# Patient Record
Sex: Female | Born: 1945
Health system: Southern US, Community
[De-identification: ages and names within clinical notes are randomized; demographics above are authoritative.]

## PROBLEM LIST (undated history)

## (undated) DIAGNOSIS — I471 Supraventricular tachycardia: Secondary | ICD-10-CM

## (undated) DIAGNOSIS — Z923 Personal history of irradiation: Secondary | ICD-10-CM

## (undated) DIAGNOSIS — Z9221 Personal history of antineoplastic chemotherapy: Secondary | ICD-10-CM

## (undated) DIAGNOSIS — M1990 Unspecified osteoarthritis, unspecified site: Secondary | ICD-10-CM

## (undated) DIAGNOSIS — T7840XA Allergy, unspecified, initial encounter: Secondary | ICD-10-CM

## (undated) DIAGNOSIS — M8580 Other specified disorders of bone density and structure, unspecified site: Secondary | ICD-10-CM

## (undated) DIAGNOSIS — I82409 Acute embolism and thrombosis of unspecified deep veins of unspecified lower extremity: Secondary | ICD-10-CM

## (undated) DIAGNOSIS — D689 Coagulation defect, unspecified: Secondary | ICD-10-CM

## (undated) DIAGNOSIS — J189 Pneumonia, unspecified organism: Secondary | ICD-10-CM

## (undated) DIAGNOSIS — Z9889 Other specified postprocedural states: Secondary | ICD-10-CM

## (undated) DIAGNOSIS — R112 Nausea with vomiting, unspecified: Secondary | ICD-10-CM

## (undated) DIAGNOSIS — D649 Anemia, unspecified: Secondary | ICD-10-CM

## (undated) DIAGNOSIS — I890 Lymphedema, not elsewhere classified: Secondary | ICD-10-CM

## (undated) DIAGNOSIS — K219 Gastro-esophageal reflux disease without esophagitis: Secondary | ICD-10-CM

## (undated) DIAGNOSIS — J45909 Unspecified asthma, uncomplicated: Secondary | ICD-10-CM

## (undated) DIAGNOSIS — I4719 Other supraventricular tachycardia: Secondary | ICD-10-CM

## (undated) DIAGNOSIS — Z8719 Personal history of other diseases of the digestive system: Secondary | ICD-10-CM

## (undated) DIAGNOSIS — I499 Cardiac arrhythmia, unspecified: Secondary | ICD-10-CM

## (undated) DIAGNOSIS — M719 Bursopathy, unspecified: Secondary | ICD-10-CM

## (undated) DIAGNOSIS — C50919 Malignant neoplasm of unspecified site of unspecified female breast: Secondary | ICD-10-CM

## (undated) DIAGNOSIS — Z9289 Personal history of other medical treatment: Secondary | ICD-10-CM

## (undated) DIAGNOSIS — T781XXA Other adverse food reactions, not elsewhere classified, initial encounter: Secondary | ICD-10-CM

## (undated) HISTORY — PX: CHALAZION EXCISION: SHX213

## (undated) HISTORY — DX: Malignant neoplasm of unspecified site of unspecified female breast: C50.919

## (undated) HISTORY — DX: Coagulation defect, unspecified: D68.9

## (undated) HISTORY — PX: TONSILLECTOMY: SUR1361

## (undated) HISTORY — DX: Acute embolism and thrombosis of unspecified deep veins of unspecified lower extremity: I82.409

## (undated) HISTORY — PX: DILATION AND CURETTAGE OF UTERUS: SHX78

## (undated) HISTORY — PX: TUBAL LIGATION: SHX77

## (undated) HISTORY — PX: MASTECTOMY MODIFIED RADICAL: SUR848

## (undated) HISTORY — PX: COLONOSCOPY: SHX174

## (undated) HISTORY — DX: Personal history of antineoplastic chemotherapy: Z92.21

## (undated) HISTORY — PX: BREAST BIOPSY: SHX20

---

## 1968-12-01 HISTORY — PX: TUMOR EXCISION: SHX421

## 1979-08-02 HISTORY — PX: CHALAZION EXCISION: SHX213

## 1998-06-07 ENCOUNTER — Other Ambulatory Visit: Admission: RE | Admit: 1998-06-07 | Discharge: 1998-06-07 | Payer: Self-pay | Admitting: Obstetrics and Gynecology

## 1999-08-29 ENCOUNTER — Other Ambulatory Visit: Admission: RE | Admit: 1999-08-29 | Discharge: 1999-08-29 | Payer: Self-pay | Admitting: *Deleted

## 1999-10-07 ENCOUNTER — Other Ambulatory Visit: Admission: RE | Admit: 1999-10-07 | Discharge: 1999-10-07 | Payer: Self-pay | Admitting: *Deleted

## 2000-09-18 ENCOUNTER — Other Ambulatory Visit: Admission: RE | Admit: 2000-09-18 | Discharge: 2000-09-18 | Payer: Self-pay | Admitting: *Deleted

## 2001-10-29 ENCOUNTER — Other Ambulatory Visit: Admission: RE | Admit: 2001-10-29 | Discharge: 2001-10-29 | Payer: Self-pay | Admitting: *Deleted

## 2003-02-20 ENCOUNTER — Other Ambulatory Visit: Admission: RE | Admit: 2003-02-20 | Discharge: 2003-02-20 | Payer: Self-pay | Admitting: Obstetrics and Gynecology

## 2012-05-04 DIAGNOSIS — N61 Mastitis without abscess: Secondary | ICD-10-CM | POA: Diagnosis not present

## 2012-05-19 ENCOUNTER — Encounter (HOSPITAL_COMMUNITY): Payer: Self-pay | Admitting: *Deleted

## 2012-05-19 ENCOUNTER — Emergency Department (HOSPITAL_COMMUNITY)
Admission: EM | Admit: 2012-05-19 | Discharge: 2012-05-19 | Disposition: A | Discharge status: Home / Self Care | Payer: Medicare Other | Attending: Emergency Medicine | Admitting: Emergency Medicine

## 2012-05-19 DIAGNOSIS — E860 Dehydration: Secondary | ICD-10-CM | POA: Diagnosis not present

## 2012-05-19 DIAGNOSIS — R002 Palpitations: Secondary | ICD-10-CM | POA: Diagnosis not present

## 2012-05-19 DIAGNOSIS — T887XXA Unspecified adverse effect of drug or medicament, initial encounter: Secondary | ICD-10-CM | POA: Diagnosis not present

## 2012-05-19 DIAGNOSIS — R112 Nausea with vomiting, unspecified: Secondary | ICD-10-CM | POA: Diagnosis not present

## 2012-05-19 DIAGNOSIS — R5381 Other malaise: Secondary | ICD-10-CM | POA: Diagnosis not present

## 2012-05-19 DIAGNOSIS — R5383 Other fatigue: Secondary | ICD-10-CM | POA: Diagnosis not present

## 2012-05-19 DIAGNOSIS — Z79899 Other long term (current) drug therapy: Secondary | ICD-10-CM | POA: Insufficient documentation

## 2012-05-19 DIAGNOSIS — D649 Anemia, unspecified: Secondary | ICD-10-CM | POA: Diagnosis not present

## 2012-05-19 DIAGNOSIS — D696 Thrombocytopenia, unspecified: Secondary | ICD-10-CM | POA: Insufficient documentation

## 2012-05-19 DIAGNOSIS — R197 Diarrhea, unspecified: Secondary | ICD-10-CM | POA: Diagnosis not present

## 2012-05-19 HISTORY — DX: Unspecified asthma, uncomplicated: J45.909

## 2012-05-19 LAB — BASIC METABOLIC PANEL
BUN: 13 mg/dL (ref 6–23)
Chloride: 92 mEq/L — ABNORMAL LOW (ref 96–112)
GFR calc Af Amer: >90 mL/min (ref >90)
Potassium: 3.1 mEq/L — ABNORMAL LOW (ref 3.5–5.1)
Sodium: 129 mEq/L — ABNORMAL LOW (ref 135–145)

## 2012-05-19 LAB — CBC
HCT: 33.6 % — ABNORMAL LOW (ref 36.0–46.0)
Platelets: 59 10*3/uL — ABNORMAL LOW (ref 150–400)
RDW: 12.5 % (ref 11.5–15.5)
WBC: 6.7 10*3/uL (ref 4.0–10.5)

## 2012-05-19 MED ORDER — POTASSIUM CHLORIDE CRYS ER 20 MEQ PO TBCR
40.0000 meq | EXTENDED_RELEASE_TABLET | Freq: Once | ORAL | Status: AC
  Administered 2012-05-19: 40 meq via ORAL
  Filled 2012-05-19: qty 2

## 2012-05-19 MED ORDER — SODIUM CHLORIDE 0.9 % IV BOLUS (SEPSIS)
2000.0000 mL | Freq: Once | INTRAVENOUS | Status: AC
  Administered 2012-05-19: 2000 mL via INTRAVENOUS

## 2012-05-19 MED ORDER — SODIUM CHLORIDE 0.9 % IV BOLUS (SEPSIS): 1000.0000 mL | Freq: Once | INTRAVENOUS | Status: DC

## 2012-05-19 MED ORDER — ONDANSETRON HCL 4 MG/2ML IJ SOLN
4.0000 mg | Freq: Once | INTRAMUSCULAR | Status: AC
  Administered 2012-05-19: 4 mg via INTRAVENOUS
  Filled 2012-05-19: qty 2

## 2012-05-19 MED ORDER — ONDANSETRON 8 MG PO TBDP: 8.0000 mg | ORAL_TABLET | Freq: Three times a day (TID) | ORAL | Status: DC | PRN

## 2012-05-19 NOTE — ED Notes (Signed)
Pt ambulatory without assistance to restroom. Pt family at bedside

## 2012-05-19 NOTE — ED Notes (Signed)
Pt reports that her PCP placed her on bactrim for breast infection. Pt was to take 10 days of this medication but could only tolerate 8 days. Pt stopped taking Bactrim last Wednesday and has felt generally unwell. Pt reports feeling tired and "foggy."  Pt reports N/V/D worse this weekend. Pt states she has diarrhea about two times a day,continues to be nauseated and has had a decreased appetite. Pt denies vomiting today. Pt reports diarrhea looks black, but pt has tried [employer] recently for symptoms.  Pt saw PCP this AM and is here for further work up and treatment. Pt reports breast infection has cleared. Pt denies cough, shortness of breath or headache.

## 2012-05-19 NOTE — ED Notes (Signed)
Pt reports n/v/d that began last Wednesday 05/12/2012. Reports taking Bactrum x8 days prior to n/v/d, believes this was a contributing factor to symptoms. Denies fever. Pepto-bismal taken without relief.

## 2012-05-19 NOTE — Discharge Instructions (Signed)
Dehydration, Adult Dehydration is when you lose more fluids from the body than you take in. Vital organs like the kidneys, brain, and heart cannot function without a proper amount of fluids and salt. Any loss of fluids from the body can cause dehydration.  CAUSES   Vomiting.   Diarrhea.   Excessive sweating.   Excessive urine output.   Fever.  SYMPTOMS  Mild dehydration  Thirst.   Dry lips.   Slightly dry mouth.  Moderate dehydration  Very dry mouth.   Sunken eyes.   Skin does not bounce back quickly when lightly pinched and released.   Dark urine and decreased urine production.   Decreased tear production.   Headache.  Severe dehydration  Very dry mouth.   Extreme thirst.   Rapid, weak pulse (more than 100 beats per minute at rest).   Cold hands and feet.   Not able to sweat in spite of heat and temperature.   Rapid breathing.   Blue lips.   Confusion and lethargy.   Difficulty being awakened.   Minimal urine production.   No tears.  DIAGNOSIS  Your caregiver will diagnose dehydration based on your symptoms and your exam. Blood and urine tests will help confirm the diagnosis. The diagnostic evaluation should also identify the cause of dehydration. TREATMENT  Treatment of mild or moderate dehydration can often be done at home by increasing the amount of fluids that you drink. It is best to drink small amounts of fluid more often. Drinking too much at one time can make vomiting worse. Refer to the home care instructions below. Severe dehydration needs to be treated at the hospital where you will probably be given intravenous (IV) fluids that contain water and electrolytes. HOME CARE INSTRUCTIONS   Ask your caregiver about specific rehydration instructions.   Drink enough fluids to keep your urine clear or pale yellow.   Drink small amounts frequently if you have nausea and vomiting.   Eat as you normally do.   Avoid:   Foods or drinks high in  sugar.   Carbonated drinks.   Juice.   Extremely hot or cold fluids.   Drinks with caffeine.   Fatty, greasy foods.   Alcohol.   Tobacco.   Overeating.   Gelatin desserts.   Wash your hands well to avoid spreading bacteria and viruses.   Only take over-the-counter or prescription medicines for pain, discomfort, or fever as directed by your caregiver.   Ask your caregiver if you should continue all prescribed and over-the-counter medicines.   Keep all follow-up appointments with your caregiver.  SEEK MEDICAL CARE IF:  You have abdominal pain and it increases or stays in one area (localizes).   You have a rash, stiff neck, or severe headache.   You are irritable, sleepy, or difficult to awaken.   You are weak, dizzy, or extremely thirsty.  SEEK IMMEDIATE MEDICAL CARE IF:   You are unable to keep fluids down or you get worse despite treatment.   You have frequent episodes of vomiting or diarrhea.   You have blood or green matter (bile) in your vomit.   You have blood in your stool or your stool looks black and tarry.   You have not urinated in 6 to 8 hours, or you have only urinated a small amount of very dark urine.   You have a fever.   You faint.  MAKE SURE YOU:   Understand these instructions.   Will watch your condition.     Will get help right away if you are not doing well or get worse.  Document Released: 11/17/2005 Document Revised: 11/06/2011 Document Reviewed: 07/07/2011 ExitCare Patient Information 2012 [employer]. 

## 2012-05-19 NOTE — ED Notes (Signed)
PO trial started. Pt given something to drink and saltine crackers. Will reassess,.

## 2012-05-19 NOTE — ED Provider Notes (Signed)
History     CSN: [id number]  Arrival date & time 05/19/12  1050   First MD Initiated Contact with Patient 05/19/12 1116      Chief Complaint  Patient presents with  . Emesis  . Diarrhea    HPI  Patient presents from her PCP with 10 days of malaise and 3-5 days of nausea, vomiting, and diarrhea. She was placed on 10 days of bactrim two weeks ago for a breast infection but stopped taking it on day 8/10 due to feeling unwell. The patient describes feeling head pressure and fogginess but no headache. She denies abdominal pain. She has been unable to eat or drink very much in the last several days.  She reports having had black stools in the last several days, but heme occult stool test was negative in PCP office this morning. The patient endorses having taken Pepto Bismol this past week.   Patient denies fever and night sweats, but endorses chills this weekend. She denies shortness of breath and chest pain. No dysuria, but "warmth" when she pees. Reports breast feels much better. Resolution of erythema of right breast per pt. Has scheduled mammogram of right breast. No hematemesis   Past Medical History  Diagnosis Date  . Asthma     History reviewed. No pertinent past surgical history.  No family history on file.  History  Substance Use Topics  . Smoking status: Never Smoker   . Smokeless tobacco: Not on file  . Alcohol Use: No    OB History    Grav Para Term Preterm Abortions TAB SAB Ect Mult Living                  Review of Systems  All other systems reviewed and are negative.    Allergies  Anesthetics, amide; Codeine phosphate; and Sulfa antibiotics  Home Medications   Current Outpatient Rx  Name Route Sig Dispense Refill  . OMEPRAZOLE 20 MG PO CPDR Oral Take 20 mg by mouth daily.    . SULFAMETHOXAZOLE-TMP DS 800-160 MG PO TABS Oral Take 1 tablet by mouth 2 (two) times daily.      BP 114/63  Pulse 102  Temp 98.5 F (36.9 C) (Oral)  Resp 18  SpO2  94%  Physical Exam  Constitutional: She is oriented to person, place, and time. She appears well-developed and well-nourished.       Pale appearing  HENT:  Head: Normocephalic and atraumatic.  Eyes: EOM are normal. Pupils are equal, round, and reactive to light.  Cardiovascular: Normal rate and regular rhythm.   Pulmonary/Chest: Effort normal and breath sounds normal.       Right breast without warmth, erythema, or drainage or fluctuance. No tenderness. Chaperone present for exam  Abdominal: Soft. Bowel sounds are normal. There is no tenderness.  Musculoskeletal: Normal range of motion.  Neurological: She is alert and oriented to person, place, and time.  Skin: Skin is warm and dry. There is pallor.    ED Course  Procedures (including critical care time)  Labs Reviewed  CBC - Abnormal; Notable for the following:    Hemoglobin 11.8 (*)     HCT 33.6 (*)     Platelets 59 (*)     All other components within normal limits  BASIC METABOLIC PANEL - Abnormal; Notable for the following:    Sodium 129 (*)     Potassium 3.1 (*)     Chloride 92 (*)     Glucose, Bld 100 (*)  GFR calc non Af Amer 87 (*)     All other components within normal limits   No results found.   1. Dehydration   2. Nausea and vomiting   3. Thrombocytopenia       MDM  The patient clinically presented with dehydration.  Her abdomen is benign.  She feels much better after 2 L of IV fluids.  She has a normal neurologic exam.  She does have thrombocytopenia however I think this is likely secondary to her back and use them because she has discontinued this I suspect this will begin to rise on its own.  She'll be asked to call hematologist for followup.  She's also been instructed to followup with her primary care Dr. in 2 days for repeat blood test to check her platelets.  Her stool is Hemoccult negative at the office today.  She's tolerating oral fluids in the emergency department.  Close PCP followup.  Home with  antinausea medicine.       Lyanne Co, MD 05/19/12 [phone]

## 2012-05-21 DIAGNOSIS — D696 Thrombocytopenia, unspecified: Secondary | ICD-10-CM | POA: Diagnosis not present

## 2012-05-21 DIAGNOSIS — G479 Sleep disorder, unspecified: Secondary | ICD-10-CM | POA: Diagnosis not present

## 2012-05-24 DIAGNOSIS — M79609 Pain in unspecified limb: Secondary | ICD-10-CM | POA: Diagnosis not present

## 2012-05-24 DIAGNOSIS — Z79899 Other long term (current) drug therapy: Secondary | ICD-10-CM | POA: Diagnosis not present

## 2012-05-24 DIAGNOSIS — D696 Thrombocytopenia, unspecified: Secondary | ICD-10-CM | POA: Diagnosis not present

## 2012-05-25 DIAGNOSIS — IMO0002 Reserved for concepts with insufficient information to code with codable children: Secondary | ICD-10-CM | POA: Diagnosis not present

## 2012-05-25 DIAGNOSIS — I998 Other disorder of circulatory system: Secondary | ICD-10-CM | POA: Diagnosis not present

## 2012-05-26 DIAGNOSIS — D696 Thrombocytopenia, unspecified: Secondary | ICD-10-CM | POA: Diagnosis not present

## 2012-05-26 DIAGNOSIS — I998 Other disorder of circulatory system: Secondary | ICD-10-CM | POA: Diagnosis not present

## 2012-05-26 DIAGNOSIS — T887XXA Unspecified adverse effect of drug or medicament, initial encounter: Secondary | ICD-10-CM | POA: Diagnosis not present

## 2012-05-26 DIAGNOSIS — R7989 Other specified abnormal findings of blood chemistry: Secondary | ICD-10-CM | POA: Diagnosis not present

## 2012-05-27 ENCOUNTER — Emergency Department (HOSPITAL_COMMUNITY): Payer: Medicare Other

## 2012-05-27 ENCOUNTER — Encounter (HOSPITAL_COMMUNITY): Payer: Self-pay | Admitting: Emergency Medicine

## 2012-05-27 ENCOUNTER — Emergency Department (HOSPITAL_COMMUNITY)
Admission: EM | Admit: 2012-05-27 | Discharge: 2012-05-27 | Disposition: A | Discharge status: Home / Self Care | Payer: Medicare Other | Attending: Emergency Medicine | Admitting: Emergency Medicine

## 2012-05-27 DIAGNOSIS — M79606 Pain in leg, unspecified: Secondary | ICD-10-CM

## 2012-05-27 DIAGNOSIS — M79609 Pain in unspecified limb: Secondary | ICD-10-CM | POA: Insufficient documentation

## 2012-05-27 DIAGNOSIS — R5381 Other malaise: Secondary | ICD-10-CM | POA: Diagnosis not present

## 2012-05-27 DIAGNOSIS — R230 Cyanosis: Secondary | ICD-10-CM | POA: Insufficient documentation

## 2012-05-27 DIAGNOSIS — R5383 Other fatigue: Secondary | ICD-10-CM | POA: Diagnosis not present

## 2012-05-27 DIAGNOSIS — K449 Diaphragmatic hernia without obstruction or gangrene: Secondary | ICD-10-CM | POA: Diagnosis not present

## 2012-05-27 DIAGNOSIS — IMO0001 Reserved for inherently not codable concepts without codable children: Secondary | ICD-10-CM | POA: Insufficient documentation

## 2012-05-27 DIAGNOSIS — R0989 Other specified symptoms and signs involving the circulatory and respiratory systems: Secondary | ICD-10-CM | POA: Diagnosis not present

## 2012-05-27 LAB — CBC WITH DIFFERENTIAL/PLATELET
Basophils Relative: 2 % — ABNORMAL HIGH (ref 0–1)
Eosinophils Relative: 1 % (ref 0–5)
HCT: 35 % — ABNORMAL LOW (ref 36.0–46.0)
Hemoglobin: 11.8 g/dL — ABNORMAL LOW (ref 12.0–15.0)
Lymphocytes Relative: 64 % — ABNORMAL HIGH (ref 12–46)
Monocytes Relative: 7 % (ref 3–12)
Neutro Abs: 2.1 10*3/uL (ref 1.7–7.7)
RBC: 3.97 MIL/uL (ref 3.87–5.11)
WBC: 8 10*3/uL (ref 4.0–10.5)

## 2012-05-27 LAB — COMPREHENSIVE METABOLIC PANEL
ALT: 207 U/L — ABNORMAL HIGH (ref 0–35)
AST: 107 U/L — ABNORMAL HIGH (ref 0–37)
Alkaline Phosphatase: 79 U/L (ref 39–117)
CO2: 26 mEq/L (ref 19–32)
GFR calc Af Amer: >90 mL/min (ref >90)
GFR calc non Af Amer: >90 mL/min (ref >90)
Glucose, Bld: 97 mg/dL (ref 70–99)
Potassium: 3.8 mEq/L (ref 3.5–5.1)
Sodium: 136 mEq/L (ref 135–145)

## 2012-05-27 MED ORDER — IOHEXOL 350 MG/ML SOLN
100.0000 mL | Freq: Once | INTRAVENOUS | Status: AC | PRN
  Administered 2012-05-27: 100 mL via INTRAVENOUS

## 2012-05-27 NOTE — ED Notes (Signed)
Per pt, was placed on bactrim for breast infection-had a severe reaction-caused right foot to turn black-woke up this am with right calf pain-PCP advised to be checked out for blot clot

## 2012-05-27 NOTE — ED Provider Notes (Signed)
History     CSN: [id number]  Arrival date & time 05/27/12  1145   First MD Initiated Contact with Patient 05/27/12 1156      Chief Complaint  Patient presents with  . DVT    (Consider location/radiation/quality/duration/timing/severity/associated sxs/prior treatment) HPI Comments: Pt has had a 2 week hx of generalized illness which she and her PMD at [facility] physicians have attributed to bactrim that she was previously on for a breast infection.  It started with "intense bone pain" to lower extremities, then progressed to n/v/d with elevated liver enzymes.  2 days ago, noticed purple discoloration to little toe of right foot.  Today, started having some pain to calf, only when ambulating.  Was told to come here to r/o DVT.  Pt denies any CP or SOB.  No swelling to leg.  No hx of a-fib, does have hx of PAT.  The history is provided by the patient.    Past Medical History  Diagnosis Date  . Asthma     History reviewed. No pertinent past surgical history.  No family history on file.  History  Substance Use Topics  . Smoking status: Never Smoker   . Smokeless tobacco: Not on file  . Alcohol Use: No    OB History    Grav Para Term Preterm Abortions TAB SAB Ect Mult Living                  Review of Systems  Constitutional: Positive for fatigue. Negative for fever, chills and diaphoresis.  HENT: Negative for congestion, rhinorrhea and sneezing.   Eyes: Negative.   Respiratory: Negative for cough, chest tightness and shortness of breath.   Cardiovascular: Negative for chest pain and leg swelling.  Gastrointestinal: Negative for nausea, vomiting, abdominal pain, diarrhea and blood in stool.  Genitourinary: Negative for frequency, hematuria, flank pain and difficulty urinating.  Musculoskeletal: Positive for myalgias. Negative for back pain and arthralgias.  Skin: Negative for rash.  Neurological: Negative for dizziness, speech difficulty, weakness, numbness and headaches.     Allergies  Anesthetics, amide; Codeine phosphate; and Sulfa antibiotics  Home Medications   Current Outpatient Rx  Name Route Sig Dispense Refill  . ALBUTEROL SULFATE HFA 108 (90 BASE) MCG/ACT IN AERS Inhalation Inhale 2 puffs into the lungs every 6 (six) hours as needed.    . ASPIRIN EC 81 MG PO TBEC Oral Take 162 mg by mouth every 6 (six) hours as needed. For blood clot symptoms    . CALCIUM CARBONATE-VITAMIN D 500-200 MG-UNIT PO TABS Oral Take 1 tablet by mouth daily.    . OMEGA-3 FATTY ACIDS 1000 MG PO CAPS Oral Take 2 g by mouth daily.    Marland Kitchen FLAX SEEDS PO Oral Take 1 tablet by mouth daily.    Marland Kitchen FLUTICASONE PROPIONATE 50 MCG/ACT NA SUSP Nasal Place 1 spray into the nose daily.    Marland Kitchen GLUCOSAMINE-CHONDROITIN 500-400 MG PO TABS Oral Take 1 tablet by mouth daily.    . ARNICARE EX GEL Apply externally Apply 1 application topically as needed.    . ADULT MULTIVITAMIN W/MINERALS CH Oral Take 1 tablet by mouth daily.    Marland Kitchen OMEPRAZOLE 20 MG PO CPDR Oral Take 20 mg by mouth daily.    . SULFAMETHOXAZOLE-TMP DS 800-160 MG PO TABS Oral Take 1 tablet by mouth 2 (two) times daily.      BP 153/92  Pulse 114  Temp 98.2 F (36.8 C) (Oral)  Resp 19  Ht 5\' 1"  (1.549  m)  Wt 140 lb (63.504 kg)  BMI 26.45 kg/m2  SpO2 99%  Physical Exam  Constitutional: She is oriented to person, place, and time. She appears well-developed and well-nourished.  HENT:  Head: Normocephalic and atraumatic.  Eyes: Pupils are equal, round, and reactive to light.  Neck: Normal range of motion. Neck supple.  Cardiovascular: Normal rate, regular rhythm and normal heart sounds.   Pulmonary/Chest: Effort normal and breath sounds normal. No respiratory distress. She has no wheezes. She has no rales. She exhibits no tenderness.  Abdominal: Soft. Bowel sounds are normal. There is no tenderness. There is no rebound and no guarding.  Musculoskeletal: Normal range of motion. She exhibits no edema.       Some blue/purple  discoloration to 5th digit of right foot, also, similar discoloration to 5th digit of left foot, but not as prominent.  Good pedal pulses.  Normal sensation to foot.  Normal motor function to foot.  No edema or pain on palpation of lower legs.  Lymphadenopathy:    She has no cervical adenopathy.  Neurological: She is alert and oriented to person, place, and time.  Skin: Skin is warm and dry. No rash noted.  Psychiatric: She has a normal mood and affect.    ED Course  Procedures (including critical care time)   Date: 05/27/2012  Rate: 99  Rhythm: normal sinus rhythm  QRS Axis: normal  Intervals: normal  ST/T Wave abnormalities: nonspecific ST/T changes  Conduction Disutrbances:none  Narrative Interpretation:   Old EKG Reviewed: none available Results for orders placed during the hospital encounter of 05/27/12  CBC WITH DIFFERENTIAL      Component Value Range   WBC 8.0  4.0 - 10.5 K/uL   RBC 3.97  3.87 - 5.11 MIL/uL   Hemoglobin 11.8 (*) 12.0 - 15.0 g/dL   HCT 29.5 (*) 62.1 - 30.8 %   MCV 88.2  78.0 - 100.0 fL   MCH 29.7  26.0 - 34.0 pg   MCHC 33.7  30.0 - 36.0 g/dL   RDW 65.7  84.6 - 96.2 %   Platelets    150 - 400 K/uL   Value: PLATELET CLUMPS NOTED ON SMEAR, COUNT APPEARS ADEQUATE   Neutrophils Relative 26 (*) 43 - 77 %   Lymphocytes Relative 64 (*) 12 - 46 %   Monocytes Relative 7  3 - 12 %   Eosinophils Relative 1  0 - 5 %   Basophils Relative 2 (*) 0 - 1 %   Neutro Abs 2.1  1.7 - 7.7 K/uL   Lymphs Abs 5.0 (*) 0.7 - 4.0 K/uL   Monocytes Absolute 0.6  0.1 - 1.0 K/uL   Eosinophils Absolute 0.1  0.0 - 0.7 K/uL   Basophils Absolute 0.2 (*) 0.0 - 0.1 K/uL   WBC Morphology MILD LEFT SHIFT (1-5% METAS, OCC MYELO, OCC BANDS)     Smear Review       Value: PLATELET CLUMPS NOTED ON SMEAR, COUNT APPEARS ADEQUATE  COMPREHENSIVE METABOLIC PANEL      Component Value Range   Sodium 136  135 - 145 mEq/L   Potassium 3.8  3.5 - 5.1 mEq/L   Chloride 101  96 - 112 mEq/L   CO2 26  19  - 32 mEq/L   Glucose, Bld 97  70 - 99 mg/dL   BUN 11  6 - 23 mg/dL   Creatinine, Ser 9.52  0.50 - 1.10 mg/dL   Calcium 8.9  8.4 - 84.1 mg/dL  Total Protein 7.0  6.0 - 8.3 g/dL   Albumin 3.5  3.5 - 5.2 g/dL   AST 161 (*) 0 - 37 U/L   ALT 207 (*) 0 - 35 U/L   Alkaline Phosphatase 79  39 - 117 U/L   Total Bilirubin 0.4  0.3 - 1.2 mg/dL   GFR calc non Af Amer >90  >90 mL/min   GFR calc Af Amer >90  >90 mL/min   No results found.    No diagnosis found.    MDM  Pt with cyanosis to both 5th digits, concerning for possible arterial thrombi.  Pulses intact.  Will check ABI.  Consulted Dr. Edilia Bo who will see pt.    ABI on right is 1.03, 0.91 on left  14:46: pt has been seen by Dr. Edilia Bo who ordered CT angio of legs.  Awaiting this.  Pt turned over to DR. [facility] pending CT angio    Rolan Bucco, MD 05/27/12 [phone]

## 2012-05-27 NOTE — Progress Notes (Signed)
VASCULAR LAB PRELIMINARY  ARTERIAL  ABI completed:    RIGHT    LEFT    PRESSURE WAVEFORM  PRESSURE WAVEFORM  BRACHIAL 136 Triphasic BRACHIAL 131 Triphasic  DP 169 Triphasic DP 175 Triphasic         PT 184 Triphasic PT 173 Triphasic                  RIGHT LEFT  ABI 1.35 1.26    ABIs and Doppler waveforms are within normal limits at rest  Antionette Luster, 05/27/2012, 3:02 PM

## 2012-05-27 NOTE — ED Provider Notes (Signed)
Review of the CT scan was negative.  I discussed the case with Dr. Edilia Bo the vascular surgeon.  He recommends patient followup with her family physician for further workup and evaluation.  This may be some type of vasculitis or some other medical problem that is not obvious at the present time.  Nelia Shi, MD 05/27/12 [phone]

## 2012-05-27 NOTE — Discharge Instructions (Signed)
Your CT scan showed no evidence of blood clots.  I spoke with the vascular surgeon who recommend you followup with your family physician.Marland Kitchen  He also have mildly elevated liver enzymes which need to be followed along.  Please return to emergency department should new symptoms occur or if you're concerned for any other changes.

## 2012-05-27 NOTE — Consult Note (Signed)
[facility]  Patient name: Jessica Wells MRN: [id number] DOB: Aug 04, 1946 Sex: female  REASON FOR CONSULT: Bluish discoloration of right fifth toe. Consult is from the [facility] emergency department.  HPI: Jessica Wells is a 66 y.o. female who 2 days ago noted the sudden onset of bluish discoloration of her right fifth toe. She notes that there was no trauma to her foot and she has not recently had any new poorly fitting shoes. She denies any history of claudication, rest pain, or nonhealing ulcers. Her history is significant for recent problems related to Bactrim which she was receiving for a breast infection. She states that she has had multiple complications related to this medication which is been discontinued. Most of the symptoms have been resolving now that the medicine has been discontinued. However given her recent problems she was very concerned about the discoloration of the toe and presented to the [facility] long emergency department.  Past Medical History  Diagnosis Date  . Asthma   she denies any history of diabetes, hypertension, hypercholesterolemia, history of previous myocardial infarction, history of congestive heart failure, history of arrhythmias, history of COPD.  No family history on file. her mother had coronary artery disease in her 22s. She is unaware of any other history of premature cardiovascular disease.  SOCIAL HISTORY: History  Substance Use Topics  . Smoking status: Never Smoker   . Smokeless tobacco: Not on file  . Alcohol Use: No  she is single. She has one son.  Allergies  Allergen Reactions  . Anesthetics, Amide Nausea And Vomiting  . Codeine Phosphate Nausea And Vomiting  . Sulfa Antibiotics Nausea And Vomiting    Crazy feeling in head    No current facility-administered medications for this encounter.   Current Outpatient Prescriptions  Medication Sig Dispense Refill  . albuterol (PROVENTIL HFA;VENTOLIN HFA) 108 (90  BASE) MCG/ACT inhaler Inhale 2 puffs into the lungs every 6 (six) hours as needed.      Marland Kitchen aspirin EC 81 MG tablet Take 162 mg by mouth every 6 (six) hours as needed. For blood clot symptoms      . calcium-vitamin D (OSCAL WITH D) 500-200 MG-UNIT per tablet Take 1 tablet by mouth daily.      . fish oil-omega-3 fatty acids 1000 MG capsule Take 2 g by mouth daily.      . Flaxseed, Linseed, (FLAX SEEDS PO) Take 1 tablet by mouth daily.      . fluticasone (FLONASE) 50 MCG/ACT nasal spray Place 1 spray into the nose daily.      Marland Kitchen glucosamine-chondroitin 500-400 MG tablet Take 1 tablet by mouth daily.      . Homeopathic Products ([employer]) GEL Apply 1 application topically as needed.      . Multiple Vitamin (MULTIVITAMIN WITH MINERALS) TABS Take 1 tablet by mouth daily.      Marland Kitchen omeprazole (PRILOSEC) 20 MG capsule Take 20 mg by mouth daily.      Marland Kitchen sulfamethoxazole-trimethoprim (BACTRIM DS) 800-160 MG per tablet Take 1 tablet by mouth 2 (two) times daily.        REVIEW OF SYSTEMS: Arly.Keller ] denotes positive finding; [  ] denotes negative finding CARDIOVASCULAR:  [ ]  chest pain   [ ]  chest pressure   [ ]  palpitations   [ ]  orthopnea   [ ]  dyspnea on exertion   [ ]  claudication   [ ]  rest pain   [ ]  DVT   [ ]  phlebitis PULMONARY:   [ ]   productive cough   Arly.Keller ] asthma- as a child   [ ]  wheezing NEUROLOGIC:   [ ]  weakness  [ ]  paresthesias  [ ]  aphasia  [ ]  amaurosis  [ ]  dizziness HEMATOLOGIC:   [ ]  bleeding problems   [ ]  clotting disorders MUSCULOSKELETAL:  Arly.Keller ] joint pain   [ ]  joint swelling [ ]  leg swelling GASTROINTESTINAL: [ ]   blood in stool  [ ]   hematemesis GENITOURINARY:  [ ]   dysuria  [ ]   hematuria PSYCHIATRIC:  [ ]  history of major depression INTEGUMENTARY:  [ ]  rashes  [ ]  ulcers CONSTITUTIONAL:  [ ]  fever   [ ]  chills  PHYSICAL EXAM: Filed Vitals:   05/27/12 1152  BP: 153/92  Pulse: 114  Temp: 98.2 F (36.8 C)  TempSrc: Oral  Resp: 19  Height: 5\' 1"  (1.549 m)  Weight: 140 lb (63.504  kg)  SpO2: 99%   Body mass index is 26.45 kg/(m^2). GENERAL: The patient is a well-nourished female, in no acute distress. The vital signs are documented above. CARDIOVASCULAR: There is a regular rate and rhythm without significant murmur appreciated. I do not detect carotid bruits. She has palpable femoral, popliteal, dorsalis pedis, and posterior tibial pulses bilaterally. Popliteal pulses are slightly prominent. PULMONARY: There is good air exchange bilaterally without wheezing or rales. ABDOMEN: Soft and non-tender with normal pitched bowel sounds. I cannot appreciate an abdominal aortic aneurysm. MUSCULOSKELETAL: There are no major deformities.she has bluish discoloration of her right fifth toe. I do not see any other toes with discoloration. NEUROLOGIC: No focal weakness or paresthesias are detected. SKIN: There are no ulcers or rashes noted. PSYCHIATRIC: The patient has a normal affect.  DATA:  Lab Results  Component Value Date   WBC 8.0 05/27/2012   HGB 11.8* 05/27/2012   HCT 35.0* 05/27/2012   MCV 88.2 05/27/2012   PLT PLATELET CLUMPS NOTED ON SMEAR, COUNT APPEARS ADEQUATE 05/27/2012   Lab Results  Component Value Date   NA 136 05/27/2012   K 3.8 05/27/2012   CL 101 05/27/2012   CO2 26 05/27/2012   Lab Results  Component Value Date   CREATININE 0.57 05/27/2012   Arterial Doppler studies have been ordered and are pending.  MEDICAL ISSUES: This patient has bluish discoloration of the right fifth toe. Atheroembolic disease would be in the differential diagnosis. I cannot palpate an abdominal aortic aneurysm. She does have slightly prominent popliteal pulses. I have ordered a CT angiogram of the aorta with bilateral runoff to rule out a source of atheroembolic disease. We could obtain an ultrasound to rule out an abdominal aortic aneurysm and bilateral popliteal artery aneurysms, however this would not allow [place] to look for atherosclerotic disease or ulceration which could explain  embolic disease. Likewise, given her recent issues with thrombocytopenia and complications of Bactrim I reluctant to consider invasive procedure such as an arteriogram to rule out an atherosclerotic plaque which could explain atheroembolic disease. I think the CT angiogram would give the most information. If this test is unremarkable then I would not recommend further aggressive workup from a vascular standpoint although the medical service can consider looking into other potential causes such as vasculitis. Except for her family history of premature cardiovascular disease she has no other significant risk factors for vascular disease. I do not think a DVT would present as isolated discoloration of the right fifth toe although she has had some pain along the lateral aspect of both legs and consideration could  be given to a venous duplex if his pain persists. I would be happy to follow up with the patient as an outpatient any further vascular issues arise.  Thandiwe Siragusa S [facility]: [phone]

## 2012-06-08 DIAGNOSIS — H2510 Age-related nuclear cataract, unspecified eye: Secondary | ICD-10-CM | POA: Diagnosis not present

## 2012-06-10 DIAGNOSIS — D696 Thrombocytopenia, unspecified: Secondary | ICD-10-CM | POA: Diagnosis not present

## 2012-06-16 DIAGNOSIS — N61 Mastitis without abscess: Secondary | ICD-10-CM | POA: Diagnosis not present

## 2012-06-16 DIAGNOSIS — E878 Other disorders of electrolyte and fluid balance, not elsewhere classified: Secondary | ICD-10-CM | POA: Diagnosis not present

## 2012-06-25 DIAGNOSIS — N61 Mastitis without abscess: Secondary | ICD-10-CM | POA: Diagnosis not present

## 2012-07-01 DIAGNOSIS — N61 Mastitis without abscess: Secondary | ICD-10-CM | POA: Diagnosis not present

## 2012-07-12 ENCOUNTER — Encounter (INDEPENDENT_AMBULATORY_CARE_PROVIDER_SITE_OTHER): Payer: Self-pay | Admitting: General Surgery

## 2012-07-12 ENCOUNTER — Ambulatory Visit (INDEPENDENT_AMBULATORY_CARE_PROVIDER_SITE_OTHER): Payer: Medicare Other | Admitting: General Surgery

## 2012-07-12 VITALS — BP 158/62 | HR 72 | Temp 98.4°F | Resp 12 | Ht 61.5 in | Wt 140.6 lb

## 2012-07-12 DIAGNOSIS — N61 Mastitis without abscess: Secondary | ICD-10-CM

## 2012-07-12 DIAGNOSIS — N611 Abscess of the breast and nipple: Secondary | ICD-10-CM

## 2012-07-12 DIAGNOSIS — N6009 Solitary cyst of unspecified breast: Secondary | ICD-10-CM | POA: Diagnosis not present

## 2012-07-12 DIAGNOSIS — N63 Unspecified lump in unspecified breast: Secondary | ICD-10-CM | POA: Diagnosis not present

## 2012-07-12 DIAGNOSIS — N644 Mastodynia: Secondary | ICD-10-CM | POA: Diagnosis not present

## 2012-07-12 NOTE — Progress Notes (Signed)
Subjective:     Patient ID: Jessica Wells, female   DOB: [birth year], 66 y.o.   MRN: [id number]  HPI 49 yof who is otherwise healthy with no prior breast history who presents after in late May developing right breast tenderness and pain.  This was treated with bactrim and she had a life threatening reaction per her report requiring two hospital admissions.  The breast symptoms had resolved but then recurred in early July. She states she had a repeat u/s in June that was normal.  She was then treated for 2 weeks with doxy.  This got a little better and then was treated with clindamycin.  She then presented back to [facility] for follow up today.  She underwent u/s today with a four cm abscess or possible phlegmon under nipple. She was referred by Dr. Yolanda Bonine for evaluation  Review of Systems  Constitutional: Negative for fever, chills and unexpected weight change.  HENT: Negative for hearing loss, congestion, sore throat, trouble swallowing and voice change.   Eyes: Negative for visual disturbance.  Respiratory: Negative for cough and wheezing.   Cardiovascular: Negative for chest pain, palpitations and leg swelling.  Gastrointestinal: Negative for nausea, vomiting, abdominal pain, diarrhea, constipation, blood in stool, abdominal distention and anal bleeding.  Genitourinary: Negative for hematuria, vaginal bleeding and difficulty urinating.  Musculoskeletal: Negative for arthralgias.  Skin: Negative for rash and wound.  Neurological: Negative for seizures, syncope and headaches.  Hematological: Negative for adenopathy. Does not bruise/bleed easily.  Psychiatric/Behavioral: Negative for confusion.       Objective:   Physical Exam  Vitals reviewed. Constitutional: She appears well-developed and well-nourished.  Pulmonary/Chest: Right breast exhibits skin change (erythema in central breast surrounding the nipple areolar complex, there is firm area in central breast that is tender but no discrete mass)  and tenderness. Right breast exhibits no inverted nipple and no nipple discharge.       Assessment:     Right breast infection    Plan:     This appears to be chronic abscess.  She was given rx for levaquin and she is starting today.  I am going to have Dr Yolanda Bonine attempt to aspirate as much as she can if this is possible.  I think she will likely require operative drainage but will attempt conservative therapy and see back on Friday.

## 2012-07-13 ENCOUNTER — Telehealth (INDEPENDENT_AMBULATORY_CARE_PROVIDER_SITE_OTHER): Payer: Self-pay | Admitting: General Surgery

## 2012-07-13 NOTE — Telephone Encounter (Signed)
Spoke with pt to inform her that she has an appt with [facility] to have her [place] of breast tomorrow 8/14 and to arrive at 12:45.

## 2012-07-14 ENCOUNTER — Other Ambulatory Visit: Payer: Self-pay | Admitting: Radiology

## 2012-07-14 ENCOUNTER — Telehealth (INDEPENDENT_AMBULATORY_CARE_PROVIDER_SITE_OTHER): Payer: Self-pay

## 2012-07-14 DIAGNOSIS — N63 Unspecified lump in unspecified breast: Secondary | ICD-10-CM | POA: Diagnosis not present

## 2012-07-14 DIAGNOSIS — N644 Mastodynia: Secondary | ICD-10-CM | POA: Diagnosis not present

## 2012-07-14 DIAGNOSIS — N6009 Solitary cyst of unspecified breast: Secondary | ICD-10-CM | POA: Diagnosis not present

## 2012-07-14 DIAGNOSIS — L0100 Impetigo, unspecified: Secondary | ICD-10-CM | POA: Diagnosis not present

## 2012-07-14 DIAGNOSIS — N61 Mastitis without abscess: Secondary | ICD-10-CM | POA: Diagnosis not present

## 2012-07-14 DIAGNOSIS — C50919 Malignant neoplasm of unspecified site of unspecified female breast: Secondary | ICD-10-CM | POA: Diagnosis not present

## 2012-07-14 HISTORY — DX: Malignant neoplasm of unspecified site of unspecified female breast: C50.919

## 2012-07-14 NOTE — Telephone Encounter (Signed)
Shanda Bumps w/[facility] called to ask if we wanted cultures ran on the abscess that was going to be drained today along with the area is not better from being seen on Monday. Per Dr Dwain Sarna he does want the pt to continue with the drainage today at [facility] with them obtaining the culture and he will see the pt back on Friday

## 2012-07-15 ENCOUNTER — Other Ambulatory Visit: Payer: Self-pay | Admitting: Radiology

## 2012-07-15 ENCOUNTER — Telehealth (INDEPENDENT_AMBULATORY_CARE_PROVIDER_SITE_OTHER): Payer: Self-pay

## 2012-07-15 ENCOUNTER — Ambulatory Visit (INDEPENDENT_AMBULATORY_CARE_PROVIDER_SITE_OTHER): Payer: Medicare Other | Admitting: General Surgery

## 2012-07-15 ENCOUNTER — Encounter (INDEPENDENT_AMBULATORY_CARE_PROVIDER_SITE_OTHER): Payer: Self-pay | Admitting: General Surgery

## 2012-07-15 VITALS — BP 144/82 | HR 78 | Resp 18 | Ht 61.5 in | Wt 140.0 lb

## 2012-07-15 DIAGNOSIS — C50911 Malignant neoplasm of unspecified site of right female breast: Secondary | ICD-10-CM

## 2012-07-15 DIAGNOSIS — C50119 Malignant neoplasm of central portion of unspecified female breast: Secondary | ICD-10-CM

## 2012-07-15 DIAGNOSIS — C50919 Malignant neoplasm of unspecified site of unspecified female breast: Secondary | ICD-10-CM | POA: Diagnosis not present

## 2012-07-15 NOTE — Telephone Encounter (Signed)
Called Jessica Wells to notify her that she has an appt with Dr Welton Flakes for 07/19/12 arrive at 3:30pm for 4:00pm.

## 2012-07-15 NOTE — Progress Notes (Signed)
Subjective:     Patient ID: Jessica Wells, female   DOB: 09/16/1946, 66 y.o.   MRN: [id number]  HPI 66 yof who I saw on Monday for possible right breast abscess.  She has been treated for infection but not any better really.  She returned to see radiology this week after I saw her with showed definitive focus of mixed density centrally in the retroarealar area at 12 o clock.  Maximum dimension is 3.6 cm.  She underwent a core biopsy of this area.  This shows invasive mammary carcinoma with LVI.  This appears to be grade III.  Review of Systems     Objective:   Physical Exam unchanged    Assessment:     Right breast cancer    Plan:      We discussed the staging and pathophysiology of breast cancer. We discussed all of the different options for treatment for breast cancer including surgery, chemotherapy, radiation therapy, Herceptin, and antiestrogen therapy.  We need more information prior to determining therapy.  I am concerned about inflammatory cancer.  Plan is as follows: 1. Punch biopsy skin- I anesthetized the skin of breast where it is most red after cleansing.  I did a 5mm punch biopsy and closed this with 4-0 nylon.  Dressing placed.  Path pending. 2. MRI breast due Monday at 8 am 3. Await path markers 4. Oncology appt asap, I think she is likely candidate for primary systemic therapy 5. Genetics referral given fh 6. I will see back next week        

## 2012-07-16 ENCOUNTER — Other Ambulatory Visit: Payer: Self-pay | Admitting: Oncology

## 2012-07-16 ENCOUNTER — Encounter (INDEPENDENT_AMBULATORY_CARE_PROVIDER_SITE_OTHER): Payer: BLUE CROSS/BLUE SHIELD | Admitting: General Surgery

## 2012-07-16 DIAGNOSIS — C50119 Malignant neoplasm of central portion of unspecified female breast: Secondary | ICD-10-CM

## 2012-07-19 ENCOUNTER — Encounter: Payer: Self-pay | Admitting: Oncology

## 2012-07-19 ENCOUNTER — Ambulatory Visit: Payer: Medicare Other

## 2012-07-19 ENCOUNTER — Ambulatory Visit
Admission: RE | Admit: 2012-07-19 | Discharge: 2012-07-19 | Disposition: A | Discharge status: Home / Self Care | Payer: Medicare Other | Source: Ambulatory Visit | Attending: Radiology | Admitting: Radiology

## 2012-07-19 ENCOUNTER — Other Ambulatory Visit (HOSPITAL_BASED_OUTPATIENT_CLINIC_OR_DEPARTMENT_OTHER): Payer: Medicare Other | Admitting: Lab

## 2012-07-19 ENCOUNTER — Ambulatory Visit (HOSPITAL_BASED_OUTPATIENT_CLINIC_OR_DEPARTMENT_OTHER): Payer: Medicare Other | Admitting: Oncology

## 2012-07-19 VITALS — BP 173/84 | HR 97 | Temp 98.3°F | Resp 18 | Ht 61.5 in | Wt 142.2 lb

## 2012-07-19 DIAGNOSIS — C50919 Malignant neoplasm of unspecified site of unspecified female breast: Secondary | ICD-10-CM

## 2012-07-19 DIAGNOSIS — D493 Neoplasm of unspecified behavior of breast: Secondary | ICD-10-CM | POA: Diagnosis not present

## 2012-07-19 DIAGNOSIS — C50119 Malignant neoplasm of central portion of unspecified female breast: Secondary | ICD-10-CM

## 2012-07-19 DIAGNOSIS — R599 Enlarged lymph nodes, unspecified: Secondary | ICD-10-CM | POA: Diagnosis not present

## 2012-07-19 DIAGNOSIS — N6459 Other signs and symptoms in breast: Secondary | ICD-10-CM | POA: Diagnosis not present

## 2012-07-19 DIAGNOSIS — C50911 Malignant neoplasm of unspecified site of right female breast: Secondary | ICD-10-CM

## 2012-07-19 DIAGNOSIS — C773 Secondary and unspecified malignant neoplasm of axilla and upper limb lymph nodes: Secondary | ICD-10-CM

## 2012-07-19 LAB — COMPREHENSIVE METABOLIC PANEL
AST: 17 U/L (ref 0–37)
Albumin: 3.9 g/dL (ref 3.5–5.2)
BUN: 15 mg/dL (ref 6–23)
CO2: 27 mEq/L (ref 19–32)
Calcium: 9.3 mg/dL (ref 8.4–10.5)
Chloride: 103 mEq/L (ref 96–112)
Glucose, Bld: 121 mg/dL — ABNORMAL HIGH (ref 70–99)
Potassium: 3.7 mEq/L (ref 3.5–5.3)

## 2012-07-19 LAB — CBC WITH DIFFERENTIAL/PLATELET
BASO%: 0.7 % (ref 0.0–2.0)
Basophils Absolute: 0 10*3/uL (ref 0.0–0.1)
EOS%: 3 % (ref 0.0–7.0)
HGB: 12.9 g/dL (ref 11.6–15.9)
MCH: 29.9 pg (ref 25.1–34.0)
MONO#: 0.4 10*3/uL (ref 0.1–0.9)
RDW: 13.5 % (ref 11.2–14.5)
WBC: 6.5 10*3/uL (ref 3.9–10.3)
lymph#: 2.4 10*3/uL (ref 0.9–3.3)

## 2012-07-19 MED ORDER — GADOBENATE DIMEGLUMINE 529 MG/ML IV SOLN
14.0000 mL | Freq: Once | INTRAVENOUS | Status: AC | PRN
  Administered 2012-07-19: 14 mL via INTRAVENOUS

## 2012-07-19 NOTE — Progress Notes (Signed)
Jessica Wells [id number] 1946/02/01 66 y.o. 07/19/2012 4:09 PM  CC  [name], MD [address] [place] [address] Dr. Emelia Loron   REASON FOR CONSULTATION:  66 year old female with new diagnosis of breast cancer most likely inflammatory here for discussion of treatment options possibly neoadjuvant chemotherapy.  STAGE:  Inflammatory breast cancer  REFERRING PHYSICIAN: Dr. Emelia Loron  HISTORY OF PRESENT ILLNESS:  Jessica Wells is a 66 y.o. female.  Without significant medical problems except for asthma. Patient developed a possible right breast abscess. She was initially treated for infection but it did not improve. She subsequently had a mammogram performed that showed an abnormality. She was seen by Dr. Emelia Loron who noted that the area showed focus of mixed density centrally in the retroareolar area at the 1:00 incision maximum dimension of 3.6 cm. She had a core biopsy performed that showed a invasive mammary carcinoma with lymphovascular invasion grade 3. Also on exam patient was noted to have some skin changes. Because of this a skin biopsy  was performed. The skin biopsy results showed an invasive mammary carcinoma. The prognostic panel is not back yet. Patient is now seen in medical oncology for discussion of treatment not options specifically neoadjuvant chemotherapy. Otherwise patient is without any complaints. Her case will be discussed at the multidisciplinary breast conference in one week's time.   Past Medical History: Past Medical History  Diagnosis Date  . Asthma   Osteopenia Past Surgical History: Past Surgical History  Procedure Date  . Dilation and curettage of uterus     x3  . Tonsillectomy   right arm giant cell tumor 1970  Family History: Family History  Problem Relation Age of Onset  . Diabetes Mother   . Heart disease Mother   . Hypertension Mother   . Hyperlipidemia Mother   . Cancer Mother     breast  .  Heart disease Maternal Aunt   . Heart disease Maternal Uncle   . Heart disease Maternal Grandmother   father with lung cancer  Social History History  Substance Use Topics  . Smoking status: Former Games developer  . Smokeless tobacco: Never Used  . Alcohol Use: No  former smoker  Allergies: Allergies  Allergen Reactions  . Anesthetics, Amide Nausea And Vomiting    Projectile vomitting-Nausea- with 24hrs of dry heaves. With any anesthetics  . Sulfa Antibiotics Nausea And Vomiting and Other (See Comments)    Crazy feeling in head Life threatening reaction, decreased blood counts  . Codeine Phosphate Nausea And Vomiting    Current Medications: Current Outpatient Prescriptions  Medication Sig Dispense Refill  . albuterol (PROVENTIL HFA;VENTOLIN HFA) 108 (90 BASE) MCG/ACT inhaler Inhale 2 puffs into the lungs every 6 (six) hours as needed.      Marland Kitchen aspirin EC 81 MG tablet Take 162 mg by mouth every 6 (six) hours as needed. For blood clot symptoms      . calcium-vitamin D (OSCAL WITH D) 500-200 MG-UNIT per tablet Take 1 tablet by mouth daily.      . fish oil-omega-3 fatty acids 1000 MG capsule Take 2 g by mouth daily.      . Flaxseed, Linseed, (FLAX SEEDS PO) Take 1 tablet by mouth daily.      . fluticasone (FLONASE) 50 MCG/ACT nasal spray Place 1 spray into the nose daily.      Marland Kitchen glucosamine-chondroitin 500-400 MG tablet Take 1 tablet by mouth daily.      . Homeopathic Products (ARNICARE) GEL Apply 1  application topically as needed.      . Multiple Vitamin (MULTIVITAMIN WITH MINERALS) TABS Take 1 tablet by mouth daily.      Marland Kitchen omeprazole (PRILOSEC) 20 MG capsule Take 20 mg by mouth daily.       No current facility-administered medications for this visit.   Facility-Administered Medications Ordered in Other Visits  Medication Dose Route Frequency Provider Last Rate Last Dose  . gadobenate dimeglumine (MULTIHANCE) injection 14 mL  14 mL Intravenous Once PRN Medication Radiologist, MD   14 mL  at 07/19/12 0929    OB/GYN History: menarche at 25, meopause at 70, HRT used for 4 years, G1P1 at age 17  Fertility Discussion: N/A Prior History of Cancer: none  Health Maintenance:  Colonoscopy 2 - 3 years Bone Density due for 0ne in september Last PAP smear 2 years ago  ECOG PERFORMANCE STATUS: 1 - Symptomatic but completely ambulatory  Genetic Counseling/testing: refer to [facility] for genetic counseling  REVIEW OF SYSTEMS:  Constitutional: negative Ears, nose, mouth, throat, and face: negative Respiratory: negative Cardiovascular: negative Gastrointestinal: negative Integument/breast: positive for breast lump, breast tenderness, dryness, rash, skin color change and skin lesion(s) Musculoskeletal:negative Neurological: negative  PHYSICAL EXAMINATION: Blood pressure 173/84, pulse 97, temperature 98.3 F (36.8 C), temperature source Oral, resp. rate 18, height 5' 1.5" (1.562 m), weight 142 lb 3.2 oz (64.501 kg).  HQI:ONGEX, healthy, no distress, well nourished, well developed and anxious SKIN: skin color, texture, turgor are normal HEAD: Normocephalic EYES: normal, PERRLA, EOMI EARS: External ears normal OROPHARYNX:no exudate and no erythema  NECK: supple, no adenopathy LYMPH:  In the right axilla there is a palpable lymph node that is mobile. BREAST:abnormal mass palpable In the right breast with nipple retraction erythema and peau d'orange. LUNGS: clear to auscultation and percussion HEART: regular rate & rhythm ABDOMEN:abdomen soft, non-tender, normal bowel sounds and no masses or organomegaly BACK: Back symmetric, no curvature. EXTREMITIES:no edema, no clubbing, no cyanosis  NEURO: alert & oriented x 3 with fluent speech, no focal motor/sensory deficits, gait normal, reflexes normal and symmetric     STUDIES/RESULTS: Mr Breast Bilateral W Wo Contrast  07/19/2012  *RADIOLOGY REPORT*  Clinical Data: 66 year old female with newly diagnosed right invasive  mammary carcinoma and mammary carcinoma in situ with lymphovascular invasion.  BILATERAL BREAST MRI WITH AND WITHOUT CONTRAST  Technique: Multiplanar, multisequence MR images of both breasts were obtained prior to and following the intravenous administration of 14ml of Multihance .  Three dimensional images were evaluated at the independent DynaCad workstation.  Comparison:  06/25/2012 and prior mammograms from [facility] dating back to 08/07/2010.  05/25/2012 and 07/12/2012 ultrasounds.  Findings: Mild normal background parenchymal enhancement is identified within the left breast.  The right breast is much smaller than the left.  Abnormal irregular enhancement throughout the majority of the right breast is noted with skin thickening, compatible with biopsy-proven neoplasm and lymphovascular/dermal invasion.  Biopsy clip artifact within the upper central right breast is identified.  Enlarged level I right axillary lymph nodes are present. There are no abnormal appearing or enlarged lymph nodes within the internal mammary or left axillary regions.  No abnormal areas of enhancement are identified within the left breast.  Mild non mass-like enhancement within the pre sternal fat is present.  This is nonspecific and could represent mild inflammation but neoplasm is difficult to exclude.  The visualized upper liver and bony structures are within normal limits.   IMPRESSION: Diffuse right breast neoplasm and enlarged level I right  axillary lymph nodes compatible with lymphatic spread.  Mild non mass-like enhancement within the presternal fat - second look ultrasound / sampling would be helpful to exclude neoplasm.  No evidence of LEFT breast malignancy.  BI-RADS CATEGORY 6:  Known biopsy-proven malignancy - appropriate action should be taken.  RECOMMENDATION: Consider second look ultrasound / sampling of the mild non mass- like enhancement in the pre sternal fat, if neoplasm in this region would affect treatment.   THREE-DIMENSIONAL MR IMAGE RENDERING ON INDEPENDENT WORKSTATION:  Three-dimensional MR images were rendered by post-processing of the original MR data on an independent workstation.  The three- dimensional MR images were interpreted, and findings were reported in the accompanying complete MRI report for this study.   Original Report Authenticated By: Rosendo Gros, M.D.      LABS:    Chemistry      Component Value Date/Time   NA 136 05/27/2012 1230   K 3.8 05/27/2012 1230   CL 101 05/27/2012 1230   CO2 26 05/27/2012 1230   BUN 11 05/27/2012 1230   CREATININE 0.57 05/27/2012 1230      Component Value Date/Time   CALCIUM 8.9 05/27/2012 1230   ALKPHOS 79 05/27/2012 1230   AST 107* 05/27/2012 1230   ALT 207* 05/27/2012 1230   BILITOT 0.4 05/27/2012 1230      Lab Results  Component Value Date   WBC 6.5 07/19/2012   HGB 12.9 07/19/2012   HCT 38.4 07/19/2012   MCV 88.8 07/19/2012   PLT 205 07/19/2012       PATHOLOGY: 07/14/12 REASON FOR ADDENDUM, AMENDMENT OR CORRECTION: [id number]: E-cadherin immunostain. FINAL DIAGNOSIS Diagnosis 1. Breast, right, needle core biopsy, 12:00 - INVASIVE MAMMARY CARCINOMA, SEE COMMENT. - LYMPHOVASCULAR INVASION IDENTIFIED. 2. Breast, right, needle core biopsy, 12:00 - INVASIVE MAMMARY CARCINOMA, SEE COMMENT. - MAMMARY CARCINOMA IN SITU. - LYMPHOVASCULAR INVASION IDENTIFIED. Microscopic Comment 1. E-cadherin stain is pending and will be reported in an Addendum. Although the grade of tumor is best assessed at resection, with this biopsies, both the in situ and invasive carcinoma are Grade 3. Case was reviewed with Dr. Raynald Blend, who concurs. (CRR:caf 07/15/12) PARTS 1 AND 2, ADDENDUM: Both the invasive and in situ carcinoma demonstrate weak, discontinuous to absent E-Cadherin immunostain expression, supporting a lobular origin. (CRR:eps 07/15/12) [place] RUND DO Pathologist, Electronic Signature (Case signed 07/16/2012) Specimen Gr  FINAL DIAGNOSIS  07/15/12 Diagnosis Breast, right, needle core biopsy, punch bx - DERMAL INVOLVEMENT WITH HIGH GRADE INVASIVE MAMMARY CARCINOMA. - LYMPH/VASCULAR INVASION IS PRESENT. - SEE COMMENT. Microscopic Comment The findings are supportive of a diagnosis of inflammatory breast carcinoma given the corresponding clinical impression. A breast prognostic profile will not be performed on this case unless requested. The previous biopsy ([id number]) is reviewed in conjunction with the current case. Dr. Dierdre Searles has seen this case in consultation with essential agreement. (RAH:gt, 07/16/12) Zandra Abts MD  ASSESSMENT    67 year old female with new diagnosis of what looks like inflammatory invasive mammary carcinoma. She does have a palpable lymph node on physical examination in the right axilla. MRI results are reviewed that shows diffuse right breast neoplasm and enlarged level I right axillary lymph nodes compatible with lymphatic spread.Patient and I discussed her MRI results. I discussed her pathology. We also discussed rationale for treatment. Patient concurs with my recommendations. Her case will be discussed at the multidisciplinary breast conference this week.  PLAN:    #1 patient will need biopsy of the right axillary lymph nodes.  #2 I  will get staging scans including PET CT.  #3 she is a good candidate for neoadjuvant chemotherapy. This would be basically to reduce the size of the mass and to perform a good mastectomy and axillary lymph node dissection.  #4 since the prognostic markers are not back yet I was not able to discuss specifics of her chemotherapy however we did discuss general rationale for neoadjuvant chemotherapy risks and benefits and side effects.  #5 patient will need a Port-A-Cath placement as well as an echocardiogram and chemotherapy teaching class and all of this has been set up for her.  #6 my plan is to get the patient started on treatments by the end of this week.        Discussion: Patient is being treated per NCCN breast cancer care guidelines appropriate for stage inflammatory breast cancer   Thank you so much for allowing me to participate in the care of [facility]. I will continue to follow up the patient with you and assist in her care.  All questions were answered. The patient knows to call the clinic with any problems, questions or concerns. We can certainly see the patient much sooner if necessary.  I spent 60 minutes counseling the patient face to face. The total time spent in the appointment was 60 minutes.  Drue Second, MD Medical/Oncology [facility] [phone] (beeper) [phone] (Office)  07/19/2012, 4:10 PM

## 2012-07-19 NOTE — Patient Instructions (Addendum)
1. PET/CT for staging  2. Echocardiogram  3. Port a cath placement by Dr. Dwain Sarna  4. Chemo class  5. Possible chemo start on 8/26 or sooner

## 2012-07-20 ENCOUNTER — Telehealth: Payer: Self-pay | Admitting: Internal Medicine

## 2012-07-20 ENCOUNTER — Other Ambulatory Visit (INDEPENDENT_AMBULATORY_CARE_PROVIDER_SITE_OTHER): Payer: Self-pay | Admitting: General Surgery

## 2012-07-20 ENCOUNTER — Other Ambulatory Visit: Payer: Self-pay | Admitting: Radiology

## 2012-07-20 ENCOUNTER — Telehealth (INDEPENDENT_AMBULATORY_CARE_PROVIDER_SITE_OTHER): Payer: Self-pay

## 2012-07-20 ENCOUNTER — Encounter (HOSPITAL_BASED_OUTPATIENT_CLINIC_OR_DEPARTMENT_OTHER): Payer: Self-pay | Admitting: *Deleted

## 2012-07-20 DIAGNOSIS — C773 Secondary and unspecified malignant neoplasm of axilla and upper limb lymph nodes: Secondary | ICD-10-CM | POA: Diagnosis not present

## 2012-07-20 DIAGNOSIS — R599 Enlarged lymph nodes, unspecified: Secondary | ICD-10-CM | POA: Diagnosis not present

## 2012-07-20 DIAGNOSIS — C50919 Malignant neoplasm of unspecified site of unspecified female breast: Secondary | ICD-10-CM | POA: Diagnosis not present

## 2012-07-20 NOTE — Telephone Encounter (Signed)
Called pt to let her know that Dr Dwain Sarna did receive a message from Dr Welton Flakes that she is wanting to get the [facility] placed ASAP b/c chemo starts on Friday 8/23. Dr Dwain Sarna is going to put the orders in epic for the [facility] to be done on 8/21 b/c that is the only area to work into the scheduled this week for the chemo to get started on Friday. The pt understands and will wait from a call from our schedulers.

## 2012-07-20 NOTE — Progress Notes (Signed)
Had labs at cancer center 07/19/12-cbc,cmet No labs needed here0ekg done 6/13 Sees dr Anne Fu for hx PAT-no meds

## 2012-07-20 NOTE — Progress Notes (Signed)
Got stress test and last ov from dr skains-pt had stress test 1/12-was mildly positive-to treat medically Pt was to see him 1/13-cancelled appt due to bad weather-never r/s Has not had any PAT. Showed this to Dr Inda Merlin for [facility], but needs to follow up with dr Anne Fu before any more surgery.

## 2012-07-20 NOTE — Telephone Encounter (Signed)
S/w the pt snd she is aware of her chemo educ class this week along with the pet scan/ct scan appt. Pt is aware that we will call her back with the rest of her appts

## 2012-07-21 ENCOUNTER — Telehealth: Payer: Self-pay

## 2012-07-21 ENCOUNTER — Encounter (HOSPITAL_BASED_OUTPATIENT_CLINIC_OR_DEPARTMENT_OTHER): Payer: Self-pay | Admitting: *Deleted

## 2012-07-21 ENCOUNTER — Encounter (HOSPITAL_BASED_OUTPATIENT_CLINIC_OR_DEPARTMENT_OTHER)
Admission: RE | Disposition: A | Discharge status: Home / Self Care | Payer: Self-pay | Source: Ambulatory Visit | Attending: General Surgery

## 2012-07-21 ENCOUNTER — Encounter (HOSPITAL_BASED_OUTPATIENT_CLINIC_OR_DEPARTMENT_OTHER): Payer: Self-pay | Admitting: Anesthesiology

## 2012-07-21 ENCOUNTER — Ambulatory Visit (HOSPITAL_COMMUNITY): Payer: Medicare Other

## 2012-07-21 ENCOUNTER — Other Ambulatory Visit: Payer: Self-pay | Admitting: Oncology

## 2012-07-21 ENCOUNTER — Telehealth: Payer: Self-pay | Admitting: Oncology

## 2012-07-21 ENCOUNTER — Ambulatory Visit (HOSPITAL_BASED_OUTPATIENT_CLINIC_OR_DEPARTMENT_OTHER): Payer: Medicare Other | Admitting: Anesthesiology

## 2012-07-21 ENCOUNTER — Ambulatory Visit (HOSPITAL_BASED_OUTPATIENT_CLINIC_OR_DEPARTMENT_OTHER)
Admission: RE | Admit: 2012-07-21 | Discharge: 2012-07-21 | Disposition: A | Discharge status: Home / Self Care | Payer: Medicare Other | Source: Ambulatory Visit | Attending: General Surgery | Admitting: General Surgery

## 2012-07-21 DIAGNOSIS — C50919 Malignant neoplasm of unspecified site of unspecified female breast: Secondary | ICD-10-CM | POA: Insufficient documentation

## 2012-07-21 DIAGNOSIS — K219 Gastro-esophageal reflux disease without esophagitis: Secondary | ICD-10-CM | POA: Diagnosis not present

## 2012-07-21 DIAGNOSIS — I499 Cardiac arrhythmia, unspecified: Secondary | ICD-10-CM | POA: Insufficient documentation

## 2012-07-21 DIAGNOSIS — C50119 Malignant neoplasm of central portion of unspecified female breast: Secondary | ICD-10-CM

## 2012-07-21 DIAGNOSIS — J9819 Other pulmonary collapse: Secondary | ICD-10-CM | POA: Diagnosis not present

## 2012-07-21 DIAGNOSIS — J45909 Unspecified asthma, uncomplicated: Secondary | ICD-10-CM | POA: Insufficient documentation

## 2012-07-21 HISTORY — DX: Cardiac arrhythmia, unspecified: I49.9

## 2012-07-21 HISTORY — DX: Nausea with vomiting, unspecified: R11.2

## 2012-07-21 HISTORY — DX: Unspecified osteoarthritis, unspecified site: M19.90

## 2012-07-21 HISTORY — PX: PORTACATH PLACEMENT: SHX2246

## 2012-07-21 HISTORY — DX: Other specified postprocedural states: Z98.890

## 2012-07-21 HISTORY — DX: Gastro-esophageal reflux disease without esophagitis: K21.9

## 2012-07-21 SURGERY — INSERTION, TUNNELED CENTRAL VENOUS DEVICE, WITH PORT
Anesthesia: General | Site: Chest | Laterality: Left | Wound class: Clean

## 2012-07-21 MED ORDER — ONDANSETRON HCL 8 MG PO TABS: ORAL_TABLET | ORAL | Status: DC

## 2012-07-21 MED ORDER — HEPARIN SOD (PORK) LOCK FLUSH 100 UNIT/ML IV SOLN
INTRAVENOUS | Status: DC | PRN
  Administered 2012-07-21: 500 [IU] via INTRAVENOUS

## 2012-07-21 MED ORDER — HEPARIN (PORCINE) IN NACL 2-0.9 UNIT/ML-% IJ SOLN
INTRAMUSCULAR | Status: DC | PRN
  Administered 2012-07-21: 500 mL

## 2012-07-21 MED ORDER — LIDOCAINE HCL (CARDIAC) 20 MG/ML IV SOLN
INTRAVENOUS | Status: DC | PRN
  Administered 2012-07-21: 50 mg via INTRAVENOUS

## 2012-07-21 MED ORDER — PROPOFOL 10 MG/ML IV EMUL
INTRAVENOUS | Status: DC | PRN
  Administered 2012-07-21: 200 mg via INTRAVENOUS

## 2012-07-21 MED ORDER — PROCHLORPERAZINE MALEATE 10 MG PO TABS: 10.0000 mg | ORAL_TABLET | Freq: Four times a day (QID) | ORAL | Status: DC | PRN

## 2012-07-21 MED ORDER — MIDAZOLAM HCL 2 MG/2ML IJ SOLN: 0.5000 mg | Freq: Once | INTRAMUSCULAR | Status: DC | PRN

## 2012-07-21 MED ORDER — OXYCODONE-ACETAMINOPHEN 5-325 MG PO TABS: 15.0000 | ORAL_TABLET | ORAL | Status: AC | PRN

## 2012-07-21 MED ORDER — FENTANYL CITRATE 0.05 MG/ML IJ SOLN
INTRAMUSCULAR | Status: DC | PRN
  Administered 2012-07-21 (×2): 50 ug via INTRAVENOUS

## 2012-07-21 MED ORDER — ONDANSETRON 8 MG PO TBDP: 8.0000 mg | ORAL_TABLET | Freq: Three times a day (TID) | ORAL | Status: AC | PRN

## 2012-07-21 MED ORDER — ONDANSETRON HCL 4 MG/2ML IJ SOLN
INTRAMUSCULAR | Status: DC | PRN
  Administered 2012-07-21: 4 mg via INTRAVENOUS

## 2012-07-21 MED ORDER — LACTATED RINGERS IV SOLN
INTRAVENOUS | Status: DC
  Administered 2012-07-21 (×2): via INTRAVENOUS

## 2012-07-21 MED ORDER — DEXAMETHASONE SODIUM PHOSPHATE 4 MG/ML IJ SOLN
INTRAMUSCULAR | Status: DC | PRN
  Administered 2012-07-21: 10 mg via INTRAVENOUS

## 2012-07-21 MED ORDER — CEFAZOLIN SODIUM-DEXTROSE 2-3 GM-% IV SOLR: 2.0000 g | INTRAVENOUS | Status: DC

## 2012-07-21 MED ORDER — LORAZEPAM 0.5 MG PO TABS: 0.5000 mg | ORAL_TABLET | Freq: Four times a day (QID) | ORAL | Status: DC | PRN

## 2012-07-21 MED ORDER — DEXAMETHASONE 4 MG PO TABS: ORAL_TABLET | ORAL | Status: DC

## 2012-07-21 MED ORDER — PROMETHAZINE HCL 25 MG/ML IJ SOLN: 6.2500 mg | INTRAMUSCULAR | Status: DC | PRN

## 2012-07-21 MED ORDER — EPHEDRINE SULFATE 50 MG/ML IJ SOLN
INTRAMUSCULAR | Status: DC | PRN
  Administered 2012-07-21 (×2): 10 mg via INTRAVENOUS

## 2012-07-21 MED ORDER — PROCHLORPERAZINE 25 MG RE SUPP: 25.0000 mg | Freq: Two times a day (BID) | RECTAL | Status: DC | PRN

## 2012-07-21 MED ORDER — FENTANYL CITRATE 0.05 MG/ML IJ SOLN: 25.0000 ug | INTRAMUSCULAR | Status: DC | PRN

## 2012-07-21 MED ORDER — SCOPOLAMINE 1 MG/3DAYS TD PT72
1.0000 | MEDICATED_PATCH | TRANSDERMAL | Status: DC
  Administered 2012-07-21: 1.5 mg via TRANSDERMAL

## 2012-07-21 MED ORDER — MIDAZOLAM HCL 5 MG/5ML IJ SOLN
INTRAMUSCULAR | Status: DC | PRN
  Administered 2012-07-21: 2 mg via INTRAVENOUS

## 2012-07-21 MED ORDER — MEPERIDINE HCL 25 MG/ML IJ SOLN: 6.2500 mg | INTRAMUSCULAR | Status: DC | PRN

## 2012-07-21 SURGICAL SUPPLY — 50 items
BAG DECANTER FOR FLEXI CONT (MISCELLANEOUS) ×2 IMPLANT
BENZOIN TINCTURE PRP APPL 2/3 (GAUZE/BANDAGES/DRESSINGS) ×2 IMPLANT
BLADE SURG 11 STRL SS (BLADE) ×2 IMPLANT
BLADE SURG 15 STRL LF DISP TIS (BLADE) ×1 IMPLANT
BLADE SURG 15 STRL SS (BLADE) ×1
CANISTER SUCTION 1200CC (MISCELLANEOUS) IMPLANT
CHLORAPREP W/TINT 26ML (MISCELLANEOUS) ×2 IMPLANT
CLOTH BEACON ORANGE TIMEOUT ST (SAFETY) ×2 IMPLANT
COVER MAYO STAND STRL (DRAPES) ×2 IMPLANT
COVER TABLE BACK 60X90 (DRAPES) ×2 IMPLANT
DECANTER SPIKE VIAL GLASS SM (MISCELLANEOUS) IMPLANT
DERMABOND ADVANCED (GAUZE/BANDAGES/DRESSINGS) ×1
DERMABOND ADVANCED .7 DNX12 (GAUZE/BANDAGES/DRESSINGS) ×1 IMPLANT
DRAPE C-ARM 42X72 X-RAY (DRAPES) ×2 IMPLANT
DRAPE LAPAROSCOPIC ABDOMINAL (DRAPES) ×2 IMPLANT
DRSG TEGADERM 4X4.75 (GAUZE/BANDAGES/DRESSINGS) IMPLANT
ELECT COATED BLADE 2.86 ST (ELECTRODE) ×2 IMPLANT
ELECT REM PT RETURN 9FT ADLT (ELECTROSURGICAL) ×2
ELECTRODE REM PT RTRN 9FT ADLT (ELECTROSURGICAL) ×1 IMPLANT
GAUZE SPONGE 4X4 12PLY STRL LF (GAUZE/BANDAGES/DRESSINGS) ×2 IMPLANT
GLOVE BIO SURGEON STRL SZ7 (GLOVE) ×4 IMPLANT
GLOVE BIOGEL PI IND STRL 7.5 (GLOVE) ×1 IMPLANT
GLOVE BIOGEL PI INDICATOR 7.5 (GLOVE) ×1
GOWN PREVENTION PLUS XLARGE (GOWN DISPOSABLE) ×2 IMPLANT
GOWN PREVENTION PLUS XXLARGE (GOWN DISPOSABLE) ×2 IMPLANT
IV HEPARIN 1000UNITS/500ML (IV SOLUTION) ×2 IMPLANT
IV KIT MINILOC 20X1 SAFETY (NEEDLE) IMPLANT
KIT PORT POWER 8FR ISP CVUE (Catheter) ×2 IMPLANT
KIT POWER CATH 8FR (Catheter) IMPLANT
NDL SAFETY ECLIPSE 18X1.5 (NEEDLE) IMPLANT
NEEDLE HYPO 18GX1.5 SHARP (NEEDLE)
NEEDLE HYPO 25X1 1.5 SAFETY (NEEDLE) ×2 IMPLANT
PACK BASIN DAY SURGERY FS (CUSTOM PROCEDURE TRAY) ×2 IMPLANT
PENCIL BUTTON HOLSTER BLD 10FT (ELECTRODE) ×2 IMPLANT
SLEEVE SCD COMPRESS KNEE MED (MISCELLANEOUS) ×2 IMPLANT
STAPLER VISISTAT 35W (STAPLE) ×2 IMPLANT
STRIP CLOSURE SKIN 1/2X4 (GAUZE/BANDAGES/DRESSINGS) ×2 IMPLANT
SUT MON AB 4-0 PC3 18 (SUTURE) ×2 IMPLANT
SUT PROLENE 2 0 SH DA (SUTURE) ×2 IMPLANT
SUT SILK 2 0 TIES 17X18 (SUTURE)
SUT SILK 2-0 18XBRD TIE BLK (SUTURE) IMPLANT
SUT VIC AB 3-0 SH 27 (SUTURE) ×1
SUT VIC AB 3-0 SH 27X BRD (SUTURE) ×1 IMPLANT
SYR 5ML LUER SLIP (SYRINGE) ×2 IMPLANT
SYR CONTROL 10ML LL (SYRINGE) ×2 IMPLANT
TOWEL OR 17X24 6PK STRL BLUE (TOWEL DISPOSABLE) ×2 IMPLANT
TOWEL OR NON WOVEN STRL DISP B (DISPOSABLE) ×2 IMPLANT
TUBE CONNECTING 20X1/4 (TUBING) IMPLANT
WATER STERILE IRR 1000ML POUR (IV SOLUTION) ×2 IMPLANT
YANKAUER SUCT BULB TIP NO VENT (SUCTIONS) IMPLANT

## 2012-07-21 NOTE — Interval H&P Note (Signed)
History and Physical Interval Note:  07/21/2012 1:39 PM I discussed port placement and risks of bleeding, infection and ptx.   Jessica Wells  has presented today for surgery, with the diagnosis of breast cancer  The various methods of treatment have been discussed with the patient and family. After consideration of risks, benefits and other options for treatment, the patient has consented to  Procedure(s) (LRB): INSERTION PORT-A-CATH (N/A) as a surgical intervention .  The patient's history has been reviewed, patient examined, no change in status, stable for surgery.  I have reviewed the patient's chart and labs.  Questions were answered to the patient's satisfaction.     Katriel Cutsforth

## 2012-07-21 NOTE — Anesthesia Postprocedure Evaluation (Signed)
  Anesthesia Post-op Note  Patient: Jessica Wells  Procedure(s) Performed: Procedure(s) (LRB): INSERTION PORT-A-CATH (Left)  Patient Location: PACU  Anesthesia Type: General  Level of Consciousness: awake, alert  and oriented  Airway and Oxygen Therapy: Patient Spontanous Breathing  Post-op Pain: none  Post-op Assessment: Post-op Vital signs reviewed, Patient's Cardiovascular Status Stable, Respiratory Function Stable, Patent Airway, No signs of Nausea or vomiting and Pain level controlled  Post-op Vital Signs: Reviewed and stable  Complications: No apparent anesthesia complications

## 2012-07-21 NOTE — Op Note (Signed)
Preoperative diagnosis: Right inflammatory breast cancer Postoperative diagnosis: SAA Procedure: Left subclavian ClearVue powerport placement Surgeon: Dr. Harden Mo Anesthesia: Gen. With LMA Estimated blood loss: Minimal Complications: None Sponge and needle count correct x2 at end of operation Specimens: None Disposition to recovery in stable condition  Indications: This is a 66 year old female who we've recently diagnosed with a right breast inflammatory cancer. She is due to begin chemotherapy soon and I discussed port placement with her.  Procedure: After informed consent was obtained the patient was taken to the operating room. She was administered 2 g of intravenous cefazolin. Sequential compression devices were placed on her legs. She was then placed under general anesthesia with an LMA. Her arms were tucked and appropriately padded. An axillary roll was placed. Her chest was prepped and draped in the standard sterile surgical fashion. A surgical timeout was then performed.  I anesthetized her left chest and the clavicle with quarter percent Marcaine.I then accessed her subclavian vein on the first pass. The wire was placed and  this was confirmed by fluoroscopy. I then made a pocket below this overlying the pectoralis fascia. I then tunneled a line between the 2 sites. I then dilated the tract. I then inserted the sheath assembly. This was confirmed by fluoroscopy to be in the correct position. I then removed the wire assembly. I then placed the line into the sheath and removed it. I then pulled the line back to be in the distal cava. I then attached the line to the port and sutured this into 3 positions a 2-0 Prolene suture. This flushed easily and aspirated blood. This was packed with heparin. I did one final fluoroscopy showed the line to be in good position without any kinks and the port was also in good position. I then closes with 3-0 Vicryl, 4 Monocryl, and Dermabond. She tolerated  this well and was transferred to recovery.

## 2012-07-21 NOTE — Transfer of Care (Signed)
Immediate Anesthesia Transfer of Care Note  Patient: Jessica Wells  Procedure(s) Performed: Procedure(s) (LRB): INSERTION PORT-A-CATH (Left)  Patient Location: PACU  Anesthesia Type: General  Level of Consciousness: awake and alert   Airway & Oxygen Therapy: Patient Spontanous Breathing and Patient connected to face mask oxygen  Post-op Assessment: Report given to PACU RN and Post -op Vital signs reviewed and stable  Post vital signs: Reviewed and stable  Complications: No apparent anesthesia complications

## 2012-07-21 NOTE — Telephone Encounter (Signed)
S/w the pt and she is aware of her ct/pet scan appt and the chemo educ class. Pt is aware that we will call her with the rest of her appts

## 2012-07-21 NOTE — Anesthesia Preprocedure Evaluation (Signed)
Anesthesia Evaluation  Patient identified by MRN, date of birth, ID band Patient awake    Reviewed: Allergy & Precautions, H&P , NPO status , Patient's Chart, lab work & pertinent test results  History of Anesthesia Complications (+) PONV  Airway Mallampati: II TM Distance: >3 FB Neck ROM: Full    Dental No notable dental hx. (+) Teeth Intact and Dental Advisory Given   Pulmonary neg pulmonary ROS, asthma (last inhaler 6 weeks ago) ,  breath sounds clear to auscultation  Pulmonary exam normal       Cardiovascular negative cardio ROS  + dysrhythmias (remote history of SVT, has not recurred in years) Supra Ventricular Tachycardia Rhythm:Regular Rate:Normal     Neuro/Psych negative neurological ROS     GI/Hepatic Neg liver ROS, GERD-  Medicated and Controlled,  Endo/Other  negative endocrine ROS  Renal/GU negative Renal ROS     Musculoskeletal   Abdominal   Peds  Hematology   Anesthesia Other Findings   Reproductive/Obstetrics                           Anesthesia Physical Anesthesia Plan  ASA: II  Anesthesia Plan: General   Post-op Pain Management:    Induction: Intravenous  Airway Management Planned: LMA  Additional Equipment:   Intra-op Plan:   Post-operative Plan:   Informed Consent: I have reviewed the patients History and Physical, chart, labs and discussed the procedure including the risks, benefits and alternatives for the proposed anesthesia with the patient or authorized representative who has indicated his/her understanding and acceptance.   Dental advisory given  Plan Discussed with: CRNA and Surgeon  Anesthesia Plan Comments: (Plan routine monitors, GA- LMA OK * patient denies any allergy to local anesthetics)        Anesthesia Quick Evaluation

## 2012-07-21 NOTE — Telephone Encounter (Signed)
Per staff message and POF I have scheduled appts. TMB 

## 2012-07-21 NOTE — Telephone Encounter (Signed)
S/w the pt and she is aware of the echo/appt with dr benshimon in aug.. Pt has her echo appt on 07/22/2012@9 :00am at [facility]

## 2012-07-21 NOTE — H&P (View-Only) (Signed)
Subjective:     Patient ID: Jessica Wells, female   DOB: [birth year], 66 y.o.   MRN: [id number]  HPI 72 yof who I saw on Monday for possible right breast abscess.  She has been treated for infection but not any better really.  She returned to see radiology this week after I saw her with showed definitive focus of mixed density centrally in the retroarealar area at 12 o clock.  Maximum dimension is 3.6 cm.  She underwent a core biopsy of this area.  This shows invasive mammary carcinoma with LVI.  This appears to be grade III.  Review of Systems     Objective:   Physical Exam unchanged    Assessment:     Right breast cancer    Plan:      We discussed the staging and pathophysiology of breast cancer. We discussed all of the different options for treatment for breast cancer including surgery, chemotherapy, radiation therapy, Herceptin, and antiestrogen therapy.  We need more information prior to determining therapy.  I am concerned about inflammatory cancer.  Plan is as follows: 1. Punch biopsy skin- I anesthetized the skin of breast where it is most red after cleansing.  I did a 5mm punch biopsy and closed this with 4-0 nylon.  Dressing placed.  Path pending. 2. MRI breast due Monday at 8 am 3. Await path markers 4. Oncology appt asap, I think she is likely candidate for primary systemic therapy 5. Genetics referral given fh 6. I will see back next week

## 2012-07-22 ENCOUNTER — Other Ambulatory Visit: Payer: Self-pay | Admitting: *Deleted

## 2012-07-22 ENCOUNTER — Other Ambulatory Visit (HOSPITAL_COMMUNITY): Payer: Medicare Other

## 2012-07-22 ENCOUNTER — Encounter: Payer: Self-pay | Admitting: *Deleted

## 2012-07-22 ENCOUNTER — Other Ambulatory Visit: Payer: Medicare Other

## 2012-07-22 ENCOUNTER — Ambulatory Visit (HOSPITAL_COMMUNITY)
Admission: RE | Admit: 2012-07-22 | Discharge: 2012-07-22 | Disposition: A | Discharge status: Home / Self Care | Payer: Medicare Other | Source: Ambulatory Visit | Attending: Oncology | Admitting: Oncology

## 2012-07-22 ENCOUNTER — Telehealth: Payer: Self-pay | Admitting: *Deleted

## 2012-07-22 DIAGNOSIS — I4891 Unspecified atrial fibrillation: Secondary | ICD-10-CM | POA: Insufficient documentation

## 2012-07-22 DIAGNOSIS — C50919 Malignant neoplasm of unspecified site of unspecified female breast: Secondary | ICD-10-CM | POA: Insufficient documentation

## 2012-07-22 DIAGNOSIS — I517 Cardiomegaly: Secondary | ICD-10-CM | POA: Diagnosis not present

## 2012-07-22 DIAGNOSIS — C50119 Malignant neoplasm of central portion of unspecified female breast: Secondary | ICD-10-CM

## 2012-07-22 MED ORDER — LIDOCAINE-PRILOCAINE 2.5-2.5 % EX CREA: TOPICAL_CREAM | CUTANEOUS | Status: AC | PRN

## 2012-07-22 NOTE — Telephone Encounter (Signed)
Per staff messages I have scheduled appts.  JMW  

## 2012-07-23 ENCOUNTER — Encounter: Payer: Self-pay | Admitting: Oncology

## 2012-07-23 ENCOUNTER — Ambulatory Visit (HOSPITAL_BASED_OUTPATIENT_CLINIC_OR_DEPARTMENT_OTHER): Payer: Medicare Other | Admitting: Oncology

## 2012-07-23 ENCOUNTER — Ambulatory Visit (HOSPITAL_BASED_OUTPATIENT_CLINIC_OR_DEPARTMENT_OTHER): Payer: Medicare Other

## 2012-07-23 ENCOUNTER — Ambulatory Visit (HOSPITAL_COMMUNITY)
Admission: RE | Admit: 2012-07-23 | Discharge: 2012-07-23 | Disposition: A | Discharge status: Home / Self Care | Payer: Medicare Other | Source: Ambulatory Visit | Attending: Internal Medicine | Admitting: Internal Medicine

## 2012-07-23 ENCOUNTER — Telehealth: Payer: Self-pay | Admitting: *Deleted

## 2012-07-23 ENCOUNTER — Encounter (HOSPITAL_COMMUNITY): Payer: Self-pay

## 2012-07-23 VITALS — BP 150/86 | HR 78 | Temp 98.8°F | Resp 20 | Ht 61.0 in | Wt 139.9 lb

## 2012-07-23 VITALS — BP 160/90 | HR 84 | Ht 61.0 in | Wt 140.1 lb

## 2012-07-23 DIAGNOSIS — C50119 Malignant neoplasm of central portion of unspecified female breast: Secondary | ICD-10-CM | POA: Insufficient documentation

## 2012-07-23 DIAGNOSIS — Z5111 Encounter for antineoplastic chemotherapy: Secondary | ICD-10-CM

## 2012-07-23 DIAGNOSIS — C50019 Malignant neoplasm of nipple and areola, unspecified female breast: Secondary | ICD-10-CM | POA: Diagnosis not present

## 2012-07-23 DIAGNOSIS — Z171 Estrogen receptor negative status [ER-]: Secondary | ICD-10-CM | POA: Diagnosis not present

## 2012-07-23 DIAGNOSIS — C50919 Malignant neoplasm of unspecified site of unspecified female breast: Secondary | ICD-10-CM | POA: Diagnosis not present

## 2012-07-23 DIAGNOSIS — C773 Secondary and unspecified malignant neoplasm of axilla and upper limb lymph nodes: Secondary | ICD-10-CM

## 2012-07-23 MED ORDER — FOSAPREPITANT DIMEGLUMINE INJECTION 150 MG
150.0000 mg | Freq: Once | INTRAVENOUS | Status: AC
  Administered 2012-07-23: 150 mg via INTRAVENOUS
  Filled 2012-07-23: qty 5

## 2012-07-23 MED ORDER — SODIUM CHLORIDE 0.9 % IV SOLN
Freq: Once | INTRAVENOUS | Status: AC
  Administered 2012-07-23: 14:00:00 via INTRAVENOUS

## 2012-07-23 MED ORDER — EPIRUBICIN HCL CHEMO IV INJECTION 200 MG/100ML
100.0000 mg/m2 | Freq: Once | INTRAVENOUS | Status: AC
  Administered 2012-07-23: 166 mg via INTRAVENOUS
  Filled 2012-07-23: qty 83

## 2012-07-23 MED ORDER — DEXAMETHASONE SODIUM PHOSPHATE 4 MG/ML IJ SOLN
12.0000 mg | Freq: Once | INTRAMUSCULAR | Status: AC
  Administered 2012-07-23: 12 mg via INTRAVENOUS

## 2012-07-23 MED ORDER — FLUOROURACIL CHEMO INJECTION 2.5 GM/50ML
500.0000 mg/m2 | Freq: Once | INTRAVENOUS | Status: AC
  Administered 2012-07-23: 850 mg via INTRAVENOUS
  Filled 2012-07-23: qty 17

## 2012-07-23 MED ORDER — SODIUM CHLORIDE 0.9 % IV SOLN
500.0000 mg/m2 | Freq: Once | INTRAVENOUS | Status: AC
  Administered 2012-07-23: 820 mg via INTRAVENOUS
  Filled 2012-07-23: qty 41

## 2012-07-23 MED ORDER — PALONOSETRON HCL INJECTION 0.25 MG/5ML
0.2500 mg | Freq: Once | INTRAVENOUS | Status: AC
  Administered 2012-07-23: 0.25 mg via INTRAVENOUS

## 2012-07-23 MED ORDER — SODIUM CHLORIDE 0.9 % IJ SOLN
10.0000 mL | INTRAMUSCULAR | Status: DC | PRN
  Administered 2012-07-23: 10 mL
  Filled 2012-07-23: qty 10

## 2012-07-23 MED ORDER — HEPARIN SOD (PORK) LOCK FLUSH 100 UNIT/ML IV SOLN
500.0000 [IU] | Freq: Once | INTRAVENOUS | Status: AC | PRN
  Administered 2012-07-23: 500 [IU]
  Filled 2012-07-23: qty 5

## 2012-07-23 NOTE — Telephone Encounter (Signed)
Starting on 07-28-2012 patient will be coming to see the md firsted weekly on that day even though orders say start on 07-29-2012 gave patient appointment for 07-28-2012 at 2:15pm made patient appointment with dr.wentworth on 08-07-2012 starting at 10:30am

## 2012-07-23 NOTE — Assessment & Plan Note (Addendum)
Discussed the purpose of the HF clinic as it relates to breast cancer treatment. Informed there is about a 10% chance of cardiotoxicty associated with Herceptin treatment.  Explained the purpose of ongoing ECHOs. Reviewed ECHO form 07/22/12 with her personally.  Will plan to follow every 3 months with an ECHO while she is receiving Herceptin. Reviewed sx of heart failure and what to watch for.

## 2012-07-23 NOTE — Patient Instructions (Addendum)
Follow up in 3 months with an ECHO. (with Dr Bensimhon) 

## 2012-07-23 NOTE — Patient Instructions (Addendum)
Proceed with chemotherapy today  Take zyrtec and tylenol tomorrow prior to neulasta injection on Saturday  I will see you back on 8/28 for follow up and labs

## 2012-07-23 NOTE — Progress Notes (Signed)
OFFICE PROGRESS NOTE  CC  [name], MD [address] [place] [address]  DIAGNOSIS: 66 year old female with new diagnosis of inflammatory breast cancer  PRIOR THERAPY: #1 patient was originally seen in my clinic for new diagnosis of inflammatory breast cancer she was referred by Dr. Emelia Loron. She had a mammogram performed That showed an abnormality. That was biopsied and it showed an invasive mammary carcinoma with lymphovascular invasion grade 3 ER negative PR negative HER-2/neu positive.  #2 patient has gone on to have MRI of the breasts performed the MRI shows diffuse right breast neoplasm on a large level I right axillary lymph nodes compatible with lymphatic spread.Patient has had a biopsy of the right axillary lymph node that is compatible with invasive mammary carcinoma.  CURRENT THERAPY:Patient is being seen for consideration of neoadjuvant chemotherapy consisting of FEC dose dene with day 2 neulasta  INTERVAL HISTORY: Jessica Wells 66 y.o. female returns for Followup visit prior to her chemotherapy. Overall she's doing well. She denies any fevers chills night sweats headaches shortness of breath chest pains palpitations she has no myalgias and arthralgias. She did have an echocardiogram performed she has also had chemotherapy teaching class. Remainder of the  MEDICAL HISTORY: Past Medical History  Diagnosis Date  . Asthma   . GERD (gastroesophageal reflux disease)   . PONV (postoperative nausea and vomiting)     severe  . Arthritis   . Dysrhythmia     PAT-sees dr Laurence Compton meds  . Cancer     rt br ca    ALLERGIES:  is allergic to anesthetics, amide; bactrim; sulfa antibiotics; codeine; codeine phosphate; and eggs or egg-derived products.  MEDICATIONS:  Current Outpatient Prescriptions  Medication Sig Dispense Refill  . albuterol (PROVENTIL HFA;VENTOLIN HFA) 108 (90 BASE) MCG/ACT inhaler Inhale 2 puffs into the lungs every 6 (six) hours  as needed.      Marland Kitchen aspirin EC 81 MG tablet Take 162 mg by mouth every 6 (six) hours as needed. For blood clot symptoms      . calcium-vitamin D (OSCAL WITH D) 500-200 MG-UNIT per tablet Take 1 tablet by mouth daily.      Marland Kitchen dexamethasone (DECADRON) 4 MG tablet Take 2 tablets by mouth once a day on the day after chemotherapy and then take 2 tablets two times a day for 2 days. Take with food.  30 tablet  1  . fish oil-omega-3 fatty acids 1000 MG capsule Take 2 g by mouth daily.      . Flaxseed, Linseed, (FLAX SEEDS PO) Take 1 tablet by mouth daily.      . fluticasone (FLONASE) 50 MCG/ACT nasal spray Place 1 spray into the nose daily.      Marland Kitchen glucosamine-chondroitin 500-400 MG tablet Take 1 tablet by mouth daily.      Marland Kitchen lidocaine-prilocaine (EMLA) cream Apply topically as needed.  30 g  5  . LORazepam (ATIVAN) 0.5 MG tablet Take 1 tablet (0.5 mg total) by mouth every 6 (six) hours as needed (Nausea or vomiting).  30 tablet  0  . Multiple Vitamin (MULTIVITAMIN WITH MINERALS) TABS Take 1 tablet by mouth daily.      Marland Kitchen omeprazole (PRILOSEC) 20 MG capsule Take 20 mg by mouth daily.      . ondansetron (ZOFRAN ODT) 8 MG disintegrating tablet Take 1 tablet (8 mg total) by mouth every 8 (eight) hours as needed for nausea.  20 tablet  0  . ondansetron (ZOFRAN) 8 MG tablet Take 1  tablet two times a day as needed for nausea or vomiting starting on the third day after chemotherapy.  30 tablet  1  . oxyCODONE-acetaminophen (ROXICET) 5-325 MG per tablet Take 15 tablets by mouth every 4 (four) hours as needed for pain.  30 tablet  0  . prochlorperazine (COMPAZINE) 10 MG tablet Take 1 tablet (10 mg total) by mouth every 6 (six) hours as needed (Nausea or vomiting).  30 tablet  1  . prochlorperazine (COMPAZINE) 25 MG suppository Place 1 suppository (25 mg total) rectally every 12 (twelve) hours as needed for nausea.  12 suppository  3    SURGICAL HISTORY:  Past Surgical History  Procedure Date  . Dilation and  curettage of uterus     x3  . Tonsillectomy   . Colonoscopy     REVIEW OF SYSTEMS:  Pertinent items are noted in HPI.   PHYSICAL EXAMINATION: General appearance: alert and cooperative Lymph nodes: noted to have a palpable right axillary lymph node measuring about 2 cm. No supraclavicular adenopathy cervical lymphadenopathy inguinal or left axillary lymphadenopathy. Resp: clear to auscultation bilaterally Back: symmetric, no curvature. ROM normal. No CVA tenderness. Cardio: regular rate and rhythm, S1, S2 normal, no murmur, click, rub or gallop GI: soft, non-tender; bowel sounds normal; no masses,  no organomegaly Extremities: extremities normal, atraumatic, no cyanosis or edema Neurologic: Grossly normal Right breast reveals grossly enlarged breast with Dimpling of the skin And retraction of the nipple.Left breast no masses or nipple discharge or skin changes.  ECOG PERFORMANCE STATUS: 0 - Asymptomatic  Blood pressure 150/86, pulse 78, temperature 98.8 F (37.1 C), temperature source Oral, resp. rate 20, height 5\' 1"  (1.549 m), weight 139 lb 14.4 oz (63.458 kg).  LABORATORY DATA: Lab Results  Component Value Date   WBC 6.5 07/19/2012   HGB 12.9 07/19/2012   HCT 38.4 07/19/2012   MCV 88.8 07/19/2012   PLT 205 07/19/2012      Chemistry      Component Value Date/Time   NA 140 07/19/2012 1538   K 3.7 07/19/2012 1538   CL 103 07/19/2012 1538   CO2 27 07/19/2012 1538   BUN 15 07/19/2012 1538   CREATININE 0.77 07/19/2012 1538      Component Value Date/Time   CALCIUM 9.3 07/19/2012 1538   ALKPHOS 60 07/19/2012 1538   AST 17 07/19/2012 1538   ALT 16 07/19/2012 1538   BILITOT 0.2* 07/19/2012 1538       RADIOGRAPHIC STUDIES:  Mr Breast Bilateral W Wo Contrast  07/19/2012  *RADIOLOGY REPORT*  Clinical Data: 66 year old female with newly diagnosed right invasive mammary carcinoma and mammary carcinoma in situ with lymphovascular invasion.  BILATERAL BREAST MRI WITH AND WITHOUT CONTRAST   Technique: Multiplanar, multisequence MR images of both breasts were obtained prior to and following the intravenous administration of 14ml of Multihance .  Three dimensional images were evaluated at the independent DynaCad workstation.  Comparison:  06/25/2012 and prior mammograms from [facility] dating back to 08/07/2010.  05/25/2012 and 07/12/2012 ultrasounds.  Findings: Mild normal background parenchymal enhancement is identified within the left breast.  The right breast is much smaller than the left.  Abnormal irregular enhancement throughout the majority of the right breast is noted with skin thickening, compatible with biopsy-proven neoplasm and lymphovascular/dermal invasion.  Biopsy clip artifact within the upper central right breast is identified.  Enlarged level I right axillary lymph nodes are present. There are no abnormal appearing or enlarged lymph nodes within the internal mammary  or left axillary regions.  No abnormal areas of enhancement are identified within the left breast.  Mild non mass-like enhancement within the pre sternal fat is present.  This is nonspecific and could represent mild inflammation but neoplasm is difficult to exclude.  The visualized upper liver and bony structures are within normal limits.  IMPRESSION: Diffuse right breast neoplasm and enlarged level I right axillary lymph nodes compatible with lymphatic spread.  Mild non mass-like enhancement within the presternal fat - second look ultrasound / sampling would be helpful to exclude neoplasm.  No evidence of LEFT breast malignancy.  BI-RADS CATEGORY 6:  Known biopsy-proven malignancy - appropriate action should be taken.  RECOMMENDATION: Consider second look ultrasound / sampling of the mild non mass- like enhancement in the pre sternal fat, if neoplasm in this region would affect treatment.  THREE-DIMENSIONAL MR IMAGE RENDERING ON INDEPENDENT WORKSTATION:  Three-dimensional MR images were rendered by post-processing of the  original MR data on an independent workstation.  The three- dimensional MR images were interpreted, and findings were reported in the accompanying complete MRI report for this study.   Original Report Authenticated By: Rosendo Gros, M.D.    Dg Chest Port 1 View  07/21/2012  *RADIOLOGY REPORT*  Clinical Data: Central line placement.  Rule out pneumothorax.  PORTABLE CHEST - 1 VIEW  Comparison: 02/24/2011.  Findings: Left central line placed with the tip at the level of the proximal superior vena cava. No gross pneumothorax.  Confluence of shadows right upper lung relatively similar to the prior exam.  Attention to this on follow-up two-view chest.  Cardiomegaly.  Crowding of vessels/atelectatic changes medial aspect lung bases.  Central pulmonary vascular prominence.  IMPRESSION: Left central line placed with the tip at the level of the proximal superior vena cava. No gross pneumothorax.  Please see above discussion.  This is a call report.   Original Report Authenticated By: Fuller Canada, M.D.    Dg Fluoro Guide Cv Line-no Report  07/21/2012  CLINICAL DATA: port placement   FLOURO GUIDE CV LINE  Fluoroscopy was utilized by the requesting physician.  No radiographic  interpretation.      ASSESSMENT: 66 year old female with  #1 inflammatory right breast cancer with positive lymph nodes the tumor is ER negative PR negative HER-2/neu positive. Patient is  to receive neoadjuvant chemotherapy intially Consisting of FEC q 2 weeks x 4 cycles. Then she will receive taxol and herceptin q week x 12 weeks.Once patient completes her chemotherapy then we will get restaging MRIs performed to evaluate response to therapy. Patient does understand that she will most likely need a mastectomy because she does have inflammatory breast cancer.  #2 risks and benefits of chemotherapy were clearly explained to the patient.  #3 patient will return in one week's time for interim labs.   PLAN:   #1 proceed with patient  scheduled chemotherapy today. She will return in one week's time for followup. She knows to call me with any problems questions or concerns.   All questions were answered. The patient knows to call the clinic with any problems, questions or concerns. We can certainly see the patient much sooner if necessary.  I spent 25 minutes counseling the patient face to face. The total time spent in the appointment was 30 minutes.    Drue Second, MD Medical/Oncology [facility] [phone] (beeper) [phone] (Office)  07/23/2012, 12:50 PM

## 2012-07-23 NOTE — Patient Instructions (Signed)
[facility] Discharge Instructions for Patients Receiving Chemotherapy  Today you received the following chemotherapy agents 5 Fu/ Ellence/Cytoxan To help prevent nausea and vomiting after your treatment, we encourage you to take your nausea medication as prescribed.  If you develop nausea and vomiting that is not controlled by your nausea medication, call the clinic. If it is after clinic hours your family physician or the after hours number for the clinic or go to the Emergency Department.   BELOW ARE SYMPTOMS THAT SHOULD BE REPORTED IMMEDIATELY:  *FEVER GREATER THAN 100.5 F  *CHILLS WITH OR WITHOUT FEVER  NAUSEA AND VOMITING THAT IS NOT CONTROLLED WITH YOUR NAUSEA MEDICATION  *UNUSUAL SHORTNESS OF BREATH  *UNUSUAL BRUISING OR BLEEDING  TENDERNESS IN MOUTH AND THROAT WITH OR WITHOUT PRESENCE OF ULCERS  *URINARY PROBLEMS  *BOWEL PROBLEMS  UNUSUAL RASH Items with * indicate a potential emergency and should be followed up as soon as possible.  One of the nurses will contact you 24 hours after your treatment. Please let the nurse know about any problems that you may have experienced. Feel free to call the clinic you have any questions or concerns. The clinic phone number is [phone].   I have been informed and understand all the instructions given to me. I know to contact the clinic, my physician, or go to the Emergency Department if any problems should occur. I do not have any questions at this time, but understand that I may call the clinic during office hours   should I have any questions or need assistance in obtaining follow up care.    __________________________________________  _____________  __________ Signature of Patient or Authorized Representative            Date                   Time    __________________________________________ Nurse's Signature

## 2012-07-24 ENCOUNTER — Ambulatory Visit (HOSPITAL_BASED_OUTPATIENT_CLINIC_OR_DEPARTMENT_OTHER): Payer: Medicare Other

## 2012-07-24 VITALS — BP 150/77 | HR 92 | Temp 99.1°F | Resp 20

## 2012-07-24 DIAGNOSIS — C773 Secondary and unspecified malignant neoplasm of axilla and upper limb lymph nodes: Secondary | ICD-10-CM | POA: Diagnosis not present

## 2012-07-24 DIAGNOSIS — C50919 Malignant neoplasm of unspecified site of unspecified female breast: Secondary | ICD-10-CM | POA: Diagnosis not present

## 2012-07-24 DIAGNOSIS — C50119 Malignant neoplasm of central portion of unspecified female breast: Secondary | ICD-10-CM

## 2012-07-24 DIAGNOSIS — Z5189 Encounter for other specified aftercare: Secondary | ICD-10-CM | POA: Diagnosis not present

## 2012-07-24 MED ORDER — PEGFILGRASTIM INJECTION 6 MG/0.6ML
6.0000 mg | Freq: Once | SUBCUTANEOUS | Status: AC
  Administered 2012-07-24: 6 mg via SUBCUTANEOUS

## 2012-07-24 NOTE — Progress Notes (Signed)
Patient ID: Jessica Wells, female   DOB: 1946-02-24, 66 y.o.   MRN: [id number] Referring Physician: Dr Welton Flakes Oncologist: Dr Welton Flakes PCP: Dr Zachery Dauer General Surgeon: Dr Dwain Sarna Vascular Surgeon: Dr Edilia Bo Cardiologist: Dr Anne Fu.  HPI:  66 year old female recently diagnosed with breast cancer 07/15/12. She has a history of asthma, PAT but no mediatations required.   She will start chemotherapy today. She will receive neoadjuvant therapy every other week for a total of 8 weeks. Final plans to be determined.   ECHO EF 60-65%  She is referred to HF clinic by Dr Welton Flakes due to upcoming breast cancer treatment. Denies SOB/PND/Orthopnea/CP. BP at home SBP 120s. She walks daily for 1 hour. 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   Review of Systems:     Cardiac Review of Systems: {Y] = yes [ ]  = no  Chest Pain [    ]  Resting SOB [   ] Exertional SOB  [  ]  Orthopnea [  ]   Pedal Edema [   ]    Palpitations [ Y ] Syncope  [  ]   Presyncope [   ]  General Review of Systems: [Y] = yes [  ]=no Constitional: recent weight change [  ]; anorexia [  ]; fatigue [  ]; nausea [  ]; night sweats [  ]; fever [  ]; or chills [  ];                                                                                                                                          Dental: poor dentition[  ]; Last Dentist visit:   Eye : blurred vision [  ]; diplopia [   ]; vision changes [  ];  Amaurosis fugax[  ]; Resp: cough [  ];  wheezing[  ];  hemoptysis[  ]; shortness of breath[  ]; paroxysmal nocturnal dyspnea[  ]; dyspnea on exertion[  ]; or orthopnea[  ];  GI:  gallstones[  ], vomiting[  ];  dysphagia[  ]; melena[  ];  hematochezia [  ]; heartburn[  ];   Hx of  Colonoscopy[  ]; GU: kidney stones [  ]; hematuria[  ];   dysuria [  ];  nocturia[  ];  history of     obstruction [  ];                 Skin: rash, swelling[  ];, hair loss[  ];  peripheral edema[  ];  or itching[  ]; Musculosketetal: myalgias[  ];  joint  swelling[  ];  joint erythema[  ];  joint pain[  ];  back pain[  ];  Heme/Lymph: bruising[  ];  bleeding[  ];  anemia[  ];  Neuro: TIA[  ];  headaches[  ];  stroke[  ];  vertigo[  ];  seizures[  ];  paresthesias[  ];  difficulty walking[  ];  Psych:depression[  ]; anxiety[  ];  Endocrine: diabetes[  ];  thyroid dysfunction[  ];  Immunizations: Flu [  ]; Pneumococcal[  ];  Other:    Past Medical History  Diagnosis Date  . Asthma   . GERD (gastroesophageal reflux disease)   . PONV (postoperative nausea and vomiting)     severe  . Arthritis   . Dysrhythmia     PAT-sees dr Laurence Compton meds  . Cancer     rt br ca    Current Outpatient Prescriptions  Medication Sig Dispense Refill  . albuterol (PROVENTIL HFA;VENTOLIN HFA) 108 (90 BASE) MCG/ACT inhaler Inhale 2 puffs into the lungs every 6 (six) hours as needed.      Marland Kitchen aspirin EC 81 MG tablet Take 162 mg by mouth every 6 (six) hours as needed. For blood clot symptoms      . calcium-vitamin D (OSCAL WITH D) 500-200 MG-UNIT per tablet Take 1 tablet by mouth daily.      . fish oil-omega-3 fatty acids 1000 MG capsule Take 2 g by mouth daily.      . Flaxseed, Linseed, (FLAX SEEDS PO) Take 1 tablet by mouth daily.      . fluticasone (FLONASE) 50 MCG/ACT nasal spray Place 1 spray into the nose daily.      Marland Kitchen glucosamine-chondroitin 500-400 MG tablet Take 1 tablet by mouth daily.      Marland Kitchen lidocaine-prilocaine (EMLA) cream Apply topically as needed.  30 g  5  . LORazepam (ATIVAN) 0.5 MG tablet Take 1 tablet (0.5 mg total) by mouth every 6 (six) hours as needed (Nausea or vomiting).  30 tablet  0  . Multiple Vitamin (MULTIVITAMIN WITH MINERALS) TABS Take 1 tablet by mouth daily.      Marland Kitchen omeprazole (PRILOSEC) 20 MG capsule Take 20 mg by mouth daily.      . ondansetron (ZOFRAN ODT) 8 MG disintegrating tablet Take 1 tablet (8 mg total) by mouth every 8 (eight) hours as needed for nausea.  20 tablet  0  . ondansetron (ZOFRAN) 8 MG tablet Take 1 tablet two  times a day as needed for nausea or vomiting starting on the third day after chemotherapy.  30 tablet  1  . oxyCODONE-acetaminophen (ROXICET) 5-325 MG per tablet Take 15 tablets by mouth every 4 (four) hours as needed for pain.  30 tablet  0  . prochlorperazine (COMPAZINE) 10 MG tablet Take 1 tablet (10 mg total) by mouth every 6 (six) hours as needed (Nausea or vomiting).  30 tablet  1  . prochlorperazine (COMPAZINE) 25 MG suppository Place 1 suppository (25 mg total) rectally every 12 (twelve) hours as needed for nausea.  12 suppository  3  . dexamethasone (DECADRON) 4 MG tablet Take 2 tablets by mouth once a day on the day after chemotherapy and then take 2 tablets two times a day for 2 days. Take with food.  30 tablet  1   No current facility-administered medications for this encounter.   Facility-Administered Medications Ordered in Other Encounters  Medication Dose Route Frequency Provider Last Rate Last Dose  . pegfilgrastim (NEULASTA) injection 6 mg  6 mg Subcutaneous Once Victorino December, MD   6 mg at 07/24/12 1108  . DISCONTD: sodium chloride 0.9 % injection 10 mL  10 mL Intracatheter PRN Victorino December, MD   10 mL at 07/23/12 1650     Allergies  Allergen Reactions  . Anesthetics, Amide  Nausea And Vomiting    Projectile vomitting-Nausea- with 24hrs of dry heaves. With any anesthetics  . Bactrim (Sulfamethoxazole W-Trimethoprim) Other (See Comments)    Massive diarrhea, nausea vomiting, platelets dropped, hemoglobin dropped, admitted to hospital  . Sulfa Antibiotics Nausea And Vomiting and Other (See Comments)    Crazy feeling in head Life threatening reaction, decreased blood counts  . Codeine Nausea And Vomiting  . Codeine Phosphate Nausea And Vomiting  . Eggs Or Egg-Derived Products Hives    History   Social History  . Marital Status: Single    Spouse Name: N/A    Number of Children: N/A  . Years of Education: N/A   Occupational History  . Not on file.   Social  History Main Topics  . Smoking status: Former Smoker    Quit date: 07/21/1979  . Smokeless tobacco: Never Used  . Alcohol Use: No  . Drug Use: No  . Sexually Active: Not Currently   Other Topics Concern  . Not on file   Social History Narrative  . No narrative on file    Family History  Problem Relation Age of Onset  . Diabetes Mother   . Heart disease Mother   . Hypertension Mother   . Hyperlipidemia Mother   . Cancer Mother     breast  . Heart disease Maternal Aunt   . Heart disease Maternal Uncle   . Heart disease Maternal Grandmother     PHYSICAL EXAM: Filed Vitals:   07/23/12 1043  BP: 160/90  Pulse: 84   General:  Well appearing. No respiratory difficulty HEENT: normal Neck: supple. no JVD. Carotids 2+ bilat; no bruits. No lymphadenopathy or thryomegaly appreciated. Cor: PMI nondisplaced. Regular rate & rhythm. No rubs, gallops . Systolic murmur 2/6  Lungs: clear Abdomen: soft, nontender, nondistended. No hepatosplenomegaly. No bruits or masses. Good bowel sounds. Extremities: no cyanosis, clubbing, rash, edema Neuro: alert & oriented x 3, cranial nerves grossly intact. moves all 4 extremities w/o difficulty. Affect pleasant.     ASSESSMENT & PLAN:

## 2012-07-26 ENCOUNTER — Telehealth: Payer: Self-pay | Admitting: *Deleted

## 2012-07-26 ENCOUNTER — Ambulatory Visit (HOSPITAL_COMMUNITY)
Admission: RE | Admit: 2012-07-26 | Discharge: 2012-07-26 | Disposition: A | Discharge status: Home / Self Care | Payer: Medicare Other | Source: Ambulatory Visit | Attending: Oncology | Admitting: Oncology

## 2012-07-26 ENCOUNTER — Encounter (HOSPITAL_COMMUNITY)
Admission: RE | Admit: 2012-07-26 | Discharge: 2012-07-26 | Disposition: A | Discharge status: Home / Self Care | Payer: Medicare Other | Source: Ambulatory Visit | Attending: Oncology | Admitting: Oncology

## 2012-07-26 ENCOUNTER — Encounter (INDEPENDENT_AMBULATORY_CARE_PROVIDER_SITE_OTHER): Payer: Medicare Other | Admitting: General Surgery

## 2012-07-26 DIAGNOSIS — R599 Enlarged lymph nodes, unspecified: Secondary | ICD-10-CM | POA: Insufficient documentation

## 2012-07-26 DIAGNOSIS — C773 Secondary and unspecified malignant neoplasm of axilla and upper limb lymph nodes: Secondary | ICD-10-CM | POA: Insufficient documentation

## 2012-07-26 DIAGNOSIS — K449 Diaphragmatic hernia without obstruction or gangrene: Secondary | ICD-10-CM | POA: Insufficient documentation

## 2012-07-26 DIAGNOSIS — C50119 Malignant neoplasm of central portion of unspecified female breast: Secondary | ICD-10-CM

## 2012-07-26 DIAGNOSIS — C50919 Malignant neoplasm of unspecified site of unspecified female breast: Secondary | ICD-10-CM | POA: Insufficient documentation

## 2012-07-26 DIAGNOSIS — R911 Solitary pulmonary nodule: Secondary | ICD-10-CM | POA: Insufficient documentation

## 2012-07-26 DIAGNOSIS — K225 Diverticulum of esophagus, acquired: Secondary | ICD-10-CM | POA: Insufficient documentation

## 2012-07-26 MED ORDER — FLUDEOXYGLUCOSE F - 18 (FDG) INJECTION
18.8000 | Freq: Once | INTRAVENOUS | Status: AC | PRN
  Administered 2012-07-26: 18.8 via INTRAVENOUS

## 2012-07-26 MED ORDER — IOHEXOL 300 MG/ML  SOLN
100.0000 mL | Freq: Once | INTRAMUSCULAR | Status: AC | PRN
  Administered 2012-07-26: 100 mL via INTRAVENOUS

## 2012-07-26 NOTE — Telephone Encounter (Signed)
Message copied by Wandalee Ferdinand on Mon Jul 26, 2012 10:33 AM ------      Message from: Corky Sing      Created: Fri Jul 23, 2012  4:06 PM      Regarding: Chemo Follow up call      Contact: [phone]       Patient's first chemo: Ellence/ 5 Fu/ Cytoxan. Please call. Dr. Welton Flakes patient.            Thanks,      Santina Evans

## 2012-07-26 NOTE — Telephone Encounter (Signed)
No adverse effect from tx--see doc flowsheet

## 2012-07-27 ENCOUNTER — Encounter (HOSPITAL_BASED_OUTPATIENT_CLINIC_OR_DEPARTMENT_OTHER): Payer: Self-pay | Admitting: General Surgery

## 2012-07-27 ENCOUNTER — Telehealth: Payer: Self-pay | Admitting: *Deleted

## 2012-07-27 ENCOUNTER — Ambulatory Visit: Payer: Medicare Other

## 2012-07-27 NOTE — Telephone Encounter (Signed)
Confirmed 09/13/12 genetic appt w/ pt.  Emailed Alisha at [facility] to make her aware.

## 2012-07-28 ENCOUNTER — Other Ambulatory Visit (HOSPITAL_BASED_OUTPATIENT_CLINIC_OR_DEPARTMENT_OTHER): Payer: Medicare Other | Admitting: Lab

## 2012-07-28 ENCOUNTER — Ambulatory Visit (HOSPITAL_BASED_OUTPATIENT_CLINIC_OR_DEPARTMENT_OTHER): Payer: Medicare Other | Admitting: Oncology

## 2012-07-28 ENCOUNTER — Encounter: Payer: Self-pay | Admitting: Oncology

## 2012-07-28 VITALS — BP 119/73 | HR 99 | Temp 99.5°F | Resp 20 | Ht 61.0 in | Wt 142.2 lb

## 2012-07-28 DIAGNOSIS — C773 Secondary and unspecified malignant neoplasm of axilla and upper limb lymph nodes: Secondary | ICD-10-CM | POA: Diagnosis not present

## 2012-07-28 DIAGNOSIS — C50119 Malignant neoplasm of central portion of unspecified female breast: Secondary | ICD-10-CM

## 2012-07-28 DIAGNOSIS — C50919 Malignant neoplasm of unspecified site of unspecified female breast: Secondary | ICD-10-CM | POA: Diagnosis not present

## 2012-07-28 DIAGNOSIS — Z171 Estrogen receptor negative status [ER-]: Secondary | ICD-10-CM

## 2012-07-28 LAB — CBC WITH DIFFERENTIAL/PLATELET
BASO%: 0.7 % (ref 0.0–2.0)
EOS%: 0.5 % (ref 0.0–7.0)
LYMPH%: 16 % (ref 14.0–49.7)
MCH: 29.8 pg (ref 25.1–34.0)
MCHC: 34.8 g/dL (ref 31.5–36.0)
MCV: 85.5 fL (ref 79.5–101.0)
MONO%: 0.5 % (ref 0.0–14.0)
Platelets: 105 10*3/uL — ABNORMAL LOW (ref 145–400)
RBC: 4.2 10*6/uL (ref 3.70–5.45)
WBC: 7.4 10*3/uL (ref 3.9–10.3)
nRBC: 0 % (ref 0–0)

## 2012-07-28 LAB — BASIC METABOLIC PANEL (CC13)
BUN: 19 mg/dL (ref 7.0–26.0)
Calcium: 8.4 mg/dL (ref 8.4–10.4)
Creatinine: 0.8 mg/dL (ref 0.6–1.1)

## 2012-07-28 NOTE — Patient Instructions (Addendum)
You are doing great!!  Your labs from today:  Lab Results  Component Value Date   WBC 7.4 07/28/2012   HGB 12.5 07/28/2012   HCT 35.9 07/28/2012   MCV 85.5 07/28/2012   PLT 105* 07/28/2012    I will see you back in 1 week 08/06/12 with chemotherapy and injection on 08/07/12

## 2012-07-29 ENCOUNTER — Encounter: Payer: Self-pay | Admitting: *Deleted

## 2012-07-29 NOTE — Progress Notes (Signed)
Mailed after appt letter to pt. 

## 2012-08-02 NOTE — Progress Notes (Signed)
OFFICE PROGRESS NOTE  CC  Jessica Alken, MD [address] [place] [address]  DIAGNOSIS: 66 year old female with new diagnosis of inflammatory breast cancer  PRIOR THERAPY: #1 patient was originally seen in my clinic for new diagnosis of inflammatory breast cancer she was referred by Dr. Emelia Loron. She had a mammogram performed That showed an abnormality. That was biopsied and it showed an invasive mammary carcinoma with lymphovascular invasion grade 3 ER negative PR negative HER-2/neu positive.  #2 patient has gone on to have MRI of the breasts performed the MRI shows diffuse right breast neoplasm on a large level I right axillary lymph nodes compatible with lymphatic spread.Patient has had a biopsy of the right axillary lymph node that is compatible with invasive mammary carcinoma.  #3 today patient and will begin FEC 100 with day 2 Neulasta on 07/23/2012  CURRENT THERAPY: Status post cycle 1 day 1 of FEC on 07/23/2012 INTERVAL HISTORY: Jessica Wells 66 y.o. female returns for Followup visit . Post cycle 1 patient looks terrific. She denies any fevers chills night sweats headaches she had no nausea or vomiting and fascia tells me she did not take any of her antiemetics which is quite surprising. She has no peripheral paresthesias no myalgias or arthralgias. Remainder of the 10 point review of systems is negative.  MEDICAL HISTORY: Past Medical History  Diagnosis Date  . Asthma   . GERD (gastroesophageal reflux disease)   . PONV (postoperative nausea and vomiting)     severe  . Arthritis   . Dysrhythmia     PAT-sees dr Laurence Compton meds  . Cancer     rt br ca    ALLERGIES:  is allergic to anesthetics, amide; bactrim; sulfa antibiotics; codeine; codeine phosphate; and eggs or egg-derived products.  MEDICATIONS:  Current Outpatient Prescriptions  Medication Sig Dispense Refill  . albuterol (PROVENTIL HFA;VENTOLIN HFA) 108 (90 BASE) MCG/ACT inhaler Inhale  2 puffs into the lungs every 6 (six) hours as needed.      Marland Kitchen aspirin EC 81 MG tablet Take 162 mg by mouth every 6 (six) hours as needed. For blood clot symptoms      . calcium-vitamin D (OSCAL WITH D) 500-200 MG-UNIT per tablet Take 1 tablet by mouth daily.      Marland Kitchen dexamethasone (DECADRON) 4 MG tablet Take 2 tablets by mouth once a day on the day after chemotherapy and then take 2 tablets two times a day for 2 days. Take with food.  30 tablet  1  . fish oil-omega-3 fatty acids 1000 MG capsule Take 2 g by mouth daily.      . Flaxseed, Linseed, (FLAX SEEDS PO) Take 1 tablet by mouth daily.      . fluticasone (FLONASE) 50 MCG/ACT nasal spray Place 1 spray into the nose daily.      Marland Kitchen glucosamine-chondroitin 500-400 MG tablet Take 1 tablet by mouth daily.      Marland Kitchen lidocaine-prilocaine (EMLA) cream Apply topically as needed.  30 g  5  . LORazepam (ATIVAN) 0.5 MG tablet Take 1 tablet (0.5 mg total) by mouth every 6 (six) hours as needed (Nausea or vomiting).  30 tablet  0  . Multiple Vitamin (MULTIVITAMIN WITH MINERALS) TABS Take 1 tablet by mouth daily.      Marland Kitchen omeprazole (PRILOSEC) 20 MG capsule Take 20 mg by mouth daily.      . ondansetron (ZOFRAN) 8 MG tablet Take 1 tablet two times a day as needed for nausea or vomiting  starting on the third day after chemotherapy.  30 tablet  1  . prochlorperazine (COMPAZINE) 10 MG tablet Take 1 tablet (10 mg total) by mouth every 6 (six) hours as needed (Nausea or vomiting).  30 tablet  1  . prochlorperazine (COMPAZINE) 25 MG suppository Place 1 suppository (25 mg total) rectally every 12 (twelve) hours as needed for nausea.  12 suppository  3    SURGICAL HISTORY:  Past Surgical History  Procedure Date  . Dilation and curettage of uterus     x3  . Tonsillectomy   . Colonoscopy   . Portacath placement 07/21/2012    Procedure: INSERTION PORT-A-CATH;  Surgeon: Emelia Loron, MD;  Location: [facility];  Service: General;  Laterality: Left;     REVIEW OF SYSTEMS:  Pertinent items are noted in HPI.   PHYSICAL EXAMINATION: General appearance: alert and cooperative Lymph nodes: noted to have a palpable right axillary lymph node measuring about 2 cm. No supraclavicular adenopathy cervical lymphadenopathy inguinal or left axillary lymphadenopathy. Resp: clear to auscultation bilaterally Back: symmetric, no curvature. ROM normal. No CVA tenderness. Cardio: regular rate and rhythm, S1, S2 normal, no murmur, click, rub or gallop GI: soft, non-tender; bowel sounds normal; no masses,  no organomegaly Extremities: extremities normal, atraumatic, no cyanosis or edema Neurologic: Grossly normal Right breast reveals grossly enlarged breast with Dimpling of the skin And retraction of the nipple.Left breast no masses or nipple discharge or skin changes.  ECOG PERFORMANCE STATUS: 0 - Asymptomatic  Blood pressure 119/73, pulse 99, temperature 99.5 F (37.5 C), temperature source Oral, resp. rate 20, height 5\' 1"  (1.549 m), weight 142 lb 3.2 oz (64.501 kg).  LABORATORY DATA: Lab Results  Component Value Date   WBC 7.4 07/28/2012   HGB 12.5 07/28/2012   HCT 35.9 07/28/2012   MCV 85.5 07/28/2012   PLT 105* 07/28/2012      Chemistry      Component Value Date/Time   NA 138 07/28/2012 1334   NA 140 07/19/2012 1538   K 3.7 07/28/2012 1334   K 3.7 07/19/2012 1538   CL 105 07/28/2012 1334   CL 103 07/19/2012 1538   CO2 27 07/28/2012 1334   CO2 27 07/19/2012 1538   BUN 19.0 07/28/2012 1334   BUN 15 07/19/2012 1538   CREATININE 0.8 07/28/2012 1334   CREATININE 0.77 07/19/2012 1538      Component Value Date/Time   CALCIUM 8.4 07/28/2012 1334   CALCIUM 9.3 07/19/2012 1538   ALKPHOS 60 07/19/2012 1538   AST 17 07/19/2012 1538   ALT 16 07/19/2012 1538   BILITOT 0.2* 07/19/2012 1538       RADIOGRAPHIC STUDIES:  Mr Breast Bilateral W Wo Contrast  07/19/2012  *RADIOLOGY REPORT*  Clinical Data: 66 year old female with newly diagnosed right invasive  mammary carcinoma and mammary carcinoma in situ with lymphovascular invasion.  BILATERAL BREAST MRI WITH AND WITHOUT CONTRAST  Technique: Multiplanar, multisequence MR images of both breasts were obtained prior to and following the intravenous administration of 14ml of Multihance .  Three dimensional images were evaluated at the independent DynaCad workstation.  Comparison:  06/25/2012 and prior mammograms from [facility] dating back to 08/07/2010.  05/25/2012 and 07/12/2012 ultrasounds.  Findings: Mild normal background parenchymal enhancement is identified within the left breast.  The right breast is much smaller than the left.  Abnormal irregular enhancement throughout the majority of the right breast is noted with skin thickening, compatible with biopsy-proven neoplasm and lymphovascular/dermal invasion.  Biopsy  clip artifact within the upper central right breast is identified.  Enlarged level I right axillary lymph nodes are present. There are no abnormal appearing or enlarged lymph nodes within the internal mammary or left axillary regions.  No abnormal areas of enhancement are identified within the left breast.  Mild non mass-like enhancement within the pre sternal fat is present.  This is nonspecific and could represent mild inflammation but neoplasm is difficult to exclude.  The visualized upper liver and bony structures are within normal limits.  IMPRESSION: Diffuse right breast neoplasm and enlarged level I right axillary lymph nodes compatible with lymphatic spread.  Mild non mass-like enhancement within the presternal fat - second look ultrasound / sampling would be helpful to exclude neoplasm.  No evidence of LEFT breast malignancy.  BI-RADS CATEGORY 6:  Known biopsy-proven malignancy - appropriate action should be taken.  RECOMMENDATION: Consider second look ultrasound / sampling of the mild non mass- like enhancement in the pre sternal fat, if neoplasm in this region would affect treatment.   THREE-DIMENSIONAL MR IMAGE RENDERING ON INDEPENDENT WORKSTATION:  Three-dimensional MR images were rendered by post-processing of the original MR data on an independent workstation.  The three- dimensional MR images were interpreted, and findings were reported in the accompanying complete MRI report for this study.   Original Report Authenticated By: Rosendo Gros, M.D.    Dg Chest Port 1 View  07/21/2012  *RADIOLOGY REPORT*  Clinical Data: Central line placement.  Rule out pneumothorax.  PORTABLE CHEST - 1 VIEW  Comparison: 02/24/2011.  Findings: Left central line placed with the tip at the level of the proximal superior vena cava. No gross pneumothorax.  Confluence of shadows right upper lung relatively similar to the prior exam.  Attention to this on follow-up two-view chest.  Cardiomegaly.  Crowding of vessels/atelectatic changes medial aspect lung bases.  Central pulmonary vascular prominence.  IMPRESSION: Left central line placed with the tip at the level of the proximal superior vena cava. No gross pneumothorax.  Please see above discussion.  This is a call report.   Original Report Authenticated By: Fuller Canada, M.D.    Dg Fluoro Guide Cv Line-no Report  07/21/2012  CLINICAL DATA: port placement   FLOURO GUIDE CV LINE  Fluoroscopy was utilized by the requesting physician.  No radiographic  interpretation.      ASSESSMENT: 66 year old female with  #1 inflammatory right breast cancer with positive lymph nodes the tumor is ER negative PR negative HER-2/neu positive. Patient is  to receive neoadjuvant chemotherapy intially Consisting of FEC q 2 weeks x 4 cycles. Then she will receive taxol and herceptin q week x 12 weeks.Once patient completes her chemotherapy then we will get restaging MRIs performed to evaluate response to therapy. Patient does understand that she will most likely need a mastectomy because she does have inflammatory breast cancer.  #2 risks and benefits of chemotherapy were  clearly explained to the patient.  #3 labs looked terrific and overall she tolerated the chemotherapy very well.   PLAN:  #1 patient will return in one week's time for cycle #2 of FEC with day 2 Neulasta.  #2 in the interim patient knows to call me with any problems questions or concerns.  All questions were answered. The patient knows to call the clinic with any problems, questions or concerns. We can certainly see the patient much sooner if necessary.  I spent 25 minutes counseling the patient face to face. The total time spent in the  appointment was 30 minutes.    Drue Second, MD Medical/Oncology [facility] [phone] (beeper) [phone] (Office)

## 2012-08-03 ENCOUNTER — Encounter: Payer: Self-pay | Admitting: Radiation Oncology

## 2012-08-03 NOTE — Progress Notes (Signed)
66 year old female.   Inflammatory right breast cancer with positive lymph nodes ER/PR negative and HER 2 positive. Receiving neoadjuvant chemotherapy intially consisting of FEC q 2 weeks x 4 cycles. Taxol and Herceptin every week x 12. Per Dr. Milta Deiters note patient understands she will likely need a mastectomy.  AX: anesthetics, amide, bactrim, sulfa, codeine, eggs No hx of radiation therapy No indication of a pacemaker

## 2012-08-04 ENCOUNTER — Encounter: Payer: Self-pay | Admitting: Radiation Oncology

## 2012-08-04 ENCOUNTER — Ambulatory Visit
Admission: RE | Admit: 2012-08-04 | Discharge: 2012-08-04 | Disposition: A | Discharge status: Home / Self Care | Payer: Medicare Other | Source: Ambulatory Visit | Attending: Radiation Oncology | Admitting: Radiation Oncology

## 2012-08-04 VITALS — BP 129/83 | HR 101 | Temp 98.1°F | Resp 20 | Wt 137.7 lb

## 2012-08-04 DIAGNOSIS — Z79899 Other long term (current) drug therapy: Secondary | ICD-10-CM | POA: Diagnosis not present

## 2012-08-04 DIAGNOSIS — C50119 Malignant neoplasm of central portion of unspecified female breast: Secondary | ICD-10-CM

## 2012-08-04 DIAGNOSIS — Z803 Family history of malignant neoplasm of breast: Secondary | ICD-10-CM | POA: Insufficient documentation

## 2012-08-04 DIAGNOSIS — J45909 Unspecified asthma, uncomplicated: Secondary | ICD-10-CM | POA: Diagnosis not present

## 2012-08-04 DIAGNOSIS — K219 Gastro-esophageal reflux disease without esophagitis: Secondary | ICD-10-CM | POA: Diagnosis not present

## 2012-08-04 DIAGNOSIS — I471 Supraventricular tachycardia: Secondary | ICD-10-CM | POA: Insufficient documentation

## 2012-08-04 DIAGNOSIS — C50919 Malignant neoplasm of unspecified site of unspecified female breast: Secondary | ICD-10-CM | POA: Diagnosis not present

## 2012-08-04 HISTORY — DX: Other supraventricular tachycardia: I47.19

## 2012-08-04 HISTORY — DX: Supraventricular tachycardia: I47.1

## 2012-08-04 HISTORY — DX: Allergy, unspecified, initial encounter: T78.40XA

## 2012-08-04 NOTE — Progress Notes (Signed)
Please see the Nurse Progress Note in the MD Initial Consult Encounter for this patient. 

## 2012-08-04 NOTE — Progress Notes (Signed)
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

## 2012-08-04 NOTE — Progress Notes (Signed)
Radiation Oncology         [id number]) [phone] ________________________________  Initial outpatient Consultation  Name: Jessica Wells MRN: [id number]  Date: 08/04/2012  DOB: 11/15/1946  REFERRING PHYSICIAN: Victorino December, MD  DIAGNOSIS: The encounter diagnosis was Cancer of central portion of female breast.  HISTORY OF PRESENT ILLNESS::Jessica Wells is a 66 y.o. female  who presented with a red right breast. She was treated for several weeks with antibiotics. She apparently had a life-threatening reaction to sulfa and required 2 hospital admissions. Her redness actually resolved but then came back right before July 4. She was then placed on 2 weeks of doxycycline. She presented back to [facility] for followup imaging and underwent a ultrasound which showed a 4 cm area under the nipple. She was seen by Dr. Dwain Sarna and placed on Levaquin. Aspiration was ordered. This turned out to be a core biopsy which showed invasive mammary carcinoma with lymphovascular invasion. A punch biopsy of the skin was performed in the office that day which showed invasive mammary carcinoma which was grade 3 involving the dermis. Lymphovascular invasion was noted. An MRI of the bilateral breasts was performed on 07/19/2012 which showed abnormal irregular enhancement through the right breast with skin thickening. Enlarged level on right axillary lymph nodes were noted. No abnormal internal mammary or left axillary lymph nodes were noted. No abnormal areas of enhancement were noted within the left breast. A biopsy of a right axillary lymph node was again invasive mammary carcinoma which was ER and PR negative but HER-2 positive. She was referred to medical oncology and was seen by Dr. Welton Flakes. She recommended neoadjuvant chemotherapy and Mr. Karie Schwalbe. is had her first cycle. I should mention that a CT of the chest on 07/26/2012 showed no evidence of metastatic disease. A CT of abdomen and pelvis was also negative. A PET scan on 07/26/2012 just showed  hypermetabolic right breast tissue, right axillary lymph nodes and was negative for the rest of the body. Jessica Wells. She is feeling well. She is a bit tearful regarding her diagnosis. She did her mother in being treated for breast cancer in her 9s. She thinks her breast may have gotten a little bit less red after her first round of chemotherapy.  PREVIOUS RADIATION THERAPY: No  PAST MEDICAL HISTORY:  has a past medical history of Asthma; GERD (gastroesophageal reflux disease); PONV (postoperative nausea and vomiting); Dysrhythmia; Breast cancer; Allergy; PAT (paroxysmal atrial tachycardia); and Arthritis.    PAST SURGICAL HISTORY: Past Surgical History  Procedure Date  . Dilation and curettage of uterus     x3  . Tonsillectomy   . Colonoscopy   . Portacath placement 07/21/2012    Procedure: INSERTION PORT-A-CATH;  Surgeon: Emelia Loron, MD;  Location: [facility];  Service: General;  Laterality: Left;    FAMILY HISTORY: family history includes Cancer in her father; Cancer (age of onset:83) in her mother; Diabetes in her mother; Heart disease in her maternal aunt, maternal grandmother, maternal uncle, and mother; Hyperlipidemia in her mother; and Hypertension in her mother.  SOCIAL HISTORY:  reports that she quit smoking about 33 years ago. She has never used smokeless tobacco. She reports that she does not drink alcohol or use illicit drugs.  ALLERGIES: Anesthetics, amide; Bactrim; Sulfa antibiotics; Codeine; Codeine phosphate; and Eggs or egg-derived products  MEDICATIONS:  Current Outpatient Prescriptions  Medication Sig Dispense Refill  . albuterol (PROVENTIL HFA;VENTOLIN HFA) 108 (90 BASE) MCG/ACT inhaler Inhale 2 puffs  into the lungs every 6 (six) hours as needed.      Marland Kitchen aspirin EC 81 MG tablet Take 162 mg by mouth every 6 (six) hours as needed. For blood clot symptoms      . calcium-vitamin D (OSCAL WITH D) 500-200 MG-UNIT per tablet  Take 1 tablet by mouth daily.      Marland Kitchen dexamethasone (DECADRON) 4 MG tablet Take 2 tablets by mouth once a day on the day after chemotherapy and then take 2 tablets two times a day for 2 days. Take with food.  30 tablet  1  . fish oil-omega-3 fatty acids 1000 MG capsule Take 2 g by mouth daily.      . Flaxseed, Linseed, (FLAX SEEDS PO) Take 1 tablet by mouth daily.      . fluticasone (FLONASE) 50 MCG/ACT nasal spray Place 1 spray into the nose daily.      Marland Kitchen glucosamine-chondroitin 500-400 MG tablet Take 1 tablet by mouth daily.      Marland Kitchen HYDROcodone-acetaminophen (NORCO/VICODIN) 5-325 MG per tablet       . lidocaine-prilocaine (EMLA) cream Apply topically as needed.  30 g  5  . LORazepam (ATIVAN) 0.5 MG tablet Take 1 tablet (0.5 mg total) by mouth every 6 (six) hours as needed (Nausea or vomiting).  30 tablet  0  . Multiple Vitamin (MULTIVITAMIN WITH MINERALS) TABS Take 1 tablet by mouth daily.      Marland Kitchen omeprazole (PRILOSEC) 20 MG capsule Take 20 mg by mouth daily.      . ondansetron (ZOFRAN) 8 MG tablet Take 1 tablet two times a day as needed for nausea or vomiting starting on the third day after chemotherapy.  30 tablet  1  . ondansetron (ZOFRAN-ODT) 8 MG disintegrating tablet       . prochlorperazine (COMPAZINE) 10 MG tablet Take 1 tablet (10 mg total) by mouth every 6 (six) hours as needed (Nausea or vomiting).  30 tablet  1  . prochlorperazine (COMPAZINE) 25 MG suppository Place 1 suppository (25 mg total) rectally every 12 (twelve) hours as needed for nausea.  12 suppository  3  . oxyCODONE-acetaminophen (PERCOCET/ROXICET) 5-325 MG per tablet         REVIEW OF SYSTEMS:  A 15 point review of systems is documented in the electronic medical record. This was obtained by the nursing staff. However, I reviewed this with the patient to discuss relevant findings and make appropriate changes.  Pertinent items are noted in HPI.   PHYSICAL EXAM:  weight is 137 lb 11.2 oz (62.46 kg). Her oral temperature is  98.1 F (36.7 C). Her blood pressure is 129/83 and her pulse is 101. Her respiration is 20.   Marland Kitchen She is a pleasant female who appears younger than her stated age. She has no palpable supraclavicular cervical adenopathy. She does have palpable axillary lymph nodes on the right. Nothing palpable on the left. Her left breast is negative for any palpable abnormalities. Her right breast is thickened and heart around the nipple area were complex. There is a faint pink tone to the skin which extends in about a 5 cm radius from the nipple. Her nipple is inverted. She is alert and oriented x3. She is in no distress.  LABORATORY DATA:  Lab Results  Component Value Date   WBC 7.4 07/28/2012   HGB 12.5 07/28/2012   HCT 35.9 07/28/2012   MCV 85.5 07/28/2012   PLT 105* 07/28/2012   Lab Results  Component Value Date  NA 138 07/28/2012   K 3.7 07/28/2012   CL 105 07/28/2012   CO2 27 07/28/2012   Lab Results  Component Value Date   ALT 16 07/19/2012   AST 17 07/19/2012   ALKPHOS 60 07/19/2012   BILITOT 0.2* 07/19/2012     RADIOGRAPHY: Ct Chest W Contrast  07/26/2012  *RADIOLOGY REPORT*  Clinical Data:  New diagnosis breast cancer. Chemotherapy ongoing. Evaluate for metastasis.  CT CHEST, ABDOMEN AND PELVIS WITH CONTRAST  Technique:  Multidetector CT imaging of the chest, abdomen and pelvis was performed following the standard protocol during bolus administration of intravenous contrast.  Contrast: OMNIPAQUE IOHEXOL 300 MG/ML  SOLN  Comparison:  PET CT scan 08/26 1013  CT CHEST  Findings:  Irregular mass-like densities in the right breast tissue.  There is skin thickening of the right breast to 7 mm (image 28).  There are several mildly enlarged irregular right axillary lymph nodes.  The largest measures 14 mm short axis (image 14).  No evidence of supraclavicular or  infraclavicular enlarged lymph nodes.  No enlarged internal mammary lymph nodes.  No mediastinal or hilar lymphadenopathy.  No pericardial fluid.  Review lung parenchyma demonstrates no suspicious pulmonary nodules.  Ill-defined the 3 mm nodule is noted in the right upper lobe (image 24).  IMPRESSION:  1.  Mass-like thickening within the right breast with associated skin thickening consistent with primary breast carcinoma. 2.  Evidence for local right axillary nodal metastasis. 3.  No evidence of central nodal metastasis or mediastinal nodal metastasis. 4.  Small left pulmonary nodule is likely benign.  CT ABDOMEN AND PELVIS  Findings:  No focal hepatic lesion.  The gallbladder, pancreas, spleen, adrenal glands, and kidneys are normal.  The stomach, small bowel, and colon are normal. Hiatal hernia is noted.  Distal esophageal diverticulum (image 44) measuring  23 mm.  Abdominal aorta normal caliber.  No retroperitoneal periportal lymph fat and myopathy.  No free fluid the pelvis.  No pelvic lymphadenopathy. Review of bone windows demonstrates no aggressive osseous lesions.  IMPRESSION:  1.  No evidence of metastasis within the abdomen or pelvis.  2.  Hiatal hernia and distal esophageal diverticulum.   Original Report Authenticated By: Genevive Bi, M.D.    Ct Abdomen Pelvis W Contrast  07/26/2012  *RADIOLOGY REPORT*  Clinical Data:  New diagnosis breast cancer. Chemotherapy ongoing. Evaluate for metastasis.  CT CHEST, ABDOMEN AND PELVIS WITH CONTRAST  Technique:  Multidetector CT imaging of the chest, abdomen and pelvis was performed following the standard protocol during bolus administration of intravenous contrast.  Contrast: OMNIPAQUE IOHEXOL 300 MG/ML  SOLN  Comparison:  PET CT scan 08/26 1013  CT CHEST  Findings:  Irregular mass-like densities in the right breast tissue.  There is skin thickening of the right breast to 7 mm (image 28).  There are several mildly enlarged irregular right axillary lymph nodes.  The largest measures 14 mm short axis (image 14).  No evidence of supraclavicular or  infraclavicular enlarged lymph nodes.  No enlarged  internal mammary lymph nodes.  No mediastinal or hilar lymphadenopathy.  No pericardial fluid. Review lung parenchyma demonstrates no suspicious pulmonary nodules.  Ill-defined the 3 mm nodule is noted in the right upper lobe (image 24).  IMPRESSION:  1.  Mass-like thickening within the right breast with associated skin thickening consistent with primary breast carcinoma. 2.  Evidence for local right axillary nodal metastasis. 3.  No evidence of central nodal metastasis or mediastinal nodal metastasis.  4.  Small left pulmonary nodule is likely benign.  CT ABDOMEN AND PELVIS  Findings:  No focal hepatic lesion.  The gallbladder, pancreas, spleen, adrenal glands, and kidneys are normal.  The stomach, small bowel, and colon are normal. Hiatal hernia is noted.  Distal esophageal diverticulum (image 44) measuring  23 mm.  Abdominal aorta normal caliber.  No retroperitoneal periportal lymph fat and myopathy.  No free fluid the pelvis.  No pelvic lymphadenopathy. Review of bone windows demonstrates no aggressive osseous lesions.  IMPRESSION:  1.  No evidence of metastasis within the abdomen or pelvis.  2.  Hiatal hernia and distal esophageal diverticulum.   Original Report Authenticated By: Genevive Bi, M.D.    Mr Breast Bilateral W Wo Contrast  07/19/2012  *RADIOLOGY REPORT*  Clinical Data: 66 year old female with newly diagnosed right invasive mammary carcinoma and mammary carcinoma in situ with lymphovascular invasion.  BILATERAL BREAST MRI WITH AND WITHOUT CONTRAST  Technique: Multiplanar, multisequence MR images of both breasts were obtained prior to and following the intravenous administration of 14ml of Multihance .  Three dimensional images were evaluated at the independent DynaCad workstation.  Comparison:  06/25/2012 and prior mammograms from [facility] dating back to 08/07/2010.  05/25/2012 and 07/12/2012 ultrasounds.  Findings: Mild normal background parenchymal enhancement is identified within the left  breast.  The right breast is much smaller than the left.  Abnormal irregular enhancement throughout the majority of the right breast is noted with skin thickening, compatible with biopsy-proven neoplasm and lymphovascular/dermal invasion.  Biopsy clip artifact within the upper central right breast is identified.  Enlarged level I right axillary lymph nodes are present. There are no abnormal appearing or enlarged lymph nodes within the internal mammary or left axillary regions.  No abnormal areas of enhancement are identified within the left breast.  Mild non mass-like enhancement within the pre sternal fat is present.  This is nonspecific and could represent mild inflammation but neoplasm is difficult to exclude.  The visualized upper liver and bony structures are within normal limits.  IMPRESSION: Diffuse right breast neoplasm and enlarged level I right axillary lymph nodes compatible with lymphatic spread.  Mild non mass-like enhancement within the presternal fat - second look ultrasound / sampling would be helpful to exclude neoplasm.  No evidence of LEFT breast malignancy.  BI-RADS CATEGORY 6:  Known biopsy-proven malignancy - appropriate action should be taken.  RECOMMENDATION: Consider second look ultrasound / sampling of the mild non mass- like enhancement in the pre sternal fat, if neoplasm in this region would affect treatment.  THREE-DIMENSIONAL MR IMAGE RENDERING ON INDEPENDENT WORKSTATION:  Three-dimensional MR images were rendered by post-processing of the original MR data on an independent workstation.  The three- dimensional MR images were interpreted, and findings were reported in the accompanying complete MRI report for this study.   Original Report Authenticated By: Rosendo Gros, M.D.    Nm Pet Image Initial (pi) Skull Base To Thigh  07/26/2012  *RADIOLOGY REPORT*  Clinical Data: Initial treatment strategy for breast cancer .  NUCLEAR MEDICINE PET SKULL BASE TO THIGH  Fasting Blood Glucose:   156  Technique:  18.8 mCi F-18 FDG was injected intravenously. CT data was obtained and used for attenuation correction and anatomic localization only.  (This was not acquired as a diagnostic CT examination.) Additional exam technical data entered on technologist worksheet.  Comparison:  CT scan 08/26 1013  Findings:  Neck: No hypermetabolic lymph nodes in the neck.  Chest:  There is  a moderately hypermetabolic irregular right breast tissue with SUV max = 3.6.  There is hypermetabolic skin thickening on the right additionally.  There are mildly hypermetabolic and mildly enlarged right axillary lymph nodes with this SUV max 2.0 (image 56 and 64). Nodes measure up to 14 mm short axis.  Abdomen/Pelvis:  No abnormal hypermetabolic activity within the liver, pancreas, adrenal glands, or spleen.  No hypermetabolic lymph nodes in the abdomen or pelvis.  [place]:  No focal hypermetabolic activity to suggest skeletal metastasis.  IMPRESSION:  1.  Hypermetabolic right breast tissue and skin thickening consistent with primary breast carcinoma. 2.  Mildly  hypermetabolic and mildly enlarged right axillary lymph nodes consistent local nodal metastasis. 3.  No evidence of or distant nodal metastasis or systemic disease.   Original Report Authenticated By: Genevive Bi, M.D.    Dg Chest Port 1 View  07/21/2012  *RADIOLOGY REPORT*  Clinical Data: Central line placement.  Rule out pneumothorax.  PORTABLE CHEST - 1 VIEW  Comparison: 02/24/2011.  Findings: Left central line placed with the tip at the level of the proximal superior vena cava. No gross pneumothorax.  Confluence of shadows right upper lung relatively similar to the prior exam.  Attention to this on follow-up two-view chest.  Cardiomegaly.  Crowding of vessels/atelectatic changes medial aspect lung bases.  Central pulmonary vascular prominence.  IMPRESSION: Left central line placed with the tip at the level of the proximal superior vena cava. No gross pneumothorax.   Please see above discussion.  This is a call report.   Original Report Authenticated By: Fuller Canada, M.D.    Dg Fluoro Guide Cv Line-no Report  07/21/2012  CLINICAL DATA: port placement   FLOURO GUIDE CV LINE  Fluoroscopy was utilized by the requesting physician.  No radiographic  interpretation.        IMPRESSION: Inflammatory breast cancer  PLAN: I spoke with Jessica Wells regarding her diagnosis and options for treatment. We discussed the role of radiation and decreasing local failures in patients who have inflammatory breast cancer. We discussed that local treatment is standardly a mastectomy. We discussed treatment of her come nodes. We discussed the process of simulation the placement tattoos. We discussed skin redness and fatigue as possible side effects. She does have an appointment to see genetic counselor which is scheduled for October. I discussed with her that the greatest risk for her was failures outside the breast and therefore a left-sided mastectomy with the information recurrently had would not be helpful in terms of survival. Obviously if she is found have an inherited genetic mutation, we will deal this accordingly and may recommend a left-sided mastectomy. At this point however there is plenty of time to choose as she is still undergoing chemotherapy. I told her we would start radiation about a month after surgery. We discussed 33 treatments as an outpatient.  I spent 60 minutes  face to face with the patient and more than 50% of that time was spent in counseling and/or coordination of care.   ------------------------------------------------  Lurline Hare, MD

## 2012-08-06 ENCOUNTER — Encounter: Payer: Self-pay | Admitting: *Deleted

## 2012-08-06 ENCOUNTER — Telehealth: Payer: Self-pay | Admitting: *Deleted

## 2012-08-06 ENCOUNTER — Ambulatory Visit (HOSPITAL_BASED_OUTPATIENT_CLINIC_OR_DEPARTMENT_OTHER): Payer: Medicare Other | Admitting: Oncology

## 2012-08-06 ENCOUNTER — Other Ambulatory Visit (HOSPITAL_BASED_OUTPATIENT_CLINIC_OR_DEPARTMENT_OTHER): Payer: Medicare Other | Admitting: Lab

## 2012-08-06 ENCOUNTER — Ambulatory Visit (HOSPITAL_BASED_OUTPATIENT_CLINIC_OR_DEPARTMENT_OTHER): Payer: Medicare Other

## 2012-08-06 ENCOUNTER — Encounter: Payer: Self-pay | Admitting: Oncology

## 2012-08-06 VITALS — BP 147/82 | HR 91 | Temp 98.3°F | Resp 18

## 2012-08-06 DIAGNOSIS — C50119 Malignant neoplasm of central portion of unspecified female breast: Secondary | ICD-10-CM

## 2012-08-06 DIAGNOSIS — Z5111 Encounter for antineoplastic chemotherapy: Secondary | ICD-10-CM | POA: Diagnosis not present

## 2012-08-06 DIAGNOSIS — C50919 Malignant neoplasm of unspecified site of unspecified female breast: Secondary | ICD-10-CM

## 2012-08-06 DIAGNOSIS — Z171 Estrogen receptor negative status [ER-]: Secondary | ICD-10-CM

## 2012-08-06 DIAGNOSIS — C773 Secondary and unspecified malignant neoplasm of axilla and upper limb lymph nodes: Secondary | ICD-10-CM

## 2012-08-06 LAB — COMPREHENSIVE METABOLIC PANEL (CC13)
ALT: 22 U/L (ref 0–55)
BUN: 12 mg/dL (ref 7.0–26.0)
CO2: 26 mEq/L (ref 22–29)
Calcium: 9.4 mg/dL (ref 8.4–10.4)
Chloride: 104 mEq/L (ref 98–107)
Creatinine: 0.7 mg/dL (ref 0.6–1.1)
Glucose: 113 mg/dl — ABNORMAL HIGH (ref 70–99)
Total Bilirubin: 0.2 mg/dL (ref 0.20–1.20)

## 2012-08-06 LAB — CBC WITH DIFFERENTIAL/PLATELET
Basophils Absolute: 0 10*3/uL (ref 0.0–0.1)
EOS%: 0.2 % (ref 0.0–7.0)
Eosinophils Absolute: 0 10*3/uL (ref 0.0–0.5)
HGB: 11.8 g/dL (ref 11.6–15.9)
NEUT#: 6.7 10*3/uL — ABNORMAL HIGH (ref 1.5–6.5)
RBC: 4.03 10*6/uL (ref 3.70–5.45)
RDW: 12.9 % (ref 11.2–14.5)
lymph#: 2 10*3/uL (ref 0.9–3.3)
nRBC: 1 % — ABNORMAL HIGH (ref 0–0)

## 2012-08-06 MED ORDER — DEXAMETHASONE SODIUM PHOSPHATE 4 MG/ML IJ SOLN
12.0000 mg | Freq: Once | INTRAMUSCULAR | Status: AC
  Administered 2012-08-06: 12 mg via INTRAVENOUS

## 2012-08-06 MED ORDER — SODIUM CHLORIDE 0.9 % IJ SOLN
10.0000 mL | INTRAMUSCULAR | Status: DC | PRN
  Filled 2012-08-06: qty 10

## 2012-08-06 MED ORDER — SODIUM CHLORIDE 0.9 % IV SOLN
500.0000 mg/m2 | Freq: Once | INTRAVENOUS | Status: AC
  Administered 2012-08-06: 820 mg via INTRAVENOUS
  Filled 2012-08-06: qty 41

## 2012-08-06 MED ORDER — FLUOROURACIL CHEMO INJECTION 2.5 GM/50ML
500.0000 mg/m2 | Freq: Once | INTRAVENOUS | Status: AC
  Administered 2012-08-06: 850 mg via INTRAVENOUS
  Filled 2012-08-06: qty 17

## 2012-08-06 MED ORDER — PALONOSETRON HCL INJECTION 0.25 MG/5ML
0.2500 mg | Freq: Once | INTRAVENOUS | Status: AC
  Administered 2012-08-06: 0.25 mg via INTRAVENOUS

## 2012-08-06 MED ORDER — SODIUM CHLORIDE 0.9 % IV SOLN
150.0000 mg | Freq: Once | INTRAVENOUS | Status: AC
  Administered 2012-08-06: 150 mg via INTRAVENOUS
  Filled 2012-08-06: qty 5

## 2012-08-06 MED ORDER — HEPARIN SOD (PORK) LOCK FLUSH 100 UNIT/ML IV SOLN
500.0000 [IU] | Freq: Once | INTRAVENOUS | Status: DC | PRN
  Filled 2012-08-06: qty 5

## 2012-08-06 MED ORDER — EPIRUBICIN HCL CHEMO IV INJECTION 200 MG/100ML
100.0000 mg/m2 | Freq: Once | INTRAVENOUS | Status: AC
  Administered 2012-08-06: 166 mg via INTRAVENOUS
  Filled 2012-08-06: qty 83

## 2012-08-06 MED ORDER — SODIUM CHLORIDE 0.9 % IV SOLN
Freq: Once | INTRAVENOUS | Status: AC
  Administered 2012-08-06: 10:00:00 via INTRAVENOUS

## 2012-08-06 NOTE — Patient Instructions (Addendum)
Proceed with chemotherapy today  I will see you in 1 week

## 2012-08-06 NOTE — Patient Instructions (Addendum)
[facility] Discharge Instructions for Patients Receiving Chemotherapy  Today you received the following chemotherapy agents ; Flouracil, Ellence, Cytoxan.  To help prevent nausea and vomiting after your treatment, we encourage you to take your nausea medication;  Zofran (ondansetron),  Compazine (prochloraperazine), and Ativan (lorazepam) as directed.    If you develop nausea and vomiting that is not controlled by your nausea medication, call the clinic. If it is after clinic hours your family physician or the after hours number for the clinic or go to the Emergency Department.   BELOW ARE SYMPTOMS THAT SHOULD BE REPORTED IMMEDIATELY:  *FEVER GREATER THAN 100.5 F  *CHILLS WITH OR WITHOUT FEVER  NAUSEA AND VOMITING THAT IS NOT CONTROLLED WITH YOUR NAUSEA MEDICATION  *UNUSUAL SHORTNESS OF BREATH  *UNUSUAL BRUISING OR BLEEDING  TENDERNESS IN MOUTH AND THROAT WITH OR WITHOUT PRESENCE OF ULCERS  *URINARY PROBLEMS  *BOWEL PROBLEMS  UNUSUAL RASH Items with * indicate a potential emergency and should be followed up as soon as possible.   Feel free to call the clinic you have any questions or concerns. The clinic phone number is [phone].   I have been informed and understand all the instructions given to me. I know to contact the clinic, my physician, or go to the Emergency Department if any problems should occur. I do not have any questions at this time, but understand that I may call the clinic during office hours   should I have any questions or need assistance in obtaining follow up care.    __________________________________________  _____________  __________ Signature of Patient or Authorized Representative            Date                   Time    __________________________________________ Nurse's Signature

## 2012-08-06 NOTE — Telephone Encounter (Signed)
md is on 09-10-2012 pal that day

## 2012-08-06 NOTE — Telephone Encounter (Signed)
Add on md appointments on 08-06-2012 emailed michelle to adjust the patient treatment add on md appointments

## 2012-08-06 NOTE — Progress Notes (Signed)
OFFICE PROGRESS NOTE  CC  Jessica Alken, MD [address] [place] [address]  DIAGNOSIS: 66 year old female with new diagnosis of inflammatory breast cancer  PRIOR THERAPY: #1 patient was originally seen in my clinic for new diagnosis of inflammatory breast cancer she was referred by Dr. Emelia Loron. She had a mammogram performed That showed an abnormality. That was biopsied and it showed an invasive mammary carcinoma with lymphovascular invasion grade 3 ER negative PR negative HER-2/neu positive.  #2 patient has gone on to have MRI of the breasts performed the MRI shows diffuse right breast neoplasm on a large level I right axillary lymph nodes compatible with lymphatic spread.Patient has had a biopsy of the right axillary lymph node that is compatible with invasive mammary carcinoma.  #3 patient  began FEC 100 with day 2 Neulasta on 07/23/2012  CURRENT THERAPY:  cycle 2 day 1 of FEC on 07/23/2012  INTERVAL HISTORY: Jessica Wells 66 y.o. female returns for Followup visit . She did very well with cycle 1. Today she is here for cycle 2 of FEC. She overall feels well she is beginning to lose her hair. She otherwise denies any nausea vomiting fevers chills night sweats headaches no shortness of breath no chest pains or palpitations no myalgias or arthralgias no bleeding problems. Her breast does look better. Remainder of the 10 Point review of systems is negative. MEDICAL HISTORY: Past Medical History  Diagnosis Date  . Asthma   . GERD (gastroesophageal reflux disease)   . PONV (postoperative nausea and vomiting)     severe  . Dysrhythmia     PAT-sees dr Laurence Compton meds  . Breast cancer     inflammatory right breast ca  . Allergy   . PAT (paroxysmal atrial tachycardia)     hx  . Arthritis     osteopenia,knees    ALLERGIES:  is allergic to anesthetics, amide; bactrim; sulfa antibiotics; codeine; codeine phosphate; and eggs or egg-derived  products.  MEDICATIONS:  Current Outpatient Prescriptions  Medication Sig Dispense Refill  . albuterol (PROVENTIL HFA;VENTOLIN HFA) 108 (90 BASE) MCG/ACT inhaler Inhale 2 puffs into the lungs every 6 (six) hours as needed.      Marland Kitchen aspirin EC 81 MG tablet Take 162 mg by mouth every 6 (six) hours as needed. For blood clot symptoms      . calcium-vitamin D (OSCAL WITH D) 500-200 MG-UNIT per tablet Take 1 tablet by mouth daily.      Marland Kitchen dexamethasone (DECADRON) 4 MG tablet Take 2 tablets by mouth once a day on the day after chemotherapy and then take 2 tablets two times a day for 2 days. Take with food.  30 tablet  1  . fish oil-omega-3 fatty acids 1000 MG capsule Take 2 g by mouth daily.      . Flaxseed, Linseed, (FLAX SEEDS PO) Take 1 tablet by mouth daily.      . fluticasone (FLONASE) 50 MCG/ACT nasal spray Place 1 spray into the nose daily.      Marland Kitchen glucosamine-chondroitin 500-400 MG tablet Take 1 tablet by mouth daily.      Marland Kitchen HYDROcodone-acetaminophen (NORCO/VICODIN) 5-325 MG per tablet       . lidocaine-prilocaine (EMLA) cream Apply topically as needed.  30 g  5  . LORazepam (ATIVAN) 0.5 MG tablet Take 1 tablet (0.5 mg total) by mouth every 6 (six) hours as needed (Nausea or vomiting).  30 tablet  0  . Multiple Vitamin (MULTIVITAMIN WITH MINERALS) TABS Take 1  tablet by mouth daily.      Marland Kitchen omeprazole (PRILOSEC) 20 MG capsule Take 20 mg by mouth daily.      . ondansetron (ZOFRAN) 8 MG tablet Take 1 tablet two times a day as needed for nausea or vomiting starting on the third day after chemotherapy.  30 tablet  1  . ondansetron (ZOFRAN-ODT) 8 MG disintegrating tablet       . oxyCODONE-acetaminophen (PERCOCET/ROXICET) 5-325 MG per tablet       . prochlorperazine (COMPAZINE) 10 MG tablet Take 1 tablet (10 mg total) by mouth every 6 (six) hours as needed (Nausea or vomiting).  30 tablet  1  . prochlorperazine (COMPAZINE) 25 MG suppository Place 1 suppository (25 mg total) rectally every 12 (twelve)  hours as needed for nausea.  12 suppository  3   No current facility-administered medications for this visit.   Facility-Administered Medications Ordered in Other Visits  Medication Dose Route Frequency Provider Last Rate Last Dose  . 0.9 %  sodium chloride infusion   Intravenous Once Victorino December, MD      . heparin lock flush 100 unit/mL  500 Units Intracatheter Once PRN Victorino December, MD      . sodium chloride 0.9 % injection 10 mL  10 mL Intracatheter PRN Victorino December, MD        SURGICAL HISTORY:  Past Surgical History  Procedure Date  . Dilation and curettage of uterus     x3  . Tonsillectomy   . Colonoscopy   . Portacath placement 07/21/2012    Procedure: INSERTION PORT-A-CATH;  Surgeon: Emelia Loron, MD;  Location: [facility];  Service: General;  Laterality: Left;    REVIEW OF SYSTEMS:  Pertinent items are noted in HPI.   PHYSICAL EXAMINATION: General appearance: alert and cooperative Lymph nodes: noted to have a palpable right axillary lymph node measuring about 2 cm. No supraclavicular adenopathy cervical lymphadenopathy inguinal or left axillary lymphadenopathy. Resp: clear to auscultation bilaterally Back: symmetric, no curvature. ROM normal. No CVA tenderness. Cardio: regular rate and rhythm, S1, S2 normal, no murmur, click, rub or gallop GI: soft, non-tender; bowel sounds normal; no masses,  no organomegaly Extremities: extremities normal, atraumatic, no cyanosis or edema Neurologic: Grossly normal Right breast reveals grossly enlarged breast with Dimpling of the skin And retraction of the nipple.Left breast no masses or nipple discharge or skin changes.  ECOG PERFORMANCE STATUS: 0 - Asymptomatic  There were no vitals taken for this visit.  LABORATORY DATA: Lab Results  Component Value Date   WBC 9.5 08/06/2012   HGB 11.8 08/06/2012   HCT 34.2* 08/06/2012   MCV 84.9 08/06/2012   PLT 170 08/06/2012      Chemistry      Component Value  Date/Time   NA 138 07/28/2012 1334   NA 140 07/19/2012 1538   K 3.7 07/28/2012 1334   K 3.7 07/19/2012 1538   CL 105 07/28/2012 1334   CL 103 07/19/2012 1538   CO2 27 07/28/2012 1334   CO2 27 07/19/2012 1538   BUN 19.0 07/28/2012 1334   BUN 15 07/19/2012 1538   CREATININE 0.8 07/28/2012 1334   CREATININE 0.77 07/19/2012 1538      Component Value Date/Time   CALCIUM 8.4 07/28/2012 1334   CALCIUM 9.3 07/19/2012 1538   ALKPHOS 60 07/19/2012 1538   AST 17 07/19/2012 1538   ALT 16 07/19/2012 1538   BILITOT 0.2* 07/19/2012 1538       RADIOGRAPHIC  STUDIES:  Mr Breast Bilateral W Wo Contrast  07/19/2012  *RADIOLOGY REPORT*  Clinical Data: 66 year old female with newly diagnosed right invasive mammary carcinoma and mammary carcinoma in situ with lymphovascular invasion.  BILATERAL BREAST MRI WITH AND WITHOUT CONTRAST  Technique: Multiplanar, multisequence MR images of both breasts were obtained prior to and following the intravenous administration of 14ml of Multihance .  Three dimensional images were evaluated at the independent DynaCad workstation.  Comparison:  06/25/2012 and prior mammograms from [place] dating back to 08/07/2010.  05/25/2012 and 07/12/2012 ultrasounds.  Findings: Mild normal background parenchymal enhancement is identified within the left breast.  The right breast is much smaller than the left.  Abnormal irregular enhancement throughout the majority of the right breast is noted with skin thickening, compatible with biopsy-proven neoplasm and lymphovascular/dermal invasion.  Biopsy clip artifact within the upper central right breast is identified.  Enlarged level I right axillary lymph nodes are present. There are no abnormal appearing or enlarged lymph nodes within the internal mammary or left axillary regions.  No abnormal areas of enhancement are identified within the left breast.  Mild non mass-like enhancement within the pre sternal fat is present.  This is nonspecific and could represent  mild inflammation but neoplasm is difficult to exclude.  The visualized upper liver and bony structures are within normal limits.  IMPRESSION: Diffuse right breast neoplasm and enlarged level I right axillary lymph nodes compatible with lymphatic spread.  Mild non mass-like enhancement within the presternal fat - second look ultrasound / sampling would be helpful to exclude neoplasm.  No evidence of LEFT breast malignancy.  BI-RADS CATEGORY 6:  Known biopsy-proven malignancy - appropriate action should be taken.  RECOMMENDATION: Consider second look ultrasound / sampling of the mild non mass- like enhancement in the pre sternal fat, if neoplasm in this region would affect treatment.  THREE-DIMENSIONAL MR IMAGE RENDERING ON INDEPENDENT WORKSTATION:  Three-dimensional MR images were rendered by post-processing of the original MR data on an independent workstation.  The three- dimensional MR images were interpreted, and findings were reported in the accompanying complete MRI report for this study.   Original Report Authenticated By: Rosendo Gros, M.D.    Dg Chest Port 1 View  07/21/2012  *RADIOLOGY REPORT*  Clinical Data: Central line placement.  Rule out pneumothorax.  PORTABLE CHEST - 1 VIEW  Comparison: 02/24/2011.  Findings: Left central line placed with the tip at the level of the proximal superior vena cava. No gross pneumothorax.  Confluence of shadows right upper lung relatively similar to the prior exam.  Attention to this on follow-up two-view chest.  Cardiomegaly.  Crowding of vessels/atelectatic changes medial aspect lung bases.  Central pulmonary vascular prominence.  IMPRESSION: Left central line placed with the tip at the level of the proximal superior vena cava. No gross pneumothorax.  Please see above discussion.  This is a call report.   Original Report Authenticated By: Fuller Canada, M.D.    Dg Fluoro Guide Cv Line-no Report  07/21/2012  CLINICAL DATA: port placement   FLOURO GUIDE CV LINE   Fluoroscopy was utilized by the requesting physician.  No radiographic  interpretation.      ASSESSMENT: 66 year old female with  #1 inflammatory right breast cancer with positive lymph nodes the tumor is ER negative PR negative HER-2/neu positive. Patient is  to receive neoadjuvant chemotherapy intially Consisting of FEC q 2 weeks x 4 cycles. Then she will receive taxol and herceptin q week x 12 weeks.Once patient  completes her chemotherapy then we will get restaging MRIs performed to evaluate response to therapy. Patient does understand that she will most likely need a mastectomy because she does have inflammatory breast cancer.  #2 risks and benefits of chemotherapy were clearly explained to the patient.     PLAN:  #1 Cycle 2 of chemotherapy.Turn tomorrow for Neulasta injection  2 she will return in one week's time for interim labs. She knows to call with any problems.   All questions were answered. The patient knows to call the clinic with any problems, questions or concerns. We can certainly see the patient much sooner if necessary.  I spent 25 minutes counseling the patient face to face. The total time spent in the appointment was 30 minutes.    Drue Second, MD Medical/Oncology [facility] [phone] (beeper) [phone] (Office)

## 2012-08-07 ENCOUNTER — Ambulatory Visit (HOSPITAL_BASED_OUTPATIENT_CLINIC_OR_DEPARTMENT_OTHER): Payer: Medicare Other

## 2012-08-07 DIAGNOSIS — C773 Secondary and unspecified malignant neoplasm of axilla and upper limb lymph nodes: Secondary | ICD-10-CM

## 2012-08-07 DIAGNOSIS — C50919 Malignant neoplasm of unspecified site of unspecified female breast: Secondary | ICD-10-CM

## 2012-08-07 DIAGNOSIS — C50119 Malignant neoplasm of central portion of unspecified female breast: Secondary | ICD-10-CM

## 2012-08-07 MED ORDER — PEGFILGRASTIM INJECTION 6 MG/0.6ML
6.0000 mg | Freq: Once | SUBCUTANEOUS | Status: AC
  Administered 2012-08-07: 6 mg via SUBCUTANEOUS

## 2012-08-09 ENCOUNTER — Other Ambulatory Visit: Payer: Self-pay | Admitting: Certified Registered Nurse Anesthetist

## 2012-08-13 ENCOUNTER — Encounter: Payer: Self-pay | Admitting: Oncology

## 2012-08-13 ENCOUNTER — Telehealth: Payer: Self-pay | Admitting: *Deleted

## 2012-08-13 ENCOUNTER — Ambulatory Visit (HOSPITAL_BASED_OUTPATIENT_CLINIC_OR_DEPARTMENT_OTHER): Payer: Medicare Other | Admitting: Oncology

## 2012-08-13 ENCOUNTER — Other Ambulatory Visit (HOSPITAL_BASED_OUTPATIENT_CLINIC_OR_DEPARTMENT_OTHER): Payer: Medicare Other | Admitting: Lab

## 2012-08-13 ENCOUNTER — Other Ambulatory Visit: Payer: Medicare Other

## 2012-08-13 VITALS — BP 127/82 | HR 105 | Temp 99.1°F | Resp 20 | Ht 61.0 in | Wt 137.1 lb

## 2012-08-13 DIAGNOSIS — B009 Herpesviral infection, unspecified: Secondary | ICD-10-CM

## 2012-08-13 DIAGNOSIS — C50919 Malignant neoplasm of unspecified site of unspecified female breast: Secondary | ICD-10-CM

## 2012-08-13 DIAGNOSIS — C50119 Malignant neoplasm of central portion of unspecified female breast: Secondary | ICD-10-CM

## 2012-08-13 DIAGNOSIS — Z171 Estrogen receptor negative status [ER-]: Secondary | ICD-10-CM

## 2012-08-13 DIAGNOSIS — D709 Neutropenia, unspecified: Secondary | ICD-10-CM

## 2012-08-13 LAB — CBC WITH DIFFERENTIAL/PLATELET
Eosinophils Absolute: 0 10*3/uL (ref 0.0–0.5)
MCV: 86.8 fL (ref 79.5–101.0)
MONO%: 5.6 % (ref 0.0–14.0)
NEUT#: 0.2 10*3/uL — CL (ref 1.5–6.5)
RBC: 3.8 10*6/uL (ref 3.70–5.45)
RDW: 13.4 % (ref 11.2–14.5)
WBC: 0.7 10*3/uL — CL (ref 3.9–10.3)

## 2012-08-13 LAB — COMPREHENSIVE METABOLIC PANEL (CC13)
AST: 9 U/L (ref 5–34)
Albumin: 3.4 g/dL — ABNORMAL LOW (ref 3.5–5.0)
Alkaline Phosphatase: 94 U/L (ref 40–150)
Glucose: 127 mg/dl — ABNORMAL HIGH (ref 70–99)
Potassium: 3.8 mEq/L (ref 3.5–5.1)
Sodium: 138 mEq/L (ref 136–145)
Total Protein: 5.8 g/dL — ABNORMAL LOW (ref 6.4–8.3)

## 2012-08-13 MED ORDER — CIPROFLOXACIN HCL 500 MG PO TABS: 500.0000 mg | ORAL_TABLET | Freq: Two times a day (BID) | ORAL | Status: AC

## 2012-08-13 MED ORDER — VALACYCLOVIR HCL 500 MG PO TABS: 500.0000 mg | ORAL_TABLET | Freq: Three times a day (TID) | ORAL | Status: DC

## 2012-08-13 NOTE — Telephone Encounter (Signed)
Prior authorization received for valtrex 500 mg. Request to Managed Care.

## 2012-08-13 NOTE — Telephone Encounter (Signed)
As of 08-13-2012 nothing need to be scheduled

## 2012-08-13 NOTE — Progress Notes (Signed)
OFFICE PROGRESS NOTE  CC  Jessica Alken, MD [address] [place] [address]  DIAGNOSIS: 66 year old female with new diagnosis of inflammatory breast cancer  PRIOR THERAPY: #1 patient was originally seen in my clinic for new diagnosis of inflammatory breast cancer she was referred by Dr. Emelia Loron. She had a mammogram performed That showed an abnormality. That was biopsied and it showed an invasive mammary carcinoma with lymphovascular invasion grade 3 ER negative PR negative HER-2/neu positive.  #2 patient has gone on to have MRI of the breasts performed the MRI shows diffuse right breast neoplasm on a large level I right axillary lymph nodes compatible with lymphatic spread.Patient has had a biopsy of the right axillary lymph node that is compatible with invasive mammary carcinoma.  #3 patient  began FEC 100 with day 2 Neulasta on 07/23/2012  CURRENT THERAPY: s/p cycle 2 day 1 of FEC on 07/23/2012  INTERVAL HISTORY: Jessica Wells 67 y.o. female returns for Followup visit .overall she is doing well. She completed cycle 2 last week. She is having weakness and fatigue. She is denying any nausea. She also tells me that she's had a herpes outbreak as well and it is significantly worse than it had been previously. Therefore I have recommended Valtrex. Patient is also neutropenic but she has not had any fevers chills or night sweats. She has no nausea or vomiting no chills. No bleeding problems. Remainder of the 10 point review of systems is negative.  MEDICAL HISTORY: Past Medical History  Diagnosis Date  . Asthma   . GERD (gastroesophageal reflux disease)   . PONV (postoperative nausea and vomiting)     severe  . Dysrhythmia     PAT-sees dr Laurence Compton meds  . Breast cancer     inflammatory right breast ca  . Allergy   . PAT (paroxysmal atrial tachycardia)     hx  . Arthritis     osteopenia,knees    ALLERGIES:  is allergic to anesthetics, amide; bactrim;  sulfa antibiotics; codeine; codeine phosphate; and eggs or egg-derived products.  MEDICATIONS:  Current Outpatient Prescriptions  Medication Sig Dispense Refill  . albuterol (PROVENTIL HFA;VENTOLIN HFA) 108 (90 BASE) MCG/ACT inhaler Inhale 2 puffs into the lungs every 6 (six) hours as needed.      Marland Kitchen aspirin EC 81 MG tablet Take 162 mg by mouth every 6 (six) hours as needed. For blood clot symptoms      . dexamethasone (DECADRON) 4 MG tablet Take 2 tablets by mouth once a day on the day after chemotherapy and then take 2 tablets two times a day for 2 days. Take with food.  30 tablet  1  . fish oil-omega-3 fatty acids 1000 MG capsule Take 2 g by mouth daily.      . Flaxseed, Linseed, (FLAX SEEDS PO) Take 1 tablet by mouth daily.      . fluticasone (FLONASE) 50 MCG/ACT nasal spray Place 1 spray into the nose daily.      Marland Kitchen glucosamine-chondroitin 500-400 MG tablet Take 1 tablet by mouth daily.      Marland Kitchen HYDROcodone-acetaminophen (NORCO/VICODIN) 5-325 MG per tablet       . lidocaine-prilocaine (EMLA) cream Apply topically as needed.  30 g  5  . LORazepam (ATIVAN) 0.5 MG tablet Take 1 tablet (0.5 mg total) by mouth every 6 (six) hours as needed (Nausea or vomiting).  30 tablet  0  . Multiple Vitamin (MULTIVITAMIN WITH MINERALS) TABS Take 1 tablet by mouth daily.      Marland Kitchen  omeprazole (PRILOSEC) 20 MG capsule Take 20 mg by mouth daily.      . ondansetron (ZOFRAN) 8 MG tablet Take 1 tablet two times a day as needed for nausea or vomiting starting on the third day after chemotherapy.  30 tablet  1  . ondansetron (ZOFRAN-ODT) 8 MG disintegrating tablet       . oxyCODONE-acetaminophen (PERCOCET/ROXICET) 5-325 MG per tablet       . prochlorperazine (COMPAZINE) 10 MG tablet Take 1 tablet (10 mg total) by mouth every 6 (six) hours as needed (Nausea or vomiting).  30 tablet  1  . prochlorperazine (COMPAZINE) 25 MG suppository Place 1 suppository (25 mg total) rectally every 12 (twelve) hours as needed for nausea.  12  suppository  3  . calcium-vitamin D (OSCAL WITH D) 500-200 MG-UNIT per tablet Take 1 tablet by mouth daily.      . ciprofloxacin (CIPRO) 500 MG tablet Take 1 tablet (500 mg total) by mouth 2 (two) times daily.  14 tablet  5  . valACYclovir (VALTREX) 500 MG tablet Take 1 tablet (500 mg total) by mouth 3 (three) times daily.  90 tablet  6    SURGICAL HISTORY:  Past Surgical History  Procedure Date  . Dilation and curettage of uterus     x3  . Tonsillectomy   . Colonoscopy   . Portacath placement 07/21/2012    Procedure: INSERTION PORT-A-CATH;  Surgeon: Emelia Loron, MD;  Location: [facility];  Service: General;  Laterality: Left;    REVIEW OF SYSTEMS:  Pertinent items are noted in HPI.   PHYSICAL EXAMINATION: General appearance: alert and cooperative Lymph nodes: noted to have a palpable right axillary lymph node measuring about 2 cm. No supraclavicular adenopathy cervical lymphadenopathy inguinal or left axillary lymphadenopathy. Resp: clear to auscultation bilaterally Back: symmetric, no curvature. ROM normal. No CVA tenderness. Cardio: regular rate and rhythm, S1, S2 normal, no murmur, click, rub or gallop GI: soft, non-tender; bowel sounds normal; no masses,  no organomegaly Extremities: extremities normal, atraumatic, no cyanosis or edema Neurologic: Grossly normal Right breast reveals grossly enlarged breast with Dimpling of the skin And retraction of the nipple.Left breast no masses or nipple discharge or skin changes.  ECOG PERFORMANCE STATUS: 0 - Asymptomatic  Blood pressure 127/82, pulse 105, temperature 99.1 F (37.3 C), temperature source Oral, resp. rate 20, height 5\' 1"  (1.549 m), weight 137 lb 1.6 oz (62.188 kg).  LABORATORY DATA: Lab Results  Component Value Date   WBC 0.7* 08/13/2012   HGB 11.6 08/13/2012   HCT 33.0* 08/13/2012   MCV 86.8 08/13/2012   PLT 72* 08/13/2012      Chemistry      Component Value Date/Time   NA 138 08/13/2012 0924    NA 140 07/19/2012 1538   K 3.8 08/13/2012 0924   K 3.7 07/19/2012 1538   CL 102 08/13/2012 0924   CL 103 07/19/2012 1538   CO2 27 08/13/2012 0924   CO2 27 07/19/2012 1538   BUN 15.0 08/13/2012 0924   BUN 15 07/19/2012 1538   CREATININE 0.7 08/13/2012 0924   CREATININE 0.77 07/19/2012 1538      Component Value Date/Time   CALCIUM 9.1 08/13/2012 0924   CALCIUM 9.3 07/19/2012 1538   ALKPHOS 94 08/13/2012 0924   ALKPHOS 60 07/19/2012 1538   AST 9 08/13/2012 0924   AST 17 07/19/2012 1538   ALT 14 08/13/2012 0924   ALT 16 07/19/2012 1538   BILITOT 0.70 08/13/2012 8119  BILITOT 0.2* 07/19/2012 1538       RADIOGRAPHIC STUDIES:  ASSESSMENT: 66 year old female with  #1 inflammatory right breast cancer with positive lymph nodes the tumor is ER negative PR negative HER-2/neu positive. Patient is  to receive neoadjuvant chemotherapy intially Consisting of FEC q 2 weeks x 4 cycles. Then she will receive taxol and herceptin q week x 12 weeks.Once patient completes her chemotherapy then we will get restaging MRIs performed to evaluate response to therapy. Patient does understand that she will most likely need a mastectomy because she does have inflammatory breast cancer.       PLAN:  #1 patient is neutropenic neutropenic precautions were discussed with her. I will go ahead and get her started on Cipro 500 mg twice a day. Patient also has had a herpetic outbreak and I will start her on Valtrex.  #2 patient will return in one week's time for followup   All questions were answered. The patient knows to call the clinic with any problems, questions or concerns. We can certainly see the patient much sooner if necessary.  I spent 25 minutes counseling the patient face to face. The total time spent in the appointment was 30 minutes.    Drue Second, MD Medical/Oncology [facility] [phone] (beeper) [phone] (Office)

## 2012-08-13 NOTE — Progress Notes (Signed)
[facility] [phone] about valtrex 500mg  90 tabs prior auth, should receive response within 24-72 hours.

## 2012-08-13 NOTE — Patient Instructions (Addendum)
Doing well   You are neutropenic so see the instructions below  Begin cipro 500 mg tice a day  Valtrex as directed.   Patient Neutropenia Instruction Sheet  Diagnosis: Breast Cancer      Treating Physician: Drue Second, MD  Treatment: 1. Type of chemotherapy:  FEC every 14 days 2. Date of last treatment: 08/06/12  Last Blood Counts: Lab Results  Component Value Date   WBC 0.7* 08/13/2012   HGB 11.6 08/13/2012   HCT 33.0* 08/13/2012   MCV 86.8 08/13/2012   PLT 72* 08/13/2012        Prophylactic Antibiotics: Cipro 500 mg by mouth twice a day Instructions: 1. Monitor temperature and call if fever  greater than 100.5, chills, shaking chills (rigors) 2. Call Physician on-call at [phone] 3. Give him/her symptoms and list of medications that you are taking and your last blood count.

## 2012-08-16 ENCOUNTER — Encounter (INDEPENDENT_AMBULATORY_CARE_PROVIDER_SITE_OTHER): Payer: Self-pay

## 2012-08-17 ENCOUNTER — Encounter: Payer: Self-pay | Admitting: Oncology

## 2012-08-17 ENCOUNTER — Telehealth: Payer: Self-pay | Admitting: *Deleted

## 2012-08-17 NOTE — Progress Notes (Signed)
Per [place], [id number] opt 3,1, they denied voltrex 500mg  90 tabs tid; she can get 60 tabs bid.

## 2012-08-17 NOTE — Telephone Encounter (Signed)
Returned pt's call regarding Valtrex authorization. Note from [place], managed care ; note dated on 9/13, awaiting prior authorization. LMOVM for pt with this update

## 2012-08-18 ENCOUNTER — Encounter (INDEPENDENT_AMBULATORY_CARE_PROVIDER_SITE_OTHER): Payer: Self-pay

## 2012-08-18 ENCOUNTER — Telehealth: Payer: Self-pay | Admitting: *Deleted

## 2012-08-18 NOTE — Telephone Encounter (Signed)
Pt called advised she has rx problem resolved.

## 2012-08-18 NOTE — Telephone Encounter (Signed)
Call from [place] in Managed care, insurance has approved Rx valtrex Q# 60 BID. Atempted to reach pt. LMOVM to call back regarding medication changes.

## 2012-08-20 ENCOUNTER — Telehealth: Payer: Self-pay | Admitting: *Deleted

## 2012-08-20 ENCOUNTER — Other Ambulatory Visit (HOSPITAL_BASED_OUTPATIENT_CLINIC_OR_DEPARTMENT_OTHER): Payer: Medicare Other | Admitting: Lab

## 2012-08-20 ENCOUNTER — Ambulatory Visit (HOSPITAL_BASED_OUTPATIENT_CLINIC_OR_DEPARTMENT_OTHER): Payer: Medicare Other | Admitting: Oncology

## 2012-08-20 ENCOUNTER — Ambulatory Visit (HOSPITAL_BASED_OUTPATIENT_CLINIC_OR_DEPARTMENT_OTHER): Payer: Medicare Other

## 2012-08-20 ENCOUNTER — Other Ambulatory Visit: Payer: Medicare Other | Admitting: Lab

## 2012-08-20 ENCOUNTER — Encounter: Payer: Self-pay | Admitting: Oncology

## 2012-08-20 VITALS — BP 149/82 | HR 114 | Temp 98.7°F | Resp 20 | Ht 61.0 in | Wt 135.6 lb

## 2012-08-20 DIAGNOSIS — C50919 Malignant neoplasm of unspecified site of unspecified female breast: Secondary | ICD-10-CM

## 2012-08-20 DIAGNOSIS — R5381 Other malaise: Secondary | ICD-10-CM | POA: Diagnosis not present

## 2012-08-20 DIAGNOSIS — C50119 Malignant neoplasm of central portion of unspecified female breast: Secondary | ICD-10-CM

## 2012-08-20 DIAGNOSIS — R5383 Other fatigue: Secondary | ICD-10-CM | POA: Diagnosis not present

## 2012-08-20 DIAGNOSIS — Z170 Estrogen receptor positive status [ER+]: Secondary | ICD-10-CM | POA: Diagnosis not present

## 2012-08-20 DIAGNOSIS — C773 Secondary and unspecified malignant neoplasm of axilla and upper limb lymph nodes: Secondary | ICD-10-CM

## 2012-08-20 DIAGNOSIS — Z452 Encounter for adjustment and management of vascular access device: Secondary | ICD-10-CM | POA: Diagnosis not present

## 2012-08-20 DIAGNOSIS — Z5111 Encounter for antineoplastic chemotherapy: Secondary | ICD-10-CM | POA: Diagnosis not present

## 2012-08-20 LAB — COMPREHENSIVE METABOLIC PANEL (CC13)
ALT: 24 U/L (ref 0–55)
BUN: 13 mg/dL (ref 7.0–26.0)
CO2: 25 mEq/L (ref 22–29)
Calcium: 9.7 mg/dL (ref 8.4–10.4)
Chloride: 103 mEq/L (ref 98–107)
Creatinine: 0.9 mg/dL (ref 0.6–1.1)
Glucose: 155 mg/dl — ABNORMAL HIGH (ref 70–99)
Total Bilirubin: 0.3 mg/dL (ref 0.20–1.20)

## 2012-08-20 LAB — CBC WITH DIFFERENTIAL/PLATELET
BASO%: 0.5 % (ref 0.0–2.0)
Basophils Absolute: 0 10*3/uL (ref 0.0–0.1)
HCT: 33.7 % — ABNORMAL LOW (ref 34.8–46.6)
HGB: 11.7 g/dL (ref 11.6–15.9)
LYMPH%: 17.1 % (ref 14.0–49.7)
MCHC: 34.6 g/dL (ref 31.5–36.0)
MONO#: 0.7 10*3/uL (ref 0.1–0.9)
NEUT%: 74.4 % (ref 38.4–76.8)
Platelets: 219 10*3/uL (ref 145–400)
WBC: 8.3 10*3/uL (ref 3.9–10.3)
lymph#: 1.4 10*3/uL (ref 0.9–3.3)

## 2012-08-20 MED ORDER — HEPARIN SOD (PORK) LOCK FLUSH 100 UNIT/ML IV SOLN
500.0000 [IU] | Freq: Once | INTRAVENOUS | Status: AC | PRN
  Administered 2012-08-20: 500 [IU]
  Filled 2012-08-20: qty 5

## 2012-08-20 MED ORDER — PALONOSETRON HCL INJECTION 0.25 MG/5ML
0.2500 mg | Freq: Once | INTRAVENOUS | Status: AC
  Administered 2012-08-20: 0.25 mg via INTRAVENOUS

## 2012-08-20 MED ORDER — EPIRUBICIN HCL CHEMO IV INJECTION 200 MG/100ML
100.0000 mg/m2 | Freq: Once | INTRAVENOUS | Status: AC
  Administered 2012-08-20: 166 mg via INTRAVENOUS
  Filled 2012-08-20: qty 83

## 2012-08-20 MED ORDER — DEXAMETHASONE SODIUM PHOSPHATE 4 MG/ML IJ SOLN
12.0000 mg | Freq: Once | INTRAMUSCULAR | Status: AC
  Administered 2012-08-20: 12 mg via INTRAVENOUS

## 2012-08-20 MED ORDER — SODIUM CHLORIDE 0.9 % IV SOLN
Freq: Once | INTRAVENOUS | Status: AC
  Administered 2012-08-20: 14:00:00 via INTRAVENOUS

## 2012-08-20 MED ORDER — SODIUM CHLORIDE 0.9 % IV SOLN
150.0000 mg | Freq: Once | INTRAVENOUS | Status: AC
  Administered 2012-08-20: 150 mg via INTRAVENOUS
  Filled 2012-08-20: qty 5

## 2012-08-20 MED ORDER — ALTEPLASE 2 MG IJ SOLR
2.0000 mg | Freq: Once | INTRAMUSCULAR | Status: AC | PRN
  Administered 2012-08-20: 2 mg
  Filled 2012-08-20: qty 2

## 2012-08-20 MED ORDER — SODIUM CHLORIDE 0.9 % IV SOLN
500.0000 mg/m2 | Freq: Once | INTRAVENOUS | Status: AC
  Administered 2012-08-20: 820 mg via INTRAVENOUS
  Filled 2012-08-20: qty 41

## 2012-08-20 MED ORDER — FLUOROURACIL CHEMO INJECTION 2.5 GM/50ML
500.0000 mg/m2 | Freq: Once | INTRAVENOUS | Status: AC
  Administered 2012-08-20: 850 mg via INTRAVENOUS
  Filled 2012-08-20: qty 17

## 2012-08-20 MED ORDER — SODIUM CHLORIDE 0.9 % IJ SOLN
10.0000 mL | INTRAMUSCULAR | Status: DC | PRN
  Administered 2012-08-20: 10 mL
  Filled 2012-08-20: qty 10

## 2012-08-20 NOTE — Patient Instructions (Addendum)
Proceed with chemotherapy today  I will see you back in 1 week for  Interim labs and office visit

## 2012-08-20 NOTE — Progress Notes (Signed)
PAC flushes easily but no blood return after mutiple efforts.  Cath flo instilled at 1120.  Checked every 30 minutes.  Excellent blood return at 1330.  4cc serum  Withdrawn.

## 2012-08-20 NOTE — Progress Notes (Signed)
OFFICE PROGRESS NOTE  CC  [name], MD [address] [place] [address]  DIAGNOSIS: 66 year old female with new diagnosis of inflammatory breast cancer  PRIOR THERAPY: #1 patient was originally seen in my clinic for new diagnosis of inflammatory breast cancer she was referred by Dr. Emelia Loron. She had a mammogram performed That showed an abnormality. That was biopsied and it showed an invasive mammary carcinoma with lymphovascular invasion grade 3 ER negative PR negative HER-2/neu positive.  #2 patient has gone on to have MRI of the breasts performed the MRI shows diffuse right breast neoplasm on a large level I right axillary lymph nodes compatible with lymphatic spread.Patient has had a biopsy of the right axillary lymph node that is compatible with invasive mammary carcinoma.  #3 patient  began Neoadjuvant FEC 100 with day 2 Neulasta on 07/23/2012  CURRENT THERAPY:Patient is here for cycle #3 of FEC 100 with day 2 Neulasta.  INTERVAL HISTORY: Jessica Wells 66 y.o. female returns for Followup visit .Overall patient is doing well. She tolerated cycle 2 very well without any significant problems. She did become neutropenic did not develop any fevers. Today she denies any fevers chills night sweats headaches shortness of breath chest pains palpitations she has no nausea or vomiting she is a little bit fatigued she has no myalgias or arthralgias. Remainder of the 10 point review of systems is negative. MEDICAL HISTORY: Past Medical History  Diagnosis Date  . Asthma   . GERD (gastroesophageal reflux disease)   . PONV (postoperative nausea and vomiting)     severe  . Dysrhythmia     PAT-sees dr Laurence Compton meds  . Breast cancer     inflammatory right breast ca  . Allergy   . PAT (paroxysmal atrial tachycardia)     hx  . Arthritis     osteopenia,knees    ALLERGIES:  is allergic to anesthetics, amide; bactrim; sulfa antibiotics; codeine; codeine  phosphate; and eggs or egg-derived products.  MEDICATIONS:  Current Outpatient Prescriptions  Medication Sig Dispense Refill  . albuterol (PROVENTIL HFA;VENTOLIN HFA) 108 (90 BASE) MCG/ACT inhaler Inhale 2 puffs into the lungs every 6 (six) hours as needed.      Marland Kitchen aspirin EC 81 MG tablet Take 162 mg by mouth every 6 (six) hours as needed. For blood clot symptoms      . calcium-vitamin D (OSCAL WITH D) 500-200 MG-UNIT per tablet Take 1 tablet by mouth daily.      . ciprofloxacin (CIPRO) 500 MG tablet Take 1 tablet (500 mg total) by mouth 2 (two) times daily.  14 tablet  5  . dexamethasone (DECADRON) 4 MG tablet Take 2 tablets by mouth once a day on the day after chemotherapy and then take 2 tablets two times a day for 2 days. Take with food.  30 tablet  1  . fish oil-omega-3 fatty acids 1000 MG capsule Take 2 g by mouth daily.      . Flaxseed, Linseed, (FLAX SEEDS PO) Take 1 tablet by mouth daily.      . fluticasone (FLONASE) 50 MCG/ACT nasal spray Place 1 spray into the nose daily.      Marland Kitchen glucosamine-chondroitin 500-400 MG tablet Take 1 tablet by mouth daily.      Marland Kitchen HYDROcodone-acetaminophen (NORCO/VICODIN) 5-325 MG per tablet       . lidocaine-prilocaine (EMLA) cream Apply topically as needed.  30 g  5  . LORazepam (ATIVAN) 0.5 MG tablet Take 1 tablet (0.5 mg total) by  mouth every 6 (six) hours as needed (Nausea or vomiting).  30 tablet  0  . Multiple Vitamin (MULTIVITAMIN WITH MINERALS) TABS Take 1 tablet by mouth daily.      Marland Kitchen omeprazole (PRILOSEC) 20 MG capsule Take 20 mg by mouth daily.      . ondansetron (ZOFRAN) 8 MG tablet Take 1 tablet two times a day as needed for nausea or vomiting starting on the third day after chemotherapy.  30 tablet  1  . ondansetron (ZOFRAN-ODT) 8 MG disintegrating tablet       . oxyCODONE-acetaminophen (PERCOCET/ROXICET) 5-325 MG per tablet       . prochlorperazine (COMPAZINE) 10 MG tablet Take 1 tablet (10 mg total) by mouth every 6 (six) hours as needed  (Nausea or vomiting).  30 tablet  1  . prochlorperazine (COMPAZINE) 25 MG suppository Place 1 suppository (25 mg total) rectally every 12 (twelve) hours as needed for nausea.  12 suppository  3  . valACYclovir (VALTREX) 500 MG tablet Take 1 tablet (500 mg total) by mouth 3 (three) times daily.  90 tablet  6    SURGICAL HISTORY:  Past Surgical History  Procedure Date  . Dilation and curettage of uterus     x3  . Tonsillectomy   . Colonoscopy   . Portacath placement 07/21/2012    Procedure: INSERTION PORT-A-CATH;  Surgeon: Emelia Loron, MD;  Location:  SUR[facility]ERY [facility];  Service: General;  Laterality: Left;    REVIEW OF SYSTEMS:  Pertinent items are noted in HPI.   PHYSICAL EXAMINATION: General appearance: alert and cooperative Lymph nodes: noted to have a palpable right axillary lymph node measuring about 2 cm. No supraclavicular adenopathy cervical lymphadenopathy inguinal or left axillary lymphadenopathy. Resp: clear to auscultation bilaterally Back: symmetric, no curvature. ROM normal. No CVA tenderness. Cardio: regular rate and rhythm, S1, S2 normal, no murmur, click, rub or gallop GI: soft, non-tender; bowel sounds normal; no masses,  no organomegaly Extremities: extremities normal, atraumatic, no cyanosis or edema Neurologic: Grossly normal Right breast reveals grossly enlarged breast with Dimpling of the skin And retraction of the nipple.Left breast no masses or nipple discharge or skin changes.  ECOG PERFORMANCE STATUS: 0 - Asymptomatic  Blood pressure 149/82, pulse 114, temperature 98.7 F (37.1 C), temperature source Oral, resp. rate 20, height 5\' 1"  (1.549 m), weight 135 lb 9.6 oz (61.508 kg).  LABORATORY DATA: Lab Results  Component Value Date   WBC 8.3 08/20/2012   HGB 11.7 08/20/2012   HCT 33.7* 08/20/2012   MCV 87.4 08/20/2012   PLT 219 08/20/2012      Chemistry      Component Value Date/Time   NA 140 08/20/2012 0908   NA 140 07/19/2012 1538   K 4.2  08/20/2012 0908   K 3.7 07/19/2012 1538   CL 103 08/20/2012 0908   CL 103 07/19/2012 1538   CO2 25 08/20/2012 0908   CO2 27 07/19/2012 1538   BUN 13.0 08/20/2012 0908   BUN 15 07/19/2012 1538   CREATININE 0.9 08/20/2012 0908   CREATININE 0.77 07/19/2012 1538      Component Value Date/Time   CALCIUM 9.7 08/20/2012 0908   CALCIUM 9.3 07/19/2012 1538   ALKPHOS 102 08/20/2012 0908   ALKPHOS 60 07/19/2012 1538   AST 18 08/20/2012 0908   AST 17 07/19/2012 1538   ALT 24 08/20/2012 0908   ALT 16 07/19/2012 1538   BILITOT 0.30 08/20/2012 0908   BILITOT 0.2* 07/19/2012 1538  RADIOGRAPHIC STUDIES:  ASSESSMENT: 66 year old female with  #1 inflammatory right breast cancer with positive lymph nodes the tumor is ER negative PR negative HER-2/neu positive. Patient is  to receive neoadjuvant chemotherapy intially Consisting of FEC q 2 weeks x 4 cycles. Then she will receive taxol and herceptin q week x 12 weeks.Once patient completes her chemotherapy then we will get restaging MRIs performed to evaluate response to therapy. Patient does understand that she will most likely need a mastectomy because she does have inflammatory breast cancer.  PLAN:  #1 patient will proceed with cycle #3 of neoadjuvant FEC with day 2 Neulasta tomorrow.  #2 she will return in one week's time for in terms labs  All questions were answered. The patient knows to call the clinic with any problems, questions or concerns. We can certainly see the patient much sooner if necessary.  I spent 25 minutes counseling the patient face to face. The total time spent in the appointment was 30 minutes.    Drue Second, MD Medical/Oncology [facility] [phone] (beeper) [phone] (Office)

## 2012-08-20 NOTE — Patient Instructions (Addendum)
[facility] Discharge Instructions for Patients Receiving Chemotherapy  Today you received the following chemotherapy agents cytoxan, 22fu, ellence. To help prevent nausea and vomiting after your treatment, we encourage you to take your nausea medication  Begin taking it at tonight  and take it as often as prescribed for the next  72 hours.   If you develop nausea and vomiting that is not controlled by your nausea medication, call the clinic. If it is after clinic hours your family physician or the after hours number for the clinic or go to the Emergency Department.   BELOW ARE SYMPTOMS THAT SHOULD BE REPORTED IMMEDIATELY:  *FEVER GREATER THAN 100.5 F  *CHILLS WITH OR WITHOUT FEVER  NAUSEA AND VOMITING THAT IS NOT CONTROLLED WITH YOUR NAUSEA MEDICATION  *UNUSUAL SHORTNESS OF BREATH  *UNUSUAL BRUISING OR BLEEDING  TENDERNESS IN MOUTH AND THROAT WITH OR WITHOUT PRESENCE OF ULCERS  *URINARY PROBLEMS  *BOWEL PROBLEMS  UNUSUAL RASH Items with * indicate a potential emergency and should be followed up as soon as possible.  One of the nurses will contact you 24 hours after your treatment. Please let the nurse know about any problems that you may have experienced. Feel free to call the clinic you have any questions or concerns. The clinic phone number is [phone].   I have been informed and understand all the instructions given to me. I know to contact the clinic, my physician, or go to the Emergency Department if any problems should occur. I do not have any questions at this time, but understand that I may call the clinic during office hours   should I have any questions or need assistance in obtaining follow up care.    __________________________________________  _____________  __________ Signature of Patient or Authorized Representative            Date                   Time    __________________________________________ Nurse's Signature

## 2012-08-20 NOTE — Telephone Encounter (Signed)
As of 08-20-2012 nothing needs to be scheduled

## 2012-08-21 ENCOUNTER — Ambulatory Visit (HOSPITAL_BASED_OUTPATIENT_CLINIC_OR_DEPARTMENT_OTHER): Payer: Medicare Other

## 2012-08-21 VITALS — BP 154/78 | HR 98 | Temp 98.9°F | Resp 20

## 2012-08-21 DIAGNOSIS — C50119 Malignant neoplasm of central portion of unspecified female breast: Secondary | ICD-10-CM

## 2012-08-21 MED ORDER — PEGFILGRASTIM INJECTION 6 MG/0.6ML
6.0000 mg | Freq: Once | SUBCUTANEOUS | Status: AC
  Administered 2012-08-21: 6 mg via SUBCUTANEOUS

## 2012-08-23 ENCOUNTER — Other Ambulatory Visit: Payer: Self-pay | Admitting: Certified Registered Nurse Anesthetist

## 2012-08-23 ENCOUNTER — Telehealth: Payer: Self-pay | Admitting: Oncology

## 2012-08-23 NOTE — Telephone Encounter (Signed)
lmonvm of dr Enzo Bi regarding this pt needing to see her as soon as she is available.

## 2012-08-27 ENCOUNTER — Ambulatory Visit (HOSPITAL_BASED_OUTPATIENT_CLINIC_OR_DEPARTMENT_OTHER): Payer: Medicare Other | Admitting: Oncology

## 2012-08-27 ENCOUNTER — Other Ambulatory Visit (HOSPITAL_BASED_OUTPATIENT_CLINIC_OR_DEPARTMENT_OTHER): Payer: Medicare Other | Admitting: Lab

## 2012-08-27 ENCOUNTER — Encounter: Payer: Self-pay | Admitting: Oncology

## 2012-08-27 ENCOUNTER — Telehealth: Payer: Self-pay | Admitting: *Deleted

## 2012-08-27 ENCOUNTER — Other Ambulatory Visit: Payer: Medicare Other | Admitting: Lab

## 2012-08-27 VITALS — BP 107/70 | HR 114 | Temp 98.4°F | Resp 18 | Ht 61.0 in | Wt 137.4 lb

## 2012-08-27 DIAGNOSIS — C773 Secondary and unspecified malignant neoplasm of axilla and upper limb lymph nodes: Secondary | ICD-10-CM

## 2012-08-27 DIAGNOSIS — B009 Herpesviral infection, unspecified: Secondary | ICD-10-CM | POA: Diagnosis not present

## 2012-08-27 DIAGNOSIS — D709 Neutropenia, unspecified: Secondary | ICD-10-CM | POA: Diagnosis not present

## 2012-08-27 DIAGNOSIS — C50119 Malignant neoplasm of central portion of unspecified female breast: Secondary | ICD-10-CM

## 2012-08-27 LAB — CBC WITH DIFFERENTIAL/PLATELET
Basophils Absolute: 0 10*3/uL (ref 0.0–0.1)
Eosinophils Absolute: 0 10*3/uL (ref 0.0–0.5)
HCT: 30.4 % — ABNORMAL LOW (ref 34.8–46.6)
HGB: 10.6 g/dL — ABNORMAL LOW (ref 11.6–15.9)
MONO#: 0 10*3/uL — ABNORMAL LOW (ref 0.1–0.9)
NEUT%: 44.7 % (ref 38.4–76.8)
WBC: 0.9 10*3/uL — CL (ref 3.9–10.3)
lymph#: 0.4 10*3/uL — ABNORMAL LOW (ref 0.9–3.3)

## 2012-08-27 LAB — COMPREHENSIVE METABOLIC PANEL (CC13)
ALT: 18 U/L (ref 0–55)
Albumin: 3.5 g/dL (ref 3.5–5.0)
CO2: 25 mEq/L (ref 22–29)
Calcium: 9.2 mg/dL (ref 8.4–10.4)
Chloride: 99 mEq/L (ref 98–107)
Glucose: 184 mg/dl — ABNORMAL HIGH (ref 70–99)
Potassium: 3.6 mEq/L (ref 3.5–5.1)
Sodium: 134 mEq/L — ABNORMAL LOW (ref 136–145)
Total Bilirubin: 0.8 mg/dL (ref 0.20–1.20)
Total Protein: 5.8 g/dL — ABNORMAL LOW (ref 6.4–8.3)

## 2012-08-27 NOTE — Telephone Encounter (Signed)
AS OF 08-27-2012 NOTHING TO SCHEDULE FOR THE PATIENT

## 2012-08-27 NOTE — Progress Notes (Signed)
OFFICE PROGRESS NOTE  CC  BARNES,ELIZABETH STEWART, MD [address] [place] [address]  DIAGNOSIS: 66-year-old female with new diagnosis of inflammatory breast cancer  PRIOR THERAPY: #1 patient was originally seen in my clinic for new diagnosis of inflammatory breast cancer she was referred by Dr. Matthew Wakefield. She had a mammogram performed That showed an abnormality. That was biopsied and it showed an invasive mammary carcinoma with lymphovascular invasion grade 3 ER negative PR negative HER-2/neu positive.  #2 patient has gone on to have MRI of the breasts performed the MRI shows diffuse right breast neoplasm on a large level I right axillary lymph nodes compatible with lymphatic spread.Patient has had a biopsy of the right axillary lymph node that is compatible with invasive mammary carcinoma.  #3 patient  began FEC 100 with day 2 Neulasta on 07/23/2012  CURRENT THERAPY: s/p cycle 3 day 1 of FEC on 08/20/12  INTERVAL HISTORY: Jessica Wells 66 y.o. female returns for Followup visit . Overall patient is doing well. She is fatigued  more than she had been previously. She however has not had any nausea or vomiting no fevers chills night sweats headaches she has no shortness of breath no chest pains or palpitations. No bleeding problems. She remains on Valtrex tolerating it well. Remainder of the 10 point review of systems is negative.  MEDICAL HISTORY: Past Medical History  Diagnosis Date  . Asthma   . GERD (gastroesophageal reflux disease)   . PONV (postoperative nausea and vomiting)     severe  . Dysrhythmia     PAT-sees dr scains-no meds  . Breast cancer     inflammatory right breast ca  . Allergy   . PAT (paroxysmal atrial tachycardia)     hx  . Arthritis     osteopenia,knees    ALLERGIES:  is allergic to anesthetics, amide; bactrim; sulfa antibiotics; codeine; codeine phosphate; and eggs or egg-derived products.  MEDICATIONS:  Current Outpatient  Prescriptions  Medication Sig Dispense Refill  . albuterol (PROVENTIL HFA;VENTOLIN HFA) 108 (90 BASE) MCG/ACT inhaler Inhale 2 puffs into the lungs every 6 (six) hours as needed.      . aspirin EC 81 MG tablet Take 162 mg by mouth every 6 (six) hours as needed. For blood clot symptoms      . calcium-vitamin D (OSCAL WITH D) 500-200 MG-UNIT per tablet Take 1 tablet by mouth daily.      . dexamethasone (DECADRON) 4 MG tablet Take 2 tablets by mouth once a day on the day after chemotherapy and then take 2 tablets two times a day for 2 days. Take with food.  30 tablet  1  . fish oil-omega-3 fatty acids 1000 MG capsule Take 2 g by mouth daily.      . Flaxseed, Linseed, (FLAX SEEDS PO) Take 1 tablet by mouth daily.      . fluticasone (FLONASE) 50 MCG/ACT nasal spray Place 1 spray into the nose daily.      . glucosamine-chondroitin 500-400 MG tablet Take 1 tablet by mouth daily.      . HYDROcodone-acetaminophen (NORCO/VICODIN) 5-325 MG per tablet       . lidocaine-prilocaine (EMLA) cream Apply topically as needed.  30 g  5  . LORazepam (ATIVAN) 0.5 MG tablet Take 1 tablet (0.5 mg total) by mouth every 6 (six) hours as needed (Nausea or vomiting).  30 tablet  0  . Multiple Vitamin (MULTIVITAMIN WITH MINERALS) TABS Take 1 tablet by mouth daily.      .   omeprazole (PRILOSEC) 20 MG capsule Take 20 mg by mouth daily.      . ondansetron (ZOFRAN) 8 MG tablet Take 1 tablet two times a day as needed for nausea or vomiting starting on the third day after chemotherapy.  30 tablet  1  . ondansetron (ZOFRAN-ODT) 8 MG disintegrating tablet       . oxyCODONE-acetaminophen (PERCOCET/ROXICET) 5-325 MG per tablet       . prochlorperazine (COMPAZINE) 10 MG tablet Take 1 tablet (10 mg total) by mouth every 6 (six) hours as needed (Nausea or vomiting).  30 tablet  1  . prochlorperazine (COMPAZINE) 25 MG suppository Place 1 suppository (25 mg total) rectally every 12 (twelve) hours as needed for nausea.  12 suppository  3  .  valACYclovir (VALTREX) 500 MG tablet Take 1 tablet (500 mg total) by mouth 3 (three) times daily.  90 tablet  6    SURGICAL HISTORY:  Past Surgical History  Procedure Date  . Dilation and curettage of uterus     x3  . Tonsillectomy   . Colonoscopy   . Portacath placement 07/21/2012    Procedure: INSERTION PORT-A-CATH;  Surgeon: Matthew Wakefield, MD;  Location: [facility];  Service: General;  Laterality: Left;    REVIEW OF SYSTEMS:  Pertinent items are noted in HPI.   PHYSICAL EXAMINATION: General appearance: alert and cooperative Lymph nodes: noted to have a palpable right axillary lymph node measuring about 2 cm. No supraclavicular adenopathy cervical lymphadenopathy inguinal or left axillary lymphadenopathy. Resp: clear to auscultation bilaterally Back: symmetric, no curvature. ROM normal. No CVA tenderness. Cardio: regular rate and rhythm, S1, S2 normal, no murmur, click, rub or gallop GI: soft, non-tender; bowel sounds normal; no masses,  no organomegaly Extremities: extremities normal, atraumatic, no cyanosis or edema Neurologic: Grossly normal Right breast reveals grossly enlarged breast with Dimpling of the skin And retraction of the nipple.Left breast no masses or nipple discharge or skin changes.  ECOG PERFORMANCE STATUS: 0 - Asymptomatic  Blood pressure 107/70, pulse 114, temperature 98.4 F (36.9 C), temperature source Oral, resp. rate 18, height 5' 1" (1.549 m), weight 137 lb 6.4 oz (62.324 kg).  LABORATORY DATA: Lab Results  Component Value Date   WBC 0.9* 08/27/2012   HGB 10.6* 08/27/2012   HCT 30.4* 08/27/2012   MCV 84.7 08/27/2012   PLT 67* 08/27/2012      Chemistry      Component Value Date/Time   NA 134* 08/27/2012 1143   NA 140 07/19/2012 1538   K 3.6 08/27/2012 1143   K 3.7 07/19/2012 1538   CL 99 08/27/2012 1143   CL 103 07/19/2012 1538   CO2 25 08/27/2012 1143   CO2 27 07/19/2012 1538   BUN 15.0 08/27/2012 1143   BUN 15 07/19/2012 1538    CREATININE 0.8 08/27/2012 1143   CREATININE 0.77 07/19/2012 1538      Component Value Date/Time   CALCIUM 9.2 08/27/2012 1143   CALCIUM 9.3 07/19/2012 1538   ALKPHOS 104 08/27/2012 1143   ALKPHOS 60 07/19/2012 1538   AST 11 08/27/2012 1143   AST 17 07/19/2012 1538   ALT 18 08/27/2012 1143   ALT 16 07/19/2012 1538   BILITOT 0.80 08/27/2012 1143   BILITOT 0.2* 07/19/2012 1538       RADIOGRAPHIC STUDIES:  ASSESSMENT: 66-year-old female with  #1 inflammatory right breast cancer with positive lymph nodes the tumor is ER negative PR negative HER-2/neu positive. Patient is  to receive neoadjuvant chemotherapy   intially Consisting of FEC q 2 weeks x 4 cycles. Then she will receive taxol and herceptin q week x 12 weeks.Once patient completes her chemotherapy then we will get restaging MRIs performed to evaluate response to therapy. Patient does understand that she will most likely need a mastectomy because she does have inflammatory breast cancer.   PLAN:  #1 patient is neutropenic neutropenic precautions were discussed with her. I will go ahead and get her started on Cipro 500 mg twice a day. Patient also has had a herpetic outbreak and will remain on Valtrex.  #2 patient will return in one week's time for followup for cycle 4 of her chemotherapy.  All questions were answered. The patient knows to call the clinic with any problems, questions or concerns. We can certainly see the patient much sooner if necessary.  I spent 25 minutes counseling the patient face to face. The total time spent in the appointment was 30 minutes.    Oather Muilenburg, MD Medical/Oncology [facility] [phone] (beeper) [phone] (Office)              

## 2012-08-27 NOTE — Patient Instructions (Addendum)
You are neutropenic so please practice neutropenic precautions  Start cipro 500 mg twice a day  I will see you back in 1 week   Patient Neutropenia Instruction Sheet  Diagnosis: Breast Cancer      Treating Physician: Drue Second, MD  Treatment: 1. Type of chemotherapy: FEC dose dense  2. Date of last treatment: 08/20/12  Last Blood Counts: Lab Results  Component Value Date   WBC 0.9* 08/27/2012   HGB 10.6* 08/27/2012   HCT 30.4* 08/27/2012   MCV 84.7 08/27/2012   PLT 67* 08/27/2012        Prophylactic Antibiotics: Cipro 500 mg by mouth twice a day Instructions: 1. Monitor temperature and call if fever  greater than 100.5, chills, shaking chills (rigors) 2. Call Physician on-call at [phone] 3. Give him/her symptoms and list of medications that you are taking and your last blood count.

## 2012-08-30 NOTE — Addendum Note (Signed)
Encounter addended by: Delynn Flavin, RN on: 08/30/2012  8:20 PM<BR>     Documentation filed: Charges VN

## 2012-09-03 ENCOUNTER — Other Ambulatory Visit (HOSPITAL_BASED_OUTPATIENT_CLINIC_OR_DEPARTMENT_OTHER): Payer: Medicare Other | Admitting: Lab

## 2012-09-03 ENCOUNTER — Encounter: Payer: Self-pay | Admitting: Oncology

## 2012-09-03 ENCOUNTER — Telehealth: Payer: Self-pay | Admitting: *Deleted

## 2012-09-03 ENCOUNTER — Ambulatory Visit (HOSPITAL_BASED_OUTPATIENT_CLINIC_OR_DEPARTMENT_OTHER): Payer: Medicare Other | Admitting: Oncology

## 2012-09-03 ENCOUNTER — Other Ambulatory Visit: Payer: Medicare Other | Admitting: Lab

## 2012-09-03 ENCOUNTER — Ambulatory Visit (HOSPITAL_BASED_OUTPATIENT_CLINIC_OR_DEPARTMENT_OTHER): Payer: Medicare Other

## 2012-09-03 VITALS — BP 152/75 | HR 90 | Temp 98.8°F | Resp 20 | Ht 61.0 in | Wt 138.6 lb

## 2012-09-03 DIAGNOSIS — Z452 Encounter for adjustment and management of vascular access device: Secondary | ICD-10-CM

## 2012-09-03 DIAGNOSIS — Z171 Estrogen receptor negative status [ER-]: Secondary | ICD-10-CM

## 2012-09-03 DIAGNOSIS — Z5111 Encounter for antineoplastic chemotherapy: Secondary | ICD-10-CM | POA: Diagnosis not present

## 2012-09-03 DIAGNOSIS — C773 Secondary and unspecified malignant neoplasm of axilla and upper limb lymph nodes: Secondary | ICD-10-CM

## 2012-09-03 DIAGNOSIS — C50119 Malignant neoplasm of central portion of unspecified female breast: Secondary | ICD-10-CM | POA: Diagnosis not present

## 2012-09-03 LAB — CBC WITH DIFFERENTIAL/PLATELET
BASO%: 2 % (ref 0.0–2.0)
LYMPH%: 14.5 % (ref 14.0–49.7)
MCHC: 33.4 g/dL (ref 31.5–36.0)
MONO#: 1 10*3/uL — ABNORMAL HIGH (ref 0.1–0.9)
MONO%: 9.9 % (ref 0.0–14.0)
Platelets: 115 10*3/uL — ABNORMAL LOW (ref 145–400)
RBC: 3.42 10*6/uL — ABNORMAL LOW (ref 3.70–5.45)
RDW: 16.3 % — ABNORMAL HIGH (ref 11.2–14.5)
WBC: 10.2 10*3/uL (ref 3.9–10.3)
nRBC: 2 % — ABNORMAL HIGH (ref 0–0)

## 2012-09-03 LAB — COMPREHENSIVE METABOLIC PANEL (CC13)
ALT: 24 U/L (ref 0–55)
AST: 20 U/L (ref 5–34)
Chloride: 105 mEq/L (ref 98–107)
Creatinine: 0.8 mg/dL (ref 0.6–1.1)
Sodium: 140 mEq/L (ref 136–145)
Total Bilirubin: 0.4 mg/dL (ref 0.20–1.20)

## 2012-09-03 MED ORDER — SODIUM CHLORIDE 0.9 % IV SOLN
500.0000 mg/m2 | Freq: Once | INTRAVENOUS | Status: AC
  Administered 2012-09-03: 820 mg via INTRAVENOUS
  Filled 2012-09-03: qty 41

## 2012-09-03 MED ORDER — ALTEPLASE 2 MG IJ SOLR
2.0000 mg | Freq: Once | INTRAMUSCULAR | Status: AC | PRN
  Administered 2012-09-03: 2 mg
  Filled 2012-09-03: qty 2

## 2012-09-03 MED ORDER — PALONOSETRON HCL INJECTION 0.25 MG/5ML
0.2500 mg | Freq: Once | INTRAVENOUS | Status: AC
  Administered 2012-09-03: 0.25 mg via INTRAVENOUS

## 2012-09-03 MED ORDER — EPIRUBICIN HCL CHEMO IV INJECTION 200 MG/100ML
100.0000 mg/m2 | Freq: Once | INTRAVENOUS | Status: AC
  Administered 2012-09-03: 166 mg via INTRAVENOUS
  Filled 2012-09-03: qty 83

## 2012-09-03 MED ORDER — FLUOROURACIL CHEMO INJECTION 2.5 GM/50ML
500.0000 mg/m2 | Freq: Once | INTRAVENOUS | Status: AC
  Administered 2012-09-03: 850 mg via INTRAVENOUS
  Filled 2012-09-03: qty 17

## 2012-09-03 MED ORDER — SODIUM CHLORIDE 0.9 % IV SOLN
150.0000 mg | Freq: Once | INTRAVENOUS | Status: AC
  Administered 2012-09-03: 150 mg via INTRAVENOUS
  Filled 2012-09-03: qty 5

## 2012-09-03 MED ORDER — DEXAMETHASONE SODIUM PHOSPHATE 4 MG/ML IJ SOLN
12.0000 mg | Freq: Once | INTRAMUSCULAR | Status: AC
  Administered 2012-09-03: 12 mg via INTRAVENOUS

## 2012-09-03 NOTE — Progress Notes (Signed)
OFFICE PROGRESS NOTE  CC  Jessica Alken, MD [address] [place] [address]  DIAGNOSIS: 66 year old female with new diagnosis of inflammatory breast cancer  PRIOR THERAPY: #1 patient was originally seen in my clinic for new diagnosis of inflammatory breast cancer she was referred by Dr. Emelia Loron. She had a mammogram performed That showed an abnormality. That was biopsied and it showed an invasive mammary carcinoma with lymphovascular invasion grade 3 ER negative PR negative HER-2/neu positive.  #2 patient has gone on to have MRI of the breasts performed the MRI shows diffuse right breast neoplasm on a large level I right axillary lymph nodes compatible with lymphatic spread.Patient has had a biopsy of the right axillary lymph node that is compatible with invasive mammary carcinoma.  #3 patient  began FEC 100 with day 2 Neulasta on 07/23/2012  CURRENT THERAPY: Cycle 4 day 1 FEC  INTERVAL HISTORY: Jessica Wells 66 y.o. female returns for Followup visit . She received her FEC two weeks ago, and has been feeling increasingly energized since her visit last week.  She has noticed a decrease in the erythema and size of the breast.  Otherwise she is w/o questions or concerns.    MEDICAL HISTORY: Past Medical History  Diagnosis Date  . Asthma   . GERD (gastroesophageal reflux disease)   . PONV (postoperative nausea and vomiting)     severe  . Dysrhythmia     PAT-sees dr Laurence Compton meds  . Breast cancer     inflammatory right breast ca  . Allergy   . PAT (paroxysmal atrial tachycardia)     hx  . Arthritis     osteopenia,knees    ALLERGIES:  is allergic to anesthetics, amide; bactrim; sulfa antibiotics; codeine; codeine phosphate; and eggs or egg-derived products.  MEDICATIONS:  Current Outpatient Prescriptions  Medication Sig Dispense Refill  . aspirin EC 81 MG tablet Take 162 mg by mouth every 6 (six) hours as needed. For blood clot symptoms      .  calcium-vitamin D (OSCAL WITH D) 500-200 MG-UNIT per tablet Take 1 tablet by mouth daily.      Marland Kitchen dexamethasone (DECADRON) 4 MG tablet Take 2 tablets by mouth once a day on the day after chemotherapy and then take 2 tablets two times a day for 2 days. Take with food.  30 tablet  1  . fish oil-omega-3 fatty acids 1000 MG capsule Take 2 g by mouth daily.      . Flaxseed, Linseed, (FLAX SEEDS PO) Take 1 tablet by mouth daily.      Marland Kitchen glucosamine-chondroitin 500-400 MG tablet Take 1 tablet by mouth daily.      Marland Kitchen lidocaine-prilocaine (EMLA) cream Apply topically as needed.  30 g  5  . Multiple Vitamin (MULTIVITAMIN WITH MINERALS) TABS Take 1 tablet by mouth daily.      Marland Kitchen omeprazole (PRILOSEC) 20 MG capsule Take 20 mg by mouth daily.      . valACYclovir (VALTREX) 500 MG tablet Take 1 tablet (500 mg total) by mouth 3 (three) times daily.  90 tablet  6  . albuterol (PROVENTIL HFA;VENTOLIN HFA) 108 (90 BASE) MCG/ACT inhaler Inhale 2 puffs into the lungs every 6 (six) hours as needed.      . fluticasone (FLONASE) 50 MCG/ACT nasal spray Place 1 spray into the nose daily.      Marland Kitchen HYDROcodone-acetaminophen (NORCO/VICODIN) 5-325 MG per tablet       . LORazepam (ATIVAN) 0.5 MG tablet Take 1 tablet (  0.5 mg total) by mouth every 6 (six) hours as needed (Nausea or vomiting).  30 tablet  0  . ondansetron (ZOFRAN) 8 MG tablet Take 1 tablet two times a day as needed for nausea or vomiting starting on the third day after chemotherapy.  30 tablet  1  . ondansetron (ZOFRAN-ODT) 8 MG disintegrating tablet       . oxyCODONE-acetaminophen (PERCOCET/ROXICET) 5-325 MG per tablet       . prochlorperazine (COMPAZINE) 10 MG tablet Take 1 tablet (10 mg total) by mouth every 6 (six) hours as needed (Nausea or vomiting).  30 tablet  1  . prochlorperazine (COMPAZINE) 25 MG suppository Place 1 suppository (25 mg total) rectally every 12 (twelve) hours as needed for nausea.  12 suppository  3    SURGICAL HISTORY:  Past Surgical History   Procedure Date  . Dilation and curettage of uterus     x3  . Tonsillectomy   . Colonoscopy   . Portacath placement 07/21/2012    Procedure: INSERTION PORT-A-CATH;  Surgeon: Emelia Loron, MD;  Location: [facility];  Service: General;  Laterality: Left;    REVIEW OF SYSTEMS:   General: fatigue (+), night sweats (-), fever (-), pain (-) Lymph: palpable nodes (-) HEENT: vision changes (-), mucositis (-), gum bleeding (-), epistaxis (-) Cardiovascular: chest pain (-), palpitations (-) Pulmonary: shortness of breath (-), dyspnea (-), cough (-), hemoptysis (-) GI:  Early satiety (-), melena (-), dysphagia (-), nausea/vomiting (-), diarrhea (-) GU: dysuria (-), hematuria (-), incontinence (-) Musculoskeletal: joint swelling (-), joint pain (-), back pain (-) Neuro: weakness (-), numbness (-), headache (-), confusion (-) Skin: Rash (-), lesions (-), dryness (-) Psych: depression (-), suicidal/homicidal ideation (-), feeling of hopelessness (-)   PHYSICAL EXAMINATION:  BP 152/75  Pulse 90  Temp 98.8 F (37.1 C)  Resp 20  Ht 5\' 1"  (1.549 m)  Wt 138 lb 9.6 oz (62.869 kg)  BMI 26.19 kg/m2 General: Patient is a well appearing female in no acute distress HEENT: PERRLA, sclerae anicteric no conjunctival pallor, MMM Neck: supple, no palpable adenopathy Lungs: clear to auscultation bilaterally, no wheezes, rhonchi, or rales Cardiovascular: regular rate rhythm, S1, S2, no murmurs, rubs or gallops Abdomen: Soft, non-tender, non-distended, normoactive bowel sounds, no HSM Extremities: warm and well perfused, no clubbing, cyanosis, or edema Skin: No rashes or lesions Neuro: Non-focal Right breast reveals grossly enlarged breast with Dimpling of the skin And retraction of the nipple ECOG PERFORMANCE STATUS: 0 - Asymptomatic   LABORATORY DATA: Lab Results  Component Value Date   WBC 10.2 09/03/2012   HGB 10.1* 09/03/2012   HCT 30.2* 09/03/2012   MCV 88.3 09/03/2012   PLT  115* 09/03/2012      Chemistry      Component Value Date/Time   NA 134* 08/27/2012 1143   NA 140 07/19/2012 1538   K 3.6 08/27/2012 1143   K 3.7 07/19/2012 1538   CL 99 08/27/2012 1143   CL 103 07/19/2012 1538   CO2 25 08/27/2012 1143   CO2 27 07/19/2012 1538   BUN 15.0 08/27/2012 1143   BUN 15 07/19/2012 1538   CREATININE 0.8 08/27/2012 1143   CREATININE 0.77 07/19/2012 1538      Component Value Date/Time   CALCIUM 9.2 08/27/2012 1143   CALCIUM 9.3 07/19/2012 1538   ALKPHOS 104 08/27/2012 1143   ALKPHOS 60 07/19/2012 1538   AST 11 08/27/2012 1143   AST 17 07/19/2012 1538   ALT 18  08/27/2012 1143   ALT 16 07/19/2012 1538   BILITOT 0.80 08/27/2012 1143   BILITOT 0.2* 07/19/2012 1538       RADIOGRAPHIC STUDIES:  ASSESSMENT: 66 year old female with  #1 inflammatory right breast cancer with positive lymph nodes the tumor is ER negative PR negative HER-2/neu positive. Patient is  to receive neoadjuvant chemotherapy intially Consisting of FEC q 2 weeks x 4 cycles. Then she will receive taxol and herceptin q week x 12 weeks.Once patient completes her chemotherapy then we will get restaging MRIs performed to evaluate response to therapy. Patient does understand that she will most likely need a mastectomy because she does have inflammatory breast cancer.   PLAN:  #1 Jessica Wells is feeling well today.  She will receive her last cycle of FEC today.  We discussed her scheduling and her next treatment.  She received education regarding Taxol and Herceptin on her AVS.    #2 We will see her next week.  On 10/18 she will begin weekly Taxol/Herceptin for 12 weeks.    All questions were answered. The patient knows to call the clinic with any problems, questions or concerns. We can certainly see the patient much sooner if necessary.  I spent 25 minutes counseling the patient face to face. The total time spent in the appointment was 30 minutes.   Cherie Ouch Lyn Hollingshead, NP Medical Oncology [facility] Phone: [phone]  ATTENDING'S ATTESTATION:  I personally reviewed patient's chart, examined patient myself, formulated the treatment plan as above  Drue Second, MD Medical/Oncology [facility] [phone] (beeper) [phone] (Office)  09/03/2012, 12:58 PM    ATTENDING'S ATTESTATION:  I personally reviewed patient's chart, examined patient myself, formulated the treatment plan as above.  Drue Second, MD Medical/Oncology [facility] [phone] (beeper) [phone] (Office)

## 2012-09-03 NOTE — Telephone Encounter (Signed)
Per staff message and POF I have scheduled appts.  JMW  

## 2012-09-03 NOTE — Patient Instructions (Addendum)
You are doing well.  Proceed with chemotherapy today.  We will see you back on 10/11.  Please call us if you have any questions or concerns.    Paclitaxel injection What is this medicine? PACLITAXEL (PAK li TAX el) is a chemotherapy drug. It targets fast dividing cells, like cancer cells, and causes these cells to die. This medicine is used to treat ovarian cancer, breast cancer, and other cancers. This medicine may be used for other purposes; ask your health care provider or pharmacist if you have questions. What should I tell my health care provider before I take this medicine? They need to know if you have any of these conditions: -blood disorders -irregular heartbeat -infection (especially a virus infection such as chickenpox, cold sores, or herpes) -liver disease -previous or ongoing radiation therapy -an unusual or allergic reaction to paclitaxel, alcohol, polyoxyethylated castor oil, other chemotherapy agents, other medicines, foods, dyes, or preservatives -pregnant or trying to get pregnant -breast-feeding How should I use this medicine? This drug is given as an infusion into a vein. It is administered in a hospital or clinic by a specially trained health care professional. Talk to your pediatrician regarding the use of this medicine in children. Special care may be needed. Overdosage: If you think you have taken too much of this medicine contact a poison control center or emergency room at once. NOTE: This medicine is only for you. Do not share this medicine with others. What if I miss a dose? It is important not to miss your dose. Call your doctor or health care professional if you are unable to keep an appointment. What may interact with this medicine? Do not take this medicine with any of the following medications: -disulfiram -metronidazole This medicine may also interact with the following medications: -cyclosporine -dexamethasone -diazepam -ketoconazole -medicines to  increase blood counts like filgrastim, pegfilgrastim, sargramostim -other chemotherapy drugs like cisplatin, doxorubicin, epirubicin, etoposide, teniposide, vincristine -quinidine -testosterone -vaccines -verapamil Talk to your doctor or health care professional before taking any of these medicines: -acetaminophen -aspirin -ibuprofen -ketoprofen -naproxen This list may not describe all possible interactions. Give your health care provider a list of all the medicines, herbs, non-prescription drugs, or dietary supplements you use. Also tell them if you smoke, drink alcohol, or use illegal drugs. Some items may interact with your medicine. What should I watch for while using this medicine? Your condition will be monitored carefully while you are receiving this medicine. You will need important blood work done while you are taking this medicine. This drug may make you feel generally unwell. This is not uncommon, as chemotherapy can affect healthy cells as well as cancer cells. Report any side effects. Continue your course of treatment even though you feel ill unless your doctor tells you to stop. In some cases, you may be given additional medicines to help with side effects. Follow all directions for their use. Call your doctor or health care professional for advice if you get a fever, chills or sore throat, or other symptoms of a cold or flu. Do not treat yourself. This drug decreases your body's ability to fight infections. Try to avoid being around people who are sick. This medicine may increase your risk to bruise or bleed. Call your doctor or health care professional if you notice any unusual bleeding. Be careful brushing and flossing your teeth or using a toothpick because you may get an infection or bleed more easily. If you have any dental work done, tell your dentist  you are receiving this medicine. Avoid taking products that contain aspirin, acetaminophen, ibuprofen, naproxen, or ketoprofen  unless instructed by your doctor. These medicines may hide a fever. Do not become pregnant while taking this medicine. Women should inform their doctor if they wish to become pregnant or think they might be pregnant. There is a potential for serious side effects to an unborn child. Talk to your health care professional or pharmacist for more information. Do not breast-feed an infant while taking this medicine. Men are advised not to father a child while receiving this medicine. What side effects may I notice from receiving this medicine? Side effects that you should report to your doctor or health care professional as soon as possible: -allergic reactions like skin rash, itching or hives, swelling of the face, lips, or tongue -low blood counts - This drug may decrease the number of white blood cells, red blood cells and platelets. You may be at increased risk for infections and bleeding. -signs of infection - fever or chills, cough, sore throat, pain or difficulty passing urine -signs of decreased platelets or bleeding - bruising, pinpoint red spots on the skin, black, tarry stools, nosebleeds -signs of decreased red blood cells - unusually weak or tired, fainting spells, lightheadedness -breathing problems -chest pain -high or low blood pressure -mouth sores -nausea and vomiting -pain, swelling, redness or irritation at the injection site -pain, tingling, numbness in the hands or feet -slow or irregular heartbeat -swelling of the ankle, feet, hands Side effects that usually do not require medical attention (report to your doctor or health care professional if they continue or are bothersome): -bone pain -complete hair loss including hair on your head, underarms, pubic hair, eyebrows, and eyelashes -changes in the color of fingernails -diarrhea -loosening of the fingernails -loss of appetite -muscle or joint pain -red flush to skin -sweating This list may not describe all possible side  effects. Call your doctor for medical advice about side effects. You may report side effects to FDA at 1-800-FDA-1088. Where should I keep my medicine? This drug is given in a hospital or clinic and will not be stored at home. NOTE: This sheet is a summary. It may not cover all possible information. If you have questions about this medicine, talk to your doctor, pharmacist, or health care provider.  2012, Elsevier/Gold Standard. (10/30/2008 11:54:26 AM)  Trastuzumab injection for infusion What is this medicine? TRASTUZUMAB (tras TOO zoo mab) is a monoclonal antibody. It targets a protein called HER2. This protein is found in some stomach and breast cancers. This medicine can stop cancer cell growth. This medicine may be used with other cancer treatments. This medicine may be used for other purposes; ask your health care provider or pharmacist if you have questions. What should I tell my health care provider before I take this medicine? They need to know if you have any of these conditions: -heart disease -heart failure -infection (especially a virus infection such as chickenpox, cold sores, or herpes) -lung or breathing disease, like asthma -recent or ongoing radiation therapy -an unusual or allergic reaction to trastuzumab, benzyl alcohol, or other medications, foods, dyes, or preservatives -pregnant or trying to get pregnant -breast-feeding How should I use this medicine? This drug is given as an infusion into a vein. It is administered in a hospital or clinic by a specially trained health care professional. Talk to your pediatrician regarding the use of this medicine in children. This medicine is not approved for use in children. Overdosage:  If you think you have taken too much of this medicine contact a poison control center or emergency room at once. NOTE: This medicine is only for you. Do not share this medicine with others. What if I miss a dose? It is important not to miss a dose.  Call your doctor or health care professional if you are unable to keep an appointment. What may interact with this medicine? -cyclophosphamide -doxorubicin -warfarin This list may not describe all possible interactions. Give your health care provider a list of all the medicines, herbs, non-prescription drugs, or dietary supplements you use. Also tell them if you smoke, drink alcohol, or use illegal drugs. Some items may interact with your medicine. What should I watch for while using this medicine? Visit your doctor for checks on your progress. Report any side effects. Continue your course of treatment even though you feel ill unless your doctor tells you to stop. Call your doctor or health care professional for advice if you get a fever, chills or sore throat, or other symptoms of a cold or flu. Do not treat yourself. Try to avoid being around people who are sick. You may experience fever, chills and shaking during your first infusion. These effects are usually mild and can be treated with other medicines. Report any side effects during the infusion to your health care professional. Fever and chills usually do not happen with later infusions. What side effects may I notice from receiving this medicine? Side effects that you should report to your doctor or other health care professional as soon as possible: -breathing difficulties -chest pain or palpitations -cough -dizziness or fainting -fever or chills, sore throat -skin rash, itching or hives -swelling of the legs or ankles -unusually weak or tired Side effects that usually do not require medical attention (report to your doctor or other health care professional if they continue or are bothersome): -loss of appetite -headache -muscle aches -nausea This list may not describe all possible side effects. Call your doctor for medical advice about side effects. You may report side effects to FDA at 1-800-FDA-1088. Where should I keep my  medicine? This drug is given in a hospital or clinic and will not be stored at home. NOTE: This sheet is a summary. It may not cover all possible information. If you have questions about this medicine, talk to your doctor, pharmacist, or health care provider.  2012, Elsevier/Gold Standard. (09/21/2009 1:43:15 PM)

## 2012-09-03 NOTE — Progress Notes (Signed)
Ellence pushed through peripheral IV.  Good blood return noted before, during (every 3-5cc) and after IV push.  Pt tolerated well.

## 2012-09-03 NOTE — Telephone Encounter (Signed)
Per orders made patient appointments for 10/11, 10/18, 10/25, 11/1, 11/8, 11/15, 11/22, 11/29, 12/6, 12/13, 12/20, 12/27, 1/3  Sent michelle email to set up patient's treatments cooresponding with the above dates

## 2012-09-03 NOTE — Patient Instructions (Signed)
[facility] Discharge Instructions for Patients Receiving Chemotherapy  Today you received the following chemotherapy agents 5 FU/ Ellence/Cytoxan To help prevent nausea and vomiting after your treatment, we encourage you to take your nausea medication as prescribed.  If you develop nausea and vomiting that is not controlled by your nausea medication, call the clinic. If it is after clinic hours your family physician or the after hours number for the clinic or go to the Emergency Department.   BELOW ARE SYMPTOMS THAT SHOULD BE REPORTED IMMEDIATELY:  *FEVER GREATER THAN 100.5 F  *CHILLS WITH OR WITHOUT FEVER  NAUSEA AND VOMITING THAT IS NOT CONTROLLED WITH YOUR NAUSEA MEDICATION  *UNUSUAL SHORTNESS OF BREATH  *UNUSUAL BRUISING OR BLEEDING  TENDERNESS IN MOUTH AND THROAT WITH OR WITHOUT PRESENCE OF ULCERS  *URINARY PROBLEMS  *BOWEL PROBLEMS  UNUSUAL RASH Items with * indicate a potential emergency and should be followed up as soon as possible.  One of the nurses will contact you 24 hours after your treatment. Please let the nurse know about any problems that you may have experienced. Feel free to call the clinic you have any questions or concerns. The clinic phone number is [phone].   I have been informed and understand all the instructions given to me. I know to contact the clinic, my physician, or go to the Emergency Department if any problems should occur. I do not have any questions at this time, but understand that I may call the clinic during office hours   should I have any questions or need assistance in obtaining follow up care.    __________________________________________  _____________  __________ Signature of Patient or Authorized Representative            Date                   Time    __________________________________________ Nurse's Signature    

## 2012-09-03 NOTE — Progress Notes (Signed)
1155  Port accessed, flushed easily, no blood return noted  1200  Administered cath flo.  1230  No blood return  1300  No blood return  1330  No blood return  1400  No blood return. Dr. Welton Flakes notified.  1445  Port de accessed.  Proceed with peripheral FEC and get Dye study on Monday per Dr. Welton Flakes.

## 2012-09-04 ENCOUNTER — Ambulatory Visit (HOSPITAL_BASED_OUTPATIENT_CLINIC_OR_DEPARTMENT_OTHER): Payer: Medicare Other

## 2012-09-04 VITALS — BP 136/76 | HR 96 | Temp 98.1°F | Resp 18

## 2012-09-04 DIAGNOSIS — C50119 Malignant neoplasm of central portion of unspecified female breast: Secondary | ICD-10-CM

## 2012-09-04 MED ORDER — PEGFILGRASTIM INJECTION 6 MG/0.6ML
6.0000 mg | Freq: Once | SUBCUTANEOUS | Status: AC
  Administered 2012-09-04: 6 mg via SUBCUTANEOUS

## 2012-09-04 NOTE — Patient Instructions (Addendum)
[facility] Discharge Instructions for Patients Receiving Chemotherapy  Today you received the following chemotherapy agents Neulasta.  To help prevent nausea and vomiting after your treatment, we encourage you to take your nausea medication as prescribed.   If you develop nausea and vomiting that is not controlled by your nausea medication, call the clinic. If it is after clinic hours your family physician or the after hours number for the clinic or go to the Emergency Department.   BELOW ARE SYMPTOMS THAT SHOULD BE REPORTED IMMEDIATELY:  *FEVER GREATER THAN 100.5 F  *CHILLS WITH OR WITHOUT FEVER  NAUSEA AND VOMITING THAT IS NOT CONTROLLED WITH YOUR NAUSEA MEDICATION  *UNUSUAL SHORTNESS OF BREATH  *UNUSUAL BRUISING OR BLEEDING  TENDERNESS IN MOUTH AND THROAT WITH OR WITHOUT PRESENCE OF ULCERS  *URINARY PROBLEMS  *BOWEL PROBLEMS  UNUSUAL RASH Items with * indicate a potential emergency and should be followed up as soon as possible.  One of the nurses will contact you 24 hours after your treatment. Please let the nurse know about any problems that you may have experienced. Feel free to call the clinic you have any questions or concerns. The clinic phone number is [phone].   I have been informed and understand all the instructions given to me. I know to contact the clinic, my physician, or go to the Emergency Department if any problems should occur. I do not have any questions at this time, but understand that I may call the clinic during office hours   should I have any questions or need assistance in obtaining follow up care.    __________________________________________  _____________  __________ Signature of Patient or Authorized Representative            Date                   Time    __________________________________________ Nurse's Signature    

## 2012-09-06 ENCOUNTER — Ambulatory Visit (HOSPITAL_COMMUNITY)
Admission: RE | Admit: 2012-09-06 | Discharge: 2012-09-06 | Disposition: A | Discharge status: Home / Self Care | Payer: Medicare Other | Source: Ambulatory Visit | Attending: Oncology | Admitting: Oncology

## 2012-09-06 ENCOUNTER — Other Ambulatory Visit: Payer: Self-pay | Admitting: Oncology

## 2012-09-06 DIAGNOSIS — C50119 Malignant neoplasm of central portion of unspecified female breast: Secondary | ICD-10-CM

## 2012-09-06 DIAGNOSIS — C50919 Malignant neoplasm of unspecified site of unspecified female breast: Secondary | ICD-10-CM | POA: Diagnosis not present

## 2012-09-06 DIAGNOSIS — T82598A Other mechanical complication of other cardiac and vascular devices and implants, initial encounter: Secondary | ICD-10-CM | POA: Diagnosis not present

## 2012-09-06 DIAGNOSIS — T82898A Other specified complication of vascular prosthetic devices, implants and grafts, initial encounter: Secondary | ICD-10-CM | POA: Diagnosis not present

## 2012-09-06 DIAGNOSIS — Y831 Surgical operation with implant of artificial internal device as the cause of abnormal reaction of the patient, or of later complication, without mention of misadventure at the time of the procedure: Secondary | ICD-10-CM | POA: Insufficient documentation

## 2012-09-06 MED ORDER — ALTEPLASE 100 MG IV SOLR
5.0000 mg | Freq: Once | INTRAVENOUS | Status: AC
  Administered 2012-09-06: 5 mg via INTRAVENOUS
  Filled 2012-09-06: qty 5

## 2012-09-06 MED ORDER — HEPARIN SOD (PORK) LOCK FLUSH 100 UNIT/ML IV SOLN
500.0000 [IU] | Freq: Once | INTRAVENOUS | Status: AC
  Administered 2012-09-06: 500 [IU]

## 2012-09-10 ENCOUNTER — Other Ambulatory Visit (HOSPITAL_BASED_OUTPATIENT_CLINIC_OR_DEPARTMENT_OTHER): Payer: Medicare Other | Admitting: Lab

## 2012-09-10 ENCOUNTER — Encounter: Payer: Self-pay | Admitting: Adult Health

## 2012-09-10 ENCOUNTER — Telehealth: Payer: Self-pay | Admitting: *Deleted

## 2012-09-10 ENCOUNTER — Ambulatory Visit (HOSPITAL_BASED_OUTPATIENT_CLINIC_OR_DEPARTMENT_OTHER): Payer: Medicare Other | Admitting: Adult Health

## 2012-09-10 VITALS — BP 118/75 | HR 105 | Temp 99.1°F | Resp 20 | Ht 61.0 in | Wt 136.1 lb

## 2012-09-10 DIAGNOSIS — C773 Secondary and unspecified malignant neoplasm of axilla and upper limb lymph nodes: Secondary | ICD-10-CM | POA: Diagnosis not present

## 2012-09-10 DIAGNOSIS — D709 Neutropenia, unspecified: Secondary | ICD-10-CM

## 2012-09-10 DIAGNOSIS — Z171 Estrogen receptor negative status [ER-]: Secondary | ICD-10-CM | POA: Diagnosis not present

## 2012-09-10 DIAGNOSIS — C50119 Malignant neoplasm of central portion of unspecified female breast: Secondary | ICD-10-CM

## 2012-09-10 LAB — CBC WITH DIFFERENTIAL/PLATELET
Eosinophils Absolute: 0 10*3/uL (ref 0.0–0.5)
HGB: 10.5 g/dL — ABNORMAL LOW (ref 11.6–15.9)
MONO#: 0 10*3/uL — ABNORMAL LOW (ref 0.1–0.9)
MONO%: 2.4 % (ref 0.0–14.0)
NEUT#: 0.4 10*3/uL — CL (ref 1.5–6.5)
RBC: 3.5 10*6/uL — ABNORMAL LOW (ref 3.70–5.45)
RDW: 16.5 % — ABNORMAL HIGH (ref 11.2–14.5)
WBC: 0.8 10*3/uL — CL (ref 3.9–10.3)
lymph#: 0.4 10*3/uL — ABNORMAL LOW (ref 0.9–3.3)
nRBC: 0 % (ref 0–0)

## 2012-09-10 LAB — COMPREHENSIVE METABOLIC PANEL
ALT: 16 U/L (ref 0–35)
Albumin: 4 g/dL (ref 3.5–5.2)
CO2: 27 mEq/L (ref 19–32)
Chloride: 99 mEq/L (ref 96–112)
Potassium: 4 mEq/L (ref 3.5–5.3)
Sodium: 135 mEq/L (ref 135–145)
Total Bilirubin: 0.9 mg/dL (ref 0.3–1.2)
Total Protein: 6 g/dL (ref 6.0–8.3)

## 2012-09-10 NOTE — Progress Notes (Signed)
OFFICE PROGRESS NOTE  CC  [name], MD [address] [place] [address]  DIAGNOSIS: 66 year old female with new diagnosis of inflammatory breast cancer  PRIOR THERAPY: #1 patient was originally seen in my clinic for new diagnosis of inflammatory breast cancer she was referred by Dr. Emelia Loron. She had a mammogram performed That showed an abnormality. That was biopsied and it showed an invasive mammary carcinoma with lymphovascular invasion grade 3 ER negative PR negative HER-2/neu positive.  #2 patient has gone on to have MRI of the breasts performed the MRI shows diffuse right breast neoplasm on a large level I right axillary lymph nodes compatible with lymphatic spread.Patient has had a biopsy of the right axillary lymph node that is compatible with invasive mammary carcinoma.  #3 patient  began FEC 100 with day 2 Neulasta on 07/23/2012  CURRENT THERAPY: Cycle 4 day 8 FEC  INTERVAL HISTORY: Jessica Wells 66 y.o. female returns for Followup visit . She received her fourth and final cycle of FEC last week and is feeling the effects.  She is fatigued, and is having mouth pain with cracking of the lips in the corners of her mouth.  She hasn't had a fever, chills or any other sign of infection.  She had a dye study of her port, and would like her port to be revised soon since it has been so difficult to access recently.  Otherwise she is feeling well and w/o complaints.    MEDICAL HISTORY: Past Medical History  Diagnosis Date  . Asthma   . GERD (gastroesophageal reflux disease)   . PONV (postoperative nausea and vomiting)     severe  . Dysrhythmia     PAT-sees dr Laurence Compton meds  . Breast cancer     inflammatory right breast ca  . Allergy   . PAT (paroxysmal atrial tachycardia)     hx  . Arthritis     osteopenia,knees    ALLERGIES:  is allergic to anesthetics, amide; bactrim; sulfa antibiotics; codeine; codeine phosphate; and eggs or egg-derived  products.  MEDICATIONS:  Current Outpatient Prescriptions  Medication Sig Dispense Refill  . albuterol (PROVENTIL HFA;VENTOLIN HFA) 108 (90 BASE) MCG/ACT inhaler Inhale 2 puffs into the lungs every 6 (six) hours as needed.      Marland Kitchen aspirin EC 81 MG tablet Take 162 mg by mouth every 6 (six) hours as needed. For blood clot symptoms      . calcium-vitamin D (OSCAL WITH D) 500-200 MG-UNIT per tablet Take 1 tablet by mouth daily.      Marland Kitchen dexamethasone (DECADRON) 4 MG tablet Take 2 tablets by mouth once a day on the day after chemotherapy and then take 2 tablets two times a day for 2 days. Take with food.  30 tablet  1  . fish oil-omega-3 fatty acids 1000 MG capsule Take 2 g by mouth daily.      . Flaxseed, Linseed, (FLAX SEEDS PO) Take 1 tablet by mouth daily.      . fluticasone (FLONASE) 50 MCG/ACT nasal spray Place 1 spray into the nose daily.      Marland Kitchen glucosamine-chondroitin 500-400 MG tablet Take 1 tablet by mouth daily.      Marland Kitchen HYDROcodone-acetaminophen (NORCO/VICODIN) 5-325 MG per tablet       . lidocaine-prilocaine (EMLA) cream Apply topically as needed.  30 g  5  . LORazepam (ATIVAN) 0.5 MG tablet Take 1 tablet (0.5 mg total) by mouth every 6 (six) hours as needed (Nausea or vomiting).  30 tablet  0  . Multiple Vitamin (MULTIVITAMIN WITH MINERALS) TABS Take 1 tablet by mouth daily.      Marland Kitchen omeprazole (PRILOSEC) 20 MG capsule Take 20 mg by mouth daily.      . ondansetron (ZOFRAN) 8 MG tablet Take 1 tablet two times a day as needed for nausea or vomiting starting on the third day after chemotherapy.  30 tablet  1  . ondansetron (ZOFRAN-ODT) 8 MG disintegrating tablet       . oxyCODONE-acetaminophen (PERCOCET/ROXICET) 5-325 MG per tablet       . prochlorperazine (COMPAZINE) 10 MG tablet Take 1 tablet (10 mg total) by mouth every 6 (six) hours as needed (Nausea or vomiting).  30 tablet  1  . prochlorperazine (COMPAZINE) 25 MG suppository Place 1 suppository (25 mg total) rectally every 12 (twelve)  hours as needed for nausea.  12 suppository  3  . valACYclovir (VALTREX) 500 MG tablet Take 1 tablet (500 mg total) by mouth 3 (three) times daily.  90 tablet  6    SURGICAL HISTORY:  Past Surgical History  Procedure Date  . Dilation and curettage of uterus     x3  . Tonsillectomy   . Colonoscopy   . Portacath placement 07/21/2012    Procedure: INSERTION PORT-A-CATH;  Surgeon: Emelia Loron, MD;  Location: [facility];  Service: General;  Laterality: Left;    REVIEW OF SYSTEMS:   General: fatigue (+), night sweats (-), fever (-), pain (-) Lymph: palpable nodes (-) HEENT: vision changes (-), mucositis (-), gum bleeding (-), epistaxis (-) Cardiovascular: chest pain (-), palpitations (-) Pulmonary: shortness of breath (-), dyspnea (-), cough (-), hemoptysis (-) GI:  Early satiety (-), melena (-), dysphagia (-), nausea/vomiting (-), diarrhea (-) GU: dysuria (-), hematuria (-), incontinence (-) Musculoskeletal: joint swelling (-), joint pain (-), back pain (-) Neuro: weakness (-), numbness (-), headache (-), confusion (-) Skin: Rash (-), lesions (-), dryness (-) Psych: depression (-), suicidal/homicidal ideation (-), feeling of hopelessness (-)   PHYSICAL EXAMINATION:  BP 118/75  Pulse 105  Temp 99.1 F (37.3 C) (Oral)  Resp 20  Ht 5\' 1"  (1.549 m)  Wt 136 lb 1.6 oz (61.735 kg)  BMI 25.72 kg/m2 General: Patient is a well appearing female in no acute distress HEENT: PERRLA, sclerae anicteric no conjunctival pallor, MMM Neck: supple, no palpable adenopathy Lungs: clear to auscultation bilaterally, no wheezes, rhonchi, or rales Cardiovascular: regular rate rhythm, S1, S2, no murmurs, rubs or gallops Abdomen: Soft, non-tender, non-distended, normoactive bowel sounds, no HSM Extremities: warm and well perfused, no clubbing, cyanosis, or edema Skin: No rashes or lesions Neuro: Non-focal Right breast reveals grossly enlarged breast with Dimpling of the skin And  retraction of the nipple ECOG PERFORMANCE STATUS: 0 - Asymptomatic   LABORATORY DATA: Lab Results  Component Value Date   WBC 0.8* 09/10/2012   HGB 10.5* 09/10/2012   HCT 30.5* 09/10/2012   MCV 87.1 09/10/2012   PLT 49* 09/10/2012  ANC 400    Chemistry      Component Value Date/Time   NA 140 09/03/2012 1003   NA 140 07/19/2012 1538   K 3.6 09/03/2012 1003   K 3.7 07/19/2012 1538   CL 105 09/03/2012 1003   CL 103 07/19/2012 1538   CO2 24 09/03/2012 1003   CO2 27 07/19/2012 1538   BUN 11.0 09/03/2012 1003   BUN 15 07/19/2012 1538   CREATININE 0.8 09/03/2012 1003   CREATININE 0.77 07/19/2012 1538  Component Value Date/Time   CALCIUM 8.9 09/03/2012 1003   CALCIUM 9.3 07/19/2012 1538   ALKPHOS 102 09/03/2012 1003   ALKPHOS 60 07/19/2012 1538   AST 20 09/03/2012 1003   AST 17 07/19/2012 1538   ALT 24 09/03/2012 1003   ALT 16 07/19/2012 1538   BILITOT 0.40 09/03/2012 1003   BILITOT 0.2* 07/19/2012 1538       RADIOGRAPHIC STUDIES:  ASSESSMENT: 66 year old female with  #1 inflammatory right breast cancer with positive lymph nodes the tumor is ER negative PR negative HER-2/neu positive. Patient is  to receive neoadjuvant chemotherapy intially Consisting of FEC q 2 weeks x 4 cycles. Then she will receive taxol and herceptin q week x 12 weeks.Once patient completes her chemotherapy then we will get restaging MRIs performed to evaluate response to therapy. Patient does understand that she will most likely need a mastectomy because she does have inflammatory breast cancer.  #2 Neutropenia   PLAN:  #1 Jessica Wells is did well with her chemotherapy.  She is subsequently Neutropenic.  I reviewed neutropenic precautions with her, and she will pick up her Cipro on the way home and start taking it tonight.    #2 We will see her next week and begin weekly Taxol/Herceptin for 12 weeks.    All questions were answered. The patient knows to call the clinic with any problems, questions or concerns. We  can certainly see the patient much sooner if necessary.  I spent 25 minutes counseling the patient face to face. The total time spent in the appointment was 30 minutes.   Jessica Ouch Lyn Hollingshead, NP Medical On[facility] Phone: [phone]

## 2012-09-10 NOTE — Telephone Encounter (Signed)
Add on 10-15-2012 lab and np

## 2012-09-10 NOTE — Patient Instructions (Addendum)
  Patient Neutropenia Instruction Sheet  Diagnosis: Breast Cancer      Treating Physician: Drue Second, MD  Treatment: 1. Type of chemotherapy: FEC 2. Date of last treatment: 09/03/12  Last Blood Counts: Lab Results  Component Value Date   WBC 0.8* 09/10/2012   HGB 10.5* 09/10/2012   HCT 30.5* 09/10/2012   MCV 87.1 09/10/2012   PLT 49* 09/10/2012   ANC 400      Prophylactic Antibiotics: Cipro 500 mg by mouth twice a day Instructions: 1. Monitor temperature and call if fever  greater than 100.5, chills, shaking chills (rigors) 2. Call Physician on-call at [phone] 3. Give him/her symptoms and list of medications that you are taking and your last blood count.   Doing well.  Will call you once we speak with Dr. Dwain Sarna about the port.  Please call us if you have any questions or concerns.

## 2012-09-10 NOTE — Telephone Encounter (Signed)
Per staff message and POF I have schedueld apapts. JMw

## 2012-09-13 ENCOUNTER — Telehealth (INDEPENDENT_AMBULATORY_CARE_PROVIDER_SITE_OTHER): Payer: Self-pay | Admitting: General Surgery

## 2012-09-13 ENCOUNTER — Other Ambulatory Visit (INDEPENDENT_AMBULATORY_CARE_PROVIDER_SITE_OTHER): Payer: Self-pay | Admitting: General Surgery

## 2012-09-13 ENCOUNTER — Other Ambulatory Visit: Payer: Medicare Other

## 2012-09-13 ENCOUNTER — Encounter: Payer: Self-pay | Admitting: Genetic Counselor

## 2012-09-13 ENCOUNTER — Ambulatory Visit (HOSPITAL_BASED_OUTPATIENT_CLINIC_OR_DEPARTMENT_OTHER): Payer: Medicare Other | Admitting: Genetic Counselor

## 2012-09-13 ENCOUNTER — Encounter (HOSPITAL_COMMUNITY): Payer: Self-pay | Admitting: *Deleted

## 2012-09-13 DIAGNOSIS — C773 Secondary and unspecified malignant neoplasm of axilla and upper limb lymph nodes: Secondary | ICD-10-CM

## 2012-09-13 DIAGNOSIS — IMO0002 Reserved for concepts with insufficient information to code with codable children: Secondary | ICD-10-CM | POA: Diagnosis not present

## 2012-09-13 DIAGNOSIS — R112 Nausea with vomiting, unspecified: Secondary | ICD-10-CM | POA: Insufficient documentation

## 2012-09-13 DIAGNOSIS — Z9889 Other specified postprocedural states: Secondary | ICD-10-CM

## 2012-09-13 DIAGNOSIS — C50119 Malignant neoplasm of central portion of unspecified female breast: Secondary | ICD-10-CM | POA: Diagnosis not present

## 2012-09-13 HISTORY — DX: Other specified postprocedural states: Z98.890

## 2012-09-13 HISTORY — DX: Nausea with vomiting, unspecified: R11.2

## 2012-09-13 NOTE — Telephone Encounter (Signed)
Message copied by Littie Deeds on Mon Sep 13, 2012 11:26 AM ------      Message from: [place], [place]      Created: Mon Sep 13, 2012 11:12 AM       Penni Bombard      Dr Welton Flakes sent me a message and said it wasn't working.  I am pretty sure patient knows and I will call her tomorrow.  Would you please call her and give her a heads up that we will revise this week.  I will put orders and posting sheet in and route to you.      MW

## 2012-09-13 NOTE — Telephone Encounter (Signed)
Spoke with patient and informed her that we are going to have to work on her PAC due to it not working.  Informed her that our surgery schedulers will be calling her shortly to discuss this with her.  She said she was fine with this.

## 2012-09-13 NOTE — Progress Notes (Signed)
09-13-12 1530 Instructed as SDS labs. Instructed on Hibiclens soap, will have responsible driver and person X 24 hrs once discharged home. W. Kennon Portela

## 2012-09-13 NOTE — Progress Notes (Signed)
Dr. Drue Second requested a consultation for genetic counseling and risk assessment for Jessica Wells, a 66 y.o. female, for discussion of her personal history of inflammatory breast cancer and family history of lobular breast cancer. She presents to clinic today to discuss the possibility of a genetic predisposition to cancer, and to further clarify her risks, as well as her family members' risks for cancer.   HISTORY OF PRESENT ILLNESS: In 2013, at the age of 84, Jessica Wells was diagnosed with inflammatory breast cancer. This will be treated with surgery and radiation.    Past Medical History  Diagnosis Date  . Asthma   . GERD (gastroesophageal reflux disease)   . Dysrhythmia     PAT-sees dr Laurence Compton meds  . Breast cancer     inflammatory right breast ca  . Allergy   . PAT (paroxysmal atrial tachycardia)     hx  . Arthritis     osteopenia,knees  . PONV (postoperative nausea and vomiting) 09-13-12    severe, with Port-a-cath, was managed without PONV    Past Surgical History  Procedure Date  . Dilation and curettage of uterus     x3  . Tonsillectomy   . Colonoscopy   . Portacath placement 07/21/2012    Procedure: INSERTION PORT-A-CATH;  Surgeon: Emelia Loron, MD;  Location:  [facility];  Service: General;  Laterality: Left;    History  Substance Use Topics  . Smoking status: Former Smoker -- 35 years    Quit date: 07/21/1979  . Smokeless tobacco: Never Used  . Alcohol Use: No    REPRODUCTIVE HISTORY AND PERSONAL RISK ASSESSMENT FACTORS: Menarche was at age 39-13.   Menopause around age 68 Uterus Intact: Yes Ovaries Intact: Yes G1P1A0 , first live birth at age 32  She has not previously undergone treatment for infertility.   OCP use for 2-3 years   She has used HRT in the past.    FAMILY HISTORY:  We obtained a detailed, 4-generation family history.  Significant diagnoses are listed below: Family History  Problem Relation Age of Onset  .  Diabetes Mother   . Heart disease Mother   . Hypertension Mother   . Hyperlipidemia Mother   . Breast cancer Mother 71    lobular breast cancer  . Heart disease Maternal Aunt   . Heart disease Maternal Uncle   . Heart disease Maternal Grandmother     died in her 70s from heart disease  . Lung cancer Father 53    adenocarcinoma  . Cancer Paternal Uncle     dx in mid 45s with a cancer in the leg, died late 44s; grandpaternal half uncle  The patient was diagnosed with inflammatory breast cancer at age 89.  Her mother was diagnosed with lobular breast cancer at age 56 and her father was diagnosed with an adenocarcinoma of the lung at age 67.  Her father's paternal half brother was diagnosed with a cancer in his leg in his mid 1s and died in his late 43s.  There is no other reported cancer history on either side of the family.  Patient's maternal ancestors are of [place] and [place] descent, and paternal ancestors are of [place] descent. There is no reported Ashkenazi Jewish ancestry. There is no  known consanguinity.  GENETIC COUNSELING RISK ASSESSMENT, DISCUSSION, AND SUGGESTED FOLLOW UP: We reviewed the natural history and genetic etiology of sporadic, familial and hereditary cancer syndromes.   About 5-10% of breast cancer is hereditary.  Of  this, about 85% is the result of a BRCA1 or BRCA2 mutation.  We reviewed the red flags of hereditary cancer syndromes and the dominant inheritance patterns.  Typically, inflammatory breast cancer, as well as lobular breast cancer is not associated with BRCA mutations.  Based on the patient's family history, she does not meet Medicare criteria for genetic testing.  We discussed that she could pay out of pocket for the test, if she is worried about her risk specifically for BRCA.  At this point, based on the family history she has give, there is not a particular genetic condition that is obvious.  The patient's personal history of inflammatory breast cancer is  suggestive of the following possible diagnosis: sporadic cancer  In order to estimate her chance of having a BRCA mutation, we used statistical models (Penn II) and laboratory data that take into account her personal medical history, family history and ancestry.  Because each model is different, there can be a lot of variability in the risks they give.  Therefore, these numbers must be considered a rough range and not a precise risk of having a BRCA mutation.  These models estimate that she has approximately a 4% chance of having a mutation. Based on this assessment of her family and personal history, genetic testing is no recommended.  After considering the risks, benefits, and limitations, the patient decided to consider her options and discuss her treatment options with her doctors before considering pursuing genetic testing out of pocket.   The patient was seen for a total of 60 minutes, greater than 50% of which was spent face-to-face counseling.  This plan is being carried out per Dr. Drue Second recommendations.  This note will also be sent to the referring provider via the electronic medical record. The patient will be supplied with a summary of this genetic counseling discussion as well as educational information on the discussed hereditary cancer syndromes following the conclusion of their visit.   Patient was discussed with Dr. Drue Second.  _______________________________________________________________________ For Office Staff:  Number of people involved in session: 3 Was an Intern/ student involved with case: yes

## 2012-09-14 ENCOUNTER — Encounter (HOSPITAL_COMMUNITY): Payer: Self-pay | Admitting: Pharmacy Technician

## 2012-09-15 ENCOUNTER — Ambulatory Visit (HOSPITAL_COMMUNITY): Payer: Medicare Other

## 2012-09-15 ENCOUNTER — Other Ambulatory Visit: Payer: Self-pay | Admitting: Adult Health

## 2012-09-15 ENCOUNTER — Encounter (HOSPITAL_COMMUNITY): Payer: Self-pay | Admitting: *Deleted

## 2012-09-15 ENCOUNTER — Encounter (HOSPITAL_COMMUNITY): Payer: Self-pay | Admitting: Certified Registered Nurse Anesthetist

## 2012-09-15 ENCOUNTER — Ambulatory Visit (HOSPITAL_COMMUNITY): Payer: Medicare Other | Admitting: Certified Registered Nurse Anesthetist

## 2012-09-15 ENCOUNTER — Encounter (HOSPITAL_COMMUNITY)
Admission: RE | Disposition: A | Discharge status: Home / Self Care | Payer: Self-pay | Source: Ambulatory Visit | Attending: General Surgery

## 2012-09-15 ENCOUNTER — Ambulatory Visit (HOSPITAL_COMMUNITY)
Admission: RE | Admit: 2012-09-15 | Discharge: 2012-09-15 | Disposition: A | Discharge status: Home / Self Care | Payer: Medicare Other | Source: Ambulatory Visit | Attending: General Surgery | Admitting: General Surgery

## 2012-09-15 DIAGNOSIS — M899 Disorder of bone, unspecified: Secondary | ICD-10-CM | POA: Insufficient documentation

## 2012-09-15 DIAGNOSIS — R599 Enlarged lymph nodes, unspecified: Secondary | ICD-10-CM | POA: Insufficient documentation

## 2012-09-15 DIAGNOSIS — T85698A Other mechanical complication of other specified internal prosthetic devices, implants and grafts, initial encounter: Secondary | ICD-10-CM | POA: Diagnosis not present

## 2012-09-15 DIAGNOSIS — J45909 Unspecified asthma, uncomplicated: Secondary | ICD-10-CM | POA: Diagnosis not present

## 2012-09-15 DIAGNOSIS — Z452 Encounter for adjustment and management of vascular access device: Secondary | ICD-10-CM | POA: Diagnosis not present

## 2012-09-15 DIAGNOSIS — K219 Gastro-esophageal reflux disease without esophagitis: Secondary | ICD-10-CM | POA: Insufficient documentation

## 2012-09-15 DIAGNOSIS — D709 Neutropenia, unspecified: Secondary | ICD-10-CM | POA: Insufficient documentation

## 2012-09-15 DIAGNOSIS — C50119 Malignant neoplasm of central portion of unspecified female breast: Secondary | ICD-10-CM

## 2012-09-15 DIAGNOSIS — T82598A Other mechanical complication of other cardiac and vascular devices and implants, initial encounter: Secondary | ICD-10-CM | POA: Diagnosis not present

## 2012-09-15 DIAGNOSIS — Y838 Other surgical procedures as the cause of abnormal reaction of the patient, or of later complication, without mention of misadventure at the time of the procedure: Secondary | ICD-10-CM | POA: Insufficient documentation

## 2012-09-15 DIAGNOSIS — Z79899 Other long term (current) drug therapy: Secondary | ICD-10-CM | POA: Diagnosis not present

## 2012-09-15 DIAGNOSIS — C50919 Malignant neoplasm of unspecified site of unspecified female breast: Secondary | ICD-10-CM | POA: Insufficient documentation

## 2012-09-15 LAB — SURGICAL PCR SCREEN: MRSA, PCR: NEGATIVE

## 2012-09-15 LAB — CBC WITH DIFFERENTIAL/PLATELET
Basophils Absolute: 0.1 10*3/uL (ref 0.0–0.1)
Basophils Relative: 2 % — ABNORMAL HIGH (ref 0–1)
Eosinophils Relative: 0 % (ref 0–5)
HCT: 27.3 % — ABNORMAL LOW (ref 36.0–46.0)
MCHC: 35.9 g/dL (ref 30.0–36.0)
MCV: 86.1 fL (ref 78.0–100.0)
Monocytes Absolute: 0.6 10*3/uL (ref 0.1–1.0)
Neutro Abs: 3.6 10*3/uL (ref 1.7–7.7)
RDW: 17.2 % — ABNORMAL HIGH (ref 11.5–15.5)

## 2012-09-15 LAB — BASIC METABOLIC PANEL
BUN: 9 mg/dL (ref 6–23)
Chloride: 100 mEq/L (ref 96–112)
Creatinine, Ser: 0.66 mg/dL (ref 0.50–1.10)
GFR calc Af Amer: >90 mL/min (ref >90)

## 2012-09-15 SURGERY — REVISION, PORT-A-CATH INSERTION
Anesthesia: General | Site: Chest | Laterality: Left | Wound class: Clean

## 2012-09-15 MED ORDER — HEPARIN SOD (PORK) LOCK FLUSH 100 UNIT/ML IV SOLN
INTRAVENOUS | Status: AC
  Filled 2012-09-15: qty 10

## 2012-09-15 MED ORDER — FENTANYL CITRATE 0.05 MG/ML IJ SOLN: 25.0000 ug | INTRAMUSCULAR | Status: DC | PRN

## 2012-09-15 MED ORDER — MUPIROCIN 2 % EX OINT: TOPICAL_OINTMENT | Freq: Two times a day (BID) | CUTANEOUS | Status: DC

## 2012-09-15 MED ORDER — LACTATED RINGERS IV SOLN
INTRAVENOUS | Status: DC | PRN
  Administered 2012-09-15 (×2): via INTRAVENOUS

## 2012-09-15 MED ORDER — BUPIVACAINE HCL (PF) 0.5 % IJ SOLN
INTRAMUSCULAR | Status: AC
  Filled 2012-09-15: qty 30

## 2012-09-15 MED ORDER — ACETAMINOPHEN 10 MG/ML IV SOLN
INTRAVENOUS | Status: DC | PRN
  Administered 2012-09-15: 1000 mg via INTRAVENOUS

## 2012-09-15 MED ORDER — LACTATED RINGERS IV SOLN: INTRAVENOUS | Status: DC

## 2012-09-15 MED ORDER — MUPIROCIN 2 % EX OINT
TOPICAL_OINTMENT | CUTANEOUS | Status: AC
  Administered 2012-09-15: 1 via NASAL
  Filled 2012-09-15: qty 22

## 2012-09-15 MED ORDER — BUPIVACAINE-EPINEPHRINE PF 0.25-1:200000 % IJ SOLN
INTRAMUSCULAR | Status: AC
  Filled 2012-09-15: qty 30

## 2012-09-15 MED ORDER — SODIUM CHLORIDE 0.9 % IR SOLN
Status: DC | PRN
  Administered 2012-09-15: 17:00:00

## 2012-09-15 MED ORDER — OXYCODONE-ACETAMINOPHEN 5-325 MG PO TABS: 1.0000 | ORAL_TABLET | ORAL | Status: DC | PRN

## 2012-09-15 MED ORDER — PROPOFOL 10 MG/ML IV EMUL
INTRAVENOUS | Status: DC | PRN
  Administered 2012-09-15: 140 mg via INTRAVENOUS
  Administered 2012-09-15: 200 mg via INTRAVENOUS

## 2012-09-15 MED ORDER — SODIUM BICARBONATE 4 % IV SOLN
INTRAVENOUS | Status: AC
  Filled 2012-09-15: qty 5

## 2012-09-15 MED ORDER — PROPOFOL 10 MG/ML IV EMUL
INTRAVENOUS | Status: DC | PRN
  Administered 2012-09-15: 180 ug/kg/min via INTRAVENOUS

## 2012-09-15 MED ORDER — LACTATED RINGERS IV SOLN
INTRAVENOUS | Status: DC
  Administered 2012-09-15: 1000 mL via INTRAVENOUS

## 2012-09-15 MED ORDER — MIDAZOLAM HCL 5 MG/5ML IJ SOLN
INTRAMUSCULAR | Status: DC | PRN
  Administered 2012-09-15 (×2): 1 mg via INTRAVENOUS

## 2012-09-15 MED ORDER — CEFAZOLIN SODIUM-DEXTROSE 2-3 GM-% IV SOLR
2.0000 g | INTRAVENOUS | Status: AC
  Administered 2012-09-15: 2 g via INTRAVENOUS

## 2012-09-15 MED ORDER — FENTANYL CITRATE 0.05 MG/ML IJ SOLN
INTRAMUSCULAR | Status: DC | PRN
  Administered 2012-09-15 (×4): 50 ug via INTRAVENOUS

## 2012-09-15 MED ORDER — ONDANSETRON HCL 4 MG/2ML IJ SOLN
INTRAMUSCULAR | Status: DC | PRN
  Administered 2012-09-15 (×2): 2 mg via INTRAVENOUS

## 2012-09-15 MED ORDER — HEPARIN SOD (PORK) LOCK FLUSH 100 UNIT/ML IV SOLN
INTRAVENOUS | Status: DC | PRN
  Administered 2012-09-15: 100 [IU] via INTRAVENOUS

## 2012-09-15 MED ORDER — DEXAMETHASONE SODIUM PHOSPHATE 10 MG/ML IJ SOLN
INTRAMUSCULAR | Status: DC | PRN
  Administered 2012-09-15: 10 mg via INTRAVENOUS

## 2012-09-15 MED ORDER — BUPIVACAINE HCL (PF) 0.5 % IJ SOLN
INTRAMUSCULAR | Status: DC | PRN
  Administered 2012-09-15: 10 mL

## 2012-09-15 MED ORDER — EPHEDRINE SULFATE 50 MG/ML IJ SOLN
INTRAMUSCULAR | Status: DC | PRN
  Administered 2012-09-15 (×4): 5 mg via INTRAVENOUS

## 2012-09-15 MED ORDER — SODIUM CHLORIDE 0.9 % IR SOLN
Freq: Once | Status: DC
  Filled 2012-09-15: qty 1.2

## 2012-09-15 SURGICAL SUPPLY — 48 items
BAG DECANTER FOR FLEXI CONT (MISCELLANEOUS) ×2 IMPLANT
BENZOIN TINCTURE PRP APPL 2/3 (GAUZE/BANDAGES/DRESSINGS) ×2 IMPLANT
BLADE HEX COATED 2.75 (ELECTRODE) ×2 IMPLANT
BLADE SURG 15 STRL LF DISP TIS (BLADE) ×1 IMPLANT
BLADE SURG 15 STRL SS (BLADE) ×1
CLOTH BEACON ORANGE TIMEOUT ST (SAFETY) ×2 IMPLANT
DECANTER SPIKE VIAL GLASS SM (MISCELLANEOUS) ×2 IMPLANT
DERMABOND ADVANCED (GAUZE/BANDAGES/DRESSINGS) ×1
DERMABOND ADVANCED .7 DNX12 (GAUZE/BANDAGES/DRESSINGS) ×1 IMPLANT
DRAPE C-ARM 42X72 X-RAY (DRAPES) ×2 IMPLANT
DRAPE LAPAROTOMY TRNSV 102X78 (DRAPE) ×2 IMPLANT
DRSG TEGADERM 4X4.75 (GAUZE/BANDAGES/DRESSINGS) IMPLANT
ELECT REM PT RETURN 9FT ADLT (ELECTROSURGICAL) ×2
ELECTRODE REM PT RTRN 9FT ADLT (ELECTROSURGICAL) ×1 IMPLANT
GAUZE SPONGE 4X4 16PLY XRAY LF (GAUZE/BANDAGES/DRESSINGS) ×2 IMPLANT
GLOVE BIOGEL PI IND STRL 7.0 (GLOVE) ×1 IMPLANT
GLOVE BIOGEL PI IND STRL 7.5 (GLOVE) ×3 IMPLANT
GLOVE BIOGEL PI INDICATOR 7.0 (GLOVE) ×1
GLOVE BIOGEL PI INDICATOR 7.5 (GLOVE) ×3
GLOVE SURG SS PI 7.0 STRL IVOR (GLOVE) ×2 IMPLANT
GOWN STRL NON-REIN LRG LVL3 (GOWN DISPOSABLE) ×2 IMPLANT
GOWN STRL REIN XL XLG (GOWN DISPOSABLE) ×4 IMPLANT
IV SET HUBERPLUS 22X1 SAFETY (NEEDLE) ×2 IMPLANT
KIT BASIN OR (CUSTOM PROCEDURE TRAY) ×2 IMPLANT
KIT PORT POWER ISP 8FR (Catheter) IMPLANT
KIT POWER CATH 8FR (Catheter) ×2 IMPLANT
KIT POWER PORT SLIM 6FR (PORTABLE EQUIPMENT SUPPLIES) IMPLANT
NDL SAFETY ECLIPSE 18X1.5 (NEEDLE) IMPLANT
NEEDLE HYPO 18GX1.5 SHARP (NEEDLE)
NEEDLE HYPO 22GX1.5 SAFETY (NEEDLE) ×2 IMPLANT
NEEDLE HYPO 25X1 1.5 SAFETY (NEEDLE) ×2 IMPLANT
NS IRRIG 1000ML POUR BTL (IV SOLUTION) ×2 IMPLANT
PACK BASIC VI WITH GOWN DISP (CUSTOM PROCEDURE TRAY) ×2 IMPLANT
PEN SKIN MARKING BROAD (MISCELLANEOUS) ×2 IMPLANT
PENCIL BUTTON HOLSTER BLD 10FT (ELECTRODE) ×2 IMPLANT
SPONGE GAUZE 4X4 12PLY (GAUZE/BANDAGES/DRESSINGS) ×2 IMPLANT
STRIP CLOSURE SKIN 1/2X4 (GAUZE/BANDAGES/DRESSINGS) ×2 IMPLANT
SUT MNCRL AB 4-0 PS2 18 (SUTURE) ×2 IMPLANT
SUT PROLENE 2 0 CT2 30 (SUTURE) ×4 IMPLANT
SUT SILK 3 0 (SUTURE) ×1
SUT SILK 3-0 18XBRD TIE 12 (SUTURE) ×1 IMPLANT
SUT VIC AB 3-0 SH 27 (SUTURE) ×1
SUT VIC AB 3-0 SH 27X BRD (SUTURE) ×1 IMPLANT
SYR BULB IRRIGATION 50ML (SYRINGE) ×2 IMPLANT
SYR CONTROL 10ML LL (SYRINGE) ×2 IMPLANT
SYRINGE 10CC LL (SYRINGE) ×4 IMPLANT
TOWEL OR 17X26 10 PK STRL BLUE (TOWEL DISPOSABLE) ×2 IMPLANT
WIRE J 3MM .035X145CM (WIRE) ×2 IMPLANT

## 2012-09-15 NOTE — Progress Notes (Signed)
Portable Chest-X-RAY  Done.

## 2012-09-15 NOTE — Anesthesia Postprocedure Evaluation (Signed)
  Anesthesia Post-op Note  Patient: Jessica Wells  Procedure(s) Performed: Procedure(s) (LRB): PORT A CATH REVISION (Left)  Patient Location: PACU  Anesthesia Type: General  Level of Consciousness: awake and alert   Airway and Oxygen Therapy: Patient Spontanous Breathing  Post-op Pain: mild  Post-op Assessment: Post-op Vital signs reviewed, Patient's Cardiovascular Status Stable, Respiratory Function Stable, Patent Airway and No signs of Nausea or vomiting  Post-op Vital Signs: stable  Complications: No apparent anesthesia complications

## 2012-09-15 NOTE — Interval H&P Note (Signed)
History and Physical Interval Note:  09/15/2012 3:40 PM  Jessica Wells  has presented today for surgery, with the diagnosis of difficulty using port  The various methods of treatment have been discussed with the patient and family. After consideration of risks, benefits and other options for treatment, the patient has consented to  Procedure(s) (LRB) with comments: PORT A CATH REVISION (N/A) as a surgical intervention .  The patient's history has been reviewed, patient examined, no change in status, stable for surgery.  I have reviewed the patient's chart and labs.  Questions were answered to the patient's satisfaction.     Sruthi Maurer

## 2012-09-15 NOTE — Progress Notes (Signed)
Patient's HR increased. She says she has palpitations when it goes up, no other symptoms. "It will be gone in a minute"

## 2012-09-15 NOTE — Anesthesia Preprocedure Evaluation (Addendum)
Anesthesia Evaluation  Patient identified by MRN, date of birth, ID band Patient awake    Reviewed: Allergy & Precautions, H&P , NPO status , Patient's Chart, lab work & pertinent test results  History of Anesthesia Complications (+) PONV  Airway Mallampati: II TM Distance: >3 FB Neck ROM: full    Dental No notable dental hx. (+) Teeth Intact and Dental Advisory Given   Pulmonary neg pulmonary ROS, asthma ,  breath sounds clear to auscultation  Pulmonary exam normal       Cardiovascular Exercise Tolerance: Good negative cardio ROS  + dysrhythmias Supra Ventricular Tachycardia Rhythm:regular Rate:Normal     Neuro/Psych negative neurological ROS  negative psych ROS   GI/Hepatic negative GI ROS, Neg liver ROS, GERD-  Medicated and Controlled,  Endo/Other  negative endocrine ROS  Renal/GU negative Renal ROS  negative genitourinary   Musculoskeletal   Abdominal   Peds  Hematology negative hematology ROS (+) Blood dyscrasia, anemia , Hgb. 10.5   Anesthesia Other Findings   Reproductive/Obstetrics negative OB ROS                           Anesthesia Physical Anesthesia Plan  ASA: II  Anesthesia Plan: General   Post-op Pain Management:    Induction: Intravenous  Airway Management Planned: LMA  Additional Equipment:   Intra-op Plan:   Post-operative Plan:   Informed Consent: I have reviewed the patients History and Physical, chart, labs and discussed the procedure including the risks, benefits and alternatives for the proposed anesthesia with the patient or authorized representative who has indicated his/her understanding and acceptance.   Dental Advisory Given  Plan Discussed with: CRNA and Surgeon  Anesthesia Plan Comments: (TIVA)       Anesthesia Quick Evaluation

## 2012-09-15 NOTE — H&P (View-Only) (Signed)
OFFICE PROGRESS NOTE  CC  Jessica Alken, MD [address] [place] [address]  DIAGNOSIS: 66 year old female with new diagnosis of inflammatory breast cancer  PRIOR THERAPY: #1 patient was originally seen in my clinic for new diagnosis of inflammatory breast cancer she was referred by Dr. Emelia Loron. She had a mammogram performed That showed an abnormality. That was biopsied and it showed an invasive mammary carcinoma with lymphovascular invasion grade 3 ER negative PR negative HER-2/neu positive.  #2 patient has gone on to have MRI of the breasts performed the MRI shows diffuse right breast neoplasm on a large level I right axillary lymph nodes compatible with lymphatic spread.Patient has had a biopsy of the right axillary lymph node that is compatible with invasive mammary carcinoma.  #3 patient  began FEC 100 with day 2 Neulasta on 07/23/2012  CURRENT THERAPY: s/p cycle 3 day 1 of FEC on 08/20/12  INTERVAL HISTORY: Jessica Wells 66 y.o. female returns for Followup visit . Overall patient is doing well. She is fatigued  more than she had been previously. She however has not had any nausea or vomiting no fevers chills night sweats headaches she has no shortness of breath no chest pains or palpitations. No bleeding problems. She remains on Valtrex tolerating it well. Remainder of the 10 point review of systems is negative.  MEDICAL HISTORY: Past Medical History  Diagnosis Date  . Asthma   . GERD (gastroesophageal reflux disease)   . PONV (postoperative nausea and vomiting)     severe  . Dysrhythmia     PAT-sees dr Laurence Compton meds  . Breast cancer     inflammatory right breast ca  . Allergy   . PAT (paroxysmal atrial tachycardia)     hx  . Arthritis     osteopenia,knees    ALLERGIES:  is allergic to anesthetics, amide; bactrim; sulfa antibiotics; codeine; codeine phosphate; and eggs or egg-derived products.  MEDICATIONS:  Current Outpatient  Prescriptions  Medication Sig Dispense Refill  . albuterol (PROVENTIL HFA;VENTOLIN HFA) 108 (90 BASE) MCG/ACT inhaler Inhale 2 puffs into the lungs every 6 (six) hours as needed.      Marland Kitchen aspirin EC 81 MG tablet Take 162 mg by mouth every 6 (six) hours as needed. For blood clot symptoms      . calcium-vitamin D (OSCAL WITH D) 500-200 MG-UNIT per tablet Take 1 tablet by mouth daily.      Marland Kitchen dexamethasone (DECADRON) 4 MG tablet Take 2 tablets by mouth once a day on the day after chemotherapy and then take 2 tablets two times a day for 2 days. Take with food.  30 tablet  1  . fish oil-omega-3 fatty acids 1000 MG capsule Take 2 g by mouth daily.      . Flaxseed, Linseed, (FLAX SEEDS PO) Take 1 tablet by mouth daily.      . fluticasone (FLONASE) 50 MCG/ACT nasal spray Place 1 spray into the nose daily.      Marland Kitchen glucosamine-chondroitin 500-400 MG tablet Take 1 tablet by mouth daily.      Marland Kitchen HYDROcodone-acetaminophen (NORCO/VICODIN) 5-325 MG per tablet       . lidocaine-prilocaine (EMLA) cream Apply topically as needed.  30 g  5  . LORazepam (ATIVAN) 0.5 MG tablet Take 1 tablet (0.5 mg total) by mouth every 6 (six) hours as needed (Nausea or vomiting).  30 tablet  0  . Multiple Vitamin (MULTIVITAMIN WITH MINERALS) TABS Take 1 tablet by mouth daily.      Marland Kitchen  omeprazole (PRILOSEC) 20 MG capsule Take 20 mg by mouth daily.      . ondansetron (ZOFRAN) 8 MG tablet Take 1 tablet two times a day as needed for nausea or vomiting starting on the third day after chemotherapy.  30 tablet  1  . ondansetron (ZOFRAN-ODT) 8 MG disintegrating tablet       . oxyCODONE-acetaminophen (PERCOCET/ROXICET) 5-325 MG per tablet       . prochlorperazine (COMPAZINE) 10 MG tablet Take 1 tablet (10 mg total) by mouth every 6 (six) hours as needed (Nausea or vomiting).  30 tablet  1  . prochlorperazine (COMPAZINE) 25 MG suppository Place 1 suppository (25 mg total) rectally every 12 (twelve) hours as needed for nausea.  12 suppository  3  .  valACYclovir (VALTREX) 500 MG tablet Take 1 tablet (500 mg total) by mouth 3 (three) times daily.  90 tablet  6    SURGICAL HISTORY:  Past Surgical History  Procedure Date  . Dilation and curettage of uterus     x3  . Tonsillectomy   . Colonoscopy   . Portacath placement 07/21/2012    Procedure: INSERTION PORT-A-CATH;  Surgeon: Emelia Loron, MD;  Location: [facility];  Service: General;  Laterality: Left;    REVIEW OF SYSTEMS:  Pertinent items are noted in HPI.   PHYSICAL EXAMINATION: General appearance: alert and cooperative Lymph nodes: noted to have a palpable right axillary lymph node measuring about 2 cm. No supraclavicular adenopathy cervical lymphadenopathy inguinal or left axillary lymphadenopathy. Resp: clear to auscultation bilaterally Back: symmetric, no curvature. ROM normal. No CVA tenderness. Cardio: regular rate and rhythm, S1, S2 normal, no murmur, click, rub or gallop GI: soft, non-tender; bowel sounds normal; no masses,  no organomegaly Extremities: extremities normal, atraumatic, no cyanosis or edema Neurologic: Grossly normal Right breast reveals grossly enlarged breast with Dimpling of the skin And retraction of the nipple.Left breast no masses or nipple discharge or skin changes.  ECOG PERFORMANCE STATUS: 0 - Asymptomatic  Blood pressure 107/70, pulse 114, temperature 98.4 F (36.9 C), temperature source Oral, resp. rate 18, height 5\' 1"  (1.549 m), weight 137 lb 6.4 oz (62.324 kg).  LABORATORY DATA: Lab Results  Component Value Date   WBC 0.9* 08/27/2012   HGB 10.6* 08/27/2012   HCT 30.4* 08/27/2012   MCV 84.7 08/27/2012   PLT 67* 08/27/2012      Chemistry      Component Value Date/Time   NA 134* 08/27/2012 1143   NA 140 07/19/2012 1538   K 3.6 08/27/2012 1143   K 3.7 07/19/2012 1538   CL 99 08/27/2012 1143   CL 103 07/19/2012 1538   CO2 25 08/27/2012 1143   CO2 27 07/19/2012 1538   BUN 15.0 08/27/2012 1143   BUN 15 07/19/2012 1538    CREATININE 0.8 08/27/2012 1143   CREATININE 0.77 07/19/2012 1538      Component Value Date/Time   CALCIUM 9.2 08/27/2012 1143   CALCIUM 9.3 07/19/2012 1538   ALKPHOS 104 08/27/2012 1143   ALKPHOS 60 07/19/2012 1538   AST 11 08/27/2012 1143   AST 17 07/19/2012 1538   ALT 18 08/27/2012 1143   ALT 16 07/19/2012 1538   BILITOT 0.80 08/27/2012 1143   BILITOT 0.2* 07/19/2012 1538       RADIOGRAPHIC STUDIES:  ASSESSMENT: 66 year old female with  #1 inflammatory right breast cancer with positive lymph nodes the tumor is ER negative PR negative HER-2/neu positive. Patient is  to receive neoadjuvant chemotherapy  intially Consisting of FEC q 2 weeks x 4 cycles. Then she will receive taxol and herceptin q week x 12 weeks.Once patient completes her chemotherapy then we will get restaging MRIs performed to evaluate response to therapy. Patient does understand that she will most likely need a mastectomy because she does have inflammatory breast cancer.   PLAN:  #1 patient is neutropenic neutropenic precautions were discussed with her. I will go ahead and get her started on Cipro 500 mg twice a day. Patient also has had a herpetic outbreak and will remain on Valtrex.  #2 patient will return in one week's time for followup for cycle 4 of her chemotherapy.  All questions were answered. The patient knows to call the clinic with any problems, questions or concerns. We can certainly see the patient much sooner if necessary.  I spent 25 minutes counseling the patient face to face. The total time spent in the appointment was 30 minutes.    Drue Second, MD Medical/Oncology [facility] [phone] (beeper) [phone] (Office)

## 2012-09-15 NOTE — Progress Notes (Signed)
Portable Chest X-RAY results noted 

## 2012-09-15 NOTE — Transfer of Care (Signed)
Immediate Anesthesia Transfer of Care Note  Patient: Jessica Wells  Procedure(s) Performed: Procedure(s) (LRB) with comments: PORT A CATH REVISION (Left)  Patient Location: PACU  Anesthesia Type: General  Level of Consciousness: awake, alert  and oriented  Airway & Oxygen Therapy: Patient Spontanous Breathing  Post-op Assessment: Report given to PACU RN and Post -op Vital signs reviewed and stable  Post vital signs: Reviewed and stable  Complications: No apparent anesthesia complications

## 2012-09-16 ENCOUNTER — Other Ambulatory Visit: Payer: Self-pay | Admitting: Oncology

## 2012-09-16 DIAGNOSIS — C50119 Malignant neoplasm of central portion of unspecified female breast: Secondary | ICD-10-CM

## 2012-09-16 MED ORDER — PROCHLORPERAZINE 25 MG RE SUPP: 25.0000 mg | Freq: Two times a day (BID) | RECTAL | Status: DC | PRN

## 2012-09-16 MED ORDER — DEXAMETHASONE 4 MG PO TABS: 8.0000 mg | ORAL_TABLET | Freq: Two times a day (BID) | ORAL | Status: DC

## 2012-09-16 MED ORDER — PROCHLORPERAZINE MALEATE 10 MG PO TABS: 10.0000 mg | ORAL_TABLET | Freq: Four times a day (QID) | ORAL | Status: DC | PRN

## 2012-09-16 MED ORDER — LORAZEPAM 0.5 MG PO TABS: 0.5000 mg | ORAL_TABLET | Freq: Four times a day (QID) | ORAL | Status: DC | PRN

## 2012-09-16 MED ORDER — ONDANSETRON HCL 8 MG PO TABS: 8.0000 mg | ORAL_TABLET | Freq: Two times a day (BID) | ORAL | Status: DC

## 2012-09-16 NOTE — Op Note (Signed)
Preoperative diagnosis: Malfunctioning port Postoperative diagnosis: SAA Procedure: Revision of left subclavian port Surgeon: Dr. Harden Mo Anesthesia: Gen. With LMA Estimated blood loss: Minimal Complications: None Specimens: None Drains: None Sponge and needle count was correct x2 at operation Disposition to recovery stable  Indications: This is a 66 year old female with inflammatory breast cancer who is undergoing chemotherapy after her place of left subclavian port. Recently it looks like the line is flipped and it has become nonfunctional. I discussed with her a poor revision.  Procedure: After informed consent was obtained the patient was taken to the operating room. She was administered 2 g of intravenous cefazolin. Sequential compression devices were placed on her legs. She was then placed under general anesthesia with an LMA. Her left chest was then prepped and draped in standard sterile surgical fashion after arms were tucked and appropriately padded. Surgical timeout was performed.  I infiltrated local anesthetic into the area of her old incision. Then I reentered into this and identified the port. Fluoroscopy confirmed that the line had flipped up. I then attempted to aspirate and it would not aspirate or really flush that well. I think it is flipped up and was against the wall of the vessel. I then disconnected the line from the port. I placed a wire through the existing line. After about 20 minutes I was unable to get this wire to pass into the vena cava. This continually either went to the jugular or into the opposite subclavian. Due to that I then removed the line. I then re-accessed her subclavian vein. This took 2 passes. I then placed the stiff guidewire that was present in the kit. This went easily into the vena cava. I then placed via peel away sheath under direct vision with fluoroscopy. I then removed the wire assembly in place the line. The peel-away sheath was removed.  The line was in the distal cava. I secured this to my port that was oriented placed. I re\re sutured my port into place. This aspirated blood easily and flushed easily. I packed this with heparin. Final fluoroscopic images show by line was not kinked, my port was in good position, and my line was in the distal superior vena cava. I then closed with 3-0 Vicryl, 4 Monocryl, and Dermabond. She tolerated this well was transferred to recovery stable.

## 2012-09-17 ENCOUNTER — Other Ambulatory Visit (HOSPITAL_BASED_OUTPATIENT_CLINIC_OR_DEPARTMENT_OTHER): Payer: Medicare Other | Admitting: Lab

## 2012-09-17 ENCOUNTER — Ambulatory Visit (HOSPITAL_BASED_OUTPATIENT_CLINIC_OR_DEPARTMENT_OTHER): Payer: Medicare Other

## 2012-09-17 ENCOUNTER — Ambulatory Visit (HOSPITAL_BASED_OUTPATIENT_CLINIC_OR_DEPARTMENT_OTHER): Payer: Medicare Other | Admitting: Adult Health

## 2012-09-17 ENCOUNTER — Encounter: Payer: Self-pay | Admitting: Adult Health

## 2012-09-17 VITALS — BP 129/70 | HR 83 | Temp 98.6°F | Resp 20 | Ht 61.0 in | Wt 139.6 lb

## 2012-09-17 VITALS — BP 138/70 | HR 61 | Temp 98.0°F | Resp 18

## 2012-09-17 DIAGNOSIS — R000 Tachycardia, unspecified: Secondary | ICD-10-CM | POA: Diagnosis not present

## 2012-09-17 DIAGNOSIS — Z171 Estrogen receptor negative status [ER-]: Secondary | ICD-10-CM

## 2012-09-17 DIAGNOSIS — C50919 Malignant neoplasm of unspecified site of unspecified female breast: Secondary | ICD-10-CM

## 2012-09-17 DIAGNOSIS — C50119 Malignant neoplasm of central portion of unspecified female breast: Secondary | ICD-10-CM

## 2012-09-17 DIAGNOSIS — Z5112 Encounter for antineoplastic immunotherapy: Secondary | ICD-10-CM

## 2012-09-17 DIAGNOSIS — C773 Secondary and unspecified malignant neoplasm of axilla and upper limb lymph nodes: Secondary | ICD-10-CM

## 2012-09-17 LAB — CBC WITH DIFFERENTIAL/PLATELET
BASO%: 0.6 % (ref 0.0–2.0)
EOS%: 0 % (ref 0.0–7.0)
Eosinophils Absolute: 0 10*3/uL (ref 0.0–0.5)
LYMPH%: 6.6 % — ABNORMAL LOW (ref 14.0–49.7)
MCHC: 34.2 g/dL (ref 31.5–36.0)
MCV: 89.5 fL (ref 79.5–101.0)
MONO%: 8.9 % (ref 0.0–14.0)
NEUT#: 10.3 10*3/uL — ABNORMAL HIGH (ref 1.5–6.5)
RBC: 3.14 10*6/uL — ABNORMAL LOW (ref 3.70–5.45)
RDW: 19.3 % — ABNORMAL HIGH (ref 11.2–14.5)
nRBC: 2 % — ABNORMAL HIGH (ref 0–0)

## 2012-09-17 LAB — COMPREHENSIVE METABOLIC PANEL (CC13)
ALT: 26 U/L (ref 0–55)
AST: 19 U/L (ref 5–34)
Albumin: 3.8 g/dL (ref 3.5–5.0)
Alkaline Phosphatase: 98 U/L (ref 40–150)
BUN: 16 mg/dL (ref 7.0–26.0)
CO2: 23 meq/L (ref 22–29)
Calcium: 9.3 mg/dL (ref 8.4–10.4)
Chloride: 104 meq/L (ref 98–107)
Creatinine: 0.7 mg/dL (ref 0.6–1.1)
Glucose: 131 mg/dL — ABNORMAL HIGH (ref 70–99)
Potassium: 4 meq/L (ref 3.5–5.1)
Sodium: 136 meq/L (ref 136–145)
Total Bilirubin: 0.5 mg/dL (ref 0.20–1.20)
Total Protein: 6 g/dL — ABNORMAL LOW (ref 6.4–8.3)

## 2012-09-17 MED ORDER — ACETAMINOPHEN 325 MG PO TABS
650.0000 mg | ORAL_TABLET | Freq: Once | ORAL | Status: AC
  Administered 2012-09-17: 650 mg via ORAL

## 2012-09-17 MED ORDER — DEXAMETHASONE SODIUM PHOSPHATE 4 MG/ML IJ SOLN
20.0000 mg | Freq: Once | INTRAMUSCULAR | Status: AC
  Administered 2012-09-17: 20 mg via INTRAVENOUS

## 2012-09-17 MED ORDER — FAMOTIDINE IN NACL 20-0.9 MG/50ML-% IV SOLN
20.0000 mg | Freq: Once | INTRAVENOUS | Status: AC
  Administered 2012-09-17: 20 mg via INTRAVENOUS

## 2012-09-17 MED ORDER — SODIUM CHLORIDE 0.9 % IJ SOLN
10.0000 mL | INTRAMUSCULAR | Status: DC | PRN
  Administered 2012-09-17: 10 mL
  Filled 2012-09-17: qty 10

## 2012-09-17 MED ORDER — HEPARIN SOD (PORK) LOCK FLUSH 100 UNIT/ML IV SOLN
500.0000 [IU] | Freq: Once | INTRAVENOUS | Status: AC | PRN
  Administered 2012-09-17: 500 [IU]
  Filled 2012-09-17: qty 5

## 2012-09-17 MED ORDER — DIPHENHYDRAMINE HCL 50 MG/ML IJ SOLN
50.0000 mg | Freq: Once | INTRAMUSCULAR | Status: AC
  Administered 2012-09-17: 50 mg via INTRAVENOUS

## 2012-09-17 MED ORDER — PACLITAXEL CHEMO INJECTION 300 MG/50ML
80.0000 mg/m2 | Freq: Once | INTRAVENOUS | Status: AC
  Administered 2012-09-17: 132 mg via INTRAVENOUS
  Filled 2012-09-17: qty 22

## 2012-09-17 MED ORDER — TRASTUZUMAB CHEMO INJECTION 440 MG
4.0000 mg/kg | Freq: Once | INTRAVENOUS | Status: AC
  Administered 2012-09-17: 252 mg via INTRAVENOUS
  Filled 2012-09-17: qty 12

## 2012-09-17 MED ORDER — SODIUM CHLORIDE 0.9 % IV SOLN
Freq: Once | INTRAVENOUS | Status: AC
  Administered 2012-09-17: 12:00:00 via INTRAVENOUS

## 2012-09-17 MED ORDER — ONDANSETRON 8 MG/50ML IVPB (CHCC)
8.0000 mg | Freq: Once | INTRAVENOUS | Status: AC
  Administered 2012-09-17: 8 mg via INTRAVENOUS

## 2012-09-17 NOTE — Progress Notes (Signed)
VSS. Pt has no s/s of reaction. 

## 2012-09-17 NOTE — Progress Notes (Signed)
OFFICE PROGRESS NOTE  CC  Jessica Alken, MD [address] [place] [address]  DIAGNOSIS: 66 year old female with new diagnosis of inflammatory breast cancer  PRIOR THERAPY: #1 patient was originally seen in my clinic for new diagnosis of inflammatory breast cancer she was referred by Dr. Emelia Loron. She had a mammogram performed That showed an abnormality. That was biopsied and it showed an invasive mammary carcinoma with lymphovascular invasion grade 3 ER negative PR negative HER-2/neu positive.  #2 patient has gone on to have MRI of the breasts performed the MRI shows diffuse right breast neoplasm on a large level I right axillary lymph nodes compatible with lymphatic spread.Patient has had a biopsy of the right axillary lymph node that is compatible with invasive mammary carcinoma.  #3 patient  began FEC 100 with day 2 Neulasta on 07/23/2012  #4 Weekly Taxol and Herceptin starting 09/17/12  CURRENT THERAPY: Cycle 1 Taxol and Herceptin  INTERVAL HISTORY: Jessica Wells 66 y.o. female returns for chemotherapy today.  She will receive her first cycle of Taxol and Herceptin today.  She is feeling well since recovering from her FEC.  She had her port revised this past Wednesday, 10/16 by Dr. Dwain Sarna.  She did have some tachycardia associated with this, but is otherwise feeling well.  Her tachycardia since did resolve.  She is otherwise doing well today.    MEDICAL HISTORY: Past Medical History  Diagnosis Date  . Asthma   . GERD (gastroesophageal reflux disease)   . Dysrhythmia     PAT-sees dr Laurence Compton meds  . Breast cancer     inflammatory right breast ca  . Allergy   . PAT (paroxysmal atrial tachycardia)     hx  . Arthritis     osteopenia,knees  . PONV (postoperative nausea and vomiting) 09-13-12    severe, with Port-a-cath, was managed without PONV    ALLERGIES:  is allergic to anesthetics, amide; bactrim; sulfa antibiotics; codeine; codeine  phosphate; and eggs or egg-derived products.  MEDICATIONS:  Current Outpatient Prescriptions  Medication Sig Dispense Refill  . albuterol (PROVENTIL HFA;VENTOLIN HFA) 108 (90 BASE) MCG/ACT inhaler Inhale 2 puffs into the lungs every 6 (six) hours as needed.      Marland Kitchen aspirin EC 81 MG tablet Take 162 mg by mouth every 6 (six) hours as needed. For blood clot symptoms      . calcium-vitamin D (OSCAL WITH D) 500-200 MG-UNIT per tablet Take 1 tablet by mouth daily.      . ciprofloxacin (CIPRO) 500 MG tablet Take 500 mg by mouth 2 (two) times daily.      Marland Kitchen dexamethasone (DECADRON) 4 MG tablet Take 8 mg by mouth 2 (two) times daily with a meal. Take two tablets twice a day the day before chemo, then two tablets twice a day for three days after chemo.      Marland Kitchen dexamethasone (DECADRON) 4 MG tablet Take 2 tablets (8 mg total) by mouth 2 (two) times daily with a meal. Take daily starting the day after chemotherapy for 2 days. Take with food.  30 tablet  1  . fish oil-omega-3 fatty acids 1000 MG capsule Take 2 g by mouth daily.      . Flaxseed, Linseed, (FLAX SEEDS PO) Take 1 tablet by mouth daily.      . fluticasone (FLONASE) 50 MCG/ACT nasal spray Place 1 spray into the nose daily as needed. For allergies      . glucosamine-chondroitin 500-400 MG tablet Take 1 tablet  by mouth daily.      Marland Kitchen HYDROcodone-acetaminophen (NORCO/VICODIN) 5-325 MG per tablet Take 1 tablet by mouth every 6 (six) hours as needed. Pain      . lidocaine-prilocaine (EMLA) cream Apply topically as needed.  30 g  5  . LORazepam (ATIVAN) 0.5 MG tablet Take 1 tablet (0.5 mg total) by mouth every 6 (six) hours as needed (Nausea or vomiting).  30 tablet  0  . Multiple Vitamin (MULTIVITAMIN WITH MINERALS) TABS Take 1 tablet by mouth daily.      Marland Kitchen omeprazole (PRILOSEC) 20 MG capsule Take 20 mg by mouth daily as needed. For heartburn      . ondansetron (ZOFRAN) 8 MG tablet Take 1 tablet (8 mg total) by mouth 2 (two) times daily. Take two times a day  starting the day after chemo for 2 days. Then take two times a day as needed for nausea or vomiting.  30 tablet  1  . oxyCODONE-acetaminophen (PERCOCET/ROXICET) 5-325 MG per tablet       . oxyCODONE-acetaminophen (ROXICET) 5-325 MG per tablet Take 1 tablet by mouth every 4 (four) hours as needed for pain.  15 tablet  0  . prochlorperazine (COMPAZINE) 10 MG tablet Take 1 tablet (10 mg total) by mouth every 6 (six) hours as needed (Nausea or vomiting).  30 tablet  1  . prochlorperazine (COMPAZINE) 25 MG suppository Place 1 suppository (25 mg total) rectally every 12 (twelve) hours as needed for nausea.  12 suppository  3  . valACYclovir (VALTREX) 500 MG tablet Take 1 tablet (500 mg total) by mouth 3 (three) times daily.  90 tablet  6    SURGICAL HISTORY:  Past Surgical History  Procedure Date  . Dilation and curettage of uterus     x3  . Tonsillectomy   . Colonoscopy   . Portacath placement 07/21/2012    Procedure: INSERTION PORT-A-CATH;  Surgeon: Emelia Loron, MD;  Location: [facility];  Service: General;  Laterality: Left;    REVIEW OF SYSTEMS:   General: fatigue (+), night sweats (-), fever (-), pain (-) Lymph: palpable nodes (-) HEENT: vision changes (-), mucositis (-), gum bleeding (-), epistaxis (-) Cardiovascular: chest pain (-), palpitations (-) Pulmonary: shortness of breath (-), dyspnea on exertion (-), cough (-), hemoptysis (-) GI:  Early satiety (-), melena (-), dysphagia (-), nausea/vomiting (-), diarrhea (-) GU: dysuria (-), hematuria (-), incontinence (-) Musculoskeletal: joint swelling (-), joint pain (-), back pain (-) Neuro: weakness (-), numbness (-), headache (-), confusion (-) Skin: Rash (-), lesions (-), dryness (-) Psych: depression (-), suicidal/homicidal ideation (-), feeling of hopelessness (-)   PHYSICAL EXAMINATION:  BP 129/70  Pulse 83  Temp 98.6 F (37 C) (Oral)  Resp 20  Ht 5\' 1"  (1.549 m)  Wt 139 lb 9.6 oz (63.322 kg)  BMI  26.38 kg/m2 General: Patient is a well appearing female in no acute distress HEENT: PERRLA, sclerae anicteric no conjunctival pallor, MMM Neck: supple, no palpable adenopathy Lungs: clear to auscultation bilaterally, no wheezes, rhonchi, or rales Cardiovascular: regular rate rhythm, S1, S2, no murmurs, rubs or gallops Abdomen: Soft, non-tender, non-distended, normoactive bowel sounds, no HSM Extremities: warm and well perfused, no clubbing, cyanosis, or edema Skin: No rashes or lesions Neuro: Non-focal Right breast reveals grossly enlarged breast with Dimpling of the skin And retraction of the nipple, improving ECOG PERFORMANCE STATUS: 0 - Asymptomatic   LABORATORY DATA: Lab Results  Component Value Date   WBC 12.3* 09/17/2012  HGB 9.6* 09/17/2012   HCT 28.1* 09/17/2012   MCV 89.5 09/17/2012   PLT 130* 09/17/2012  ANC 400    Chemistry      Component Value Date/Time   NA 137 09/15/2012 1330   NA 140 09/03/2012 1003   K 3.4* 09/15/2012 1330   K 3.6 09/03/2012 1003   CL 100 09/15/2012 1330   CL 105 09/03/2012 1003   CO2 27 09/15/2012 1330   CO2 24 09/03/2012 1003   BUN 9 09/15/2012 1330   BUN 11.0 09/03/2012 1003   CREATININE 0.66 09/15/2012 1330   CREATININE 0.8 09/03/2012 1003      Component Value Date/Time   CALCIUM 9.3 09/15/2012 1330   CALCIUM 8.9 09/03/2012 1003   ALKPHOS 96 09/10/2012 1401   ALKPHOS 102 09/03/2012 1003   AST 12 09/10/2012 1401   AST 20 09/03/2012 1003   ALT 16 09/10/2012 1401   ALT 24 09/03/2012 1003   BILITOT 0.9 09/10/2012 1401   BILITOT 0.40 09/03/2012 1003       RADIOGRAPHIC STUDIES:  ASSESSMENT: 66 year old female with  #1 inflammatory right breast cancer with positive lymph nodes the tumor is ER negative PR negative HER-2/neu positive. Patient is  to receive neoadjuvant chemotherapy intially Consisting of FEC q 2 weeks x 4 cycles. Then she will receive taxol and herceptin q week x 12 weeks.Once patient completes her chemotherapy then we will  get restaging MRIs performed to evaluate response to therapy. Patient does understand that she will most likely need a mastectomy because she does have inflammatory breast cancer.  #2 Tachycardia  PLAN:   #1 Ms. Kavanagh is doing well today.  Her counts have recovered from her neutropenia associated with her previous FEC chemotherapy regimen.  Her port has been revised, and she is ready to begin her weekly Taxol and Herceptin.  She will receive this today.    #2 Ms. Domagalski does occasionally have tachycardia that she associates with being dehydrated.  Her HR is 83 today.  She will keep an eye on this during the coming week and will call us should it re-emerge.    All questions were answered. The patient knows to call the clinic with any problems, questions or concerns. We can certainly see the patient much sooner if necessary.  I spent 25 minutes counseling the patient face to face. The total time spent in the appointment was 30 minutes.   Cherie Ouch Lyn Hollingshead, NP Medical Oncology [facility] Phone: [phone]

## 2012-09-17 NOTE — Patient Instructions (Signed)
Doing well.  Proceed with chemotherapy.  We will see you back next week.  Should your heart rate increase or you begin to feel dehydrated please give [place] a call.

## 2012-09-17 NOTE — Patient Instructions (Addendum)
[facility] Discharge Instructions for Patients Receiving Chemotherapy  Today you received the following chemotherapy agents : Taxol, Herceptin  To help prevent nausea and vomiting after your treatment, we encourage you to take your nausea medication as directed by your MD. If you develop nausea and vomiting that is not controlled by your nausea medication, call the clinic. If it is after clinic hours your family physician or the after hours number for the clinic or go to the Emergency Department.   BELOW ARE SYMPTOMS THAT SHOULD BE REPORTED IMMEDIATELY:  *FEVER GREATER THAN 100.5 F  *CHILLS WITH OR WITHOUT FEVER  NAUSEA AND VOMITING THAT IS NOT CONTROLLED WITH YOUR NAUSEA MEDICATION  *UNUSUAL SHORTNESS OF BREATH  *UNUSUAL BRUISING OR BLEEDING  TENDERNESS IN MOUTH AND THROAT WITH OR WITHOUT PRESENCE OF ULCERS  *URINARY PROBLEMS  *BOWEL PROBLEMS  UNUSUAL RASH Items with * indicate a potential emergency and should be followed up as soon as possible.  One of the nurses will contact you Monday.  Please let the nurse know about any problems that you may have experienced. Feel free to call the clinic you have any questions or concerns. The clinic phone number is [phone].

## 2012-09-20 ENCOUNTER — Telehealth: Payer: Self-pay | Admitting: *Deleted

## 2012-09-20 ENCOUNTER — Other Ambulatory Visit: Payer: Self-pay | Admitting: Certified Registered Nurse Anesthetist

## 2012-09-20 NOTE — Telephone Encounter (Signed)
Jessica Wells says she is doing well. Denies n/v.  Is taking decadron as ordered post treatment.  Reports having bowel movements daily due to taking stool softeners.  Denies questions at this time.

## 2012-09-20 NOTE — Telephone Encounter (Signed)
Message copied by Augusto Garbe on Mon Sep 20, 2012  1:26 PM ------      Message from: Faith Rogue F      Created: Fri Sep 17, 2012 12:04 PM      Regarding: chemo f/u call       New drugs- Taxol, Herceptin      Dr. Welton Flakes

## 2012-09-24 ENCOUNTER — Other Ambulatory Visit (HOSPITAL_BASED_OUTPATIENT_CLINIC_OR_DEPARTMENT_OTHER): Payer: Medicare Other | Admitting: Lab

## 2012-09-24 ENCOUNTER — Ambulatory Visit (HOSPITAL_BASED_OUTPATIENT_CLINIC_OR_DEPARTMENT_OTHER): Payer: Medicare Other

## 2012-09-24 ENCOUNTER — Encounter: Payer: Self-pay | Admitting: Adult Health

## 2012-09-24 ENCOUNTER — Ambulatory Visit (HOSPITAL_BASED_OUTPATIENT_CLINIC_OR_DEPARTMENT_OTHER): Payer: Medicare Other | Admitting: Adult Health

## 2012-09-24 VITALS — BP 127/73 | HR 86 | Temp 98.4°F | Resp 20 | Ht 61.0 in | Wt 139.9 lb

## 2012-09-24 DIAGNOSIS — C773 Secondary and unspecified malignant neoplasm of axilla and upper limb lymph nodes: Secondary | ICD-10-CM

## 2012-09-24 DIAGNOSIS — Z5111 Encounter for antineoplastic chemotherapy: Secondary | ICD-10-CM | POA: Diagnosis not present

## 2012-09-24 DIAGNOSIS — C50919 Malignant neoplasm of unspecified site of unspecified female breast: Secondary | ICD-10-CM

## 2012-09-24 DIAGNOSIS — C50119 Malignant neoplasm of central portion of unspecified female breast: Secondary | ICD-10-CM

## 2012-09-24 DIAGNOSIS — R000 Tachycardia, unspecified: Secondary | ICD-10-CM | POA: Diagnosis not present

## 2012-09-24 DIAGNOSIS — Z5112 Encounter for antineoplastic immunotherapy: Secondary | ICD-10-CM | POA: Diagnosis not present

## 2012-09-24 DIAGNOSIS — Z171 Estrogen receptor negative status [ER-]: Secondary | ICD-10-CM

## 2012-09-24 LAB — CBC WITH DIFFERENTIAL/PLATELET
Basophils Absolute: 0 10*3/uL (ref 0.0–0.1)
Eosinophils Absolute: 0 10*3/uL (ref 0.0–0.5)
HGB: 9.5 g/dL — ABNORMAL LOW (ref 11.6–15.9)
MONO#: 1.2 10*3/uL — ABNORMAL HIGH (ref 0.1–0.9)
NEUT#: 12.7 10*3/uL — ABNORMAL HIGH (ref 1.5–6.5)
RBC: 3.11 10*6/uL — ABNORMAL LOW (ref 3.70–5.45)
RDW: 19.5 % — ABNORMAL HIGH (ref 11.2–14.5)
WBC: 14.9 10*3/uL — ABNORMAL HIGH (ref 3.9–10.3)
lymph#: 1 10*3/uL (ref 0.9–3.3)
nRBC: 1 % — ABNORMAL HIGH (ref 0–0)

## 2012-09-24 LAB — COMPREHENSIVE METABOLIC PANEL (CC13)
ALT: 30 U/L (ref 0–55)
Albumin: 3.8 g/dL (ref 3.5–5.0)
CO2: 23 mEq/L (ref 22–29)
Calcium: 9.5 mg/dL (ref 8.4–10.4)
Chloride: 103 mEq/L (ref 98–107)
Glucose: 114 mg/dl — ABNORMAL HIGH (ref 70–99)
Potassium: 3.7 mEq/L (ref 3.5–5.1)
Sodium: 136 mEq/L (ref 136–145)
Total Bilirubin: 0.6 mg/dL (ref 0.20–1.20)
Total Protein: 6 g/dL — ABNORMAL LOW (ref 6.4–8.3)

## 2012-09-24 MED ORDER — SODIUM CHLORIDE 0.9 % IV SOLN: Freq: Once | INTRAVENOUS | Status: AC

## 2012-09-24 MED ORDER — PACLITAXEL CHEMO INJECTION 300 MG/50ML
80.0000 mg/m2 | Freq: Once | INTRAVENOUS | Status: AC
  Administered 2012-09-24: 132 mg via INTRAVENOUS
  Filled 2012-09-24: qty 22

## 2012-09-24 MED ORDER — FAMOTIDINE IN NACL 20-0.9 MG/50ML-% IV SOLN
20.0000 mg | Freq: Once | INTRAVENOUS | Status: AC
  Administered 2012-09-24: 20 mg via INTRAVENOUS

## 2012-09-24 MED ORDER — ONDANSETRON 8 MG/50ML IVPB (CHCC)
8.0000 mg | Freq: Once | INTRAVENOUS | Status: AC
  Administered 2012-09-24: 8 mg via INTRAVENOUS

## 2012-09-24 MED ORDER — DIPHENHYDRAMINE HCL 50 MG/ML IJ SOLN
50.0000 mg | Freq: Once | INTRAMUSCULAR | Status: AC
  Administered 2012-09-24: 50 mg via INTRAVENOUS

## 2012-09-24 MED ORDER — HEPARIN SOD (PORK) LOCK FLUSH 100 UNIT/ML IV SOLN
500.0000 [IU] | Freq: Once | INTRAVENOUS | Status: AC | PRN
  Administered 2012-09-24: 500 [IU]
  Filled 2012-09-24: qty 5

## 2012-09-24 MED ORDER — ACETAMINOPHEN 325 MG PO TABS
650.0000 mg | ORAL_TABLET | Freq: Once | ORAL | Status: AC
  Administered 2012-09-24: 650 mg via ORAL

## 2012-09-24 MED ORDER — DIPHENHYDRAMINE HCL 25 MG PO CAPS: 50.0000 mg | ORAL_CAPSULE | Freq: Once | ORAL | Status: AC

## 2012-09-24 MED ORDER — TRASTUZUMAB CHEMO INJECTION 440 MG
2.0000 mg/kg | Freq: Once | INTRAVENOUS | Status: AC
  Administered 2012-09-24: 126 mg via INTRAVENOUS
  Filled 2012-09-24: qty 6

## 2012-09-24 MED ORDER — SODIUM CHLORIDE 0.9 % IV SOLN
Freq: Once | INTRAVENOUS | Status: AC
  Administered 2012-09-24: 13:00:00 via INTRAVENOUS

## 2012-09-24 MED ORDER — SODIUM CHLORIDE 0.9 % IJ SOLN
10.0000 mL | INTRAMUSCULAR | Status: DC | PRN
  Administered 2012-09-24: 10 mL
  Filled 2012-09-24: qty 10

## 2012-09-24 MED ORDER — DEXAMETHASONE SODIUM PHOSPHATE 4 MG/ML IJ SOLN
20.0000 mg | Freq: Once | INTRAMUSCULAR | Status: AC
  Administered 2012-09-24: 20 mg via INTRAVENOUS

## 2012-09-24 NOTE — Patient Instructions (Addendum)
[facility] Discharge Instructions for Patients Receiving Chemotherapy  Today you received the following chemotherapy agents Taxol/Herceptin To help prevent nausea and vomiting after your treatment, we encourage you to take your nausea medication as prescribed.  If you develop nausea and vomiting that is not controlled by your nausea medication, call the clinic. If it is after clinic hours your family physician or the after hours number for the clinic or go to the Emergency Department.   BELOW ARE SYMPTOMS THAT SHOULD BE REPORTED IMMEDIATELY:  *FEVER GREATER THAN 100.5 F  *CHILLS WITH OR WITHOUT FEVER  NAUSEA AND VOMITING THAT IS NOT CONTROLLED WITH YOUR NAUSEA MEDICATION  *UNUSUAL SHORTNESS OF BREATH  *UNUSUAL BRUISING OR BLEEDING  TENDERNESS IN MOUTH AND THROAT WITH OR WITHOUT PRESENCE OF ULCERS  *URINARY PROBLEMS  *BOWEL PROBLEMS  UNUSUAL RASH Items with * indicate a potential emergency and should be followed up as soon as possible.  One of the nurses will contact you 24 hours after your treatment. Please let the nurse know about any problems that you may have experienced. Feel free to call the clinic you have any questions or concerns. The clinic phone number is [phone].   I have been informed and understand all the instructions given to me. I know to contact the clinic, my physician, or go to the Emergency Department if any problems should occur. I do not have any questions at this time, but understand that I may call the clinic during office hours   should I have any questions or need assistance in obtaining follow up care.    __________________________________________  _____________  __________ Signature of Patient or Authorized Representative            Date                   Time    __________________________________________ Nurse's Signature

## 2012-09-24 NOTE — Patient Instructions (Signed)
Doing well.  Proceed with chemotherapy.  We will see you back next week.   

## 2012-09-24 NOTE — Progress Notes (Signed)
OFFICE PROGRESS NOTE  CC  [name], MD [address] [place] [address]  DIAGNOSIS: 66 year old female with new diagnosis of inflammatory breast cancer  PRIOR THERAPY: #1 patient was originally seen in my clinic for new diagnosis of inflammatory breast cancer she was referred by Dr. Emelia Loron. She had a mammogram performed That showed an abnormality. That was biopsied and it showed an invasive mammary carcinoma with lymphovascular invasion grade 3 ER negative PR negative HER-2/neu positive.  #2 patient has gone on to have MRI of the breasts performed the MRI shows diffuse right breast neoplasm on a large level I right axillary lymph nodes compatible with lymphatic spread.Patient has had a biopsy of the right axillary lymph node that is compatible with invasive mammary carcinoma.  #3 patient  began FEC 100 with day 2 Neulasta on 07/23/2012  #4 Weekly Taxol and Herceptin starting 09/17/12  CURRENT THERAPY: Cycle 1 Taxol and Herceptin  INTERVAL HISTORY: Jessica Wells 66 y.o. female returns for chemotherapy today. She started her first cycle of Taxol and Herceptin last Friday.  She did well with this.  She did not have any ill effects other than fatigue on Tues/Wed.  Otherwise she is doing good.      MEDICAL HISTORY: Past Medical History  Diagnosis Date  . Asthma   . GERD (gastroesophageal reflux disease)   . Dysrhythmia     PAT-sees dr Laurence Compton meds  . Breast cancer     inflammatory right breast ca  . Allergy   . PAT (paroxysmal atrial tachycardia)     hx  . Arthritis     osteopenia,knees  . PONV (postoperative nausea and vomiting) 09-13-12    severe, with Port-a-cath, was managed without PONV    ALLERGIES:  is allergic to anesthetics, amide; bactrim; sulfa antibiotics; codeine; codeine phosphate; and eggs or egg-derived products.  MEDICATIONS:  Current Outpatient Prescriptions  Medication Sig Dispense Refill  . albuterol (PROVENTIL  HFA;VENTOLIN HFA) 108 (90 BASE) MCG/ACT inhaler Inhale 2 puffs into the lungs every 6 (six) hours as needed.      Marland Kitchen aspirin EC 81 MG tablet Take 162 mg by mouth every 6 (six) hours as needed. For blood clot symptoms      . calcium-vitamin D (OSCAL WITH D) 500-200 MG-UNIT per tablet Take 1 tablet by mouth daily.      . ciprofloxacin (CIPRO) 500 MG tablet Take 500 mg by mouth 2 (two) times daily.      Marland Kitchen dexamethasone (DECADRON) 4 MG tablet Take 8 mg by mouth 2 (two) times daily with a meal. Take two tablets twice a day the day before chemo, then two tablets twice a day for three days after chemo.      Marland Kitchen dexamethasone (DECADRON) 4 MG tablet Take 2 tablets (8 mg total) by mouth 2 (two) times daily with a meal. Take daily starting the day after chemotherapy for 2 days. Take with food.  30 tablet  1  . fish oil-omega-3 fatty acids 1000 MG capsule Take 2 g by mouth daily.      . Flaxseed, Linseed, (FLAX SEEDS PO) Take 1 tablet by mouth daily.      . fluticasone (FLONASE) 50 MCG/ACT nasal spray Place 1 spray into the nose daily as needed. For allergies      . glucosamine-chondroitin 500-400 MG tablet Take 1 tablet by mouth daily.      Marland Kitchen HYDROcodone-acetaminophen (NORCO/VICODIN) 5-325 MG per tablet Take 1 tablet by mouth every 6 (six) hours  as needed. Pain      . lidocaine-prilocaine (EMLA) cream Apply topically as needed.  30 g  5  . LORazepam (ATIVAN) 0.5 MG tablet Take 1 tablet (0.5 mg total) by mouth every 6 (six) hours as needed (Nausea or vomiting).  30 tablet  0  . Multiple Vitamin (MULTIVITAMIN WITH MINERALS) TABS Take 1 tablet by mouth daily.      Marland Kitchen omeprazole (PRILOSEC) 20 MG capsule Take 20 mg by mouth daily as needed. For heartburn      . ondansetron (ZOFRAN) 8 MG tablet Take 1 tablet (8 mg total) by mouth 2 (two) times daily. Take two times a day starting the day after chemo for 2 days. Then take two times a day as needed for nausea or vomiting.  30 tablet  1  . oxyCODONE-acetaminophen  (PERCOCET/ROXICET) 5-325 MG per tablet       . oxyCODONE-acetaminophen (ROXICET) 5-325 MG per tablet Take 1 tablet by mouth every 4 (four) hours as needed for pain.  15 tablet  0  . prochlorperazine (COMPAZINE) 10 MG tablet Take 1 tablet (10 mg total) by mouth every 6 (six) hours as needed (Nausea or vomiting).  30 tablet  1  . prochlorperazine (COMPAZINE) 25 MG suppository Place 1 suppository (25 mg total) rectally every 12 (twelve) hours as needed for nausea.  12 suppository  3  . valACYclovir (VALTREX) 500 MG tablet Take 1 tablet (500 mg total) by mouth 3 (three) times daily.  90 tablet  6    SURGICAL HISTORY:  Past Surgical History  Procedure Date  . Dilation and curettage of uterus     x3  . Tonsillectomy   . Colonoscopy   . Portacath placement 07/21/2012    Procedure: INSERTION PORT-A-CATH;  Surgeon: Emelia Loron, MD;  Location: [facility];  Service: General;  Laterality: Left;    REVIEW OF SYSTEMS:   General: fatigue (+), night sweats (-), fever (-), pain (-) Lymph: palpable nodes (-) HEENT: vision changes (-), mucositis (-), gum bleeding (-), epistaxis (-) Cardiovascular: chest pain (-), palpitations (-) Pulmonary: shortness of breath (-), dyspnea on exertion (-), cough (-), hemoptysis (-) GI:  Early satiety (-), melena (-), dysphagia (-), nausea/vomiting (-), diarrhea (-) GU: dysuria (-), hematuria (-), incontinence (-) Musculoskeletal: joint swelling (-), joint pain (-), back pain (-) Neuro: weakness (-), numbness (-), headache (-), confusion (-) Skin: Rash (-), lesions (-), dryness (-) Psych: depression (-), suicidal/homicidal ideation (-), feeling of hopelessness (-)   PHYSICAL EXAMINATION:  BP 127/73  Pulse 86  Temp 98.4 F (36.9 C) (Oral)  Resp 20  Ht 5\' 1"  (1.549 m)  Wt 139 lb 14.4 oz (63.458 kg)  BMI 26.43 kg/m2 General: Patient is a well appearing female in no acute distress HEENT: PERRLA, sclerae anicteric no conjunctival pallor,  MMM Neck: supple, no palpable adenopathy Lungs: clear to auscultation bilaterally, no wheezes, rhonchi, or rales Cardiovascular: regular rate rhythm, S1, S2, no murmurs, rubs or gallops Abdomen: Soft, non-tender, non-distended, normoactive bowel sounds, no HSM Extremities: warm and well perfused, no clubbing, cyanosis, or edema Skin: No rashes or lesions Neuro: Non-focal Right breast reveals grossly enlarged breast with Dimpling of the skin And retraction of the nipple, improving ECOG PERFORMANCE STATUS: 0 - Asymptomatic   LABORATORY DATA: Lab Results  Component Value Date   WBC 14.9* 09/24/2012   HGB 9.5* 09/24/2012   HCT 27.7* 09/24/2012   MCV 89.1 09/24/2012   PLT 191 09/24/2012      Chemistry  Component Value Date/Time   NA 136 09/17/2012 1008   NA 137 09/15/2012 1330   K 4.0 09/17/2012 1008   K 3.4* 09/15/2012 1330   CL 104 09/17/2012 1008   CL 100 09/15/2012 1330   CO2 23 09/17/2012 1008   CO2 27 09/15/2012 1330   BUN 16.0 09/17/2012 1008   BUN 9 09/15/2012 1330   CREATININE 0.7 09/17/2012 1008   CREATININE 0.66 09/15/2012 1330      Component Value Date/Time   CALCIUM 9.3 09/17/2012 1008   CALCIUM 9.3 09/15/2012 1330   ALKPHOS 98 09/17/2012 1008   ALKPHOS 96 09/10/2012 1401   AST 19 09/17/2012 1008   AST 12 09/10/2012 1401   ALT 26 09/17/2012 1008   ALT 16 09/10/2012 1401   BILITOT 0.50 09/17/2012 1008   BILITOT 0.9 09/10/2012 1401       RADIOGRAPHIC STUDIES:  ASSESSMENT: 66 year old female with  #1 inflammatory right breast cancer with positive lymph nodes the tumor is ER negative PR negative HER-2/neu positive. Patient is  to receive neoadjuvant chemotherapy intially Consisting of FEC q 2 weeks x 4 cycles. Then she will receive taxol and herceptin q week x 12 weeks.Once patient completes her chemotherapy then we will get restaging MRIs performed to evaluate response to therapy. Patient does understand that she will most likely need a mastectomy  because she does have inflammatory breast cancer.  #2 Tachycardia  PLAN:   #1 Ms. Kester is doing well today. She is feeling well with her Taxol and Herceptin.  She is ready for her next cycle today.  Her labs look good.    #2 Ms. Brickell does occasionally have tachycardia that she associates with being dehydrated.  Her HR is 83 today.  We will give her IVF today with her chemo.  It helps her with her dehydration and tachycardia.    All questions were answered. The patient knows to call the clinic with any problems, questions or concerns. We can certainly see the patient much sooner if necessary.  I spent 15 minutes counseling the patient face to face. The total time spent in the appointment was 30 minutes.  This case was reviewed with Dr. Welton Flakes.  Cherie Ouch Lyn Hollingshead, NP Medical Oncology [facility] Phone: [phone]

## 2012-10-01 ENCOUNTER — Encounter: Payer: Self-pay | Admitting: Oncology

## 2012-10-01 ENCOUNTER — Telehealth: Payer: Self-pay | Admitting: *Deleted

## 2012-10-01 ENCOUNTER — Ambulatory Visit (HOSPITAL_BASED_OUTPATIENT_CLINIC_OR_DEPARTMENT_OTHER): Payer: Medicare Other | Admitting: Oncology

## 2012-10-01 ENCOUNTER — Ambulatory Visit (HOSPITAL_BASED_OUTPATIENT_CLINIC_OR_DEPARTMENT_OTHER): Payer: Medicare Other

## 2012-10-01 ENCOUNTER — Other Ambulatory Visit (HOSPITAL_BASED_OUTPATIENT_CLINIC_OR_DEPARTMENT_OTHER): Payer: Medicare Other | Admitting: Lab

## 2012-10-01 VITALS — BP 132/73 | HR 98 | Temp 98.4°F | Resp 20 | Ht 61.0 in | Wt 139.0 lb

## 2012-10-01 DIAGNOSIS — C773 Secondary and unspecified malignant neoplasm of axilla and upper limb lymph nodes: Secondary | ICD-10-CM

## 2012-10-01 DIAGNOSIS — C50919 Malignant neoplasm of unspecified site of unspecified female breast: Secondary | ICD-10-CM

## 2012-10-01 DIAGNOSIS — Z5112 Encounter for antineoplastic immunotherapy: Secondary | ICD-10-CM

## 2012-10-01 DIAGNOSIS — C50119 Malignant neoplasm of central portion of unspecified female breast: Secondary | ICD-10-CM

## 2012-10-01 DIAGNOSIS — Z5111 Encounter for antineoplastic chemotherapy: Secondary | ICD-10-CM

## 2012-10-01 DIAGNOSIS — D649 Anemia, unspecified: Secondary | ICD-10-CM

## 2012-10-01 DIAGNOSIS — R000 Tachycardia, unspecified: Secondary | ICD-10-CM | POA: Diagnosis not present

## 2012-10-01 LAB — CBC WITH DIFFERENTIAL/PLATELET
Basophils Absolute: 0 10*3/uL (ref 0.0–0.1)
Eosinophils Absolute: 0 10*3/uL (ref 0.0–0.5)
LYMPH%: 6 % — ABNORMAL LOW (ref 14.0–49.7)
MCV: 92.2 fL (ref 79.5–101.0)
MONO%: 5.6 % (ref 0.0–14.0)
NEUT#: 10.9 10*3/uL — ABNORMAL HIGH (ref 1.5–6.5)
Platelets: 148 10*3/uL (ref 145–400)
RBC: 3.19 10*6/uL — ABNORMAL LOW (ref 3.70–5.45)

## 2012-10-01 LAB — COMPREHENSIVE METABOLIC PANEL (CC13)
Alkaline Phosphatase: 77 U/L (ref 40–150)
BUN: 20 mg/dL (ref 7.0–26.0)
Glucose: 164 mg/dl — ABNORMAL HIGH (ref 70–99)
Sodium: 136 mEq/L (ref 136–145)
Total Bilirubin: 0.51 mg/dL (ref 0.20–1.20)

## 2012-10-01 MED ORDER — TRASTUZUMAB CHEMO INJECTION 440 MG
2.0000 mg/kg | Freq: Once | INTRAVENOUS | Status: AC
  Administered 2012-10-01: 126 mg via INTRAVENOUS
  Filled 2012-10-01: qty 6

## 2012-10-01 MED ORDER — ONDANSETRON 8 MG/50ML IVPB (CHCC)
8.0000 mg | Freq: Once | INTRAVENOUS | Status: AC
  Administered 2012-10-01: 8 mg via INTRAVENOUS

## 2012-10-01 MED ORDER — SODIUM CHLORIDE 0.9 % IV SOLN: Freq: Once | INTRAVENOUS | Status: DC

## 2012-10-01 MED ORDER — DIPHENHYDRAMINE HCL 50 MG/ML IJ SOLN
50.0000 mg | Freq: Once | INTRAMUSCULAR | Status: AC
  Administered 2012-10-01: 50 mg via INTRAVENOUS

## 2012-10-01 MED ORDER — FAMOTIDINE IN NACL 20-0.9 MG/50ML-% IV SOLN
20.0000 mg | Freq: Once | INTRAVENOUS | Status: AC
  Administered 2012-10-01: 20 mg via INTRAVENOUS

## 2012-10-01 MED ORDER — HEPARIN SOD (PORK) LOCK FLUSH 100 UNIT/ML IV SOLN
500.0000 [IU] | Freq: Once | INTRAVENOUS | Status: AC | PRN
  Administered 2012-10-01: 500 [IU]
  Filled 2012-10-01: qty 5

## 2012-10-01 MED ORDER — PACLITAXEL CHEMO INJECTION 300 MG/50ML
80.0000 mg/m2 | Freq: Once | INTRAVENOUS | Status: AC
  Administered 2012-10-01: 132 mg via INTRAVENOUS
  Filled 2012-10-01: qty 22

## 2012-10-01 MED ORDER — SODIUM CHLORIDE 0.9 % IJ SOLN
10.0000 mL | INTRAMUSCULAR | Status: DC | PRN
  Administered 2012-10-01: 10 mL
  Filled 2012-10-01: qty 10

## 2012-10-01 MED ORDER — ACETAMINOPHEN 325 MG PO TABS
650.0000 mg | ORAL_TABLET | Freq: Once | ORAL | Status: AC
  Administered 2012-10-01: 650 mg via ORAL

## 2012-10-01 MED ORDER — DEXAMETHASONE SODIUM PHOSPHATE 4 MG/ML IJ SOLN
20.0000 mg | Freq: Once | INTRAMUSCULAR | Status: AC
  Administered 2012-10-01: 20 mg via INTRAVENOUS

## 2012-10-01 NOTE — Patient Instructions (Addendum)
[facility] Discharge Instructions for Patients Receiving Chemotherapy  Today you received the following chemotherapy agents taxol and herceptin  To help prevent nausea and vomiting after your treatment, we encourage you to take your nausea medication zofran and compazine Begin taking it at 8:00pm and take it as often as prescribed for the next 48-72 hours.   If you develop nausea and vomiting that is not controlled by your nausea medication, call the clinic. If it is after clinic hours your family physician or the after hours number for the clinic or go to the Emergency Department.   BELOW ARE SYMPTOMS THAT SHOULD BE REPORTED IMMEDIATELY:  *FEVER GREATER THAN 100.5 F  *CHILLS WITH OR WITHOUT FEVER  NAUSEA AND VOMITING THAT IS NOT CONTROLLED WITH YOUR NAUSEA MEDICATION  *UNUSUAL SHORTNESS OF BREATH  *UNUSUAL BRUISING OR BLEEDING  TENDERNESS IN MOUTH AND THROAT WITH OR WITHOUT PRESENCE OF ULCERS  *URINARY PROBLEMS  *BOWEL PROBLEMS  UNUSUAL RASH Items with * indicate a potential emergency and should be followed up as soon as possible.   Please let the nurse know about any problems that you may have experienced. Feel free to call the clinic you have any questions or concerns. The clinic phone number is [phone].   I have been informed and understand all the instructions given to me. I know to contact the clinic, my physician, or go to the Emergency Department if any problems should occur. I do not have any questions at this time, but understand that I may call the clinic during office hours   should I have any questions or need assistance in obtaining follow up care.    __________________________________________  _____________  __________ Signature of Patient or Authorized Representative            Date                   Time    __________________________________________ Nurse's Signature

## 2012-10-01 NOTE — Telephone Encounter (Signed)
AS OF 10-01-2012 NOTHING NEEDS TO BE SCHEDULED

## 2012-10-01 NOTE — Patient Instructions (Addendum)
Proceed with chemotherapy.

## 2012-10-06 ENCOUNTER — Encounter: Payer: Self-pay | Admitting: *Deleted

## 2012-10-08 ENCOUNTER — Encounter: Payer: Self-pay | Admitting: Adult Health

## 2012-10-08 ENCOUNTER — Other Ambulatory Visit (HOSPITAL_BASED_OUTPATIENT_CLINIC_OR_DEPARTMENT_OTHER): Payer: Medicare Other | Admitting: Lab

## 2012-10-08 ENCOUNTER — Ambulatory Visit (HOSPITAL_BASED_OUTPATIENT_CLINIC_OR_DEPARTMENT_OTHER): Payer: Medicare Other | Admitting: Adult Health

## 2012-10-08 ENCOUNTER — Ambulatory Visit (HOSPITAL_BASED_OUTPATIENT_CLINIC_OR_DEPARTMENT_OTHER): Payer: Medicare Other

## 2012-10-08 ENCOUNTER — Telehealth: Payer: Self-pay | Admitting: Oncology

## 2012-10-08 VITALS — BP 143/74 | HR 89 | Temp 98.2°F | Resp 20 | Ht 61.0 in | Wt 143.4 lb

## 2012-10-08 DIAGNOSIS — K1230 Oral mucositis (ulcerative), unspecified: Secondary | ICD-10-CM

## 2012-10-08 DIAGNOSIS — K1370 Unspecified lesions of oral mucosa: Secondary | ICD-10-CM

## 2012-10-08 DIAGNOSIS — C50919 Malignant neoplasm of unspecified site of unspecified female breast: Secondary | ICD-10-CM

## 2012-10-08 DIAGNOSIS — C50119 Malignant neoplasm of central portion of unspecified female breast: Secondary | ICD-10-CM

## 2012-10-08 DIAGNOSIS — C773 Secondary and unspecified malignant neoplasm of axilla and upper limb lymph nodes: Secondary | ICD-10-CM

## 2012-10-08 DIAGNOSIS — Z5112 Encounter for antineoplastic immunotherapy: Secondary | ICD-10-CM | POA: Diagnosis not present

## 2012-10-08 DIAGNOSIS — Z5111 Encounter for antineoplastic chemotherapy: Secondary | ICD-10-CM | POA: Diagnosis not present

## 2012-10-08 DIAGNOSIS — R000 Tachycardia, unspecified: Secondary | ICD-10-CM | POA: Diagnosis not present

## 2012-10-08 DIAGNOSIS — I499 Cardiac arrhythmia, unspecified: Secondary | ICD-10-CM

## 2012-10-08 LAB — COMPREHENSIVE METABOLIC PANEL (CC13)
AST: 21 U/L (ref 5–34)
Alkaline Phosphatase: 72 U/L (ref 40–150)
BUN: 18 mg/dL (ref 7.0–26.0)
Creatinine: 0.8 mg/dL (ref 0.6–1.1)
Potassium: 3.9 mEq/L (ref 3.5–5.1)
Total Bilirubin: 0.49 mg/dL (ref 0.20–1.20)

## 2012-10-08 LAB — CBC WITH DIFFERENTIAL/PLATELET
BASO%: 0.4 % (ref 0.0–2.0)
EOS%: 1.9 % (ref 0.0–7.0)
HCT: 29.3 % — ABNORMAL LOW (ref 34.8–46.6)
LYMPH%: 11.1 % — ABNORMAL LOW (ref 14.0–49.7)
MCH: 32.5 pg (ref 25.1–34.0)
MCHC: 33.8 g/dL (ref 31.5–36.0)
NEUT%: 78.6 % — ABNORMAL HIGH (ref 38.4–76.8)
lymph#: 0.8 10*3/uL — ABNORMAL LOW (ref 0.9–3.3)

## 2012-10-08 MED ORDER — ONDANSETRON 8 MG/50ML IVPB (CHCC)
8.0000 mg | Freq: Once | INTRAVENOUS | Status: AC
  Administered 2012-10-08: 8 mg via INTRAVENOUS

## 2012-10-08 MED ORDER — DIPHENHYDRAMINE HCL 50 MG/ML IJ SOLN
50.0000 mg | Freq: Once | INTRAMUSCULAR | Status: AC
  Administered 2012-10-08: 50 mg via INTRAVENOUS

## 2012-10-08 MED ORDER — PACLITAXEL CHEMO INJECTION 300 MG/50ML
80.0000 mg/m2 | Freq: Once | INTRAVENOUS | Status: AC
  Administered 2012-10-08: 132 mg via INTRAVENOUS
  Filled 2012-10-08: qty 22

## 2012-10-08 MED ORDER — ACETAMINOPHEN 325 MG PO TABS
650.0000 mg | ORAL_TABLET | Freq: Once | ORAL | Status: AC
  Administered 2012-10-08: 650 mg via ORAL

## 2012-10-08 MED ORDER — DEXAMETHASONE SODIUM PHOSPHATE 4 MG/ML IJ SOLN
20.0000 mg | Freq: Once | INTRAMUSCULAR | Status: AC
  Administered 2012-10-08: 20 mg via INTRAVENOUS

## 2012-10-08 MED ORDER — SODIUM CHLORIDE 0.9 % IJ SOLN
10.0000 mL | INTRAMUSCULAR | Status: DC | PRN
  Administered 2012-10-08: 10 mL
  Filled 2012-10-08: qty 10

## 2012-10-08 MED ORDER — SODIUM CHLORIDE 0.9 % IV SOLN
Freq: Once | INTRAVENOUS | Status: AC
  Administered 2012-10-08: 12:00:00 via INTRAVENOUS

## 2012-10-08 MED ORDER — HEPARIN SOD (PORK) LOCK FLUSH 100 UNIT/ML IV SOLN
500.0000 [IU] | Freq: Once | INTRAVENOUS | Status: AC | PRN
  Administered 2012-10-08: 500 [IU]
  Filled 2012-10-08: qty 5

## 2012-10-08 MED ORDER — DIPHENHYDRAMINE HCL 25 MG PO CAPS: 50.0000 mg | ORAL_CAPSULE | Freq: Once | ORAL | Status: DC

## 2012-10-08 MED ORDER — TRASTUZUMAB CHEMO INJECTION 440 MG
2.0000 mg/kg | Freq: Once | INTRAVENOUS | Status: AC
  Administered 2012-10-08: 126 mg via INTRAVENOUS
  Filled 2012-10-08: qty 6

## 2012-10-08 MED ORDER — FAMOTIDINE IN NACL 20-0.9 MG/50ML-% IV SOLN
20.0000 mg | Freq: Once | INTRAVENOUS | Status: AC
  Administered 2012-10-08: 20 mg via INTRAVENOUS

## 2012-10-08 MED ORDER — MAGIC MOUTHWASH W/LIDOCAINE: 5.0000 mL | Freq: Four times a day (QID) | ORAL | Status: DC | PRN

## 2012-10-08 NOTE — Patient Instructions (Addendum)
[facility] Discharge Instructions for Patients Receiving Chemotherapy  Today you received the following chemotherapy agents Taxol and Herceptin.  To help prevent nausea and vomiting after your treatment, we encourage you to take your nausea medication. Begin taking your nausea medication as often as prescribed for by Dr. Khan.    If you develop nausea and vomiting that is not controlled by your nausea medication, call the clinic. If it is after clinic hours your family physician or the after hours number for the clinic or go to the Emergency Department.   BELOW ARE SYMPTOMS THAT SHOULD BE REPORTED IMMEDIATELY:  *FEVER GREATER THAN 100.5 F  *CHILLS WITH OR WITHOUT FEVER  NAUSEA AND VOMITING THAT IS NOT CONTROLLED WITH YOUR NAUSEA MEDICATION  *UNUSUAL SHORTNESS OF BREATH  *UNUSUAL BRUISING OR BLEEDING  TENDERNESS IN MOUTH AND THROAT WITH OR WITHOUT PRESENCE OF ULCERS  *URINARY PROBLEMS  *BOWEL PROBLEMS  UNUSUAL RASH Items with * indicate a potential emergency and should be followed up as soon as possible.  One of the nurses will contact you 24 hours after your treatment. Please let the nurse know about any problems that you may have experienced. Feel free to call the clinic you have any questions or concerns. The clinic phone number is [phone].   I have been informed and understand all the instructions given to me. I know to contact the clinic, my physician, or go to the Emergency Department if any problems should occur. I do not have any questions at this time, but understand that I may call the clinic during office hours   should I have any questions or need assistance in obtaining follow up care.    __________________________________________  _____________  __________ Signature of Patient or Authorized Representative            Date                   Time    __________________________________________ Nurse's Signature    

## 2012-10-08 NOTE — Progress Notes (Signed)
OFFICE PROGRESS NOTE  CC  Jessica Alken, MD [address] [place] [address]  DIAGNOSIS: 66 year old female with new diagnosis of inflammatory breast cancer  PRIOR THERAPY: #1 patient was originally seen in my clinic for new diagnosis of inflammatory breast cancer she was referred by Dr. Emelia Loron. She had a mammogram performed That showed an abnormality. That was biopsied and it showed an invasive mammary carcinoma with lymphovascular invasion grade 3 ER negative PR negative HER-2/neu positive.  #2 patient has gone on to have MRI of the breasts performed the MRI shows diffuse right breast neoplasm on a large level I right axillary lymph nodes compatible with lymphatic spread.Patient has had a biopsy of the right axillary lymph node that is compatible with invasive mammary carcinoma.  #3 patient  began FEC 100 with day 2 Neulasta on 07/23/2012  #4 Weekly Taxol and Herceptin starting 09/17/12  CURRENT THERAPY: Cycle 4 Taxol and Herceptin  INTERVAL HISTORY: Jessica Wells 66 y.o. female returns for chemotherapy today. She has tolerated her previous cycles well.  She has been having some tachycardia and per her discussion with Dr. Welton Flakes last week, she has stopped her decadron.  She has increased mouth pain since this, and also has not had cardiology call her to set up an appointment yet.  Since stopping the Decadron she feels better from a tachycardia standpoint.  Otherwise, she is w/o questions/concerns.     MEDICAL HISTORY: Past Medical History  Diagnosis Date  . Asthma   . GERD (gastroesophageal reflux disease)   . Dysrhythmia     PAT-sees dr Laurence Compton meds  . Breast cancer     inflammatory right breast ca  . Allergy   . PAT (paroxysmal atrial tachycardia)     hx  . Arthritis     osteopenia,knees  . PONV (postoperative nausea and vomiting) 09-13-12    severe, with Port-a-cath, was managed without PONV    ALLERGIES:  is allergic to anesthetics, amide;  bactrim; sulfa antibiotics; codeine; codeine phosphate; and eggs or egg-derived products.  MEDICATIONS:  Current Outpatient Prescriptions  Medication Sig Dispense Refill  . albuterol (PROVENTIL HFA;VENTOLIN HFA) 108 (90 BASE) MCG/ACT inhaler Inhale 2 puffs into the lungs every 6 (six) hours as needed.      Marland Kitchen aspirin EC 81 MG tablet Take 162 mg by mouth every 6 (six) hours as needed. For blood clot symptoms      . calcium-vitamin D (OSCAL WITH D) 500-200 MG-UNIT per tablet Take 1 tablet by mouth daily.      . ciprofloxacin (CIPRO) 500 MG tablet Take 500 mg by mouth 2 (two) times daily.      Marland Kitchen dexamethasone (DECADRON) 4 MG tablet Take 8 mg by mouth 2 (two) times daily with a meal. Take two tablets twice a day the day before chemo, then two tablets twice a day for three days after chemo.      Marland Kitchen dexamethasone (DECADRON) 4 MG tablet Take 2 tablets (8 mg total) by mouth 2 (two) times daily with a meal. Take daily starting the day after chemotherapy for 2 days. Take with food.  30 tablet  1  . fish oil-omega-3 fatty acids 1000 MG capsule Take 2 g by mouth daily.      . Flaxseed, Linseed, (FLAX SEEDS PO) Take 1 tablet by mouth daily.      . fluticasone (FLONASE) 50 MCG/ACT nasal spray Place 1 spray into the nose daily as needed. For allergies      .  glucosamine-chondroitin 500-400 MG tablet Take 1 tablet by mouth daily.      Marland Kitchen HYDROcodone-acetaminophen (NORCO/VICODIN) 5-325 MG per tablet Take 1 tablet by mouth every 6 (six) hours as needed. Pain      . lidocaine-prilocaine (EMLA) cream Apply topically as needed.  30 g  5  . LORazepam (ATIVAN) 0.5 MG tablet Take 1 tablet (0.5 mg total) by mouth every 6 (six) hours as needed (Nausea or vomiting).  30 tablet  0  . Multiple Vitamin (MULTIVITAMIN WITH MINERALS) TABS Take 1 tablet by mouth daily.      Marland Kitchen omeprazole (PRILOSEC) 20 MG capsule Take 20 mg by mouth daily as needed. For heartburn      . ondansetron (ZOFRAN) 8 MG tablet Take 1 tablet (8 mg total) by  mouth 2 (two) times daily. Take two times a day starting the day after chemo for 2 days. Then take two times a day as needed for nausea or vomiting.  30 tablet  1  . oxyCODONE-acetaminophen (PERCOCET/ROXICET) 5-325 MG per tablet       . oxyCODONE-acetaminophen (ROXICET) 5-325 MG per tablet Take 1 tablet by mouth every 4 (four) hours as needed for pain.  15 tablet  0  . prochlorperazine (COMPAZINE) 10 MG tablet Take 1 tablet (10 mg total) by mouth every 6 (six) hours as needed (Nausea or vomiting).  30 tablet  1  . prochlorperazine (COMPAZINE) 25 MG suppository Place 1 suppository (25 mg total) rectally every 12 (twelve) hours as needed for nausea.  12 suppository  3  . valACYclovir (VALTREX) 500 MG tablet Take 1 tablet (500 mg total) by mouth 3 (three) times daily.  90 tablet  6    SURGICAL HISTORY:  Past Surgical History  Procedure Date  . Dilation and curettage of uterus     x3  . Tonsillectomy   . Colonoscopy   . Portacath placement 07/21/2012    Procedure: INSERTION PORT-A-CATH;  Surgeon: Emelia Loron, MD;  Location: [facility];  Service: General;  Laterality: Left;    REVIEW OF SYSTEMS:   General: fatigue (+), night sweats (-), fever (-), pain (+) Lymph: palpable nodes (-) HEENT: vision changes (-), mucositis (-), gum bleeding (-), epistaxis (-) Cardiovascular: chest pain (-), palpitations (-) Pulmonary: shortness of breath (-), dyspnea on exertion (-), cough (-), hemoptysis (-) GI:  Early satiety (-), melena (-), dysphagia (-), nausea/vomiting (-), diarrhea (-) GU: dysuria (-), hematuria (-), incontinence (-) Musculoskeletal: joint swelling (-), joint pain (-), back pain (-) Neuro: weakness (-), numbness (-), headache (-), confusion (-) Skin: Rash (-), lesions (-), dryness (-) Psych: depression (-), suicidal/homicidal ideation (-), feeling of hopelessness (-)   PHYSICAL EXAMINATION:  BP 143/74  Pulse 89  Temp 98.2 F (36.8 C)  Resp 20  Ht 5\' 1"  (1.549  m)  Wt 143 lb 6.4 oz (65.046 kg)  BMI 27.10 kg/m2 General: Patient is a well appearing female in no acute distress HEENT: PERRLA, sclerae anicteric no conjunctival pallor, MMM Neck: supple, no palpable adenopathy Lungs: clear to auscultation bilaterally, no wheezes, rhonchi, or rales Cardiovascular: regular rate rhythm, S1, S2, no murmurs, rubs or gallops Abdomen: Soft, non-tender, non-distended, normoactive bowel sounds, no HSM Extremities: warm and well perfused, no clubbing, cyanosis, or edema Skin: No rashes or lesions Neuro: Non-focal Right breast reveals grossly enlarged breast with Dimpling of the skin And retraction of the nipple, improving ECOG PERFORMANCE STATUS: 0 - Asymptomatic   LABORATORY DATA: Lab Results  Component Value Date  WBC 7.4 10/08/2012   HGB 9.9* 10/08/2012   HCT 29.3* 10/08/2012   MCV 96.1 10/08/2012   PLT 144* 10/08/2012      Chemistry      Component Value Date/Time   NA 136 10/01/2012 1015   NA 137 09/15/2012 1330   K 3.7 10/01/2012 1015   K 3.4* 09/15/2012 1330   CL 104 10/01/2012 1015   CL 100 09/15/2012 1330   CO2 27 10/01/2012 1015   CO2 27 09/15/2012 1330   BUN 20.0 10/01/2012 1015   BUN 9 09/15/2012 1330   CREATININE 0.8 10/01/2012 1015   CREATININE 0.66 09/15/2012 1330      Component Value Date/Time   CALCIUM 9.4 10/01/2012 1015   CALCIUM 9.3 09/15/2012 1330   ALKPHOS 77 10/01/2012 1015   ALKPHOS 96 09/10/2012 1401   AST 16 10/01/2012 1015   AST 12 09/10/2012 1401   ALT 39 10/01/2012 1015   ALT 16 09/10/2012 1401   BILITOT 0.51 10/01/2012 1015   BILITOT 0.9 09/10/2012 1401       RADIOGRAPHIC STUDIES:  ASSESSMENT: 66 year old female with  #1 inflammatory right breast cancer with positive lymph nodes the tumor is ER negative PR negative HER-2/neu positive. Patient is  to receive neoadjuvant chemotherapy intially Consisting of FEC q 2 weeks x 4 cycles. Then she will receive taxol and herceptin q week x 12 weeks.Once patient completes her  chemotherapy then we will get restaging MRIs performed to evaluate response to therapy. Patient does understand that she will most likely need a mastectomy because she does have inflammatory breast cancer.  #2 Tachycardia  PLAN:   #1 Ms. Featherston continues to do well.  She will receive her chemotherapy today.  I recommended her chew ice, or a popsicle while receiving chemotherapy.  I have also ordered magic mouthwash for her mouth pain.    #2 Ms. Botting does continues to have tachycardia.  This is improved since stopping her Decadron.  I will place an order for her to be evaluated by Dr. Gala Romney.    All questions were answered. The patient knows to call the clinic with any problems, questions or concerns. We can certainly see the patient much sooner if necessary.  I spent 25 minutes counseling the patient face to face. The total time spent in the appointment was 30 minutes.  This case was reviewed with Dr. Welton Flakes.  Cherie Ouch Lyn Hollingshead, NP Medical Oncology [facility] Phone: [phone]

## 2012-10-08 NOTE — Telephone Encounter (Signed)
gve the pt an appt with dr benshimon for next week 10/14/2013@[facility].

## 2012-10-08 NOTE — Patient Instructions (Addendum)
Doing well.  Continue not taking the Decadron.  We will schedule you with Dr. Gala Romney.  Please call us if you have any questions or concerns.   We will see you back next week.

## 2012-10-11 ENCOUNTER — Other Ambulatory Visit: Payer: Self-pay | Admitting: Certified Registered Nurse Anesthetist

## 2012-10-11 ENCOUNTER — Encounter: Payer: Self-pay | Admitting: Oncology

## 2012-10-11 ENCOUNTER — Encounter: Payer: Self-pay | Admitting: *Deleted

## 2012-10-11 NOTE — Progress Notes (Signed)
Put compound drug pa form on nurse's desk; I do not know what a compound drug is.

## 2012-10-11 NOTE — Progress Notes (Signed)
RECEIVED A FAX FROM [facility] CONCERNING A PRIOR AUTHORIZATION FOR A COMPOUND DRUG. THIS REQUEST WAS PLACED IN THE MANAGED CARE WORK BIN.

## 2012-10-13 NOTE — Progress Notes (Signed)
OFFICE PROGRESS NOTE  CC  [name], MD [address] [place] [address]  DIAGNOSIS: 66 year old female with new diagnosis of inflammatory breast cancer  PRIOR THERAPY: #1 patient was originally seen in my clinic for new diagnosis of inflammatory breast cancer she was referred by Dr. Emelia Loron. She had a mammogram performed That showed an abnormality. That was biopsied and it showed an invasive mammary carcinoma with lymphovascular invasion grade 3 ER negative PR negative HER-2/neu positive.  #2 patient has gone on to have MRI of the breasts performed the MRI shows diffuse right breast neoplasm on a large level I right axillary lymph nodes compatible with lymphatic spread.Patient has had a biopsy of the right axillary lymph node that is compatible with invasive mammary carcinoma.  #3 patient  began FEC 100 with day 2 Neulasta on 07/23/2012  #4 Weekly Taxol and Herceptin starting 09/17/12  CURRENT THERAPY: Cycle 2 Taxol and Herceptin  INTERVAL HISTORY: Remedios Mckone 66 y.o. female returns for chemotherapy today. She started her first cycle of Taxol and Herceptin last Friday.  She did well with this.  She did not have any ill effects other than fatigue on Tues/Wed.  Otherwise she is doing good.  Remainder of the 10 point review of systems is unremarkable    MEDICAL HISTORY: Past Medical History  Diagnosis Date  . Asthma   . GERD (gastroesophageal reflux disease)   . Dysrhythmia     PAT-sees dr Laurence Compton meds  . Breast cancer     inflammatory right breast ca  . Allergy   . PAT (paroxysmal atrial tachycardia)     hx  . Arthritis     osteopenia,knees  . PONV (postoperative nausea and vomiting) 09-13-12    severe, with Port-a-cath, was managed without PONV    ALLERGIES:  is allergic to anesthetics, amide; bactrim; sulfa antibiotics; codeine; codeine phosphate; and eggs or egg-derived products.  MEDICATIONS:  Current Outpatient Prescriptions    Medication Sig Dispense Refill  . albuterol (PROVENTIL HFA;VENTOLIN HFA) 108 (90 BASE) MCG/ACT inhaler Inhale 2 puffs into the lungs every 6 (six) hours as needed.      Marland Kitchen dexamethasone (DECADRON) 4 MG tablet Take 8 mg by mouth 2 (two) times daily with a meal. Take two tablets twice a day the day before chemo, then two tablets twice a day for three days after chemo.      Marland Kitchen dexamethasone (DECADRON) 4 MG tablet Take 2 tablets (8 mg total) by mouth 2 (two) times daily with a meal. Take daily starting the day after chemotherapy for 2 days. Take with food.  30 tablet  1  . fish oil-omega-3 fatty acids 1000 MG capsule Take 2 g by mouth daily.      Marland Kitchen glucosamine-chondroitin 500-400 MG tablet Take 1 tablet by mouth daily.      Marland Kitchen HYDROcodone-acetaminophen (NORCO/VICODIN) 5-325 MG per tablet Take 1 tablet by mouth every 6 (six) hours as needed. Pain      . lidocaine-prilocaine (EMLA) cream Apply topically as needed.  30 g  5  . LORazepam (ATIVAN) 0.5 MG tablet Take 1 tablet (0.5 mg total) by mouth every 6 (six) hours as needed (Nausea or vomiting).  30 tablet  0  . Multiple Vitamin (MULTIVITAMIN WITH MINERALS) TABS Take 1 tablet by mouth daily.      Marland Kitchen omeprazole (PRILOSEC) 20 MG capsule Take 20 mg by mouth daily as needed. For heartburn      . ondansetron (ZOFRAN) 8 MG tablet Take  1 tablet (8 mg total) by mouth 2 (two) times daily. Take two times a day starting the day after chemo for 2 days. Then take two times a day as needed for nausea or vomiting.  30 tablet  1  . oxyCODONE-acetaminophen (PERCOCET/ROXICET) 5-325 MG per tablet       . oxyCODONE-acetaminophen (ROXICET) 5-325 MG per tablet Take 1 tablet by mouth every 4 (four) hours as needed for pain.  15 tablet  0  . prochlorperazine (COMPAZINE) 10 MG tablet Take 1 tablet (10 mg total) by mouth every 6 (six) hours as needed (Nausea or vomiting).  30 tablet  1  . prochlorperazine (COMPAZINE) 25 MG suppository Place 1 suppository (25 mg total) rectally every  12 (twelve) hours as needed for nausea.  12 suppository  3  . valACYclovir (VALTREX) 500 MG tablet Take 1 tablet (500 mg total) by mouth 3 (three) times daily.  90 tablet  6  . Alum & Mag Hydroxide-Simeth (MAGIC MOUTHWASH W/LIDOCAINE) SOLN Take 5 mLs by mouth 4 (four) times daily as needed.  60 mL  0  . aspirin EC 81 MG tablet Take 162 mg by mouth every 6 (six) hours as needed. For blood clot symptoms      . calcium-vitamin D (OSCAL WITH D) 500-200 MG-UNIT per tablet Take 1 tablet by mouth daily.      . ciprofloxacin (CIPRO) 500 MG tablet Take 500 mg by mouth 2 (two) times daily.      . Flaxseed, Linseed, (FLAX SEEDS PO) Take 1 tablet by mouth daily.      . fluticasone (FLONASE) 50 MCG/ACT nasal spray Place 1 spray into the nose daily as needed. For allergies        SURGICAL HISTORY:  Past Surgical History  Procedure Date  . Dilation and curettage of uterus     x3  . Tonsillectomy   . Colonoscopy   . Portacath placement 07/21/2012    Procedure: INSERTION PORT-A-CATH;  Surgeon: Emelia Loron, MD;  Location: [facility];  Service: General;  Laterality: Left;    REVIEW OF SYSTEMS:   General: fatigue (+), night sweats (-), fever (-), pain (-) Lymph: palpable nodes (-) HEENT: vision changes (-), mucositis (-), gum bleeding (-), epistaxis (-) Cardiovascular: chest pain (-), palpitations (-) Pulmonary: shortness of breath (-), dyspnea on exertion (-), cough (-), hemoptysis (-) GI:  Early satiety (-), melena (-), dysphagia (-), nausea/vomiting (-), diarrhea (-) GU: dysuria (-), hematuria (-), incontinence (-) Musculoskeletal: joint swelling (-), joint pain (-), back pain (-) Neuro: weakness (-), numbness (-), headache (-), confusion (-) Skin: Rash (-), lesions (-), dryness (-) Psych: depression (-), suicidal/homicidal ideation (-), feeling of hopelessness (-)   PHYSICAL EXAMINATION:  BP 132/73  Pulse 98  Temp 98.4 F (36.9 C) (Oral)  Resp 20  Ht 5\' 1"  (1.549 m)  Wt  139 lb (63.05 kg)  BMI 26.26 kg/m2 General: Patient is a well appearing female in no acute distress HEENT: PERRLA, sclerae anicteric no conjunctival pallor, MMM Neck: supple, no palpable adenopathy Lungs: clear to auscultation bilaterally, no wheezes, rhonchi, or rales Cardiovascular: regular rate rhythm, S1, S2, no murmurs, rubs or gallops Abdomen: Soft, non-tender, non-distended, normoactive bowel sounds, no HSM Extremities: warm and well perfused, no clubbing, cyanosis, or edema Skin: No rashes or lesions Neuro: Non-focal Right breast reveals grossly enlarged breast with Dimpling of the skin And retraction of the nipple, improving ECOG PERFORMANCE STATUS: 0 - Asymptomatic   LABORATORY DATA: Lab Results  Component Value Date   WBC 7.4 10/08/2012   HGB 9.9* 10/08/2012   HCT 29.3* 10/08/2012   MCV 96.1 10/08/2012   PLT 144* 10/08/2012      Chemistry      Component Value Date/Time   NA 137 10/08/2012 1042   NA 137 09/15/2012 1330   K 3.9 10/08/2012 1042   K 3.4* 09/15/2012 1330   CL 105 10/08/2012 1042   CL 100 09/15/2012 1330   CO2 26 10/08/2012 1042   CO2 27 09/15/2012 1330   BUN 18.0 10/08/2012 1042   BUN 9 09/15/2012 1330   CREATININE 0.8 10/08/2012 1042   CREATININE 0.66 09/15/2012 1330      Component Value Date/Time   CALCIUM 9.0 10/08/2012 1042   CALCIUM 9.3 09/15/2012 1330   ALKPHOS 72 10/08/2012 1042   ALKPHOS 96 09/10/2012 1401   AST 21 10/08/2012 1042   AST 12 09/10/2012 1401   ALT 39 10/08/2012 1042   ALT 16 09/10/2012 1401   BILITOT 0.49 10/08/2012 1042   BILITOT 0.9 09/10/2012 1401       RADIOGRAPHIC STUDIES:  ASSESSMENT: 66 year old female with  #1 inflammatory right breast cancer with positive lymph nodes the tumor is ER negative PR negative HER-2/neu positive. Patient is  to receive neoadjuvant chemotherapy intially Consisting of FEC q 2 weeks x 4 cycles. Then she will receive taxol and herceptin q week x 12 weeks.Once patient completes her chemotherapy  then we will get restaging MRIs performed to evaluate response to therapy. Patient does understand that she will most likely need a mastectomy because she does have inflammatory breast cancer.  #2 Tachycardia  #3 anemia due to chemotherapy  PLAN:   #1 Ms. Rufener is doing well today. She is feeling well with her Taxol and Herceptin.  She is ready for her next cycle today.  Her labs look good.    #2 Ms. Danielsen does occasionally have tachycardia that she associates with being dehydrated.  Her HR is 83 today.  We will give her IVF today with her chemo.  It helps her with her dehydration and tachycardia.    All questions were answered. The patient knows to call the clinic with any problems, questions or concerns. We can certainly see the patient much sooner if necessary.  I spent 25 minutes counseling the patient face to face. The total time spent in the appointment was 30 minutes.   Drue Second, MD Medical/Oncology [facility] [phone] (beeper) [phone] (Office)

## 2012-10-14 ENCOUNTER — Ambulatory Visit (HOSPITAL_COMMUNITY)
Admission: RE | Admit: 2012-10-14 | Discharge: 2012-10-14 | Disposition: A | Discharge status: Home / Self Care | Payer: Medicare Other | Source: Ambulatory Visit | Attending: Internal Medicine | Admitting: Internal Medicine

## 2012-10-14 VITALS — BP 148/56 | HR 83 | Wt 144.8 lb

## 2012-10-14 DIAGNOSIS — I471 Supraventricular tachycardia, unspecified: Secondary | ICD-10-CM | POA: Insufficient documentation

## 2012-10-14 DIAGNOSIS — C50119 Malignant neoplasm of central portion of unspecified female breast: Secondary | ICD-10-CM | POA: Diagnosis not present

## 2012-10-14 MED ORDER — METOPROLOL SUCCINATE ER 50 MG PO TB24: 50.0000 mg | ORAL_TABLET | Freq: Every day | ORAL | Status: DC

## 2012-10-14 NOTE — Progress Notes (Signed)
Patient ID: Jessica Wells, female   DOB: [birth year], 66 y.o.   MRN: [id number] Oncologist: Dr Welton Flakes General Surgeon: Dr Dwain Sarna HPI: 66 year old female with a history of tachycardia, inflammatory right breast cancer with positive lymph nodes the tumor is ER negative PR negative HER-2/neu positive. Plans to receive neoadjuvant chemotherapy intially consisting of FEC q 2 weeks x 4 cycles. She is now completing taxol and herceptin q week x 12 weeks.  07/22/12 ECHO EF 60-65%  She returns for follow up per Dr Milta Deiters request due to tachycardia. She has a h/o PSVT in remote past. She developed frequent tachypalpitations after 1st round of Taxol with heart rates near 200 bpm.. She also noticed tachycardia with exertion. Last week Dr Welton Flakes stopped decadron. Palpitations essentially resolved.  Compliant with medications. Continues to walk 20 minutes per day and work [place] without symptoms.    Past Medical History  Diagnosis Date  . Asthma   . GERD (gastroesophageal reflux disease)   . Dysrhythmia     PAT-sees dr Laurence Compton meds  . Breast cancer     inflammatory right breast ca  . Allergy   . PAT (paroxysmal atrial tachycardia)     hx  . Arthritis     osteopenia,knees  . PONV (postoperative nausea and vomiting) 09-13-12    severe, with Port-a-cath, was managed without PONV    Current Outpatient Prescriptions  Medication Sig Dispense Refill  . albuterol (PROVENTIL HFA;VENTOLIN HFA) 108 (90 BASE) MCG/ACT inhaler Inhale 2 puffs into the lungs every 6 (six) hours as needed.      . Alum & Mag Hydroxide-Simeth (MAGIC MOUTHWASH W/LIDOCAINE) SOLN Take 5 mLs by mouth 4 (four) times daily as needed.  60 mL  0  . aspirin EC 81 MG tablet Take 162 mg by mouth every 6 (six) hours as needed. For blood clot symptoms      . calcium-vitamin D (OSCAL WITH D) 500-200 MG-UNIT per tablet Take 1 tablet by mouth daily.      . ciprofloxacin (CIPRO) 500 MG tablet Take 500 mg by mouth 2 (two) times daily.  As needed      . fish oil-omega-3 fatty acids 1000 MG capsule Take 2 g by mouth daily.      . Flaxseed, Linseed, (FLAX SEEDS PO) Take 1 tablet by mouth daily.      . fluticasone (FLONASE) 50 MCG/ACT nasal spray Place 1 spray into the nose daily as needed. For allergies      . glucosamine-chondroitin 500-400 MG tablet Take 1 tablet by mouth daily.      Marland Kitchen HYDROcodone-acetaminophen (NORCO/VICODIN) 5-325 MG per tablet Take 1 tablet by mouth every 6 (six) hours as needed. Pain      . lidocaine-prilocaine (EMLA) cream Apply topically as needed.  30 g  5  . LORazepam (ATIVAN) 0.5 MG tablet Take 1 tablet (0.5 mg total) by mouth every 6 (six) hours as needed (Nausea or vomiting).  30 tablet  0  . Multiple Vitamin (MULTIVITAMIN WITH MINERALS) TABS Take 1 tablet by mouth daily.      Marland Kitchen omeprazole (PRILOSEC) 20 MG capsule Take 20 mg by mouth daily as needed. For heartburn      . ondansetron (ZOFRAN) 8 MG tablet Take 1 tablet (8 mg total) by mouth 2 (two) times daily. Take two times a day starting the day after chemo for 2 days. Then take two times a day as needed for nausea or vomiting.  30 tablet  1  .  oxyCODONE-acetaminophen (ROXICET) 5-325 MG per tablet Take 1 tablet by mouth every 4 (four) hours as needed for pain.  15 tablet  0  . prochlorperazine (COMPAZINE) 10 MG tablet Take 1 tablet (10 mg total) by mouth every 6 (six) hours as needed (Nausea or vomiting).  30 tablet  1  . prochlorperazine (COMPAZINE) 25 MG suppository Place 1 suppository (25 mg total) rectally every 12 (twelve) hours as needed for nausea.  12 suppository  3  . valACYclovir (VALTREX) 500 MG tablet Take 1 tablet (500 mg total) by mouth 3 (three) times daily.  90 tablet  6  . dexamethasone (DECADRON) 4 MG tablet Take 8 mg by mouth 2 (two) times daily with a meal. Take two tablets twice a day the day before chemo, then two tablets twice a day for three days after chemo.      Marland Kitchen dexamethasone (DECADRON) 4 MG tablet Take 2 tablets (8 mg total)  by mouth 2 (two) times daily with a meal. Take daily starting the day after chemotherapy for 2 days. Take with food.  30 tablet  1  . metoprolol succinate (TOPROL XL) 50 MG 24 hr tablet Take 1 tablet (50 mg total) by mouth daily. Take with or immediately following a meal.  30 tablet  3     Allergies  Allergen Reactions  . Anesthetics, Amide Nausea And Vomiting    Projectile vomitting-Nausea- with 24hrs of dry heaves. With any anesthetics  . Bactrim (Sulfamethoxazole W-Trimethoprim) Other (See Comments)    Massive diarrhea, nausea vomiting, platelets dropped, hemoglobin dropped, admitted to hospital  . Sulfa Antibiotics Nausea And Vomiting and Other (See Comments)    Crazy feeling in head Life threatening reaction, decreased blood counts  . Codeine Nausea And Vomiting  . Codeine Phosphate Nausea And Vomiting  . Eggs Or Egg-Derived Products Hives    History   Social History  . Marital Status: Single    Spouse Name: N/A    Number of Children: 1  . Years of Education: N/A   Occupational History  .      retired Hydrographic surveyor   Social History Main Topics  . Smoking status: Former Smoker -- 35 years    Quit date: 07/21/1979  . Smokeless tobacco: Never Used  . Alcohol Use: No  . Drug Use: No  . Sexually Active: Not Currently   Other Topics Concern  . Not on file   Social History Narrative  . No narrative on file    Family History  Problem Relation Age of Onset  . Diabetes Mother   . Heart disease Mother   . Hypertension Mother   . Hyperlipidemia Mother   . Breast cancer Mother 32    lobular breast cancer  . Heart disease Maternal Aunt   . Heart disease Maternal Uncle   . Heart disease Maternal Grandmother     died in her 66s from heart disease  . Lung cancer Father 40    adenocarcinoma  . Cancer Paternal Uncle     dx in mid 47s with a cancer in the leg, died late 81s; grandpaternal half uncle    PHYSICAL EXAM: Filed Vitals:   10/14/12 1059  BP: 148/56  Pulse:  83  rmal General:  Well appearing. No respiratory difficulty HEENT: no Neck: supple. no JVD. Carotids 2+ bilat; no bruits. No lymphadenopathy or thryomegaly appreciated. Cor: PMI nondisplaced. Regular rate & rhythm. No rubs, gallops or murmurs. Lungs: clear Abdomen: soft, nontender, nondistended. No hepatosplenomegaly. No bruits or  masses. Good bowel sounds. Extremities: no cyanosis, clubbing, rash, edema Neuro: alert & oriented x 3, cranial nerves grossly intact. moves all 4 extremities w/o difficulty. Affect pleasant.  ECG: NSR 87 bpm. No pre-excitation    ASSESSMENT & PLAN:

## 2012-10-14 NOTE — Assessment & Plan Note (Signed)
I suspect she is having PSVT related to her chemo and steroids. We discussed the treatment option of watchful waiting, anti-arrhythmic medication or ablation. I told her that we should not let her PSVT interfere with aggressive therapy for her breast CA. We will start Toprol 50mg  daily and place event monitor. If she has significant amount of SVT I would favor ablation. Check echo.

## 2012-10-14 NOTE — Patient Instructions (Addendum)
Take Toprol XL  50 mg daily  If you develop tachycardia again. Please call HF clinic [phone].   We will order an event monitor for you.   Follow up in 1 month with and ECHO and  Dr Gala Romney in 2 weeks.

## 2012-10-15 ENCOUNTER — Other Ambulatory Visit (HOSPITAL_BASED_OUTPATIENT_CLINIC_OR_DEPARTMENT_OTHER): Payer: Medicare Other | Admitting: Lab

## 2012-10-15 ENCOUNTER — Telehealth: Payer: Self-pay | Admitting: Oncology

## 2012-10-15 ENCOUNTER — Encounter: Payer: Self-pay | Admitting: Adult Health

## 2012-10-15 ENCOUNTER — Ambulatory Visit (HOSPITAL_BASED_OUTPATIENT_CLINIC_OR_DEPARTMENT_OTHER): Payer: Medicare Other

## 2012-10-15 ENCOUNTER — Ambulatory Visit (HOSPITAL_BASED_OUTPATIENT_CLINIC_OR_DEPARTMENT_OTHER): Payer: Medicare Other | Admitting: Adult Health

## 2012-10-15 VITALS — BP 117/71 | HR 111 | Temp 98.6°F | Resp 20 | Ht 61.0 in | Wt 143.9 lb

## 2012-10-15 DIAGNOSIS — C50119 Malignant neoplasm of central portion of unspecified female breast: Secondary | ICD-10-CM | POA: Diagnosis not present

## 2012-10-15 DIAGNOSIS — Z171 Estrogen receptor negative status [ER-]: Secondary | ICD-10-CM | POA: Diagnosis not present

## 2012-10-15 DIAGNOSIS — Z5112 Encounter for antineoplastic immunotherapy: Secondary | ICD-10-CM | POA: Diagnosis not present

## 2012-10-15 DIAGNOSIS — C50919 Malignant neoplasm of unspecified site of unspecified female breast: Secondary | ICD-10-CM

## 2012-10-15 DIAGNOSIS — C773 Secondary and unspecified malignant neoplasm of axilla and upper limb lymph nodes: Secondary | ICD-10-CM

## 2012-10-15 DIAGNOSIS — R000 Tachycardia, unspecified: Secondary | ICD-10-CM | POA: Diagnosis not present

## 2012-10-15 DIAGNOSIS — Z5111 Encounter for antineoplastic chemotherapy: Secondary | ICD-10-CM

## 2012-10-15 LAB — CBC WITH DIFFERENTIAL/PLATELET
BASO%: 0.6 % (ref 0.0–2.0)
Basophils Absolute: 0 10*3/uL (ref 0.0–0.1)
EOS%: 1.8 % (ref 0.0–7.0)
HGB: 10.4 g/dL — ABNORMAL LOW (ref 11.6–15.9)
MCH: 32.9 pg (ref 25.1–34.0)
RDW: 17.6 % — ABNORMAL HIGH (ref 11.2–14.5)
lymph#: 0.8 10*3/uL — ABNORMAL LOW (ref 0.9–3.3)
nRBC: 0 % (ref 0–0)

## 2012-10-15 LAB — COMPREHENSIVE METABOLIC PANEL (CC13)
Alkaline Phosphatase: 68 U/L (ref 40–150)
BUN: 17 mg/dL (ref 7.0–26.0)
Glucose: 162 mg/dl — ABNORMAL HIGH (ref 70–99)
Sodium: 138 mEq/L (ref 136–145)
Total Bilirubin: 0.47 mg/dL (ref 0.20–1.20)
Total Protein: 5.8 g/dL — ABNORMAL LOW (ref 6.4–8.3)

## 2012-10-15 MED ORDER — FAMOTIDINE IN NACL 20-0.9 MG/50ML-% IV SOLN
20.0000 mg | Freq: Once | INTRAVENOUS | Status: AC
  Administered 2012-10-15: 20 mg via INTRAVENOUS

## 2012-10-15 MED ORDER — SODIUM CHLORIDE 0.9 % IV SOLN
Freq: Once | INTRAVENOUS | Status: AC
  Administered 2012-10-15: 13:00:00 via INTRAVENOUS

## 2012-10-15 MED ORDER — SODIUM CHLORIDE 0.9 % IJ SOLN
10.0000 mL | INTRAMUSCULAR | Status: DC | PRN
  Administered 2012-10-15: 10 mL
  Filled 2012-10-15: qty 10

## 2012-10-15 MED ORDER — ONDANSETRON 8 MG/50ML IVPB (CHCC)
8.0000 mg | Freq: Once | INTRAVENOUS | Status: AC
  Administered 2012-10-15: 8 mg via INTRAVENOUS

## 2012-10-15 MED ORDER — DEXAMETHASONE SODIUM PHOSPHATE 4 MG/ML IJ SOLN
20.0000 mg | Freq: Once | INTRAMUSCULAR | Status: AC
  Administered 2012-10-15: 20 mg via INTRAVENOUS

## 2012-10-15 MED ORDER — SODIUM CHLORIDE 0.9 % IV SOLN: Freq: Once | INTRAVENOUS | Status: DC

## 2012-10-15 MED ORDER — HEPARIN SOD (PORK) LOCK FLUSH 100 UNIT/ML IV SOLN
500.0000 [IU] | Freq: Once | INTRAVENOUS | Status: AC | PRN
  Administered 2012-10-15: 500 [IU]
  Filled 2012-10-15: qty 5

## 2012-10-15 MED ORDER — PACLITAXEL CHEMO INJECTION 300 MG/50ML
80.0000 mg/m2 | Freq: Once | INTRAVENOUS | Status: AC
  Administered 2012-10-15: 132 mg via INTRAVENOUS
  Filled 2012-10-15: qty 22

## 2012-10-15 MED ORDER — TRASTUZUMAB CHEMO INJECTION 440 MG
2.0000 mg/kg | Freq: Once | INTRAVENOUS | Status: AC
  Administered 2012-10-15: 126 mg via INTRAVENOUS
  Filled 2012-10-15: qty 6

## 2012-10-15 MED ORDER — ACETAMINOPHEN 325 MG PO TABS
650.0000 mg | ORAL_TABLET | Freq: Once | ORAL | Status: AC
  Administered 2012-10-15: 650 mg via ORAL

## 2012-10-15 MED ORDER — DIPHENHYDRAMINE HCL 50 MG/ML IJ SOLN
50.0000 mg | Freq: Once | INTRAMUSCULAR | Status: AC
  Administered 2012-10-15: 50 mg via INTRAVENOUS

## 2012-10-15 NOTE — Progress Notes (Signed)
OFFICE PROGRESS NOTE  CC  [name], MD [address] [place] [address]  DIAGNOSIS: 66 year old female with new diagnosis of inflammatory breast cancer  PRIOR THERAPY: #1 patient was originally seen in my clinic for new diagnosis of inflammatory breast cancer she was referred by Dr. Emelia Loron. She had a mammogram performed That showed an abnormality. That was biopsied and it showed an invasive mammary carcinoma with lymphovascular invasion grade 3 ER negative PR negative HER-2/neu positive.  #2 patient has gone on to have MRI of the breasts performed the MRI shows diffuse right breast neoplasm on a large level I right axillary lymph nodes compatible with lymphatic spread.Patient has had a biopsy of the right axillary lymph node that is compatible with invasive mammary carcinoma.  #3 patient  began FEC 100 with day 2 Neulasta on 07/23/2012  #4 Weekly Taxol and Herceptin starting 09/17/12  CURRENT THERAPY: Cycle 5 Taxol and Herceptin  INTERVAL HISTORY: Carl Habte 66 y.o. female returns for chemotherapy today. She is doing well.  She saw Dr. Gala Romney and he started her on Toprol XL for her tachycardia.  He will also put a holter monitor on her for 2 weeks.  She has been doing well, and would like to slowly restart her decadron.  Otherwise, she is well and w/o complaints.    MEDICAL HISTORY: Past Medical History  Diagnosis Date  . Asthma   . GERD (gastroesophageal reflux disease)   . Dysrhythmia     PAT-sees dr Laurence Compton meds  . Breast cancer     inflammatory right breast ca  . Allergy   . PAT (paroxysmal atrial tachycardia)     hx  . Arthritis     osteopenia,knees  . PONV (postoperative nausea and vomiting) 09-13-12    severe, with Port-a-cath, was managed without PONV    ALLERGIES:  is allergic to anesthetics, amide; bactrim; sulfa antibiotics; codeine; codeine phosphate; and eggs or egg-derived products.  MEDICATIONS:  Current Outpatient  Prescriptions  Medication Sig Dispense Refill  . albuterol (PROVENTIL HFA;VENTOLIN HFA) 108 (90 BASE) MCG/ACT inhaler Inhale 2 puffs into the lungs every 6 (six) hours as needed.      . Alum & Mag Hydroxide-Simeth (MAGIC MOUTHWASH W/LIDOCAINE) SOLN Take 5 mLs by mouth 4 (four) times daily as needed.  60 mL  0  . aspirin EC 81 MG tablet Take 162 mg by mouth every 6 (six) hours as needed. For blood clot symptoms      . calcium-vitamin D (OSCAL WITH D) 500-200 MG-UNIT per tablet Take 1 tablet by mouth daily.      . ciprofloxacin (CIPRO) 500 MG tablet Take 500 mg by mouth 2 (two) times daily. As needed      . dexamethasone (DECADRON) 4 MG tablet Take 8 mg by mouth 2 (two) times daily with a meal. Take two tablets twice a day the day before chemo, then two tablets twice a day for three days after chemo.      Marland Kitchen dexamethasone (DECADRON) 4 MG tablet Take 2 tablets (8 mg total) by mouth 2 (two) times daily with a meal. Take daily starting the day after chemotherapy for 2 days. Take with food.  30 tablet  1  . fish oil-omega-3 fatty acids 1000 MG capsule Take 2 g by mouth daily.      . Flaxseed, Linseed, (FLAX SEEDS PO) Take 1 tablet by mouth daily.      . fluticasone (FLONASE) 50 MCG/ACT nasal spray Place 1 spray into  the nose daily as needed. For allergies      . glucosamine-chondroitin 500-400 MG tablet Take 1 tablet by mouth daily.      Marland Kitchen HYDROcodone-acetaminophen (NORCO/VICODIN) 5-325 MG per tablet Take 1 tablet by mouth every 6 (six) hours as needed. Pain      . lidocaine-prilocaine (EMLA) cream Apply topically as needed.  30 g  5  . LORazepam (ATIVAN) 0.5 MG tablet Take 1 tablet (0.5 mg total) by mouth every 6 (six) hours as needed (Nausea or vomiting).  30 tablet  0  . metoprolol succinate (TOPROL XL) 50 MG 24 hr tablet Take 1 tablet (50 mg total) by mouth daily. Take with or immediately following a meal.  30 tablet  3  . Multiple Vitamin (MULTIVITAMIN WITH MINERALS) TABS Take 1 tablet by mouth daily.       Marland Kitchen omeprazole (PRILOSEC) 20 MG capsule Take 20 mg by mouth daily as needed. For heartburn      . ondansetron (ZOFRAN) 8 MG tablet Take 1 tablet (8 mg total) by mouth 2 (two) times daily. Take two times a day starting the day after chemo for 2 days. Then take two times a day as needed for nausea or vomiting.  30 tablet  1  . oxyCODONE-acetaminophen (ROXICET) 5-325 MG per tablet Take 1 tablet by mouth every 4 (four) hours as needed for pain.  15 tablet  0  . prochlorperazine (COMPAZINE) 10 MG tablet Take 1 tablet (10 mg total) by mouth every 6 (six) hours as needed (Nausea or vomiting).  30 tablet  1  . prochlorperazine (COMPAZINE) 25 MG suppository Place 1 suppository (25 mg total) rectally every 12 (twelve) hours as needed for nausea.  12 suppository  3  . valACYclovir (VALTREX) 500 MG tablet Take 1 tablet (500 mg total) by mouth 3 (three) times daily.  90 tablet  6    SURGICAL HISTORY:  Past Surgical History  Procedure Date  . Dilation and curettage of uterus     x3  . Tonsillectomy   . Colonoscopy   . Portacath placement 07/21/2012    Procedure: INSERTION PORT-A-CATH;  Surgeon: Emelia Loron, MD;  Location: [facility];  Service: General;  Laterality: Left;    REVIEW OF SYSTEMS:   General: fatigue (+), night sweats (-), fever (-), pain (+) Lymph: palpable nodes (-) HEENT: vision changes (-), mucositis (-), gum bleeding (-), epistaxis (-) Cardiovascular: chest pain (-), palpitations (-) Pulmonary: shortness of breath (-), dyspnea on exertion (-), cough (-), hemoptysis (-) GI:  Early satiety (-), melena (-), dysphagia (-), nausea/vomiting (-), diarrhea (-) GU: dysuria (-), hematuria (-), incontinence (-) Musculoskeletal: joint swelling (-), joint pain (-), back pain (-) Neuro: weakness (-), numbness (-), headache (-), confusion (-) Skin: Rash (-), lesions (-), dryness (-) Psych: depression (-), suicidal/homicidal ideation (-), feeling of hopelessness  (-)   PHYSICAL EXAMINATION:  BP 117/71  Pulse 111  Temp 98.6 F (37 C) (Oral)  Resp 20  Ht 5\' 1"  (1.549 m)  Wt 143 lb 14.4 oz (65.273 kg)  BMI 27.19 kg/m2 General: Patient is a well appearing female in no acute distress HEENT: PERRLA, sclerae anicteric no conjunctival pallor, MMM, HR 99 on recheck Neck: supple, no palpable adenopathy Lungs: clear to auscultation bilaterally, no wheezes, rhonchi, or rales Cardiovascular: regular rate rhythm, S1, S2, no murmurs, rubs or gallops Abdomen: Soft, non-tender, non-distended, normoactive bowel sounds, no HSM Extremities: warm and well perfused, no clubbing, cyanosis, or edema Skin: No rashes or  lesions Neuro: Non-focal Right breast reveals grossly enlarged breast with Dimpling of the skin And retraction of the nipple, improving ECOG PERFORMANCE STATUS: 0 - Asymptomatic   LABORATORY DATA: Lab Results  Component Value Date   WBC 6.5 10/15/2012   HGB 10.4* 10/15/2012   HCT 30.7* 10/15/2012   MCV 97.2 10/15/2012   PLT 189 10/15/2012      Chemistry      Component Value Date/Time   NA 137 10/08/2012 1042   NA 137 09/15/2012 1330   K 3.9 10/08/2012 1042   K 3.4* 09/15/2012 1330   CL 105 10/08/2012 1042   CL 100 09/15/2012 1330   CO2 26 10/08/2012 1042   CO2 27 09/15/2012 1330   BUN 18.0 10/08/2012 1042   BUN 9 09/15/2012 1330   CREATININE 0.8 10/08/2012 1042   CREATININE 0.66 09/15/2012 1330      Component Value Date/Time   CALCIUM 9.0 10/08/2012 1042   CALCIUM 9.3 09/15/2012 1330   ALKPHOS 72 10/08/2012 1042   ALKPHOS 96 09/10/2012 1401   AST 21 10/08/2012 1042   AST 12 09/10/2012 1401   ALT 39 10/08/2012 1042   ALT 16 09/10/2012 1401   BILITOT 0.49 10/08/2012 1042   BILITOT 0.9 09/10/2012 1401       RADIOGRAPHIC STUDIES:  ASSESSMENT: 66 year old female with  #1 inflammatory right breast cancer with positive lymph nodes the tumor is ER negative PR negative HER-2/neu positive. Patient is  to receive neoadjuvant chemotherapy  intially Consisting of FEC q 2 weeks x 4 cycles. Then she will receive taxol and herceptin q week x 12 weeks.Once patient completes her chemotherapy then we will get restaging MRIs performed to evaluate response to therapy. Patient does understand that she will most likely need a mastectomy because she does have inflammatory breast cancer.  #2 Tachycardia  PLAN:   #1 Ms. Lagrange continues to do well.  She will receive her chemotherapy today. She will take 4mg  Decadron BID x 3days after her chemo instead of 8mg .      #2 Ms. Birenbaum does continues to have tachycardia.  She was started on Toprol XL and is tolerating it well.  Her HR is 99 today.    #3 I will see her back next week for cycle 6.    All questions were answered. The patient knows to call the clinic with any problems, questions or concerns. We can certainly see the patient much sooner if necessary.  I spent 25 minutes counseling the patient face to face. The total time spent in the appointment was 30 minutes.  This case was reviewed with Dr. Welton Flakes.  Cherie Ouch Lyn Hollingshead, NP Medical Oncology [facility] Phone: [phone]

## 2012-10-15 NOTE — Telephone Encounter (Signed)
gve the pt her nov 2013 appt calendar °

## 2012-10-15 NOTE — Patient Instructions (Addendum)
Doing well.  Proceed with chemo.  Take one tablet of Decadron twice a day for the next three days with the taper like we discussed.  We will see you back next week.

## 2012-10-15 NOTE — Patient Instructions (Signed)
[facility] Discharge Instructions for Patients Receiving Chemotherapy  Today you received the following chemotherapy agents Taxol and Herceptin.  To help prevent nausea and vomiting after your treatment, we encourage you to take your nausea medication. Begin taking your nausea medication as often as prescribed for by Dr. Welton Flakes.    If you develop nausea and vomiting that is not controlled by your nausea medication, call the clinic. If it is after clinic hours your family physician or the after hours number for the clinic or go to the Emergency Department.   BELOW ARE SYMPTOMS THAT SHOULD BE REPORTED IMMEDIATELY:  *FEVER GREATER THAN 100.5 F  *CHILLS WITH OR WITHOUT FEVER  NAUSEA AND VOMITING THAT IS NOT CONTROLLED WITH YOUR NAUSEA MEDICATION  *UNUSUAL SHORTNESS OF BREATH  *UNUSUAL BRUISING OR BLEEDING  TENDERNESS IN MOUTH AND THROAT WITH OR WITHOUT PRESENCE OF ULCERS  *URINARY PROBLEMS  *BOWEL PROBLEMS  UNUSUAL RASH Items with * indicate a potential emergency and should be followed up as soon as possible.  One of the nurses will contact you 24 hours after your treatment. Please let the nurse know about any problems that you may have experienced. Feel free to call the clinic you have any questions or concerns. The clinic phone number is [phone].   I have been informed and understand all the instructions given to me. I know to contact the clinic, my physician, or go to the Emergency Department if any problems should occur. I do not have any questions at this time, but understand that I may call the clinic during office hours   should I have any questions or need assistance in obtaining follow up care.    __________________________________________  _____________  __________ Signature of Patient or Authorized Representative            Date                   Time    __________________________________________ Nurse's Signature

## 2012-10-22 ENCOUNTER — Ambulatory Visit (HOSPITAL_BASED_OUTPATIENT_CLINIC_OR_DEPARTMENT_OTHER): Payer: Medicare Other

## 2012-10-22 ENCOUNTER — Other Ambulatory Visit (HOSPITAL_BASED_OUTPATIENT_CLINIC_OR_DEPARTMENT_OTHER): Payer: Medicare Other | Admitting: Lab

## 2012-10-22 ENCOUNTER — Ambulatory Visit (HOSPITAL_BASED_OUTPATIENT_CLINIC_OR_DEPARTMENT_OTHER): Payer: Medicare Other | Admitting: Adult Health

## 2012-10-22 ENCOUNTER — Encounter: Payer: Self-pay | Admitting: Adult Health

## 2012-10-22 VITALS — BP 122/80 | HR 98 | Temp 98.7°F | Resp 20 | Ht 61.0 in | Wt 144.7 lb

## 2012-10-22 DIAGNOSIS — Z171 Estrogen receptor negative status [ER-]: Secondary | ICD-10-CM

## 2012-10-22 DIAGNOSIS — R000 Tachycardia, unspecified: Secondary | ICD-10-CM

## 2012-10-22 DIAGNOSIS — Z5111 Encounter for antineoplastic chemotherapy: Secondary | ICD-10-CM | POA: Diagnosis not present

## 2012-10-22 DIAGNOSIS — C50119 Malignant neoplasm of central portion of unspecified female breast: Secondary | ICD-10-CM

## 2012-10-22 DIAGNOSIS — C50919 Malignant neoplasm of unspecified site of unspecified female breast: Secondary | ICD-10-CM | POA: Diagnosis not present

## 2012-10-22 DIAGNOSIS — Z5112 Encounter for antineoplastic immunotherapy: Secondary | ICD-10-CM

## 2012-10-22 DIAGNOSIS — C773 Secondary and unspecified malignant neoplasm of axilla and upper limb lymph nodes: Secondary | ICD-10-CM

## 2012-10-22 LAB — CBC WITH DIFFERENTIAL/PLATELET
BASO%: 0.1 % (ref 0.0–2.0)
Eosinophils Absolute: 0.1 10*3/uL (ref 0.0–0.5)
HCT: 30.6 % — ABNORMAL LOW (ref 34.8–46.6)
MCHC: 33.3 g/dL (ref 31.5–36.0)
MONO#: 0.4 10*3/uL (ref 0.1–0.9)
NEUT#: 5.2 10*3/uL (ref 1.5–6.5)
RBC: 3.15 10*6/uL — ABNORMAL LOW (ref 3.70–5.45)
WBC: 6.7 10*3/uL (ref 3.9–10.3)
lymph#: 1 10*3/uL (ref 0.9–3.3)
nRBC: 0 % (ref 0–0)

## 2012-10-22 LAB — COMPREHENSIVE METABOLIC PANEL (CC13)
ALT: 35 U/L (ref 0–55)
AST: 18 U/L (ref 5–34)
Albumin: 3.5 g/dL (ref 3.5–5.0)
Calcium: 9 mg/dL (ref 8.4–10.4)
Chloride: 103 mEq/L (ref 98–107)
Potassium: 3.8 mEq/L (ref 3.5–5.1)
Total Protein: 5.9 g/dL — ABNORMAL LOW (ref 6.4–8.3)

## 2012-10-22 MED ORDER — ACETAMINOPHEN 325 MG PO TABS
650.0000 mg | ORAL_TABLET | Freq: Once | ORAL | Status: AC
  Administered 2012-10-22: 650 mg via ORAL

## 2012-10-22 MED ORDER — HEPARIN SOD (PORK) LOCK FLUSH 100 UNIT/ML IV SOLN
500.0000 [IU] | Freq: Once | INTRAVENOUS | Status: AC | PRN
  Administered 2012-10-22: 500 [IU]
  Filled 2012-10-22: qty 5

## 2012-10-22 MED ORDER — DEXAMETHASONE SODIUM PHOSPHATE 4 MG/ML IJ SOLN
20.0000 mg | Freq: Once | INTRAMUSCULAR | Status: AC
  Administered 2012-10-22: 20 mg via INTRAVENOUS

## 2012-10-22 MED ORDER — SODIUM CHLORIDE 0.9 % IV SOLN: Freq: Once | INTRAVENOUS | Status: DC

## 2012-10-22 MED ORDER — DIPHENHYDRAMINE HCL 50 MG/ML IJ SOLN
50.0000 mg | Freq: Once | INTRAMUSCULAR | Status: AC
  Administered 2012-10-22: 50 mg via INTRAVENOUS

## 2012-10-22 MED ORDER — ONDANSETRON 8 MG/50ML IVPB (CHCC)
8.0000 mg | Freq: Once | INTRAVENOUS | Status: AC
  Administered 2012-10-22: 8 mg via INTRAVENOUS

## 2012-10-22 MED ORDER — TRASTUZUMAB CHEMO INJECTION 440 MG
2.0000 mg/kg | Freq: Once | INTRAVENOUS | Status: AC
  Administered 2012-10-22: 126 mg via INTRAVENOUS
  Filled 2012-10-22: qty 6

## 2012-10-22 MED ORDER — PACLITAXEL CHEMO INJECTION 300 MG/50ML
80.0000 mg/m2 | Freq: Once | INTRAVENOUS | Status: AC
  Administered 2012-10-22: 132 mg via INTRAVENOUS
  Filled 2012-10-22: qty 22

## 2012-10-22 MED ORDER — FAMOTIDINE IN NACL 20-0.9 MG/50ML-% IV SOLN
20.0000 mg | Freq: Once | INTRAVENOUS | Status: AC
  Administered 2012-10-22: 20 mg via INTRAVENOUS

## 2012-10-22 MED ORDER — SODIUM CHLORIDE 0.9 % IV SOLN
Freq: Once | INTRAVENOUS | Status: AC
  Administered 2012-10-22: 12:00:00 via INTRAVENOUS

## 2012-10-22 MED ORDER — SODIUM CHLORIDE 0.9 % IJ SOLN
10.0000 mL | INTRAMUSCULAR | Status: DC | PRN
  Administered 2012-10-22: 10 mL
  Filled 2012-10-22: qty 10

## 2012-10-22 NOTE — Patient Instructions (Addendum)
[facility] Discharge Instructions for Patients Receiving Chemotherapy  Today you received the following chemotherapy agents: taxol, herceptin  To help prevent nausea and vomiting after your treatment, we encourage you to take your nausea medication.  Take it as often as prescribed.     If you develop nausea and vomiting that is not controlled by your nausea medication, call the clinic. If it is after clinic hours your family physician or the after hours number for the clinic or go to the Emergency Department.   BELOW ARE SYMPTOMS THAT SHOULD BE REPORTED IMMEDIATELY:  *FEVER GREATER THAN 100.5 F  *CHILLS WITH OR WITHOUT FEVER  NAUSEA AND VOMITING THAT IS NOT CONTROLLED WITH YOUR NAUSEA MEDICATION  *UNUSUAL SHORTNESS OF BREATH  *UNUSUAL BRUISING OR BLEEDING  TENDERNESS IN MOUTH AND THROAT WITH OR WITHOUT PRESENCE OF ULCERS  *URINARY PROBLEMS  *BOWEL PROBLEMS  UNUSUAL RASH Items with * indicate a potential emergency and should be followed up as soon as possible.  Feel free to call the clinic you have any questions or concerns. The clinic phone number is [phone].   I have been informed and understand all the instructions given to me. I know to contact the clinic, my physician, or go to the Emergency Department if any problems should occur. I do not have any questions at this time, but understand that I may call the clinic during office hours   should I have any questions or need assistance in obtaining follow up care.    __________________________________________  _____________  __________ Signature of Patient or Authorized Representative            Date                   Time    __________________________________________ Nurse's Signature    

## 2012-10-22 NOTE — Patient Instructions (Addendum)
Doing well.  Proceed with chemo.  Please call us if you have any questions or concerns.    

## 2012-10-22 NOTE — Progress Notes (Signed)
OFFICE PROGRESS NOTE  CC  Jessica Alken, MD [address] [place] [address]  DIAGNOSIS: 66 year old female with new diagnosis of inflammatory breast cancer  PRIOR THERAPY: #1 patient was originally seen in my clinic for new diagnosis of inflammatory breast cancer she was referred by Dr. Emelia Loron. She had a mammogram performed That showed an abnormality. That was biopsied and it showed an invasive mammary carcinoma with lymphovascular invasion grade 3 ER negative PR negative HER-2/neu positive.  #2 patient has gone on to have MRI of the breasts performed the MRI shows diffuse right breast neoplasm on a large level I right axillary lymph nodes compatible with lymphatic spread.Patient has had a biopsy of the right axillary lymph node that is compatible with invasive mammary carcinoma.  #3 patient  began FEC 100 with day 2 Neulasta on 07/23/2012  #4 Weekly Taxol and Herceptin starting 09/17/12  CURRENT THERAPY: Cycle 6 Weekly Taxol and Herceptin  INTERVAL HISTORY: Jessica Wells 66 y.o. female returns for chemotherapy today. She is doing well.  Her tachycardia has remained well controlled with the Toprol.  She's going to put her Holter monitor on this afternoon.  She's experiencing some numbness in her second toe on both feet, but otherwise is feeling well and w/o complaints.  She denies any fevers, chills, difficulty walking, or any other concerns.      MEDICAL HISTORY: Past Medical History  Diagnosis Date  . Asthma   . GERD (gastroesophageal reflux disease)   . Dysrhythmia     PAT-sees dr Laurence Compton meds  . Breast cancer     inflammatory right breast ca  . Allergy   . PAT (paroxysmal atrial tachycardia)     hx  . Arthritis     osteopenia,knees  . PONV (postoperative nausea and vomiting) 09-13-12    severe, with Port-a-cath, was managed without PONV    ALLERGIES:  is allergic to anesthetics, amide; bactrim; sulfa antibiotics; codeine; codeine  phosphate; and eggs or egg-derived products.  MEDICATIONS:  Current Outpatient Prescriptions  Medication Sig Dispense Refill  . albuterol (PROVENTIL HFA;VENTOLIN HFA) 108 (90 BASE) MCG/ACT inhaler Inhale 2 puffs into the lungs every 6 (six) hours as needed.      . Alum & Mag Hydroxide-Simeth (MAGIC MOUTHWASH W/LIDOCAINE) SOLN Take 5 mLs by mouth 4 (four) times daily as needed.  60 mL  0  . aspirin EC 81 MG tablet Take 162 mg by mouth every 6 (six) hours as needed. For blood clot symptoms      . calcium-vitamin D (OSCAL WITH D) 500-200 MG-UNIT per tablet Take 1 tablet by mouth daily.      . ciprofloxacin (CIPRO) 500 MG tablet Take 500 mg by mouth 2 (two) times daily. As needed      . dexamethasone (DECADRON) 4 MG tablet Take 8 mg by mouth 2 (two) times daily with a meal. Take two tablets twice a day the day before chemo, then two tablets twice a day for three days after chemo.      Marland Kitchen dexamethasone (DECADRON) 4 MG tablet Take 2 tablets (8 mg total) by mouth 2 (two) times daily with a meal. Take daily starting the day after chemotherapy for 2 days. Take with food.  30 tablet  1  . fish oil-omega-3 fatty acids 1000 MG capsule Take 2 g by mouth daily.      . Flaxseed, Linseed, (FLAX SEEDS PO) Take 1 tablet by mouth daily.      . fluticasone (FLONASE) 50 MCG/ACT  nasal spray Place 1 spray into the nose daily as needed. For allergies      . glucosamine-chondroitin 500-400 MG tablet Take 1 tablet by mouth daily.      Marland Kitchen HYDROcodone-acetaminophen (NORCO/VICODIN) 5-325 MG per tablet Take 1 tablet by mouth every 6 (six) hours as needed. Pain      . lidocaine-prilocaine (EMLA) cream Apply topically as needed.  30 g  5  . LORazepam (ATIVAN) 0.5 MG tablet Take 1 tablet (0.5 mg total) by mouth every 6 (six) hours as needed (Nausea or vomiting).  30 tablet  0  . metoprolol succinate (TOPROL XL) 50 MG 24 hr tablet Take 1 tablet (50 mg total) by mouth daily. Take with or immediately following a meal.  30 tablet  3    . Multiple Vitamin (MULTIVITAMIN WITH MINERALS) TABS Take 1 tablet by mouth daily.      Marland Kitchen omeprazole (PRILOSEC) 20 MG capsule Take 20 mg by mouth daily as needed. For heartburn      . ondansetron (ZOFRAN) 8 MG tablet Take 1 tablet (8 mg total) by mouth 2 (two) times daily. Take two times a day starting the day after chemo for 2 days. Then take two times a day as needed for nausea or vomiting.  30 tablet  1  . oxyCODONE-acetaminophen (ROXICET) 5-325 MG per tablet Take 1 tablet by mouth every 4 (four) hours as needed for pain.  15 tablet  0  . prochlorperazine (COMPAZINE) 10 MG tablet Take 1 tablet (10 mg total) by mouth every 6 (six) hours as needed (Nausea or vomiting).  30 tablet  1  . prochlorperazine (COMPAZINE) 25 MG suppository Place 1 suppository (25 mg total) rectally every 12 (twelve) hours as needed for nausea.  12 suppository  3  . valACYclovir (VALTREX) 500 MG tablet Take 1 tablet (500 mg total) by mouth 3 (three) times daily.  90 tablet  6    SURGICAL HISTORY:  Past Surgical History  Procedure Date  . Dilation and curettage of uterus     x3  . Tonsillectomy   . Colonoscopy   . Portacath placement 07/21/2012    Procedure: INSERTION PORT-A-CATH;  Surgeon: Emelia Loron, MD;  Location: [facility];  Service: General;  Laterality: Left;    REVIEW OF SYSTEMS:   General: fatigue (+), night sweats (-), fever (-), pain (+) Lymph: palpable nodes (-) HEENT: vision changes (-), mucositis (-), gum bleeding (-), epistaxis (-) Cardiovascular: chest pain (-), palpitations (-) Pulmonary: shortness of breath (-), dyspnea on exertion (-), cough (-), hemoptysis (-) GI:  Early satiety (-), melena (-), dysphagia (-), nausea/vomiting (-), diarrhea (-) GU: dysuria (-), hematuria (-), incontinence (-) Musculoskeletal: joint swelling (-), joint pain (-), back pain (-) Neuro: weakness (-), numbness (+), headache (-), confusion (-) Skin: Rash (-), lesions (-), dryness (-) Psych:  depression (-), suicidal/homicidal ideation (-), feeling of hopelessness (-)   PHYSICAL EXAMINATION:  BP 122/80  Pulse 98  Temp 98.7 F (37.1 C) (Oral)  Resp 20  Ht 5\' 1"  (1.549 m)  Wt 144 lb 11.2 oz (65.635 kg)  BMI 27.34 kg/m2 General: Patient is a well appearing female in no acute distress HEENT: PERRLA, sclerae anicteric no conjunctival pallor, MMM, HR 99 on recheck Neck: supple, no palpable adenopathy Lungs: clear to auscultation bilaterally, no wheezes, rhonchi, or rales Cardiovascular: regular rate rhythm, S1, S2, no murmurs, rubs or gallops Abdomen: Soft, non-tender, non-distended, normoactive bowel sounds, no HSM Extremities: warm and well perfused, no clubbing,  cyanosis, or edema Skin: No rashes or lesions Neuro: Non-focal, gait, tandem gait intact Right breast reveals grossly enlarged breast with Dimpling of the skin And retraction of the nipple, improving ECOG PERFORMANCE STATUS: 0 - Asymptomatic   LABORATORY DATA: Lab Results  Component Value Date   WBC 6.7 10/22/2012   HGB 10.2* 10/22/2012   HCT 30.6* 10/22/2012   MCV 97.1 10/22/2012   PLT 181 10/22/2012      Chemistry      Component Value Date/Time   NA 138 10/15/2012 1125   NA 137 09/15/2012 1330   K 3.8 10/15/2012 1125   K 3.4* 09/15/2012 1330   CL 105 10/15/2012 1125   CL 100 09/15/2012 1330   CO2 26 10/15/2012 1125   CO2 27 09/15/2012 1330   BUN 17.0 10/15/2012 1125   BUN 9 09/15/2012 1330   CREATININE 0.9 10/15/2012 1125   CREATININE 0.66 09/15/2012 1330      Component Value Date/Time   CALCIUM 9.3 10/15/2012 1125   CALCIUM 9.3 09/15/2012 1330   ALKPHOS 68 10/15/2012 1125   ALKPHOS 96 09/10/2012 1401   AST 19 10/15/2012 1125   AST 12 09/10/2012 1401   ALT 40 10/15/2012 1125   ALT 16 09/10/2012 1401   BILITOT 0.47 10/15/2012 1125   BILITOT 0.9 09/10/2012 1401       RADIOGRAPHIC STUDIES:  ASSESSMENT: 66 year old female with  #1 inflammatory right breast cancer with positive lymph  nodes the tumor is ER negative PR negative HER-2/neu positive. Patient is  to receive neoadjuvant chemotherapy intially Consisting of FEC q 2 weeks x 4 cycles. Then she will receive taxol and herceptin q week x 12 weeks.Once patient completes her chemotherapy then we will get restaging MRIs performed to evaluate response to therapy. Patient does understand that she will most likely need a mastectomy because she does have inflammatory breast cancer.  #2 Tachycardia  PLAN:   #1 Ms. Brunker continues to do well.  She will receive her chemotherapy today. We will monitor her numbness closely, and add Neurontin next week if need be.   #2 Ms. Battiste does continues to have tachycardia.  She was started on Toprol XL and is tolerating it well.    #3 I will see her back next week for cycle 7.    All questions were answered. The patient knows to call the clinic with any problems, questions or concerns. We can certainly see the patient much sooner if necessary.  I spent 25 minutes counseling the patient face to face. The total time spent in the appointment was 30 minutes.  This case was reviewed with Dr. Welton Flakes.  Cherie Ouch Lyn Hollingshead, NP Medical Oncology [facility] Phone: [phone]

## 2012-10-24 DIAGNOSIS — R002 Palpitations: Secondary | ICD-10-CM | POA: Diagnosis not present

## 2012-10-27 ENCOUNTER — Ambulatory Visit (HOSPITAL_BASED_OUTPATIENT_CLINIC_OR_DEPARTMENT_OTHER): Payer: Medicare Other | Admitting: Oncology

## 2012-10-27 ENCOUNTER — Other Ambulatory Visit (HOSPITAL_BASED_OUTPATIENT_CLINIC_OR_DEPARTMENT_OTHER): Payer: Medicare Other | Admitting: Lab

## 2012-10-27 ENCOUNTER — Encounter: Payer: Self-pay | Admitting: Oncology

## 2012-10-27 ENCOUNTER — Other Ambulatory Visit (HOSPITAL_COMMUNITY): Payer: Self-pay

## 2012-10-27 VITALS — BP 117/70 | HR 108 | Temp 98.4°F | Resp 20 | Ht 61.0 in | Wt 144.7 lb

## 2012-10-27 DIAGNOSIS — C773 Secondary and unspecified malignant neoplasm of axilla and upper limb lymph nodes: Secondary | ICD-10-CM | POA: Diagnosis not present

## 2012-10-27 DIAGNOSIS — I471 Supraventricular tachycardia: Secondary | ICD-10-CM

## 2012-10-27 DIAGNOSIS — C50119 Malignant neoplasm of central portion of unspecified female breast: Secondary | ICD-10-CM

## 2012-10-27 DIAGNOSIS — C50919 Malignant neoplasm of unspecified site of unspecified female breast: Secondary | ICD-10-CM

## 2012-10-27 DIAGNOSIS — R209 Unspecified disturbances of skin sensation: Secondary | ICD-10-CM | POA: Diagnosis not present

## 2012-10-27 DIAGNOSIS — R000 Tachycardia, unspecified: Secondary | ICD-10-CM | POA: Diagnosis not present

## 2012-10-27 LAB — COMPREHENSIVE METABOLIC PANEL (CC13)
ALT: 33 U/L (ref 0–55)
CO2: 27 mEq/L (ref 22–29)
Calcium: 9.3 mg/dL (ref 8.4–10.4)
Chloride: 102 mEq/L (ref 98–107)
Creatinine: 0.8 mg/dL (ref 0.6–1.1)
Glucose: 149 mg/dl — ABNORMAL HIGH (ref 70–99)
Total Bilirubin: 0.67 mg/dL (ref 0.20–1.20)

## 2012-10-27 LAB — CBC WITH DIFFERENTIAL/PLATELET
BASO%: 0.4 % (ref 0.0–2.0)
Basophils Absolute: 0 10*3/uL (ref 0.0–0.1)
Eosinophils Absolute: 0 10*3/uL (ref 0.0–0.5)
HCT: 30.8 % — ABNORMAL LOW (ref 34.8–46.6)
HGB: 10.9 g/dL — ABNORMAL LOW (ref 11.6–15.9)
LYMPH%: 8.1 % — ABNORMAL LOW (ref 14.0–49.7)
MCHC: 35.3 g/dL (ref 31.5–36.0)
MONO#: 0.4 10*3/uL (ref 0.1–0.9)
NEUT#: 6.1 10*3/uL (ref 1.5–6.5)
NEUT%: 85 % — ABNORMAL HIGH (ref 38.4–76.8)
Platelets: 180 10*3/uL (ref 145–400)
WBC: 7.2 10*3/uL (ref 3.9–10.3)
lymph#: 0.6 10*3/uL — ABNORMAL LOW (ref 0.9–3.3)

## 2012-10-27 NOTE — Progress Notes (Signed)
OFFICE PROGRESS NOTE  CC  Jessica Alken, MD [address] [place] [address]  DIAGNOSIS: 66 year old female with new diagnosis of inflammatory breast cancer  PRIOR THERAPY: #1 patient was originally seen in my clinic for new diagnosis of inflammatory breast cancer she was referred by Dr. Emelia Loron. She had a mammogram performed That showed an abnormality. That was biopsied and it showed an invasive mammary carcinoma with lymphovascular invasion grade 3 ER negative PR negative HER-2/neu positive.  #2 patient has gone on to have MRI of the breasts performed the MRI shows diffuse right breast neoplasm on a large level I right axillary lymph nodes compatible with lymphatic spread.Patient has had a biopsy of the right axillary lymph node that is compatible with invasive mammary carcinoma.  #3 patient  began neoadjuvant FEC 100 with day 2 Neulasta on 07/23/2012  #4 Weekly neoadjuvant Taxol and Herceptin starting 09/17/12  CURRENT THERAPY: Cycle 7 Weekly Taxol and Herceptin  as neoadjuvant  INTERVAL HISTORY: Ted Leonhart 66 y.o. female returns for chemotherapy today. She is doing well.  Her tachycardia has remained well controlled with the Toprol.  He is currently being monitored by a Holter monitor..  She's experiencing some numbness in her second toe on both feet, numbness has not worsened but not improved. But otherwise is feeling well and w/o complaints.  She denies any fevers, chills, difficulty walking, or any other concerns.      MEDICAL HISTORY: Past Medical History  Diagnosis Date  . Asthma   . GERD (gastroesophageal reflux disease)   . Dysrhythmia     PAT-sees dr Laurence Compton meds  . Breast cancer     inflammatory right breast ca  . Allergy   . PAT (paroxysmal atrial tachycardia)     hx  . Arthritis     osteopenia,knees  . PONV (postoperative nausea and vomiting) 09-13-12    severe, with Port-a-cath, was managed without PONV    ALLERGIES:  is  allergic to anesthetics, amide; bactrim; sulfa antibiotics; codeine; codeine phosphate; and eggs or egg-derived products.  MEDICATIONS:  Current Outpatient Prescriptions  Medication Sig Dispense Refill  . albuterol (PROVENTIL HFA;VENTOLIN HFA) 108 (90 BASE) MCG/ACT inhaler Inhale 2 puffs into the lungs every 6 (six) hours as needed.      . Alum & Mag Hydroxide-Simeth (MAGIC MOUTHWASH W/LIDOCAINE) SOLN Take 5 mLs by mouth 4 (four) times daily as needed.  60 mL  0  . calcium-vitamin D (OSCAL WITH D) 500-200 MG-UNIT per tablet Take 1 tablet by mouth daily.      . ciprofloxacin (CIPRO) 500 MG tablet Take 500 mg by mouth 2 (two) times daily. As needed      . dexamethasone (DECADRON) 4 MG tablet Take 8 mg by mouth 2 (two) times daily with a meal. Take two tablets twice a day the day before chemo, then two tablets twice a day for three days after chemo.      Marland Kitchen dexamethasone (DECADRON) 4 MG tablet Take 2 tablets (8 mg total) by mouth 2 (two) times daily with a meal. Take daily starting the day after chemotherapy for 2 days. Take with food.  30 tablet  1  . fish oil-omega-3 fatty acids 1000 MG capsule Take 2 g by mouth daily.      . Flaxseed, Linseed, (FLAX SEEDS PO) Take 1 tablet by mouth daily.      . fluticasone (FLONASE) 50 MCG/ACT nasal spray Place 1 spray into the nose daily as needed. For allergies      .  glucosamine-chondroitin 500-400 MG tablet Take 1 tablet by mouth daily.      Marland Kitchen HYDROcodone-acetaminophen (NORCO/VICODIN) 5-325 MG per tablet Take 1 tablet by mouth every 6 (six) hours as needed. Pain      . lidocaine-prilocaine (EMLA) cream Apply topically as needed.  30 g  5  . LORazepam (ATIVAN) 0.5 MG tablet Take 1 tablet (0.5 mg total) by mouth every 6 (six) hours as needed (Nausea or vomiting).  30 tablet  0  . metoprolol succinate (TOPROL XL) 50 MG 24 hr tablet Take 1 tablet (50 mg total) by mouth daily. Take with or immediately following a meal.  30 tablet  3  . Multiple Vitamin  (MULTIVITAMIN WITH MINERALS) TABS Take 1 tablet by mouth daily.      Marland Kitchen omeprazole (PRILOSEC) 20 MG capsule Take 20 mg by mouth daily as needed. For heartburn      . ondansetron (ZOFRAN) 8 MG tablet Take 1 tablet (8 mg total) by mouth 2 (two) times daily. Take two times a day starting the day after chemo for 2 days. Then take two times a day as needed for nausea or vomiting.  30 tablet  1  . oxyCODONE-acetaminophen (ROXICET) 5-325 MG per tablet Take 1 tablet by mouth every 4 (four) hours as needed for pain.  15 tablet  0  . prochlorperazine (COMPAZINE) 10 MG tablet Take 1 tablet (10 mg total) by mouth every 6 (six) hours as needed (Nausea or vomiting).  30 tablet  1  . prochlorperazine (COMPAZINE) 25 MG suppository Place 1 suppository (25 mg total) rectally every 12 (twelve) hours as needed for nausea.  12 suppository  3  . valACYclovir (VALTREX) 500 MG tablet Take 1 tablet (500 mg total) by mouth 3 (three) times daily.  90 tablet  6  . aspirin EC 81 MG tablet Take 162 mg by mouth every 6 (six) hours as needed. For blood clot symptoms        SURGICAL HISTORY:  Past Surgical History  Procedure Date  . Dilation and curettage of uterus     x3  . Tonsillectomy   . Colonoscopy   . Portacath placement 07/21/2012    Procedure: INSERTION PORT-A-CATH;  Surgeon: Emelia Loron, MD;  Location: [facility];  Service: General;  Laterality: Left;    REVIEW OF SYSTEMS:   General: fatigue (+), night sweats (-), fever (-), pain (+) Lymph: palpable nodes (-) HEENT: vision changes (-), mucositis (-), gum bleeding (-), epistaxis (-) Cardiovascular: chest pain (-), palpitations (-) Pulmonary: shortness of breath (-), dyspnea on exertion (-), cough (-), hemoptysis (-) GI:  Early satiety (-), melena (-), dysphagia (-), nausea/vomiting (-), diarrhea (-) GU: dysuria (-), hematuria (-), incontinence (-) Musculoskeletal: joint swelling (-), joint pain (-), back pain (-) Neuro: weakness (-),  numbness (+), headache (-), confusion (-) Skin: Rash (-), lesions (-), dryness (-) Psych: depression (-), suicidal/homicidal ideation (-), feeling of hopelessness (-)   PHYSICAL EXAMINATION:  BP 117/70  Pulse 108  Temp 98.4 F (36.9 C) (Oral)  Resp 20  Ht 5\' 1"  (1.549 m)  Wt 144 lb 11.2 oz (65.635 kg)  BMI 27.34 kg/m2 General: Patient is a well appearing female in no acute distress HEENT: PERRLA, sclerae anicteric no conjunctival pallor, MMM, HR 99 on recheck Neck: supple, no palpable adenopathy Lungs: clear to auscultation bilaterally, no wheezes, rhonchi, or rales Cardiovascular: regular rate rhythm, S1, S2, no murmurs, rubs or gallops Abdomen: Soft, non-tender, non-distended, normoactive bowel sounds, no HSM Extremities:  warm and well perfused, no clubbing, cyanosis, or edema Skin: No rashes or lesions Neuro: Non-focal, gait, tandem gait intact Right breast reveals improvement in  Dimpling of the skin nipple is now more prominent and I am not able to feel a mass  ECOG PERFORMANCE STATUS: 0 - Asymptomatic   LABORATORY DATA: Lab Results  Component Value Date   WBC 7.2 10/27/2012   HGB 10.9* 10/27/2012   HCT 30.8* 10/27/2012   MCV 99.8 10/27/2012   PLT 180 10/27/2012      Chemistry      Component Value Date/Time   NA 137 10/22/2012 1042   NA 137 09/15/2012 1330   K 3.8 10/22/2012 1042   K 3.4* 09/15/2012 1330   CL 103 10/22/2012 1042   CL 100 09/15/2012 1330   CO2 27 10/22/2012 1042   CO2 27 09/15/2012 1330   BUN 17.0 10/22/2012 1042   BUN 9 09/15/2012 1330   CREATININE 0.7 10/22/2012 1042   CREATININE 0.66 09/15/2012 1330      Component Value Date/Time   CALCIUM 9.0 10/22/2012 1042   CALCIUM 9.3 09/15/2012 1330   ALKPHOS 69 10/22/2012 1042   ALKPHOS 96 09/10/2012 1401   AST 18 10/22/2012 1042   AST 12 09/10/2012 1401   ALT 35 10/22/2012 1042   ALT 16 09/10/2012 1401   BILITOT 0.48 10/22/2012 1042   BILITOT 0.9 09/10/2012 1401       RADIOGRAPHIC  STUDIES:  ASSESSMENT: 66 year old female with  #1 inflammatory right breast cancer with positive lymph nodes the tumor is ER negative PR negative HER-2/neu positive. Patient is  to receive neoadjuvant chemotherapy intially Consisting of FEC q 2 weeks x 4 cycles. Then she will receive taxol and herceptin q week x 12 weeks.Once patient completes her chemotherapy then we will get restaging MRIs performed to evaluate response to therapy. Patient does understand that she will most likely need a mastectomy because she does have inflammatory breast cancer.  #2 Tachycardia  PLAN:   #1 Ms. Langer continues to do well.  She will receive her chemotherapy on  10/29/2012.   #2 We will monitor her numbness closely, however I did ask her to start taking a super B complex on a daily basis. If this does not help her then we certainly can add  Neurontin next week if need be. Start her on a low dose of 100 mg twice a day.  #2 Ms. Sindoni does continues to have tachycardia.  She was started on Toprol XL and is tolerating it well.    #3 I will see her back next week for cycle 8.    All questions were answered. The patient knows to call the clinic with any problems, questions or concerns. We can certainly see the patient much sooner if necessary.  I spent 25 minutes counseling the patient face to face. The total time spent in the appointment was 30 minutes.  Drue Second, MD [facility] [phone] (beeper) [phone] (Office)  10/27/2012, 10:27 AM

## 2012-10-29 ENCOUNTER — Ambulatory Visit (HOSPITAL_BASED_OUTPATIENT_CLINIC_OR_DEPARTMENT_OTHER): Payer: Medicare Other

## 2012-10-29 ENCOUNTER — Ambulatory Visit: Payer: Medicare Other | Admitting: Oncology

## 2012-10-29 ENCOUNTER — Ambulatory Visit: Payer: Medicare Other | Admitting: Adult Health

## 2012-10-29 ENCOUNTER — Other Ambulatory Visit: Payer: Medicare Other | Admitting: Lab

## 2012-10-29 VITALS — BP 118/63 | HR 77 | Temp 98.0°F | Resp 18

## 2012-10-29 DIAGNOSIS — C50919 Malignant neoplasm of unspecified site of unspecified female breast: Secondary | ICD-10-CM

## 2012-10-29 DIAGNOSIS — Z5112 Encounter for antineoplastic immunotherapy: Secondary | ICD-10-CM | POA: Diagnosis not present

## 2012-10-29 DIAGNOSIS — C773 Secondary and unspecified malignant neoplasm of axilla and upper limb lymph nodes: Secondary | ICD-10-CM | POA: Diagnosis not present

## 2012-10-29 DIAGNOSIS — Z5111 Encounter for antineoplastic chemotherapy: Secondary | ICD-10-CM | POA: Diagnosis not present

## 2012-10-29 DIAGNOSIS — C50119 Malignant neoplasm of central portion of unspecified female breast: Secondary | ICD-10-CM

## 2012-10-29 MED ORDER — PACLITAXEL CHEMO INJECTION 300 MG/50ML
80.0000 mg/m2 | Freq: Once | INTRAVENOUS | Status: AC
  Administered 2012-10-29: 132 mg via INTRAVENOUS
  Filled 2012-10-29: qty 22

## 2012-10-29 MED ORDER — DEXAMETHASONE SODIUM PHOSPHATE 4 MG/ML IJ SOLN
20.0000 mg | Freq: Once | INTRAMUSCULAR | Status: AC
  Administered 2012-10-29: 20 mg via INTRAVENOUS

## 2012-10-29 MED ORDER — TRASTUZUMAB CHEMO INJECTION 440 MG
2.0000 mg/kg | Freq: Once | INTRAVENOUS | Status: AC
  Administered 2012-10-29: 126 mg via INTRAVENOUS
  Filled 2012-10-29: qty 6

## 2012-10-29 MED ORDER — HEPARIN SOD (PORK) LOCK FLUSH 100 UNIT/ML IV SOLN
500.0000 [IU] | Freq: Once | INTRAVENOUS | Status: AC | PRN
  Administered 2012-10-29: 500 [IU]
  Filled 2012-10-29: qty 5

## 2012-10-29 MED ORDER — ACETAMINOPHEN 325 MG PO TABS
650.0000 mg | ORAL_TABLET | Freq: Once | ORAL | Status: AC
  Administered 2012-10-29: 650 mg via ORAL

## 2012-10-29 MED ORDER — FAMOTIDINE IN NACL 20-0.9 MG/50ML-% IV SOLN
20.0000 mg | Freq: Once | INTRAVENOUS | Status: AC
  Administered 2012-10-29: 20 mg via INTRAVENOUS

## 2012-10-29 MED ORDER — DIPHENHYDRAMINE HCL 50 MG/ML IJ SOLN
50.0000 mg | Freq: Once | INTRAMUSCULAR | Status: AC
  Administered 2012-10-29: 50 mg via INTRAVENOUS

## 2012-10-29 MED ORDER — SODIUM CHLORIDE 0.9 % IJ SOLN
10.0000 mL | INTRAMUSCULAR | Status: DC | PRN
  Administered 2012-10-29: 10 mL
  Filled 2012-10-29: qty 10

## 2012-10-29 MED ORDER — ONDANSETRON 8 MG/50ML IVPB (CHCC)
8.0000 mg | Freq: Once | INTRAVENOUS | Status: AC
  Administered 2012-10-29: 8 mg via INTRAVENOUS

## 2012-10-29 NOTE — Patient Instructions (Addendum)
[facility] Discharge Instructions for Patients Receiving Chemotherapy  Today you received the following chemotherapy agents: Taxol and Herceptin. To help prevent nausea and vomiting after your treatment, we encourage you to take your nausea medication as needed.  If you develop nausea and vomiting that is not controlled by your nausea medication, call the clinic. If it is after clinic hours your family physician or the after hours number for the clinic or go to the Emergency Department.   BELOW ARE SYMPTOMS THAT SHOULD BE REPORTED IMMEDIATELY:  *FEVER GREATER THAN 100.5 F  *CHILLS WITH OR WITHOUT FEVER  NAUSEA AND VOMITING THAT IS NOT CONTROLLED WITH YOUR NAUSEA MEDICATION  *UNUSUAL SHORTNESS OF BREATH  *UNUSUAL BRUISING OR BLEEDING  TENDERNESS IN MOUTH AND THROAT WITH OR WITHOUT PRESENCE OF ULCERS  *URINARY PROBLEMS  *BOWEL PROBLEMS  UNUSUAL RASH Items with * indicate a potential emergency and should be followed up as soon as possible.  Feel free to call the clinic you have any questions or concerns. The clinic phone number is [phone].   I have been informed and understand all the instructions given to me. I know to contact the clinic, my physician, or go to the Emergency Department if any problems should occur. I do not have any questions at this time, but understand that I may call the clinic during office hours   should I have any questions or need assistance in obtaining follow up care.

## 2012-11-01 ENCOUNTER — Ambulatory Visit: Payer: Medicare Other | Admitting: Adult Health

## 2012-11-04 ENCOUNTER — Telehealth: Payer: Self-pay | Admitting: *Deleted

## 2012-11-04 NOTE — Telephone Encounter (Signed)
Message copied by Leotis Pain on Thu Nov 04, 2012  3:35 PM ------      Message from: Mariane Masters D      Created: Thu Oct 21, 2012 10:06 AM      Regarding: MONITOR       10/21/12 Forward information to [place].

## 2012-11-04 NOTE — Telephone Encounter (Signed)
Enrolled 10/19/12 for event monitor to be mailed

## 2012-11-05 ENCOUNTER — Encounter: Payer: Self-pay | Admitting: Adult Health

## 2012-11-05 ENCOUNTER — Ambulatory Visit (HOSPITAL_BASED_OUTPATIENT_CLINIC_OR_DEPARTMENT_OTHER): Payer: Medicare Other

## 2012-11-05 ENCOUNTER — Other Ambulatory Visit (HOSPITAL_BASED_OUTPATIENT_CLINIC_OR_DEPARTMENT_OTHER): Payer: Medicare Other | Admitting: Lab

## 2012-11-05 ENCOUNTER — Ambulatory Visit (HOSPITAL_BASED_OUTPATIENT_CLINIC_OR_DEPARTMENT_OTHER): Payer: Medicare Other | Admitting: Adult Health

## 2012-11-05 VITALS — BP 138/75 | HR 92 | Temp 98.4°F | Resp 20 | Ht 61.0 in | Wt 143.2 lb

## 2012-11-05 DIAGNOSIS — C50919 Malignant neoplasm of unspecified site of unspecified female breast: Secondary | ICD-10-CM

## 2012-11-05 DIAGNOSIS — G589 Mononeuropathy, unspecified: Secondary | ICD-10-CM | POA: Diagnosis not present

## 2012-11-05 DIAGNOSIS — Z5112 Encounter for antineoplastic immunotherapy: Secondary | ICD-10-CM

## 2012-11-05 DIAGNOSIS — C50119 Malignant neoplasm of central portion of unspecified female breast: Secondary | ICD-10-CM

## 2012-11-05 DIAGNOSIS — C773 Secondary and unspecified malignant neoplasm of axilla and upper limb lymph nodes: Secondary | ICD-10-CM

## 2012-11-05 DIAGNOSIS — G629 Polyneuropathy, unspecified: Secondary | ICD-10-CM

## 2012-11-05 DIAGNOSIS — R000 Tachycardia, unspecified: Secondary | ICD-10-CM

## 2012-11-05 LAB — CBC WITH DIFFERENTIAL/PLATELET
Basophils Absolute: 0 10*3/uL (ref 0.0–0.1)
EOS%: 0.1 % (ref 0.0–7.0)
MCH: 32.9 pg (ref 25.1–34.0)
MCV: 97.5 fL (ref 79.5–101.0)
MONO%: 5.4 % (ref 0.0–14.0)
RBC: 3.25 10*6/uL — ABNORMAL LOW (ref 3.70–5.45)
RDW: 14.7 % — ABNORMAL HIGH (ref 11.2–14.5)
nRBC: 0 % (ref 0–0)

## 2012-11-05 LAB — COMPREHENSIVE METABOLIC PANEL (CC13)
AST: 20 U/L (ref 5–34)
Albumin: 3.1 g/dL — ABNORMAL LOW (ref 3.5–5.0)
Alkaline Phosphatase: 54 U/L (ref 40–150)
Potassium: 4.2 mEq/L (ref 3.5–5.1)
Sodium: 136 mEq/L (ref 136–145)
Total Protein: 5.6 g/dL — ABNORMAL LOW (ref 6.4–8.3)

## 2012-11-05 MED ORDER — GABAPENTIN 100 MG PO CAPS: 100.0000 mg | ORAL_CAPSULE | Freq: Two times a day (BID) | ORAL | Status: DC

## 2012-11-05 MED ORDER — SODIUM CHLORIDE 0.9 % IJ SOLN
10.0000 mL | INTRAMUSCULAR | Status: DC | PRN
  Administered 2012-11-05: 10 mL
  Filled 2012-11-05: qty 10

## 2012-11-05 MED ORDER — SODIUM CHLORIDE 0.9 % IV SOLN
Freq: Once | INTRAVENOUS | Status: AC
  Administered 2012-11-05: 12:00:00 via INTRAVENOUS

## 2012-11-05 MED ORDER — TRASTUZUMAB CHEMO INJECTION 440 MG
2.0000 mg/kg | Freq: Once | INTRAVENOUS | Status: AC
  Administered 2012-11-05: 126 mg via INTRAVENOUS
  Filled 2012-11-05: qty 6

## 2012-11-05 MED ORDER — HEPARIN SOD (PORK) LOCK FLUSH 100 UNIT/ML IV SOLN
500.0000 [IU] | Freq: Once | INTRAVENOUS | Status: AC | PRN
  Administered 2012-11-05: 500 [IU]
  Filled 2012-11-05: qty 5

## 2012-11-05 MED ORDER — DIPHENHYDRAMINE HCL 25 MG PO CAPS
50.0000 mg | ORAL_CAPSULE | Freq: Once | ORAL | Status: AC
  Administered 2012-11-05: 50 mg via ORAL

## 2012-11-05 MED ORDER — DEXAMETHASONE 4 MG PO TABS: 8.0000 mg | ORAL_TABLET | Freq: Two times a day (BID) | ORAL | Status: DC

## 2012-11-05 MED ORDER — ACETAMINOPHEN 325 MG PO TABS
650.0000 mg | ORAL_TABLET | Freq: Once | ORAL | Status: AC
  Administered 2012-11-05: 650 mg via ORAL

## 2012-11-05 NOTE — Patient Instructions (Signed)
[facility] Discharge Instructions for Patients Receiving Chemotherapy  Today you received the following chemotherapy agents Herceptin.      BELOW ARE SYMPTOMS THAT SHOULD BE REPORTED IMMEDIATELY:  *FEVER GREATER THAN 100.5 F  *CHILLS WITH OR WITHOUT FEVER  NAUSEA AND VOMITING THAT IS NOT CONTROLLED WITH YOUR NAUSEA MEDICATION  *UNUSUAL SHORTNESS OF BREATH  *UNUSUAL BRUISING OR BLEEDING  TENDERNESS IN MOUTH AND THROAT WITH OR WITHOUT PRESENCE OF ULCERS  *URINARY PROBLEMS  *BOWEL PROBLEMS  UNUSUAL RASH Items with * indicate a potential emergency and should be followed up as soon as possible.  Feel free to call the clinic you have any questions or concerns. The clinic phone number is [phone].    

## 2012-11-05 NOTE — Progress Notes (Signed)
OFFICE PROGRESS NOTE  CC  [name], MD [address] [place] [address]  DIAGNOSIS: 66 year old female with new diagnosis of inflammatory breast cancer  PRIOR THERAPY: #1 patient was originally seen in my clinic for new diagnosis of inflammatory breast cancer she was referred by Dr. Emelia Loron. She had a mammogram performed That showed an abnormality. That was biopsied and it showed an invasive mammary carcinoma with lymphovascular invasion grade 3 ER negative PR negative HER-2/neu positive.  #2 patient has gone on to have MRI of the breasts performed the MRI shows diffuse right breast neoplasm on a large level I right axillary lymph nodes compatible with lymphatic spread.Patient has had a biopsy of the right axillary lymph node that is compatible with invasive mammary carcinoma.  #3 patient  began neoadjuvant FEC 100 with day 2 Neulasta on 07/23/2012  #4 Weekly neoadjuvant Taxol and Herceptin starting 09/17/12  CURRENT THERAPY: Cycle 8 Weekly Taxol and Herceptin  as neoadjuvant  INTERVAL HISTORY: Jessica Wells 66 y.o. female returns for chemotherapy today. She is doing well.  She's becoming more fatigued.  Her numbness is much worse. It is now extending from her toes to her calves, more so on the right leg than the left.  She has began to feel slightly unbalanced.  She is concerned.  Otherwise she is feeling well and w/o complaints.   MEDICAL HISTORY: Past Medical History  Diagnosis Date  . Asthma   . GERD (gastroesophageal reflux disease)   . Dysrhythmia     PAT-sees dr Laurence Compton meds  . Breast cancer     inflammatory right breast ca  . Allergy   . PAT (paroxysmal atrial tachycardia)     hx  . Arthritis     osteopenia,knees  . PONV (postoperative nausea and vomiting) 09-13-12    severe, with Port-a-cath, was managed without PONV    ALLERGIES:  is allergic to anesthetics, amide; bactrim; sulfa antibiotics; codeine; codeine phosphate; and eggs  or egg-derived products.  MEDICATIONS:  Current Outpatient Prescriptions  Medication Sig Dispense Refill  . albuterol (PROVENTIL HFA;VENTOLIN HFA) 108 (90 BASE) MCG/ACT inhaler Inhale 2 puffs into the lungs every 6 (six) hours as needed.      . Alum & Mag Hydroxide-Simeth (MAGIC MOUTHWASH W/LIDOCAINE) SOLN Take 5 mLs by mouth 4 (four) times daily as needed.  60 mL  0  . aspirin EC 81 MG tablet Take 162 mg by mouth every 6 (six) hours as needed. For blood clot symptoms      . b complex vitamins tablet Take 1 tablet by mouth daily.      . calcium-vitamin D (OSCAL WITH D) 500-200 MG-UNIT per tablet Take 1 tablet by mouth daily.      . ciprofloxacin (CIPRO) 500 MG tablet Take 500 mg by mouth 2 (two) times daily. As needed      . dexamethasone (DECADRON) 4 MG tablet Take 2 tablets (8 mg total) by mouth 2 (two) times daily with a meal. Take daily starting the day after chemotherapy for 2 days. Take with food.  30 tablet  1  . dexamethasone (DECADRON) 4 MG tablet Take 2 tablets (8 mg total) by mouth 2 (two) times daily with a meal. Take two tablets twice a day the day before chemo, then two tablets twice a day for three days after chemo.  90 tablet  2  . fish oil-omega-3 fatty acids 1000 MG capsule Take 2 g by mouth daily.      . Flaxseed,  Linseed, (FLAX SEEDS PO) Take 1 tablet by mouth daily.      . fluticasone (FLONASE) 50 MCG/ACT nasal spray Place 1 spray into the nose daily as needed. For allergies      . glucosamine-chondroitin 500-400 MG tablet Take 1 tablet by mouth daily.      Marland Kitchen HYDROcodone-acetaminophen (NORCO/VICODIN) 5-325 MG per tablet Take 1 tablet by mouth every 6 (six) hours as needed. Pain      . lidocaine-prilocaine (EMLA) cream Apply topically as needed.  30 g  5  . LORazepam (ATIVAN) 0.5 MG tablet Take 1 tablet (0.5 mg total) by mouth every 6 (six) hours as needed (Nausea or vomiting).  30 tablet  0  . metoprolol succinate (TOPROL XL) 50 MG 24 hr tablet Take 1 tablet (50 mg total) by  mouth daily. Take with or immediately following a meal.  30 tablet  3  . Multiple Vitamin (MULTIVITAMIN WITH MINERALS) TABS Take 1 tablet by mouth daily.      Marland Kitchen omeprazole (PRILOSEC) 20 MG capsule Take 20 mg by mouth daily as needed. For heartburn      . ondansetron (ZOFRAN) 8 MG tablet Take 1 tablet (8 mg total) by mouth 2 (two) times daily. Take two times a day starting the day after chemo for 2 days. Then take two times a day as needed for nausea or vomiting.  30 tablet  1  . oxyCODONE-acetaminophen (ROXICET) 5-325 MG per tablet Take 1 tablet by mouth every 4 (four) hours as needed for pain.  15 tablet  0  . prochlorperazine (COMPAZINE) 10 MG tablet Take 1 tablet (10 mg total) by mouth every 6 (six) hours as needed (Nausea or vomiting).  30 tablet  1  . prochlorperazine (COMPAZINE) 25 MG suppository Place 1 suppository (25 mg total) rectally every 12 (twelve) hours as needed for nausea.  12 suppository  3  . valACYclovir (VALTREX) 500 MG tablet Take 1 tablet (500 mg total) by mouth 3 (three) times daily.  90 tablet  6  . gabapentin (NEURONTIN) 100 MG capsule Take 1 capsule (100 mg total) by mouth 2 (two) times daily.  180 capsule  6    SURGICAL HISTORY:  Past Surgical History  Procedure Date  . Dilation and curettage of uterus     x3  . Tonsillectomy   . Colonoscopy   . Portacath placement 07/21/2012    Procedure: INSERTION PORT-A-CATH;  Surgeon: Emelia Loron, MD;  Location: [facility];  Service: General;  Laterality: Left;    REVIEW OF SYSTEMS:   General: fatigue (+), night sweats (-), fever (-), pain (+) Lymph: palpable nodes (-) HEENT: vision changes (-), mucositis (-), gum bleeding (-), epistaxis (-) Cardiovascular: chest pain (-), palpitations (-) Pulmonary: shortness of breath (-), dyspnea on exertion (-), cough (-), hemoptysis (-) GI:  Early satiety (-), melena (-), dysphagia (-), nausea/vomiting (-), diarrhea (-) GU: dysuria (-), hematuria (-), incontinence  (-) Musculoskeletal: joint swelling (-), joint pain (-), back pain (-) Neuro: weakness (-), numbness (+), headache (-), confusion (-) Skin: Rash (-), lesions (-), dryness (-) Psych: depression (-), suicidal/homicidal ideation (-), feeling of hopelessness (-)   PHYSICAL EXAMINATION:  BP 138/75  Pulse 92  Temp 98.4 F (36.9 C) (Oral)  Resp 20  Ht 5\' 1"  (1.549 m)  Wt 143 lb 3.2 oz (64.955 kg)  BMI 27.06 kg/m2 General: Patient is a well appearing female in no acute distress HEENT: PERRLA, sclerae anicteric no conjunctival pallor, MMM Neck: supple, no  palpable adenopathy Lungs: clear to auscultation bilaterally, no wheezes, rhonchi, or rales Cardiovascular: regular rate rhythm, S1, S2, no murmurs, rubs or gallops Abdomen: Soft, non-tender, non-distended, normoactive bowel sounds, no HSM Extremities: warm and well perfused, no clubbing, cyanosis, or edema Skin: No rashes or lesions Neuro: Non-focal, gait, tandem gait intact Right breast reveals improvement in  Dimpling of the skin nipple is now more prominent and I am not able to feel a mass ECOG PERFORMANCE STATUS: 0 - Asymptomatic   LABORATORY DATA: Lab Results  Component Value Date   WBC 13.4* 11/05/2012   HGB 10.7* 11/05/2012   HCT 31.7* 11/05/2012   MCV 97.5 11/05/2012   PLT 213 11/05/2012      Chemistry      Component Value Date/Time   NA 138 10/27/2012 0941   NA 137 09/15/2012 1330   K 4.0 10/27/2012 0941   K 3.4* 09/15/2012 1330   CL 102 10/27/2012 0941   CL 100 09/15/2012 1330   CO2 27 10/27/2012 0941   CO2 27 09/15/2012 1330   BUN 19.0 10/27/2012 0941   BUN 9 09/15/2012 1330   CREATININE 0.8 10/27/2012 0941   CREATININE 0.66 09/15/2012 1330      Component Value Date/Time   CALCIUM 9.3 10/27/2012 0941   CALCIUM 9.3 09/15/2012 1330   ALKPHOS 68 10/27/2012 0941   ALKPHOS 96 09/10/2012 1401   AST 15 10/27/2012 0941   AST 12 09/10/2012 1401   ALT 33 10/27/2012 0941   ALT 16 09/10/2012 1401   BILITOT 0.67  10/27/2012 0941   BILITOT 0.9 09/10/2012 1401       RADIOGRAPHIC STUDIES:  ASSESSMENT: 66 year old female with  #1 inflammatory right breast cancer with positive lymph nodes the tumor is ER negative PR negative HER-2/neu positive. Patient is  to receive neoadjuvant chemotherapy intially Consisting of FEC q 2 weeks x 4 cycles. Then she will receive taxol and herceptin q week x 12 weeks.Once patient completes her chemotherapy then we will get restaging MRIs performed to evaluate response to therapy. Patient does understand that she will most likely need a mastectomy because she does have inflammatory breast cancer.  #2 Tachycardia  #3 Peripheral neuropathy  PLAN:   #1 Ms. Bagnell continues to do well.  Due to her numbness, we will hold her Taxol today.  I have started her on Neurontin twice a day, and she will call us if she has any problems with this.  Proceed with Herceptin only.   #2 Ms. Panetta does continues to have tachycardia.  She was started on Toprol XL and is tolerating it well.    #3 I will see her back next week for cycle 9.    All questions were answered. The patient knows to call the clinic with any problems, questions or concerns. We can certainly see the patient much sooner if necessary.  I spent 25 minutes counseling the patient face to face. The total time spent in the appointment was 30 minutes.  This case was reviewed with Dr. Welton Flakes.  Cherie Ouch Lyn Hollingshead, NP Medical Oncology [facility] Phone: [phone] 11/05/2012, 11:45 AM

## 2012-11-05 NOTE — Patient Instructions (Addendum)
Doing well.  I have prescribed Neurontin (Gabapentin) for you to take for numbness.  Take one tablet twice daily.  We will hold the Taxol today.    Gabapentin capsules or tablets What is this medicine? GABAPENTIN (GA ba pen tin) is used to control partial seizures in adults with epilepsy. It is also used to treat certain types of nerve pain. This medicine may be used for other purposes; ask your health care provider or pharmacist if you have questions. What should I tell my health care provider before I take this medicine? They need to know if you have any of these conditions: -kidney disease -suicidal thoughts, plans, or attempt; a previous suicide attempt by you or a family member -an unusual or allergic reaction to gabapentin, other medicines, foods, dyes, or preservatives -pregnant or trying to get pregnant -breast-feeding How should I use this medicine? Take this medicine by mouth. Swallow it with a drink of water. Follow the directions on the prescription label. If this medicine upsets your stomach, take it with food or milk. Take your medicine at regular intervals. Do not take it more often than directed. If you are directed to break the 600 or 800 mg tablets in half as part of your dose, the extra half tablet should be used for the next dose. If you have not used the extra half tablet within 3 days, it should be thrown away. A special MedGuide will be given to you by the pharmacist with each prescription and refill. Be sure to read this information carefully each time. Talk to your pediatrician regarding the use of this medicine in children. Special care may be needed. Overdosage: If you think you have taken too much of this medicine contact a poison control center or emergency room at once. NOTE: This medicine is only for you. Do not share this medicine with others. What if I miss a dose? If you miss a dose, take it as soon as you can. If it is almost time for your next dose, take only  that dose. Do not take double or extra doses. What may interact with this medicine? -antacids -hydrocodone -morphine -naproxen -sevelamer This list may not describe all possible interactions. Give your health care provider a list of all the medicines, herbs, non-prescription drugs, or dietary supplements you use. Also tell them if you smoke, drink alcohol, or use illegal drugs. Some items may interact with your medicine. What should I watch for while using this medicine? Visit your doctor or health care professional for regular checks on your progress. You may want to keep a record at home of how you feel your condition is responding to treatment. You may want to share this information with your doctor or health care professional at each visit. You should contact your doctor or health care professional if your seizures get worse or if you have any new types of seizures. Do not stop taking this medicine or any of your seizure medicines unless instructed by your doctor or health care professional. Stopping your medicine suddenly can increase your seizures or their severity. Wear a medical identification bracelet or chain if you are taking this medicine for seizures, and carry a card that lists all your medications. You may get drowsy, dizzy, or have blurred vision. Do not drive, use machinery, or do anything that needs mental alertness until you know how this medicine affects you. To reduce dizzy or fainting spells, do not sit or stand up quickly, especially if you are an  older patient. Alcohol can increase drowsiness and dizziness. Avoid alcoholic drinks. Your mouth may get dry. Chewing sugarless gum or sucking hard candy, and drinking plenty of water will help. The use of this medicine may increase the chance of suicidal thoughts or actions. Pay special attention to how you are responding while on this medicine. Any worsening of mood, or thoughts of suicide or dying should be reported to your health  care professional right away. Women who become pregnant while using this medicine may enroll in the [place] American Antiepileptic Drug Pregnancy Registry by calling [phone]. This registry collects information about the safety of antiepileptic drug use during pregnancy. What side effects may I notice from receiving this medicine? Side effects that you should report to your doctor or health care professional as soon as possible: -allergic reactions like skin rash, itching or hives, swelling of the face, lips, or tongue -worsening of mood, thoughts or actions of suicide or dying Side effects that usually do not require medical attention (report to your doctor or health care professional if they continue or are bothersome): -constipation -difficulty walking or controlling muscle movements -nausea -slurred speech -tremors -weight gain This list may not describe all possible side effects. Call your doctor for medical advice about side effects. You may report side effects to FDA at 1-800-FDA-1088. Where should I keep my medicine? Keep out of reach of children. Store at room temperature between 15 and 30 degrees C (59 and 86 degrees F). Throw away any unused medicine after the expiration date. NOTE: This sheet is a summary. It may not cover all possible information. If you have questions about this medicine, talk to your doctor, pharmacist, or health care provider.  2012, Elsevier/Gold Standard. (07/16/2010 6:06:26 PM)

## 2012-11-10 ENCOUNTER — Ambulatory Visit: Payer: Medicare Other | Admitting: Nurse Practitioner

## 2012-11-12 ENCOUNTER — Telehealth: Payer: Self-pay | Admitting: *Deleted

## 2012-11-12 ENCOUNTER — Encounter: Payer: Self-pay | Admitting: Adult Health

## 2012-11-12 ENCOUNTER — Ambulatory Visit (HOSPITAL_BASED_OUTPATIENT_CLINIC_OR_DEPARTMENT_OTHER): Payer: Medicare Other | Admitting: Adult Health

## 2012-11-12 ENCOUNTER — Ambulatory Visit (HOSPITAL_BASED_OUTPATIENT_CLINIC_OR_DEPARTMENT_OTHER): Payer: Medicare Other

## 2012-11-12 ENCOUNTER — Other Ambulatory Visit (HOSPITAL_BASED_OUTPATIENT_CLINIC_OR_DEPARTMENT_OTHER): Payer: Medicare Other | Admitting: Lab

## 2012-11-12 VITALS — BP 151/73 | HR 87 | Temp 97.7°F | Resp 20 | Ht 61.0 in | Wt 144.5 lb

## 2012-11-12 DIAGNOSIS — C50919 Malignant neoplasm of unspecified site of unspecified female breast: Secondary | ICD-10-CM

## 2012-11-12 DIAGNOSIS — G609 Hereditary and idiopathic neuropathy, unspecified: Secondary | ICD-10-CM

## 2012-11-12 DIAGNOSIS — C50119 Malignant neoplasm of central portion of unspecified female breast: Secondary | ICD-10-CM

## 2012-11-12 DIAGNOSIS — Z5112 Encounter for antineoplastic immunotherapy: Secondary | ICD-10-CM

## 2012-11-12 DIAGNOSIS — C773 Secondary and unspecified malignant neoplasm of axilla and upper limb lymph nodes: Secondary | ICD-10-CM

## 2012-11-12 LAB — CBC WITH DIFFERENTIAL/PLATELET
Basophils Absolute: 0 10*3/uL (ref 0.0–0.1)
EOS%: 0 % (ref 0.0–7.0)
Eosinophils Absolute: 0 10*3/uL (ref 0.0–0.5)
HGB: 11.1 g/dL — ABNORMAL LOW (ref 11.6–15.9)
MCV: 97.3 fL (ref 79.5–101.0)
MONO%: 4.5 % (ref 0.0–14.0)
NEUT#: 11.6 10*3/uL — ABNORMAL HIGH (ref 1.5–6.5)
RBC: 3.35 10*6/uL — ABNORMAL LOW (ref 3.70–5.45)
RDW: 14.3 % (ref 11.2–14.5)
lymph#: 0.5 10*3/uL — ABNORMAL LOW (ref 0.9–3.3)
nRBC: 0 % (ref 0–0)

## 2012-11-12 LAB — COMPREHENSIVE METABOLIC PANEL (CC13)
ALT: 38 U/L (ref 0–55)
Albumin: 3.6 g/dL (ref 3.5–5.0)
Alkaline Phosphatase: 60 U/L (ref 40–150)
CO2: 26 mEq/L (ref 22–29)
Potassium: 4.3 mEq/L (ref 3.5–5.1)
Sodium: 137 mEq/L (ref 136–145)
Total Bilirubin: 0.4 mg/dL (ref 0.20–1.20)
Total Protein: 6.1 g/dL — ABNORMAL LOW (ref 6.4–8.3)

## 2012-11-12 MED ORDER — SODIUM CHLORIDE 0.9 % IJ SOLN
10.0000 mL | INTRAMUSCULAR | Status: DC | PRN
  Administered 2012-11-12: 10 mL
  Filled 2012-11-12: qty 10

## 2012-11-12 MED ORDER — ACETAMINOPHEN 325 MG PO TABS
650.0000 mg | ORAL_TABLET | Freq: Once | ORAL | Status: AC
  Administered 2012-11-12: 650 mg via ORAL

## 2012-11-12 MED ORDER — HEPARIN SOD (PORK) LOCK FLUSH 100 UNIT/ML IV SOLN
500.0000 [IU] | Freq: Once | INTRAVENOUS | Status: AC | PRN
  Administered 2012-11-12: 500 [IU]
  Filled 2012-11-12: qty 5

## 2012-11-12 MED ORDER — SODIUM CHLORIDE 0.9 % IV SOLN
Freq: Once | INTRAVENOUS | Status: AC
  Administered 2012-11-12: 12:00:00 via INTRAVENOUS

## 2012-11-12 MED ORDER — DIPHENHYDRAMINE HCL 25 MG PO CAPS
50.0000 mg | ORAL_CAPSULE | Freq: Once | ORAL | Status: AC
  Administered 2012-11-12: 50 mg via ORAL

## 2012-11-12 MED ORDER — TRASTUZUMAB CHEMO INJECTION 440 MG
2.0000 mg/kg | Freq: Once | INTRAVENOUS | Status: AC
  Administered 2012-11-12: 126 mg via INTRAVENOUS
  Filled 2012-11-12: qty 6

## 2012-11-12 NOTE — Patient Instructions (Addendum)
Doing well.  We will stop your Taxol.  Proceed with Herceptin only today.  You will receive 5 more weeks of a different chemotherapy starting next week.  Take 3 tablets of Neurontin nightly.    Gemcitabine injection What is this medicine? GEMCITABINE (jem SIT a been) is a chemotherapy drug. This medicine is used to treat many types of cancer like breast cancer, lung cancer, pancreatic cancer, and ovarian cancer. This medicine may be used for other purposes; ask your health care provider or pharmacist if you have questions. What should I tell my health care provider before I take this medicine? They need to know if you have any of these conditions: -blood disorders -infection -kidney disease -liver disease -recent or ongoing radiation therapy -an unusual or allergic reaction to gemcitabine, other chemotherapy, other medicines, foods, dyes, or preservatives -pregnant or trying to get pregnant -breast-feeding How should I use this medicine? This drug is given as an infusion into a vein. It is administered in a hospital or clinic by a specially trained health care professional. Talk to your pediatrician regarding the use of this medicine in children. Special care may be needed. Overdosage: If you think you have taken too much of this medicine contact a poison control center or emergency room at once. NOTE: This medicine is only for you. Do not share this medicine with others. What if I miss a dose? It is important not to miss your dose. Call your doctor or health care professional if you are unable to keep an appointment. What may interact with this medicine? -medicines to increase blood counts like filgrastim, pegfilgrastim, sargramostim -some other chemotherapy drugs like cisplatin -vaccines Talk to your doctor or health care professional before taking any of these medicines: -acetaminophen -aspirin -ibuprofen -ketoprofen -naproxen This list may not describe all possible interactions.  Give your health care provider a list of all the medicines, herbs, non-prescription drugs, or dietary supplements you use. Also tell them if you smoke, drink alcohol, or use illegal drugs. Some items may interact with your medicine. What should I watch for while using this medicine? Visit your doctor for checks on your progress. This drug may make you feel generally unwell. This is not uncommon, as chemotherapy can affect healthy cells as well as cancer cells. Report any side effects. Continue your course of treatment even though you feel ill unless your doctor tells you to stop. In some cases, you may be given additional medicines to help with side effects. Follow all directions for their use. Call your doctor or health care professional for advice if you get a fever, chills or sore throat, or other symptoms of a cold or flu. Do not treat yourself. This drug decreases your body's ability to fight infections. Try to avoid being around people who are sick. This medicine may increase your risk to bruise or bleed. Call your doctor or health care professional if you notice any unusual bleeding. Be careful brushing and flossing your teeth or using a toothpick because you may get an infection or bleed more easily. If you have any dental work done, tell your dentist you are receiving this medicine. Avoid taking products that contain aspirin, acetaminophen, ibuprofen, naproxen, or ketoprofen unless instructed by your doctor. These medicines may hide a fever. Women should inform their doctor if they wish to become pregnant or think they might be pregnant. There is a potential for serious side effects to an unborn child. Talk to your health care professional or pharmacist for more  information. Do not breast-feed an infant while taking this medicine. What side effects may I notice from receiving this medicine? Side effects that you should report to your doctor or health care professional as soon as  possible: -allergic reactions like skin rash, itching or hives, swelling of the face, lips, or tongue -low blood counts - this medicine may decrease the number of white blood cells, red blood cells and platelets. You may be at increased risk for infections and bleeding. -signs of infection - fever or chills, cough, sore throat, pain or difficulty passing urine -signs of decreased platelets or bleeding - bruising, pinpoint red spots on the skin, black, tarry stools, blood in the urine -signs of decreased red blood cells - unusually weak or tired, fainting spells, lightheadedness -breathing problems -chest pain -mouth sores -nausea and vomiting -pain, swelling, redness at site where injected -pain, tingling, numbness in the hands or feet -stomach pain -swelling of ankles, feet, hands -unusual bleeding Side effects that usually do not require medical attention (report to your doctor or health care professional if they continue or are bothersome): -constipation -diarrhea -hair loss -loss of appetite -stomach upset This list may not describe all possible side effects. Call your doctor for medical advice about side effects. You may report side effects to FDA at 1-800-FDA-1088. Where should I keep my medicine? This drug is given in a hospital or clinic and will not be stored at home. NOTE: This sheet is a summary. It may not cover all possible information. If you have questions about this medicine, talk to your doctor, pharmacist, or health care provider.  2012, Elsevier/Gold Standard. (03/28/2008 6:45:54 PM)

## 2012-11-12 NOTE — Progress Notes (Signed)
Per Lindsay-NP, no taxol today.  Herceptin only.

## 2012-11-12 NOTE — Telephone Encounter (Signed)
Made patient appointment for 12-10-2012 and 12-17-2012  [facility] email to set up patient's treatment

## 2012-11-12 NOTE — Patient Instructions (Addendum)
[facility] Discharge Instructions for Patients Receiving Chemotherapy  Today you received the following chemotherapy agents Herceptin.  To help prevent nausea and vomiting after your treatment, we encourage you to take your nausea medication as prescribed.   If you develop nausea and vomiting that is not controlled by your nausea medication, call the clinic. If it is after clinic hours your family physician or the after hours number for the clinic or go to the Emergency Department.   BELOW ARE SYMPTOMS THAT SHOULD BE REPORTED IMMEDIATELY:  *FEVER GREATER THAN 100.5 F  *CHILLS WITH OR WITHOUT FEVER  NAUSEA AND VOMITING THAT IS NOT CONTROLLED WITH YOUR NAUSEA MEDICATION  *UNUSUAL SHORTNESS OF BREATH  *UNUSUAL BRUISING OR BLEEDING  TENDERNESS IN MOUTH AND THROAT WITH OR WITHOUT PRESENCE OF ULCERS  *URINARY PROBLEMS  *BOWEL PROBLEMS  UNUSUAL RASH Items with * indicate a potential emergency and should be followed up as soon as possible.  Feel free to call the clinic you have any questions or concerns. The clinic phone number is [phone].   I have been informed and understand all the instructions given to me. I know to contact the clinic, my physician, or go to the Emergency Department if any problems should occur. I do not have any questions at this time, but understand that I may call the clinic during office hours   should I have any questions or need assistance in obtaining follow up care.    __________________________________________  _____________  __________ Signature of Patient or Authorized Representative            Date                   Time    __________________________________________ Nurse's Signature    

## 2012-11-12 NOTE — Progress Notes (Signed)
OFFICE PROGRESS NOTE  CC  [name], MD [address] [place] [address]  DIAGNOSIS: 66 year old female with new diagnosis of inflammatory breast cancer  PRIOR THERAPY: #1 patient was originally seen in my clinic for new diagnosis of inflammatory breast cancer she was referred by Dr. Emelia Loron. She had a mammogram performed That showed an abnormality. That was biopsied and it showed an invasive mammary carcinoma with lymphovascular invasion grade 3 ER negative PR negative HER-2/neu positive.  #2 patient has gone on to have MRI of the breasts performed the MRI shows diffuse right breast neoplasm on a large level I right axillary lymph nodes compatible with lymphatic spread.Patient has had a biopsy of the right axillary lymph node that is compatible with invasive mammary carcinoma.  #3 patient  began neoadjuvant FEC 100 with day 2 Neulasta on 07/23/2012  #4 Weekly neoadjuvant Taxol and Herceptin starting 09/17/12, patient developed neuropathies, and taxol was discontinued early and patient received 2 weeks of herceptin only.    #5 Weekly Gemcitabine and Herceptin starting on 11/19/12  CURRENT THERAPY: Herceptin  INTERVAL HISTORY: Jessica Wells 66 y.o. female returns for chemotherapy today. Her numbness is no better or worse despite the Nuerontin change last week.  She feels like she has developed permanent numbness in her feet.  Her fingertips however are feeling improved.   She hasn't lost motor function.  She's experienced intermittent swelling in her right lower extremity.  It improves with elevation, theres no erythema or leg pain.  Otherwise she's doing well and w/o questions, concerns.    MEDICAL HISTORY: Past Medical History  Diagnosis Date  . Asthma   . GERD (gastroesophageal reflux disease)   . Dysrhythmia     PAT-sees dr Laurence Compton meds  . Breast cancer     inflammatory right breast ca  . Allergy   . PAT (paroxysmal atrial tachycardia)     hx   . Arthritis     osteopenia,knees  . PONV (postoperative nausea and vomiting) 09-13-12    severe, with Port-a-cath, was managed without PONV    ALLERGIES:  is allergic to anesthetics, amide; bactrim; sulfa antibiotics; codeine; codeine phosphate; and eggs or egg-derived products.  MEDICATIONS:  Current Outpatient Prescriptions  Medication Sig Dispense Refill  . albuterol (PROVENTIL HFA;VENTOLIN HFA) 108 (90 BASE) MCG/ACT inhaler Inhale 2 puffs into the lungs every 6 (six) hours as needed.      . Alum & Mag Hydroxide-Simeth (MAGIC MOUTHWASH W/LIDOCAINE) SOLN Take 5 mLs by mouth 4 (four) times daily as needed.  60 mL  0  . aspirin EC 81 MG tablet Take 162 mg by mouth every 6 (six) hours as needed. For blood clot symptoms      . b complex vitamins tablet Take 1 tablet by mouth daily.      . calcium-vitamin D (OSCAL WITH D) 500-200 MG-UNIT per tablet Take 1 tablet by mouth daily.      . ciprofloxacin (CIPRO) 500 MG tablet Take 500 mg by mouth 2 (two) times daily. As needed      . dexamethasone (DECADRON) 4 MG tablet Take 2 tablets (8 mg total) by mouth 2 (two) times daily with a meal. Take two tablets twice a day the day before chemo, then two tablets twice a day for three days after chemo.  90 tablet  2  . fish oil-omega-3 fatty acids 1000 MG capsule Take 2 g by mouth daily.      . Flaxseed, Linseed, (FLAX SEEDS PO) Take 1  tablet by mouth daily.      . fluticasone (FLONASE) 50 MCG/ACT nasal spray Place 1 spray into the nose daily as needed. For allergies      . gabapentin (NEURONTIN) 100 MG capsule Take 1 capsule (100 mg total) by mouth 2 (two) times daily.  180 capsule  6  . glucosamine-chondroitin 500-400 MG tablet Take 1 tablet by mouth daily.      Marland Kitchen HYDROcodone-acetaminophen (NORCO/VICODIN) 5-325 MG per tablet Take 1 tablet by mouth every 6 (six) hours as needed. Pain      . lidocaine-prilocaine (EMLA) cream Apply topically as needed.  30 g  5  . metoprolol succinate (TOPROL XL) 50 MG 24  hr tablet Take 1 tablet (50 mg total) by mouth daily. Take with or immediately following a meal.  30 tablet  3  . Multiple Vitamin (MULTIVITAMIN WITH MINERALS) TABS Take 1 tablet by mouth daily.      Marland Kitchen omeprazole (PRILOSEC) 20 MG capsule Take 20 mg by mouth daily as needed. For heartburn      . oxyCODONE-acetaminophen (ROXICET) 5-325 MG per tablet Take 1 tablet by mouth every 4 (four) hours as needed for pain.  15 tablet  0  . valACYclovir (VALTREX) 500 MG tablet Take 1 tablet (500 mg total) by mouth 3 (three) times daily.  90 tablet  6   No current facility-administered medications for this visit.   Facility-Administered Medications Ordered in Other Visits  Medication Dose Route Frequency Provider Last Rate Last Dose  . sodium chloride 0.9 % injection 10 mL  10 mL Intracatheter PRN Victorino December, MD   10 mL at 11/12/12 1247    SURGICAL HISTORY:  Past Surgical History  Procedure Date  . Dilation and curettage of uterus     x3  . Tonsillectomy   . Colonoscopy   . Portacath placement 07/21/2012    Procedure: INSERTION PORT-A-CATH;  Surgeon: Emelia Loron, MD;  Location: [facility];  Service: General;  Laterality: Left;    REVIEW OF SYSTEMS:   General: fatigue (+), night sweats (-), fever (-), pain (+) Lymph: palpable nodes (-) HEENT: vision changes (-), mucositis (-), gum bleeding (-), epistaxis (-) Cardiovascular: chest pain (-), palpitations (-) Pulmonary: shortness of breath (-), dyspnea on exertion (-), cough (-), hemoptysis (-) GI:  Early satiety (-), melena (-), dysphagia (-), nausea/vomiting (-), diarrhea (-) GU: dysuria (-), hematuria (-), incontinence (-) Musculoskeletal: joint swelling (-), joint pain (-), back pain (-) Neuro: weakness (-), numbness (+), headache (-), confusion (-) Skin: Rash (-), lesions (-), dryness (-) Psych: depression (-), suicidal/homicidal ideation (-), feeling of hopelessness (-)   PHYSICAL EXAMINATION:  BP 151/73  Pulse 87   Temp 97.7 F (36.5 C) (Oral)  Resp 20  Ht 5\' 1"  (1.549 m)  Wt 144 lb 8 oz (65.545 kg)  BMI 27.30 kg/m2 General: Patient is a well appearing female in no acute distress HEENT: PERRLA, sclerae anicteric no conjunctival pallor, MMM Neck: supple, no palpable adenopathy Lungs: clear to auscultation bilaterally, no wheezes, rhonchi, or rales Cardiovascular: regular rate rhythm, S1, S2, no murmurs, rubs or gallops Abdomen: Soft, non-tender, non-distended, normoactive bowel sounds, no HSM Extremities: warm and well perfused, no clubbing, cyanosis, or edema Skin: No rashes or lesions Neuro: Non-focal, gait, tandem gait intact Right breast reveals improvement in  Dimpling of the skin nipple is now more prominent and I am not able to feel a mass ECOG PERFORMANCE STATUS: 0 - Asymptomatic   LABORATORY DATA: Lab  Results  Component Value Date   WBC 12.6* 11/12/2012   HGB 11.1* 11/12/2012   HCT 32.6* 11/12/2012   MCV 97.3 11/12/2012   PLT 191 11/12/2012      Chemistry      Component Value Date/Time   NA 137 11/12/2012 1037   NA 137 09/15/2012 1330   K 4.3 11/12/2012 1037   K 3.4* 09/15/2012 1330   CL 102 11/12/2012 1037   CL 100 09/15/2012 1330   CO2 26 11/12/2012 1037   CO2 27 09/15/2012 1330   BUN 25.0 11/12/2012 1037   BUN 9 09/15/2012 1330   CREATININE 0.8 11/12/2012 1037   CREATININE 0.66 09/15/2012 1330      Component Value Date/Time   CALCIUM 9.0 11/12/2012 1037   CALCIUM 9.3 09/15/2012 1330   ALKPHOS 60 11/12/2012 1037   ALKPHOS 96 09/10/2012 1401   AST 15 11/12/2012 1037   AST 12 09/10/2012 1401   ALT 38 11/12/2012 1037   ALT 16 09/10/2012 1401   BILITOT 0.40 11/12/2012 1037   BILITOT 0.9 09/10/2012 1401       RADIOGRAPHIC STUDIES:  ASSESSMENT: 66 year old female with  #1 inflammatory right breast cancer with positive lymph nodes the tumor is ER negative PR negative HER-2/neu positive. Patient is  to receive neoadjuvant chemotherapy intially Consisting of FEC  q 2 weeks x 4 cycles. Then she will receive taxol and herceptin q week x 12 weeks.Once patient completes her chemotherapy then we will get restaging MRIs performed to evaluate response to therapy. Patient does understand that she will most likely need a mastectomy because she does have inflammatory breast cancer.  #2 Tachycardia  #3 Peripheral neuropathy  PLAN:   #1 Jessica Wells continues to do well. Due to her numbness we will stop giving her the Taxol.  This was discussed with Dr. Welton Flakes, and we will give her 5 cycles of weekly Gemzar and Herceptin starting next week.  I have increased her Neurontin to 300  Mg qhs.   #2 Jessica Wells tachycardia is improved.  She was started on Toprol XL and is tolerating it well.    #3 I will see her back next week to begin Gemzar Herceptin.    All questions were answered. The patient knows to call the clinic with any problems, questions or concerns. We can certainly see the patient much sooner if necessary.  I spent 25 minutes counseling the patient face to face. The total time spent in the appointment was 30 minutes.  This case was reviewed with Dr. Welton Flakes.  Cherie Ouch Lyn Hollingshead, NP Medical Oncology [facility] Phone: [phone] 11/12/2012, 2:57 PM

## 2012-11-18 ENCOUNTER — Ambulatory Visit (HOSPITAL_BASED_OUTPATIENT_CLINIC_OR_DEPARTMENT_OTHER)
Admission: RE | Admit: 2012-11-18 | Discharge: 2012-11-18 | Disposition: A | Discharge status: Home / Self Care | Payer: Medicare Other | Source: Ambulatory Visit | Attending: Internal Medicine | Admitting: Internal Medicine

## 2012-11-18 ENCOUNTER — Other Ambulatory Visit: Payer: Self-pay | Admitting: *Deleted

## 2012-11-18 ENCOUNTER — Ambulatory Visit (HOSPITAL_COMMUNITY)
Admission: RE | Admit: 2012-11-18 | Discharge: 2012-11-18 | Disposition: A | Discharge status: Home / Self Care | Payer: Medicare Other | Source: Ambulatory Visit | Attending: Internal Medicine | Admitting: Internal Medicine

## 2012-11-18 VITALS — BP 142/78 | HR 70 | Wt 147.2 lb

## 2012-11-18 DIAGNOSIS — M1710 Unilateral primary osteoarthritis, unspecified knee: Secondary | ICD-10-CM | POA: Insufficient documentation

## 2012-11-18 DIAGNOSIS — C50119 Malignant neoplasm of central portion of unspecified female breast: Secondary | ICD-10-CM

## 2012-11-18 DIAGNOSIS — I059 Rheumatic mitral valve disease, unspecified: Secondary | ICD-10-CM | POA: Insufficient documentation

## 2012-11-18 DIAGNOSIS — C50919 Malignant neoplasm of unspecified site of unspecified female breast: Secondary | ICD-10-CM | POA: Diagnosis not present

## 2012-11-18 DIAGNOSIS — K219 Gastro-esophageal reflux disease without esophagitis: Secondary | ICD-10-CM | POA: Diagnosis not present

## 2012-11-18 DIAGNOSIS — J45909 Unspecified asthma, uncomplicated: Secondary | ICD-10-CM | POA: Diagnosis not present

## 2012-11-18 DIAGNOSIS — I471 Supraventricular tachycardia: Secondary | ICD-10-CM | POA: Diagnosis not present

## 2012-11-18 NOTE — Assessment & Plan Note (Addendum)
I reviewed echos personally. EF and Doppler parameters stable. No HF on exam. Continue Herceptin. Given that she will receive Herceptin weekly for 5 weeks will do echo in 2 months (instead of 3). Watch edema closely - suspect it is from steroids.

## 2012-11-18 NOTE — Assessment & Plan Note (Signed)
Improved with Toprol. Given rate of 150 bpm, I wonder if this is AFL. Will refer to EP for further evaluation. Would not anticoagulate at this time.

## 2012-11-18 NOTE — Addendum Note (Signed)
Encounter addended by: Noralee Space, RN on: 11/18/2012  9:46 AM<BR>     Documentation filed: Patient Instructions Section, Orders

## 2012-11-18 NOTE — Progress Notes (Signed)
Oncologist: Dr Welton Flakes General Surgeon: Dr Dwain Sarna  HPI: 66 year old female with a history of tachycardia, inflammatory right breast cancer with positive lymph nodes the tumor is ER negative PR negative HER-2/neu positive. Plans to receive neoadjuvant chemotherapy intially consisting of FEC q 2 weeks x 4 cycles. She is now completing taxol and herceptin q week x 12 weeks.  Echos: 07/22/12 EF 60-65% lat s' 9.7 11/18/12 EF 55-60% lat s' 9.9 (reviewed personally)  She returns for follow up today.  Event monitor placed for tachycardia. (she has long h/o palpitations). Multiple episodes of PSVT noted at 150 bpm (? AFL). We started Toprol 50. Still gets "fullness" from this but doesn't feel like it racing. Says these episodes occur several times per week. Last seconds and goes away.        Taxol caused neuropathy therefore this is stopped.  Will start gemzar and Herceptin weekly for 5 weeks.  Will start tomorrow.  Rt ankle edema.  Walks everyday.  Planting trees right now.     Past Medical History  Diagnosis Date  . Asthma   . GERD (gastroesophageal reflux disease)   . Dysrhythmia     PAT-sees dr Laurence Compton meds  . Breast cancer     inflammatory right breast ca  . Allergy   . PAT (paroxysmal atrial tachycardia)     hx  . Arthritis     osteopenia,knees  . PONV (postoperative nausea and vomiting) 09-13-12    severe, with Port-a-cath, was managed without PONV    Current Outpatient Prescriptions  Medication Sig Dispense Refill  . albuterol (PROVENTIL HFA;VENTOLIN HFA) 108 (90 BASE) MCG/ACT inhaler Inhale 2 puffs into the lungs every 6 (six) hours as needed.      . Alum & Mag Hydroxide-Simeth (MAGIC MOUTHWASH W/LIDOCAINE) SOLN Take 5 mLs by mouth 4 (four) times daily as needed.  60 mL  0  . aspirin EC 81 MG tablet Take 162 mg by mouth every 6 (six) hours as needed. For blood clot symptoms      . b complex vitamins tablet Take 1 tablet by mouth daily.      . calcium-vitamin D (OSCAL WITH D)  500-200 MG-UNIT per tablet Take 1 tablet by mouth daily.      . ciprofloxacin (CIPRO) 500 MG tablet Take 500 mg by mouth 2 (two) times daily. As needed      . dexamethasone (DECADRON) 4 MG tablet Take 2 tablets (8 mg total) by mouth 2 (two) times daily with a meal. Take two tablets twice a day the day before chemo, then two tablets twice a day for three days after chemo.  90 tablet  2  . fish oil-omega-3 fatty acids 1000 MG capsule Take 2 g by mouth daily.      . Flaxseed, Linseed, (FLAX SEEDS PO) Take 1 tablet by mouth daily.      . fluticasone (FLONASE) 50 MCG/ACT nasal spray Place 1 spray into the nose daily as needed. For allergies      . gabapentin (NEURONTIN) 100 MG capsule Take 300 mg by mouth at bedtime.      Marland Kitchen glucosamine-chondroitin 500-400 MG tablet Take 1 tablet by mouth daily.      Marland Kitchen HYDROcodone-acetaminophen (NORCO/VICODIN) 5-325 MG per tablet Take 1 tablet by mouth every 6 (six) hours as needed. Pain      . lidocaine-prilocaine (EMLA) cream Apply topically as needed.  30 g  5  . metoprolol succinate (TOPROL XL) 50 MG 24 hr tablet Take 1 tablet (  50 mg total) by mouth daily. Take with or immediately following a meal.  30 tablet  3  . Multiple Vitamin (MULTIVITAMIN WITH MINERALS) TABS Take 1 tablet by mouth daily.      Marland Kitchen omeprazole (PRILOSEC) 20 MG capsule Take 20 mg by mouth daily as needed. For heartburn      . oxyCODONE-acetaminophen (ROXICET) 5-325 MG per tablet Take 1 tablet by mouth every 4 (four) hours as needed for pain.  15 tablet  0  . valACYclovir (VALTREX) 500 MG tablet Take 1 tablet (500 mg total) by mouth 3 (three) times daily.  90 tablet  6     Allergies  Allergen Reactions  . Anesthetics, Amide Nausea And Vomiting    Projectile vomitting-Nausea- with 24hrs of dry heaves. With any anesthetics  . Bactrim (Sulfamethoxazole W-Trimethoprim) Other (See Comments)    Massive diarrhea, nausea vomiting, platelets dropped, hemoglobin dropped, admitted to hospital  . Sulfa  Antibiotics Nausea And Vomiting and Other (See Comments)    Crazy feeling in head Life threatening reaction, decreased blood counts  . Codeine Nausea And Vomiting  . Codeine Phosphate Nausea And Vomiting  . Eggs Or Egg-Derived Products Hives    History   Social History  . Marital Status: Single    Spouse Name: N/A    Number of Children: 1  . Years of Education: N/A   Occupational History  .      retired Hydrographic surveyor   Social History Main Topics  . Smoking status: Former Smoker -- 35 years    Quit date: 07/21/1979  . Smokeless tobacco: Never Used  . Alcohol Use: No  . Drug Use: No  . Sexually Active: Not Currently   Other Topics Concern  . Not on file   Social History Narrative  . No narrative on file    Family History  Problem Relation Age of Onset  . Diabetes Mother   . Heart disease Mother   . Hypertension Mother   . Hyperlipidemia Mother   . Breast cancer Mother 51    lobular breast cancer  . Heart disease Maternal Aunt   . Heart disease Maternal Uncle   . Heart disease Maternal Grandmother     died in her 52s from heart disease  . Lung cancer Father 73    adenocarcinoma  . Cancer Paternal Uncle     dx in mid 31s with a cancer in the leg, died late 49s; grandpaternal half uncle    PHYSICAL EXAM: Filed Vitals:   11/18/12 0852  BP: 142/78  Pulse: 70   General:  Well appearing. No respiratory difficulty HEENT: no Neck: supple. no JVD. Carotids 2+ bilat; no bruits. No lymphadenopathy or thryomegaly appreciated. Cor: PMI nondisplaced. Regular rate & rhythm. No rubs, gallops or murmurs. Lungs: clear Abdomen: soft, nontender, nondistended. No hepatosplenomegaly. No bruits or masses. Good bowel sounds. Extremities: no cyanosis, clubbing, rash, edema Neuro: alert & oriented x 3, cranial nerves grossly intact. moves all 4 extremities w/o difficulty. Affect pleasant.  ECG: NSR 87 bpm. No pre-excitation    ASSESSMENT & PLAN:

## 2012-11-18 NOTE — Progress Notes (Signed)
  Echocardiogram 2D Echocardiogram has been performed.  Jessica Wells 11/18/2012, 8:56 AM

## 2012-11-18 NOTE — Patient Instructions (Addendum)
You have been referred to   Your physician has requested that you have an echocardiogram. Echocardiography is a painless test that uses sound waves to create images of your heart. It provides your doctor with information about the size and shape of your heart and how well your heart's chambers and valves are working. This procedure takes approximately one hour. There are no restrictions for this procedure.  IN 2 MONTHS  Your physician recommends that you schedule a follow-up appointment in: 2 months.

## 2012-11-19 ENCOUNTER — Ambulatory Visit (HOSPITAL_BASED_OUTPATIENT_CLINIC_OR_DEPARTMENT_OTHER): Payer: Medicare Other | Admitting: Oncology

## 2012-11-19 ENCOUNTER — Telehealth: Payer: Self-pay | Admitting: *Deleted

## 2012-11-19 ENCOUNTER — Other Ambulatory Visit (HOSPITAL_BASED_OUTPATIENT_CLINIC_OR_DEPARTMENT_OTHER): Payer: Medicare Other | Admitting: Lab

## 2012-11-19 ENCOUNTER — Ambulatory Visit (HOSPITAL_BASED_OUTPATIENT_CLINIC_OR_DEPARTMENT_OTHER): Payer: Medicare Other

## 2012-11-19 VITALS — BP 134/73 | HR 80 | Temp 98.2°F | Resp 20 | Ht 61.0 in | Wt 145.7 lb

## 2012-11-19 DIAGNOSIS — Z5111 Encounter for antineoplastic chemotherapy: Secondary | ICD-10-CM | POA: Diagnosis not present

## 2012-11-19 DIAGNOSIS — C773 Secondary and unspecified malignant neoplasm of axilla and upper limb lymph nodes: Secondary | ICD-10-CM | POA: Diagnosis not present

## 2012-11-19 DIAGNOSIS — G609 Hereditary and idiopathic neuropathy, unspecified: Secondary | ICD-10-CM | POA: Diagnosis not present

## 2012-11-19 DIAGNOSIS — C50919 Malignant neoplasm of unspecified site of unspecified female breast: Secondary | ICD-10-CM

## 2012-11-19 DIAGNOSIS — C50119 Malignant neoplasm of central portion of unspecified female breast: Secondary | ICD-10-CM

## 2012-11-19 DIAGNOSIS — R000 Tachycardia, unspecified: Secondary | ICD-10-CM

## 2012-11-19 LAB — CBC WITH DIFFERENTIAL/PLATELET
BASO%: 0.1 % (ref 0.0–2.0)
EOS%: 0.1 % (ref 0.0–7.0)
HCT: 33.3 % — ABNORMAL LOW (ref 34.8–46.6)
LYMPH%: 7.2 % — ABNORMAL LOW (ref 14.0–49.7)
MCH: 32.8 pg (ref 25.1–34.0)
MCHC: 33.9 g/dL (ref 31.5–36.0)
MONO#: 0.7 10*3/uL (ref 0.1–0.9)
NEUT%: 87.4 % — ABNORMAL HIGH (ref 38.4–76.8)
RBC: 3.44 10*6/uL — ABNORMAL LOW (ref 3.70–5.45)
WBC: 13.4 10*3/uL — ABNORMAL HIGH (ref 3.9–10.3)
lymph#: 1 10*3/uL (ref 0.9–3.3)
nRBC: 0 % (ref 0–0)

## 2012-11-19 LAB — COMPREHENSIVE METABOLIC PANEL (CC13)
ALT: 39 U/L (ref 0–55)
AST: 15 U/L (ref 5–34)
Alkaline Phosphatase: 53 U/L (ref 40–150)
BUN: 25 mg/dL (ref 7.0–26.0)
Chloride: 99 mEq/L (ref 98–107)
Creatinine: 0.8 mg/dL (ref 0.6–1.1)
Potassium: 4 mEq/L (ref 3.5–5.1)

## 2012-11-19 MED ORDER — ACETAMINOPHEN 325 MG PO TABS
650.0000 mg | ORAL_TABLET | Freq: Once | ORAL | Status: AC
  Administered 2012-11-19: 650 mg via ORAL

## 2012-11-19 MED ORDER — SODIUM CHLORIDE 0.9 % IV SOLN
800.0000 mg/m2 | Freq: Once | INTRAVENOUS | Status: AC
  Administered 2012-11-19: 1368 mg via INTRAVENOUS
  Filled 2012-11-19: qty 36

## 2012-11-19 MED ORDER — HEPARIN SOD (PORK) LOCK FLUSH 100 UNIT/ML IV SOLN
500.0000 [IU] | Freq: Once | INTRAVENOUS | Status: AC | PRN
Start: 2012-11-19 — End: 2012-11-19
  Administered 2012-11-19: 500 [IU]
  Filled 2012-11-19: qty 5

## 2012-11-19 MED ORDER — PROCHLORPERAZINE MALEATE 10 MG PO TABS
10.0000 mg | ORAL_TABLET | Freq: Once | ORAL | Status: AC
  Administered 2012-11-19: 10 mg via ORAL

## 2012-11-19 MED ORDER — TRASTUZUMAB CHEMO INJECTION 440 MG
2.0000 mg/kg | Freq: Once | INTRAVENOUS | Status: AC
  Administered 2012-11-19: 126 mg via INTRAVENOUS
  Filled 2012-11-19: qty 6

## 2012-11-19 MED ORDER — DIPHENHYDRAMINE HCL 25 MG PO CAPS
50.0000 mg | ORAL_CAPSULE | Freq: Once | ORAL | Status: AC
  Administered 2012-11-19: 50 mg via ORAL

## 2012-11-19 MED ORDER — SODIUM CHLORIDE 0.9 % IJ SOLN
10.0000 mL | INTRAMUSCULAR | Status: DC | PRN
  Administered 2012-11-19: 10 mL
  Filled 2012-11-19: qty 10

## 2012-11-19 MED ORDER — SODIUM CHLORIDE 0.9 % IV SOLN
Freq: Once | INTRAVENOUS | Status: AC
  Administered 2012-11-19: 12:00:00 via INTRAVENOUS

## 2012-11-19 NOTE — Patient Instructions (Addendum)
MRI of breasts in 1- 2 weeks  We will see you back in 1 week

## 2012-11-19 NOTE — Patient Instructions (Addendum)
[facility] Discharge Instructions for Patients Receiving Chemotherapy  Today you received the following chemotherapy agents Gemzar and Herceptin.  To help prevent nausea and vomiting after your treatment, we encourage you to take your nausea medication.   If you develop nausea and vomiting that is not controlled by your nausea medication, call the clinic. If it is after clinic hours your family physician or the after hours number for the clinic or go to the Emergency Department.   BELOW ARE SYMPTOMS THAT SHOULD BE REPORTED IMMEDIATELY:  *FEVER GREATER THAN 100.5 F  *CHILLS WITH OR WITHOUT FEVER  NAUSEA AND VOMITING THAT IS NOT CONTROLLED WITH YOUR NAUSEA MEDICATION  *UNUSUAL SHORTNESS OF BREATH  *UNUSUAL BRUISING OR BLEEDING  TENDERNESS IN MOUTH AND THROAT WITH OR WITHOUT PRESENCE OF ULCERS  *URINARY PROBLEMS  *BOWEL PROBLEMS  UNUSUAL RASH Items with * indicate a potential emergency and should be followed up as soon as possible.  One of the nurses will contact you 24 hours after your treatment. Please let the nurse know about any problems that you may have experienced. Feel free to call the clinic you have any questions or concerns. The clinic phone number is [phone].   I have been informed and understand all the instructions given to me. I know to contact the clinic, my physician, or go to the Emergency Department if any problems should occur. I do not have any questions at this time, but understand that I may call the clinic during office hours   should I have any questions or need assistance in obtaining follow up care.    __________________________________________  _____________  __________ Signature of Patient or Authorized Representative            Date                   Time    __________________________________________ Nurse's Signature

## 2012-11-19 NOTE — Telephone Encounter (Signed)
GAVE PATIENT INSTRUCTIONS ON RECEIVING HER APPOINTMENT FOR MRI OF THE BREAST

## 2012-11-22 ENCOUNTER — Telehealth: Payer: Self-pay | Admitting: *Deleted

## 2012-11-22 NOTE — Telephone Encounter (Signed)
LEFT MESSAGE ON HOME VOICE MAIL TO RETURN A CALL IF PT. HAD ANY PROBLEMS OR QUESTIONS.

## 2012-11-26 ENCOUNTER — Telehealth: Payer: Self-pay | Admitting: *Deleted

## 2012-11-26 ENCOUNTER — Ambulatory Visit (HOSPITAL_BASED_OUTPATIENT_CLINIC_OR_DEPARTMENT_OTHER): Payer: Medicare Other

## 2012-11-26 ENCOUNTER — Encounter: Payer: Self-pay | Admitting: Adult Health

## 2012-11-26 ENCOUNTER — Other Ambulatory Visit (HOSPITAL_BASED_OUTPATIENT_CLINIC_OR_DEPARTMENT_OTHER): Payer: Medicare Other | Admitting: Lab

## 2012-11-26 ENCOUNTER — Ambulatory Visit (HOSPITAL_BASED_OUTPATIENT_CLINIC_OR_DEPARTMENT_OTHER): Payer: Medicare Other | Admitting: Adult Health

## 2012-11-26 VITALS — BP 148/74 | HR 76 | Temp 98.1°F | Resp 20 | Ht 61.0 in | Wt 145.4 lb

## 2012-11-26 DIAGNOSIS — C50119 Malignant neoplasm of central portion of unspecified female breast: Secondary | ICD-10-CM

## 2012-11-26 DIAGNOSIS — C50919 Malignant neoplasm of unspecified site of unspecified female breast: Secondary | ICD-10-CM

## 2012-11-26 DIAGNOSIS — G609 Hereditary and idiopathic neuropathy, unspecified: Secondary | ICD-10-CM | POA: Diagnosis not present

## 2012-11-26 DIAGNOSIS — Z5111 Encounter for antineoplastic chemotherapy: Secondary | ICD-10-CM

## 2012-11-26 DIAGNOSIS — B370 Candidal stomatitis: Secondary | ICD-10-CM

## 2012-11-26 DIAGNOSIS — C773 Secondary and unspecified malignant neoplasm of axilla and upper limb lymph nodes: Secondary | ICD-10-CM

## 2012-11-26 DIAGNOSIS — R000 Tachycardia, unspecified: Secondary | ICD-10-CM | POA: Diagnosis not present

## 2012-11-26 DIAGNOSIS — Z5112 Encounter for antineoplastic immunotherapy: Secondary | ICD-10-CM | POA: Diagnosis not present

## 2012-11-26 LAB — CBC WITH DIFFERENTIAL/PLATELET
BASO%: 0.1 % (ref 0.0–2.0)
Basophils Absolute: 0 10*3/uL (ref 0.0–0.1)
EOS%: 0.3 % (ref 0.0–7.0)
HCT: 33.3 % — ABNORMAL LOW (ref 34.8–46.6)
HGB: 11.4 g/dL — ABNORMAL LOW (ref 11.6–15.9)
LYMPH%: 5.8 % — ABNORMAL LOW (ref 14.0–49.7)
MCH: 32.8 pg (ref 25.1–34.0)
MCHC: 34.2 g/dL (ref 31.5–36.0)
MCV: 95.7 fL (ref 79.5–101.0)
MONO%: 5.8 % (ref 0.0–14.0)
NEUT%: 88 % — ABNORMAL HIGH (ref 38.4–76.8)

## 2012-11-26 LAB — COMPREHENSIVE METABOLIC PANEL (CC13)
Albumin: 3.4 g/dL — ABNORMAL LOW (ref 3.5–5.0)
Alkaline Phosphatase: 57 U/L (ref 40–150)
BUN: 23 mg/dL (ref 7.0–26.0)
Creatinine: 0.8 mg/dL (ref 0.6–1.1)
Glucose: 329 mg/dl — ABNORMAL HIGH (ref 70–99)
Total Bilirubin: 0.36 mg/dL (ref 0.20–1.20)

## 2012-11-26 MED ORDER — FLUCONAZOLE 200 MG PO TABS: 200.0000 mg | ORAL_TABLET | Freq: Every day | ORAL | Status: DC

## 2012-11-26 MED ORDER — ACETAMINOPHEN 325 MG PO TABS
650.0000 mg | ORAL_TABLET | Freq: Once | ORAL | Status: AC
  Administered 2012-11-26: 650 mg via ORAL

## 2012-11-26 MED ORDER — SODIUM CHLORIDE 0.9 % IV SOLN
800.0000 mg/m2 | Freq: Once | INTRAVENOUS | Status: AC
  Administered 2012-11-26: 1368 mg via INTRAVENOUS
  Filled 2012-11-26: qty 36

## 2012-11-26 MED ORDER — SODIUM CHLORIDE 0.9 % IV SOLN
2.0000 mg/kg | Freq: Once | INTRAVENOUS | Status: AC
  Administered 2012-11-26: 126 mg via INTRAVENOUS
  Filled 2012-11-26: qty 6

## 2012-11-26 MED ORDER — SODIUM CHLORIDE 0.9 % IV SOLN
Freq: Once | INTRAVENOUS | Status: AC
  Administered 2012-11-26: 12:00:00 via INTRAVENOUS

## 2012-11-26 MED ORDER — DIPHENHYDRAMINE HCL 25 MG PO CAPS
50.0000 mg | ORAL_CAPSULE | Freq: Once | ORAL | Status: AC
  Administered 2012-11-26: 50 mg via ORAL

## 2012-11-26 MED ORDER — HEPARIN SOD (PORK) LOCK FLUSH 100 UNIT/ML IV SOLN
500.0000 [IU] | Freq: Once | INTRAVENOUS | Status: AC | PRN
  Administered 2012-11-26: 500 [IU]
  Filled 2012-11-26: qty 5

## 2012-11-26 MED ORDER — SODIUM CHLORIDE 0.9 % IJ SOLN
10.0000 mL | INTRAMUSCULAR | Status: DC | PRN
  Administered 2012-11-26: 10 mL
  Filled 2012-11-26: qty 10

## 2012-11-26 MED ORDER — PROCHLORPERAZINE MALEATE 10 MG PO TABS
10.0000 mg | ORAL_TABLET | Freq: Once | ORAL | Status: AC
  Administered 2012-11-26: 10 mg via ORAL

## 2012-11-26 NOTE — Progress Notes (Signed)
OFFICE PROGRESS NOTE  CC  [name], MD [address] [place] [address]  DIAGNOSIS: 66 year old female with new diagnosis of inflammatory breast cancer  PRIOR THERAPY: #1 patient was originally seen in my clinic for new diagnosis of inflammatory breast cancer she was referred by Dr. Emelia Loron. She had a mammogram performed That showed an abnormality. That was biopsied and it showed an invasive mammary carcinoma with lymphovascular invasion grade 3 ER negative PR negative HER-2/neu positive.  #2 patient has gone on to have MRI of the breasts performed the MRI shows diffuse right breast neoplasm on a large level I right axillary lymph nodes compatible with lymphatic spread.Patient has had a biopsy of the right axillary lymph node that is compatible with invasive mammary carcinoma.  #3 patient  began neoadjuvant FEC 100 with day 2 Neulasta on 07/23/2012  #4 Weekly neoadjuvant Taxol and Herceptin starting 09/17/12, patient developed neuropathies, and taxol was discontinued early and patient received 2 weeks of herceptin only.    #5 Weekly Gemcitabine and Herceptin starting on 11/19/12  CURRENT THERAPY: Herceptin/Gemcitabine cycle 2  INTERVAL HISTORY: Jessica Wells 66 y.o. female returns for chemotherapy today. Her numbness remains about the same this week.  She's slightly fatigued, and experiencing occasional diarrhea and gas.  Otherwise she is doing well and w/o questions/concerns.  She is tolerating her chemotherapy change without difficulty.    MEDICAL HISTORY: Past Medical History  Diagnosis Date  . Asthma   . GERD (gastroesophageal reflux disease)   . Dysrhythmia     PAT-sees dr Laurence Compton meds  . Breast cancer     inflammatory right breast ca  . Allergy   . PAT (paroxysmal atrial tachycardia)     hx  . Arthritis     osteopenia,knees  . PONV (postoperative nausea and vomiting) 09-13-12    severe, with Port-a-cath, was managed without PONV     ALLERGIES:  is allergic to anesthetics, amide; bactrim; sulfa antibiotics; codeine; codeine phosphate; and eggs or egg-derived products.  MEDICATIONS:  Current Outpatient Prescriptions  Medication Sig Dispense Refill  . albuterol (PROVENTIL HFA;VENTOLIN HFA) 108 (90 BASE) MCG/ACT inhaler Inhale 2 puffs into the lungs every 6 (six) hours as needed.      . Alum & Mag Hydroxide-Simeth (MAGIC MOUTHWASH W/LIDOCAINE) SOLN Take 5 mLs by mouth 4 (four) times daily as needed.  60 mL  0  . b complex vitamins tablet Take 1 tablet by mouth daily.      . calcium-vitamin D (OSCAL WITH D) 500-200 MG-UNIT per tablet Take 1 tablet by mouth daily.      . fish oil-omega-3 fatty acids 1000 MG capsule Take 2 g by mouth daily.      . Flaxseed, Linseed, (FLAX SEEDS PO) Take 1 tablet by mouth daily.      Marland Kitchen gabapentin (NEURONTIN) 100 MG capsule Take 300 mg by mouth at bedtime.      Marland Kitchen glucosamine-chondroitin 500-400 MG tablet Take 1 tablet by mouth daily.      Marland Kitchen lidocaine-prilocaine (EMLA) cream Apply topically as needed.  30 g  5  . metoprolol succinate (TOPROL XL) 50 MG 24 hr tablet Take 1 tablet (50 mg total) by mouth daily. Take with or immediately following a meal.  30 tablet  3  . Multiple Vitamin (MULTIVITAMIN WITH MINERALS) TABS Take 1 tablet by mouth daily.      Marland Kitchen omeprazole (PRILOSEC) 20 MG capsule Take 20 mg by mouth daily as needed. For heartburn      .  ondansetron (ZOFRAN) 8 MG tablet       . valACYclovir (VALTREX) 500 MG tablet Take 1 tablet (500 mg total) by mouth 3 (three) times daily.  90 tablet  6  . aspirin EC 81 MG tablet Take 162 mg by mouth every 6 (six) hours as needed. For blood clot symptoms      . ciprofloxacin (CIPRO) 500 MG tablet Take 500 mg by mouth 2 (two) times daily. As needed      . dexamethasone (DECADRON) 4 MG tablet Take 2 tablets (8 mg total) by mouth 2 (two) times daily with a meal. Take two tablets twice a day the day before chemo, then two tablets twice a day for three days  after chemo.  90 tablet  2  . fluconazole (DIFLUCAN) 200 MG tablet Take 1 tablet (200 mg total) by mouth daily.  8 tablet  0  . fluticasone (FLONASE) 50 MCG/ACT nasal spray Place 1 spray into the nose daily as needed. For allergies      . oxyCODONE-acetaminophen (ROXICET) 5-325 MG per tablet Take 1 tablet by mouth every 4 (four) hours as needed for pain.  15 tablet  0   No current facility-administered medications for this visit.   Facility-Administered Medications Ordered in Other Visits  Medication Dose Route Frequency Provider Last Rate Last Dose  . sodium chloride 0.9 % injection 10 mL  10 mL Intracatheter PRN Victorino December, MD   10 mL at 11/26/12 1346    SURGICAL HISTORY:  Past Surgical History  Procedure Date  . Dilation and curettage of uterus     x3  . Tonsillectomy   . Colonoscopy   . Portacath placement 07/21/2012    Procedure: INSERTION PORT-A-CATH;  Surgeon: Emelia Loron, MD;  Location:  SURGERY CENTER;  Service: General;  Laterality: Left;    REVIEW OF SYSTEMS:   General: fatigue (+), night sweats (-), fever (-), pain (-) Lymph: palpable nodes (-) HEENT: vision changes (-), mucositis (-), gum bleeding (-), epistaxis (-) Cardiovascular: chest pain (-), palpitations (-) Pulmonary: shortness of breath (-), dyspnea on exertion (-), cough (-), hemoptysis (-) GI:  Early satiety (-), melena (-), dysphagia (-), nausea/vomiting (-), diarrhea (+) GU: dysuria (-), hematuria (-), incontinence (-) Musculoskeletal: joint swelling (-), joint pain (-), back pain (-) Neuro: weakness (-), numbness (+), headache (-), confusion (-) Skin: Rash (-), lesions (-), dryness (-) Psych: depression (-), suicidal/homicidal ideation (-), feeling of hopelessness (-)   PHYSICAL EXAMINATION:  BP 148/74  Pulse 76  Temp 98.1 F (36.7 C) (Oral)  Resp 20  Ht 5\' 1"  (1.549 m)  Wt 145 lb 6.4 oz (65.953 kg)  BMI 27.47 kg/m2 General: Patient is a well appearing female in no acute  distress HEENT: PERRLA, sclerae anicteric no conjunctival pallor, MMM Neck: supple, no palpable adenopathy Lungs: clear to auscultation bilaterally, no wheezes, rhonchi, or rales Cardiovascular: regular rate rhythm, S1, S2, no murmurs, rubs or gallops Abdomen: Soft, non-tender, non-distended, normoactive bowel sounds, no HSM Extremities: warm and well perfused, no clubbing, cyanosis, or edema Skin: No rashes or lesions Neuro: Non-focal, gait, tandem gait intact Right breast reveals improvement in  Dimpling of the skin nipple is now more prominent and I am not able to feel a mass ECOG PERFORMANCE STATUS: 0 - Asymptomatic   LABORATORY DATA: Lab Results  Component Value Date   WBC 8.0 11/26/2012   HGB 11.4* 11/26/2012   HCT 33.3* 11/26/2012   MCV 95.7 11/26/2012   PLT 91 Large  platelets present* 11/26/2012      Chemistry      Component Value Date/Time   NA 134* 11/26/2012 1032   NA 137 09/15/2012 1330   K 4.1 11/26/2012 1032   K 3.4* 09/15/2012 1330   CL 98 11/26/2012 1032   CL 100 09/15/2012 1330   CO2 25 11/26/2012 1032   CO2 27 09/15/2012 1330   BUN 23.0 11/26/2012 1032   BUN 9 09/15/2012 1330   CREATININE 0.8 11/26/2012 1032   CREATININE 0.66 09/15/2012 1330      Component Value Date/Time   CALCIUM 8.9 11/26/2012 1032   CALCIUM 9.3 09/15/2012 1330   ALKPHOS 57 11/26/2012 1032   ALKPHOS 96 09/10/2012 1401   AST 14 11/26/2012 1032   AST 12 09/10/2012 1401   ALT 39 11/26/2012 1032   ALT 16 09/10/2012 1401   BILITOT 0.36 11/26/2012 1032   BILITOT 0.9 09/10/2012 1401       RADIOGRAPHIC STUDIES:  ASSESSMENT: 66 year old female with  #1 inflammatory right breast cancer with positive lymph nodes the tumor is ER negative PR negative HER-2/neu positive. Patient is  to receive neoadjuvant chemotherapy intially Consisting of FEC q 2 weeks x 4 cycles. Then she will receive taxol and herceptin q week x 12 weeks.Once patient completes her chemotherapy then we will get  restaging MRIs performed to evaluate response to therapy. Patient does understand that she will most likely need a mastectomy because she does have inflammatory breast cancer.  #2 Tachycardia  #3 Peripheral neuropathy  PLAN:   #1 Ms. Mazor continues to do well. Due to her numbness we will stop giving her the Taxol.  She has started Gemzar/Herceptin and is doing well with this.  She will increase her Neurontin to 200mg  in the morning and 300 mg in the evening.    #2 Ms. Poteats tachycardia is improved.  She was started on Toprol XL and is tolerating it well.    #3 I will see her back next week for her next cycle of Gemzar Herceptin.    All questions were answered. The patient knows to call the clinic with any problems, questions or concerns. We can certainly see the patient much sooner if necessary.  I spent 25 minutes counseling the patient face to face. The total time spent in the appointment was 30 minutes.  This case was reviewed with Dr. Welton Flakes.  Cherie Ouch Lyn Hollingshead, NP Medical Oncology [facility] Phone: [phone] 11/26/2012, 2:47 PM

## 2012-11-26 NOTE — Telephone Encounter (Signed)
Patient request that she does need and open machine

## 2012-11-26 NOTE — Patient Instructions (Addendum)
Doing well. Proceed with chemo.  Take Fluconazole daily x 2 days.  Take two tablets of the neurontin in the morning, and three in the evening.   Please call us if you have any questions or concerns.

## 2012-11-26 NOTE — Patient Instructions (Addendum)
[facility] Discharge Instructions for Patients Receiving Chemotherapy  Today you received the following chemotherapy agents Gemzar and Herceptin.  To help prevent nausea and vomiting after your treatment, we encourage you to take your nausea medication.   If you develop nausea and vomiting that is not controlled by your nausea medication, call the clinic. If it is after clinic hours your family physician or the after hours number for the clinic or go to the Emergency Department.   BELOW ARE SYMPTOMS THAT SHOULD BE REPORTED IMMEDIATELY:  *FEVER GREATER THAN 100.5 F  *CHILLS WITH OR WITHOUT FEVER  NAUSEA AND VOMITING THAT IS NOT CONTROLLED WITH YOUR NAUSEA MEDICATION  *UNUSUAL SHORTNESS OF BREATH  *UNUSUAL BRUISING OR BLEEDING  TENDERNESS IN MOUTH AND THROAT WITH OR WITHOUT PRESENCE OF ULCERS  *URINARY PROBLEMS  *BOWEL PROBLEMS  UNUSUAL RASH Items with * indicate a potential emergency and should be followed up as soon as possible.  One of the nurses will contact you 24 hours after your treatment. Please let the nurse know about any problems that you may have experienced. Feel free to call the clinic you have any questions or concerns. The clinic phone number is [phone].   I have been informed and understand all the instructions given to me. I know to contact the clinic, my physician, or go to the Emergency Department if any problems should occur. I do not have any questions at this time, but understand that I may call the clinic during office hours   should I have any questions or need assistance in obtaining follow up care.    __________________________________________  _____________  __________ Signature of Patient or Authorized Representative            Date                   Time    __________________________________________ Nurse's Signature    

## 2012-12-01 DIAGNOSIS — I82409 Acute embolism and thrombosis of unspecified deep veins of unspecified lower extremity: Secondary | ICD-10-CM

## 2012-12-01 HISTORY — DX: Acute embolism and thrombosis of unspecified deep veins of unspecified lower extremity: I82.409

## 2012-12-01 HISTORY — PX: INSERTION OF VENA CAVA FILTER: SHX5871

## 2012-12-03 ENCOUNTER — Encounter: Payer: Self-pay | Admitting: Adult Health

## 2012-12-03 ENCOUNTER — Other Ambulatory Visit: Payer: Self-pay | Admitting: Medical Oncology

## 2012-12-03 ENCOUNTER — Ambulatory Visit (HOSPITAL_COMMUNITY)
Admission: RE | Admit: 2012-12-03 | Discharge: 2012-12-03 | Disposition: A | Discharge status: Home / Self Care | Payer: Medicare Other | Source: Ambulatory Visit | Attending: Adult Health | Admitting: Adult Health

## 2012-12-03 ENCOUNTER — Other Ambulatory Visit (HOSPITAL_BASED_OUTPATIENT_CLINIC_OR_DEPARTMENT_OTHER): Payer: Medicare Other | Admitting: Lab

## 2012-12-03 ENCOUNTER — Ambulatory Visit (HOSPITAL_BASED_OUTPATIENT_CLINIC_OR_DEPARTMENT_OTHER): Payer: Medicare Other | Admitting: Adult Health

## 2012-12-03 ENCOUNTER — Ambulatory Visit (HOSPITAL_BASED_OUTPATIENT_CLINIC_OR_DEPARTMENT_OTHER): Payer: Medicare Other

## 2012-12-03 VITALS — BP 127/72 | HR 83 | Temp 98.1°F | Resp 20 | Ht 61.0 in | Wt 145.3 lb

## 2012-12-03 DIAGNOSIS — C773 Secondary and unspecified malignant neoplasm of axilla and upper limb lymph nodes: Secondary | ICD-10-CM | POA: Diagnosis not present

## 2012-12-03 DIAGNOSIS — Z5111 Encounter for antineoplastic chemotherapy: Secondary | ICD-10-CM

## 2012-12-03 DIAGNOSIS — C50919 Malignant neoplasm of unspecified site of unspecified female breast: Secondary | ICD-10-CM

## 2012-12-03 DIAGNOSIS — Z452 Encounter for adjustment and management of vascular access device: Secondary | ICD-10-CM | POA: Diagnosis not present

## 2012-12-03 DIAGNOSIS — Z5112 Encounter for antineoplastic immunotherapy: Secondary | ICD-10-CM

## 2012-12-03 DIAGNOSIS — G609 Hereditary and idiopathic neuropathy, unspecified: Secondary | ICD-10-CM | POA: Diagnosis not present

## 2012-12-03 DIAGNOSIS — Z171 Estrogen receptor negative status [ER-]: Secondary | ICD-10-CM | POA: Diagnosis not present

## 2012-12-03 DIAGNOSIS — C50119 Malignant neoplasm of central portion of unspecified female breast: Secondary | ICD-10-CM

## 2012-12-03 LAB — CBC WITH DIFFERENTIAL/PLATELET
Basophils Absolute: 0 10*3/uL (ref 0.0–0.1)
Eosinophils Absolute: 0 10*3/uL (ref 0.0–0.5)
HCT: 32.7 % — ABNORMAL LOW (ref 34.8–46.6)
HGB: 11.2 g/dL — ABNORMAL LOW (ref 11.6–15.9)
MCV: 95.3 fL (ref 79.5–101.0)
MONO%: 6.8 % (ref 0.0–14.0)
NEUT#: 2.9 10*3/uL (ref 1.5–6.5)
NEUT%: 68.9 % (ref 38.4–76.8)
RDW: 14.2 % (ref 11.2–14.5)

## 2012-12-03 LAB — COMPREHENSIVE METABOLIC PANEL (CC13)
Albumin: 3.2 g/dL — ABNORMAL LOW (ref 3.5–5.0)
Alkaline Phosphatase: 52 U/L (ref 40–150)
BUN: 28 mg/dL — ABNORMAL HIGH (ref 7.0–26.0)
Calcium: 9 mg/dL (ref 8.4–10.4)
Chloride: 103 mEq/L (ref 98–107)
Creatinine: 0.8 mg/dL (ref 0.6–1.1)
Glucose: 258 mg/dl — ABNORMAL HIGH (ref 70–99)
Potassium: 3.7 mEq/L (ref 3.5–5.1)

## 2012-12-03 MED ORDER — PROCHLORPERAZINE MALEATE 10 MG PO TABS
10.0000 mg | ORAL_TABLET | Freq: Once | ORAL | Status: AC
  Administered 2012-12-03: 10 mg via ORAL

## 2012-12-03 MED ORDER — IOHEXOL 300 MG/ML  SOLN
12.0000 mL | Freq: Once | INTRAMUSCULAR | Status: AC | PRN
  Administered 2012-12-03: 12 mL via INTRAVENOUS

## 2012-12-03 MED ORDER — SODIUM CHLORIDE 0.9 % IJ SOLN
10.0000 mL | INTRAMUSCULAR | Status: DC | PRN
  Filled 2012-12-03: qty 10

## 2012-12-03 MED ORDER — SODIUM CHLORIDE 0.9 % IV SOLN
2.0000 mg/kg | Freq: Once | INTRAVENOUS | Status: AC
  Administered 2012-12-03: 126 mg via INTRAVENOUS
  Filled 2012-12-03: qty 6

## 2012-12-03 MED ORDER — SODIUM CHLORIDE 0.9 % IV SOLN
800.0000 mg/m2 | Freq: Once | INTRAVENOUS | Status: AC
  Administered 2012-12-03: 1368 mg via INTRAVENOUS
  Filled 2012-12-03: qty 36

## 2012-12-03 MED ORDER — ALTEPLASE 2 MG IJ SOLR
2.0000 mg | Freq: Once | INTRAMUSCULAR | Status: AC | PRN
  Administered 2012-12-03: 2 mg
  Filled 2012-12-03: qty 2

## 2012-12-03 MED ORDER — HEPARIN SOD (PORK) LOCK FLUSH 100 UNIT/ML IV SOLN
500.0000 [IU] | Freq: Once | INTRAVENOUS | Status: AC | PRN
  Filled 2012-12-03: qty 5

## 2012-12-03 MED ORDER — SODIUM CHLORIDE 0.9 % IV SOLN: Freq: Once | INTRAVENOUS | Status: DC

## 2012-12-03 MED ORDER — DIPHENHYDRAMINE HCL 25 MG PO CAPS
50.0000 mg | ORAL_CAPSULE | Freq: Once | ORAL | Status: AC
  Administered 2012-12-03: 50 mg via ORAL

## 2012-12-03 MED ORDER — ACETAMINOPHEN 325 MG PO TABS
650.0000 mg | ORAL_TABLET | Freq: Once | ORAL | Status: AC
  Administered 2012-12-03: 650 mg via ORAL

## 2012-12-03 NOTE — Patient Instructions (Addendum)
Doing well.  Proceed with chemotherapy.  We will see you back next week.

## 2012-12-03 NOTE — Progress Notes (Unsigned)
1215- Port accessed without diffuclty per [employer].  PAC flushed without difficulty. Scant amount of blood return noted. Pt repositioned to attempt lab draw via PAC and pt complained of a sharp pain at [facility] site.  Pt sent to IR for assessment of PAC.  Per Olegario Messier in IR, Dr. Fredia Sorrow reports that port has fibrin sheath and is OK for cath-flo if approval received from MD.  Pt requests that peripheral IV be started for Gemzar and Herceptin while cath-flo is in place.  OK for peripheral line and cath-flo per L. Alexander NP. 1415-Platelet count-61 today.  Dr. Welton Flakes and L. Alexander NP aware and order received to give Gemzar and Herceptin as ordered.

## 2012-12-03 NOTE — Patient Instructions (Signed)
[facility] Discharge Instructions for Patients Receiving Chemotherapy  Today you received the following chemotherapy agents gemcitabine and herceptin  To help prevent nausea and vomiting after your treatment, we encourage you to take your nausea medication. Begin taking it tonight and take it as often as prescribed.   If you develop nausea and vomiting that is not controlled by your nausea medication, call the clinic. If it is after clinic hours your family physician or the after hours number for the clinic or go to the Emergency Department.   BELOW ARE SYMPTOMS THAT SHOULD BE REPORTED IMMEDIATELY:  *FEVER GREATER THAN 100.5 F  *CHILLS WITH OR WITHOUT FEVER  NAUSEA AND VOMITING THAT IS NOT CONTROLLED WITH YOUR NAUSEA MEDICATION  *UNUSUAL SHORTNESS OF BREATH  *UNUSUAL BRUISING OR BLEEDING  TENDERNESS IN MOUTH AND THROAT WITH OR WITHOUT PRESENCE OF ULCERS  *URINARY PROBLEMS  *BOWEL PROBLEMS  UNUSUAL RASH Items with * indicate a potential emergency and should be followed up as soon as possible.  One of the nurses will contact you 24 hours after your treatment. Please let the nurse know about any problems that you may have experienced. Feel free to call the clinic you have any questions or concerns. The clinic phone number is [phone].   I have been informed and understand all the instructions given to me. I know to contact the clinic, my physician, or go to the Emergency Department if any problems should occur. I do not have any questions at this time, but understand that I may call the clinic during office hours   should I have any questions or need assistance in obtaining follow up care.    __________________________________________  _____________  __________ Signature of Patient or Authorized Representative            Date                   Time    __________________________________________ Nurse's Signature

## 2012-12-03 NOTE — Progress Notes (Signed)
OFFICE PROGRESS NOTE  CC  Jessica Alken, MD [address] [place] [address]  DIAGNOSIS: 67 year old female with new diagnosis of inflammatory breast cancer  PRIOR THERAPY: #1 patient was originally seen in my clinic for new diagnosis of inflammatory breast cancer she was referred by Dr. Emelia Loron. She had a mammogram performed That showed an abnormality. That was biopsied and it showed an invasive mammary carcinoma with lymphovascular invasion grade 3 ER negative PR negative HER-2/neu positive.  #2 patient has gone on to have MRI of the breasts performed the MRI shows diffuse right breast neoplasm on a large level I right axillary lymph nodes compatible with lymphatic spread.Patient has had a biopsy of the right axillary lymph node that is compatible with invasive mammary carcinoma.  #3 patient  began neoadjuvant FEC 100 with day 2 Neulasta on 07/23/2012  #4 Weekly neoadjuvant Taxol and Herceptin starting 09/17/12, patient developed neuropathies, and taxol was discontinued early and patient received 2 weeks of herceptin only.    #5 Weekly Gemcitabine and Herceptin starting on 11/19/12  CURRENT THERAPY: Herceptin/Gemcitabine cycle 3  INTERVAL HISTORY: Jessica Wells 67 y.o. female returns for chemotherapy today. She continues to experience fatigue, and has daily loose bowel movements accompanied by gas.  She isn't having any abd pain or cramping.  She is continuing to take her Neurontin and her neuropathies are unchanged.  She denies fevers, chills, nausea, vomiting or any other concerns.    MEDICAL HISTORY: Past Medical History  Diagnosis Date  . Asthma   . GERD (gastroesophageal reflux disease)   . Dysrhythmia     PAT-sees dr Laurence Compton meds  . Breast cancer     inflammatory right breast ca  . Allergy   . PAT (paroxysmal atrial tachycardia)     hx  . Arthritis     osteopenia,knees  . PONV (postoperative nausea and vomiting) 09-13-12    severe, with  Port-a-cath, was managed without PONV    ALLERGIES:  is allergic to anesthetics, amide; bactrim; sulfa antibiotics; codeine; codeine phosphate; and eggs or egg-derived products.  MEDICATIONS:  Current Outpatient Prescriptions  Medication Sig Dispense Refill  . albuterol (PROVENTIL HFA;VENTOLIN HFA) 108 (90 BASE) MCG/ACT inhaler Inhale 2 puffs into the lungs every 6 (six) hours as needed.      . Alum & Mag Hydroxide-Simeth (MAGIC MOUTHWASH W/LIDOCAINE) SOLN Take 5 mLs by mouth 4 (four) times daily as needed.  60 mL  0  . aspirin EC 81 MG tablet Take 162 mg by mouth every 6 (six) hours as needed. For blood clot symptoms      . b complex vitamins tablet Take 1 tablet by mouth daily.      . calcium-vitamin D (OSCAL WITH D) 500-200 MG-UNIT per tablet Take 1 tablet by mouth daily.      . ciprofloxacin (CIPRO) 500 MG tablet Take 500 mg by mouth 2 (two) times daily. As needed      . dexamethasone (DECADRON) 4 MG tablet Take 2 tablets (8 mg total) by mouth 2 (two) times daily with a meal. Take two tablets twice a day the day before chemo, then two tablets twice a day for three days after chemo.  90 tablet  2  . fish oil-omega-3 fatty acids 1000 MG capsule Take 2 g by mouth daily.      . Flaxseed, Linseed, (FLAX SEEDS PO) Take 1 tablet by mouth daily.      . fluconazole (DIFLUCAN) 200 MG tablet Take 1 tablet (200 mg  total) by mouth daily.  8 tablet  0  . fluticasone (FLONASE) 50 MCG/ACT nasal spray Place 1 spray into the nose daily as needed. For allergies      . gabapentin (NEURONTIN) 100 MG capsule Take 300 mg by mouth at bedtime.      Marland Kitchen glucosamine-chondroitin 500-400 MG tablet Take 1 tablet by mouth daily.      Marland Kitchen lidocaine-prilocaine (EMLA) cream Apply topically as needed.  30 g  5  . metoprolol succinate (TOPROL XL) 50 MG 24 hr tablet Take 1 tablet (50 mg total) by mouth daily. Take with or immediately following a meal.  30 tablet  3  . Multiple Vitamin (MULTIVITAMIN WITH MINERALS) TABS Take 1  tablet by mouth daily.      Marland Kitchen omeprazole (PRILOSEC) 20 MG capsule Take 20 mg by mouth daily as needed. For heartburn      . ondansetron (ZOFRAN) 8 MG tablet       . oxyCODONE-acetaminophen (ROXICET) 5-325 MG per tablet Take 1 tablet by mouth every 4 (four) hours as needed for pain.  15 tablet  0  . valACYclovir (VALTREX) 500 MG tablet Take 1 tablet (500 mg total) by mouth 3 (three) times daily.  90 tablet  6   No current facility-administered medications for this visit.   Facility-Administered Medications Ordered in Other Visits  Medication Dose Route Frequency Provider Last Rate Last Dose  . 0.9 %  sodium chloride infusion   Intravenous Once Victorino December, MD      . Gemcitabine HCl (GEMZAR) 1,368 mg in sodium chloride 0.9 % 100 mL chemo infusion  800 mg/m2 (Treatment Plan Actual) Intravenous Once Victorino December, MD      . heparin lock flush 100 unit/mL  500 Units Intracatheter Once PRN Victorino December, MD      . prochlorperazine (COMPAZINE) tablet 10 mg  10 mg Oral Once Victorino December, MD      . sodium chloride 0.9 % injection 10 mL  10 mL Intracatheter PRN Victorino December, MD        SURGICAL HISTORY:  Past Surgical History  Procedure Date  . Dilation and curettage of uterus     x3  . Tonsillectomy   . Colonoscopy   . Portacath placement 07/21/2012    Procedure: INSERTION PORT-A-CATH;  Surgeon: Emelia Loron, MD;  Location: [facility];  Service: General;  Laterality: Left;    REVIEW OF SYSTEMS:   General: fatigue (+), night sweats (-), fever (-), pain (-) Lymph: palpable nodes (-) HEENT: vision changes (-), mucositis (-), gum bleeding (-), epistaxis (-) Cardiovascular: chest pain (-), palpitations (-) Pulmonary: shortness of breath (-), dyspnea on exertion (-), cough (-), hemoptysis (-) GI:  Early satiety (-), melena (-), dysphagia (-), nausea/vomiting (-), diarrhea (+) GU: dysuria (-), hematuria (-), incontinence (-) Musculoskeletal: joint swelling (-), joint  pain (-), back pain (-) Neuro: weakness (-), numbness (+), headache (-), confusion (-) Skin: Rash (-), lesions (-), dryness (-) Psych: depression (-), suicidal/homicidal ideation (-), feeling of hopelessness (-)   PHYSICAL EXAMINATION:  BP 127/72  Pulse 83  Temp 98.1 F (36.7 C)  Resp 20  Ht 5\' 1"  (1.549 m)  Wt 145 lb 4.8 oz (65.908 kg)  BMI 27.45 kg/m2 General: Patient is a well appearing female in no acute distress HEENT: PERRLA, sclerae anicteric no conjunctival pallor, MMM Neck: supple, no palpable adenopathy Lungs: clear to auscultation bilaterally, no wheezes, rhonchi, or rales Cardiovascular: regular rate rhythm,  S1, S2, no murmurs, rubs or gallops Abdomen: Soft, non-tender, non-distended, normoactive bowel sounds, no HSM Extremities: warm and well perfused, no clubbing, cyanosis, or edema Skin: No rashes or lesions Neuro: Non-focal, gait, tandem gait intact Right breast reveals improvement in  Dimpling of the skin nipple is now more prominent and I am not able to feel a mass ECOG PERFORMANCE STATUS: 0 - Asymptomatic   LABORATORY DATA: Lab Results  Component Value Date   WBC 4.3 12/03/2012   HGB 11.2* 12/03/2012   HCT 32.7* 12/03/2012   MCV 95.3 12/03/2012   PLT 61* 12/03/2012      Chemistry      Component Value Date/Time   NA 134* 11/26/2012 1032   NA 137 09/15/2012 1330   K 4.1 11/26/2012 1032   K 3.4* 09/15/2012 1330   CL 98 11/26/2012 1032   CL 100 09/15/2012 1330   CO2 25 11/26/2012 1032   CO2 27 09/15/2012 1330   BUN 23.0 11/26/2012 1032   BUN 9 09/15/2012 1330   CREATININE 0.8 11/26/2012 1032   CREATININE 0.66 09/15/2012 1330      Component Value Date/Time   CALCIUM 8.9 11/26/2012 1032   CALCIUM 9.3 09/15/2012 1330   ALKPHOS 57 11/26/2012 1032   ALKPHOS 96 09/10/2012 1401   AST 14 11/26/2012 1032   AST 12 09/10/2012 1401   ALT 39 11/26/2012 1032   ALT 16 09/10/2012 1401   BILITOT 0.36 11/26/2012 1032   BILITOT 0.9 09/10/2012 1401        RADIOGRAPHIC STUDIES:  ASSESSMENT: 67 year old female with  #1 inflammatory right breast cancer with positive lymph nodes the tumor is ER negative PR negative HER-2/neu positive. Patient is  to receive neoadjuvant chemotherapy intially Consisting of FEC q 2 weeks x 4 cycles. Then she will receive taxol and herceptin q week x 12 weeks.Once patient completes her chemotherapy then we will get restaging MRIs performed to evaluate response to therapy. Patient does understand that she will most likely need a mastectomy because she does have inflammatory breast cancer.  #2 Tachycardia  #3 Peripheral neuropathy  PLAN:   #1 Ms. Heinicke continues to do well. Due to her numbness we stopped giving her the Taxol.  She has started Gemzar/Herceptin and is doing well with this.  Her platelets have dropped to 61.  We will go ahead and treat her this week after discussion with Dr. Welton Flakes.  She has an MRI of the breasts scheduled on 12/08/12. She will increase her Neurontin to 200mg  in the morning and 300 mg in the evening.    #2 Ms. Poteats tachycardia is improved.  She was started on Toprol XL and is tolerating it well.    #3 I will see her back next week for her next cycle of Gemzar Herceptin.    #4 While patient was receiving treatment, per her nurse she accessed her port, and originally got blood return and then began to have trouble.  They tried changing her position, and when she leaned forward Ms. Beyl felt a pop and experienced pain.  I was able to call interventional radiology, we will do a dye study today, and she will receive her chemotherapy afterward.    All questions were answered. The patient knows to call the clinic with any problems, questions or concerns. We can certainly see the patient much sooner if necessary.  I spent 25 minutes counseling the patient face to face. The total time spent in the appointment was 30 minutes.  This  case was reviewed with Dr. Welton Flakes.  Cherie Ouch Lyn Hollingshead,  NP Medical Oncology [facility] Phone: [phone] 12/03/2012, 12:40 PM

## 2012-12-05 NOTE — Progress Notes (Signed)
OFFICE PROGRESS NOTE  CC  Jessica Alken, MD [address] [place] [address]  DIAGNOSIS: 67 year old female with new diagnosis of inflammatory breast cancer  PRIOR THERAPY: #1 patient was originally seen in my clinic for new diagnosis of inflammatory breast cancer she was referred by Dr. Emelia Loron. She had a mammogram performed That showed an abnormality. That was biopsied and it showed an invasive mammary carcinoma with lymphovascular invasion grade 3 ER negative PR negative HER-2/neu positive.  #2 patient has gone on to have MRI of the breasts performed the MRI shows diffuse right breast neoplasm on a large level I right axillary lymph nodes compatible with lymphatic spread.Patient has had a biopsy of the right axillary lymph node that is compatible with invasive mammary carcinoma.  #3 patient  began neoadjuvant FEC 100 with day 2 Neulasta on 07/23/2012  #4 Weekly neoadjuvant Taxol and Herceptin starting 09/17/12, patient developed neuropathies, and taxol was discontinued early and patient received 2 weeks of herceptin only.    #5 Weekly Gemcitabine and Herceptin starting on 11/19/12  CURRENT THERAPY: Herceptin and Gemzar neoadjuvant  INTERVAL HISTORY: Jessica Wells 67 y.o. female returns for chemotherapy today.patient developed significant neuropathy on Taxol. Therefore we discontinued it and she will now begin Gemzar and Herceptin combination to complete out her to chemotherapy neoadjuvant. She is on Neurontin for the neuropathy. Otherwise she denies any fevers chills night sweats headaches shortness of breath chest pains palpitations she does have fatigue. Remainder of the 10 point review of systems is negative.  MEDICAL HISTORY: Past Medical History  Diagnosis Date  . Asthma   . GERD (gastroesophageal reflux disease)   . Dysrhythmia     PAT-sees dr Laurence Compton meds  . Breast cancer     inflammatory right breast ca  . Allergy   . PAT (paroxysmal  atrial tachycardia)     hx  . Arthritis     osteopenia,knees  . PONV (postoperative nausea and vomiting) 09-13-12    severe, with Port-a-cath, was managed without PONV    ALLERGIES:  is allergic to anesthetics, amide; bactrim; sulfa antibiotics; codeine; codeine phosphate; and eggs or egg-derived products.  MEDICATIONS:  Current Outpatient Prescriptions  Medication Sig Dispense Refill  . albuterol (PROVENTIL HFA;VENTOLIN HFA) 108 (90 BASE) MCG/ACT inhaler Inhale 2 puffs into the lungs every 6 (six) hours as needed.      . Alum & Mag Hydroxide-Simeth (MAGIC MOUTHWASH W/LIDOCAINE) SOLN Take 5 mLs by mouth 4 (four) times daily as needed.  60 mL  0  . aspirin EC 81 MG tablet Take 162 mg by mouth every 6 (six) hours as needed. For blood clot symptoms      . b complex vitamins tablet Take 1 tablet by mouth daily.      . calcium-vitamin D (OSCAL WITH D) 500-200 MG-UNIT per tablet Take 1 tablet by mouth daily.      . ciprofloxacin (CIPRO) 500 MG tablet Take 500 mg by mouth 2 (two) times daily. As needed      . dexamethasone (DECADRON) 4 MG tablet Take 2 tablets (8 mg total) by mouth 2 (two) times daily with a meal. Take two tablets twice a day the day before chemo, then two tablets twice a day for three days after chemo.  90 tablet  2  . fish oil-omega-3 fatty acids 1000 MG capsule Take 2 g by mouth daily.      . Flaxseed, Linseed, (FLAX SEEDS PO) Take 1 tablet by mouth daily.      Marland Kitchen  fluticasone (FLONASE) 50 MCG/ACT nasal spray Place 1 spray into the nose daily as needed. For allergies      . gabapentin (NEURONTIN) 100 MG capsule Take 300 mg by mouth at bedtime.      Marland Kitchen glucosamine-chondroitin 500-400 MG tablet Take 1 tablet by mouth daily.      Marland Kitchen lidocaine-prilocaine (EMLA) cream Apply topically as needed.  30 g  5  . metoprolol succinate (TOPROL XL) 50 MG 24 hr tablet Take 1 tablet (50 mg total) by mouth daily. Take with or immediately following a meal.  30 tablet  3  . Multiple Vitamin  (MULTIVITAMIN WITH MINERALS) TABS Take 1 tablet by mouth daily.      Marland Kitchen omeprazole (PRILOSEC) 20 MG capsule Take 20 mg by mouth daily as needed. For heartburn      . oxyCODONE-acetaminophen (ROXICET) 5-325 MG per tablet Take 1 tablet by mouth every 4 (four) hours as needed for pain.  15 tablet  0  . valACYclovir (VALTREX) 500 MG tablet Take 1 tablet (500 mg total) by mouth 3 (three) times daily.  90 tablet  6  . fluconazole (DIFLUCAN) 200 MG tablet Take 1 tablet (200 mg total) by mouth daily.  8 tablet  0  . ondansetron (ZOFRAN) 8 MG tablet        No current facility-administered medications for this visit.   Facility-Administered Medications Ordered in Other Visits  Medication Dose Route Frequency Provider Last Rate Last Dose  . 0.9 %  sodium chloride infusion   Intravenous Once Victorino December, MD      . sodium chloride 0.9 % injection 10 mL  10 mL Intracatheter PRN Victorino December, MD        SURGICAL HISTORY:  Past Surgical History  Procedure Date  . Dilation and curettage of uterus     x3  . Tonsillectomy   . Colonoscopy   . Portacath placement 07/21/2012    Procedure: INSERTION PORT-A-CATH;  Surgeon: Emelia Loron, MD;  Location: [facility];  Service: General;  Laterality: Left;    REVIEW OF SYSTEMS:   General: fatigue (+), night sweats (-), fever (-), pain (+) Lymph: palpable nodes (-) HEENT: vision changes (-), mucositis (-), gum bleeding (-), epistaxis (-) Cardiovascular: chest pain (-), palpitations (-) Pulmonary: shortness of breath (-), dyspnea on exertion (-), cough (-), hemoptysis (-) GI:  Early satiety (-), melena (-), dysphagia (-), nausea/vomiting (-), diarrhea (-) GU: dysuria (-), hematuria (-), incontinence (-) Musculoskeletal: joint swelling (-), joint pain (-), back pain (-) Neuro: weakness (-), numbness (+), headache (-), confusion (-) Skin: Rash (-), lesions (-), dryness (-) Psych: depression (-), suicidal/homicidal ideation (-), feeling of  hopelessness (-)   PHYSICAL EXAMINATION:  BP 134/73  Pulse 80  Temp 98.2 F (36.8 C) (Oral)  Resp 20  Ht 5\' 1"  (1.549 m)  Wt 145 lb 11.2 oz (66.089 kg)  BMI 27.53 kg/m2 General: Patient is a well appearing female in no acute distress HEENT: PERRLA, sclerae anicteric no conjunctival pallor, MMM Neck: supple, no palpable adenopathy Lungs: clear to auscultation bilaterally, no wheezes, rhonchi, or rales Cardiovascular: regular rate rhythm, S1, S2, no murmurs, rubs or gallops Abdomen: Soft, non-tender, non-distended, normoactive bowel sounds, no HSM Extremities: warm and well perfused, no clubbing, cyanosis, or edema Skin: No rashes or lesions Neuro: Non-focal, gait, tandem gait intact Right breast reveals improvement in  Dimpling of the skin nipple is now more prominent and I am not able to feel a mass ECOG  PERFORMANCE STATUS: 0 - Asymptomatic   LABORATORY DATA: Lab Results  Component Value Date   WBC 4.3 12/03/2012   HGB 11.2* 12/03/2012   HCT 32.7* 12/03/2012   MCV 95.3 12/03/2012   PLT 61* 12/03/2012      Chemistry      Component Value Date/Time   NA 138 12/03/2012 1046   NA 137 09/15/2012 1330   K 3.7 12/03/2012 1046   K 3.4* 09/15/2012 1330   CL 103 12/03/2012 1046   CL 100 09/15/2012 1330   CO2 25 12/03/2012 1046   CO2 27 09/15/2012 1330   BUN 28.0* 12/03/2012 1046   BUN 9 09/15/2012 1330   CREATININE 0.8 12/03/2012 1046   CREATININE 0.66 09/15/2012 1330      Component Value Date/Time   CALCIUM 9.0 12/03/2012 1046   CALCIUM 9.3 09/15/2012 1330   ALKPHOS 52 12/03/2012 1046   ALKPHOS 96 09/10/2012 1401   AST 16 12/03/2012 1046   AST 12 09/10/2012 1401   ALT 39 12/03/2012 1046   ALT 16 09/10/2012 1401   BILITOT 0.35 12/03/2012 1046   BILITOT 0.9 09/10/2012 1401       RADIOGRAPHIC STUDIES:  ASSESSMENT: 67 year old female with  #1 inflammatory right breast cancer with positive lymph nodes the tumor is ER negative PR negative HER-2/neu positive. Patient is  to receive neoadjuvant  chemotherapy intially Consisting of FEC q 2 weeks x 4 cycles. Then she will receive taxol and herceptin q week x 12 weeks.Once patient completes her chemotherapy then we will get restaging MRIs performed to evaluate response to therapy. Patient does understand that she will most likely need a mastectomy because she does have inflammatory breast cancer.  #2 Tachycardia  #3 Peripheral neuropathy  PLAN:   #1 Ms. Leahy continues to do well. Due to her numbness we  stopped giving her the Taxol and we started her  weekly Gemzar and Herceptin  I have increased her Neurontin to 300  Mg qhs.   #2 Ms. Poteats tachycardia is improved.  She was started on Toprol XL and is tolerating it well.    #3 she will proceed with  Gemzar Herceptin.    All questions were answered. The patient knows to call the clinic with any problems, questions or concerns. We can certainly see the patient much sooner if necessary.  I spent 25 minutes counseling the patient face to face. The total time spent in the appointment was 30 minutes.  Drue Second, MD Medical/Oncology [facility] [phone] (beeper) [phone] (Office)

## 2012-12-06 ENCOUNTER — Other Ambulatory Visit: Payer: Self-pay | Admitting: Certified Registered Nurse Anesthetist

## 2012-12-08 ENCOUNTER — Ambulatory Visit
Admission: RE | Admit: 2012-12-08 | Discharge: 2012-12-08 | Disposition: A | Discharge status: Home / Self Care | Payer: BLUE CROSS/BLUE SHIELD | Source: Ambulatory Visit | Attending: Oncology | Admitting: Oncology

## 2012-12-08 DIAGNOSIS — C50919 Malignant neoplasm of unspecified site of unspecified female breast: Secondary | ICD-10-CM | POA: Diagnosis not present

## 2012-12-08 MED ORDER — GADOBENATE DIMEGLUMINE 529 MG/ML IV SOLN
20.0000 mL | Freq: Once | INTRAVENOUS | Status: AC | PRN
  Administered 2012-12-08: 20 mL via INTRAVENOUS

## 2012-12-10 ENCOUNTER — Other Ambulatory Visit: Payer: Self-pay | Admitting: Oncology

## 2012-12-10 ENCOUNTER — Telehealth: Payer: Self-pay | Admitting: Oncology

## 2012-12-10 ENCOUNTER — Encounter: Payer: Self-pay | Admitting: Adult Health

## 2012-12-10 ENCOUNTER — Other Ambulatory Visit (HOSPITAL_BASED_OUTPATIENT_CLINIC_OR_DEPARTMENT_OTHER): Payer: Medicare Other | Admitting: Lab

## 2012-12-10 ENCOUNTER — Ambulatory Visit (HOSPITAL_BASED_OUTPATIENT_CLINIC_OR_DEPARTMENT_OTHER): Payer: Medicare Other | Admitting: Adult Health

## 2012-12-10 ENCOUNTER — Ambulatory Visit (HOSPITAL_BASED_OUTPATIENT_CLINIC_OR_DEPARTMENT_OTHER): Payer: Medicare Other

## 2012-12-10 VITALS — BP 154/77 | HR 101 | Temp 98.3°F | Resp 20 | Ht 61.0 in | Wt 146.4 lb

## 2012-12-10 DIAGNOSIS — R000 Tachycardia, unspecified: Secondary | ICD-10-CM | POA: Diagnosis not present

## 2012-12-10 DIAGNOSIS — G609 Hereditary and idiopathic neuropathy, unspecified: Secondary | ICD-10-CM

## 2012-12-10 DIAGNOSIS — Z5112 Encounter for antineoplastic immunotherapy: Secondary | ICD-10-CM

## 2012-12-10 DIAGNOSIS — C773 Secondary and unspecified malignant neoplasm of axilla and upper limb lymph nodes: Secondary | ICD-10-CM

## 2012-12-10 DIAGNOSIS — Z5111 Encounter for antineoplastic chemotherapy: Secondary | ICD-10-CM | POA: Diagnosis not present

## 2012-12-10 DIAGNOSIS — C50119 Malignant neoplasm of central portion of unspecified female breast: Secondary | ICD-10-CM

## 2012-12-10 DIAGNOSIS — C50919 Malignant neoplasm of unspecified site of unspecified female breast: Secondary | ICD-10-CM

## 2012-12-10 LAB — COMPREHENSIVE METABOLIC PANEL (CC13)
Alkaline Phosphatase: 62 U/L (ref 40–150)
CO2: 26 mEq/L (ref 22–29)
Calcium: 9 mg/dL (ref 8.4–10.4)
Creatinine: 0.7 mg/dL (ref 0.6–1.1)
Glucose: 232 mg/dl — ABNORMAL HIGH (ref 70–99)
Total Bilirubin: 0.42 mg/dL (ref 0.20–1.20)

## 2012-12-10 LAB — CBC WITH DIFFERENTIAL/PLATELET
Basophils Absolute: 0 10*3/uL (ref 0.0–0.1)
EOS%: 0.6 % (ref 0.0–7.0)
Eosinophils Absolute: 0 10*3/uL (ref 0.0–0.5)
LYMPH%: 8.7 % — ABNORMAL LOW (ref 14.0–49.7)
MCH: 32.7 pg (ref 25.1–34.0)
MCV: 94.7 fL (ref 79.5–101.0)
MONO%: 7.9 % (ref 0.0–14.0)
NEUT#: 4 10*3/uL (ref 1.5–6.5)
Platelets: 72 10*3/uL — ABNORMAL LOW (ref 145–400)
RBC: 3.39 10*6/uL — ABNORMAL LOW (ref 3.70–5.45)
RDW: 14.7 % — ABNORMAL HIGH (ref 11.2–14.5)
nRBC: 3 % — ABNORMAL HIGH (ref 0–0)

## 2012-12-10 MED ORDER — DIPHENHYDRAMINE HCL 25 MG PO CAPS
50.0000 mg | ORAL_CAPSULE | Freq: Once | ORAL | Status: AC
  Administered 2012-12-10: 50 mg via ORAL

## 2012-12-10 MED ORDER — HEPARIN SOD (PORK) LOCK FLUSH 100 UNIT/ML IV SOLN
500.0000 [IU] | Freq: Once | INTRAVENOUS | Status: DC | PRN
  Filled 2012-12-10: qty 5

## 2012-12-10 MED ORDER — SODIUM CHLORIDE 0.9 % IV SOLN: Freq: Once | INTRAVENOUS | Status: DC

## 2012-12-10 MED ORDER — PROCHLORPERAZINE EDISYLATE 5 MG/ML IJ SOLN
10.0000 mg | Freq: Once | INTRAMUSCULAR | Status: AC
  Administered 2012-12-10: 10 mg via INTRAVENOUS

## 2012-12-10 MED ORDER — SODIUM CHLORIDE 0.9 % IJ SOLN
10.0000 mL | INTRAMUSCULAR | Status: DC | PRN
  Filled 2012-12-10: qty 10

## 2012-12-10 MED ORDER — ACETAMINOPHEN 325 MG PO TABS
650.0000 mg | ORAL_TABLET | Freq: Once | ORAL | Status: AC
  Administered 2012-12-10: 650 mg via ORAL

## 2012-12-10 MED ORDER — SODIUM CHLORIDE 0.9 % IV SOLN: 800.0000 mg/m2 | Freq: Once | INTRAVENOUS | Status: DC

## 2012-12-10 MED ORDER — TRASTUZUMAB CHEMO INJECTION 440 MG
2.0000 mg/kg | Freq: Once | INTRAVENOUS | Status: AC
  Administered 2012-12-10: 126 mg via INTRAVENOUS
  Filled 2012-12-10: qty 6

## 2012-12-10 MED ORDER — SODIUM CHLORIDE 0.9 % IJ SOLN
10.0000 mL | INTRAMUSCULAR | Status: DC | PRN
  Administered 2012-12-10: 10 mL
  Filled 2012-12-10: qty 10

## 2012-12-10 MED ORDER — HEPARIN SOD (PORK) LOCK FLUSH 100 UNIT/ML IV SOLN
500.0000 [IU] | Freq: Once | INTRAVENOUS | Status: AC | PRN
  Administered 2012-12-10: 500 [IU]
  Filled 2012-12-10: qty 5

## 2012-12-10 MED ORDER — PROCHLORPERAZINE EDISYLATE 5 MG/ML IJ SOLN
10.0000 mg | Freq: Once | INTRAMUSCULAR | Status: DC
  Administered 2012-12-10: 10 mg via INTRAVENOUS

## 2012-12-10 MED ORDER — SODIUM CHLORIDE 0.9 % IV SOLN
800.0000 mg/m2 | Freq: Once | INTRAVENOUS | Status: AC
  Administered 2012-12-10: 1368 mg via INTRAVENOUS
  Filled 2012-12-10: qty 36

## 2012-12-10 MED ORDER — SODIUM CHLORIDE 0.9 % IV SOLN
Freq: Once | INTRAVENOUS | Status: AC
  Administered 2012-12-10: 16:00:00 via INTRAVENOUS

## 2012-12-10 NOTE — Patient Instructions (Addendum)
[facility] Discharge Instructions for Patients Receiving Chemotherapy  Today you received the following chemotherapy agents :  Gemzar,  Herceptin.  To help prevent nausea and vomiting after your treatment, we encourage you to take your nausea medication as instructed by your physician.    If you develop nausea and vomiting that is not controlled by your nausea medication, call the clinic. If it is after clinic hours your family physician or the after hours number for the clinic or go to the Emergency Department.   BELOW ARE SYMPTOMS THAT SHOULD BE REPORTED IMMEDIATELY:  *FEVER GREATER THAN 100.5 F  *CHILLS WITH OR WITHOUT FEVER  NAUSEA AND VOMITING THAT IS NOT CONTROLLED WITH YOUR NAUSEA MEDICATION  *UNUSUAL SHORTNESS OF BREATH  *UNUSUAL BRUISING OR BLEEDING  TENDERNESS IN MOUTH AND THROAT WITH OR WITHOUT PRESENCE OF ULCERS  *URINARY PROBLEMS  *BOWEL PROBLEMS  UNUSUAL RASH Items with * indicate a potential emergency and should be followed up as soon as possible.  One of the nurses will contact you 24 hours after your treatment. Please let the nurse know about any problems that you may have experienced. Feel free to call the clinic you have any questions or concerns. The clinic phone number is [phone].   I have been informed and understand all the instructions given to me. I know to contact the clinic, my physician, or go to the Emergency Department if any problems should occur. I do not have any questions at this time, but understand that I may call the clinic during office hours   should I have any questions or need assistance in obtaining follow up care.    __________________________________________  _____________  __________ Signature of Patient or Authorized Representative            Date                   Time    __________________________________________ Nurse's Signature

## 2012-12-10 NOTE — Patient Instructions (Addendum)
Doing well. Proceed with chemotherapy.  We will set you up for f/u with Dr. Dwain Sarna.  Please call us if you have any questions or concerns.

## 2012-12-10 NOTE — Telephone Encounter (Signed)
[facility] with Dr.Wakefield , they will call patient since appt is ASAP., pt aware to call them if not notified of appt with surgeon

## 2012-12-10 NOTE — Progress Notes (Signed)
OFFICE PROGRESS NOTE  CC  Jessica Alken, MD [address] [place] [address]  DIAGNOSIS: 67 year old female with new diagnosis of inflammatory breast cancer  PRIOR THERAPY: #1 patient was originally seen in my clinic for new diagnosis of inflammatory breast cancer she was referred by Dr. Emelia Loron. She had a mammogram performed That showed an abnormality. That was biopsied and it showed an invasive mammary carcinoma with lymphovascular invasion grade 3 ER negative PR negative HER-2/neu positive.  #2 patient has gone on to have MRI of the breasts performed the MRI shows diffuse right breast neoplasm on a large level I right axillary lymph nodes compatible with lymphatic spread.Patient has had a biopsy of the right axillary lymph node that is compatible with invasive mammary carcinoma.  #3 patient  began neoadjuvant FEC 100 with day 2 Neulasta on 07/23/2012  #4 Weekly neoadjuvant Taxol and Herceptin starting 09/17/12, patient developed neuropathies, and taxol was discontinued early and patient received 2 weeks of herceptin only.  She completed 7 weeks of Taxol/Herceptin combination therapy.    #5 Weekly Gemcitabine and Herceptin starting on 11/19/12  CURRENT THERAPY: Herceptin/Gemcitabine cycle 4  INTERVAL HISTORY: Jessica Wells 67 y.o. female returns for chemotherapy today. She continues to experience fatigue, and has daily loose bowel movements accompanied by gas.  She isn't having any abd pain or cramping.  Her neuropathies remain unchanged on the Neurontin.  She had her MRI and it showed complete resolution of her breast mass.  Otherwise, a 10 point ROS is neg.  MEDICAL HISTORY: Past Medical History  Diagnosis Date  . Asthma   . GERD (gastroesophageal reflux disease)   . Dysrhythmia     PAT-sees dr Laurence Compton meds  . Breast cancer     inflammatory right breast ca  . Allergy   . PAT (paroxysmal atrial tachycardia)     hx  . Arthritis    osteopenia,knees  . PONV (postoperative nausea and vomiting) 09-13-12    severe, with Port-a-cath, was managed without PONV    ALLERGIES:  is allergic to anesthetics, amide; bactrim; sulfa antibiotics; codeine; codeine phosphate; and eggs or egg-derived products.  MEDICATIONS:  Current Outpatient Prescriptions  Medication Sig Dispense Refill  . albuterol (PROVENTIL HFA;VENTOLIN HFA) 108 (90 BASE) MCG/ACT inhaler Inhale 2 puffs into the lungs every 6 (six) hours as needed.      . Alum & Mag Hydroxide-Simeth (MAGIC MOUTHWASH W/LIDOCAINE) SOLN Take 5 mLs by mouth 4 (four) times daily as needed.  60 mL  0  . aspirin EC 81 MG tablet Take 162 mg by mouth every 6 (six) hours as needed. For blood clot symptoms      . b complex vitamins tablet Take 1 tablet by mouth daily.      . calcium-vitamin D (OSCAL WITH D) 500-200 MG-UNIT per tablet Take 1 tablet by mouth daily.      . ciprofloxacin (CIPRO) 500 MG tablet Take 500 mg by mouth 2 (two) times daily. As needed      . dexamethasone (DECADRON) 4 MG tablet Take 2 tablets (8 mg total) by mouth 2 (two) times daily with a meal. Take two tablets twice a day the day before chemo, then two tablets twice a day for three days after chemo.  90 tablet  2  . fish oil-omega-3 fatty acids 1000 MG capsule Take 2 g by mouth daily.      . Flaxseed, Linseed, (FLAX SEEDS PO) Take 1 tablet by mouth daily.      Marland Kitchen  fluconazole (DIFLUCAN) 200 MG tablet Take 1 tablet (200 mg total) by mouth daily.  8 tablet  0  . fluticasone (FLONASE) 50 MCG/ACT nasal spray Place 1 spray into the nose daily as needed. For allergies      . gabapentin (NEURONTIN) 100 MG capsule Take 300 mg by mouth at bedtime.      Marland Kitchen glucosamine-chondroitin 500-400 MG tablet Take 1 tablet by mouth daily.      Marland Kitchen lidocaine-prilocaine (EMLA) cream Apply topically as needed.  30 g  5  . metoprolol succinate (TOPROL XL) 50 MG 24 hr tablet Take 1 tablet (50 mg total) by mouth daily. Take with or immediately following  a meal.  30 tablet  3  . Multiple Vitamin (MULTIVITAMIN WITH MINERALS) TABS Take 1 tablet by mouth daily.      Marland Kitchen omeprazole (PRILOSEC) 20 MG capsule Take 20 mg by mouth daily as needed. For heartburn      . ondansetron (ZOFRAN) 8 MG tablet       . oxyCODONE-acetaminophen (ROXICET) 5-325 MG per tablet Take 1 tablet by mouth every 4 (four) hours as needed for pain.  15 tablet  0  . valACYclovir (VALTREX) 500 MG tablet Take 1 tablet (500 mg total) by mouth 3 (three) times daily.  90 tablet  6   No current facility-administered medications for this visit.   Facility-Administered Medications Ordered in Other Visits  Medication Dose Route Frequency Provider Last Rate Last Dose  . 0.9 %  sodium chloride infusion   Intravenous Once Victorino December, MD      . sodium chloride 0.9 % injection 10 mL  10 mL Intracatheter PRN Victorino December, MD        SURGICAL HISTORY:  Past Surgical History  Procedure Date  . Dilation and curettage of uterus     x3  . Tonsillectomy   . Colonoscopy   . Portacath placement 07/21/2012    Procedure: INSERTION PORT-A-CATH;  Surgeon: Emelia Loron, MD;  Location:  [facility];  Service: General;  Laterality: Left;    REVIEW OF SYSTEMS:   General: fatigue (+), night sweats (-), fever (-), pain (-) Lymph: palpable nodes (-) HEENT: vision changes (-), mucositis (-), gum bleeding (-), epistaxis (-) Cardiovascular: chest pain (-), palpitations (-) Pulmonary: shortness of breath (-), dyspnea on exertion (-), cough (-), hemoptysis (-) GI:  Early satiety (-), melena (-), dysphagia (-), nausea/vomiting (-), diarrhea (+) GU: dysuria (-), hematuria (-), incontinence (-) Musculoskeletal: joint swelling (-), joint pain (-), back pain (-) Neuro: weakness (-), numbness (+), headache (-), confusion (-) Skin: Rash (-), lesions (-), dryness (-) Psych: depression (-), suicidal/homicidal ideation (-), feeling of hopelessness (-)   PHYSICAL EXAMINATION:  BP 154/77   Pulse 101  Temp 98.3 F (36.8 C)  Resp 20  Ht 5\' 1"  (1.549 m)  Wt 146 lb 6.4 oz (66.407 kg)  BMI 27.66 kg/m2 General: Patient is a well appearing female in no acute distress HEENT: PERRLA, sclerae anicteric no conjunctival pallor, MMM Neck: supple, no palpable adenopathy Lungs: clear to auscultation bilaterally, no wheezes, rhonchi, or rales Cardiovascular: regular rate rhythm, S1, S2, no murmurs, rubs or gallops Abdomen: Soft, non-tender, non-distended, normoactive bowel sounds, no HSM Extremities: warm and well perfused, no clubbing, cyanosis, or edema, 1+LLE edema Skin: No rashes or lesions Neuro: Non-focal, gait, tandem gait intact Right breast reveals much improvement in  Dimpling of the skin nipple is now more prominent and I am not able to feel a  mass ECOG PERFORMANCE STATUS: 0 - Asymptomatic   LABORATORY DATA: Lab Results  Component Value Date   WBC 4.8 12/10/2012   HGB 11.1* 12/10/2012   HCT 32.1* 12/10/2012   MCV 94.7 12/10/2012   PLT 72* 12/10/2012      Chemistry      Component Value Date/Time   NA 138 12/03/2012 1046   NA 137 09/15/2012 1330   K 3.7 12/03/2012 1046   K 3.4* 09/15/2012 1330   CL 103 12/03/2012 1046   CL 100 09/15/2012 1330   CO2 25 12/03/2012 1046   CO2 27 09/15/2012 1330   BUN 28.0* 12/03/2012 1046   BUN 9 09/15/2012 1330   CREATININE 0.8 12/03/2012 1046   CREATININE 0.66 09/15/2012 1330      Component Value Date/Time   CALCIUM 9.0 12/03/2012 1046   CALCIUM 9.3 09/15/2012 1330   ALKPHOS 52 12/03/2012 1046   ALKPHOS 96 09/10/2012 1401   AST 16 12/03/2012 1046   AST 12 09/10/2012 1401   ALT 39 12/03/2012 1046   ALT 16 09/10/2012 1401   BILITOT 0.35 12/03/2012 1046   BILITOT 0.9 09/10/2012 1401       RADIOGRAPHIC STUDIES:  ASSESSMENT: 67 year old female with  #1 inflammatory right breast cancer with positive lymph nodes the tumor is ER negative PR negative HER-2/neu positive. Patient is  to receive neoadjuvant chemotherapy intially Consisting of FEC q 2  weeks x 4 cycles. Then she will receive taxol and herceptin q week x 12 weeks.Once patient completes her chemotherapy then we will get restaging MRIs performed to evaluate response to therapy. Patient does understand that she will most likely need a mastectomy because she does have inflammatory breast cancer.  #2 Tachycardia  #3 Peripheral neuropathy  PLAN:   #1 Ms. Lardner continues to do well. Due to her numbness we stopped giving her the Taxol.  She has started Gemzar/Herceptin and is doing well with this.  Her platelets are now 72. She will proceed with chemotherapy today.  I have referred her back for an appt with Dr. Dwain Sarna.   #2 Ms. Taddeo's tachycardia is improved.  She was started on Toprol XL and is tolerating it well.  She does have some LLE swelling.  After I discovered it on her physical exam, she states she gets it in one leg or the other intermittently and it goes away on its own.  Should it get any worse she will call the office and we will order a doppler.  #3 I will see her back next week for her last cycle of Gemzar Herceptin.    All questions were answered. The patient knows to call the clinic with any problems, questions or concerns. We can certainly see the patient much sooner if necessary.  I spent 25 minutes counseling the patient face to face. The total time spent in the appointment was 30 minutes.  This case was reviewed with Dr. Welton Flakes.  Cherie Ouch Lyn Hollingshead, NP Medical Oncology [facility] Phone: [phone] 12/10/2012, 2:44 PM

## 2012-12-10 NOTE — Progress Notes (Signed)
Proceed with chemo despite low platelet counts as per Lillia Abed, NP.

## 2012-12-17 ENCOUNTER — Encounter: Payer: Self-pay | Admitting: Adult Health

## 2012-12-17 ENCOUNTER — Ambulatory Visit (HOSPITAL_BASED_OUTPATIENT_CLINIC_OR_DEPARTMENT_OTHER): Payer: Medicare Other

## 2012-12-17 ENCOUNTER — Ambulatory Visit (HOSPITAL_BASED_OUTPATIENT_CLINIC_OR_DEPARTMENT_OTHER): Payer: Medicare Other | Admitting: Adult Health

## 2012-12-17 ENCOUNTER — Other Ambulatory Visit (HOSPITAL_BASED_OUTPATIENT_CLINIC_OR_DEPARTMENT_OTHER): Payer: Medicare Other | Admitting: Lab

## 2012-12-17 ENCOUNTER — Telehealth: Payer: Self-pay | Admitting: *Deleted

## 2012-12-17 ENCOUNTER — Telehealth: Payer: Self-pay | Admitting: Oncology

## 2012-12-17 VITALS — BP 143/80 | HR 90 | Temp 98.1°F | Resp 20 | Ht 61.0 in | Wt 145.6 lb

## 2012-12-17 DIAGNOSIS — G609 Hereditary and idiopathic neuropathy, unspecified: Secondary | ICD-10-CM

## 2012-12-17 DIAGNOSIS — C50119 Malignant neoplasm of central portion of unspecified female breast: Secondary | ICD-10-CM

## 2012-12-17 DIAGNOSIS — C773 Secondary and unspecified malignant neoplasm of axilla and upper limb lymph nodes: Secondary | ICD-10-CM

## 2012-12-17 DIAGNOSIS — Z5112 Encounter for antineoplastic immunotherapy: Secondary | ICD-10-CM

## 2012-12-17 DIAGNOSIS — C50919 Malignant neoplasm of unspecified site of unspecified female breast: Secondary | ICD-10-CM

## 2012-12-17 DIAGNOSIS — R000 Tachycardia, unspecified: Secondary | ICD-10-CM

## 2012-12-17 DIAGNOSIS — R600 Localized edema: Secondary | ICD-10-CM

## 2012-12-17 DIAGNOSIS — Z5111 Encounter for antineoplastic chemotherapy: Secondary | ICD-10-CM | POA: Diagnosis not present

## 2012-12-17 LAB — CBC WITH DIFFERENTIAL/PLATELET
Eosinophils Absolute: 0 10*3/uL (ref 0.0–0.5)
MONO#: 0.6 10*3/uL (ref 0.1–0.9)
MONO%: 8.6 % (ref 0.0–14.0)
NEUT#: 5.3 10*3/uL (ref 1.5–6.5)
RBC: 3.33 10*6/uL — ABNORMAL LOW (ref 3.70–5.45)
RDW: 15.6 % — ABNORMAL HIGH (ref 11.2–14.5)
WBC: 6.8 10*3/uL (ref 3.9–10.3)

## 2012-12-17 LAB — COMPREHENSIVE METABOLIC PANEL (CC13)
Alkaline Phosphatase: 64 U/L (ref 40–150)
BUN: 23 mg/dL (ref 7.0–26.0)
CO2: 23 mEq/L (ref 22–29)
Creatinine: 1 mg/dL (ref 0.6–1.1)
Glucose: 395 mg/dl — ABNORMAL HIGH (ref 70–99)
Total Bilirubin: 0.46 mg/dL (ref 0.20–1.20)

## 2012-12-17 LAB — TECHNOLOGIST REVIEW

## 2012-12-17 MED ORDER — ACETAMINOPHEN 325 MG PO TABS
650.0000 mg | ORAL_TABLET | Freq: Once | ORAL | Status: AC
  Administered 2012-12-17: 650 mg via ORAL

## 2012-12-17 MED ORDER — PROCHLORPERAZINE MALEATE 10 MG PO TABS
10.0000 mg | ORAL_TABLET | Freq: Once | ORAL | Status: AC
  Administered 2012-12-17: 10 mg via ORAL

## 2012-12-17 MED ORDER — TRASTUZUMAB CHEMO INJECTION 440 MG
2.0000 mg/kg | Freq: Once | INTRAVENOUS | Status: AC
  Administered 2012-12-17: 126 mg via INTRAVENOUS
  Filled 2012-12-17: qty 6

## 2012-12-17 MED ORDER — SODIUM CHLORIDE 0.9 % IV SOLN
800.0000 mg/m2 | Freq: Once | INTRAVENOUS | Status: AC
  Administered 2012-12-17: 1368 mg via INTRAVENOUS
  Filled 2012-12-17: qty 36

## 2012-12-17 MED ORDER — DIPHENHYDRAMINE HCL 25 MG PO CAPS
50.0000 mg | ORAL_CAPSULE | Freq: Once | ORAL | Status: AC
  Administered 2012-12-17: 50 mg via ORAL

## 2012-12-17 MED ORDER — SODIUM CHLORIDE 0.9 % IJ SOLN
10.0000 mL | INTRAMUSCULAR | Status: DC | PRN
  Administered 2012-12-17: 10 mL
  Filled 2012-12-17: qty 10

## 2012-12-17 MED ORDER — SODIUM CHLORIDE 0.9 % IV SOLN
Freq: Once | INTRAVENOUS | Status: AC
  Administered 2012-12-17: 13:00:00 via INTRAVENOUS

## 2012-12-17 MED ORDER — HEPARIN SOD (PORK) LOCK FLUSH 100 UNIT/ML IV SOLN
500.0000 [IU] | Freq: Once | INTRAVENOUS | Status: AC | PRN
  Administered 2012-12-17: 500 [IU]
  Filled 2012-12-17: qty 5

## 2012-12-17 NOTE — Telephone Encounter (Signed)
appts complete. gv pt appt schedule for February thru April while she was still in infusion. Schedule includes appts w/Dr. Frederich Cha, Dr. Dwain Sarna and doppler.

## 2012-12-17 NOTE — Telephone Encounter (Signed)
12/23/12, 01/14/13, 3/7, 3/28, 4/18  Sent michelle email to set up treatment

## 2012-12-17 NOTE — Patient Instructions (Addendum)
Doing well.  Proceed with chemotherapy.  We will see you next week.

## 2012-12-17 NOTE — Patient Instructions (Addendum)
[facility] Discharge Instructions for Patients Receiving Chemotherapy  Today you received the following chemotherapy agents :Gemzar,  Herceptin  To help prevent nausea and vomiting after your treatment, we encourage you to take your nausea medication as directed by your MD. If you develop nausea and vomiting that is not controlled by your nausea medication, call the clinic.    BELOW ARE SYMPTOMS THAT SHOULD BE REPORTED IMMEDIATELY:  *FEVER GREATER THAN 100.5 F  *CHILLS WITH OR WITHOUT FEVER  NAUSEA AND VOMITING THAT IS NOT CONTROLLED WITH YOUR NAUSEA MEDICATION  *UNUSUAL SHORTNESS OF BREATH  *UNUSUAL BRUISING OR BLEEDING  TENDERNESS IN MOUTH AND THROAT WITH OR WITHOUT PRESENCE OF ULCERS  *URINARY PROBLEMS  *BOWEL PROBLEMS  UNUSUAL RASH Items with * indicate a potential emergency and should be followed up as soon as possible.  Feel free to call the clinic you have any questions or concerns. The clinic phone number is [phone].

## 2012-12-17 NOTE — Progress Notes (Signed)
OFFICE PROGRESS NOTE  CC  [name], MD [address] [place] [address]  DIAGNOSIS: 67 year old female with new diagnosis of inflammatory breast cancer  PRIOR THERAPY: #1 patient was originally seen in my clinic for new diagnosis of inflammatory breast cancer she was referred by Dr. Emelia Loron. She had a mammogram performed That showed an abnormality. That was biopsied and it showed an invasive mammary carcinoma with lymphovascular invasion grade 3 ER negative PR negative HER-2/neu positive.  #2 patient has gone on to have MRI of the breasts performed the MRI shows diffuse right breast neoplasm on a large level I right axillary lymph nodes compatible with lymphatic spread.Patient has had a biopsy of the right axillary lymph node that is compatible with invasive mammary carcinoma.  #3 patient  began neoadjuvant FEC 100 with day 2 Neulasta on 07/23/2012  #4 Weekly neoadjuvant Taxol and Herceptin starting 09/17/12, patient developed neuropathies, and taxol was discontinued early and patient received 2 weeks of herceptin only.  She completed 7 weeks of Taxol/Herceptin combination therapy.    #5 Weekly Gemcitabine and Herceptin starting on 11/19/12  CURRENT THERAPY: Herceptin/Gemcitabine cycle 5  INTERVAL HISTORY: Jessica Wells 67 y.o. female returns for chemotherapy today. She continues to experience fatigue, and has similar GI distress as before, however, she is very excited that today is her last cycle.  She has surgery scheduled on 01/04/13.  She has tapered herself off of neurontin and has noticed no change in her neuropathy.  She's otherwise feeling well and w/o questions/concerns.  A 10 point ROS is neg.   MEDICAL HISTORY: Past Medical History  Diagnosis Date  . Asthma   . GERD (gastroesophageal reflux disease)   . Dysrhythmia     PAT-sees dr Laurence Compton meds  . Breast cancer     inflammatory right breast ca  . Allergy   . PAT (paroxysmal atrial  tachycardia)     hx  . Arthritis     osteopenia,knees  . PONV (postoperative nausea and vomiting) 09-13-12    severe, with Port-a-cath, was managed without PONV    ALLERGIES:  is allergic to anesthetics, amide; bactrim; sulfa antibiotics; codeine; codeine phosphate; and eggs or egg-derived products.  MEDICATIONS:  Current Outpatient Prescriptions  Medication Sig Dispense Refill  . albuterol (PROVENTIL HFA;VENTOLIN HFA) 108 (90 BASE) MCG/ACT inhaler Inhale 2 puffs into the lungs every 6 (six) hours as needed.      . Alum & Mag Hydroxide-Simeth (MAGIC MOUTHWASH W/LIDOCAINE) SOLN Take 5 mLs by mouth 4 (four) times daily as needed.  60 mL  0  . b complex vitamins tablet Take 1 tablet by mouth daily.      . calcium-vitamin D (OSCAL WITH D) 500-200 MG-UNIT per tablet Take 1 tablet by mouth daily.      . fish oil-omega-3 fatty acids 1000 MG capsule Take 2 g by mouth daily.      . Flaxseed, Linseed, (FLAX SEEDS PO) Take 1 tablet by mouth daily.      . fluconazole (DIFLUCAN) 200 MG tablet Take 1 tablet (200 mg total) by mouth daily.  8 tablet  0  . fluticasone (FLONASE) 50 MCG/ACT nasal spray Place 1 spray into the nose daily as needed. For allergies      . glucosamine-chondroitin 500-400 MG tablet Take 1 tablet by mouth daily.      Marland Kitchen lidocaine-prilocaine (EMLA) cream Apply topically as needed.  30 g  5  . metoprolol succinate (TOPROL XL) 50 MG 24 hr tablet Take  1 tablet (50 mg total) by mouth daily. Take with or immediately following a meal.  30 tablet  3  . Multiple Vitamin (MULTIVITAMIN WITH MINERALS) TABS Take 1 tablet by mouth daily.      Marland Kitchen omeprazole (PRILOSEC) 20 MG capsule Take 20 mg by mouth daily as needed. For heartburn      . ondansetron (ZOFRAN) 8 MG tablet       . oxyCODONE-acetaminophen (ROXICET) 5-325 MG per tablet Take 1 tablet by mouth every 4 (four) hours as needed for pain.  15 tablet  0  . valACYclovir (VALTREX) 500 MG tablet Take 1 tablet (500 mg total) by mouth 3 (three)  times daily.  90 tablet  6   No current facility-administered medications for this visit.   Facility-Administered Medications Ordered in Other Visits  Medication Dose Route Frequency Provider Last Rate Last Dose  . Gemcitabine HCl (GEMZAR) 1,368 mg in sodium chloride 0.9 % 100 mL chemo infusion  800 mg/m2 (Treatment Plan Actual) Intravenous Once Victorino December, MD      . heparin lock flush 100 unit/mL  500 Units Intracatheter Once PRN Victorino December, MD      . sodium chloride 0.9 % injection 10 mL  10 mL Intracatheter PRN Victorino December, MD      . trastuzumab (HERCEPTIN) 126 mg in sodium chloride 0.9 % 250 mL chemo infusion  2 mg/kg (Treatment Plan Actual) Intravenous Once Augustin Schooling, NP        SURGICAL HISTORY:  Past Surgical History  Procedure Date  . Dilation and curettage of uterus     x3  . Tonsillectomy   . Colonoscopy   . Portacath placement 07/21/2012    Procedure: INSERTION PORT-A-CATH;  Surgeon: Emelia Loron, MD;  Location: [facility];  Service: General;  Laterality: Left;    REVIEW OF SYSTEMS:   General: fatigue (+), night sweats (-), fever (-), pain (-) Lymph: palpable nodes (-) HEENT: vision changes (-), mucositis (-), gum bleeding (-), epistaxis (-) Cardiovascular: chest pain (-), palpitations (-) Pulmonary: shortness of breath (-), dyspnea on exertion (-), cough (-), hemoptysis (-) GI:  Early satiety (-), melena (-), dysphagia (-), nausea/vomiting (-), diarrhea (+) GU: dysuria (-), hematuria (-), incontinence (-) Musculoskeletal: joint swelling (-), joint pain (-), back pain (-) Neuro: weakness (-), numbness (+), headache (-), confusion (-) Skin: Rash (-), lesions (-), dryness (-) Psych: depression (-), suicidal/homicidal ideation (-), feeling of hopelessness (-)   PHYSICAL EXAMINATION:  BP 143/80  Pulse 90  Temp 98.1 F (36.7 C)  Resp 20  Ht 5\' 1"  (1.549 m)  Wt 145 lb 9.6 oz (66.044 kg)  BMI 27.51 kg/m2 General: Patient is a well  appearing female in no acute distress HEENT: PERRLA, sclerae anicteric no conjunctival pallor, MMM Neck: supple, no palpable adenopathy Lungs: clear to auscultation bilaterally, no wheezes, rhonchi, or rales Cardiovascular: regular rate rhythm, S1, S2, no murmurs, rubs or gallops Abdomen: Soft, non-tender, non-distended, normoactive bowel sounds, no HSM Extremities: warm and well perfused, no clubbing, cyanosis, or edema, 1+LLE edema Skin: No rashes or lesions Neuro: Non-focal, gait, tandem gait intact Right breast reveals much improvement in  Dimpling of the skin nipple is now more prominent and I am not able to feel a mass ECOG PERFORMANCE STATUS: 0 - Asymptomatic   LABORATORY DATA: Lab Results  Component Value Date   WBC 6.8 12/17/2012   HGB 10.9* 12/17/2012   HCT 31.5* 12/17/2012   MCV 94.6 12/17/2012  PLT 102* 12/17/2012      Chemistry      Component Value Date/Time   NA 135* 12/10/2012 1232   NA 137 09/15/2012 1330   K 4.0 12/10/2012 1232   K 3.4* 09/15/2012 1330   CL 98 12/10/2012 1232   CL 100 09/15/2012 1330   CO2 26 12/10/2012 1232   CO2 27 09/15/2012 1330   BUN 24.0 12/10/2012 1232   BUN 9 09/15/2012 1330   CREATININE 0.7 12/10/2012 1232   CREATININE 0.66 09/15/2012 1330      Component Value Date/Time   CALCIUM 9.0 12/10/2012 1232   CALCIUM 9.3 09/15/2012 1330   ALKPHOS 62 12/10/2012 1232   ALKPHOS 96 09/10/2012 1401   AST 17 12/10/2012 1232   AST 12 09/10/2012 1401   ALT 43 12/10/2012 1232   ALT 16 09/10/2012 1401   BILITOT 0.42 12/10/2012 1232   BILITOT 0.9 09/10/2012 1401       RADIOGRAPHIC STUDIES:  ASSESSMENT: 67 year old female with  #1 inflammatory right breast cancer with positive lymph nodes the tumor is ER negative PR negative HER-2/neu positive. Patient is  to receive neoadjuvant chemotherapy intially Consisting of FEC q 2 weeks x 4 cycles. Then she will receive taxol and herceptin q week x 12 weeks.Once patient completes her chemotherapy then we will  get restaging MRIs performed to evaluate response to therapy. Patient does understand that she will most likely need a mastectomy because she does have inflammatory breast cancer.  #2 Tachycardia  #3 Peripheral neuropathy  PLAN:   #1 Ms. Arpin will proceed with her last cycle of Gemzar/Herceptin today.  Doing well.  I have referred her back to rad-onc and she will proceed with her surgery on 01/04/13  #2 Ms. Cullars's tachycardia is improved.  She was started on Toprol XL and is tolerating it well.  She does have some LLE swelling.  This has remained therefore I ordered her a Doppler.    #3 I will see her back next week for labs, and on 2/14 for eval and Herceptin.  At that point she will be treated with q3wk herceptin.    All questions were answered. The patient knows to call the clinic with any problems, questions or concerns. We can certainly see the patient much sooner if necessary.  I spent 25 minutes counseling the patient face to face. The total time spent in the appointment was 30 minutes.  This case was reviewed with Dr. Welton Flakes.  Cherie Ouch Lyn Hollingshead, NP Medical Oncology [facility] Phone: [phone] 12/17/2012, 1:15 PM

## 2012-12-22 ENCOUNTER — Encounter: Payer: Self-pay | Admitting: *Deleted

## 2012-12-22 ENCOUNTER — Encounter: Payer: Self-pay | Admitting: Physician Assistant

## 2012-12-22 ENCOUNTER — Ambulatory Visit: Payer: Medicare Other | Admitting: Lab

## 2012-12-22 ENCOUNTER — Ambulatory Visit (HOSPITAL_BASED_OUTPATIENT_CLINIC_OR_DEPARTMENT_OTHER): Payer: Medicare Other | Admitting: Physician Assistant

## 2012-12-22 ENCOUNTER — Ambulatory Visit (HOSPITAL_COMMUNITY)
Admission: RE | Admit: 2012-12-22 | Discharge: 2012-12-22 | Disposition: A | Discharge status: Home / Self Care | Payer: Medicare Other | Source: Ambulatory Visit | Attending: Oncology | Admitting: Oncology

## 2012-12-22 ENCOUNTER — Telehealth: Payer: Self-pay | Admitting: Oncology

## 2012-12-22 VITALS — BP 150/80 | HR 87 | Temp 98.2°F | Resp 20 | Ht 61.0 in | Wt 146.5 lb

## 2012-12-22 DIAGNOSIS — Z86718 Personal history of other venous thrombosis and embolism: Secondary | ICD-10-CM | POA: Diagnosis not present

## 2012-12-22 DIAGNOSIS — I82409 Acute embolism and thrombosis of unspecified deep veins of unspecified lower extremity: Secondary | ICD-10-CM

## 2012-12-22 DIAGNOSIS — C50919 Malignant neoplasm of unspecified site of unspecified female breast: Secondary | ICD-10-CM | POA: Diagnosis not present

## 2012-12-22 DIAGNOSIS — C773 Secondary and unspecified malignant neoplasm of axilla and upper limb lymph nodes: Secondary | ICD-10-CM

## 2012-12-22 DIAGNOSIS — I82403 Acute embolism and thrombosis of unspecified deep veins of lower extremity, bilateral: Secondary | ICD-10-CM | POA: Insufficient documentation

## 2012-12-22 DIAGNOSIS — R609 Edema, unspecified: Secondary | ICD-10-CM | POA: Diagnosis not present

## 2012-12-22 DIAGNOSIS — Z79899 Other long term (current) drug therapy: Secondary | ICD-10-CM | POA: Diagnosis not present

## 2012-12-22 DIAGNOSIS — R600 Localized edema: Secondary | ICD-10-CM

## 2012-12-22 DIAGNOSIS — C50119 Malignant neoplasm of central portion of unspecified female breast: Secondary | ICD-10-CM

## 2012-12-22 DIAGNOSIS — I82819 Embolism and thrombosis of superficial veins of unspecified lower extremities: Secondary | ICD-10-CM | POA: Insufficient documentation

## 2012-12-22 DIAGNOSIS — Z171 Estrogen receptor negative status [ER-]: Secondary | ICD-10-CM

## 2012-12-22 LAB — CBC WITH DIFFERENTIAL/PLATELET
Eosinophils Absolute: 0 10*3/uL (ref 0.0–0.5)
LYMPH%: 7.1 % — ABNORMAL LOW (ref 14.0–49.7)
MCV: 93.6 fL (ref 79.5–101.0)
MONO%: 6.7 % (ref 0.0–14.0)
NEUT#: 5.5 10*3/uL (ref 1.5–6.5)
Platelets: 98 10*3/uL — ABNORMAL LOW (ref 145–400)
RBC: 3.26 10*6/uL — ABNORMAL LOW (ref 3.70–5.45)
nRBC: 2 % — ABNORMAL HIGH (ref 0–0)

## 2012-12-22 MED ORDER — ENOXAPARIN SODIUM 80 MG/0.8ML ~~LOC~~ SOLN: 70.0000 mg | Freq: Two times a day (BID) | SUBCUTANEOUS | Status: DC

## 2012-12-22 NOTE — Progress Notes (Signed)
VASCULAR LAB PRELIMINARY  PRELIMINARY  PRELIMINARY  PRELIMINARY  *Bilateral lower extremity venous duplex completed.    Preliminary report:  Bilateral LEV completed due to departmental protocol for DVT of the symptomatic limb. Left - Occlusive extensive DVT coursing from the proximal popliteal through the femoral and common femoral veins. There is a superficial thrombus of the proximal greater saphenous vein into the confluence with the common femoral vein. There is no evidence of a Baker's cyst. Right - Extensive DVT noted coursing from the posterior tibial vein through the popliteal vein. There is no evidence of a superficial thrombus or Baker"s cyst.  Sura Canul, RVS 12/22/2012, 10:54 AM

## 2012-12-22 NOTE — Telephone Encounter (Signed)
appts made and printed for pt,pt sent back to the lab and per pt cx lab and appt with la for 1/23       anne

## 2012-12-22 NOTE — Patient Instructions (Signed)
You have been diagnosed with bilateral  lower extremity DVT. You will need anticoagulation with Lovenox. A prescription was written to the Pharmacy, for Lovenox 70 mg subcutaneously twice daily. Samples will be given to you in addition, since it takes some time to obtain the medicine, and you need it immediately. You will have labs drawn prior to taking the med, to determine if anything is abnormal in the coagulation system. Those labs take a few days to come back, but will let you know on Monday of those results. Today, will check the Hypercoagulable panel and the CBC. On Monday, will check CBC, chemistries. Will continue plans for Herceptin as scheduled. BAsed on the labs, Dr. Welton Flakes is to discuss with Surgeon if Mastectomy is to be performed as scheduled .

## 2012-12-22 NOTE — Progress Notes (Signed)
[facility]  Telephone:(336) [phone] Fax:(336) [phone]   OFFICE PROGRESS NOTE   Cc:  [name], MD [address]  [place] [address]   DIAGNOSIS: 67 year old female with new diagnosis of inflammatory breast cancer   PRIOR THERAPY:  #1 patient was originally seen inclinic for new diagnosis of inflammatory breast cancer she was referred by Dr. Emelia Loron. She had a mammogram performed That showed an abnormality. That was biopsied and it showed an invasive mammary carcinoma with lymphovascular invasion grade 3 ER negative PR negative HER-2/neu positive.   #2 patient has gone on to have MRI of the breasts performed the MRI shows diffuse right breast neoplasm on a large level I right axillary lymph nodes compatible with lymphatic spread.Patient has had a biopsy of the right axillary lymph node that is compatible with invasive mammary carcinoma.   #3 patient began neoadjuvant FEC 100 with day 2 Neulasta on 07/23/2012   #4 Weekly neoadjuvant Taxol and Herceptin starting 09/17/12, patient developed neuropathies, and taxol was discontinued early and patient received 2 weeks of herceptin only. She completed 7 weeks of Taxol/Herceptin combination therapy.   #5 Weekly Gemcitabine and Herceptin starting on 11/19/12   CURRENT THERAPY: Herceptin/Gemcitabine cycle 5   INTERVAL HISTORY:   Jessica Wells 67 y.o. Female, last  Seen at the Cancer Center  on 12/17/1012  For her Cycle 5 of chemo with Herceptin and Gemcitabine. She was clinically stable except for left lower extremity edema requiring further investigation. A lower extremity doppler was ordered, which returned posititive for left occlusive, extensive DVT coursing from the proximal popliteal through the femoral and common femoral veins. There is a superficial thrombus of the proximal greater saphenous vein into the confluence with the common femoral vein. In the RLE, DVT was noted coursing from the  posterior tibial vein through the popliteal vein. There is no evidence of a superficial thrombus or Baker"s cyst.She was seen at our office for management and further evaluation of this new diagnosis. SHe denies shortness of breath, cough, otr fever. She does have some areas of tenderness on the R lower extremity above the calf,On the left, she denies pain, but swelling is still present. She has been on baby ASA over the last 48 hours.  Of note, patient is concerned due to possible delay on her scheduled right radical mastectomy, scheduled by Dr. Dwain Sarna for 01/04/2013, and for plans for  Herceptin to be scheduled on 01/14/2013.  Rest of the 10 point ROS is negative.      Past Medical History  Diagnosis Date  . Asthma   . GERD (gastroesophageal reflux disease)   . Dysrhythmia     PAT-sees dr Laurence Compton meds  . Breast cancer     inflammatory right breast ca  . Allergy   . PAT (paroxysmal atrial tachycardia)     hx  . Arthritis     osteopenia,knees  . PONV (postoperative nausea and vomiting) 09-13-12    severe, with Port-a-cath, was managed without PONV    Past Surgical History  Procedure Date  . Dilation and curettage of uterus     x3  . Tonsillectomy   . Colonoscopy   . Portacath placement 07/21/2012    Procedure: INSERTION PORT-A-CATH;  Surgeon: Emelia Loron, MD;  Location: [facility];  Service: General;  Laterality: Left;    Current Outpatient Prescriptions  Medication Sig Dispense Refill  . albuterol (PROVENTIL HFA;VENTOLIN HFA) 108 (90 BASE) MCG/ACT inhaler Inhale 2 puffs into  the lungs every 6 (six) hours as needed.      . Alum & Mag Hydroxide-Simeth (MAGIC MOUTHWASH W/LIDOCAINE) SOLN Take 5 mLs by mouth 4 (four) times daily as needed.  60 mL  0  . b complex vitamins tablet Take 1 tablet by mouth daily.      . calcium-vitamin D (OSCAL WITH D) 500-200 MG-UNIT per tablet Take 1 tablet by mouth daily.      Marland Kitchen enoxaparin (LOVENOX) 80 MG/0.8ML injection Inject  0.7 mLs (70 mg total) into the skin every 12 (twelve) hours.  60 Syringe  3  . fish oil-omega-3 fatty acids 1000 MG capsule Take 2 g by mouth daily.      . Flaxseed, Linseed, (FLAX SEEDS PO) Take 1 tablet by mouth daily.      . fluconazole (DIFLUCAN) 200 MG tablet Take 1 tablet (200 mg total) by mouth daily.  8 tablet  0  . fluticasone (FLONASE) 50 MCG/ACT nasal spray Place 1 spray into the nose daily as needed. For allergies      . glucosamine-chondroitin 500-400 MG tablet Take 1 tablet by mouth daily.      Marland Kitchen lidocaine-prilocaine (EMLA) cream Apply topically as needed.  30 g  5  . metoprolol succinate (TOPROL XL) 50 MG 24 hr tablet Take 1 tablet (50 mg total) by mouth daily. Take with or immediately following a meal.  30 tablet  3  . Multiple Vitamin (MULTIVITAMIN WITH MINERALS) TABS Take 1 tablet by mouth daily.      Marland Kitchen omeprazole (PRILOSEC) 20 MG capsule Take 20 mg by mouth daily as needed. For heartburn      . ondansetron (ZOFRAN) 8 MG tablet       . oxyCODONE-acetaminophen (ROXICET) 5-325 MG per tablet Take 1 tablet by mouth every 4 (four) hours as needed for pain.  15 tablet  0  . valACYclovir (VALTREX) 500 MG tablet Take 1 tablet (500 mg total) by mouth 3 (three) times daily.  90 tablet  6    ALLERGIES:  is allergic to anesthetics, amide; bactrim; sulfa antibiotics; codeine; codeine phosphate; and eggs or egg-derived products.  REVIEW OF SYSTEMS:    The rest of the 14-point review of system was negative.   Filed Vitals:   12/22/12 1054  BP: 150/80  Pulse: 87  Temp: 98.2 F (36.8 C)  Resp: 20   Wt Readings from Last 3 Encounters:  12/22/12 146 lb 8 oz (66.452 kg)  12/17/12 145 lb 9.6 oz (66.044 kg)  12/10/12 146 lb 6.4 oz (66.407 kg)     PHYSICAL EXAMINATION:  BP 150/80  Pulse 87  Temp 98.2 F (36.8 C) (Oral)  Resp 20  Ht 5\' 1"  (1.549 m)  Wt 146 lb 8 oz (66.452 kg)  BMI 27.68 kg/m2 GENERAL: Well developed, well nourished, in no acute distress.  EENT: No ocular or oral  lesions. No stomatitis.  RESPIRATORY: Lungs are clear to auscultation bilaterally, with no rubs, wheezing, rhonchi or rales.  CARDIAC: No murmur, rub or tachycardia.   GI: Abdomen is soft, no palpable hepatosplenomegaly. No fluid wave. No tenderness. Musculoskeletal: No kyphosis, no tenderness over the spine, ribs or hips. Lymph: No cervical, infraclavicular, axillary or inguinal adenopathy. Extremities: No uppe extremity edema. 1+ lower extremity edema. There is an area of warmth without erythema on the R leg above the knee. On the Left, no areas of tenderness, erythema or warmth. No Homan's Neuro: No focal neurological deficits. Psych: Alert and oriented X 3, appropriate mood  and affect.  Breasts: Not examined today.    LABORATORY/RADIOLOGY DATA:  Lab Results  Component Value Date   WBC 6.4 12/22/2012   HGB 10.6* 12/22/2012   HCT 30.5* 12/22/2012   PLT 98* 12/22/2012   GLUCOSE 395* 12/17/2012   ALKPHOS 64 12/17/2012   ALT 54 12/17/2012   AST 28 12/17/2012   NA 133* 12/17/2012   K 4.3 12/17/2012   CL 98 12/17/2012   CREATININE 1.0 12/17/2012   BUN 23.0 12/17/2012   CO2 23 12/17/2012   INR 1.06 09/15/2012     ASSESSMENT AND PLAN:   1. Jessica Wells 67 y.o. female with   #1 inflammatory right breast cancer with positive lymph nodes the tumor is ER negative PR negative HER-2/neu positive. Patient is s/p neoadjuvant chemotherapy intially Consisting of FEC q 2 weeks x 4 cycles. herceptin q week x 12 weeks is planned, beginning on 01/14/2013.  #2 Tachycardia   #3 Bilateral DVT  PLAN:   #1 Plans for Herceptin to be continued despite the DVT diagnosis. Mastectomy to be performed in the future, although plans may be modified in the setting of DVT. She is to undergo evaluation by Radiation Oncology, Dr. Michell Heinrich, scheduled for 12/24/2012.Once patient completes her chemotherapy then we will get restaging MRIs performed to evaluate response.  #2 Ms. Armon's tachycardia is improved. She was  started on Toprol XL and is tolerating it well.  #3: Bilateral DVT. Will begin Lovenox injections, at 70 mg bid. Lovenox education was started at the [facility]. She is to undergo Hypercoagulable panel testing prior to injection, then will check levels on Monday 12/27/2012, along with CBC and BMET. A prescription for Lovenox was ordered to the Pharmacy. Today, will check CBC, will cancel labs that were schduled for 1/23. Will await for all results for furhter instructions.    All questions were answered. The patient knows to call the clinic with any problems, questions or concerns. We can certainly see the patient much sooner if necessary.  I spent 25 minutes counseling the patient face to face. The total time spent in the appointment was 30 minutes.  This case was reviewed with Dr. Welton Flakes.

## 2012-12-23 ENCOUNTER — Telehealth: Payer: Self-pay | Admitting: *Deleted

## 2012-12-23 ENCOUNTER — Encounter: Payer: Self-pay | Admitting: Radiation Oncology

## 2012-12-23 ENCOUNTER — Other Ambulatory Visit: Payer: Medicare Other | Admitting: Lab

## 2012-12-23 ENCOUNTER — Ambulatory Visit: Payer: Medicare Other | Admitting: Adult Health

## 2012-12-23 DIAGNOSIS — C50919 Malignant neoplasm of unspecified site of unspecified female breast: Secondary | ICD-10-CM | POA: Insufficient documentation

## 2012-12-23 DIAGNOSIS — I82409 Acute embolism and thrombosis of unspecified deep veins of unspecified lower extremity: Secondary | ICD-10-CM | POA: Insufficient documentation

## 2012-12-23 DIAGNOSIS — Z9221 Personal history of antineoplastic chemotherapy: Secondary | ICD-10-CM | POA: Insufficient documentation

## 2012-12-23 DIAGNOSIS — M1990 Unspecified osteoarthritis, unspecified site: Secondary | ICD-10-CM | POA: Insufficient documentation

## 2012-12-23 DIAGNOSIS — I499 Cardiac arrhythmia, unspecified: Secondary | ICD-10-CM | POA: Insufficient documentation

## 2012-12-23 DIAGNOSIS — D689 Coagulation defect, unspecified: Secondary | ICD-10-CM | POA: Insufficient documentation

## 2012-12-23 DIAGNOSIS — J449 Chronic obstructive pulmonary disease, unspecified: Secondary | ICD-10-CM | POA: Insufficient documentation

## 2012-12-23 DIAGNOSIS — K219 Gastro-esophageal reflux disease without esophagitis: Secondary | ICD-10-CM | POA: Insufficient documentation

## 2012-12-23 DIAGNOSIS — J45909 Unspecified asthma, uncomplicated: Secondary | ICD-10-CM | POA: Insufficient documentation

## 2012-12-23 DIAGNOSIS — T7840XA Allergy, unspecified, initial encounter: Secondary | ICD-10-CM | POA: Insufficient documentation

## 2012-12-23 LAB — HYPERCOAGULABLE PANEL, COMPREHENSIVE
Anticardiolipin IgA: 4 APL U/mL (ref <22)
Anticardiolipin IgM: 0 MPL U/mL (ref <11)
Beta-2 Glyco I IgG: 5 G Units (ref <20)
DRVVT: 24.6 secs (ref <42.9)
PTT Lupus Anticoagulant: 28.4 secs (ref 28.0–43.0)
Protein C Activity: 200 % — ABNORMAL HIGH (ref 75–133)
Protein S Activity: 103 % (ref 69–129)

## 2012-12-23 NOTE — Telephone Encounter (Signed)
Per Dr Michell Heinrich, called pt to inquire about her appt tomorrow because pt has not had mastectomy at this time. Pt desires to have surgery prior to seeing Dr Michell Heinrich. Advised pt will FU w/her progress and talk w/her after surgery to schedule consult w/Dr Michell Heinrich. Pt agreed to this. Notified Dr Michell Heinrich of pt's decision, and notified R Flynt to cancel pt's appt.

## 2012-12-24 ENCOUNTER — Ambulatory Visit: Payer: Medicare Other | Admitting: Radiation Oncology

## 2012-12-24 ENCOUNTER — Ambulatory Visit: Payer: Medicare Other

## 2012-12-27 ENCOUNTER — Inpatient Hospital Stay (HOSPITAL_COMMUNITY)
Admission: EM | Admit: 2012-12-27 | Discharge: 2013-01-05 | DRG: 166 | Disposition: A | Discharge status: Home / Self Care | Payer: Medicare Other | Attending: Emergency Medicine | Admitting: Emergency Medicine

## 2012-12-27 ENCOUNTER — Other Ambulatory Visit: Payer: Self-pay

## 2012-12-27 ENCOUNTER — Other Ambulatory Visit (HOSPITAL_BASED_OUTPATIENT_CLINIC_OR_DEPARTMENT_OTHER): Payer: Medicare Other | Admitting: Lab

## 2012-12-27 ENCOUNTER — Ambulatory Visit: Payer: Medicare Other | Admitting: Physician Assistant

## 2012-12-27 ENCOUNTER — Emergency Department (HOSPITAL_COMMUNITY): Payer: Medicare Other

## 2012-12-27 ENCOUNTER — Encounter: Payer: Self-pay | Admitting: Adult Health

## 2012-12-27 ENCOUNTER — Encounter (HOSPITAL_COMMUNITY): Payer: Self-pay

## 2012-12-27 ENCOUNTER — Encounter (HOSPITAL_COMMUNITY): Payer: Self-pay | Admitting: Radiology

## 2012-12-27 ENCOUNTER — Ambulatory Visit (HOSPITAL_BASED_OUTPATIENT_CLINIC_OR_DEPARTMENT_OTHER): Payer: Medicare Other | Admitting: Adult Health

## 2012-12-27 ENCOUNTER — Inpatient Hospital Stay (HOSPITAL_COMMUNITY): Payer: Medicare Other

## 2012-12-27 VITALS — BP 118/78 | HR 139 | Temp 99.7°F | Resp 22 | Ht 61.0 in | Wt 148.0 lb

## 2012-12-27 DIAGNOSIS — C50919 Malignant neoplasm of unspecified site of unspecified female breast: Secondary | ICD-10-CM

## 2012-12-27 DIAGNOSIS — Z452 Encounter for adjustment and management of vascular access device: Secondary | ICD-10-CM | POA: Diagnosis not present

## 2012-12-27 DIAGNOSIS — E871 Hypo-osmolality and hyponatremia: Secondary | ICD-10-CM

## 2012-12-27 DIAGNOSIS — R0902 Hypoxemia: Secondary | ICD-10-CM

## 2012-12-27 DIAGNOSIS — R0989 Other specified symptoms and signs involving the circulatory and respiratory systems: Secondary | ICD-10-CM

## 2012-12-27 DIAGNOSIS — R000 Tachycardia, unspecified: Secondary | ICD-10-CM | POA: Diagnosis present

## 2012-12-27 DIAGNOSIS — K219 Gastro-esophageal reflux disease without esophagitis: Secondary | ICD-10-CM | POA: Diagnosis present

## 2012-12-27 DIAGNOSIS — C50119 Malignant neoplasm of central portion of unspecified female breast: Secondary | ICD-10-CM | POA: Diagnosis present

## 2012-12-27 DIAGNOSIS — R911 Solitary pulmonary nodule: Secondary | ICD-10-CM

## 2012-12-27 DIAGNOSIS — R0609 Other forms of dyspnea: Secondary | ICD-10-CM | POA: Diagnosis not present

## 2012-12-27 DIAGNOSIS — Z79899 Other long term (current) drug therapy: Secondary | ICD-10-CM

## 2012-12-27 DIAGNOSIS — I2699 Other pulmonary embolism without acute cor pulmonale: Secondary | ICD-10-CM | POA: Diagnosis present

## 2012-12-27 DIAGNOSIS — I471 Supraventricular tachycardia, unspecified: Secondary | ICD-10-CM

## 2012-12-27 DIAGNOSIS — D689 Coagulation defect, unspecified: Secondary | ICD-10-CM | POA: Diagnosis not present

## 2012-12-27 DIAGNOSIS — I82409 Acute embolism and thrombosis of unspecified deep veins of unspecified lower extremity: Secondary | ICD-10-CM | POA: Diagnosis present

## 2012-12-27 DIAGNOSIS — M1990 Unspecified osteoarthritis, unspecified site: Secondary | ICD-10-CM

## 2012-12-27 DIAGNOSIS — D6859 Other primary thrombophilia: Secondary | ICD-10-CM | POA: Diagnosis present

## 2012-12-27 DIAGNOSIS — J45909 Unspecified asthma, uncomplicated: Secondary | ICD-10-CM | POA: Diagnosis not present

## 2012-12-27 DIAGNOSIS — R609 Edema, unspecified: Secondary | ICD-10-CM | POA: Diagnosis present

## 2012-12-27 DIAGNOSIS — Z87891 Personal history of nicotine dependence: Secondary | ICD-10-CM | POA: Diagnosis not present

## 2012-12-27 DIAGNOSIS — R05 Cough: Secondary | ICD-10-CM | POA: Diagnosis not present

## 2012-12-27 DIAGNOSIS — R918 Other nonspecific abnormal finding of lung field: Secondary | ICD-10-CM

## 2012-12-27 DIAGNOSIS — J984 Other disorders of lung: Secondary | ICD-10-CM | POA: Diagnosis not present

## 2012-12-27 DIAGNOSIS — R7309 Other abnormal glucose: Secondary | ICD-10-CM | POA: Diagnosis present

## 2012-12-27 DIAGNOSIS — B370 Candidal stomatitis: Secondary | ICD-10-CM | POA: Diagnosis not present

## 2012-12-27 DIAGNOSIS — J189 Pneumonia, unspecified organism: Secondary | ICD-10-CM | POA: Diagnosis not present

## 2012-12-27 DIAGNOSIS — R059 Cough, unspecified: Secondary | ICD-10-CM | POA: Diagnosis not present

## 2012-12-27 DIAGNOSIS — Z4682 Encounter for fitting and adjustment of non-vascular catheter: Secondary | ICD-10-CM | POA: Diagnosis not present

## 2012-12-27 DIAGNOSIS — J9600 Acute respiratory failure, unspecified whether with hypoxia or hypercapnia: Secondary | ICD-10-CM | POA: Diagnosis present

## 2012-12-27 DIAGNOSIS — T7840XA Allergy, unspecified, initial encounter: Secondary | ICD-10-CM

## 2012-12-27 DIAGNOSIS — R809 Proteinuria, unspecified: Secondary | ICD-10-CM | POA: Diagnosis present

## 2012-12-27 DIAGNOSIS — I499 Cardiac arrhythmia, unspecified: Secondary | ICD-10-CM

## 2012-12-27 DIAGNOSIS — R739 Hyperglycemia, unspecified: Secondary | ICD-10-CM

## 2012-12-27 DIAGNOSIS — I82403 Acute embolism and thrombosis of unspecified deep veins of lower extremity, bilateral: Secondary | ICD-10-CM

## 2012-12-27 DIAGNOSIS — D649 Anemia, unspecified: Secondary | ICD-10-CM | POA: Diagnosis present

## 2012-12-27 DIAGNOSIS — E876 Hypokalemia: Secondary | ICD-10-CM

## 2012-12-27 DIAGNOSIS — R339 Retention of urine, unspecified: Secondary | ICD-10-CM | POA: Diagnosis present

## 2012-12-27 DIAGNOSIS — R112 Nausea with vomiting, unspecified: Secondary | ICD-10-CM

## 2012-12-27 DIAGNOSIS — J988 Other specified respiratory disorders: Secondary | ICD-10-CM | POA: Diagnosis not present

## 2012-12-27 DIAGNOSIS — J9601 Acute respiratory failure with hypoxia: Secondary | ICD-10-CM

## 2012-12-27 DIAGNOSIS — R0602 Shortness of breath: Secondary | ICD-10-CM | POA: Diagnosis not present

## 2012-12-27 DIAGNOSIS — C3410 Malignant neoplasm of upper lobe, unspecified bronchus or lung: Secondary | ICD-10-CM | POA: Diagnosis not present

## 2012-12-27 DIAGNOSIS — I4719 Other supraventricular tachycardia: Secondary | ICD-10-CM

## 2012-12-27 DIAGNOSIS — R0603 Acute respiratory distress: Secondary | ICD-10-CM

## 2012-12-27 DIAGNOSIS — Z9889 Other specified postprocedural states: Secondary | ICD-10-CM

## 2012-12-27 DIAGNOSIS — I82509 Chronic embolism and thrombosis of unspecified deep veins of unspecified lower extremity: Secondary | ICD-10-CM | POA: Diagnosis not present

## 2012-12-27 DIAGNOSIS — Z9221 Personal history of antineoplastic chemotherapy: Secondary | ICD-10-CM

## 2012-12-27 HISTORY — DX: Other pulmonary embolism without acute cor pulmonale: I26.99

## 2012-12-27 LAB — BLOOD GAS, ARTERIAL
Acid-Base Excess: 1.1 mmol/L (ref 0.0–2.0)
Acid-Base Excess: 1.1 mmol/L (ref 0.0–2.0)
Drawn by: 295031
O2 Content: 2 L/min
O2 Saturation: 88.8 %
TCO2: 21.4 mmol/L (ref 0–100)
pCO2 arterial: 34.5 mmHg — ABNORMAL LOW (ref 35.0–45.0)
pCO2 arterial: 35.3 mmHg (ref 35.0–45.0)
pH, Arterial: 7.462 — ABNORMAL HIGH (ref 7.350–7.450)
pO2, Arterial: 65 mmHg — ABNORMAL LOW (ref 80.0–100.0)
pO2, Arterial: 53.2 mmHg — ABNORMAL LOW (ref 80.0–100.0)

## 2012-12-27 LAB — PROTIME-INR: Prothrombin Time: 13.8 seconds (ref 11.6–15.2)

## 2012-12-27 LAB — CBC WITH DIFFERENTIAL/PLATELET
BASO%: 0.2 % (ref 0.0–2.0)
Basophils Absolute: 0 10*3/uL (ref 0.0–0.1)
Basophils Relative: 0 % (ref 0–1)
Eosinophils Absolute: 0 10*3/uL (ref 0.0–0.7)
HCT: 25.2 % — ABNORMAL LOW (ref 36.0–46.0)
Hemoglobin: 9 g/dL — ABNORMAL LOW (ref 12.0–15.0)
LYMPH%: 5.6 % — ABNORMAL LOW (ref 14.0–49.7)
Lymphs Abs: 0.4 10*3/uL — ABNORMAL LOW (ref 0.7–4.0)
MCH: 31.6 pg (ref 25.1–34.0)
MCH: 32.2 pg (ref 26.0–34.0)
MCH: 32.6 pg (ref 26.0–34.0)
MCHC: 34.5 g/dL (ref 31.5–36.0)
MCHC: 35.7 g/dL (ref 30.0–36.0)
MCV: 91.6 fL (ref 79.5–101.0)
MCV: 92.5 fL (ref 78.0–100.0)
MCV: 91.3 fL (ref 78.0–100.0)
MONO%: 3.7 % (ref 0.0–14.0)
Monocytes Absolute: 0.3 10*3/uL (ref 0.1–1.0)
Monocytes Absolute: 0.2 10*3/uL (ref 0.1–1.0)
Monocytes Relative: 3 % (ref 3–12)
Neutrophils Relative %: 91 % — ABNORMAL HIGH (ref 43–77)
Platelets: 181 10*3/uL (ref 145–400)
Platelets: 173 10*3/uL (ref 150–400)
RBC: 3.2 10*6/uL — ABNORMAL LOW (ref 3.70–5.45)
RBC: 3.07 MIL/uL — ABNORMAL LOW (ref 3.87–5.11)
RDW: 16.4 % — ABNORMAL HIGH (ref 11.5–15.5)
WBC Morphology: INCREASED
WBC: 8.6 10*3/uL (ref 4.0–10.5)
nRBC: 1 % — ABNORMAL HIGH (ref 0–0)

## 2012-12-27 LAB — BASIC METABOLIC PANEL
BUN: 10 mg/dL (ref 6–23)
Chloride: 98 mEq/L (ref 96–112)
Glucose, Bld: 153 mg/dL — ABNORMAL HIGH (ref 70–99)
Potassium: 3.6 mEq/L (ref 3.5–5.1)

## 2012-12-27 LAB — BASIC METABOLIC PANEL (CC13)
BUN: 14.7 mg/dL (ref 7.0–26.0)
CO2: 24 mEq/L (ref 22–29)
Calcium: 8.7 mg/dL (ref 8.4–10.4)
Creatinine: 1.1 mg/dL (ref 0.6–1.1)

## 2012-12-27 LAB — COMPREHENSIVE METABOLIC PANEL
Albumin: 2.3 g/dL — ABNORMAL LOW (ref 3.5–5.2)
BUN: 14 mg/dL (ref 6–23)
Calcium: 8.2 mg/dL — ABNORMAL LOW (ref 8.4–10.5)
Creatinine, Ser: 0.59 mg/dL (ref 0.50–1.10)
GFR calc Af Amer: >90 mL/min (ref >90)
Glucose, Bld: 281 mg/dL — ABNORMAL HIGH (ref 70–99)
Total Protein: 5.9 g/dL — ABNORMAL LOW (ref 6.0–8.3)

## 2012-12-27 LAB — URINALYSIS, ROUTINE W REFLEX MICROSCOPIC
Bilirubin Urine: NEGATIVE
Nitrite: NEGATIVE
Protein, ur: 100 mg/dL — AB
Specific Gravity, Urine: 1.025 (ref 1.005–1.030)
Urobilinogen, UA: 1 mg/dL (ref 0.0–1.0)

## 2012-12-27 LAB — LACTIC ACID, PLASMA: Lactic Acid, Venous: 1.6 mmol/L (ref 0.5–2.2)

## 2012-12-27 LAB — SAMPLE TO BLOOD BANK

## 2012-12-27 LAB — PROCALCITONIN: Procalcitonin: 0.62 ng/mL

## 2012-12-27 LAB — URINE MICROSCOPIC-ADD ON

## 2012-12-27 MED ORDER — INSULIN ASPART 100 UNIT/ML ~~LOC~~ SOLN
0.0000 [IU] | Freq: Three times a day (TID) | SUBCUTANEOUS | Status: DC
  Administered 2012-12-28: 2 [IU] via SUBCUTANEOUS
  Administered 2012-12-28: 1 [IU] via SUBCUTANEOUS

## 2012-12-27 MED ORDER — SODIUM CHLORIDE 0.9 % IJ SOLN: 3.0000 mL | INTRAMUSCULAR | Status: DC | PRN

## 2012-12-27 MED ORDER — ACETAMINOPHEN 325 MG PO TABS
650.0000 mg | ORAL_TABLET | Freq: Four times a day (QID) | ORAL | Status: DC | PRN
  Administered 2012-12-27 – 2012-12-28 (×3): 650 mg via ORAL
  Filled 2012-12-27 (×3): qty 2

## 2012-12-27 MED ORDER — ACETAMINOPHEN 325 MG PO TABS
ORAL_TABLET | ORAL | Status: AC
  Administered 2012-12-27: 650 mg
  Filled 2012-12-27: qty 2

## 2012-12-27 MED ORDER — FENTANYL CITRATE 0.05 MG/ML IJ SOLN
50.0000 ug | Freq: Once | INTRAMUSCULAR | Status: AC
  Administered 2012-12-27: 50 ug via INTRAVENOUS

## 2012-12-27 MED ORDER — VANCOMYCIN HCL 500 MG IV SOLR
500.0000 mg | Freq: Two times a day (BID) | INTRAVENOUS | Status: DC
  Administered 2012-12-28 – 2012-12-30 (×5): 500 mg via INTRAVENOUS
  Filled 2012-12-27 (×6): qty 500

## 2012-12-27 MED ORDER — ACETAMINOPHEN 650 MG RE SUPP: 650.0000 mg | Freq: Four times a day (QID) | RECTAL | Status: DC | PRN

## 2012-12-27 MED ORDER — FENTANYL CITRATE 0.05 MG/ML IJ SOLN
INTRAMUSCULAR | Status: AC
  Filled 2012-12-27: qty 2

## 2012-12-27 MED ORDER — SODIUM CHLORIDE 0.9 % IV SOLN
1000.0000 mL | Freq: Once | INTRAVENOUS | Status: AC
  Administered 2012-12-27: 1000 mL via INTRAVENOUS

## 2012-12-27 MED ORDER — LIDOCAINE HCL (PF) 1 % IJ SOLN
INTRAMUSCULAR | Status: AC
  Filled 2012-12-27: qty 30

## 2012-12-27 MED ORDER — VANCOMYCIN HCL 1000 MG IV SOLR
750.0000 mg | Freq: Once | INTRAVENOUS | Status: AC
  Administered 2012-12-27: 750 mg via INTRAVENOUS
  Filled 2012-12-27: qty 750

## 2012-12-27 MED ORDER — ALBUTEROL SULFATE (5 MG/ML) 0.5% IN NEBU: 2.5000 mg | INHALATION_SOLUTION | RESPIRATORY_TRACT | Status: DC | PRN

## 2012-12-27 MED ORDER — PIPERACILLIN-TAZOBACTAM 3.375 G IVPB
3.3750 g | Freq: Three times a day (TID) | INTRAVENOUS | Status: DC
  Administered 2012-12-27 – 2013-01-04 (×22): 3.375 g via INTRAVENOUS
  Filled 2012-12-27 (×27): qty 50

## 2012-12-27 MED ORDER — METOPROLOL SUCCINATE ER 50 MG PO TB24
50.0000 mg | ORAL_TABLET | Freq: Every day | ORAL | Status: DC
  Administered 2012-12-28 – 2012-12-29 (×2): 50 mg via ORAL
  Filled 2012-12-27 (×4): qty 1

## 2012-12-27 MED ORDER — MIDAZOLAM HCL 2 MG/2ML IJ SOLN
INTRAMUSCULAR | Status: AC
  Filled 2012-12-27: qty 2

## 2012-12-27 MED ORDER — LEVOFLOXACIN IN D5W 750 MG/150ML IV SOLN
750.0000 mg | Freq: Once | INTRAVENOUS | Status: AC
  Administered 2012-12-27: 750 mg via INTRAVENOUS
  Filled 2012-12-27: qty 150

## 2012-12-27 MED ORDER — FLUCONAZOLE 200 MG PO TABS
200.0000 mg | ORAL_TABLET | Freq: Every day | ORAL | Status: DC
  Administered 2012-12-27 – 2012-12-29 (×3): 200 mg via ORAL
  Filled 2012-12-27 (×4): qty 1

## 2012-12-27 MED ORDER — SODIUM CHLORIDE 0.9 % IV SOLN
INTRAVENOUS | Status: DC
  Administered 2012-12-27: 19:00:00 via INTRAVENOUS
  Administered 2012-12-28: 75 mL/h via INTRAVENOUS

## 2012-12-27 MED ORDER — INSULIN ASPART 100 UNIT/ML ~~LOC~~ SOLN
0.0000 [IU] | Freq: Every day | SUBCUTANEOUS | Status: DC
  Administered 2012-12-29: 2 [IU] via SUBCUTANEOUS

## 2012-12-27 MED ORDER — IOHEXOL 350 MG/ML SOLN
100.0000 mL | Freq: Once | INTRAVENOUS | Status: AC | PRN
  Administered 2012-12-27: 100 mL via INTRAVENOUS

## 2012-12-27 MED ORDER — ENOXAPARIN SODIUM 80 MG/0.8ML ~~LOC~~ SOLN
70.0000 mg | Freq: Two times a day (BID) | SUBCUTANEOUS | Status: DC
  Administered 2012-12-27 – 2013-01-05 (×18): 70 mg via SUBCUTANEOUS
  Filled 2012-12-27 (×20): qty 0.8

## 2012-12-27 MED ORDER — ONDANSETRON HCL 4 MG/2ML IJ SOLN
4.0000 mg | Freq: Four times a day (QID) | INTRAMUSCULAR | Status: DC | PRN
  Administered 2012-12-30 – 2013-01-02 (×3): 4 mg via INTRAVENOUS
  Filled 2012-12-27 (×3): qty 2

## 2012-12-27 MED ORDER — MIDAZOLAM HCL 2 MG/2ML IJ SOLN
1.0000 mg | Freq: Once | INTRAMUSCULAR | Status: AC
  Administered 2012-12-27: 1 mg via INTRAVENOUS

## 2012-12-27 MED ORDER — SODIUM CHLORIDE 0.9 % IJ SOLN: 3.0000 mL | Freq: Two times a day (BID) | INTRAMUSCULAR | Status: DC

## 2012-12-27 MED ORDER — OXYCODONE-ACETAMINOPHEN 5-325 MG PO TABS: 1.0000 | ORAL_TABLET | ORAL | Status: DC | PRN

## 2012-12-27 MED ORDER — SODIUM CHLORIDE 0.9 % IJ SOLN
3.0000 mL | Freq: Two times a day (BID) | INTRAMUSCULAR | Status: DC
  Administered 2012-12-27: 3 mL via INTRAVENOUS

## 2012-12-27 MED ORDER — PIPERACILLIN-TAZOBACTAM 3.375 G IVPB
3.3750 g | Freq: Once | INTRAVENOUS | Status: AC
  Administered 2012-12-28: 3.375 g via INTRAVENOUS
  Filled 2012-12-27: qty 50

## 2012-12-27 MED ORDER — VALACYCLOVIR HCL 500 MG PO TABS
500.0000 mg | ORAL_TABLET | Freq: Three times a day (TID) | ORAL | Status: DC
  Administered 2012-12-28 – 2013-01-03 (×20): 500 mg via ORAL
  Filled 2012-12-27 (×26): qty 1

## 2012-12-27 MED ORDER — ONDANSETRON HCL 4 MG PO TABS: 4.0000 mg | ORAL_TABLET | Freq: Four times a day (QID) | ORAL | Status: DC | PRN

## 2012-12-27 MED ORDER — SODIUM CHLORIDE 0.9 % IJ SOLN
10.0000 mL | INTRAMUSCULAR | Status: DC | PRN
  Administered 2012-12-27: 10 mL

## 2012-12-27 MED ORDER — SODIUM CHLORIDE 0.9 % IV SOLN
1000.0000 mL | INTRAVENOUS | Status: DC
Start: 2012-12-27 — End: 2012-12-27

## 2012-12-27 MED ORDER — IOHEXOL 300 MG/ML  SOLN
100.0000 mL | Freq: Once | INTRAMUSCULAR | Status: AC | PRN
  Administered 2012-12-27: 40 mL via INTRAVENOUS

## 2012-12-27 MED ORDER — SODIUM CHLORIDE 0.9 % IV SOLN
250.0000 mL | INTRAVENOUS | Status: DC | PRN
  Administered 2012-12-30: 250 mL via INTRAVENOUS
  Administered 2013-01-02: 1000 mL via INTRAVENOUS
  Administered 2013-01-03: 250 mL via INTRAVENOUS
  Administered 2013-01-04: 20 mL/h via INTRAVENOUS

## 2012-12-27 MED ORDER — PANTOPRAZOLE SODIUM 40 MG PO TBEC
40.0000 mg | DELAYED_RELEASE_TABLET | Freq: Every day | ORAL | Status: DC
  Administered 2012-12-28 – 2012-12-29 (×2): 40 mg via ORAL
  Filled 2012-12-27 (×4): qty 1

## 2012-12-27 MED ORDER — PIPERACILLIN-TAZOBACTAM 3.375 G IVPB
3.3750 g | Freq: Three times a day (TID) | INTRAVENOUS | Status: DC
  Filled 2012-12-27: qty 50

## 2012-12-27 MED ORDER — SODIUM CHLORIDE 0.9 % IV SOLN: 1000.0000 mL | INTRAVENOUS | Status: DC

## 2012-12-27 NOTE — ED Notes (Signed)
Patient transported to X-ray 

## 2012-12-27 NOTE — Progress Notes (Signed)
[facility]  Telephone:(336) [phone] Fax:(336) [phone]   OFFICE PROGRESS NOTE   Cc:  [name], MD [address]  [place] [address]   DIAGNOSIS: 67 year old female with new diagnosis of inflammatory breast cancer   PRIOR THERAPY:  #1 patient was originally seen inclinic for new diagnosis of inflammatory breast cancer she was referred by Dr. Emelia Loron. She had a mammogram performed That showed an abnormality. That was biopsied and it showed an invasive mammary carcinoma with lymphovascular invasion grade 3 ER negative PR negative HER-2/neu positive.   #2 patient has gone on to have MRI of the breasts performed the MRI shows diffuse right breast neoplasm on a large level I right axillary lymph nodes compatible with lymphatic spread.Patient has had a biopsy of the right axillary lymph node that is compatible with invasive mammary carcinoma.   #3 patient began neoadjuvant FEC 100 with day 2 Neulasta on 07/23/2012   #4 Weekly neoadjuvant Taxol and Herceptin starting 09/17/12, patient developed neuropathies, and taxol was discontinued early and patient received 2 weeks of herceptin only. She completed 7 weeks of Taxol/Herceptin combination therapy.   #5 Weekly Gemcitabine and Herceptin starting on 11/19/12   CURRENT THERAPY: Herceptin  INTERVAL HISTORY:   Ms. Smyser was here today for a brief period.  She had an ultrasound of her lower extremities which were positive for a DVT in both extremities.  She underwent hypercoagulable panel tesgting, and started on Lovenox 70mg  q12h on Friday, 12/24/12.   This morning while she was in our office after walking from getting her vital signs, her color turned gray, she became tachypneic at 40 breaths per minute, tachycardic ranging from 130-140 and hypoxic with her oxygen saturation at 70% on room air.     Past Medical History  Diagnosis Date  . Asthma   . GERD (gastroesophageal reflux disease)   .  Dysrhythmia     PAT-sees dr Laurence Compton meds  . Breast cancer 07/14/12    inflammatory right breast ca, ER/PR -  . Allergy   . PAT (paroxysmal atrial tachycardia)     hx  . Arthritis     osteopenia,knees  . PONV (postoperative nausea and vomiting) 09-13-12    severe, with Port-a-cath, was managed without PONV  . Clotting disorder   . History of chemotherapy   . DVT (deep venous thrombosis)     Past Surgical History  Procedure Date  . Dilation and curettage of uterus     x3  . Tonsillectomy   . Colonoscopy   . Portacath placement 07/21/2012    Procedure: INSERTION PORT-A-CATH;  Surgeon: Emelia Loron, MD;  Location: [facility];  Service: General;  Laterality: Left;    No current facility-administered medications for this visit.   Current Outpatient Prescriptions  Medication Sig Dispense Refill  . albuterol (PROVENTIL HFA;VENTOLIN HFA) 108 (90 BASE) MCG/ACT inhaler Inhale 2 puffs into the lungs every 6 (six) hours as needed.      . Alum & Mag Hydroxide-Simeth (MAGIC MOUTHWASH W/LIDOCAINE) SOLN Take 5 mLs by mouth 4 (four) times daily as needed.  60 mL  0  . b complex vitamins tablet Take 1 tablet by mouth daily.      . calcium-vitamin D (OSCAL WITH D) 500-200 MG-UNIT per tablet Take 1 tablet by mouth daily.      Marland Kitchen enoxaparin (LOVENOX) 80 MG/0.8ML injection Inject 0.7 mLs (70 mg total) into the skin every 12 (twelve) hours.  60 Syringe  3  .  fish oil-omega-3 fatty acids 1000 MG capsule Take 2 g by mouth daily.      . Flaxseed, Linseed, (FLAX SEEDS PO) Take 1 tablet by mouth daily.      . fluconazole (DIFLUCAN) 200 MG tablet Take 1 tablet (200 mg total) by mouth daily.  8 tablet  0  . fluticasone (FLONASE) 50 MCG/ACT nasal spray Place 1 spray into the nose daily as needed. For allergies      . glucosamine-chondroitin 500-400 MG tablet Take 1 tablet by mouth daily.      Marland Kitchen lidocaine-prilocaine (EMLA) cream Apply topically as needed.  30 g  5  . metoprolol succinate  (TOPROL XL) 50 MG 24 hr tablet Take 1 tablet (50 mg total) by mouth daily. Take with or immediately following a meal.  30 tablet  3  . Multiple Vitamin (MULTIVITAMIN WITH MINERALS) TABS Take 1 tablet by mouth daily.      Marland Kitchen omeprazole (PRILOSEC) 20 MG capsule Take 20 mg by mouth daily as needed. For heartburn      . ondansetron (ZOFRAN) 8 MG tablet       . oxyCODONE-acetaminophen (ROXICET) 5-325 MG per tablet Take 1 tablet by mouth every 4 (four) hours as needed for pain.  15 tablet  0  . valACYclovir (VALTREX) 500 MG tablet Take 1 tablet (500 mg total) by mouth 3 (three) times daily.  90 tablet  6   Facility-Administered Medications Ordered in Other Visits  Medication Dose Route Frequency Provider Last Rate Last Dose  . acetaminophen (TYLENOL) 325 MG tablet             ALLERGIES:  is allergic to anesthetics, amide; bactrim; sulfa antibiotics; codeine; codeine phosphate; and eggs or egg-derived products.   Filed Vitals:   12/27/12 1109  BP: 118/78  Pulse: 139  Temp: 99.7 F (37.6 C)  Resp: 22   Wt Readings from Last 3 Encounters:  12/27/12 148 lb (67.132 kg)  12/22/12 146 lb 8 oz (66.452 kg)  12/17/12 145 lb 9.6 oz (66.044 kg)     PHYSICAL EXAMINATION:  BP 118/78  Pulse 139  Temp 99.7 F (37.6 C) (Oral)  Resp 22  Ht 5\' 1"  (1.549 m)  Wt 148 lb (67.132 kg)  BMI 27.96 kg/m2  SpO2 75%    LABORATORY/RADIOLOGY DATA:  Lab Results  Component Value Date   WBC 9.5 12/27/2012   HGB 10.1* 12/27/2012   HCT 29.3* 12/27/2012   PLT 181 12/27/2012   GLUCOSE 395* 12/17/2012   ALKPHOS 64 12/17/2012   ALT 54 12/17/2012   AST 28 12/17/2012   NA 133* 12/17/2012   K 4.3 12/17/2012   CL 98 12/17/2012   CREATININE 1.0 12/17/2012   BUN 23.0 12/17/2012   CO2 23 12/17/2012   INR 1.06 09/15/2012     ASSESSMENT AND PLAN:   1. Klarisa Barman 67 y.o. female with   #1 inflammatory right breast cancer with positive lymph nodes the tumor is ER negative PR negative HER-2/neu positive. Patient is s/p  neoadjuvant chemotherapy intially Consisting of FEC q 2 weeks x 4 cycles. herceptin q week x 12 weeks is planned, beginning on 01/14/2013.  #2 Tachycardia   #3 Bilateral DVT  #4 Acute Dyspnea  PLAN:   #1 In finding Ms. Chien's condition we emergently got the patient oxygen, accessed her port, hung a 1L bag of IV NS, and transported her to the emergency room.  Her oxygen saturation did recover to 92%.  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    All questions were answered. The patient knows to call the clinic with any problems, questions or concerns. We can certainly see the patient much sooner if necessary.  I spent 25 minutes counseling the patient face to face. The total time spent in the appointment was 30 minutes.  This case was reviewed with Dr. Welton Flakes.   Cherie Ouch Lyn Hollingshead, NP Medical Oncology [facility] Phone: [phone]

## 2012-12-27 NOTE — Progress Notes (Signed)
Agree with PA note.    Signed,  Heath K. McCullough, MD Vascular & Interventional Radiologist [facility]  

## 2012-12-27 NOTE — ED Provider Notes (Addendum)
History     CSN: [id number]  Arrival date & time 12/27/12  1113   First MD Initiated Contact with Patient 12/27/12 1119      Chief Complaint  Patient presents with  . Shortness of Breath    acute PE    (Consider location/radiation/quality/duration/timing/severity/associated sxs/prior treatment) HPI Comments: Mrs. Guymon presents with her husband for evaluation.  She has a history of breast cancer and recently completed a course of chemotherapy.  She is pending mastectomy.  She developed leg pain las week and was diagnosed with DVTs.  She is currently using lovenox.  She denies ongoing leg pain but reports feeling acutely ill over the last 24 hours.  The symptoms include SOB, exertional SOB, chest discomfort with deep inspiration, fatigue, and fever.  She denies rhinorrhea, sore throat, and cough.  She was sent to the ER from the oncology offices today.  Patient is a 67 y.o. female presenting with shortness of breath. The history is provided by the patient. No language interpreter was used.  Shortness of Breath  The current episode started today. The onset was gradual. The problem occurs continuously. The problem has been gradually worsening. The problem is moderate. Nothing relieves the symptoms. The symptoms are aggravated by activity. Associated symptoms include chest pain (with deep breaths), a fever and shortness of breath. Pertinent negatives include no chest pressure, no orthopnea, no rhinorrhea, no sore throat, no stridor, no cough and no wheezing. She was not exposed to toxic fumes. She has not inhaled smoke recently. She has had prior hospitalizations. She has had no prior ICU admissions. She has had no prior intubations. Her past medical history is significant for asthma. Urine output has been normal. The last void occurred less than 6 hours ago. Recently, medical care has been given by a specialist. Services received include tests performed, medications given and one or more referrals.     Past Medical History  Diagnosis Date  . Asthma   . GERD (gastroesophageal reflux disease)   . Dysrhythmia     PAT-sees dr Laurence Compton meds  . Breast cancer 07/14/12    inflammatory right breast ca, ER/PR -  . Allergy   . PAT (paroxysmal atrial tachycardia)     hx  . Arthritis     osteopenia,knees  . PONV (postoperative nausea and vomiting) 09-13-12    severe, with Port-a-cath, was managed without PONV  . Clotting disorder   . History of chemotherapy   . DVT (deep venous thrombosis)     Past Surgical History  Procedure Date  . Dilation and curettage of uterus     x3  . Tonsillectomy   . Colonoscopy   . Portacath placement 07/21/2012    Procedure: INSERTION PORT-A-CATH;  Surgeon: Emelia Loron, MD;  Location: [facility];  Service: General;  Laterality: Left;    Family History  Problem Relation Age of Onset  . Diabetes Mother   . Heart disease Mother   . Hypertension Mother   . Hyperlipidemia Mother   . Breast cancer Mother 8    lobular breast cancer  . Heart disease Maternal Aunt   . Heart disease Maternal Uncle   . Heart disease Maternal Grandmother     died in her 19s from heart disease  . Lung cancer Father 46    adenocarcinoma  . Cancer Paternal Uncle     dx in mid 33s with a cancer in the leg, died late 94s; grandpaternal half uncle    History  Substance  Use Topics  . Smoking status: Former Smoker -- 35 years    Quit date: 07/21/1979  . Smokeless tobacco: Never Used  . Alcohol Use: No    OB History    Grav Para Term Preterm Abortions TAB SAB Ect Mult Living   1 1              Review of Systems  Unable to perform ROS Constitutional: Positive for fever, chills, appetite change and fatigue. Negative for diaphoresis.  HENT: Negative for congestion, sore throat and rhinorrhea.   Eyes: Negative for visual disturbance.  Respiratory: Positive for shortness of breath. Negative for cough, chest tightness, wheezing and stridor.     Cardiovascular: Positive for chest pain (with deep breaths). Negative for palpitations and orthopnea.  Gastrointestinal: Negative for nausea, vomiting and diarrhea.  Genitourinary: Negative for dysuria, urgency and difficulty urinating.  Musculoskeletal: Negative for myalgias, back pain and arthralgias.  Skin: Positive for pallor. Negative for rash and wound.  Neurological: Negative for syncope, light-headedness and headaches.  Psychiatric/Behavioral: Negative for dysphoric mood. The patient is not nervous/anxious.     Allergies  Anesthetics, amide; Bactrim; Sulfa antibiotics; Codeine; Codeine phosphate; and Eggs or egg-derived products  Home Medications   Current Outpatient Rx  Name  Route  Sig  Dispense  Refill  . ALBUTEROL SULFATE HFA 108 (90 BASE) MCG/ACT IN AERS   Inhalation   Inhale 2 puffs into the lungs every 6 (six) hours as needed.         Marland Kitchen MAGIC MOUTHWASH W/LIDOCAINE   Oral   Take 5 mLs by mouth 4 (four) times daily as needed.   60 mL   0   . B COMPLEX PO TABS   Oral   Take 1 tablet by mouth daily.         Marland Kitchen CALCIUM CARBONATE-VITAMIN D 500-200 MG-UNIT PO TABS   Oral   Take 1 tablet by mouth daily.         Marland Kitchen ENOXAPARIN SODIUM 80 MG/0.8ML [place] SOLN   Subcutaneous   Inject 0.7 mLs (70 mg total) into the skin every 12 (twelve) hours.   60 Syringe   3     Pt weight is 66.5kg, 1mg /kg q12h dosing   . OMEGA-3 FATTY ACIDS 1000 MG PO CAPS   Oral   Take 2 g by mouth daily.         Marland Kitchen FLAX SEEDS PO   Oral   Take 1 tablet by mouth daily.         Marland Kitchen FLUCONAZOLE 200 MG PO TABS   Oral   Take 1 tablet (200 mg total) by mouth daily.   8 tablet   0   . FLUTICASONE PROPIONATE 50 MCG/ACT NA SUSP   Nasal   Place 1 spray into the nose daily as needed. For allergies         . GLUCOSAMINE-CHONDROITIN 500-400 MG PO TABS   Oral   Take 1 tablet by mouth daily.         Marland Kitchen LIDOCAINE-PRILOCAINE 2.5-2.5 % EX CREA   Topical   Apply topically as needed.   30  g   5   . METOPROLOL SUCCINATE ER 50 MG PO TB24   Oral   Take 1 tablet (50 mg total) by mouth daily. Take with or immediately following a meal.   30 tablet   3   . ADULT MULTIVITAMIN W/MINERALS CH   Oral   Take 1 tablet by mouth daily.         Marland Kitchen  OMEPRAZOLE 20 MG PO CPDR   Oral   Take 20 mg by mouth daily as needed. For heartburn         . ONDANSETRON HCL 8 MG PO TABS               . OXYCODONE-ACETAMINOPHEN 5-325 MG PO TABS   Oral   Take 1 tablet by mouth every 4 (four) hours as needed for pain.   15 tablet   0   . VALACYCLOVIR HCL 500 MG PO TABS   Oral   Take 1 tablet (500 mg total) by mouth 3 (three) times daily.   90 tablet   6     BP 131/73  Pulse 128  Temp 101.5 F (38.6 C) (Oral)  Resp 34  SpO2 93%  Physical Exam  Vitals reviewed. Constitutional: She is oriented to person, place, and time. She appears well-developed and well-nourished. She is cooperative. She has a sickly appearance. She appears ill. No distress. She is not intubated.  HENT:  Head: Normocephalic and atraumatic. Head is without raccoon's eyes, without Battle's sign, without contusion, without right periorbital erythema and without left periorbital erythema. No trismus in the jaw.  Right Ear: External ear normal.  Left Ear: External ear normal.  Nose: No rhinorrhea or sinus tenderness. No epistaxis. Right sinus exhibits no maxillary sinus tenderness and no frontal sinus tenderness. Left sinus exhibits no maxillary sinus tenderness and no frontal sinus tenderness.  Mouth/Throat: Uvula is midline. Mucous membranes are pale, dry and not cyanotic. No dental abscesses or uvula swelling. No oropharyngeal exudate, posterior oropharyngeal edema, posterior oropharyngeal erythema or tonsillar abscesses.  Eyes: Lids are normal. Pupils are equal, round, and reactive to light. Right eye exhibits no discharge and no exudate. Left eye exhibits no discharge and no exudate. Right conjunctiva is not  injected. Right conjunctiva has no hemorrhage. Left conjunctiva is not injected. Left conjunctiva has no hemorrhage. No scleral icterus. Right eye exhibits normal extraocular motion and no nystagmus. Left eye exhibits no nystagmus. Right pupil is round and reactive. Left pupil is round and reactive. Pupils are equal.       Pale conjucntiva  Neck: Normal range of motion. Neck supple. No JVD present. No tracheal deviation present.  Cardiovascular: Regular rhythm, S1 normal, S2 normal, normal heart sounds, intact distal pulses and normal pulses.   No extrasystoles are present. Tachycardia present.  PMI is not displaced.  Exam reveals no gallop and no decreased pulses.   No murmur heard. Pulmonary/Chest: Accessory muscle usage present. No stridor. Tachypnea noted. No apnea and not bradypneic. She is not intubated. She has decreased breath sounds in the right lower field and the left lower field. She has rhonchi. She has no rales. She exhibits no tenderness, no bony tenderness, no edema, no deformity and no retraction.  Abdominal: Soft. Bowel sounds are normal. She exhibits no distension and no mass. There is no tenderness. There is no rebound and no guarding.  Musculoskeletal: Normal range of motion. She exhibits edema (trace). She exhibits no tenderness.  Lymphadenopathy:    She has no cervical adenopathy.  Neurological: She is alert and oriented to person, place, and time. No cranial nerve deficit.  Skin: Skin is warm and dry. No rash noted. No erythema. No pallor.  Psychiatric: She has a normal mood and affect. Her behavior is normal.    ED Course  Procedures (including critical care time)  Labs Reviewed  BLOOD GAS, ARTERIAL - Abnormal; Notable for the following:  pH, Arterial 7.462 (*)     pCO2 arterial 34.5 (*)     pO2, Arterial 65.0 (*)     Bicarbonate 24.3 (*)     All other components within normal limits  CBC WITH DIFFERENTIAL - Abnormal; Notable for the following:    RBC 3.07 (*)      Hemoglobin 9.9 (*)     HCT 28.4 (*)     RDW 16.4 (*)     All other components within normal limits  COMPREHENSIVE METABOLIC PANEL - Abnormal; Notable for the following:    Sodium 124 (*)     Chloride 90 (*)     Glucose, Bld 281 (*)     Calcium 8.2 (*)     Total Protein 5.9 (*)     Albumin 2.3 (*)     AST 39 (*)     ALT 36 (*)     All other components within normal limits  LACTIC ACID, PLASMA  PROTIME-INR  URINALYSIS, ROUTINE W REFLEX MICROSCOPIC  URINE CULTURE  CULTURE, BLOOD (ROUTINE X 2)  CULTURE, BLOOD (ROUTINE X 2)  PROCALCITONIN  SAMPLE TO BLOOD BANK   Dg Chest 2 View  12/27/2012  *RADIOLOGY REPORT*  Clinical Data: Fever with shortness of breath.  History of breast cancer.  CHEST - 2 VIEW  Comparison: 09/15/2012.  Findings: Left subclavian Port-A-Cath tip is unchanged at the SVC right atrial junction.  The heart size and mediastinal contours are stable.  There are new nodular air space opacities in both lungs, right greater than left.  There is no significant pleural effusion. The osseous structures appear unremarkable.  IMPRESSION: New bilateral air space opacities, right greater than left.  In the setting, these likely represent pneumonia.  Radiographic followup is necessary to exclude metastatic disease.   Original Report Authenticated By: Carey Bullocks, M.D.      No diagnosis found.    MDM  Pt, with known hx of breast CA and recent new onset DVTs, presents for evaluation of chest discomfort, fever, and shortness of breath.  She appears acutely ill.  Note fever, tachycardia, tachypnea, and a depressed O2 sat.  She has been taking lovenox for treatment of the DVTs.  Will obtain a stat bedside echocardiogram to assess for evidence of acute strain or right-sided heart failure.  Will also obtain sepsis protocol labs and cultures.  She has dry mucous membranes and appears dehydrated.  Will bolus IVF and administer vancomycin and zosyn as empirical coverage.    1300.  ABG  demonstrates a mild respiratory alkalosis with a mildly depressed O2 sat.  She has no leukocytosis/leukocytopenia, but is significantly anemic.  This is not acute and the H&H are consistent with recently obtained values.  There is a mild hypochloremia and some significant hyponatremia.  These are not acute values either.  The chest x-ray demonstrates new diffuse bilateral opacities (R >L).  In the setting of acute hypoxemia, fever, and pleuritic chest pain, this likely represents an acute pneumonia.  Will add order levaquin also as an antipseudomonal agent.  The results of the echocardiogram are still pending.  Placed a consult to the hospitalist service for admission.  Will continue to monitor closely.  1325.  Tachycardia has improved.  O2 sat remains above 92%.  RR is still 28-35.  Discussed her evaluation with Dr. Arbutus Leas (hospitalist).  She will be performing a bedside evaluation.  Ordered a CTA of the chest to assess for PE as well as for better imaging of the lungs.  1455.  Discussed her evaluation with Dr. Elmon Kirschner (radiologist).  She has bilateral pulmonary emboli but he reports a very small clot burden.  There is diffuse lung opacity more consistent with pneumonia than pulmonary infarction.  Pt has admission orders.   CRITICAL CARE Performed by: Dana Allan T   Total critical care time: 45  Critical care time was exclusive of separately billable procedures and treating other patients.  Critical care was necessary to treat or prevent imminent or life-threatening deterioration.  Critical care was time spent personally by me on the following activities: development of treatment plan with patient and/or surrogate as well as nursing, discussions with consultants, evaluation of patient's response to treatment, examination of patient, obtaining history from patient or surrogate, ordering and performing treatments and interventions, ordering and review of laboratory studies, ordering and review of  radiographic studies, pulse oximetry and re-evaluation of patient's condition.      Tobin Chad, MD 12/27/12 1341  Tobin Chad, MD 12/27/12 1459

## 2012-12-27 NOTE — ED Notes (Signed)
Went in to check on pt d/t SVT on monitor reported by Engineer, site.  Pt is A&O x 4-reporting palpitations but denies any cp or SOB or lightheadedness at this time.

## 2012-12-27 NOTE — Progress Notes (Signed)
Patient ID: Jessica Wells, female   DOB: [birth year], 67 y.o.   MRN: [id number] Request received for IVC filter placement on pt with history of breast cancer, prothrombin gene mutation, recent diagnosis of bilateral DVT 12/24/2012 treated with lovenox and acute bilateral PE diagnosed today. Additional PMH as below. Case d/w Dr. Archer Asa (IR).  Exam: pt awake/alert; chest- few exp wheezes; crackles on right; left chest PAC ; heart -RRR ; abd- soft,+BS,NT; ext- FROM, mild edema.    Filed Vitals:   12/27/12 1125 12/27/12 1400  BP: 131/73 110/62  Pulse: 128 85  Temp: 101.5 F (38.6 C)   TempSrc: Oral   Resp: 34   SpO2: 93% 93%   Past Medical History  Diagnosis Date  . Asthma   . GERD (gastroesophageal reflux disease)   . Dysrhythmia     PAT-sees dr Laurence Compton meds  . Breast cancer 07/14/12    inflammatory right breast ca, ER/PR -  . Allergy   . PAT (paroxysmal atrial tachycardia)     hx  . Arthritis     osteopenia,knees  . PONV (postoperative nausea and vomiting) 09-13-12    severe, with Port-a-cath, was managed without PONV  . Clotting disorder   . History of chemotherapy   . DVT (deep venous thrombosis)    Past Surgical History  Procedure Date  . Dilation and curettage of uterus     x3  . Tonsillectomy   . Colonoscopy   . Portacath placement 07/21/2012    Procedure: INSERTION PORT-A-CATH;  Surgeon: Emelia Loron, MD;  Location: [facility];  Service: General;  Laterality: Left;  Dg Chest 2 View  12/27/2012  *RADIOLOGY REPORT*  Clinical Data: Fever with shortness of breath.  History of breast cancer.  CHEST - 2 VIEW  Comparison: 09/15/2012.  Findings: Left subclavian Port-A-Cath tip is unchanged at the SVC right atrial junction.  The heart size and mediastinal contours are stable.  There are new nodular air space opacities in both lungs, right greater than left.  There is no significant pleural effusion. The osseous structures appear unremarkable.  IMPRESSION: New  bilateral air space opacities, right greater than left.  In the setting, these likely represent pneumonia.  Radiographic followup is necessary to exclude metastatic disease.   Original Report Authenticated By: Carey Bullocks, M.D.    Ct Angio Chest Pe W/cm &/or Wo Cm  12/27/2012  *RADIOLOGY REPORT*  Clinical Data: Pleuritic chest pain, hypoxemia, tachycardia, history of breast cancer with new diagnosis of DVT, evaluate for pulmonary embolism  CT ANGIOGRAPHY CHEST  Technique:  Multidetector CT imaging of the chest using the standard protocol during bolus administration of intravenous contrast. Multiplanar reconstructed images including MIPs were obtained and reviewed to evaluate the vascular anatomy.  Contrast: OMNIPAQUE IOHEXOL 350 MG/ML SOLN  Comparison: CT chest, abdomen pelvis - 07/26/2012; PET CT - 07/26/2012; chest radiograph - earlier same day  Vascular Findings:  There is suboptimal opacification of the pulmonary arterial system of the main pulmonary artery measuring only 217 HU.  Given this limitation, there is are discrete filling defects within the peripheral aspect of the right main pulmonary artery (image 114, series 5) with nonocclusive thrombus extending into nearly all major segmental branches of the right lung.  There is a there is nonocclusive thrombus within the left anterior/medial basilar segmental pulmonary artery of the left lower lobe (image 122, series five).  The overall clot burden is felt to be small to moderate.  Caliber of the main pulmonary artery is  normal measuring 3 cm in diameter.  There is no definitive bowing of the interventricular septum.  There is no reflux of injected contrast into the hepatic venous system.  Normal heart size.  Trace amount of pericardial fluid, possibly physiologic.  Normal caliber thoracic aorta.  Left anterior chest wall port catheter tip terminates within the distal SVC.  -----------------------------------------------------------  Nonvascular  findings:  There has been interval development of rather extensive ground- glass opacities within the bilateral lungs with slight apical predominance.  This represents a marked change from prior chest CT performed 07/26/2012.  No discrete air bronchograms.  Central pulmonary airways are patent.  No pleural effusion or pneumothorax.  Interval development of mediastinal and bilateral hilar adenopathy with index pretracheal node measuring 1.1 cm in short axis diameter (image 26, series 4, right hilar nodal the lung aeration measuring approximately 1.2 cm (image 36) and left hilar nodal conglomeration measuring approximately 0.9 cm (image 27).  Interval resolution of right-sided axillary adenopathy.  Limited early arterial phase evaluation of the upper abdomen suggests decreased attenuation of the hepatic parenchyma suggestive of hepatic steatosis.  Hiatal hernia.  Evaluation of the sternum is degraded secondary to streak artifact and patient motion, however there is a possible nondisplaced fracture involving the mid aspect of the sternum (image 78, series nine).  No discrete aggressive osseous lesions.  Decreased conspicuity of previously noted right-sided retroareolar mass.  IMPRESSION:  1.  Examination is positive for bilateral pulmonary embolism, right greater than left. Overall clot burden is felt to be small to moderate.  No CT evidence of right-sided heart failure.  2.  Interval development of extensive bilateral heterogeneous opacities with slight upper lobe predominance.  Differential considerations are broad and include drug toxicity, though note, atypical infection may have a similar appearance. 3.  Interval development of mediastinal bilateral hilar adenopathy, again nonspecific and while possibly reactive, metastatic disease is not excluded.  4.  Interval resolution of right-sided axillary adenopathy and mass like thickening within the right retro alveolar breast.  5. Possible nondisplaced fracture of the  sternum, possibly artifactual due to respiratory artifact.  Correlation for point tenderness at this location is recommended.  No discrete associated aggressive osseous lesion.  Above findings discussed with Dr. Lorenso Courier at [phone].   Original Report Authenticated By: Tacey Ruiz, MD    Mr Breast Bilateral W Wo Contrast  12/08/2012  *RADIOLOGY REPORT*  Clinical Data: Right breast cancer.  Assess response to neoadjuvant chemotherapy.  BILATERAL BREAST MRI WITH AND WITHOUT CONTRAST  Technique: Multiplanar, multisequence MR images of both breasts were obtained prior to and following the intravenous administration of 13ml of Multihance.  Three dimensional images were evaluated at the independent DynaCad workstation.  Comparison:  Bilateral breast MRI 07/19/2012  Findings: The right breast is smaller than the left.  There has been a complete MRI response of the right breast to neoadjuvant therapy.  The previously seen diffuse abnormal enhancement throughout the right breast on MRI of 07/19/2012 has resolved. Currently, both breasts have symmetric mild background parenchymal enhancement pattern and the vascularity of both breasts is symmetric.  There is no abnormal or asymmetric T2-weighted signal in either breast.  The left breast remains negative.  There is no axillary lymphadenopathy.  Specifically, the right axillary lymph nodes have significantly decreased in size and are now symmetric compared to left axillary lymph nodes.  There is no internal mammary chain lymphadenopathy.  IMPRESSION:  1.  Complete MRI response of the right breast to neoadjuvant therapy. 2.  The left breast remains negative. 3.  Right axillary lymph nodes are now normal in size.  RECOMMENDATION: Treatment planning  THREE-DIMENSIONAL MR IMAGE RENDERING ON INDEPENDENT WORKSTATION:  Three-dimensional MR images were rendered by post-processing of the original MR data on an independent workstation.  The three- dimensional MR images were interpreted, and  findings were reported in the accompanying complete MRI report for this study.  BI-RADS CATEGORY 6:  Known biopsy-proven malignancy - appropriate action should be taken.   Original Report Authenticated By: Britta Mccreedy, M.D.    Ir Cv Line Injection  12/03/2012  *RADIOLOGY REPORT*  Clinical Data:  Pain and popping sensation with use of a left-sided Port-A-Cath.  History of breast carcinoma.  CONTRAST INJECTION OF PORT A CATH UNDER FLUOROSCOPY  Contrast:  12 ml Omnipaque-300  Fluoroscopy Time:  0.5 minutes.  Contrast was administered via the indwelling port after it was accessed.  Fluoroscopic spot images were obtained of the catheter during injection.  Findings:  A left-sided subclavian Port-A-Cath is in good position with the catheter tip located at the cavoatrial junction.  Contrast injection shows no evidence of leakage around the port reservoir or around the attached catheter.  There is a very prominent fibrin sheath along the distal aspect of the catheter.  Contrast exits primarily a perforation in the fibrin sheath along the medial aspect of the catheter in the SVC with a small amount also exiting the tip of the catheter. Contrast does enter the SVC and there is no evidence of extravascular catheter positioning.  IMPRESSION: Prominent fibrin sheath involving the distal aspect of the indwelling Port-A-Cath.  Contrast primarily exits a perforation along the medial aspect of the sheath.  The catheter is intravascular and shows no evidence of leakage or destruction.   Original Report Authenticated By: Irish Lack, M.D.    Results for orders placed during the hospital encounter of 12/27/12  URINALYSIS, ROUTINE W REFLEX MICROSCOPIC      Component Value Range   Color, Urine YELLOW  YELLOW   APPearance CLOUDY (*) CLEAR   Specific Gravity, Urine 1.025  1.005 - 1.030   pH 6.0  5.0 - 8.0   Glucose, UA >1000 (*) NEGATIVE mg/dL   Hgb urine dipstick NEGATIVE  NEGATIVE   Bilirubin Urine NEGATIVE  NEGATIVE    Ketones, ur TRACE (*) NEGATIVE mg/dL   Protein, ur 147 (*) NEGATIVE mg/dL   Urobilinogen, UA 1.0  0.0 - 1.0 mg/dL   Nitrite NEGATIVE  NEGATIVE   Leukocytes, UA SMALL (*) NEGATIVE  BLOOD GAS, ARTERIAL      Component Value Range   O2 Content 2.0     Delivery systems NASAL CANNULA     pH, Arterial 7.462 (*) 7.350 - 7.450   pCO2 arterial 34.5 (*) 35.0 - 45.0 mmHg   pO2, Arterial 65.0 (*) 80.0 - 100.0 mmHg   Bicarbonate 24.3 (*) 20.0 - 24.0 mEq/L   TCO2 22.8  0 - 100 mmol/L   Acid-Base Excess 1.1  0.0 - 2.0 mmol/L   O2 Saturation 94.0     Patient temperature 98.6     Collection site RIGHT RADIAL     Drawn by [id number]     Sample type ARTERIAL DRAW     Allens test (pass/fail) PASS  PASS  LACTIC ACID, PLASMA      Component Value Range   Lactic Acid, Venous 1.6  0.5 - 2.2 mmol/L  PROTIME-INR      Component Value Range   Prothrombin Time 13.8  11.6 -  15.2 seconds   INR 1.07  0.00 - 1.49  CBC WITH DIFFERENTIAL      Component Value Range   WBC 8.6  4.0 - 10.5 K/uL   RBC 3.07 (*) 3.87 - 5.11 MIL/uL   Hemoglobin 9.9 (*) 12.0 - 15.0 g/dL   HCT 96.0 (*) 45.4 - 09.8 %   MCV 92.5  78.0 - 100.0 fL   MCH 32.2  26.0 - 34.0 pg   MCHC 34.9  30.0 - 36.0 g/dL   RDW 11.9 (*) 14.7 - 82.9 %   Platelets 173  150 - 400 K/uL   Neutrophils Relative 91 (*) 43 - 77 %   Lymphocytes Relative 5 (*) 12 - 46 %   Monocytes Relative 4  3 - 12 %   Eosinophils Relative 0  0 - 5 %   Basophils Relative 0  0 - 1 %   Neutro Abs 7.9 (*) 1.7 - 7.7 K/uL   Lymphs Abs 0.4 (*) 0.7 - 4.0 K/uL   Monocytes Absolute 0.3  0.1 - 1.0 K/uL   Eosinophils Absolute 0.0  0.0 - 0.7 K/uL   Basophils Absolute 0.0  0.0 - 0.1 K/uL   RBC Morphology MARKED POLYCHROMASIA     WBC Morphology INCREASED BANDS (>20% BANDS)     Smear Review PLATELET COUNT CONFIRMED BY SMEAR    COMPREHENSIVE METABOLIC PANEL      Component Value Range   Sodium 124 (*) 135 - 145 mEq/L   Potassium 3.9  3.5 - 5.1 mEq/L   Chloride 90 (*) 96 - 112 mEq/L   CO2 23   19 - 32 mEq/L   Glucose, Bld 281 (*) 70 - 99 mg/dL   BUN 14  6 - 23 mg/dL   Creatinine, Ser 5.62  0.50 - 1.10 mg/dL   Calcium 8.2 (*) 8.4 - 10.5 mg/dL   Total Protein 5.9 (*) 6.0 - 8.3 g/dL   Albumin 2.3 (*) 3.5 - 5.2 g/dL   AST 39 (*) 0 - 37 U/L   ALT 36 (*) 0 - 35 U/L   Alkaline Phosphatase 62  39 - 117 U/L   Total Bilirubin 0.7  0.3 - 1.2 mg/dL   GFR calc non Af Amer >90  >90 mL/min   GFR calc Af Amer >90  >90 mL/min  PROCALCITONIN      Component Value Range   Procalcitonin 0.62    SAMPLE TO BLOOD BANK      Component Value Range   Blood Bank Specimen SAMPLE AVAILABLE FOR TESTING     Sample Expiration 12/30/2012    URINE MICROSCOPIC-ADD ON      Component Value Range   Squamous Epithelial / LPF FEW (*) RARE   WBC, UA 11-20  <3 WBC/hpf   Bacteria, UA FEW (*) RARE   Urine-Other MUCOUS PRESENT     A/P:Pt with hx breast cancer, prothrombin gene mutation, bilateral DVT (on lovenox) and acute bilateral PE. Plan is for IVC filter placement today. Details/risks of procedure d/w pt with her understanding and consent.

## 2012-12-27 NOTE — ED Notes (Signed)
Patient transported to CT 

## 2012-12-27 NOTE — Procedures (Signed)
Successful IVC FILTER PLACEMENT NO COMP STABLE

## 2012-12-27 NOTE — ED Notes (Signed)
Pt sent over by [facility] d/t acute PE with respiratory distress, pt was here for f/u from dx of DVT to bilateral legs last wk and place on Lovenox

## 2012-12-27 NOTE — Progress Notes (Signed)
ANTIBIOTIC CONSULT NOTE - INITIAL  Pharmacy Consult for Vancomycin and Zosyn Indication: Sepsis  Allergies  Allergen Reactions  . Anesthetics, Amide Nausea And Vomiting    Projectile vomitting-Nausea- with 24hrs of dry heaves. With any anesthetics  . Bactrim (Sulfamethoxazole W-Trimethoprim) Other (See Comments)    Massive diarrhea, nausea vomiting, platelets dropped, hemoglobin dropped, admitted to hospital  . Sulfa Antibiotics Nausea And Vomiting and Other (See Comments)    Crazy feeling in head Life threatening reaction, decreased blood counts  . Codeine Nausea And Vomiting  . Codeine Phosphate Nausea And Vomiting  . Eggs Or Egg-Derived Products Hives    Patient Measurements: Ht: 61in Wt: 67kg  Vital Signs: Temp: 101.5 F (38.6 C) (01/27 1125) Temp src: Oral (01/27 1125) BP: 131/73 mmHg (01/27 1125) Pulse Rate: 128  (01/27 1125) Intake/Output from previous day:   Intake/Output from this shift:    Labs:  Basename 12/27/12 1048  WBC 9.5  HGB 10.1*  PLT 181  LABCREA --  CREATININE 1.1   CrCl = 55 ml/min/1.66m2 (normalized)  Microbiology: Recent Results (from the past 720 hour(s))  TECHNOLOGIST REVIEW     Status: Normal   Collection Time   12/17/12 10:49 AM      Component Value Range Status Comment   Technologist Review Metas and Myelocytes present, 6% nrbcs present   Final   TECHNOLOGIST REVIEW     Status: Normal   Collection Time   12/22/12 12:44 PM      Component Value Range Status Comment   Technologist Review Metas and Myelocytes present   Final     Medical History: Past Medical History  Diagnosis Date  . Asthma   . GERD (gastroesophageal reflux disease)   . Dysrhythmia     PAT-sees dr Laurence Compton meds  . Breast cancer 07/14/12    inflammatory right breast ca, ER/PR -  . Allergy   . PAT (paroxysmal atrial tachycardia)     hx  . Arthritis     osteopenia,knees  . PONV (postoperative nausea and vomiting) 09-13-12    severe, with Port-a-cath, was  managed without PONV  . Clotting disorder   . History of chemotherapy   . DVT (deep venous thrombosis)     Medications:  Scheduled:    . [COMPLETED] acetaminophen       Infusions:    . [COMPLETED] sodium chloride 1,000 mL (12/27/12 1210)   Followed by  . sodium chloride     Followed by  . sodium chloride     Assessment:  42 YOF with newly diagnosed inflammatory breast cancer who was brought from cancer center with presumed acute PE. Was recently started on full-dose lovenox for bilateral DVTs.  Now beginning broad spectrum antibiotics for possible sepsis  SCr 1.1, estimated CrCl ~55 ml/min, febrile to 101.5, blood and urine cultures pending  Goal of Therapy:  Vancomycin trough level 15-20 mcg/ml  Plan:   Vancomycin 750mg  IV x 1, then 500mg  IV q12h Check trough at steady state Zosyn 3.375gm IV q8h (4hr extended infusions) Follow up renal function & cultures  Loralee Pacas, PharmD, BCPS Pager: [phone] 12/27/2012,12:29 PM

## 2012-12-27 NOTE — ED Notes (Signed)
[id number]> Expected date:<BR> Expected time:<BR> Means of arrival:<BR> Comments:<BR> Acute PE, pt coming from [facility]

## 2012-12-27 NOTE — H&P (Signed)
[facility] History and Physical  Jessica Wells ZOX:[id number] DOB: Nov 09, 1946 DOA: 12/27/2012  Referring physician: er PCP: [name], MD  Specialists:   Chief Complaint: SOB  HPI: Jessica Wells is a 67 y.o. female  Who was sent over from the cancer center after arriving at the office and finding her color gray and she was breathing 4- times/min, HR 130-140 and O2 sats 70% on room air.    She recently was diagnosed with b/l DVTs and started on SQ lovenox on 1/24 (70 mg q 12).  At that time, they did a coagulation panel as well that showed a + prothrombin gene mutation- heterozygous.  CTA was requested by  Me to be doen in the ER.  Oncology recommends and IVC filter if CTA positive for PE.  In the ER patient was found to have a low grade fever and possible PNA on chest x ray.  No WBC, no cough  Patient is complaining of several days of worsening SOB, mild chills, no fevers.  This was not sudden in onset.  + worsening of her fatigue and weakness.  Her DVTs were diagnosed when she had leg swelling.    Review of Systems: all systems reviewed, negative unless stated above  Past Medical History  Diagnosis Date  . Asthma   . GERD (gastroesophageal reflux disease)   . Dysrhythmia     PAT-sees dr Laurence Compton meds  . Breast cancer 07/14/12    inflammatory right breast ca, ER/PR -  . Allergy   . PAT (paroxysmal atrial tachycardia)     hx  . Arthritis     osteopenia,knees  . PONV (postoperative nausea and vomiting) 09-13-12    severe, with Port-a-cath, was managed without PONV  . Clotting disorder   . History of chemotherapy   . DVT (deep venous thrombosis)    Past Surgical History  Procedure Date  . Dilation and curettage of uterus     x3  . Tonsillectomy   . Colonoscopy   . Portacath placement 07/21/2012    Procedure: INSERTION PORT-A-CATH;  Surgeon: Emelia Loron, MD;  Location:  SURGERY CENTER;  Service: General;  Laterality: Left;   Social  History:  reports that she quit smoking about 33 years ago. She has never used smokeless tobacco. She reports that she does not drink alcohol or use illicit drugs.   Allergies  Allergen Reactions  . Anesthetics, Amide Nausea And Vomiting    Projectile vomitting-Nausea- with 24hrs of dry heaves. With any anesthetics  . Bactrim (Sulfamethoxazole W-Trimethoprim) Other (See Comments)    Massive diarrhea, nausea vomiting, platelets dropped, hemoglobin dropped, admitted to hospital  . Sulfa Antibiotics Nausea And Vomiting and Other (See Comments)    Crazy feeling in head Life threatening reaction, decreased blood counts  . Codeine Nausea And Vomiting  . Codeine Phosphate Nausea And Vomiting  . Eggs Or Egg-Derived Products Hives    Family History  Problem Relation Age of Onset  . Diabetes Mother   . Heart disease Mother   . Hypertension Mother   . Hyperlipidemia Mother   . Breast cancer Mother 26    lobular breast cancer  . Heart disease Maternal Aunt   . Heart disease Maternal Uncle   . Heart disease Maternal Grandmother     died in her 25s from heart disease  . Lung cancer Father 60    adenocarcinoma  . Cancer Paternal Uncle     dx in mid 32s with a cancer in the leg, died  late 50s; grandpaternal half uncle    Prior to Admission medications   Medication Sig Start Date End Date Taking? Authorizing Provider  albuterol (PROVENTIL HFA;VENTOLIN HFA) 108 (90 BASE) MCG/ACT inhaler Inhale 2 puffs into the lungs every 6 (six) hours as needed.    Historical Provider, MD  Alum & Mag Hydroxide-Simeth (MAGIC MOUTHWASH W/LIDOCAINE) SOLN Take 5 mLs by mouth 4 (four) times daily as needed. 10/08/12   Augustin Schooling, NP  b complex vitamins tablet Take 1 tablet by mouth daily.    Historical Provider, MD  calcium-vitamin D (OSCAL WITH D) 500-200 MG-UNIT per tablet Take 1 tablet by mouth daily.    Historical Provider, MD  enoxaparin (LOVENOX) 80 MG/0.8ML injection Inject 0.7 mLs (70 mg total) into  the skin every 12 (twelve) hours. 12/22/12   Augustin Schooling, NP  fish oil-omega-3 fatty acids 1000 MG capsule Take 2 g by mouth daily.    Historical Provider, MD  Flaxseed, Linseed, (FLAX SEEDS PO) Take 1 tablet by mouth daily.    Historical Provider, MD  fluconazole (DIFLUCAN) 200 MG tablet Take 1 tablet (200 mg total) by mouth daily. 11/26/12   Augustin Schooling, NP  fluticasone (FLONASE) 50 MCG/ACT nasal spray Place 1 spray into the nose daily as needed. For allergies    Historical Provider, MD  glucosamine-chondroitin 500-400 MG tablet Take 1 tablet by mouth daily.    Historical Provider, MD  lidocaine-prilocaine (EMLA) cream Apply topically as needed. 07/22/12 07/22/13  Victorino December, MD  metoprolol succinate (TOPROL XL) 50 MG 24 hr tablet Take 1 tablet (50 mg total) by mouth daily. Take with or immediately following a meal. 10/14/12   Amy D Clegg, NP  Multiple Vitamin (MULTIVITAMIN WITH MINERALS) TABS Take 1 tablet by mouth daily.    Historical Provider, MD  omeprazole (PRILOSEC) 20 MG capsule Take 20 mg by mouth daily as needed. For heartburn    Historical Provider, MD  ondansetron (ZOFRAN) 8 MG tablet  09/20/12   Historical Provider, MD  oxyCODONE-acetaminophen (ROXICET) 5-325 MG per tablet Take 1 tablet by mouth every 4 (four) hours as needed for pain. 09/15/12   Emelia Loron, MD  valACYclovir (VALTREX) 500 MG tablet Take 1 tablet (500 mg total) by mouth 3 (three) times daily. 08/13/12 08/13/13  Victorino December, MD   Physical Exam: Filed Vitals:   12/27/12 1125 12/27/12 1400  BP: 131/73 110/62  Pulse: 128 85  Temp: 101.5 F (38.6 C)   TempSrc: Oral   Resp: 34   SpO2: 93% 93%     General:  Chronically ill appearing female  Eyes: wnl  ENT: dry mucous membranes  Neck: supple  Cardiovascular: rrr- occasionally tachy  Respiratory: mild expiratory wheezing  Abdomen: +BS soft, NT.ND  Skin: no rashes or lesions  Musculoskeletal: moves all 4  extremitites  Psychiatric:no SI/no HI  Neurologic: CN 2-12 intact  Labs on Admission:  Basic Metabolic Panel:  Lab 12/27/12 6578 12/27/12 1048  NA 124* 126*  K 3.9 4.0  CL 90* 92*  CO2 23 24  GLUCOSE 281* 288*  BUN 14 14.7  CREATININE 0.59 1.1  CALCIUM 8.2* 8.7  MG -- --  PHOS -- --   Liver Function Tests:  Lab 12/27/12 1136  AST 39*  ALT 36*  ALKPHOS 62  BILITOT 0.7  PROT 5.9*  ALBUMIN 2.3*   No results found for this basename: LIPASE:5,AMYLASE:5 in the last 168 hours No results found for this basename: AMMONIA:5 in the last 168 hours CBC:  Lab 12/27/12 1136 12/27/12 1048 12/22/12 1244  WBC 8.6 9.5 6.4  NEUTROABS 7.9* 8.5* 5.5  HGB 9.9* 10.1* 10.6*  HCT 28.4* 29.3* 30.5*  MCV 92.5 91.6 93.6  PLT 173 181 98*   Cardiac Enzymes: No results found for this basename: CKTOTAL:5,CKMB:5,CKMBINDEX:5,TROPONINI:5 in the last 168 hours  BNP (last 3 results) No results found for this basename: PROBNP:3 in the last 8760 hours CBG: No results found for this basename: GLUCAP:5 in the last 168 hours  Radiological Exams on Admission: Dg Chest 2 View  12/27/2012  *RADIOLOGY REPORT*  Clinical Data: Fever with shortness of breath.  History of breast cancer.  CHEST - 2 VIEW  Comparison: 09/15/2012.  Findings: Left subclavian Port-A-Cath tip is unchanged at the SVC right atrial junction.  The heart size and mediastinal contours are stable.  There are new nodular air space opacities in both lungs, right greater than left.  There is no significant pleural effusion. The osseous structures appear unremarkable.  IMPRESSION: New bilateral air space opacities, right greater than left.  In the setting, these likely represent pneumonia.  Radiographic followup is necessary to exclude metastatic disease.   Original Report Authenticated By: Carey Bullocks, M.D.       Assessment/Plan Active Problems:  Breast cancer  Clotting disorder  DVT (deep venous thrombosis)  SOB (shortness of  breath)  Healthcare-associated pneumonia  Hyponatremia  Acute respiratory distress  Hypoxemia  Hyperglycemia   1. DVT recent diagnosis/?PE: await CTA of lungs- if PE + will need IVC filter placed, lovenox per pharmacy, no SCDs, echo done- await reading 2. PNA- new diffuse B/L opacities R>L; blood cultures, strep pneumo, legionella pending, on chemo will give vanc/zosyn 3. SOB- from above 4. Wheezing- nebs 5. hypontremia- appears dry, give IVF 6. Hypoexmia/acute respiratory failure- O2, from PE vs PNA- await CT scan, wean off O2 as tolerated 7. Hyperglycemia- SSI 8.   + prothrombin gene mutation- heterozygous 9.  Breast CA- defer to Dr. Welton Flakes    Code Status: full Family Communication: husband at bedside Disposition Plan: 2-3 days  Time spent: 35   Benjamine Mola [facility] Pager [phone]   If 7PM-7AM, please contact night-coverage [email or web address] Password [facility] 12/27/2012, 2:11 PM

## 2012-12-27 NOTE — Progress Notes (Signed)
*  PRELIMINARY RESULTS* Echocardiogram 2D Echocardiogram has been performed.  Jeryl Columbia 12/27/2012, 12:56 PM

## 2012-12-27 NOTE — Progress Notes (Signed)
Subjective/HPI: Jessica Wells is a 67 yo WF with a PMHx of breast cancer undergoing chemo, DVT, clotting disorder, paroxysmal Atrial tachycardia and SVT, among others,  admitted today with SOB x 3 days and hypoxia noted at her oncologist's office today. Her O2 sat was in the 70s at the office and pt was sent to ED. She has a recent diagnosis of DVT (Jan 2014) and has been on Lovenox.  In the ED, a CT chest revealed bilat PE and PNA. She is on treatment dose Lovenox now and had an IVC filter placed today. Also, on multi antibiotic therapy. Tests are ordered to work up atypical infection.  Tonight, RN paged this NP 2/2 pt having desaturations into the upper 80s and increasing tachypnea with WOB and SOB. NP to bedside. Jessica Wells says she has had SOB x 3 days and is vague when asked about when it got worse today or if it got worse today. She says it comes and goes and she can tell she is breathing faster. She was wheezing earlier today, but not now. She denies any pain, including chest, jaw or arm.   Objective: Vital signs in last 24 hours: Temp:  [97.1 F (36.2 C)-102.7 F (39.3 C)] 102.7 F (39.3 C) (01/27 2159) Pulse Rate:  [85-139] 119  (01/27 2159) Resp:  [20-40] 40  (01/27 2159) BP: (100-135)/(55-78) 135/75 mmHg (01/27 2159) SpO2:  [75 %-99 %] 95 % (01/27 2159) FiO2 (%):  [50 %] 50 % (01/27 2159) Weight:  [67 kg (147 lb 11.3 oz)-67.132 kg (148 lb)] 67 kg (147 lb 11.3 oz) (01/27 1710) Weight change:  Last BM Date: 12/27/12    PE: General: 67 yo obese WF lying in bed with HOB elevated in mild resp distress.  HEENT: No JVD. CARD: S1S2 without MRG. Tachycardia. RESP: tachypneic at 32-40 RPM. Use of intercostals noted. Becomes SOB with conversation. Lungs with diminished breath sounds, rales at bases. No wheezing noted.  PSYCH: alert and oriented x 3. Pleasant and cooperative.   Lab Results:  [facility] 12/27/12 1136 12/27/12 1048  WBC 8.6 9.5  HGB 9.9* 10.1*  HCT 28.4* 29.3*  PLT 173 181    BMET  Basename 12/27/12 1136 12/27/12 1048  NA 124* 126*  K 3.9 4.0  CL 90* 92*  CO2 23 24  GLUCOSE 281* 288*  BUN 14 14.7  CREATININE 0.59 1.1  CALCIUM 8.2* 8.7    Studies/Results: Dg Chest 2 View  12/27/2012  *RADIOLOGY REPORT*  Clinical Data: Fever with shortness of breath.  History of breast cancer.  CHEST - 2 VIEW  Comparison: 09/15/2012.  Findings: Left subclavian Port-A-Cath tip is unchanged at the SVC right atrial junction.  The heart size and mediastinal contours are stable.  There are new nodular air space opacities in both lungs, right greater than left.  There is no significant pleural effusion. The osseous structures appear unremarkable.  IMPRESSION: New bilateral air space opacities, right greater than left.  In the setting, these likely represent pneumonia.  Radiographic followup is necessary to exclude metastatic disease.   Original Report Authenticated By: Carey Bullocks, M.D.    Ct Angio Chest Pe W/cm &/or Wo Cm  12/27/2012  *RADIOLOGY REPORT*  Clinical Data: Pleuritic chest pain, hypoxemia, tachycardia, history of breast cancer with new diagnosis of DVT, evaluate for pulmonary embolism  CT ANGIOGRAPHY CHEST  Technique:  Multidetector CT imaging of the chest using the standard protocol during bolus administration of intravenous contrast. Multiplanar reconstructed images including MIPs were obtained  and reviewed to evaluate the vascular anatomy.  Contrast: OMNIPAQUE IOHEXOL 350 MG/ML SOLN  Comparison: CT chest, abdomen pelvis - 07/26/2012; PET CT - 07/26/2012; chest radiograph - earlier same day  Vascular Findings:  There is suboptimal opacification of the pulmonary arterial system of the main pulmonary artery measuring only 217 HU.  Given this limitation, there is are discrete filling defects within the peripheral aspect of the right main pulmonary artery (image 114, series 5) with nonocclusive thrombus extending into nearly all major segmental branches of the right  lung.  There is a there is nonocclusive thrombus within the left anterior/medial basilar segmental pulmonary artery of the left lower lobe (image 122, series five).  The overall clot burden is felt to be small to moderate.  Caliber of the main pulmonary artery is normal measuring 3 cm in diameter.  There is no definitive bowing of the interventricular septum.  There is no reflux of injected contrast into the hepatic venous system.  Normal heart size.  Trace amount of pericardial fluid, possibly physiologic.  Normal caliber thoracic aorta.  Left anterior chest wall port catheter tip terminates within the distal SVC.  -----------------------------------------------------------  Nonvascular findings:  There has been interval development of rather extensive ground- glass opacities within the bilateral lungs with slight apical predominance.  This represents a marked change from prior chest CT performed 07/26/2012.  No discrete air bronchograms.  Central pulmonary airways are patent.  No pleural effusion or pneumothorax.  Interval development of mediastinal and bilateral hilar adenopathy with index pretracheal node measuring 1.1 cm in short axis diameter (image 26, series 4, right hilar nodal the lung aeration measuring approximately 1.2 cm (image 36) and left hilar nodal conglomeration measuring approximately 0.9 cm (image 27).  Interval resolution of right-sided axillary adenopathy.  Limited early arterial phase evaluation of the upper abdomen suggests decreased attenuation of the hepatic parenchyma suggestive of hepatic steatosis.  Hiatal hernia.  Evaluation of the sternum is degraded secondary to streak artifact and patient motion, however there is a possible nondisplaced fracture involving the mid aspect of the sternum (image 78, series nine).  No discrete aggressive osseous lesions.  Decreased conspicuity of previously noted right-sided retroareolar mass.  IMPRESSION:  1.  Examination is positive for bilateral  pulmonary embolism, right greater than left. Overall clot burden is felt to be small to moderate.  No CT evidence of right-sided heart failure.  2.  Interval development of extensive bilateral heterogeneous opacities with slight upper lobe predominance.  Differential considerations are broad and include drug toxicity, though note, atypical infection may have a similar appearance. 3.  Interval development of mediastinal bilateral hilar adenopathy, again nonspecific and while possibly reactive, metastatic disease is not excluded.  4.  Interval resolution of right-sided axillary adenopathy and mass like thickening within the right retro alveolar breast.  5. Possible nondisplaced fracture of the sternum, possibly artifactual due to respiratory artifact.  Correlation for point tenderness at this location is recommended.  No discrete associated aggressive osseous lesion.  Above findings discussed with Dr. Lorenso Courier at [phone].   Original Report Authenticated By: Tacey Ruiz, MD     Medications: reviewed  Assessment/Plan:  1. Acute resp distress with PO2 53% on ABG. PCO2 normal. PH only slightly high. O2 sats 98% on 15L per venturi mask which was progressed from [place]. This is likely 2/2 combination of bilat PE and PNA. Given her resp distress and low PO2, I think it is reasonable to transfer to SDU for closer monitoring  tonight. R/P ABG at [place]. Cont Venturi mask at 15L for now. May have to escalate treatment if WOB doesn't slow.  2. PNA-? Worsening. T 102. May be worsening. Cont IV abx. Atypical resp infection tests still pending. CBC with diff now. 3. Bilat PE-cont Lovenox tx dose. S/p IVC filter today. Site has looked fine since procedure.  4. COPD-no wheezing at present. Nebs.    Jimmye Norman 12/27/2012, 10:27 PM

## 2012-12-27 NOTE — ED Notes (Signed)
Aram Beecham RN at bedside.  Paged hospitalist on call to notify them of this status.  Went in to check on pt again, pt's HR is back down to 88

## 2012-12-27 NOTE — Progress Notes (Signed)
Shift update: pt now in SDU. Feels some better. Breathing slowing. RR in the 20s now and pt conts to sat normally on 50% venturi mask. Dr. Darrick Penna, PCCM, camera'd into room and agrees with plan. Cont to follow. Jimmye Norman, NP [facility]

## 2012-12-28 ENCOUNTER — Telehealth (INDEPENDENT_AMBULATORY_CARE_PROVIDER_SITE_OTHER): Payer: Self-pay

## 2012-12-28 DIAGNOSIS — R0902 Hypoxemia: Secondary | ICD-10-CM

## 2012-12-28 DIAGNOSIS — E871 Hypo-osmolality and hyponatremia: Secondary | ICD-10-CM

## 2012-12-28 DIAGNOSIS — R7309 Other abnormal glucose: Secondary | ICD-10-CM

## 2012-12-28 DIAGNOSIS — B370 Candidal stomatitis: Secondary | ICD-10-CM

## 2012-12-28 DIAGNOSIS — R0602 Shortness of breath: Secondary | ICD-10-CM

## 2012-12-28 DIAGNOSIS — Z9221 Personal history of antineoplastic chemotherapy: Secondary | ICD-10-CM

## 2012-12-28 DIAGNOSIS — I2699 Other pulmonary embolism without acute cor pulmonale: Secondary | ICD-10-CM

## 2012-12-28 LAB — LEGIONELLA ANTIGEN, URINE

## 2012-12-28 LAB — BLOOD GAS, ARTERIAL
Acid-base deficit: 0.6 mmol/L (ref 0.0–2.0)
Bicarbonate: 24.8 mEq/L — ABNORMAL HIGH (ref 20.0–24.0)
Drawn by: 232811
Drawn by: 232811
FIO2: 0.5 %
TCO2: 21.5 mmol/L (ref 0–100)
pCO2 arterial: 37 mmHg (ref 35.0–45.0)
pCO2 arterial: 38.8 mmHg (ref 35.0–45.0)
pH, Arterial: 7.425 (ref 7.350–7.450)
pO2, Arterial: 60.4 mmHg — ABNORMAL LOW (ref 80.0–100.0)
pO2, Arterial: 78 mmHg — ABNORMAL LOW (ref 80.0–100.0)

## 2012-12-28 LAB — URINE CULTURE

## 2012-12-28 LAB — GLUCOSE, CAPILLARY: Glucose-Capillary: 147 mg/dL — ABNORMAL HIGH (ref 70–99)

## 2012-12-28 LAB — STREP PNEUMONIAE URINARY ANTIGEN: Strep Pneumo Urinary Antigen: NEGATIVE

## 2012-12-28 MED ORDER — MORPHINE SULFATE 2 MG/ML IJ SOLN
1.0000 mg | INTRAMUSCULAR | Status: AC | PRN
  Administered 2012-12-28 (×2): 1 mg via INTRAVENOUS
  Filled 2012-12-28 (×2): qty 1

## 2012-12-28 MED ORDER — MORPHINE SULFATE 2 MG/ML IJ SOLN
1.0000 mg | INTRAMUSCULAR | Status: AC | PRN
  Administered 2012-12-28 – 2012-12-29 (×2): 1 mg via INTRAVENOUS
  Filled 2012-12-28 (×2): qty 1

## 2012-12-28 NOTE — Progress Notes (Signed)
CARE MANAGEMENT NOTE 12/28/2012  Patient:  Jessica Wells, Jessica Wells   Account Number:  [id number]  Date Initiated:  12/28/2012  Documentation initiated by:  DAVIS,RHONDA  Subjective/Objective Assessment:   pt with confirnmed bil pe had insertion of ivc     Action/Plan:   from home   Anticipated DC Date:  12/31/2012   Anticipated DC Plan:  HOME/SELF CARE  In-house referral  NA      DC Planning Services  NA      [facility] Choice  NA   Choice offered to / List presented to:  NA   DME arranged  NA      DME agency  NA     HH arranged  NA      HH agency  NA   Status of service:  In process, will continue to follow Medicare Important Message given?  NA - LOS <3 / Initial given by admissions (If response is "NO", the following Medicare IM given date fields will be blank) Date Medicare IM given:   Date Additional Medicare IM given:    Discharge Disposition:    Per UR Regulation:  Reviewed for med. necessity/level of care/duration of stay  If discussed at Long Length of Stay Meetings, dates discussed:    Comments:  [id number] Earlene Plater, RN, BSN, CCM:  CHART REVIEWED AND UPDATED.  Next chart review due on [id number]. NO DISCHARGE NEEDS PRESENT AT THIS TIME. CASE MANAGEMENT [phone]

## 2012-12-28 NOTE — Progress Notes (Signed)
The patient with fever and bilateral infiltrates; also PE  Given the atypical aperance of infiltrates, flu pcr ordered

## 2012-12-28 NOTE — Telephone Encounter (Signed)
Pt notified that Dr Dwain Sarna is aware that the pt is in the hospital in ICU. Dr Dwain Sarna will try to get over there to see her tomorrow if he can't get by today b/c he is in surgery today. The son understands.

## 2012-12-28 NOTE — Progress Notes (Signed)
Shift update: Pt conts to have tachypnea rising into the 30s but is in the 20s at other times. O2 sats are staying normal on Venturi mask. Because of the tachypnea, this NP called Dr. Darrick Penna, who camera'd in on pt, and stated she looked pretty good. Her r/p ABG was much improved with a normal pH, PCO2 and an improved PO2 to 73 (was 53). Will try some Morphine at low dose to see if we can get respirations slowed and help pt to rest. It may be a good idea to recheck another ABG. Pt does not appear to be working hard enough to warrant bipap at this time. Will cont to follow. Jimmye Norman, NP [facility]

## 2012-12-28 NOTE — Progress Notes (Signed)
[facility] PROGRESS NOTE  Jessica Wells ZOX:[id number] DOB: [birth year] DOA: 12/27/2012 PCP: [name], MD  Assessment/Plan: 1. Pulmonary embolism - In context of patient with history of + prothrombin gene mutation-heterozygous and breast cancer - Pt is s/p IVC filter placement - On full dose lovenox - Will continue to monitor in step down unit given tachypnea.  Patient feels better at this point.  2. PNA - Continue current antibiotic regimen - WBC trending down - patient is still febrile - Legionella/strep urine antigen negative.  3. SOB - 2ary to # 1 and #2 will provide supplemental oxygen. - If not improved next am would consider pulmonology consult  4. Hyponatremia - improved after IVF rehydration  5. Hyperglycemia - blood sugars relatively well controlled on SSI - continue to monitor blood sugars - not on oral hypoglycemic medication at home.  Will order hemoglobin aic  6. Breast Cancer - follow by Dr. Welton Flakes as outpatient - Patient supposedly scheduled for mastectomy as outpatient.  Dr. Dwain Sarna supposedly followed patient as outpatient.  Code Status:  full code Family Communication: No family at bedside Disposition Plan: Pending further improvement in respiratory condition   Consultants:  None  Case discussed with oncology initially  Procedures:  IVC filter placement  Antibiotics:  Pt in on zosyn and vancomycin  Also on valacyclovir  HPI/Subjective: Patient reports feeling better. No new complaints.  No acute issues overnight reported to me.  Denies any hemoptysis  Objective: Filed Vitals:   12/28/12 1100 12/28/12 1208 12/28/12 1305 12/28/12 1558  BP:  113/69  129/68  Pulse:  94  95  Temp: 99 F (37.2 C)  98.5 F (36.9 C) 98.8 F (37.1 C)  TempSrc: Axillary  Oral Oral  Resp:  36  42  Height:      Weight:      SpO2:  95%  95%    Intake/Output Summary (Last 24 hours) at 12/28/12 1647 Last data filed at 12/28/12 1617  Gross per 24 hour  Intake 2218.75 ml  Output    945 ml  Net 1273.75 ml   Filed Weights   12/27/12 1710 12/27/12 2325  Weight: 67 kg (147 lb 11.3 oz) 68.2 kg (150 lb 5.7 oz)    Exam:   General:  Pt in NAD, Alert Awake sitting up smiling  Cardiovascular: RRR, No MRG  Respiratory: increased respiratory rate, no wheezes, rhales R>L, Breath sounds BL  Abdomen: soft, NT, ND  Data Reviewed: Basic Metabolic Panel:  Lab 12/27/12 8119 12/27/12 1136 12/27/12 1048  NA 131* 124* 126*  K 3.6 3.9 4.0  CL 98 90* 92*  CO2 24 23 24   GLUCOSE 153* 281* 288*  BUN 10 14 14.7  CREATININE 0.52 0.59 1.1  CALCIUM 7.5* 8.2* 8.7  MG -- -- --  PHOS -- -- --   Liver Function Tests:  Lab 12/27/12 1136  AST 39*  ALT 36*  ALKPHOS 62  BILITOT 0.7  PROT 5.9*  ALBUMIN 2.3*   No results found for this basename: LIPASE:5,AMYLASE:5 in the last 168 hours No results found for this basename: AMMONIA:5 in the last 168 hours CBC:  Lab 12/27/12 2204 12/27/12 1136 12/27/12 1048 12/22/12 1244  WBC 6.2 8.6 9.5 6.4  NEUTROABS 5.7 7.9* 8.5* 5.5  HGB 9.0* 9.9* 10.1* 10.6*  HCT 25.2* 28.4* 29.3* 30.5*  MCV 91.3 92.5 91.6 93.6  PLT 154 173 181 98*   Cardiac Enzymes: No results found for this basename: CKTOTAL:5,CKMB:5,CKMBINDEX:5,TROPONINI:5 in the last 168 hours BNP (last  3 results) No results found for this basename: PROBNP:3 in the last 8760 hours CBG:  Lab 12/28/12 1156 12/28/12 0800 12/27/12 2242  GLUCAP 158* 147* 161*    Recent Results (from the past 240 hour(s))  TECHNOLOGIST REVIEW     Status: Normal   Collection Time   12/22/12 12:44 PM      Component Value Range Status Comment   Technologist Review Metas and Myelocytes present   Final   CULTURE, BLOOD (ROUTINE X 2)     Status: Normal (Preliminary result)   Collection Time   12/27/12 11:30 AM      Component Value Range Status Comment   Specimen Description BLOOD RIGHT HAND   Final    Special Requests BOTTLES DRAWN AEROBIC AND  ANAEROBIC 5CC   Final    Culture  Setup Time 12/27/2012 21:48   Final    Culture     Final    Value:        BLOOD CULTURE RECEIVED NO GROWTH TO DATE CULTURE WILL BE HELD FOR 5 DAYS BEFORE ISSUING A FINAL NEGATIVE REPORT   Report Status PENDING   Incomplete   CULTURE, BLOOD (ROUTINE X 2)     Status: Normal (Preliminary result)   Collection Time   12/27/12 11:30 AM      Component Value Range Status Comment   Specimen Description BLOOD LEFT HAND   Final    Special Requests BOTTLES DRAWN AEROBIC AND ANAEROBIC 5CC   Final    Culture  Setup Time 12/27/2012 21:48   Final    Culture     Final    Value:        BLOOD CULTURE RECEIVED NO GROWTH TO DATE CULTURE WILL BE HELD FOR 5 DAYS BEFORE ISSUING A FINAL NEGATIVE REPORT   Report Status PENDING   Incomplete   MRSA PCR SCREENING     Status: Normal   Collection Time   12/27/12 11:21 PM      Component Value Range Status Comment   MRSA by PCR NEGATIVE  NEGATIVE Final      Studies: Dg Chest 2 View  12/27/2012  *RADIOLOGY REPORT*  Clinical Data: Fever with shortness of breath.  History of breast cancer.  CHEST - 2 VIEW  Comparison: 09/15/2012.  Findings: Left subclavian Port-A-Cath tip is unchanged at the SVC right atrial junction.  The heart size and mediastinal contours are stable.  There are new nodular air space opacities in both lungs, right greater than left.  There is no significant pleural effusion. The osseous structures appear unremarkable.  IMPRESSION: New bilateral air space opacities, right greater than left.  In the setting, these likely represent pneumonia.  Radiographic followup is necessary to exclude metastatic disease.   Original Report Authenticated By: Carey Bullocks, M.D.    Ct Angio Chest Pe W/cm &/or Wo Cm  12/27/2012  *RADIOLOGY REPORT*  Clinical Data: Pleuritic chest pain, hypoxemia, tachycardia, history of breast cancer with new diagnosis of DVT, evaluate for pulmonary embolism  CT ANGIOGRAPHY CHEST  Technique:  Multidetector CT  imaging of the chest using the standard protocol during bolus administration of intravenous contrast. Multiplanar reconstructed images including MIPs were obtained and reviewed to evaluate the vascular anatomy.  Contrast: OMNIPAQUE IOHEXOL 350 MG/ML SOLN  Comparison: CT chest, abdomen pelvis - 07/26/2012; PET CT - 07/26/2012; chest radiograph - earlier same day  Vascular Findings:  There is suboptimal opacification of the pulmonary arterial system of the main pulmonary artery measuring only 217 HU.  Given this limitation, there is are discrete filling defects within the peripheral aspect of the right main pulmonary artery (image 114, series 5) with nonocclusive thrombus extending into nearly all major segmental branches of the right lung.  There is a there is nonocclusive thrombus within the left anterior/medial basilar segmental pulmonary artery of the left lower lobe (image 122, series five).  The overall clot burden is felt to be small to moderate.  Caliber of the main pulmonary artery is normal measuring 3 cm in diameter.  There is no definitive bowing of the interventricular septum.  There is no reflux of injected contrast into the hepatic venous system.  Normal heart size.  Trace amount of pericardial fluid, possibly physiologic.  Normal caliber thoracic aorta.  Left anterior chest wall port catheter tip terminates within the distal SVC.  -----------------------------------------------------------  Nonvascular findings:  There has been interval development of rather extensive ground- glass opacities within the bilateral lungs with slight apical predominance.  This represents a marked change from prior chest CT performed 07/26/2012.  No discrete air bronchograms.  Central pulmonary airways are patent.  No pleural effusion or pneumothorax.  Interval development of mediastinal and bilateral hilar adenopathy with index pretracheal node measuring 1.1 cm in short axis diameter (image 26, series 4, right hilar  nodal the lung aeration measuring approximately 1.2 cm (image 36) and left hilar nodal conglomeration measuring approximately 0.9 cm (image 27).  Interval resolution of right-sided axillary adenopathy.  Limited early arterial phase evaluation of the upper abdomen suggests decreased attenuation of the hepatic parenchyma suggestive of hepatic steatosis.  Hiatal hernia.  Evaluation of the sternum is degraded secondary to streak artifact and patient motion, however there is a possible nondisplaced fracture involving the mid aspect of the sternum (image 78, series nine).  No discrete aggressive osseous lesions.  Decreased conspicuity of previously noted right-sided retroareolar mass.  IMPRESSION:  1.  Examination is positive for bilateral pulmonary embolism, right greater than left. Overall clot burden is felt to be small to moderate.  No CT evidence of right-sided heart failure.  2.  Interval development of extensive bilateral heterogeneous opacities with slight upper lobe predominance.  Differential considerations are broad and include drug toxicity, though note, atypical infection may have a similar appearance. 3.  Interval development of mediastinal bilateral hilar adenopathy, again nonspecific and while possibly reactive, metastatic disease is not excluded.  4.  Interval resolution of right-sided axillary adenopathy and mass like thickening within the right retro alveolar breast.  5. Possible nondisplaced fracture of the sternum, possibly artifactual due to respiratory artifact.  Correlation for point tenderness at this location is recommended.  No discrete associated aggressive osseous lesion.  Above findings discussed with Dr. Lorenso Courier at [phone].   Original Report Authenticated By: Tacey Ruiz, MD    Ir Ivc Filter Plmt / S&i /img Guid/mod Sed  12/28/2012  *RADIOLOGY REPORT*  Clinical Data:  Lower extremity DVT, pulmonary emboli, breast cancer  ULTRASOUND GUIDANCE FOR VASCULAR ACCESS IVC CATHETERIZATION AND  VENOGRAM IVC FILTER INSERTION  Date:  12/27/2012 17:40:00  Radiologist:  M. Ruel Favors, M.D.  Medications:  1 mg Versed, 50 mcg Fentanyl  Guidance:  Ultrasound fluoroscopic  Fluoroscopy time:  1.8 minutes  Sedation time:  30 minutes  Contrast Volume:  40 ml Omnipaque-300  Complications:  No immediate  PROCEDURE/FINDINGS:  Informed consent was obtained from the patient following explanation of the procedure, risks, benefits and alternatives. The patient understands, agrees and consents for the procedure. All questions were  addressed.  A time out was performed.  Maximal barrier sterile technique utilized including caps, mask, sterile gowns, sterile gloves, large sterile drape, hand hygiene, and betadine prep.  Under sterile condition and local anesthesia, right internal jugular venous access was performed with ultrasound.  Over a guide wire, the IVC filter delivery sheath and inner dilator were advanced into the IVC just above the IVC bifurcation.  Contrast injection was performed for an IVC venogram.  IVC VENOGRAM:  The IVC is patent.  No evidence of thrombus, stenosis, or occlusion.  No variant venous anatomy.  The renal veins are identified at L1-2.  IVC FILTER INSERTION:  Through the delivery sheath, the Cordis Celect IVC filter was deployed in the infrarenal IVC at the L3 level just below the renal veins and above the IVC bifurcation. Contrast injection confirmed position.  There is good apposition of the filter against the IVC.  The delivery sheath was removed and hemostasis was obtained with compression for 5 minutes.  The patient tolerated the procedure well.  No immediate complications.  IMPRESSION: Ultrasound and fluoroscopically guided infrarenal IVC filter insertion.   Original Report Authenticated By: Judie Petit. Shick, M.D.     Scheduled Meds:   . enoxaparin (LOVENOX) injection  70 mg Subcutaneous Q12H  . fluconazole  200 mg Oral Daily  . insulin aspart  0-5 Units Subcutaneous QHS  . insulin aspart  0-9  Units Subcutaneous TID WC  . metoprolol succinate  50 mg Oral Daily  . pantoprazole  40 mg Oral Daily  . piperacillin-tazobactam (ZOSYN)  IV  3.375 g Intravenous Q8H  . valACYclovir  500 mg Oral TID  . vancomycin  500 mg Intravenous Q12H   Continuous Infusions:   . sodium chloride 75 mL/hr at 12/27/12 1837    Active Problems:  Breast cancer  Clotting disorder  DVT (deep venous thrombosis)  SOB (shortness of breath)  Healthcare-associated pneumonia  Hyponatremia  Acute respiratory distress  Hypoxemia  Hyperglycemia    Time spent: > 35 minutes    Penny Pia  [facility] Pager [phone] If 8PM-8AM, please contact night-coverage at [email or web address], password [facility] [place] 12/28/2012, 4:47 PM  LOS: 1 day

## 2012-12-28 NOTE — Progress Notes (Addendum)
Shift event: RN paged this NP 2/2 pt having increased tachypnea with O2 sats 88-93% on Venturi mask at 50%. ABG obtained which showed lower PO2 than before. Bipap ordered. NP to bedside. Bipap and reason for explained to pt. When asked about intubation she agreed to this if necessary. She is a FULL CODE. Will r/p ABG 2 hours after bipap started. If PO2 no better, or pt's status worsens in the meantime, will call PCCM consult.  Jessica Norman, NP [facility] Update: r/p ABG much improved. Doing well on BIPAP. Cont present plan of care.  KKG, NP

## 2012-12-29 ENCOUNTER — Ambulatory Visit: Payer: BLUE CROSS/BLUE SHIELD | Admitting: Internal Medicine

## 2012-12-29 ENCOUNTER — Telehealth (INDEPENDENT_AMBULATORY_CARE_PROVIDER_SITE_OTHER): Payer: Self-pay

## 2012-12-29 ENCOUNTER — Encounter (INDEPENDENT_AMBULATORY_CARE_PROVIDER_SITE_OTHER): Payer: BLUE CROSS/BLUE SHIELD | Admitting: General Surgery

## 2012-12-29 DIAGNOSIS — J9600 Acute respiratory failure, unspecified whether with hypoxia or hypercapnia: Secondary | ICD-10-CM

## 2012-12-29 DIAGNOSIS — E876 Hypokalemia: Secondary | ICD-10-CM

## 2012-12-29 DIAGNOSIS — J9601 Acute respiratory failure with hypoxia: Secondary | ICD-10-CM

## 2012-12-29 LAB — BASIC METABOLIC PANEL
BUN: 9 mg/dL (ref 6–23)
Calcium: 8.2 mg/dL — ABNORMAL LOW (ref 8.4–10.5)
Creatinine, Ser: 0.52 mg/dL (ref 0.50–1.10)
GFR calc non Af Amer: >90 mL/min (ref >90)
Glucose, Bld: 107 mg/dL — ABNORMAL HIGH (ref 70–99)
Sodium: 136 mEq/L (ref 135–145)

## 2012-12-29 LAB — GLUCOSE, CAPILLARY
Glucose-Capillary: 91 mg/dL (ref 70–99)
Glucose-Capillary: 182 mg/dL — ABNORMAL HIGH (ref 70–99)
Glucose-Capillary: 218 mg/dL — ABNORMAL HIGH (ref 70–99)

## 2012-12-29 LAB — BLOOD GAS, ARTERIAL
Bicarbonate: 24.6 mEq/L — ABNORMAL HIGH (ref 20.0–24.0)
Delivery systems: POSITIVE
FIO2: 0.5 %
O2 Saturation: 97.4 %
Patient temperature: 98.9
TCO2: 23.3 mmol/L (ref 0–100)
pO2, Arterial: 88 mmHg (ref 80.0–100.0)

## 2012-12-29 LAB — HEMOGLOBIN A1C: Hgb A1c MFr Bld: 9.7 % — ABNORMAL HIGH (ref <5.7)

## 2012-12-29 LAB — CBC
HCT: 24.7 % — ABNORMAL LOW (ref 36.0–46.0)
Hemoglobin: 8.6 g/dL — ABNORMAL LOW (ref 12.0–15.0)
MCH: 32.2 pg (ref 26.0–34.0)
MCHC: 34.8 g/dL (ref 30.0–36.0)
MCV: 92.5 fL (ref 78.0–100.0)

## 2012-12-29 MED ORDER — POTASSIUM CHLORIDE 10 MEQ/100ML IV SOLN
10.0000 meq | INTRAVENOUS | Status: AC
  Administered 2012-12-29 (×4): 10 meq via INTRAVENOUS
  Filled 2012-12-29 (×4): qty 100

## 2012-12-29 MED ORDER — KCL IN DEXTROSE-NACL 20-5-0.45 MEQ/L-%-% IV SOLN
INTRAVENOUS | Status: DC
  Administered 2012-12-29: 09:00:00 via INTRAVENOUS
  Administered 2012-12-30: 75 mL/h via INTRAVENOUS
  Filled 2012-12-29 (×3): qty 1000

## 2012-12-29 MED ORDER — OSELTAMIVIR PHOSPHATE 75 MG PO CAPS
75.0000 mg | ORAL_CAPSULE | Freq: Two times a day (BID) | ORAL | Status: DC
  Administered 2012-12-29: 75 mg via ORAL
  Filled 2012-12-29 (×2): qty 1

## 2012-12-29 MED ORDER — ENSURE PUDDING PO PUDG
1.0000 | Freq: Three times a day (TID) | ORAL | Status: DC
  Administered 2012-12-29: 1 via ORAL
  Filled 2012-12-29 (×4): qty 1

## 2012-12-29 NOTE — Telephone Encounter (Signed)
Pre admit called needing orders put in epic. Looks like they were canceled in system.

## 2012-12-29 NOTE — Progress Notes (Signed)
RN called. Concerned with pt increased wob. Pt on BIPAP previous shift-- assessed pt, pt states: breathing feels fine, although upon exam pt breathing in 30's-40's with some crackles bilaterally to auscultation. Explained benefit of wearing BIPAP to pt, pt states doesn't wish to wear at this point please check back later, pt also states feeling confined while wearing BIPAP. Pt 96% on NRB. RN made aware and will notify MD

## 2012-12-29 NOTE — Progress Notes (Signed)
[facility] PROGRESS NOTE  Jessica Wells AVW:[id number] DOB: 1946/01/15 DOA: 12/27/2012 PCP: [name], MD  Assessment/Plan: 1. Pulmonary embolism - In context of patient with history of + prothrombin gene mutation-heterozygous and breast cancer - Pt is s/p IVC filter placement - Continue full dose lovenox - Will continue to monitor in step down unit given tachypnea and need for bipap.  Patient feels better at this point.  2. PNA, febrile overnight and increasing oxygen requirements - Continue current antibiotic regimen -  Start tamiflu -  F/u flu ag - Legionella/strep urine antigen negative.  3. Acute hypoxic respiratory failure, likely due to PNA and PE. - Bipap prn - PCCM following via E-link  5. Hyperglycemia, resolved - Continue SSI - hemoglobin a1c pending  6. Breast Cancer - follow by Dr. Welton Flakes as outpatient - Patient supposedly scheduled for mastectomy as outpatient.  Dr. Dwain Sarna supposedly followed patient as outpatient.  Hypokalemia:  Likely due to poor PO intake -  Add to IVF and replete with IV  DIET:  Healthy heart ACCESS:  Port IVF:  D5 1/2 NS with 20 kcl at 77ml/h PROPH:  Therapeutic lovenox  Code Status:  full code Family Communication: No family at bedside Disposition Plan: Pending further improvement in respiratory condition   Consultants:  None  Case discussed with oncology initially  Procedures:  IVC filter placement  Antibiotics:  zosyn and vancomycin 1/27 >>  Also on valacyclovir 1/28 >>  Tamiflu 1/29 >>  HPI/Subjective: Patient reports feeling better, however, due to tachypnea and SOB required bipap overnight. No new complaints.  Denies wheeze, cough.  Has mild nausea and is not eating very well.  Denies URI symptoms and diarrhea.    Objective: Filed Vitals:   12/28/12 2351 12/29/12 0000 12/29/12 0400 12/29/12 0408  BP: 130/65  107/70   Pulse: 112  88 89  Temp:  98.9 F (37.2 C)  97.5 F (36.4 C)    TempSrc:  Axillary  Oral  Resp: 41  28 28  Height:      Weight:      SpO2: 96%  94% 95%    Intake/Output Summary (Last 24 hours) at 12/29/12 0759 Last data filed at 12/29/12 0600  Gross per 24 hour  Intake 2397.5 ml  Output   1200 ml  Net 1197.5 ml   Filed Weights   12/27/12 1710 12/27/12 2325  Weight: 67 kg (147 lb 11.3 oz) 68.2 kg (150 lb 5.7 oz)    Exam:   General:  CF, Alert Awake sitting up smiling, moderate tachypnea while on face mask  HEENT:  MMM  Cardiovascular: Tachycardic regular rhythm, No MRG  Chest:  Port accessed in left chest with dressing coming off slightly  Respiratory: increased respiratory rate, no wheezes, Course bilateral anterior rales.  Bronchial BS on the left base with rales and no rales at the left base, but heard bilaterally at the mid and upper back.  Pops and Eugean Arnott scattered wheezes bilaterally.  No rhonchi.    Abdomen: soft, NT, ND  MSK:  1+ RLE, 2+ LLE pitting edema  Data Reviewed: Basic Metabolic Panel:  Lab 12/29/12 8295 12/27/12 2204 12/27/12 1136 12/27/12 1048  NA 136 131* 124* 126*  K 3.1* 3.6 3.9 4.0  CL 100 98 90* 92*  CO2 25 24 23 24   GLUCOSE 107* 153* 281* 288*  BUN 9 10 14  14.7  CREATININE 0.52 0.52 0.59 1.1  CALCIUM 8.2* 7.5* 8.2* 8.7  MG -- -- -- --  PHOS -- -- -- --  Liver Function Tests:  Lab 12/27/12 1136  AST 39*  ALT 36*  ALKPHOS 62  BILITOT 0.7  PROT 5.9*  ALBUMIN 2.3*   No results found for this basename: LIPASE:5,AMYLASE:5 in the last 168 hours No results found for this basename: AMMONIA:5 in the last 168 hours CBC:  Lab 12/29/12 0515 12/27/12 2204 12/27/12 1136 12/27/12 1048 12/22/12 1244  WBC 8.1 6.2 8.6 9.5 6.4  NEUTROABS -- 5.7 7.9* 8.5* 5.5  HGB 8.6* 9.0* 9.9* 10.1* 10.6*  HCT 24.7* 25.2* 28.4* 29.3* 30.5*  MCV 92.5 91.3 92.5 91.6 93.6  PLT 236 154 173 181 98*   Cardiac Enzymes: No results found for this basename: CKTOTAL:5,CKMB:5,CKMBINDEX:5,TROPONINI:5 in the last 168 hours BNP  (last 3 results) No results found for this basename: PROBNP:3 in the last 8760 hours CBG:  Lab 12/29/12 0729 12/28/12 2035 12/28/12 1727 12/28/12 1156 12/28/12 0800  GLUCAP 91 126* 104* 158* 147*    Recent Results (from the past 240 hour(s))  TECHNOLOGIST REVIEW     Status: Normal   Collection Time   12/22/12 12:44 PM      Component Value Range Status Comment   Technologist Review Metas and Myelocytes present   Final   CULTURE, BLOOD (ROUTINE X 2)     Status: Normal (Preliminary result)   Collection Time   12/27/12 11:30 AM      Component Value Range Status Comment   Specimen Description BLOOD RIGHT HAND   Final    Special Requests BOTTLES DRAWN AEROBIC AND ANAEROBIC 5CC   Final    Culture  Setup Time 12/27/2012 21:48   Final    Culture     Final    Value:        BLOOD CULTURE RECEIVED NO GROWTH TO DATE CULTURE WILL BE HELD FOR 5 DAYS BEFORE ISSUING A FINAL NEGATIVE REPORT   Report Status PENDING   Incomplete   CULTURE, BLOOD (ROUTINE X 2)     Status: Normal (Preliminary result)   Collection Time   12/27/12 11:30 AM      Component Value Range Status Comment   Specimen Description BLOOD LEFT HAND   Final    Special Requests BOTTLES DRAWN AEROBIC AND ANAEROBIC 5CC   Final    Culture  Setup Time 12/27/2012 21:48   Final    Culture     Final    Value:        BLOOD CULTURE RECEIVED NO GROWTH TO DATE CULTURE WILL BE HELD FOR 5 DAYS BEFORE ISSUING A FINAL NEGATIVE REPORT   Report Status PENDING   Incomplete   URINE CULTURE     Status: Normal   Collection Time   12/27/12  1:13 PM      Component Value Range Status Comment   Specimen Description URINE, CATHETERIZED   Final    Special Requests NONE   Final    Culture  Setup Time 12/27/2012 22:28   Final    Colony Count 50,000 COLONIES/ML   Final    Culture     Final    Value: Multiple bacterial morphotypes present, none predominant. Suggest appropriate recollection if clinically indicated.   Report Status 12/28/2012 FINAL   Final   MRSA  PCR SCREENING     Status: Normal   Collection Time   12/27/12 11:21 PM      Component Value Range Status Comment   MRSA by PCR NEGATIVE  NEGATIVE Final      Studies: Dg Chest 2 View  12/27/2012  *  RADIOLOGY REPORT*  Clinical Data: Fever with shortness of breath.  History of breast cancer.  CHEST - 2 VIEW  Comparison: 09/15/2012.  Findings: Left subclavian Port-A-Cath tip is unchanged at the SVC right atrial junction.  The heart size and mediastinal contours are stable.  There are new nodular air space opacities in both lungs, right greater than left.  There is no significant pleural effusion. The osseous structures appear unremarkable.  IMPRESSION: New bilateral air space opacities, right greater than left.  In the setting, these likely represent pneumonia.  Radiographic followup is necessary to exclude metastatic disease.   Original Report Authenticated By: Carey Bullocks, M.D.    Ct Angio Chest Pe W/cm &/or Wo Cm  12/27/2012  *RADIOLOGY REPORT*  Clinical Data: Pleuritic chest pain, hypoxemia, tachycardia, history of breast cancer with new diagnosis of DVT, evaluate for pulmonary embolism  CT ANGIOGRAPHY CHEST  Technique:  Multidetector CT imaging of the chest using the standard protocol during bolus administration of intravenous contrast. Multiplanar reconstructed images including MIPs were obtained and reviewed to evaluate the vascular anatomy.  Contrast: OMNIPAQUE IOHEXOL 350 MG/ML SOLN  Comparison: CT chest, abdomen pelvis - 07/26/2012; PET CT - 07/26/2012; chest radiograph - earlier same day  Vascular Findings:  There is suboptimal opacification of the pulmonary arterial system of the main pulmonary artery measuring only 217 HU.  Given this limitation, there is are discrete filling defects within the peripheral aspect of the right main pulmonary artery (image 114, series 5) with nonocclusive thrombus extending into nearly all major segmental branches of the right lung.  There is a there is  nonocclusive thrombus within the left anterior/medial basilar segmental pulmonary artery of the left lower lobe (image 122, series five).  The overall clot burden is felt to be small to moderate.  Caliber of the main pulmonary artery is normal measuring 3 cm in diameter.  There is no definitive bowing of the interventricular septum.  There is no reflux of injected contrast into the hepatic venous system.  Normal heart size.  Trace amount of pericardial fluid, possibly physiologic.  Normal caliber thoracic aorta.  Left anterior chest wall port catheter tip terminates within the distal SVC.  -----------------------------------------------------------  Nonvascular findings:  There has been interval development of rather extensive ground- glass opacities within the bilateral lungs with slight apical predominance.  This represents a marked change from prior chest CT performed 07/26/2012.  No discrete air bronchograms.  Central pulmonary airways are patent.  No pleural effusion or pneumothorax.  Interval development of mediastinal and bilateral hilar adenopathy with index pretracheal node measuring 1.1 cm in Mcdonald Reiling axis diameter (image 26, series 4, right hilar nodal the lung aeration measuring approximately 1.2 cm (image 36) and left hilar nodal conglomeration measuring approximately 0.9 cm (image 27).  Interval resolution of right-sided axillary adenopathy.  Limited early arterial phase evaluation of the upper abdomen suggests decreased attenuation of the hepatic parenchyma suggestive of hepatic steatosis.  Hiatal hernia.  Evaluation of the sternum is degraded secondary to streak artifact and patient motion, however there is a possible nondisplaced fracture involving the mid aspect of the sternum (image 78, series nine).  No discrete aggressive osseous lesions.  Decreased conspicuity of previously noted right-sided retroareolar mass.  IMPRESSION:  1.  Examination is positive for bilateral pulmonary embolism, right  greater than left. Overall clot burden is felt to be small to moderate.  No CT evidence of right-sided heart failure.  2.  Interval development of extensive bilateral heterogeneous opacities with  slight upper lobe predominance.  Differential considerations are broad and include drug toxicity, though note, atypical infection may have a similar appearance. 3.  Interval development of mediastinal bilateral hilar adenopathy, again nonspecific and while possibly reactive, metastatic disease is not excluded.  4.  Interval resolution of right-sided axillary adenopathy and mass like thickening within the right retro alveolar breast.  5. Possible nondisplaced fracture of the sternum, possibly artifactual due to respiratory artifact.  Correlation for point tenderness at this location is recommended.  No discrete associated aggressive osseous lesion.  Above findings discussed with Dr. Lorenso Courier at [phone].   Original Report Authenticated By: Tacey Ruiz, MD    Ir Ivc Filter Plmt / S&i /img Guid/mod Sed  12/28/2012  *RADIOLOGY REPORT*  Clinical Data:  Lower extremity DVT, pulmonary emboli, breast cancer  ULTRASOUND GUIDANCE FOR VASCULAR ACCESS IVC CATHETERIZATION AND VENOGRAM IVC FILTER INSERTION  Date:  12/27/2012 17:40:00  Radiologist:  M. Ruel Favors, M.D.  Medications:  1 mg Versed, 50 mcg Fentanyl  Guidance:  Ultrasound fluoroscopic  Fluoroscopy time:  1.8 minutes  Sedation time:  30 minutes  Contrast Volume:  40 ml Omnipaque-300  Complications:  No immediate  PROCEDURE/FINDINGS:  Informed consent was obtained from the patient following explanation of the procedure, risks, benefits and alternatives. The patient understands, agrees and consents for the procedure. All questions were addressed.  A time out was performed.  Maximal barrier sterile technique utilized including caps, mask, sterile gowns, sterile gloves, large sterile drape, hand hygiene, and betadine prep.  Under sterile condition and local anesthesia, right  internal jugular venous access was performed with ultrasound.  Over a guide wire, the IVC filter delivery sheath and inner dilator were advanced into the IVC just above the IVC bifurcation.  Contrast injection was performed for an IVC venogram.  IVC VENOGRAM:  The IVC is patent.  No evidence of thrombus, stenosis, or occlusion.  No variant venous anatomy.  The renal veins are identified at L1-2.  IVC FILTER INSERTION:  Through the delivery sheath, the Cordis Celect IVC filter was deployed in the infrarenal IVC at the L3 level just below the renal veins and above the IVC bifurcation. Contrast injection confirmed position.  There is good apposition of the filter against the IVC.  The delivery sheath was removed and hemostasis was obtained with compression for 5 minutes.  The patient tolerated the procedure well.  No immediate complications.  IMPRESSION: Ultrasound and fluoroscopically guided infrarenal IVC filter insertion.   Original Report Authenticated By: Judie Petit. Shick, M.D.     Scheduled Meds:    . enoxaparin (LOVENOX) injection  70 mg Subcutaneous Q12H  . fluconazole  200 mg Oral Daily  . insulin aspart  0-5 Units Subcutaneous QHS  . insulin aspart  0-9 Units Subcutaneous TID WC  . metoprolol succinate  50 mg Oral Daily  . oseltamivir  75 mg Oral BID  . pantoprazole  40 mg Oral Daily  . piperacillin-tazobactam (ZOSYN)  IV  3.375 g Intravenous Q8H  . potassium chloride  10 mEq Intravenous Q1 Hr x 4  . valACYclovir  500 mg Oral TID  . vancomycin  500 mg Intravenous Q12H   Continuous Infusions:    . dextrose 5 % and 0.45 % NaCl with KCl 20 mEq/L      Principal Problem:  *Pulmonary embolism Active Problems:  Breast cancer  Clotting disorder  DVT (deep venous thrombosis)  SOB (shortness of breath)  Healthcare-associated pneumonia  Hyponatremia  Acute respiratory distress  Hypoxemia  Hyperglycemia    Time spent: > 35 minutes    Bensen Chadderdon  [facility] Pager [phone]  If 7PM-7AM, please contact night-coverage at [email or web address], password [facility] 12/29/2012, 7:59 AM  LOS: 2 days

## 2012-12-30 ENCOUNTER — Encounter (HOSPITAL_COMMUNITY): Payer: Self-pay | Admitting: Anesthesiology

## 2012-12-30 ENCOUNTER — Inpatient Hospital Stay (HOSPITAL_COMMUNITY): Payer: Medicare Other

## 2012-12-30 ENCOUNTER — Encounter (HOSPITAL_COMMUNITY): Payer: Self-pay | Admitting: Pulmonary Disease

## 2012-12-30 ENCOUNTER — Inpatient Hospital Stay (HOSPITAL_COMMUNITY): Admission: RE | Admit: 2012-12-30 | Payer: Medicare Other | Source: Ambulatory Visit

## 2012-12-30 DIAGNOSIS — J45909 Unspecified asthma, uncomplicated: Secondary | ICD-10-CM

## 2012-12-30 DIAGNOSIS — I471 Supraventricular tachycardia, unspecified: Secondary | ICD-10-CM

## 2012-12-30 LAB — BLOOD GAS, ARTERIAL
Acid-Base Excess: 3 mmol/L — ABNORMAL HIGH (ref 0.0–2.0)
Bicarbonate: 27.8 mEq/L — ABNORMAL HIGH (ref 20.0–24.0)
Drawn by: 235901
FIO2: 0.7 %
MECHVT: 320 mL
MECHVT: 300 mL
O2 Saturation: 98.8 %
O2 Saturation: 96.6 %
PEEP: 8 cmH2O
PEEP: 10 cmH2O
Patient temperature: 98.6
Patient temperature: 98.6
RATE: 25 resp/min
RATE: 35 resp/min
RATE: 35 resp/min
pCO2 arterial: 59.2 mmHg (ref 35.0–45.0)
pH, Arterial: 7.293 — ABNORMAL LOW (ref 7.350–7.450)
pO2, Arterial: 110 mmHg — ABNORMAL HIGH (ref 80.0–100.0)
pO2, Arterial: 69 mmHg — ABNORMAL LOW (ref 80.0–100.0)

## 2012-12-30 LAB — CBC
HCT: 22.5 % — ABNORMAL LOW (ref 36.0–46.0)
HCT: 23.4 % — ABNORMAL LOW (ref 36.0–46.0)
Hemoglobin: 7.7 g/dL — ABNORMAL LOW (ref 12.0–15.0)
MCHC: 34.2 g/dL (ref 30.0–36.0)
MCHC: 33.8 g/dL (ref 30.0–36.0)
MCV: 94.7 fL (ref 78.0–100.0)
Platelets: 292 10*3/uL (ref 150–400)
RDW: 16.3 % — ABNORMAL HIGH (ref 11.5–15.5)
WBC: 7.8 10*3/uL (ref 4.0–10.5)
WBC: 8.3 10*3/uL (ref 4.0–10.5)

## 2012-12-30 LAB — TYPE AND SCREEN

## 2012-12-30 LAB — GLUCOSE, CAPILLARY
Glucose-Capillary: 147 mg/dL — ABNORMAL HIGH (ref 70–99)
Glucose-Capillary: 154 mg/dL — ABNORMAL HIGH (ref 70–99)
Glucose-Capillary: 176 mg/dL — ABNORMAL HIGH (ref 70–99)
Glucose-Capillary: 231 mg/dL — ABNORMAL HIGH (ref 70–99)

## 2012-12-30 LAB — URINALYSIS, ROUTINE W REFLEX MICROSCOPIC
Leukocytes, UA: NEGATIVE
Nitrite: NEGATIVE
Specific Gravity, Urine: 1.007 (ref 1.005–1.030)
Urobilinogen, UA: 0.2 mg/dL (ref 0.0–1.0)
pH: 5.5 (ref 5.0–8.0)

## 2012-12-30 LAB — BASIC METABOLIC PANEL
Chloride: 98 mEq/L (ref 96–112)
GFR calc Af Amer: >90 mL/min (ref >90)
GFR calc non Af Amer: >90 mL/min (ref >90)
Glucose, Bld: 196 mg/dL — ABNORMAL HIGH (ref 70–99)
Potassium: 3.6 mEq/L (ref 3.5–5.1)
Sodium: 133 mEq/L — ABNORMAL LOW (ref 135–145)

## 2012-12-30 LAB — PROTEIN / CREATININE RATIO, URINE: Total Protein, Urine: 5.2 mg/dL

## 2012-12-30 LAB — ABO/RH: ABO/RH(D): A POS

## 2012-12-30 MED ORDER — SUCCINYLCHOLINE CHLORIDE 20 MG/ML IJ SOLN
INTRAMUSCULAR | Status: DC | PRN
  Administered 2012-12-30: 100 mg via INTRAVENOUS

## 2012-12-30 MED ORDER — FUROSEMIDE 10 MG/ML IJ SOLN
INTRAMUSCULAR | Status: AC
  Administered 2012-12-30: 20 mg
  Filled 2012-12-30: qty 4

## 2012-12-30 MED ORDER — VANCOMYCIN HCL 1000 MG IV SOLR
750.0000 mg | Freq: Two times a day (BID) | INTRAVENOUS | Status: DC
  Administered 2012-12-30 – 2013-01-01 (×4): 750 mg via INTRAVENOUS
  Filled 2012-12-30 (×5): qty 750

## 2012-12-30 MED ORDER — INSULIN ASPART 100 UNIT/ML ~~LOC~~ SOLN
0.0000 [IU] | SUBCUTANEOUS | Status: DC
  Administered 2012-12-30: 5 [IU] via SUBCUTANEOUS
  Administered 2012-12-30: 2 [IU] via SUBCUTANEOUS
  Administered 2012-12-30: 3 [IU] via SUBCUTANEOUS
  Administered 2012-12-31: 8 [IU] via SUBCUTANEOUS
  Administered 2012-12-31 (×2): 5 [IU] via SUBCUTANEOUS
  Administered 2012-12-31 (×2): 8 [IU] via SUBCUTANEOUS
  Administered 2013-01-01 (×3): 11 [IU] via SUBCUTANEOUS
  Administered 2013-01-01: 2 [IU] via SUBCUTANEOUS
  Administered 2013-01-01: 11 [IU] via SUBCUTANEOUS
  Administered 2013-01-01: 3 [IU] via SUBCUTANEOUS
  Administered 2013-01-01: 11 [IU] via SUBCUTANEOUS
  Administered 2013-01-02 (×2): 5 [IU] via SUBCUTANEOUS
  Administered 2013-01-02: 3 [IU] via SUBCUTANEOUS
  Administered 2013-01-02: 5 [IU] via SUBCUTANEOUS
  Administered 2013-01-02: 11 [IU] via SUBCUTANEOUS
  Administered 2013-01-03: 5 [IU] via SUBCUTANEOUS
  Administered 2013-01-03: 3 [IU] via SUBCUTANEOUS

## 2012-12-30 MED ORDER — SODIUM CHLORIDE 0.9 % IV SOLN
25.0000 ug/h | INTRAVENOUS | Status: DC
  Administered 2012-12-30: 25 ug/h via INTRAVENOUS
  Administered 2013-01-01: 50 ug/h via INTRAVENOUS
  Filled 2012-12-30 (×2): qty 50

## 2012-12-30 MED ORDER — MORPHINE SULFATE 2 MG/ML IJ SOLN
1.0000 mg | INTRAMUSCULAR | Status: DC | PRN
  Administered 2012-12-30: 1 mg via INTRAVENOUS
  Filled 2012-12-30: qty 1

## 2012-12-30 MED ORDER — FENTANYL CITRATE 0.05 MG/ML IJ SOLN
INTRAMUSCULAR | Status: AC
  Administered 2012-12-30: 100 ug
  Filled 2012-12-30: qty 4

## 2012-12-30 MED ORDER — FENTANYL CITRATE 0.05 MG/ML IJ SOLN: 25.0000 ug | INTRAMUSCULAR | Status: DC | PRN

## 2012-12-30 MED ORDER — CHLORHEXIDINE GLUCONATE 0.12 % MT SOLN
OROMUCOSAL | Status: AC
  Administered 2012-12-30: 15 mL
  Filled 2012-12-30: qty 15

## 2012-12-30 MED ORDER — PROPOFOL 10 MG/ML IV BOLUS
INTRAVENOUS | Status: DC | PRN
  Administered 2012-12-30: 100 mg via INTRAVENOUS

## 2012-12-30 MED ORDER — MIDAZOLAM HCL 5 MG/ML IJ SOLN
1.0000 mg | INTRAMUSCULAR | Status: DC | PRN
  Administered 2012-12-30: 1 mg via INTRAVENOUS
  Administered 2012-12-31 – 2013-01-02 (×8): 2 mg via INTRAVENOUS
  Filled 2012-12-30 (×8): qty 1

## 2012-12-30 MED ORDER — FUROSEMIDE 10 MG/ML IJ SOLN: 20.0000 mg | Freq: Once | INTRAMUSCULAR | Status: DC

## 2012-12-30 MED ORDER — METHYLPREDNISOLONE SODIUM SUCC 125 MG IJ SOLR
125.0000 mg | Freq: Four times a day (QID) | INTRAMUSCULAR | Status: DC
  Administered 2012-12-30 – 2013-01-02 (×12): 125 mg via INTRAVENOUS
  Filled 2012-12-30 (×16): qty 2

## 2012-12-30 MED ORDER — METHYLPREDNISOLONE SODIUM SUCC 125 MG IJ SOLR
60.0000 mg | Freq: Two times a day (BID) | INTRAMUSCULAR | Status: DC
  Filled 2012-12-30 (×2): qty 0.96

## 2012-12-30 MED ORDER — MIDAZOLAM HCL 5 MG/ML IJ SOLN
INTRAMUSCULAR | Status: AC
  Administered 2012-12-30: 2 mg
  Filled 2012-12-30: qty 1

## 2012-12-30 MED ORDER — BIOTENE DRY MOUTH MT LIQD
15.0000 mL | Freq: Four times a day (QID) | OROMUCOSAL | Status: DC
  Administered 2012-12-31 (×2): 15 mL via OROMUCOSAL

## 2012-12-30 MED ORDER — INSULIN ASPART 100 UNIT/ML ~~LOC~~ SOLN
0.0000 [IU] | Freq: Three times a day (TID) | SUBCUTANEOUS | Status: DC
  Administered 2012-12-30: 3 [IU] via SUBCUTANEOUS

## 2012-12-30 MED ORDER — LEVALBUTEROL HCL 1.25 MG/0.5ML IN NEBU
1.2500 mg | INHALATION_SOLUTION | Freq: Four times a day (QID) | RESPIRATORY_TRACT | Status: DC
  Administered 2012-12-30 – 2013-01-04 (×19): 1.25 mg via RESPIRATORY_TRACT
  Filled 2012-12-30 (×27): qty 0.5

## 2012-12-30 MED ORDER — LEVOFLOXACIN IN D5W 750 MG/150ML IV SOLN
750.0000 mg | INTRAVENOUS | Status: DC
  Administered 2012-12-30 – 2013-01-02 (×4): 750 mg via INTRAVENOUS
  Filled 2012-12-30 (×4): qty 150

## 2012-12-30 MED ORDER — MIDAZOLAM HCL 5 MG/ML IJ SOLN
3.0000 mg | Freq: Once | INTRAMUSCULAR | Status: AC
  Administered 2012-12-30: 3 mg via INTRAVENOUS

## 2012-12-30 MED ORDER — MIDAZOLAM HCL 5 MG/ML IJ SOLN
INTRAMUSCULAR | Status: AC
  Administered 2012-12-30: 3 mg
  Filled 2012-12-30: qty 1

## 2012-12-30 MED ORDER — CHLORHEXIDINE GLUCONATE 0.12 % MT SOLN
15.0000 mL | Freq: Two times a day (BID) | OROMUCOSAL | Status: DC
  Administered 2012-12-30 – 2013-01-05 (×11): 15 mL via OROMUCOSAL
  Filled 2012-12-30 (×14): qty 15

## 2012-12-30 MED ORDER — FENTANYL BOLUS VIA INFUSION
25.0000 ug | Freq: Four times a day (QID) | INTRAVENOUS | Status: DC | PRN
  Filled 2012-12-30: qty 100

## 2012-12-30 NOTE — Progress Notes (Addendum)
ANTIBIOTIC CONSULT NOTE - FOLLOW UP  Pharmacy Consult for Vancomycin/Zosyn, Levaquin added 1/30 Indication: Sepsis/PNA  Allergies  Allergen Reactions  . Anesthetics, Amide Nausea And Vomiting    Projectile vomitting-Nausea- with 24hrs of dry heaves. With any anesthetics  . Bactrim (Sulfamethoxazole W-Trimethoprim) Other (See Comments)    Massive diarrhea, nausea vomiting, platelets dropped, hemoglobin dropped, admitted to hospital  . Sulfa Antibiotics Nausea And Vomiting and Other (See Comments)    Crazy feeling in head Life threatening reaction, decreased blood counts  . Codeine Nausea And Vomiting  . Codeine Phosphate Nausea And Vomiting  . Eggs Or Egg-Derived Products Hives   Patient Measurements: Height: 5' 1.5" (156.2 cm) Weight: 150 lb 5.7 oz (68.2 kg) IBW/kg (Calculated) : 48.95   Vital Signs: Temp: 100.2 F (37.9 C) (01/30 0345) Temp src: Axillary (01/30 0345) BP: 107/65 mmHg (01/30 0606) Pulse Rate: 117  (01/30 0606) Intake/Output from previous day: 01/29 0701 - 01/30 0700 In: 2087.5 [I.V.:1650; IV Piggyback:437.5] Out: 2450 [Urine:2450] Intake/Output from this shift:    Labs:  Basename 12/30/12 0355 12/29/12 0515 12/27/12 2204  WBC 7.8 8.1 6.2  HGB 7.7* 8.6* 9.0*  PLT 255 236 154  LABCREA -- -- --  CREATININE 0.48* 0.52 0.52   Estimated Creatinine Clearance: 61.1 ml/min (by C-G formula based on Cr of 0.48). No results found for this basename: VANCOTROUGH:2,VANCOPEAK:2,VANCORANDOM:2,GENTTROUGH:2,GENTPEAK:2,GENTRANDOM:2,TOBRATROUGH:2,TOBRAPEAK:2,TOBRARND:2,AMIKACINPEAK:2,AMIKACINTROU:2,AMIKACIN:2, in the last 72 hours   Microbiology: Recent Results (from the past 720 hour(s))  TECHNOLOGIST REVIEW     Status: Normal   Collection Time   12/17/12 10:49 AM      Component Value Range Status Comment   Technologist Review Metas and Myelocytes present, 6% nrbcs present   Final   TECHNOLOGIST REVIEW     Status: Normal   Collection Time   12/22/12 12:44 PM   Component Value Range Status Comment   Technologist Review Metas and Myelocytes present   Final   CULTURE, BLOOD (ROUTINE X 2)     Status: Normal (Preliminary result)   Collection Time   12/27/12 11:30 AM      Component Value Range Status Comment   Specimen Description BLOOD RIGHT HAND   Final    Special Requests BOTTLES DRAWN AEROBIC AND ANAEROBIC 5CC   Final    Culture  Setup Time 12/27/2012 21:48   Final    Culture     Final    Value:        BLOOD CULTURE RECEIVED NO GROWTH TO DATE CULTURE WILL BE HELD FOR 5 DAYS BEFORE ISSUING A FINAL NEGATIVE REPORT   Report Status PENDING   Incomplete   CULTURE, BLOOD (ROUTINE X 2)     Status: Normal (Preliminary result)   Collection Time   12/27/12 11:30 AM      Component Value Range Status Comment   Specimen Description BLOOD LEFT HAND   Final    Special Requests BOTTLES DRAWN AEROBIC AND ANAEROBIC 5CC   Final    Culture  Setup Time 12/27/2012 21:48   Final    Culture     Final    Value:        BLOOD CULTURE RECEIVED NO GROWTH TO DATE CULTURE WILL BE HELD FOR 5 DAYS BEFORE ISSUING A FINAL NEGATIVE REPORT   Report Status PENDING   Incomplete   URINE CULTURE     Status: Normal   Collection Time   12/27/12  1:13 PM      Component Value Range Status Comment   Specimen Description URINE, CATHETERIZED  Final    Special Requests NONE   Final    Culture  Setup Time 12/27/2012 22:28   Final    Colony Count 50,000 COLONIES/ML   Final    Culture     Final    Value: Multiple bacterial morphotypes present, none predominant. Suggest appropriate recollection if clinically indicated.   Report Status 12/28/2012 FINAL   Final   MRSA PCR SCREENING     Status: Normal   Collection Time   12/27/12 11:21 PM      Component Value Range Status Comment   MRSA by PCR NEGATIVE  NEGATIVE Final     Anti-infectives     Start     Dose/Rate Route Frequency Ordered Stop   12/30/12 0900   levofloxacin (LEVAQUIN) IVPB 750 mg        750 mg 100 mL/hr over 90 Minutes  Intravenous Every 24 hours 12/30/12 0829     12/29/12 1000   oseltamivir (TAMIFLU) capsule 75 mg  Status:  Discontinued        75 mg Oral 2 times daily 12/29/12 0704 12/29/12 2001   12/28/12 0200   vancomycin (VANCOCIN) 500 mg in sodium chloride 0.9 % 100 mL IVPB        500 mg 100 mL/hr over 60 Minutes Intravenous Every 12 hours 12/27/12 1239     12/27/12 2200  piperacillin-tazobactam (ZOSYN) IVPB 3.375 g       3.375 g 12.5 mL/hr over 240 Minutes Intravenous Every 8 hours 12/27/12 1248     12/27/12 2200   valACYclovir (VALTREX) tablet 500 mg        500 mg Oral 3 times daily 12/27/12 1709     12/27/12 2200   fluconazole (DIFLUCAN) tablet 200 mg  Status:  Discontinued        200 mg Oral Daily 12/27/12 1709 12/30/12 0828   12/27/12 2000   piperacillin-tazobactam (ZOSYN) IVPB 3.375 g  Status:  Discontinued        3.375 g 12.5 mL/hr over 240 Minutes Intravenous Every 8 hours 12/27/12 1239 12/27/12 1248   12/27/12 1330   levofloxacin (LEVAQUIN) IVPB 750 mg        750 mg 100 mL/hr over 90 Minutes Intravenous  Once 12/27/12 1324 12/27/12 1525   12/27/12 1300   vancomycin (VANCOCIN) 750 mg in sodium chloride 0.9 % 150 mL IVPB        750 mg 150 mL/hr over 60 Minutes Intravenous  Once 12/27/12 1239 12/27/12 1436   12/27/12 1300  piperacillin-tazobactam (ZOSYN) IVPB 3.375 g       3.375 g 100 mL/hr over 30 Minutes Intravenous  Once 12/27/12 1239 12/28/12 1531         Assessment: 66 YOF with newly diagnosed inflammatory breast cancer sent from cancer center 1/27 with presumed acute PE confirmed on CT. Was recently started on full-dose lovenox for bilateral DVTs and continued. IVC filter placed 1/28.  Vancomycin/Zosyn begun 1/27 for possible sepsis.  Increased work of breathing, worsening CXray with RUL consolidation. Add Levaquin, treating for HCAP. Levaquin 750mg  IV x1 given 1/27.  Goal of Therapy:  Vancomycin trough level 15-20 mcg/ml  Plan:  Levaquin 750mg  IV q24hr Continue  Vancomycin 500mg  IV q12, Zosyn 3.375gm q8hr Vancomycin trough today prior to 7th dose.  Otho Bellows PharmD Pager [phone] 12/30/2012,8:32 AM   Addendum: 1400  Vancomycin trough < 5 mcg/ml on 500mg  q12 hr  Aiming for trough 15-20, will increase dose to 750mg  q12hr with next dose at  1400.  Otho Bellows PharmD 12/30/2012 1:57 PM

## 2012-12-30 NOTE — Consult Note (Signed)
PULMONARY  / CRITICAL CARE MEDICINE  Name: Jessica Wells MRN: [id number] DOB: November 12, 1946    ADMISSION DATE:  12/27/2012 CONSULTATION DATE:  12/30/12   REFERRING MD :  Dr. Malachi Bonds - [facility]  CHIEF COMPLAINT:  Acute Respiratory Failure  BRIEF PATIENT DESCRIPTION: 67 y/o F, former smoker, with PMH of GERD, Asthma, inflammatory breast cancer who completed 22 weeks of Gemzar & Herceptin on 1/17 (followed by Dr. Welton Flakes).  Diagnosed with DVT on 1/24 and Rx'd with lovenox. Coag panel was positive for prothrombin gene mutation - heterozygous.   Seen in cancer center on 1/27 and was noted to be grey with decreased saturations to 70% on RA. Transferred to [facility] ER with CTA positive for PE (small / mod clot burden) and IVC filter was placed.  Significant diffuse bilateral infiltrates on CT noted. 1/30 pt with continued tachypnea, hypoxemia despite BiPAP and PCCM consulted for respiratory failure.    SIGNIFICANT EVENTS / STUDIES:  1/27 - Admit with 3 day hx worsening shortness of breath from cancer center, decreased energy.  Denies flu-like symptoms  1/27 CTA CHEST>>>positive for bilateral pulmonary embolism, right greater than left. Overall clot burden is felt to be small to  moderate. No CT evidence of right-sided heart failure. Interval development of extensive bilateral heterogeneous opacities with slight upper lobe predominance. Interval development of mediastinal bilateral hilar adenopathy, again nonspecific and while possibly reactive, metastatic disease is not excluded. Interval resolution of right-sided axillary adenopathy and mass like thickening within the right retro alveolar breast.  Possible nondisplaced fracture of the sternum, possibly artifactual due to respiratory artifact.  No discrete associated aggressive osseous lesion.  1/27 ECHO>>>EF 60-65%  LINES / TUBES: OETT 1/30>>> L port-a-cath >>   CULTURES: 1/27 MRSA PCR>>>neg 1/27 BCx2>>> 1/27 UC>>>50k multiple morphotypes  ANTIBIOTICS: Levaquin  1/27>>> Zosyn 1/27>>> Vanco 1/27>>>  HISTORY OF PRESENT ILLNESS: 67 y/o F, former smoker, with PMH of GERD, Asthma, inflammatory breast cancer (Dx'd in 08/2012) who completed 22 weeks of Gemzar & Herceptin on 1/17 (followed by Dr. Welton Flakes).  Diagnosed with DVT on 1/24 and Rx'd with lovenox. Coag panel was completed at time of diagnosis of DVT and was positive for prothrombin gene mutation - heterozygous.   Reports 1/24 began feeling poorly, increased shortness of breath and fatigue.  Chills but no fevers.  Denies cough, sputum production, hemoptysis, skin lesions / rash.  LLE swelling but DVT positive.  Seen in cancer center on 1/27 and was noted to be grey with decreased saturations to 70% on RA. Transferred to [facility] ER with CTA positive for PE (small / mod clot burden) and IVC filter was placed.  Significant diffuse bilateral infiltrates on CT noted. 1/30 pt with continued tachypnea, hypoxemia despite BiPAP and PCCM consulted for respiratory failure.  ECHO with EF 60-65%  1/27.    PAST MEDICAL HISTORY :  Past Medical History  Diagnosis Date  . Asthma   . GERD (gastroesophageal reflux disease)   . Dysrhythmia     PAT-sees dr Laurence Compton meds  . Breast cancer 07/14/12    inflammatory right breast ca, ER/PR -  . Allergy   . PAT (paroxysmal atrial tachycardia)     hx  . Arthritis     osteopenia,knees  . PONV (postoperative nausea and vomiting) 09-13-12    severe, with Port-a-cath, was managed without PONV  . Clotting disorder     prothrombin gene mutation-heterozygous   . History of chemotherapy     Last dose 12/17/12.  Was rx'd with Herceptin &  Gemzar  . DVT (deep venous thrombosis)    Past Surgical History  Procedure Date  . Dilation and curettage of uterus     x3  . Tonsillectomy   . Colonoscopy   . Portacath placement 07/21/2012    Procedure: INSERTION PORT-A-CATH;  Surgeon: Emelia Loron, MD;  Location: [facility];  Service: General;  Laterality: Left;   Prior to  Admission medications   Medication Sig Start Date End Date Taking? Authorizing Provider  albuterol (PROVENTIL HFA;VENTOLIN HFA) 108 (90 BASE) MCG/ACT inhaler Inhale 2 puffs into the lungs every 6 (six) hours as needed.   Yes Historical Provider, MD  Alum & Mag Hydroxide-Simeth (MAGIC MOUTHWASH W/LIDOCAINE) SOLN Take 5 mLs by mouth 4 (four) times daily as needed. 10/08/12  Yes Augustin Schooling, NP  b complex vitamins tablet Take 1 tablet by mouth daily.   Yes Historical Provider, MD  calcium-vitamin D (OSCAL WITH D) 500-200 MG-UNIT per tablet Take 1 tablet by mouth daily.   Yes Historical Provider, MD  enoxaparin (LOVENOX) 80 MG/0.8ML injection Inject 0.7 mLs (70 mg total) into the skin every 12 (twelve) hours. 12/22/12  Yes Augustin Schooling, NP  fish oil-omega-3 fatty acids 1000 MG capsule Take 2 g by mouth daily.   Yes Historical Provider, MD  Flaxseed, Linseed, (FLAX SEEDS PO) Take 1 tablet by mouth daily.   Yes Historical Provider, MD  fluconazole (DIFLUCAN) 200 MG tablet Take 1 tablet (200 mg total) by mouth daily. 11/26/12  Yes Augustin Schooling, NP  fluticasone (FLONASE) 50 MCG/ACT nasal spray Place 1 spray into the nose daily as needed. For allergies   Yes Historical Provider, MD  glucosamine-chondroitin 500-400 MG tablet Take 1 tablet by mouth daily.   Yes Historical Provider, MD  lidocaine-prilocaine (EMLA) cream Apply topically as needed. 07/22/12 07/22/13 Yes Victorino December, MD  metoprolol succinate (TOPROL XL) 50 MG 24 hr tablet Take 1 tablet (50 mg total) by mouth daily. Take with or immediately following a meal. 10/14/12  Yes Amy D Clegg, NP  Multiple Vitamin (MULTIVITAMIN WITH MINERALS) TABS Take 1 tablet by mouth daily.   Yes Historical Provider, MD  omeprazole (PRILOSEC) 20 MG capsule Take 20 mg by mouth daily as needed. For heartburn   Yes Historical Provider, MD  ondansetron (ZOFRAN) 8 MG tablet Take 8 mg by mouth every 8 (eight) hours as needed.  09/20/12  Yes Historical Provider,  MD  oxyCODONE-acetaminophen (ROXICET) 5-325 MG per tablet Take 1 tablet by mouth every 4 (four) hours as needed for pain. 09/15/12  Yes Emelia Loron, MD  valACYclovir (VALTREX) 500 MG tablet Take 1 tablet (500 mg total) by mouth 3 (three) times daily. 08/13/12 08/13/13 Yes Victorino December, MD   Allergies  Allergen Reactions  . Anesthetics, Amide Nausea And Vomiting    Projectile vomitting-Nausea- with 24hrs of dry heaves. With any anesthetics  . Bactrim (Sulfamethoxazole W-Trimethoprim) Other (See Comments)    Massive diarrhea, nausea vomiting, platelets dropped, hemoglobin dropped, admitted to hospital  . Sulfa Antibiotics Nausea And Vomiting and Other (See Comments)    Crazy feeling in head Life threatening reaction, decreased blood counts  . Codeine Nausea And Vomiting  . Codeine Phosphate Nausea And Vomiting  . Eggs Or Egg-Derived Products Hives    FAMILY HISTORY:  Family History  Problem Relation Age of Onset  . Diabetes Mother   . Heart disease Mother   . Hypertension Mother   . Hyperlipidemia Mother   . Breast cancer Mother 67  lobular breast cancer  . Heart disease Maternal Aunt   . Heart disease Maternal Uncle   . Heart disease Maternal Grandmother     died in her 28s from heart disease  . Lung cancer Father 63    adenocarcinoma  . Cancer Paternal Uncle     dx in mid 32s with a cancer in the leg, died late 51s; grandpaternal half uncle   SOCIAL HISTORY:  reports that she quit smoking about 33 years ago. Her smoking use included Cigarettes. She has a 35 pack-year smoking history. She has never used smokeless tobacco. She reports that she does not drink alcohol or use illicit drugs.  REVIEW OF SYSTEMS:  Pt with significant work of breathing.  Reports shortness of breath, dry mouth, fatigue.  Denies fevers, chills, cough, sputum production, chest pain, n/v/d.    SUBJECTIVE: Pt reports increased shortness of breath.    VITAL SIGNS: Temp:  [97.6 F (36.4 C)-100.9  F (38.3 C)] 99.5 F (37.5 C) (01/30 0800) Pulse Rate:  [109-135] 117  (01/30 0606) Resp:  [26-50] 50  (01/30 0606) BP: (107-141)/(62-81) 107/65 mmHg (01/30 0606) SpO2:  [92 %-100 %] 94 % (01/30 0606) FiO2 (%):  [60 %-70 %] 60 % (01/30 0459)  HEMODYNAMICS:    VENTILATOR SETTINGS: Vent Mode:  [-]  FiO2 (%):  [60 %-70 %] 60 %  INTAKE / OUTPUT: Intake/Output      01/29 0701 - 01/30 0700 01/30 0701 - 01/31 0700   P.O.     I.V. (mL/kg) 1650 (24.2)    IV Piggyback 437.5    Total Intake(mL/kg) 2087.5 (30.6)    Urine (mL/kg/hr) 2450 (1.5)    Total Output 2450    Net -362.5         Stool Occurrence 2 x      PHYSICAL EXAMINATION: General:  Acutely ill in moderate respiratory distress Neuro:  AAOx4, speech clear, MAE HEENT:  Mm pink/dry, no JVD Cardiovascular:  s1s2 rrr, no m/r/g Lungs:  resp's shallow, accessory muscle use, lungs bilaterally coarse with crackles R>L Abdomen:  Round/soft, bsx4 active Musculoskeletal:  No acute deformities.  LLE with swelling 1+ >R Skin:  Pale/warm/dry  LABS:  Lab 12/30/12 0355 12/29/12 0515 12/29/12 0031 12/28/12 2104 12/28/12 0025 12/27/12 2204 12/27/12 1136  HGB 7.7* 8.6* -- -- -- 9.0* --  WBC 7.8 8.1 -- -- -- 6.2 --  PLT 255 236 -- -- -- 154 --  NA 133* 136 -- -- -- 131* --  K 3.6 3.1* -- -- -- -- --  CL 98 100 -- -- -- 98 --  CO2 30 25 -- -- -- 24 --  GLUCOSE 196* 107* -- -- -- 153* --  BUN 8 9 -- -- -- 10 --  CREATININE 0.48* 0.52 -- -- -- 0.52 --  CALCIUM 8.1* 8.2* -- -- -- 7.5* --  MG 2.2 -- -- -- -- -- --  PHOS -- -- -- -- -- -- --  AST -- -- -- -- -- -- 39*  ALT -- -- -- -- -- -- 36*  ALKPHOS -- -- -- -- -- -- 62  BILITOT -- -- -- -- -- -- 0.7  PROT -- -- -- -- -- -- 5.9*  ALBUMIN -- -- -- -- -- -- 2.3*  APTT -- -- -- -- -- -- --  INR -- -- -- -- -- -- 1.07  LATICACIDVEN -- -- -- -- -- -- 1.6  TROPONINI -- -- -- -- -- -- --  PROCALCITON -- -- -- -- -- --  0.62  PROBNP -- -- -- -- -- -- --  O2SATVEN -- -- -- -- -- -- --   PHART -- -- 7.411 7.425 7.414 -- --  PCO2ART -- -- 39.6 38.8 37.0 -- --  PO2ART -- -- 88.0 60.4* 78.0* -- --    Lab 12/30/12 0738 12/29/12 2115 12/29/12 1658 12/29/12 1210 12/29/12 0729  GLUCAP 172* 218* 183* 182* 91    CXR: 1/30 - diffuse bilateral airspace disease  ASSESSMENT / PLAN:  PULMONARY  A: Acute Respiratory Failure Bilateral Pulmonary Infiltrates - diffuse infiltrates on CT.  DDx includes atypical infection, viral, medication / chemo reaction, has had herpetic outbreaks during chemo as well. Both gemcitabine and herceptin are associated with pulmonary infiltrates and resp failure (rarely).  Less likely volume with essentially normal ECHO. Pulmonary Embolism - bilateral, small to moderate clot burden  P:   -elective intubation per anesthesia, concern for respiratory fatigue -coverage for atypical infection -diuresis as renal function / BP permit -trial of steroids for infiltrates >> 125mg  q6h x 3 days, then change to ~ 1mg /kg pred -antiviral coverage -scheduled nebs -plan bronch with bacterial, viral cultures 1/30 -f/u abg 1 hour post intubation -f/u cxr post intubation  CARDIOVASCULAR A:  Tachycardia - in setting of respiratory distress / bilateral infiltrates.  ECHO essentially wnl 1/27.   P:  -continue metoprolol, hold am dose until after intubation  RENAL  A:  Hyponatremia  P:   -monitor, follow BMP  GASTROINTESTINAL A:   NPO - post intubation  P:   -plan start TF in am 1/30 -PPI  HEMATOLOGIC / ONC A:   Anemia Hx of Intermittent Neutropenia with chemo Inflammatory Breast Cancer - status post chemo last dose 1/17  P:  -monitor H/H, consider tx if less than 7 -Dr. Welton Flakes aware of status, appreciate input  INFECTIOUS A:   Diffuse Bilateral Infiltrates  P:   -empiric abx, atypical coverage -antiviral coverage with valacyclovir -bronch 1/30 for bacterial, viral cutlure  ENDOCRINE A:  Hyperglycemia  P:   -SSI  NEUROLOGIC A:    Sedation - for intubation  P:   -PRN sedation protocol   Family updated at bedside - son and significant other of 25 years.  Likely expect 1 week + on vent.  Discussed bronchoscopy risks / benefits with patient and family as well.    I have personally obtained a history, examined the patient, evaluated laboratory and imaging results, formulated the assessment and plan and placed orders.  CRITICAL CARE: The patient is critically ill with multiple organ systems failure and requires high complexity decision making for assessment and support, frequent evaluation and titration of therapies, application of advanced monitoring technologies and extensive interpretation of multiple databases. Critical Care Time devoted to patient care services described in this note is 90 minutes.     Canary Brim, NP-C Pulmonary and Critical Care Medicine [facility] Pager: [phone]  12/30/2012, 9:52 AM  Levy Pupa, MD, PhD 12/30/2012, 2:11 PM [facility] [phone] or if no answer [phone]

## 2012-12-30 NOTE — Progress Notes (Signed)
Inpatient Diabetes Program Recommendations  AACE/ADA: New Consensus Statement on Inpatient Glycemic Control (2013)  Target Ranges:  Prepandial:   less than 140 mg/dL      Peak postprandial:   less than 180 mg/dL (1-2 hours)      Critically ill patients:  140 - 180 mg/dL   Reason for Visit: Hyperglycemia and Elevated HgbA1C   Results for Jessica Wells, Jessica Wells (MRN [id number]) as of 12/30/2012 14:36  Ref. Range 12/29/2012 12:10 12/29/2012 16:58 12/29/2012 21:15 12/30/2012 07:38 12/30/2012 11:35  Glucose-Capillary Latest Range: 70-99 mg/dL 147 (H) 829 (H) 562 (H) 172 (H) 176 (H)   Results for Jessica Wells, Jessica Wells (MRN [id number]) as of 12/30/2012 14:36  Ref. Range 12/29/2012 05:15  Hemoglobin A1C Latest Range: <5.7 % 9.7 (H)   Recommendations:  Begin ICU Hyperglycemia Protocol.    Will continue to follow.  Thank you. Ailene Ards, RD, LDN, CDE Inpatient Diabetes Coordinator [phone]

## 2012-12-30 NOTE — Progress Notes (Addendum)
[facility] PROGRESS NOTE  Belma Dyches WUJ:[id number] DOB: 22-Mar-1946 DOA: 12/27/2012 PCP: [name], MD  Assessment/Plan:  Acute hypoxic respiratory failure, likely due to PNA and PE.  Persistently tachypneic to the 40s and 50s on bipap and NRB.  CXR appears worse this morning.   - Continue to monitor in step down unit - Bipap as tolerated - PCCM consult today  Pulmonary embolism - In context of patient with history of + prothrombin gene mutation-heterozygous and breast cancer - s/p IVC filter placement - Continue full dose lovenox  HCAP, still intermittently febrile and persistent high oxygen requirements.  CXR appears worse this morning - Continue vanc and zosyn -  Add levofloxacin  -  flu ag neg so tamiflu discontinued yesterday - Legionella/strep urine antigen negative. -  Consider escalation of zosyn to penem  Question if infiltrates may reflect some pulmonary edema or pulmonary hemorrhage.  ECHO from a few days ago did not demonstrate right or left heart failure or diastolic dysfunction.  Concern that hemoglobin decreased today.   -  DC IVF -  Trial of low dose lasix 20mg  IV once  PAT overnight, resolved with vagal maneuvers.  -  Electrolytes within normal limits -  Continue metoprolol (otherwise would consider weaning given worsening respiratory distress) -  Consider adenosine prn  Lower extremity edema:  Due to bilateral lower extremity DVTs L>R, extensive -  Continue therapeutic lovenox  Diabetes, fingersticks mildly elevated yesterday, but will continue dextrose infusion for now with additional insulin as needed - Increase SSI - hemoglobin a1c 9.7  Breast Cancer, inflammatory right breast cancer with positive LN, ER neg/PRneg/HER2/neu pos - follow by Dr. Welton Flakes as outpatient - Patient supposedly scheduled for mastectomy as outpatient.  Dr. Dwain Sarna supposedly followed patient as outpatient.  Proteinuria: -  Urine P:C   Urinary retention -   Repeat UA -  Place foley  Hypokalemia:  Likely due to poor PO intake.  Resolved. Normocytic anemia:  Decreasing hemoglobin concerning for hemorrhage.  Platelets are stable so DIC less likely -  Occult stool -  Tx for hgb < 7 -  Will send type and screen with repeat CBC this afternoon.    DIET:  NPO except meds due to worsening tachypnea ACCESS:  Port IVF:  D5 1/2 NS with 20 kcl at 4ml/h >> OFF this AM PROPH:  Therapeutic lovenox  Code Status:  full code Family Communication: No family at bedside Disposition Plan: Pending further improvement in respiratory condition   Consultants:  Pulm/critical care  Case discussed with oncology initially  Procedures:  IVC filter placement  Antibiotics:  zosyn and vancomycin 1/27 >>  valacyclovir 1/28 >>  Levofloxacin 1/30 >>  Tamiflu 1/29 >> 1/29  Fluconazole 1/27 >> 1/30  (3 day bursts given as needed for thrush)  HPI/Subjective: Patient continues to have tachypnea and SOB despite bipap.  Tired today.  Feels like she has a mild wheeze.  Denies cough or productive cough.  Has mild nausea and is not eating very well due to SOB.  Denies URI symptoms and diarrhea.  Having difficulty urinating this morning.    Objective: Filed Vitals:   12/30/12 0044 12/30/12 0345 12/30/12 0459 12/30/12 0606  BP:  109/73  107/65  Pulse: 112 109 109 117  Temp:  100.2 F (37.9 C)    TempSrc:  Axillary    Resp: 34 36 32 50  Height:      Weight:      SpO2: 97% 98% 97% 94%  Intake/Output Summary (Last 24 hours) at 12/30/12 0806 Last data filed at 12/30/12 0600  Gross per 24 hour  Intake   2075 ml  Output   2450 ml  Net   -375 ml   Filed Weights   12/27/12 1710 12/27/12 2325  Weight: 67 kg (147 lb 11.3 oz) 68.2 kg (150 lb 5.7 oz)    Exam:   General:  CF, Alert Awake sitting up and appearing tired this morning.  Increased WOB compared to yesterday with SCM, supracostal, supraclavicular, subcostal retractions.    HEENT:   MMM  Cardiovascular: Tachycardic regular rhythm, No MRG  Chest:  Port accessed in left chest with dressing coming off slightly  Respiratory: Increased respiratory rate, no wheezes, course bilateral anterior rales.  Bronchial BS on the left base, left upper chest, right upper chest, with course rales throughout.  No rhonchi.    Abdomen: soft, NT, ND  MSK:  1+ RLE, 2+ LLE pitting edema  Data Reviewed: Basic Metabolic Panel:  Lab 12/30/12 0454 12/29/12 0515 12/27/12 2204 12/27/12 1136 12/27/12 1048  NA 133* 136 131* 124* 126*  K 3.6 3.1* 3.6 3.9 4.0  CL 98 100 98 90* 92*  CO2 30 25 24 23 24   GLUCOSE 196* 107* 153* 281* 288*  BUN 8 9 10 14  14.7  CREATININE 0.48* 0.52 0.52 0.59 1.1  CALCIUM 8.1* 8.2* 7.5* 8.2* 8.7  MG 2.2 -- -- -- --  PHOS -- -- -- -- --   Liver Function Tests:  Lab 12/27/12 1136  AST 39*  ALT 36*  ALKPHOS 62  BILITOT 0.7  PROT 5.9*  ALBUMIN 2.3*   No results found for this basename: LIPASE:5,AMYLASE:5 in the last 168 hours No results found for this basename: AMMONIA:5 in the last 168 hours CBC:  Lab 12/30/12 0355 12/29/12 0515 12/27/12 2204 12/27/12 1136 12/27/12 1048  WBC 7.8 8.1 6.2 8.6 9.5  NEUTROABS -- -- 5.7 7.9* 8.5*  HGB 7.7* 8.6* 9.0* 9.9* 10.1*  HCT 22.5* 24.7* 25.2* 28.4* 29.3*  MCV 93.4 92.5 91.3 92.5 91.6  PLT 255 236 154 173 181   Cardiac Enzymes: No results found for this basename: CKTOTAL:5,CKMB:5,CKMBINDEX:5,TROPONINI:5 in the last 168 hours BNP (last 3 results) No results found for this basename: PROBNP:3 in the last 8760 hours CBG:  Lab 12/29/12 2115 12/29/12 1658 12/29/12 1210 12/29/12 0729 12/28/12 2035  GLUCAP 218* 183* 182* 91 126*    Recent Results (from the past 240 hour(s))  TECHNOLOGIST REVIEW     Status: Normal   Collection Time   12/22/12 12:44 PM      Component Value Range Status Comment   Technologist Review Metas and Myelocytes present   Final   CULTURE, BLOOD (ROUTINE X 2)     Status: Normal (Preliminary  result)   Collection Time   12/27/12 11:30 AM      Component Value Range Status Comment   Specimen Description BLOOD RIGHT HAND   Final    Special Requests BOTTLES DRAWN AEROBIC AND ANAEROBIC 5CC   Final    Culture  Setup Time 12/27/2012 21:48   Final    Culture     Final    Value:        BLOOD CULTURE RECEIVED NO GROWTH TO DATE CULTURE WILL BE HELD FOR 5 DAYS BEFORE ISSUING A FINAL NEGATIVE REPORT   Report Status PENDING   Incomplete   CULTURE, BLOOD (ROUTINE X 2)     Status: Normal (Preliminary result)   Collection Time  12/27/12 11:30 AM      Component Value Range Status Comment   Specimen Description BLOOD LEFT HAND   Final    Special Requests BOTTLES DRAWN AEROBIC AND ANAEROBIC 5CC   Final    Culture  Setup Time 12/27/2012 21:48   Final    Culture     Final    Value:        BLOOD CULTURE RECEIVED NO GROWTH TO DATE CULTURE WILL BE HELD FOR 5 DAYS BEFORE ISSUING A FINAL NEGATIVE REPORT   Report Status PENDING   Incomplete   URINE CULTURE     Status: Normal   Collection Time   12/27/12  1:13 PM      Component Value Range Status Comment   Specimen Description URINE, CATHETERIZED   Final    Special Requests NONE   Final    Culture  Setup Time 12/27/2012 22:28   Final    Colony Count 50,000 COLONIES/ML   Final    Culture     Final    Value: Multiple bacterial morphotypes present, none predominant. Suggest appropriate recollection if clinically indicated.   Report Status 12/28/2012 FINAL   Final   MRSA PCR SCREENING     Status: Normal   Collection Time   12/27/12 11:21 PM      Component Value Range Status Comment   MRSA by PCR NEGATIVE  NEGATIVE Final      Studies: No results found.  Scheduled Meds:    . enoxaparin (LOVENOX) injection  70 mg Subcutaneous Q12H  . feeding supplement  1 Container Oral TID BM  . fluconazole  200 mg Oral Daily  . furosemide  20 mg Intravenous Once  . insulin aspart  0-5 Units Subcutaneous QHS  . insulin aspart  0-9 Units Subcutaneous TID WC  .  metoprolol succinate  50 mg Oral Daily  . pantoprazole  40 mg Oral Daily  . piperacillin-tazobactam (ZOSYN)  IV  3.375 g Intravenous Q8H  . valACYclovir  500 mg Oral TID  . vancomycin  500 mg Intravenous Q12H   Continuous Infusions:    Principal Problem:  *Acute respiratory failure with hypoxia Active Problems:  Breast cancer  Clotting disorder  DVT (deep venous thrombosis)  SOB (shortness of breath)  Healthcare-associated pneumonia  Acute respiratory distress  Hypoxemia  Pulmonary embolism  Hypokalemia    Time spent: > 35 minutes    Ragen Laver  [facility] Pager [phone] If 7PM-7AM, please contact night-coverage at [email or web address], password [facility] 12/30/2012, 8:06 AM  LOS: 3 days

## 2012-12-30 NOTE — Progress Notes (Signed)
Patient ID: Jessica Wells, female   DOB: 1946/03/19, 67 y.o.   MRN: [id number] Events noted, I know Ms Diers well from diagnosis of inflammatory breast cancer.  I had her scheduled for mrm next week but this obviously will be delayed.  I will plan on seeing her a week after she leaves hospital and then will decide on scheduling.  I discussed this with her this am

## 2012-12-30 NOTE — Progress Notes (Signed)
Pt presented around 0600 with PAT; HR 180s-190s, asymptomatic; pt encouraged to cough and bear down. Pt came out of PAT spontaneously. Has hx of PAT. NP aware. Will continue to monitor.

## 2012-12-30 NOTE — Progress Notes (Signed)
[facility]  [phone]   DIAGNOSIS: 67 year old female with new diagnosis of inflammatory breast cancer   PRIOR THERAPY:   #1 patient was originally seen inclinic for new diagnosis of inflammatory breast cancer she was referred by Dr. Emelia Loron. She had a mammogram performed That showed an abnormality. That was biopsied and it showed an invasive mammary carcinoma with lymphovascular invasion grade 3 ER negative PR negative HER-2/neu positive.  #2 patient has gone on to have MRI of the breasts performed the MRI shows diffuse right breast neoplasm on a large level I right axillary lymph nodes compatible with lymphatic spread.Patient has had a biopsy of the right axillary lymph node that is compatible with invasive mammary carcinoma.  #3 patient began neoadjuvant FEC 100 with day 2 Neulasta on 07/23/2012  #4 Weekly neoadjuvant Taxol and Herceptin starting 09/17/12, patient developed neuropathies, and taxol was discontinued early and patient received 2 weeks of herceptin only. She completed 7 weeks of Taxol/Herceptin combination therapy.  #5 Weekly Gemcitabine and Herceptin starting on 11/19/12   CURRENT THERAPY: Herceptin/Gemcitabine cycle 5 12/17/12   HOSPITAL PROGRESS NOTE  Events since 1/27 noted. Patient was diagnosed per CT angio on 1/27 with bilateral PE despite being on Lovenox 70 mg sq bid for Bilateral DVT as an outpatient. At the time, a Hypercoagulable panel had been drawn which was positive for prothrombin gene mutation-heterozygous. She had an IVC filter placement on 1/27 by IR without complications and continued with full dose  Lovenox per pharmacy. Status was complicated by acute hypoxic respiratory failure likely due to PE and Pneumonia on BiPap and IV antibiotics with Vanc, Zosyn and Levofloxacin.  She continues to have significant shortness of breath. In addition, she had episodes of PAT resolved with vagal maneuvers.ECHO from 1/27 did not  demonstrate right or left heart failure or diastolic dysfunction. Ef was 60-65 percent.      MEDICATIONS:    . enoxaparin (LOVENOX) injection  70 mg Subcutaneous Q12H  . furosemide  20 mg Intravenous Once  . insulin aspart  0-15 Units Subcutaneous TID WC  . insulin aspart  0-5 Units Subcutaneous QHS  . levalbuterol  1.25 mg Nebulization Q6H  . levofloxacin (LEVAQUIN) IV  750 mg Intravenous Q24H  . methylPREDNISolone (SOLU-MEDROL) injection  60 mg Intravenous Q12H  . metoprolol succinate  50 mg Oral Daily  . midazolam      . pantoprazole  40 mg Oral Daily  . piperacillin-tazobactam (ZOSYN)  IV  3.375 g Intravenous Q8H  . valACYclovir  500 mg Oral TID  . vancomycin  500 mg Intravenous Q12H    ALLERGIES:   Allergies  Allergen Reactions  . Anesthetics, Amide Nausea And Vomiting    Projectile vomitting-Nausea- with 24hrs of dry heaves. With any anesthetics  . Bactrim (Sulfamethoxazole W-Trimethoprim) Other (See Comments)    Massive diarrhea, nausea vomiting, platelets dropped, hemoglobin dropped, admitted to hospital  . Sulfa Antibiotics Nausea And Vomiting and Other (See Comments)    Crazy feeling in head Life threatening reaction, decreased blood counts  . Codeine Nausea And Vomiting  . Codeine Phosphate Nausea And Vomiting  . Eggs Or Egg-Derived Products Hives     PHYSICAL EXAMINATION:   Filed Vitals:   12/30/12 1100  BP: 85/57  Pulse: 105  Temp:   Resp: 57     67 year old  in  Moderate respiatory discomfort, ill appearing. General well-developed and well-nourished  HEENT: Normocephalic, atraumatic, PERRLA. Oral cavity without thrush or lesions. Neck supple. no  thyromegaly, no cervical or supraclavicular adenopathy  Lungs .No wheezing, L coarse bilateral crackles. No audible rhonchi. L port negative Cardiac: regular rate and rhythm,no murmur , rubs or gallops Abdomen soft nontender , bowel sounds x4. No HSM Extremities no clubbing cyanosis, bilateral pitting  edema, L 2+, R 1 + No bruising or petechial rash Neuro: non focal  LABORATORY/RADIOLOGY DATA:   Lab 12/30/12 0355 12/29/12 0515 12/27/12 2204 12/27/12 1136 12/27/12 1048  WBC 7.8 8.1 6.2 8.6 9.5  HGB 7.7* 8.6* 9.0* 9.9* 10.1*  HCT 22.5* 24.7* 25.2* 28.4* 29.3*  PLT 255 236 154 173 181  MCV 93.4 92.5 91.3 92.5 91.6  MCH 32.0 32.2 32.6 32.2 31.6  MCHC 34.2 34.8 35.7 34.9 34.5  RDW 16.4* 16.3* 16.4* 16.4* 16.6*  LYMPHSABS -- -- 0.3* 0.4* 0.5*  MONOABS -- -- 0.2 0.3 0.4  EOSABS -- -- 0.0 0.0 0.0  BASOSABS -- -- 0.0 0.0 0.0  BANDABS -- -- -- -- --    CMP    Lab 12/30/12 0355 12/29/12 0515 12/27/12 2204 12/27/12 1136 12/27/12 1048  NA 133* 136 131* 124* 126*  K 3.6 3.1* 3.6 3.9 4.0  CL 98 100 98 90* 92*  CO2 30 25 24 23 24   GLUCOSE 196* 107* 153* 281* 288*  BUN 8 9 10 14  14.7  CREATININE 0.48* 0.52 0.52 0.59 1.1  CALCIUM 8.1* 8.2* 7.5* 8.2* 8.7  MG 2.2 -- -- -- --  AST -- -- -- 39* --  ALT -- -- -- 36* --  ALKPHOS -- -- -- 62 --  BILITOT -- -- -- 0.7 --        Component Value Date/Time   BILITOT 0.7 12/27/2012 1136   BILITOT 0.46 12/17/2012 1049    Anemia panel:  No results found for this basename: VITAMINB12:2,FOLATE:2,FERRITIN:2,TIBC:2,IRON:2,RETICCTPCT:2 in the last 72 hours  No results found for this basename: TSH,T4TOTAL,FREET3,T3FREE,THYROIDAB in the last 72 hours   No results found for this basename: esrsedrate     Lab 12/27/12 1136  INR 1.07  PROTIME --      Urinalysis    Component Value Date/Time   COLORURINE YELLOW 12/27/2012 1313   APPEARANCEUR CLOUDY* 12/27/2012 1313   LABSPEC 1.025 12/27/2012 1313   PHURINE 6.0 12/27/2012 1313   GLUCOSEU >1000* 12/27/2012 1313   HGBUR NEGATIVE 12/27/2012 1313   BILIRUBINUR NEGATIVE 12/27/2012 1313   KETONESUR TRACE* 12/27/2012 1313   PROTEINUR 100* 12/27/2012 1313   UROBILINOGEN 1.0 12/27/2012 1313   NITRITE NEGATIVE 12/27/2012 1313   LEUKOCYTESUR SMALL* 12/27/2012 1313    Drugs of Abuse  No results found  for this basename: labopia, cocainscrnur, labbenz, amphetmu, thcu, labbarb     Liver Function Tests:  Lab 12/27/12 1136  AST 39*  ALT 36*  ALKPHOS 62  BILITOT 0.7  PROT 5.9*  ALBUMIN 2.3*   No results found for this basename: LIPASE:5,AMYLASE:5 in the last 168 hours No results found for this basename: AMMONIA:5 in the last 168 hours  CBG:  Lab 12/30/12 1135 12/30/12 0738 12/29/12 2115 12/29/12 1658 12/29/12 1210  GLUCAP 176* 172* 218* 183* 182*   Hgb A1c  Basename 12/29/12 0515  HGBA1C 9.7*     Radiology Studies:   Ct Angio Chest Pe W/cm &/or Wo Cm  12/27/2012  *RADIOLOGY REPORT*  Clinical Data: Pleuritic chest pain, hypoxemia, tachycardia, history of breast cancer with new diagnosis of DVT, evaluate for pulmonary embolism  CT ANGIOGRAPHY CHEST  Technique:  Multidetector CT imaging of the chest using the standard  protocol during bolus administration of intravenous contrast. Multiplanar reconstructed images including MIPs were obtained and reviewed to evaluate the vascular anatomy.  Contrast: OMNIPAQUE IOHEXOL 350 MG/ML SOLN  Comparison: CT chest, abdomen pelvis - 07/26/2012; PET CT - 07/26/2012; chest radiograph - earlier same day  Vascular Findings:  There is suboptimal opacification of the pulmonary arterial system of the main pulmonary artery measuring only 217 HU.  Given this limitation, there is are discrete filling defects within the peripheral aspect of the right main pulmonary artery (image 114, series 5) with nonocclusive thrombus extending into nearly all major segmental branches of the right lung.  There is a there is nonocclusive thrombus within the left anterior/medial basilar segmental pulmonary artery of the left lower lobe (image 122, series five).  The overall clot burden is felt to be small to moderate.  Caliber of the main pulmonary artery is normal measuring 3 cm in diameter.  There is no definitive bowing of the interventricular septum.  There is no reflux  of injected contrast into the hepatic venous system.  Normal heart size.  Trace amount of pericardial fluid, possibly physiologic.  Normal caliber thoracic aorta.  Left anterior chest wall port catheter tip terminates within the distal SVC.   Nonvascular findings:  There has been interval development of rather extensive ground- glass opacities within the bilateral lungs with slight apical predominance.  This represents a marked change from prior chest CT performed 07/26/2012.  No discrete air bronchograms.  Central pulmonary airways are patent.  No pleural effusion or pneumothorax.  Interval development of mediastinal and bilateral hilar adenopathy with index pretracheal node measuring 1.1 cm in short axis diameter (image 26, series 4, right hilar nodal the lung aeration measuring approximately 1.2 cm (image 36) and left hilar nodal conglomeration measuring approximately 0.9 cm (image 27).  Interval resolution of right-sided axillary adenopathy.  Limited early arterial phase evaluation of the upper abdomen suggests decreased attenuation of the hepatic parenchyma suggestive of hepatic steatosis.  Hiatal hernia.  Evaluation of the sternum is degraded secondary to streak artifact and patient motion, however there is a possible nondisplaced fracture involving the mid aspect of the sternum (image 78, series nine).  No discrete aggressive osseous lesions.  Decreased conspicuity of previously noted right-sided retroareolar mass.  IMPRESSION:  1.  Examination is positive for bilateral pulmonary embolism, right greater than left. Overall clot burden is felt to be small to moderate.  No CT evidence of right-sided heart failure.  2.  Interval development of extensive bilateral heterogeneous opacities with slight upper lobe predominance.  Differential considerations are broad and include drug toxicity, though note, atypical infection may have a similar appearance. 3.  Interval development of mediastinal bilateral hilar  adenopathy, again nonspecific and while possibly reactive, metastatic disease is not excluded.  4.  Interval resolution of right-sided axillary adenopathy and mass like thickening within the right retro alveolar breast.  5. Possible nondisplaced fracture of the sternum, possibly artifactual due to respiratory artifact.  Correlation for point tenderness at this location is recommended.  No discrete associated aggressive osseous lesion.  Above findings discussed with Dr. Lorenso Courier at [phone].   Original Report Authenticated By: Tacey Ruiz, MD    Ir Ivc Filter Plmt / S&i /img Guid/mod Sed  12/28/2012  *RADIOLOGY REPORT*  Clinical Data:  Lower extremity DVT, pulmonary emboli, breast cancer  ULTRASOUND GUIDANCE FOR VASCULAR ACCESS IVC CATHETERIZATION AND VENOGRAM IVC FILTER INSERTION  Date:  12/27/2012 17:40:00  Radiologist:  M. Ruel Favors, M.D.  Medications:  1 mg Versed, 50 mcg Fentanyl  Guidance:  Ultrasound fluoroscopic  Fluoroscopy time:  1.8 minutes  Sedation time:  30 minutes  Contrast Volume:  40 ml Omnipaque-300  Complications:  No immediate  PROCEDURE/FINDINGS:  Informed consent was obtained from the patient following explanation of the procedure, risks, benefits and alternatives. The patient understands, agrees and consents for the procedure. All questions were addressed.  A time out was performed.  Maximal barrier sterile technique utilized including caps, mask, sterile gowns, sterile gloves, large sterile drape, hand hygiene, and betadine prep.  Under sterile condition and local anesthesia, right internal jugular venous access was performed with ultrasound.  Over a guide wire, the IVC filter delivery sheath and inner dilator were advanced into the IVC just above the IVC bifurcation.  Contrast injection was performed for an IVC venogram.  IVC VENOGRAM:  The IVC is patent.  No evidence of thrombus, stenosis, or occlusion.  No variant venous anatomy.  The renal veins are identified at L1-2.  IVC FILTER  INSERTION:  Through the delivery sheath, the Cordis Celect IVC filter was deployed in the infrarenal IVC at the L3 level just below the renal veins and above the IVC bifurcation. Contrast injection confirmed position.  There is good apposition of the filter against the IVC.  The delivery sheath was removed and hemostasis was obtained with compression for 5 minutes.  The patient tolerated the procedure well.  No immediate complications.  IMPRESSION: Ultrasound and fluoroscopically guided infrarenal IVC filter insertion.   Original Report Authenticated By: Judie Petit. Miles Costain, M.D.   Dg Chest Port 1 View  12/30/2012  *RADIOLOGY REPORT*  Clinical Data: Evaluate endotracheal tube  PORTABLE CHEST - 1 VIEW  Comparison: 12/30/2012  Findings: An endotracheal tube is in place and the tip is located 1.7 cm above the level of the carina.  An orogastric tube is in place and the tip is located below the level of the hemidiaphragm but not included on this exam.  A left subclavian CVP is stable in location.  Heart and mediastinal contours are unchanged.  Bilateral diffuse alveolar infiltrates are seen with a perihilar distribution and some increase in central peribronchial cuffing. There may be a small left pleural effusion and suggestion of some interstitial septal lines is noted at the right base.  The appearance is suspicious for pulmonary edema with a noncardiogenic etiology suspected given the normal heart size. Multifocal pneumonia could have a similar appearance and clinical correlation is needed.  IMPRESSION: Lines and tubes as above with endotracheal tube position as noted. Little interval change in the bilateral alveolar infiltrative pattern suspicious for noncardiogenic pulmonary edema with a diffuse infectious process not excluded in the appropriate clinical setting   Original Report Authenticated By: Rhodia Albright, M.D.    Dg Chest Port 1 View  12/30/2012  *RADIOLOGY REPORT*  Clinical Data: Evaluate for pulmonary edema.   PORTABLE CHEST - 1 VIEW  Comparison: Chest x-ray 12/27/2012.  Findings: Left subclavian single Port-A-Cath with tip terminating in the superior cavoatrial junction.  Increasing diffuse interstitial and patchy airspace disease, most confluent in the right upper lobe.  No definite pleural effusions.  Pulmonary vasculature appears engorged, but is largely obscured.  Heart size is upper limits of normal.  Mediastinal contours are largely obscured.  IMPRESSION: 1.  Significantly worsened aeration related to increasing interstitial and patchy air space disease throughout the lungs bilaterally.  This is relatively symmetric, suggesting some degree of pulmonary edema, however, the more confluent opacity in the  right upper lobe could be concerning for pneumonia.  Clinical correlation is recommended.   Original Report Authenticated By: Trudie Reed, M.D.        ASSESSMENT AND PLAN:   #1 inflammatory right breast carcinoma with positive lymph nodes the tumor is ER negative PR negative HER-2/neu positive. patient began neoadjuvant FEC 100 with day 2 Neulasta on 07/23/2012 , then Weekly neoadjuvant Taxol and Herceptin starting 09/17/12. Patient developed neuropathies, and taxol was discontinued early and patient received 2 weeks of herceptin only. She completed 7 weeks of Taxol/Herceptin combination therapy. She received  weekly Gemcitabine and Herceptin starting on 11/19/12 , C 5 on 12/17/2012.   Dr. Dwain Sarna to reschedule mastectomy  when patient released from hospital.Plans for radiation (Dr. Michell Heinrich) are delayed as well.  2. Bilateral DVT?PE , Positive Prothrombin G Mutation, heterozygous. S/p  IVC filter 12/27/12. Lovenox Full dose per pharmacy  3.HCAP/ Acute Hypoxic Respiratory failure: on IV antibiotics and Bi Pap. As per admitting team.  4. Fever, multifactorial. On IV Abx O2  Adonis Ryther E 12/30/2012, 12:49 PM

## 2012-12-30 NOTE — Procedures (Signed)
Bronchoscopy Procedure Note Jessica Wells [id number] 06-07-1946  Procedure: Bronchoscopy Indications: Obtain specimens for culture and/or other diagnostic studies  Procedure Details Consent: Risks of procedure as well as the alternatives and risks of each were explained to the (patient/caregiver).  Consent for procedure obtained. Time Out: Verified patient identification, verified procedure, site/side was marked, verified correct patient position, special equipment/implants available, medications/allergies/relevent history reviewed, required imaging and test results available.  Performed  In preparation for procedure, patient was given 100% FiO2. Sedation: Benzodiazepines  Airway entered and the following bronchi were examined: RUL, RML, RLL, LUL and LLL.   Procedures performed: LUL BAL, 60cc NS instilled and 20cc returned Bronchoscope removed.  , Patient placed back on 100% FiO2 at conclusion of procedure.    Evaluation Hemodynamic Status: Transient hypotension O2 sats: stable throughout Patient's Current Condition: stable Specimens: LUL BAL for cytology, cell count, micro studies (bacterial, fungal, viral, AFB, PCP DFA) Complications: No apparent complications Patient did tolerate procedure well.   Levy Pupa, MD, PhD 12/30/2012, 4:11 PM [facility] [phone] or if no answer [phone]

## 2012-12-31 ENCOUNTER — Ambulatory Visit: Payer: Medicare Other | Admitting: Cardiovascular Disease

## 2012-12-31 ENCOUNTER — Encounter: Payer: Self-pay | Admitting: Internal Medicine

## 2012-12-31 ENCOUNTER — Inpatient Hospital Stay (HOSPITAL_COMMUNITY): Payer: Medicare Other

## 2012-12-31 LAB — BASIC METABOLIC PANEL
BUN: 19 mg/dL (ref 6–23)
Calcium: 8.4 mg/dL (ref 8.4–10.5)
Creatinine, Ser: 0.84 mg/dL (ref 0.50–1.10)
Creatinine, Ser: 0.93 mg/dL (ref 0.50–1.10)
GFR calc Af Amer: 82 mL/min — ABNORMAL LOW (ref >90)
GFR calc Af Amer: 72 mL/min — ABNORMAL LOW (ref >90)
GFR calc non Af Amer: 70 mL/min — ABNORMAL LOW (ref >90)
GFR calc non Af Amer: 62 mL/min — ABNORMAL LOW (ref >90)
Sodium: 135 mEq/L (ref 135–145)

## 2012-12-31 LAB — BODY FLUID CELL COUNT WITH DIFFERENTIAL
Eos, Fluid: 0 %
Monocyte-Macrophage-Serous Fluid: 46 % — ABNORMAL LOW (ref 50–90)
Neutrophil Count, Fluid: 47 % — ABNORMAL HIGH (ref 0–25)

## 2012-12-31 LAB — GLUCOSE, CAPILLARY: Glucose-Capillary: 261 mg/dL — ABNORMAL HIGH (ref 70–99)

## 2012-12-31 LAB — CBC
MCH: 31.7 pg (ref 26.0–34.0)
MCHC: 34 g/dL (ref 30.0–36.0)
MCV: 93.3 fL (ref 78.0–100.0)
Platelets: 268 10*3/uL (ref 150–400)
RBC: 2.24 MIL/uL — ABNORMAL LOW (ref 3.87–5.11)
RDW: 16.1 % — ABNORMAL HIGH (ref 11.5–15.5)

## 2012-12-31 LAB — BLOOD GAS, ARTERIAL
Acid-Base Excess: 2.6 mmol/L — ABNORMAL HIGH (ref 0.0–2.0)
Bicarbonate: 27.4 mEq/L — ABNORMAL HIGH (ref 20.0–24.0)
Drawn by: 317871
FIO2: 0.4 %
O2 Saturation: 90.6 %
pCO2 arterial: 46.2 mmHg — ABNORMAL HIGH (ref 35.0–45.0)
pO2, Arterial: 57 mmHg — ABNORMAL LOW (ref 80.0–100.0)

## 2012-12-31 MED ORDER — POTASSIUM CHLORIDE 20 MEQ/15ML (10%) PO LIQD
40.0000 meq | ORAL | Status: AC
  Administered 2012-12-31 (×2): 40 meq
  Filled 2012-12-31 (×2): qty 30

## 2012-12-31 MED ORDER — POTASSIUM CHLORIDE 10 MEQ/100ML IV SOLN
10.0000 meq | INTRAVENOUS | Status: AC
  Administered 2012-12-31 (×4): 10 meq via INTRAVENOUS
  Filled 2012-12-31 (×4): qty 100

## 2012-12-31 MED ORDER — PRO-STAT SUGAR FREE PO LIQD
30.0000 mL | Freq: Three times a day (TID) | ORAL | Status: DC
  Administered 2012-12-31 – 2013-01-03 (×6): 30 mL
  Filled 2012-12-31 (×14): qty 30

## 2012-12-31 MED ORDER — FUROSEMIDE 10 MG/ML IJ SOLN
40.0000 mg | Freq: Once | INTRAMUSCULAR | Status: AC
  Administered 2012-12-31: 40 mg via INTRAVENOUS
  Filled 2012-12-31: qty 4

## 2012-12-31 MED ORDER — ADULT MULTIVITAMIN LIQUID CH
5.0000 mL | Freq: Every day | ORAL | Status: DC
  Administered 2012-12-31 – 2013-01-01 (×2): 5 mL
  Filled 2012-12-31 (×4): qty 5

## 2012-12-31 MED ORDER — PANTOPRAZOLE SODIUM 40 MG PO PACK
40.0000 mg | PACK | Freq: Every day | ORAL | Status: DC
  Administered 2012-12-31 – 2013-01-01 (×2): 40 mg
  Filled 2012-12-31 (×4): qty 20

## 2012-12-31 MED ORDER — POTASSIUM CHLORIDE 20 MEQ/15ML (10%) PO LIQD
40.0000 meq | Freq: Once | ORAL | Status: AC
  Administered 2012-12-31: 40 meq
  Filled 2012-12-31: qty 30

## 2012-12-31 MED ORDER — OXEPA PO LIQD
1000.0000 mL | ORAL | Status: DC
  Administered 2012-12-31 – 2013-01-01 (×2): 1000 mL
  Filled 2012-12-31 (×3): qty 1000

## 2012-12-31 MED ORDER — METOPROLOL TARTRATE 25 MG/10 ML ORAL SUSPENSION
25.0000 mg | Freq: Two times a day (BID) | ORAL | Status: DC
  Administered 2012-12-31 – 2013-01-01 (×4): 25 mg via ORAL
  Filled 2012-12-31 (×6): qty 10

## 2012-12-31 NOTE — Progress Notes (Signed)
Name: [name] MRN: [id number] DOB: November 02, 1946  ELECTRONIC ICU PHYSICIAN NOTE  Problem:  Hypokalemia   Lab 12/31/12 0445 12/30/12 0355 12/29/12 0515  NA 135 133* 136  K 2.5* 3.6 3.1*  CL 96 98 100  CO2 28 30 25   BUN 16 8 9   CREATININE 0.84 0.48* 0.52  GLUCOSE 280* 196* 107*       Intervention:  KCl x 40 meq IV and 40 meq per OG ordered  Sandrea Hughs 12/31/2012, 5:33 AM

## 2012-12-31 NOTE — Progress Notes (Signed)
PULMONARY  / CRITICAL CARE MEDICINE  Name: Jessica Wells MRN: [id number] DOB: 23-Jan-1946    ADMISSION DATE:  12/27/2012 CONSULTATION DATE:  12/30/12   REFERRING MD :  Dr. Malachi Bonds - [facility]  CHIEF COMPLAINT:  Acute Respiratory Failure  BRIEF PATIENT DESCRIPTION: 68 y/o F, former smoker, with PMH of GERD, Asthma, inflammatory breast cancer who completed 22 weeks of Gemzar & Herceptin on 1/17 (followed by Dr. Welton Flakes).  Diagnosed with DVT on 1/24 and Rx'd with lovenox. Coag panel was positive for prothrombin gene mutation - heterozygous.   Seen in cancer center on 1/27 and was noted to be grey with decreased saturations to 70% on RA. Transferred to [facility] ER with CTA positive for PE (small / mod clot burden) and IVC filter was placed.  Significant diffuse bilateral infiltrates on CT noted. 1/30 pt with continued tachypnea, hypoxemia despite BiPAP and PCCM consulted for respiratory failure.    SIGNIFICANT EVENTS / STUDIES:  1/27 - Admit with 3 day hx worsening shortness of breath from cancer center, decreased energy.  Denies flu-like symptoms  1/27 CTA CHEST>>>positive for bilateral pulmonary embolism, right greater than left. Overall clot burden is felt to be small to  moderate. No CT evidence of right-sided heart failure. Interval development of extensive bilateral heterogeneous opacities with slight upper lobe predominance. Interval development of mediastinal bilateral hilar adenopathy, again nonspecific and while possibly reactive, metastatic disease is not excluded. Interval resolution of right-sided axillary adenopathy and mass like thickening within the right retro alveolar breast.  Possible nondisplaced fracture of the sternum, possibly artifactual due to respiratory artifact.  No discrete associated aggressive osseous lesion. 1/27 ECHO>>>EF 60-65% 1/30 >> Elective intubation for increased work of breathing, concern for respiratory fatigue.   LINES / TUBES: OETT 1/30>>> L port-a-cath >>   CULTURES: 1/27  MRSA PCR: neg  1/27 BCx2>>> 1/27 UC>>>50k multiple morphotypes  ANTIBIOTICS: Levaquin 1/27>>> Zosyn 1/27>>> Vanco 1/27>>>  SUBJECTIVE: Patient still remains SOB with increased work of breathing, on PRVC with 40% Fi02.   VITAL SIGNS: Temp:  [98.1 F (36.7 C)-100.1 F (37.8 C)] 98.5 F (36.9 C) (01/31 0400) Pulse Rate:  [85-126] 85  (01/31 0600) Resp:  [20-38] 31  (01/31 0600) BP: (85-132)/(54-74) 117/66 mmHg (01/31 0600) SpO2:  [89 %-100 %] 96 % (01/31 0600) FiO2 (%):  [40 %-100 %] 40 % (01/31 0800) Weight:  [70.4 kg (155 lb 3.3 oz)] 70.4 kg (155 lb 3.3 oz) (01/31 0500)  HEMODYNAMICS:    VENTILATOR SETTINGS: Vent Mode:  [-] PRVC FiO2 (%):  [40 %-100 %] 40 % Set Rate:  [25 bmp-35 bmp] 35 bmp Vt Set:  [300 mL-400 mL] 350 mL PEEP:  [5 cmH20-10 cmH20] 5 cmH20 Pressure Support:  [10 cmH20] 10 cmH20 Plateau Pressure:  [10 cmH20-26 cmH20] 19 cmH20  INTAKE / OUTPUT: Intake/Output      01/30 0701 - 01/31 0700 01/31 0701 - 02/01 0700   I.V. (mL/kg) 329.8 (4.7)    IV Piggyback 789.5    Total Intake(mL/kg) 1119.3 (15.9)    Urine (mL/kg/hr) 1935 (1.1)    Emesis/NG output 120    Total Output 2055    Net -935.8           PHYSICAL EXAMINATION: General:  Intubated, Acutely ill in moderate respiratory distress Neuro:  Alert and oriented HEENT:  Mm pink/dry, no JVD Cardiovascular:  s1s2 rrr, no m/r/g Lungs:  resp's shallow, lungs bilaterally coarse with crackles R>L Abdomen:  Round/soft, bsx4 active Musculoskeletal:  No acute deformities.  LLE  with swelling 1+ >R Skin:  Pale/warm/dry  LABS:  Lab 12/31/12 0459 12/31/12 0445 12/30/12 1613 12/30/12 1210 12/30/12 1148 12/30/12 0355 12/29/12 0515 12/27/12 1136  HGB -- 7.1* -- 7.9* -- 7.7* -- --  WBC -- 5.2 -- 8.3 -- 7.8 -- --  PLT -- 268 -- 292 -- 255 -- --  NA -- 135 -- -- -- 133* 136 --  K -- 2.5* -- -- -- 3.6 -- --  CL -- 96 -- -- -- 98 100 --  CO2 -- 28 -- -- -- 30 25 --  GLUCOSE -- 280* -- -- -- 196* 107* --  BUN --  16 -- -- -- 8 9 --  CREATININE -- 0.84 -- -- -- 0.48* 0.52 --  CALCIUM -- 8.4 -- -- -- 8.1* 8.2* --  MG -- -- -- -- -- 2.2 -- --  PHOS -- -- -- -- -- -- -- --  AST -- -- -- -- -- -- -- 39*  ALT -- -- -- -- -- -- -- 36*  ALKPHOS -- -- -- -- -- -- -- 62  BILITOT -- -- -- -- -- -- -- 0.7  PROT -- -- -- -- -- -- -- 5.9*  ALBUMIN -- -- -- -- -- -- -- 2.3*  APTT -- -- -- -- -- -- -- --  INR -- -- -- -- -- -- -- 1.07  LATICACIDVEN -- -- -- -- -- -- -- 1.6  TROPONINI -- -- -- -- -- -- -- --  PROCALCITON -- -- -- -- -- -- -- 0.62  PROBNP -- -- -- -- -- -- -- --  O2SATVEN -- -- -- -- -- -- -- --  PHART 7.390 -- 7.378 -- 7.293* -- -- --  PCO2ART 46.2* -- 52.8* -- 59.2* -- -- --  PO2ART 57.0* -- 69.0* -- 110.0* -- -- --    Lab 12/31/12 0741 12/31/12 0318 12/30/12 2338 12/30/12 1950 12/30/12 1528  GLUCAP 227* 261* 231* 147* 154*    CXR: 1/31 - diffuse bilateral airspace disease, really no sig change  ASSESSMENT / PLAN:  PULMONARY  A: Acute Respiratory Failure Bilateral Pulmonary Infiltrates - diffuse infiltrates on CT.  DDx includes atypical infection, viral, medication / chemo reaction, has had herpetic outbreaks during chemo as well. Both gemcitabine and herceptin are associated with pulmonary infiltrates and resp failure (rarely).  Less likely volume with essentially normal ECHO. Pulmonary Embolism - bilateral, small to moderate clot burden  P:   -full vent support -coverage for atypical infection -diuresis as renal function / BP permit -trial of steroids for infiltrates >> 125mg  q6h x 3 days, then change to ~ 1mg /kg pred -antiviral coverage -scheduled nebs -cont systemic anticoagulation   CARDIOVASCULAR A:  Tachycardia - in setting of respiratory distress / bilateral infiltrates.  ECHO essentially wnl 1/27.   P:  -continue metoprolol as BP permits. Hold for SBP < 100  RENAL  A:  Hyponatremia, Hypokalemia  P:   -monitor, replace K aggressively given diuresis plan   follow BMP  GASTROINTESTINAL A:   NPO - post intubation  P:   -plan start TF in am 1/30 -PPI  HEMATOLOGIC / ONC A:   Anemia Hx of Intermittent Neutropenia with chemo Inflammatory Breast Cancer - status post chemo last dose 1/17 PE  P:  -monitor H/H, consider tx if less than 7 -Dr. Welton Flakes aware of status, appreciate input - see pulm sxn   INFECTIOUS A:   Diffuse Bilateral Infiltrates  P:   -empiric abx,  atypical coverage -antiviral coverage with valacyclovir - f/u cultures -bronch 1/30 for bacterial, viral cutlure  ENDOCRINE A:  Hyperglycemia  P:   -SSI  NEUROLOGIC A:   - Alert and oriented, agitated.  P:   -Will offer sedation to relax patient and increase ventilator compliance.      Joneen Roach NP-S/  Anders Simmonds ACNP-BC [facility] Pulmonary/Critical Care Pager # [phone] OR # [phone] if no answer

## 2012-12-31 NOTE — Progress Notes (Addendum)
INITIAL NUTRITION ASSESSMENT  DOCUMENTATION CODES Per approved criteria  -Not Applicable   INTERVENTION: 1) Initiate Oxepa @ 20 ml/hr via OG tube and increase by 10 ml every 4 hours to goal rate of 30 ml/hr. 30 ml Prostat tid.  At goal rate, tube feeding regimen will provide 1380 kcal (97% of needs), 90 grams of protein, and 565 ml of H2O.  2) MVI Daily d/t tube feeding being <1L/24hrs   NUTRITION DIAGNOSIS: Inadequate oral intake related to inability to eat as evidenced by NPO status.   Goal: Pt to meet >/= 90% of their estimated nutrition needs.  Monitor:  Tube feeding tolerance Weight  Reason for Assessment: Ventilator  67 y.o. female  Admitting Dx: Acute respiratory failure with hypoxia  ASSESSMENT: Patient is currently intubated on ventilator support. [facility] d/t worsening SOB and bilateral PE. Pt was mobile PTA and reports no known wt loss or significant decrease in appetite. Pt completed 22 weeks of chemotherapy on 1/17 to treat breast cancer. Pt expected to be intubated for approximately 1 week per RN. Pt reports feeling hungry. OG tube in place. Spoke with Cindee Lame, NP about starting tube feeding today. NP to enter consult.  MV: 12 Temp:Temp (24hrs), Avg:98.5 F (36.9 C), Min:97.1 F (36.2 C), Max:100.1 F (37.8 C)    Height: Ht Readings from Last 1 Encounters:  12/27/12 5' 1.5" (1.562 m)    Weight: Wt Readings from Last 1 Encounters:  12/31/12 155 lb 3.3 oz (70.4 kg)    Ideal Body Weight: 107.5  % Ideal Body Weight: 135%  Wt Readings from Last 10 Encounters:  12/31/12 155 lb 3.3 oz (70.4 kg)  12/27/12 148 lb (67.132 kg)  12/22/12 146 lb 8 oz (66.452 kg)  12/17/12 145 lb 9.6 oz (66.044 kg)  12/10/12 146 lb 6.4 oz (66.407 kg)  12/03/12 145 lb 4.8 oz (65.908 kg)  11/26/12 145 lb 6.4 oz (65.953 kg)  11/19/12 145 lb 11.2 oz (66.089 kg)  11/18/12 147 lb 4 oz (66.792 kg)  11/12/12 144 lb 8 oz (65.545 kg)    Usual Body Weight:  145lb per records; pt unsure  % Usual Body Weight: 107%  BMI:  Body mass index is 28.85 kg/(m^2).  Estimated Nutritional Needs: Kcal: 1421 Protein: 79-92g Fluid: >1.9L ; normal needs  Skin: WNL  Diet Order: NPO  EDUCATION NEEDS: -No education needs identified at this time   Intake/Output Summary (Last 24 hours) at 12/31/12 1054 Last data filed at 12/31/12 0800  Gross per 24 hour  Intake 1296.75 ml  Output   2055 ml  Net -758.25 ml    Last BM: 1/29   Labs:   Lab 12/31/12 0445 12/30/12 0355 12/29/12 0515  NA 135 133* 136  K 2.5* 3.6 3.1*  CL 96 98 100  CO2 28 30 25   BUN 16 8 9   CREATININE 0.84 0.48* 0.52  CALCIUM 8.4 8.1* 8.2*  MG -- 2.2 --  PHOS -- -- --  GLUCOSE 280* 196* 107*    CBG (last 3)   Basename 12/31/12 0741 12/31/12 0318 12/30/12 2338  GLUCAP 227* 261* 231*   Lab Results  Component Value Date   HGBA1C 9.7* 12/29/2012    Scheduled Meds:   . chlorhexidine  15 mL Mouth Rinse BID  . enoxaparin (LOVENOX) injection  70 mg Subcutaneous Q12H  . furosemide  20 mg Intravenous Once  . insulin aspart  0-15 Units Subcutaneous Q4H  . levalbuterol  1.25 mg Nebulization Q6H  .  levofloxacin (LEVAQUIN) IV  750 mg Intravenous Q24H  . methylPREDNISolone (SOLU-MEDROL) injection  125 mg Intravenous Q6H  . metoprolol tartrate  25 mg Oral BID  . pantoprazole sodium  40 mg Per Tube Daily  . piperacillin-tazobactam (ZOSYN)  IV  3.375 g Intravenous Q8H  . potassium chloride  40 mEq Per Tube Q4H  . valACYclovir  500 mg Oral TID  . vancomycin  750 mg Intravenous Q12H    Continuous Infusions:   . fentaNYL infusion INTRAVENOUS 50 mcg/hr (12/30/12 1250)    Past Medical History  Diagnosis Date  . Asthma   . GERD (gastroesophageal reflux disease)   . Dysrhythmia     PAT-sees dr Laurence Compton meds  . Breast cancer 07/14/12    inflammatory right breast ca, ER/PR -  . Allergy   . PAT (paroxysmal atrial tachycardia)     hx  . Arthritis     osteopenia,knees  .  PONV (postoperative nausea and vomiting) 09-13-12    severe, with Port-a-cath, was managed without PONV  . Clotting disorder     prothrombin gene mutation-heterozygous   . History of chemotherapy     Last dose 12/17/12.  Was rx'd with Herceptin & Gemzar  . DVT (deep venous thrombosis)     Past Surgical History  Procedure Date  . Dilation and curettage of uterus     x3  . Tonsillectomy   . Colonoscopy   . Portacath placement 07/21/2012    Procedure: INSERTION PORT-A-CATH;  Surgeon: Emelia Loron, MD;  Location: [facility];  Service: General;  Laterality: Left;    Ian Malkin RD, LDN Inpatient Clinical Dietitian Pager: [phone] After Hours Pager: [phone]

## 2012-12-31 NOTE — Progress Notes (Addendum)
PULMONARY  / CRITICAL CARE MEDICINE  Name: Jessica Wells MRN: [id number] DOB: 02-17-1946    ADMISSION DATE:  12/27/2012 CONSULTATION DATE:  12/30/12   REFERRING MD :  Dr. Malachi Bonds - [facility]  CHIEF COMPLAINT:  Acute Respiratory Failure  BRIEF PATIENT DESCRIPTION: 67 y/o F, former smoker, with PMH of GERD, Asthma, inflammatory breast cancer who completed 22 weeks of Gemzar & Herceptin on 1/17 (followed by Dr. Welton Flakes).  Diagnosed with DVT on 1/24 and Rx'd with lovenox. Coag panel was positive for prothrombin gene mutation - heterozygous.   Seen in cancer center on 1/27 and was noted to be grey with decreased saturations to 70% on RA. Transferred to [facility] ER with CTA positive for PE (small / mod clot burden) and IVC filter was placed.  Significant diffuse bilateral infiltrates on CT noted. 1/30 pt with continued tachypnea, hypoxemia despite BiPAP and PCCM consulted for respiratory failure.    SIGNIFICANT EVENTS / STUDIES:  1/27 - Admit with 3 day hx worsening shortness of breath from cancer center, decreased energy.  Denies flu-like symptoms  1/27 CTA CHEST>>>positive for bilateral pulmonary embolism, right greater than left. Overall clot burden is felt to be small to  moderate. No CT evidence of right-sided heart failure. Interval development of extensive bilateral heterogeneous opacities with slight upper lobe predominance. Interval development of mediastinal bilateral hilar adenopathy, again nonspecific and while possibly reactive, metastatic disease is not excluded. Interval resolution of right-sided axillary adenopathy and mass like thickening within the right retro alveolar breast.  Possible nondisplaced fracture of the sternum, possibly artifactual due to respiratory artifact.  No discrete associated aggressive osseous lesion.  1/27 ECHO>>>EF 60-65%  LINES / TUBES: OETT 1/30>>> L port-a-cath >>   CULTURES: 1/27 MRSA PCR>>>neg 1/27 BCx2>>> 1/27 UC>>>50k multiple morphotypes  ANTIBIOTICS: Levaquin  1/27>>> Zosyn 1/27>>> Vanco 1/27>>>  HISTORY OF PRESENT ILLNESS: 66 y/o F, former smoker, with PMH of GERD, Asthma, inflammatory breast cancer (Dx'd in 08/2012) who completed 22 weeks of Gemzar & Herceptin on 1/17 (followed by Dr. Welton Flakes).  Diagnosed with DVT on 1/24 and Rx'd with lovenox. Coag panel was completed at time of diagnosis of DVT and was positive for prothrombin gene mutation - heterozygous.   Reports 1/24 began feeling poorly, increased shortness of breath and fatigue.  Chills but no fevers.  Denies cough, sputum production, hemoptysis, skin lesions / rash.  LLE swelling but DVT positive.  Seen in cancer center on 1/27 and was noted to be grey with decreased saturations to 70% on RA. Transferred to [facility] ER with CTA positive for PE (small / mod clot burden) and IVC filter was placed.  Significant diffuse bilateral infiltrates on CT noted. 1/30 pt with continued tachypnea, hypoxemia despite BiPAP and PCCM consulted for respiratory failure.  ECHO with EF 60-65%  1/27.    SUBJECTIVE:  Stable post intubation 1/30  VITAL SIGNS: Temp:  [98.1 F (36.7 C)-100.1 F (37.8 C)] 98.5 F (36.9 C) (01/31 0400) Pulse Rate:  [85-126] 85  (01/31 0600) Resp:  [20-38] 31  (01/31 0600) BP: (85-132)/(54-74) 117/66 mmHg (01/31 0600) SpO2:  [89 %-100 %] 96 % (01/31 0600) FiO2 (%):  [40 %-100 %] 40 % (01/31 0800) Weight:  [70.4 kg (155 lb 3.3 oz)] 70.4 kg (155 lb 3.3 oz) (01/31 0500)  HEMODYNAMICS:    VENTILATOR SETTINGS: Vent Mode:  [-] PRVC FiO2 (%):  [40 %-100 %] 40 % Set Rate:  [25 bmp-35 bmp] 35 bmp Vt Set:  [300 mL-400 mL] 350 mL PEEP:  [  5 cmH20-10 cmH20] 5 cmH20 Pressure Support:  [10 cmH20] 10 cmH20 Plateau Pressure:  [10 cmH20-26 cmH20] 19 cmH20  INTAKE / OUTPUT: Intake/Output      01/30 0701 - 01/31 0700 01/31 0701 - 02/01 0700   I.V. (mL/kg) 329.8 (4.7)    IV Piggyback 789.5    Total Intake(mL/kg) 1119.3 (15.9)    Urine (mL/kg/hr) 1935 (1.1)    Emesis/NG output 120    Total Output  2055    Net -935.8           PHYSICAL EXAMINATION: General:  Intubated and sedated Neuro:  AAOx4, follows commands HEENT:  Mm pink/dry, no JVD Cardiovascular:  s1s2 rrr, no m/r/g Lungs:  resp's shallow, lungs bilaterally coarse with crackles R>L Abdomen:  Round/soft, bsx4 active Musculoskeletal:  No acute deformities.  LLE with swelling 1+ >R Skin:  Pale/warm/dry  LABS:  Lab 12/31/12 0445 12/30/12 1210 12/30/12 0355  HGB 7.1* 7.9* 7.7*  HCT 20.9* 23.4* 22.5*  WBC 5.2 8.3 7.8  PLT 268 292 255    Lab 12/31/12 0445 12/30/12 0355 12/29/12 0515 12/27/12 2204 12/27/12 1136  NA 135 133* 136 131* 124*  K 2.5* 3.6 -- -- --  CL 96 98 100 98 90*  CO2 28 30 25 24 23   GLUCOSE 280* 196* 107* 153* 281*  BUN 16 8 9 10 14   CREATININE 0.84 0.48* 0.52 0.52 0.59  CALCIUM 8.4 8.1* 8.2* 7.5* 8.2*  MG -- 2.2 -- -- --  PHOS -- -- -- -- --    Lab 12/31/12 0459 12/30/12 1613 12/30/12 1148 12/30/12 1130 12/29/12 0031  PHART 7.390 7.378 7.293* 7.365 7.411  PCO2ART 46.2* 52.8* 59.2* 50.6* 39.6  PO2ART 57.0* 69.0* 110.0* 94.2 88.0  HCO3 27.4* 30.4* 27.8* 28.2* 24.6*  TCO2 26.2 29.3 27.1 27.0 23.3  O2SAT 90.6 96.6 98.2 98.8 97.4    Lab 12/27/12 1136  AST 39*  ALT 36*  ALKPHOS 62  BILITOT 0.7  PROT 5.9*  ALBUMIN 2.3*  INR 1.07    Lab 12/27/12 1136  INR 1.07    CXR: 1/31 - diffuse bilateral airspace disease  ASSESSMENT / PLAN:  PULMONARY  A: Acute Respiratory Failure Bilateral Pulmonary Infiltrates - diffuse infiltrates on CT.  DDx includes atypical infection, viral, medication / chemo reaction, has had herpetic outbreaks during chemo as well. Both gemcitabine and herceptin are associated with pulmonary infiltrates and resp failure (rarely).  Less likely volume with essentially normal ECHO. Pulmonary Embolism - bilateral, small to moderate clot burden >> can be a cause for ARDS  P:   -coverage for atypical infection -diuresis as renal function / BP permit -trial of steroids  for infiltrates started 1/30 >> 125mg  q6h x 3 days, then change to ~ 1mg /kg pred -antiviral coverage -scheduled nebs -LMW heparin for PE  CARDIOVASCULAR A:  Tachycardia - in setting of respiratory distress / bilateral infiltrates.  Chemo used can affect LV fxn but ECHO essentially wnl 1/27.   P:  -continue metoprolol  RENAL  A:  Hyponatremia  P:   -monitor, follow BMP  GASTROINTESTINAL A:   NPO - post intubation  P:   -plan start TF in am 1/30 -PPI  HEMATOLOGIC / ONC A:   Anemia Hx of Intermittent Neutropenia with chemo Inflammatory Breast Cancer - status post chemo last dose 1/17  P:  -monitor H/H, consider tx if less than 7 -Dr. Welton Flakes aware of status, appreciate input  INFECTIOUS A:   Diffuse Bilateral Infiltrates  P:   -empiric  abx, atypical coverage (vanco, zosyn, levaquin) -antiviral coverage with valacyclovir -s/p bronch 1/30 for bacterial, viral culture >> await cx data  ENDOCRINE A:  Hyperglycemia  P:   -SSI  NEUROLOGIC A:   Sedation - for intubation  P:   -PRN sedation protocol   Family updated at bedside    I have personally obtained a history, examined the patient, evaluated laboratory and imaging results, formulated the assessment and plan and placed orders.  CRITICAL CARE: The patient is critically ill with multiple organ systems failure and requires high complexity decision making for assessment and support, frequent evaluation and titration of therapies, application of advanced monitoring technologies and extensive interpretation of multiple databases. Critical Care Time devoted to patient care services described in this note is 40 minutes.    Levy Pupa, MD, PhD 12/31/2012, 8:31 AM [facility] [phone] or if no answer [phone]

## 2012-12-31 NOTE — Progress Notes (Signed)
CRITICAL VALUE ALERT  Critical value received:  K+2.5  Date of notification:  12/31/12  Time of notification:  0527  Critical value read back:yes  Nurse who received alert:  M. Cardale Dorer  MD notified (1st page):  Dr. Sherene Sires  Time of first page:  0530  MD notified (2nd page):  Time of second page:  Responding MD:  Dr. Sherene Sires  Time MD responded:  [phone]

## 2012-12-31 NOTE — Progress Notes (Signed)
[facility]  [phone]   DIAGNOSIS: 67 year old female with new diagnosis of inflammatory breast cancer   PRIOR THERAPY:   #1 patient was originally seen inclinic for new diagnosis of inflammatory breast cancer she was referred by Dr. Emelia Loron. She had a mammogram performed That showed an abnormality. That was biopsied and it showed an invasive mammary carcinoma with lymphovascular invasion grade 3 ER negative PR negative HER-2/neu positive.  #2 patient has gone on to have MRI of the breasts performed the MRI shows diffuse right breast neoplasm on a large level I right axillary lymph nodes compatible with lymphatic spread.Patient has had a biopsy of the right axillary lymph node that is compatible with invasive mammary carcinoma.  #3 patient began neoadjuvant FEC 100 with day 2 Neulasta on 07/23/2012  #4 Weekly neoadjuvant Taxol and Herceptin starting 09/17/12, patient developed neuropathies, and taxol was discontinued early and patient received 2 weeks of herceptin only. She completed 7 weeks of Taxol/Herceptin combination therapy.  #5 Weekly Gemcitabine and Herceptin starting on 11/19/12   CURRENT THERAPY: Herceptin/Gemcitabine cycle 5 12/17/12--therapy completed after 5 cycles   HOSPITAL PROGRESS NOTE  Events since 1/27 noted. Patient was diagnosed per CT angio on 1/27 with bilateral PE despite being on Lovenox 70 mg sq bid for Bilateral DVT as an outpatient. At the time, a Hypercoagulable panel had been drawn which was positive for prothrombin gene mutation-heterozygous. She had an IVC filter placement on 1/27 by IR without complications and continued with full dose  Lovenox per pharmacy. Status was complicated by acute hypoxic respiratory failure likely due to PE and Pneumonia on BiPap and IV antibiotics with Vanc, Zosyn and Levofloxacin.  She continues to have significant shortness of breath. In addition, she had episodes of PAT resolved with vagal  maneuvers.ECHO from 1/27 did not demonstrate right or left heart failure or diastolic dysfunction. Ef was 60-65 percent.  Her dyspnea continued to worsen and she required intubation and ventilator support.  She also underwent bronchoscopy with BAL on 1/30.   Today in the room she seems alert, and is writing about the events of last Friday and how quickly she went downhill.  Her family is at her bedside.   MEDICATIONS:    . chlorhexidine  15 mL Mouth Rinse BID  . enoxaparin (LOVENOX) injection  70 mg Subcutaneous Q12H  . feeding supplement (OXEPA)  1,000 mL Per Tube Q24H  . feeding supplement  30 mL Per Tube TID  . furosemide  20 mg Intravenous Once  . insulin aspart  0-15 Units Subcutaneous Q4H  . levalbuterol  1.25 mg Nebulization Q6H  . levofloxacin (LEVAQUIN) IV  750 mg Intravenous Q24H  . methylPREDNISolone (SOLU-MEDROL) injection  125 mg Intravenous Q6H  . metoprolol tartrate  25 mg Oral BID  . multivitamin  5 mL Per Tube Daily  . pantoprazole sodium  40 mg Per Tube Daily  . piperacillin-tazobactam (ZOSYN)  IV  3.375 g Intravenous Q8H  . valACYclovir  500 mg Oral TID  . vancomycin  750 mg Intravenous Q12H    ALLERGIES:   Allergies  Allergen Reactions  . Anesthetics, Amide Nausea And Vomiting    Projectile vomitting-Nausea- with 24hrs of dry heaves. With any anesthetics  . Bactrim (Sulfamethoxazole W-Trimethoprim) Other (See Comments)    Massive diarrhea, nausea vomiting, platelets dropped, hemoglobin dropped, admitted to hospital  . Sulfa Antibiotics Nausea And Vomiting and Other (See Comments)    Crazy feeling in head Life threatening reaction, decreased blood  counts  . Codeine Nausea And Vomiting  . Codeine Phosphate Nausea And Vomiting  . Eggs Or Egg-Derived Products Hives     PHYSICAL EXAMINATION:   Filed Vitals:   12/31/12 1500  BP: 135/78  Pulse: 94  Temp:   Resp: 39     67 year old  in  Moderate respiatory discomfort, ill appearing. General  well-developed and well-nourished  HEENT: Normocephalic, atraumatic, PERRLA. Intubated. Lungs .No wheezing, coarse with crackles, shallow breathing Cardiac: regular rate and rhythm,no murmur , rubs or gallops Abdomen soft nontender , bowel sounds x4. No HSM Extremities no clubbing cyanosis, bilateral pitting edema, L 2+, R 1 + No bruising or petechial rash Neuro: non focal  LABORATORY/RADIOLOGY DATA:   Lab 12/31/12 0445 12/30/12 1210 12/30/12 0355 12/29/12 0515 12/27/12 2204 12/27/12 1136 12/27/12 1048  WBC 5.2 8.3 7.8 8.1 6.2 -- --  HGB 7.1* 7.9* 7.7* 8.6* 9.0* -- --  HCT 20.9* 23.4* 22.5* 24.7* 25.2* -- --  PLT 268 292 255 236 154 -- --  MCV 93.3 94.7 93.4 92.5 91.3 -- --  MCH 31.7 32.0 32.0 32.2 32.6 -- --  MCHC 34.0 33.8 34.2 34.8 35.7 -- --  RDW 16.1* 16.3* 16.4* 16.3* 16.4* -- --  LYMPHSABS -- -- -- -- 0.3* 0.4* 0.5*  MONOABS -- -- -- -- 0.2 0.3 0.4  EOSABS -- -- -- -- 0.0 0.0 0.0  BASOSABS -- -- -- -- 0.0 0.0 0.0  BANDABS -- -- -- -- -- -- --    CMP    Lab 12/31/12 1532 12/31/12 0445 12/30/12 0355 12/29/12 0515 12/27/12 2204 12/27/12 1136  NA 137 135 133* 136 131* --  K 3.5 2.5* 3.6 3.1* 3.6 --  CL 97 96 98 100 98 --  CO2 30 28 30 25 24  --  GLUCOSE 267* 280* 196* 107* 153* --  BUN 19 16 8 9 10  --  CREATININE 0.93 0.84 0.48* 0.52 0.52 --  CALCIUM 8.6 8.4 8.1* 8.2* 7.5* --  MG -- -- 2.2 -- -- --  AST -- -- -- -- -- 39*  ALT -- -- -- -- -- 36*  ALKPHOS -- -- -- -- -- 62  BILITOT -- -- -- -- -- 0.7        Component Value Date/Time   BILITOT 0.7 12/27/2012 1136   BILITOT 0.46 12/17/2012 1049    Anemia panel:  No results found for this basename: VITAMINB12:2,FOLATE:2,FERRITIN:2,TIBC:2,IRON:2,RETICCTPCT:2 in the last 72 hours  No results found for this basename: TSH,T4TOTAL,FREET3,T3FREE,THYROIDAB in the last 72 hours   No results found for this basename: esrsedrate     Lab 12/27/12 1136  INR 1.07  PROTIME --      Urinalysis    Component Value  Date/Time   COLORURINE YELLOW 12/30/2012 1230   APPEARANCEUR CLEAR 12/30/2012 1230   LABSPEC 1.007 12/30/2012 1230   PHURINE 5.5 12/30/2012 1230   GLUCOSEU NEGATIVE 12/30/2012 1230   HGBUR TRACE* 12/30/2012 1230   BILIRUBINUR NEGATIVE 12/30/2012 1230   KETONESUR NEGATIVE 12/30/2012 1230   PROTEINUR NEGATIVE 12/30/2012 1230   UROBILINOGEN 0.2 12/30/2012 1230   NITRITE NEGATIVE 12/30/2012 1230   LEUKOCYTESUR NEGATIVE 12/30/2012 1230    Drugs of Abuse  No results found for this basename: labopia,  cocainscrnur,  labbenz,  amphetmu,  thcu,  labbarb     Liver Function Tests:  Lab 12/27/12 1136  AST 39*  ALT 36*  ALKPHOS 62  BILITOT 0.7  PROT 5.9*  ALBUMIN 2.3*   No results found for this basename:  LIPASE:5,AMYLASE:5 in the last 168 hours No results found for this basename: AMMONIA:5 in the last 168 hours  CBG:  Lab 12/31/12 1530 12/31/12 1120 12/31/12 0741 12/31/12 0318 12/30/12 2338  GLUCAP 263* 255* 227* 261* 231*   Hgb A1c  Basename 12/29/12 0515  HGBA1C 9.7*     Radiology Studies:   Ct Angio Chest Pe W/cm &/or Wo Cm  12/27/2012  *RADIOLOGY REPORT*  Clinical Data: Pleuritic chest pain, hypoxemia, tachycardia, history of breast cancer with new diagnosis of DVT, evaluate for pulmonary embolism  CT ANGIOGRAPHY CHEST  Technique:  Multidetector CT imaging of the chest using the standard protocol during bolus administration of intravenous contrast. Multiplanar reconstructed images including MIPs were obtained and reviewed to evaluate the vascular anatomy.  Contrast: OMNIPAQUE IOHEXOL 350 MG/ML SOLN  Comparison: CT chest, abdomen pelvis - 07/26/2012; PET CT - 07/26/2012; chest radiograph - earlier same day  Vascular Findings:  There is suboptimal opacification of the pulmonary arterial system of the main pulmonary artery measuring only 217 HU.  Given this limitation, there is are discrete filling defects within the peripheral aspect of the right main pulmonary artery (image 114,  series 5) with nonocclusive thrombus extending into nearly all major segmental branches of the right lung.  There is a there is nonocclusive thrombus within the left anterior/medial basilar segmental pulmonary artery of the left lower lobe (image 122, series five).  The overall clot burden is felt to be small to moderate.  Caliber of the main pulmonary artery is normal measuring 3 cm in diameter.  There is no definitive bowing of the interventricular septum.  There is no reflux of injected contrast into the hepatic venous system.  Normal heart size.  Trace amount of pericardial fluid, possibly physiologic.  Normal caliber thoracic aorta.  Left anterior chest wall port catheter tip terminates within the distal SVC.   Nonvascular findings:  There has been interval development of rather extensive ground- glass opacities within the bilateral lungs with slight apical predominance.  This represents a marked change from prior chest CT performed 07/26/2012.  No discrete air bronchograms.  Central pulmonary airways are patent.  No pleural effusion or pneumothorax.  Interval development of mediastinal and bilateral hilar adenopathy with index pretracheal node measuring 1.1 cm in short axis diameter (image 26, series 4, right hilar nodal the lung aeration measuring approximately 1.2 cm (image 36) and left hilar nodal conglomeration measuring approximately 0.9 cm (image 27).  Interval resolution of right-sided axillary adenopathy.  Limited early arterial phase evaluation of the upper abdomen suggests decreased attenuation of the hepatic parenchyma suggestive of hepatic steatosis.  Hiatal hernia.  Evaluation of the sternum is degraded secondary to streak artifact and patient motion, however there is a possible nondisplaced fracture involving the mid aspect of the sternum (image 78, series nine).  No discrete aggressive osseous lesions.  Decreased conspicuity of previously noted right-sided retroareolar mass.  IMPRESSION:  1.   Examination is positive for bilateral pulmonary embolism, right greater than left. Overall clot burden is felt to be small to moderate.  No CT evidence of right-sided heart failure.  2.  Interval development of extensive bilateral heterogeneous opacities with slight upper lobe predominance.  Differential considerations are broad and include drug toxicity, though note, atypical infection may have a similar appearance. 3.  Interval development of mediastinal bilateral hilar adenopathy, again nonspecific and while possibly reactive, metastatic disease is not excluded.  4.  Interval resolution of right-sided axillary adenopathy and mass like thickening within  the right retro alveolar breast.  5. Possible nondisplaced fracture of the sternum, possibly artifactual due to respiratory artifact.  Correlation for point tenderness at this location is recommended.  No discrete associated aggressive osseous lesion.  Above findings discussed with Dr. Lorenso Courier at [phone].   Original Report Authenticated By: Tacey Ruiz, MD    Ir Ivc Filter Plmt / S&i /img Guid/mod Sed  12/28/2012  *RADIOLOGY REPORT*  Clinical Data:  Lower extremity DVT, pulmonary emboli, breast cancer  ULTRASOUND GUIDANCE FOR VASCULAR ACCESS IVC CATHETERIZATION AND VENOGRAM IVC FILTER INSERTION  Date:  12/27/2012 17:40:00  Radiologist:  M. Ruel Favors, M.D.  Medications:  1 mg Versed, 50 mcg Fentanyl  Guidance:  Ultrasound fluoroscopic  Fluoroscopy time:  1.8 minutes  Sedation time:  30 minutes  Contrast Volume:  40 ml Omnipaque-300  Complications:  No immediate  PROCEDURE/FINDINGS:  Informed consent was obtained from the patient following explanation of the procedure, risks, benefits and alternatives. The patient understands, agrees and consents for the procedure. All questions were addressed.  A time out was performed.  Maximal barrier sterile technique utilized including caps, mask, sterile gowns, sterile gloves, large sterile drape, hand hygiene, and betadine  prep.  Under sterile condition and local anesthesia, right internal jugular venous access was performed with ultrasound.  Over a guide wire, the IVC filter delivery sheath and inner dilator were advanced into the IVC just above the IVC bifurcation.  Contrast injection was performed for an IVC venogram.  IVC VENOGRAM:  The IVC is patent.  No evidence of thrombus, stenosis, or occlusion.  No variant venous anatomy.  The renal veins are identified at L1-2.  IVC FILTER INSERTION:  Through the delivery sheath, the Cordis Celect IVC filter was deployed in the infrarenal IVC at the L3 level just below the renal veins and above the IVC bifurcation. Contrast injection confirmed position.  There is good apposition of the filter against the IVC.  The delivery sheath was removed and hemostasis was obtained with compression for 5 minutes.  The patient tolerated the procedure well.  No immediate complications.  IMPRESSION: Ultrasound and fluoroscopically guided infrarenal IVC filter insertion.   Original Report Authenticated By: Judie Petit. Miles Costain, M.D.   Dg Chest Port 1 View  12/30/2012  *RADIOLOGY REPORT*  Clinical Data: Evaluate endotracheal tube  PORTABLE CHEST - 1 VIEW  Comparison: 12/30/2012  Findings: An endotracheal tube is in place and the tip is located 1.7 cm above the level of the carina.  An orogastric tube is in place and the tip is located below the level of the hemidiaphragm but not included on this exam.  A left subclavian CVP is stable in location.  Heart and mediastinal contours are unchanged.  Bilateral diffuse alveolar infiltrates are seen with a perihilar distribution and some increase in central peribronchial cuffing. There may be a small left pleural effusion and suggestion of some interstitial septal lines is noted at the right base.  The appearance is suspicious for pulmonary edema with a noncardiogenic etiology suspected given the normal heart size. Multifocal pneumonia could have a similar appearance and  clinical correlation is needed.  IMPRESSION: Lines and tubes as above with endotracheal tube position as noted. Little interval change in the bilateral alveolar infiltrative pattern suspicious for noncardiogenic pulmonary edema with a diffuse infectious process not excluded in the appropriate clinical setting   Original Report Authenticated By: Rhodia Albright, M.D.    Dg Chest Port 1 View  12/30/2012  *RADIOLOGY REPORT*  Clinical Data: Evaluate  for pulmonary edema.  PORTABLE CHEST - 1 VIEW  Comparison: Chest x-ray 12/27/2012.  Findings: Left subclavian single Port-A-Cath with tip terminating in the superior cavoatrial junction.  Increasing diffuse interstitial and patchy airspace disease, most confluent in the right upper lobe.  No definite pleural effusions.  Pulmonary vasculature appears engorged, but is largely obscured.  Heart size is upper limits of normal.  Mediastinal contours are largely obscured.  IMPRESSION: 1.  Significantly worsened aeration related to increasing interstitial and patchy air space disease throughout the lungs bilaterally.  This is relatively symmetric, suggesting some degree of pulmonary edema, however, the more confluent opacity in the right upper lobe could be concerning for pneumonia.  Clinical correlation is recommended.   Original Report Authenticated By: Trudie Reed, M.D.        ASSESSMENT AND PLAN:   #1 inflammatory right breast carcinoma with positive lymph nodes the tumor is ER negative PR negative HER-2/neu positive. Patient has completed neoadjuvant chemotherapy.  Dr. Dwain Sarna to reschedule mastectomy  when patient released from hospital.Plans for radiation (Dr. Michell Heinrich) are delayed as well.  2. Bilateral DVT and PE , Positive Prothrombin Gene Mutation, heterozygous. S/p  IVC filter 12/27/12. Lovenox Full dose.  3.HCAP/ Acute Hypoxic Respiratory failure: Intubated and on ventilator per critical care team.  4. Fever/pneumonia, on Vancomycin, Zosyn, and  Levaquin.  Bronchoscopy done yesterday.   5. Code status: FULL CODE.  Cherie Ouch Lyn Hollingshead, NP Medical Oncology [facility] Phone: [phone] Augustin Schooling 12/31/2012, 4:52 PM

## 2013-01-01 ENCOUNTER — Inpatient Hospital Stay (HOSPITAL_COMMUNITY): Payer: Medicare Other

## 2013-01-01 LAB — CULTURE, BAL-QUANTITATIVE W GRAM STAIN: Culture: NO GROWTH

## 2013-01-01 LAB — GLUCOSE, CAPILLARY
Glucose-Capillary: 140 mg/dL — ABNORMAL HIGH (ref 70–99)
Glucose-Capillary: 308 mg/dL — ABNORMAL HIGH (ref 70–99)
Glucose-Capillary: 315 mg/dL — ABNORMAL HIGH (ref 70–99)
Glucose-Capillary: 321 mg/dL — ABNORMAL HIGH (ref 70–99)

## 2013-01-01 LAB — CBC
Hemoglobin: 7.4 g/dL — ABNORMAL LOW (ref 12.0–15.0)
MCH: 31.5 pg (ref 26.0–34.0)
MCHC: 33.5 g/dL (ref 30.0–36.0)
MCV: 94 fL (ref 78.0–100.0)
RBC: 2.35 MIL/uL — ABNORMAL LOW (ref 3.87–5.11)

## 2013-01-01 LAB — BASIC METABOLIC PANEL
BUN: 30 mg/dL — ABNORMAL HIGH (ref 6–23)
CO2: 31 mEq/L (ref 19–32)
Calcium: 8.6 mg/dL (ref 8.4–10.5)
Creatinine, Ser: 1 mg/dL (ref 0.50–1.10)
GFR calc non Af Amer: 57 mL/min — ABNORMAL LOW (ref >90)
Glucose, Bld: 307 mg/dL — ABNORMAL HIGH (ref 70–99)

## 2013-01-01 MED ORDER — BIOTENE DRY MOUTH MT LIQD
15.0000 mL | Freq: Four times a day (QID) | OROMUCOSAL | Status: DC
  Administered 2013-01-01 – 2013-01-05 (×15): 15 mL via OROMUCOSAL

## 2013-01-01 MED ORDER — POTASSIUM CHLORIDE 20 MEQ/15ML (10%) PO LIQD
40.0000 meq | Freq: Two times a day (BID) | ORAL | Status: DC
  Administered 2013-01-01 (×2): 40 meq
  Filled 2013-01-01 (×3): qty 30

## 2013-01-01 MED ORDER — INSULIN GLARGINE 100 UNIT/ML ~~LOC~~ SOLN
15.0000 [IU] | Freq: Every day | SUBCUTANEOUS | Status: DC
  Administered 2013-01-01 – 2013-01-04 (×4): 15 [IU] via SUBCUTANEOUS

## 2013-01-01 MED ORDER — FUROSEMIDE 10 MG/ML IJ SOLN
40.0000 mg | Freq: Two times a day (BID) | INTRAMUSCULAR | Status: DC
  Administered 2013-01-01 – 2013-01-02 (×3): 40 mg via INTRAVENOUS
  Filled 2013-01-01 (×5): qty 4

## 2013-01-01 MED ORDER — INSULIN ASPART 100 UNIT/ML ~~LOC~~ SOLN
6.0000 [IU] | Freq: Three times a day (TID) | SUBCUTANEOUS | Status: DC
  Administered 2013-01-01 – 2013-01-02 (×4): 6 [IU] via SUBCUTANEOUS

## 2013-01-01 NOTE — Progress Notes (Signed)
PULMONARY  / CRITICAL CARE MEDICINE  Name: Jessica Wells MRN: [id number] DOB: Dec 06, 1945    ADMISSION DATE:  12/27/2012 CONSULTATION DATE:  12/30/12   REFERRING MD :  Dr. Malachi Bonds - [facility]  CHIEF COMPLAINT:  Acute Respiratory Failure  BRIEF PATIENT DESCRIPTION: 67 y/o F, former smoker, with PMH of GERD, Asthma, inflammatory breast cancer (10/13) who completed 22 weeks of Gemzar & Herceptin on 1/17 (followed by Dr. Welton Flakes).  Diagnosed with DVT on 1/24 and Rx'd with lovenox. Coag panel was positive for prothrombin gene mutation - heterozygous.   Seen in cancer center on 1/27 and was noted to be grey with decreased saturations to 70% on RA. Transferred to [facility] ER with CTA positive for PE (small / mod clot burden) and IVC filter was placed.  Significant diffuse bilateral infiltrates on CT noted.  1/30 pt with continued tachypnea, hypoxemia despite BiPAP and PCCM consulted for respiratory failure.    SIGNIFICANT EVENTS / STUDIES:  1/27 - Admit with 3 day hx worsening shortness of breath from cancer center, decreased energy.  Denies flu-like symptoms  1/27 CTA CHEST>>>positive for bilateral pulmonary embolism, right greater than left. Overall clot burden is felt to be small to  moderate. No CT evidence of right-sided heart failure. Interval development of extensive bilateral heterogeneous opacities with slight upper lobe predominance. Interval development of mediastinal bilateral hilar adenopathy, again nonspecific and while possibly reactive, metastatic disease is not excluded. Interval resolution of right-sided axillary adenopathy and mass like thickening within the right retro alveolar breast.  Possible nondisplaced fracture of the sternum, possibly artifactual due to respiratory artifact.  No discrete associated aggressive osseous lesion.  1/27 ECHO>>>EF 60-65%  LINES / TUBES: OETT 1/30>>> L port-a-cath >>   CULTURES: 1/27 MRSA PCR>>>neg 1/27 BCx2>>>ng 1/27 UC>>>50k multiple  morphotypes  ANTIBIOTICS: Levaquin 1/27>>> Zosyn 1/27>>> Vanco 1/27>>>2/1   SUBJECTIVE:  Denies cp, dyspnea, afebrile  VITAL SIGNS: Temp:  [97.1 F (36.2 C)-97.8 F (36.6 C)] 97.6 F (36.4 C) (02/01 0400) Pulse Rate:  [80-114] 97  (02/01 0600) Resp:  [27-38] 31  (02/01 0600) BP: (102-155)/(59-98) 146/98 mmHg (02/01 0600) SpO2:  [93 %-100 %] 97 % (02/01 0600) FiO2 (%):  [40 %] 40 % (02/01 0333) Weight:  [69.8 kg (153 lb 14.1 oz)] 69.8 kg (153 lb 14.1 oz) (02/01 0300)  HEMODYNAMICS:    VENTILATOR SETTINGS: Vent Mode:  [-] PRVC FiO2 (%):  [40 %] 40 % Set Rate:  [35 bmp] 35 bmp Vt Set:  [350 mL] 350 mL PEEP:  [5 cmH20] 5 cmH20 Pressure Support:  [10 cmH20] 10 cmH20 Plateau Pressure:  [17 cmH20-29 cmH20] 23 cmH20  INTAKE / OUTPUT: Intake/Output      01/31 0701 - 02/01 0700 02/01 0701 - 02/02 0700   I.V. (mL/kg) 445 (6.4)    NG/GT 330    IV Piggyback 675    Total Intake(mL/kg) 1450 (20.8)    Urine (mL/kg/hr) 2320 (1.4)    Emesis/NG output     Total Output 2320    Net -870         Stool Occurrence 2 x      PHYSICAL EXAMINATION: General:  Intubated and sedated Neuro:  AAOx4, follows commands HEENT:  Mm pink/dry, no JVD Cardiovascular:  s1s2 rrr, no m/r/g Lungs:  resp's shallow, lungs bilaterally coarse with crackles R>L Abdomen:  Round/soft, bsx4 active Musculoskeletal:  No acute deformities.  LLE with swelling 1+ >R Skin:  Pale/warm/dry  LABS:  Lab 01/01/13 0430 12/31/12 0445 12/30/12 1210  HGB  7.4* 7.1* 7.9*  HCT 22.1* 20.9* 23.4*  WBC 8.4 5.2 8.3  PLT 331 268 292    Lab 01/01/13 0430 12/31/12 1532 12/31/12 0445 12/30/12 0355 12/29/12 0515  NA 137 137 135 133* 136  K 3.2* 3.5 -- -- --  CL 99 97 96 98 100  CO2 31 30 28 30 25   GLUCOSE 307* 267* 280* 196* 107*  BUN 30* 19 16 8 9   CREATININE 1.00 0.93 0.84 0.48* 0.52  CALCIUM 8.6 8.6 8.4 8.1* 8.2*  MG -- -- -- 2.2 --  PHOS -- -- -- -- --    Lab 12/31/12 0459 12/30/12 1613 12/30/12 1148 12/30/12  1130 12/29/12 0031  PHART 7.390 7.378 7.293* 7.365 7.411  PCO2ART 46.2* 52.8* 59.2* 50.6* 39.6  PO2ART 57.0* 69.0* 110.0* 94.2 88.0  HCO3 27.4* 30.4* 27.8* 28.2* 24.6*  TCO2 26.2 29.3 27.1 27.0 23.3  O2SAT 90.6 96.6 98.2 98.8 97.4    Lab 12/27/12 1136  AST 39*  ALT 36*  ALKPHOS 62  BILITOT 0.7  PROT 5.9*  ALBUMIN 2.3*  INR 1.07    Lab 12/27/12 1136  INR 1.07    CXR: 1/31 - diffuse bilateral airspace disease  ASSESSMENT / PLAN:  PULMONARY  A: Acute Respiratory Failure Bilateral Pulmonary Infiltrates - diffuse infiltrates on CT.  DDx includes atypical infection, viral, medication / chemo reaction, has had herpetic outbreaks during chemo as well. Both gemcitabine and herceptin are associated with pulmonary infiltrates and resp failure (rarely).  Less likely volume with essentially normal ECHO. Pulmonary Embolism - bilateral, small to moderate clot burden >> can be a cause for ARDS  P:   -Lower RR to 20, start SBTs -ABX -diuresis as renal function / BP permit -trial of steroids for infiltrates started 1/30 >> 125mg  q6h x 3 days, then change to ~ 1mg /kg pred -antiviral coverage -scheduled nebs -LMW heparin for PE  CARDIOVASCULAR A:  Tachycardia - in setting of respiratory distress / bilateral infiltrates.  Chemo used can affect LV fxn but ECHO essentially wnl 1/27.   P:  -continue metoprolol  RENAL  A:  Hyponatremia Hypokalemia  P:   -replete, follow BMP on lasix 40 q 12  GASTROINTESTINAL A:   NPO - post intubation  P:   -ct TFs -PPI  HEMATOLOGIC / ONC A:   Anemia Hx of Intermittent Neutropenia with chemo Inflammatory Breast Cancer - status post chemo last dose 1/17  P:  -monitor H/H, consider tx if less than 7 -Dr. Welton Flakes aware of status, appreciate input  INFECTIOUS A:   Diffuse Bilateral Infiltrates  P:   -empiric abx, atypical coverage ( zosyn, levaquin) - dc vanc  -antiviral coverage with valacyclovir -s/p bronch 1/30 for bacterial,  viral culture >> await cx data  ENDOCRINE A:  Hyperglycemia  P:   -SSI -Add lantus 15 units + TF coverage 6U  NEUROLOGIC A:   Sedation - for intubation  P:   -fent gtt   Family updated at bedside    I have personally obtained a history, examined the patient, evaluated laboratory and imaging results, formulated the assessment and plan and placed orders.  CRITICAL CARE: The patient is critically ill with multiple organ systems failure and requires high complexity decision making for assessment and support, frequent evaluation and titration of therapies, application of advanced monitoring technologies and extensive interpretation of multiple databases. Critical Care Time devoted to patient care services described in this note is 35 minutes.    Cyril Mourning MD. Tonny Bollman. [facility] Pager [phone] If no response call [phone]    01/01/2013, 7:40 AM

## 2013-01-02 ENCOUNTER — Inpatient Hospital Stay (HOSPITAL_COMMUNITY): Payer: Medicare Other

## 2013-01-02 LAB — CBC
HCT: 25.2 % — ABNORMAL LOW (ref 36.0–46.0)
Hemoglobin: 8.4 g/dL — ABNORMAL LOW (ref 12.0–15.0)
MCH: 31.7 pg (ref 26.0–34.0)
MCHC: 33.3 g/dL (ref 30.0–36.0)
MCV: 95.1 fL (ref 78.0–100.0)

## 2013-01-02 LAB — CULTURE, BLOOD (ROUTINE X 2): Culture: NO GROWTH

## 2013-01-02 LAB — BASIC METABOLIC PANEL
BUN: 41 mg/dL — ABNORMAL HIGH (ref 6–23)
Creatinine, Ser: 1.09 mg/dL (ref 0.50–1.10)
GFR calc non Af Amer: 51 mL/min — ABNORMAL LOW (ref >90)
Glucose, Bld: 316 mg/dL — ABNORMAL HIGH (ref 70–99)
Potassium: 3.3 mEq/L — ABNORMAL LOW (ref 3.5–5.1)

## 2013-01-02 LAB — GLUCOSE, CAPILLARY
Glucose-Capillary: 311 mg/dL — ABNORMAL HIGH (ref 70–99)
Glucose-Capillary: 224 mg/dL — ABNORMAL HIGH (ref 70–99)
Glucose-Capillary: 221 mg/dL — ABNORMAL HIGH (ref 70–99)

## 2013-01-02 MED ORDER — POTASSIUM CHLORIDE 20 MEQ/15ML (10%) PO LIQD
40.0000 meq | ORAL | Status: AC
  Administered 2013-01-02 (×2): 40 meq
  Filled 2013-01-02 (×2): qty 30

## 2013-01-02 MED ORDER — METHYLPREDNISOLONE SODIUM SUCC 40 MG IJ SOLR
40.0000 mg | Freq: Two times a day (BID) | INTRAMUSCULAR | Status: DC
  Administered 2013-01-02 – 2013-01-04 (×4): 40 mg via INTRAVENOUS
  Filled 2013-01-02 (×6): qty 1

## 2013-01-02 MED ORDER — LEVOFLOXACIN IN D5W 750 MG/150ML IV SOLN
750.0000 mg | INTRAVENOUS | Status: DC
  Filled 2013-01-02: qty 150

## 2013-01-02 MED ORDER — FUROSEMIDE 10 MG/ML IJ SOLN
20.0000 mg | Freq: Two times a day (BID) | INTRAMUSCULAR | Status: DC
  Administered 2013-01-02: 20 mg via INTRAVENOUS
  Filled 2013-01-02 (×2): qty 2

## 2013-01-02 MED ORDER — METOPROLOL TARTRATE 25 MG PO TABS
25.0000 mg | ORAL_TABLET | Freq: Two times a day (BID) | ORAL | Status: DC
  Administered 2013-01-02 – 2013-01-05 (×6): 25 mg via ORAL
  Filled 2013-01-02 (×8): qty 1

## 2013-01-02 NOTE — Progress Notes (Signed)
eLink Physician-Brief Progress Note Patient Name: Tess Potts DOB: [birth year] MRN: [id number]  Date of Service  01/02/2013   HPI/Events of Note  Hypokalemia   eICU Interventions  Potassium replaced   Intervention Category Minor Interventions: Electrolytes abnormality - evaluation and management  Tanaiya Kolarik 01/02/2013, 5:50 AM

## 2013-01-02 NOTE — Progress Notes (Signed)
Pt extubated to 3L [place] @ 0845 - no stridor, diminished BBS, hr 123, bp 144/87, sats 93% - no distress.  RT will continue to monitor.

## 2013-01-02 NOTE — Plan of Care (Signed)
Problem: Phase II Progression Outcomes Goal: Time pt extubated/weaned off vent Outcome: Completed/Met Date Met:  01/02/13 Extubated 0845 am.

## 2013-01-02 NOTE — Progress Notes (Signed)
PULMONARY  / CRITICAL CARE MEDICINE  Name: Jessica Wells MRN: [id number] DOB: July 27, 1946    ADMISSION DATE:  12/27/2012 CONSULTATION DATE:  12/30/12   REFERRING MD :  Dr. Malachi Bonds - [facility]  CHIEF COMPLAINT:  Acute Respiratory Failure  BRIEF PATIENT DESCRIPTION: 67 y/o F, former smoker, with PMH of GERD, Asthma, inflammatory breast cancer (10/13) who completed 22 weeks of Gemzar & Herceptin on 1/17 (followed by Dr. Welton Flakes).  Diagnosed with DVT on 1/24 and Rx'd with lovenox. Coag panel was positive for prothrombin gene mutation - heterozygous.   Seen in cancer center on 1/27 and was noted to be grey with decreased saturations to 70% on RA. Transferred to [facility] ER with CTA positive for PE (small / mod clot burden) and IVC filter was placed.  Significant diffuse bilateral infiltrates on CT noted.  1/30 pt with continued tachypnea, hypoxemia despite BiPAP and PCCM consulted for respiratory failure.    SIGNIFICANT EVENTS / STUDIES:  1/27 - Admit with 3 day hx worsening shortness of breath from cancer center, decreased energy.  Denies flu-like symptoms  1/27 CTA CHEST>>>positive for bilateral pulmonary embolism, right greater than left. Overall clot burden is felt to be small to moderate. No CT evidence of right-sided heart failure. Interval development of extensive bilateral heterogeneous opacities with slight upper lobe predominance. Interval development of mediastinal bilateral hilar adenopathy, again nonspecific and while possibly reactive, metastatic disease is not excluded. Interval resolution of right-sided axillary adenopathy and mass like thickening within the right retro alveolar breast.  Possible nondisplaced fracture of the sternum, possibly artifactual due to respiratory artifact.  No discrete associated aggressive osseous lesion.  1/27 ECHO>>>EF 60-65%  LINES / TUBES: OETT 1/30>>> L port-a-cath >>   CULTURES: 1/27 MRSA PCR>>>neg 1/27 BCx2>>>ng 1/27 UC>>>50k multiple  morphotypes  ANTIBIOTICS: Levaquin 1/27>>> Zosyn 1/27>>> Vanco 1/27>>>2/1   SUBJECTIVE:  Denies cp, dyspnea, Afebrile Diuresed 5L  VITAL SIGNS: Temp:  [97.1 F (36.2 C)-97.9 F (36.6 C)] 97.6 F (36.4 C) (02/02 0400) Pulse Rate:  [78-115] 115  (02/02 0800) Resp:  [13-31] 25  (02/02 0800) BP: (135-158)/(77-100) 158/100 mmHg (02/02 0800) SpO2:  [94 %-100 %] 98 % (02/02 0600) FiO2 (%):  [40 %] 40 % (02/02 0759) Weight:  [67.7 kg (149 lb 4 oz)] 67.7 kg (149 lb 4 oz) (02/02 0400)  HEMODYNAMICS:    VENTILATOR SETTINGS: Vent Mode:  [-] CPAP FiO2 (%):  [40 %] 40 % Set Rate:  [20 bmp] 20 bmp Vt Set:  [350 mL] 350 mL PEEP:  [5 cmH20] 5 cmH20 Pressure Support:  [5 cmH20] 5 cmH20 Plateau Pressure:  [18 cmH20-28 cmH20] 23 cmH20  INTAKE / OUTPUT: Intake/Output      02/01 0701 - 02/02 0700 02/02 0701 - 02/03 0700   I.V. (mL/kg) 690 (10.2)    NG/GT 720    IV Piggyback 432    Total Intake(mL/kg) 1842 (27.2)    Urine (mL/kg/hr) 5135 (3.2)    Total Output 5135    Net -3293         Stool Occurrence 4 x      PHYSICAL EXAMINATION: General:  Intubated and sedated Neuro:  AAOx4, follows commands HEENT:  Mm pink/dry, no JVD Cardiovascular:  s1s2 rrr, no m/r/g Lungs:  resp's shallow, lungs bilaterally coarse with decreased crackles R>L Abdomen:  Round/soft, bsx4 active Musculoskeletal:  No acute deformities.  LLE with swelling 1+ >R Skin:  Pale/warm/dry  LABS:  Lab 01/02/13 0405 01/01/13 0430 12/31/12 0445  HGB 8.4* 7.4* 7.1*  HCT 25.2* 22.1* 20.9*  WBC 12.2* 8.4 5.2  PLT 377 331 268    Lab 01/02/13 0405 01/01/13 0430 12/31/12 1532 12/31/12 0445 12/30/12 0355  NA 142 137 137 135 133*  K 3.3* 3.2* -- -- --  CL 99 99 97 96 98  CO2 36* 31 30 28 30   GLUCOSE 316* 307* 267* 280* 196*  BUN 41* 30* 19 16 8   CREATININE 1.09 1.00 0.93 0.84 0.48*  CALCIUM 8.9 8.6 8.6 8.4 8.1*  MG -- -- -- -- 2.2  PHOS -- -- -- -- --    Lab 12/31/12 0459 12/30/12 1613 12/30/12 1148  12/30/12 1130 12/29/12 0031  PHART 7.390 7.378 7.293* 7.365 7.411  PCO2ART 46.2* 52.8* 59.2* 50.6* 39.6  PO2ART 57.0* 69.0* 110.0* 94.2 88.0  HCO3 27.4* 30.4* 27.8* 28.2* 24.6*  TCO2 26.2 29.3 27.1 27.0 23.3  O2SAT 90.6 96.6 98.2 98.8 97.4    Lab 12/27/12 1136  AST 39*  ALT 36*  ALKPHOS 62  BILITOT 0.7  PROT 5.9*  ALBUMIN 2.3*  INR 1.07    Lab 12/27/12 1136  INR 1.07    CXR: 2/2 -improved bilateral airspace disease  ASSESSMENT / PLAN:  PULMONARY  A: Acute Respiratory Failure Bilateral Pulmonary Infiltrates - diffuse infiltrates on CT.  DDx includes atypical infection, viral, medication / chemo reaction, has had herpetic outbreaks during chemo as well. Both gemcitabine and herceptin are associated with pulmonary infiltrates and resp failure (rarely).  Less likely volume with essentially normal ECHO. Pulmonary Embolism - bilateral, small to moderate clot burden >> can be a cause for ARDS  P:   -Extubate  -ABX -diuresis as renal function / BP permit -trial of steroids for infiltrates started 1/30 >> 125mg  q6h x 3 days,  change 40 q 12 -antiviral coverage -scheduled nebs -LMW heparin for PE  CARDIOVASCULAR A:  Tachycardia - in setting of respiratory distress / bilateral infiltrates.  Chemo used can affect LV fxn but ECHO essentially wnl 1/27.   P:  -continue metoprolol  RENAL  A:  Hyponatremia Hypokalemia  P:   -repleted, follow BMP on lasix 40 q 12  GASTROINTESTINAL A:   NPO  P:   -advance PO poste xtubation -PPI  HEMATOLOGIC / ONC A:   Anemia Hx of Intermittent Neutropenia with chemo Inflammatory Breast Cancer - status post chemo last dose 1/17  P:  -monitor H/H, consider tx if less than 7 -Dr. Welton Flakes aware of status, appreciate input  INFECTIOUS A:   Diffuse Bilateral Infiltrates  P:   -empiric abx, atypical coverage ( zosyn, levaquin) - dc vanc  -antiviral coverage with valacyclovir -s/p bronch 1/30 for bacterial, viral culture >>  await cx data  ENDOCRINE A:  Hyperglycemia  P:   -SSI -Add lantus 15 units + TF coverage 6U  NEUROLOGIC A:   Pain  P:   -fent prn    I have personally obtained a history, examined the patient, evaluated laboratory and imaging results, formulated the assessment and plan and placed orders.  CRITICAL CARE: The patient is critically ill with multiple organ systems failure and requires high complexity decision making for assessment and support, frequent evaluation and titration of therapies, application of advanced monitoring technologies and extensive interpretation of multiple databases. Critical Care Time devoted to patient care services described in this note is 35 minutes.    Cyril Mourning MD. Tonny Bollman. [facility] Pager [phone] If no response call [phone]    01/02/2013, 8:30 AM

## 2013-01-02 NOTE — Progress Notes (Signed)
01/02/13 0440- Nsg note(late entry): Pt converted into SVT 170s-180s @ 0340. Pt denied any pain/discomfort. BP 99/74. MD aware through [facility] RN. SVT sustained for about 10 mins and she converted back to NSR without any intervention. Pt remained asymptomatic. EKG completed during and after.

## 2013-01-02 NOTE — Progress Notes (Signed)
ANTIBIOTIC CONSULT NOTE - FOLLOW UP  Pharmacy Consult for Vancomycin, Zosyn, Levaquin Indication: Sepsis/PNA  Allergies  Allergen Reactions  . Anesthetics, Amide Nausea And Vomiting    Projectile vomitting-Nausea- with 24hrs of dry heaves. With any anesthetics  . Bactrim (Sulfamethoxazole W-Trimethoprim) Other (See Comments)    Massive diarrhea, nausea vomiting, platelets dropped, hemoglobin dropped, admitted to hospital  . Sulfa Antibiotics Nausea And Vomiting and Other (See Comments)    Crazy feeling in head Life threatening reaction, decreased blood counts  . Codeine Nausea And Vomiting  . Codeine Phosphate Nausea And Vomiting  . Eggs Or Egg-Derived Products Hives   Patient Measurements: Height: 5' 1.5" (156.2 cm) Weight: 149 lb 4 oz (67.7 kg) IBW/kg (Calculated) : 48.95   Vital Signs: Temp: 98.2 F (36.8 C) (02/02 0800) Temp src: Oral (02/02 0800) BP: 145/88 mmHg (02/02 1000) Pulse Rate: 102  (02/02 1000) Intake/Output from previous day: 02/01 0701 - 02/02 0700 In: 1842 [I.V.:690; NG/GT:720; IV Piggyback:432] Out: 5135 [Urine:5135]  Labs:  [facility] 01/02/13 0405 01/01/13 0430 12/31/12 1532 12/31/12 0445 12/30/12 1230  WBC 12.2* 8.4 -- 5.2 --  HGB 8.4* 7.4* -- 7.1* --  PLT 377 331 -- 268 --  LABCREA -- -- -- -- <10  CREATININE 1.09 1.00 0.93 -- --   Estimated Creatinine Clearance: 44.7 ml/min (by C-G formula based on Cr of 1.09).  Basename 12/30/12 1210  VANCOTROUGH <5.0*  VANCOPEAK --  Drue Dun --  GENTTROUGH --  GENTPEAK --  GENTRANDOM --  TOBRATROUGH --  TOBRAPEAK --  TOBRARND --  AMIKACINPEAK --  AMIKACINTROU --  AMIKACIN --     Microbiology: 1/27 blood: ngtd 1/27 urine: mult sp-final 1/27 MRSA PCR: neg  1/30 pneumocystis smear - sent 1/30 viral Cx - pending 1/30 AFB/ BAL - NGTD 1/30 Fungus Cx - NGTD  Anti-infectives: 1/27 >> Levaquin x 1 1/27 >> fluconazole(MD) >> 1/29 1/27 >> Valtrex (PTA) >> 1/27 >> Vanc >> 2/1 1/27 >> Zosyn  3.375g IV Q8h EI >>  1/30 >> Levaquin 750mg  IV q24h >>  Assessment: 67 YO F with newly diagnosed inflammatory breast cancer sent from cancer center 1/27 with presumed acute PE confirmed on CT. Significat diffuse B/L infiltrates seen on CT.  Today is Day # 7 Zosyn EI and Day 4 Levaquin 750mg  IV q24h (or Day #5, if you count the x1 dose of Levaquin given 1/27)  SCr continues to slowly trend up. CrCl ~ 21ml/min. Will renally adjust Levaquin. Zosyn dose remains appropriate.  Plan:  1) Change Levaquin to 750mg  IV q48hr 2) No changes to Zosyn at this time.  Darrol Angel, PharmD Pager: [phone] 01/02/2013,10:47 AM

## 2013-01-03 ENCOUNTER — Inpatient Hospital Stay (HOSPITAL_COMMUNITY): Payer: Medicare Other

## 2013-01-03 DIAGNOSIS — R918 Other nonspecific abnormal finding of lung field: Secondary | ICD-10-CM

## 2013-01-03 DIAGNOSIS — R911 Solitary pulmonary nodule: Secondary | ICD-10-CM

## 2013-01-03 LAB — GLUCOSE, CAPILLARY
Glucose-Capillary: 167 mg/dL — ABNORMAL HIGH (ref 70–99)
Glucose-Capillary: 88 mg/dL (ref 70–99)
Glucose-Capillary: 160 mg/dL — ABNORMAL HIGH (ref 70–99)
Glucose-Capillary: 175 mg/dL — ABNORMAL HIGH (ref 70–99)

## 2013-01-03 LAB — BASIC METABOLIC PANEL
CO2: 39 mEq/L — ABNORMAL HIGH (ref 19–32)
Calcium: 8.9 mg/dL (ref 8.4–10.5)
Chloride: 95 mEq/L — ABNORMAL LOW (ref 96–112)
GFR calc Af Amer: 55 mL/min — ABNORMAL LOW (ref >90)
GFR calc non Af Amer: 47 mL/min — ABNORMAL LOW (ref >90)
Potassium: 3.5 mEq/L (ref 3.5–5.1)
Sodium: 141 mEq/L (ref 135–145)

## 2013-01-03 LAB — CBC
Platelets: 334 10*3/uL (ref 150–400)
RBC: 2.56 MIL/uL — ABNORMAL LOW (ref 3.87–5.11)
WBC: 13.9 10*3/uL — ABNORMAL HIGH (ref 4.0–10.5)

## 2013-01-03 MED ORDER — INSULIN ASPART 100 UNIT/ML ~~LOC~~ SOLN
0.0000 [IU] | Freq: Three times a day (TID) | SUBCUTANEOUS | Status: DC
  Administered 2013-01-03 (×2): 3 [IU] via SUBCUTANEOUS
  Administered 2013-01-04: 2 [IU] via SUBCUTANEOUS
  Administered 2013-01-04: 3 [IU] via SUBCUTANEOUS
  Administered 2013-01-04: 5 [IU] via SUBCUTANEOUS
  Administered 2013-01-05: 2 [IU] via SUBCUTANEOUS

## 2013-01-03 MED ORDER — ADULT MULTIVITAMIN W/MINERALS CH
1.0000 | ORAL_TABLET | Freq: Every day | ORAL | Status: DC
  Administered 2013-01-03 – 2013-01-05 (×3): 1 via ORAL
  Filled 2013-01-03 (×3): qty 1

## 2013-01-03 MED ORDER — PANTOPRAZOLE SODIUM 40 MG PO TBEC
40.0000 mg | DELAYED_RELEASE_TABLET | Freq: Every day | ORAL | Status: DC
  Administered 2013-01-03: 40 mg via ORAL
  Filled 2013-01-03 (×3): qty 1

## 2013-01-03 MED ORDER — DIPHENHYDRAMINE HCL 50 MG/ML IJ SOLN
25.0000 mg | Freq: Once | INTRAMUSCULAR | Status: AC
  Administered 2013-01-03: 25 mg via INTRAVENOUS
  Filled 2013-01-03: qty 1

## 2013-01-03 MED ORDER — FUROSEMIDE 10 MG/ML IJ SOLN
20.0000 mg | Freq: Every day | INTRAMUSCULAR | Status: DC
  Administered 2013-01-03: 20 mg via INTRAVENOUS
  Filled 2013-01-03: qty 2

## 2013-01-03 NOTE — Evaluation (Signed)
Physical Therapy Evaluation Patient Details Name: Jessica Wells MRN: [id number] DOB: 1946-06-06 Today's Date: 01/03/2013 Time: 4098-1191 PT Time Calculation (min): 17 min  PT Assessment / Plan / Recommendation Clinical Impression  Pt admitted for acute respiratory failure with CTA positive for PE (small / mod clot burden) and IVC filter was placed.  Pt would benefit from acute PT services in order to improve independence with transfers and ambulation while monitoring vitals and increasing activity tolerance to prepare for d/c.  Pt agreeable to needing 24/7 care upon d/c however uncertain of d/c plans at this time.    PT Assessment  Patient needs continued PT services    Follow Up Recommendations  Supervision/Assistance - 24 hour;SNF    Does the patient have the potential to tolerate intense rehabilitation      Barriers to Discharge        Equipment Recommendations  Other (comment) (possibly cane, TBD)    Recommendations for Other Services     Frequency Min 3X/week    Precautions / Restrictions Precautions Precautions: Fall Precaution Comments: monitor sats   Pertinent Vitals/Pain SaO2 83% on 3L O2 [place] during ambulation and increased 4L with SaO2 86-90% during ambulation with many short rest breaks for breathing. SaO2 94% on 3L O2 upon leaving room.      Mobility  Bed Mobility Bed Mobility: Not assessed Transfers Transfers: Stand to Sit;Sit to Stand Sit to Stand: 4: Min assist;With upper extremity assist;From chair/3-in-1 Stand to Sit: 4: Min assist;With upper extremity assist;To chair/3-in-1 Details for Transfer Assistance: assist to steady upon rise and control descent, verbal cues to use armrests to assist Ambulation/Gait Ambulation/Gait Assistance: 4: Min assist Ambulation Distance (Feet): 120 Feet Assistive device: 1 person hand held assist Ambulation/Gait Assistance Details: pt with slighty unsteady gait however no LOB, provided 1 HHA and discussed possible use of  cane next visit, pt declined RW, desaturates with ambulation requiring multiple short standing rest breaks for pursed lip breathing Gait Pattern: Step-through pattern;Decreased stride length    Exercises     PT Diagnosis: Difficulty walking;Generalized weakness  PT Problem List: Decreased strength;Decreased activity tolerance;Decreased mobility;Decreased balance;Decreased knowledge of use of DME;Decreased safety awareness;Cardiopulmonary status limiting activity PT Treatment Interventions: DME instruction;Gait training;Functional mobility training;Stair training;Patient/family education;Therapeutic activities;Therapeutic exercise;Balance training;Neuromuscular re-education (stairs if home)   PT Goals Acute Rehab PT Goals PT Goal Formulation: With patient Time For Goal Achievement: 01/17/13 Potential to Achieve Goals: Good Pt will go Supine/Side to Sit: with modified independence PT Goal: Supine/Side to Sit - Progress: Goal set today Pt will go Sit to Stand: with modified independence PT Goal: Sit to Stand - Progress: Goal set today Pt will go Stand to Sit: with modified independence PT Goal: Stand to Sit - Progress: Goal set today Pt will Ambulate: 51 - 150 feet;with supervision;with least restrictive assistive device PT Goal: Ambulate - Progress: Goal set today Pt will Perform Home Exercise Program: with supervision, verbal cues required/provided PT Goal: Perform Home Exercise Program - Progress: Goal set today  Visit Information  Last PT Received On: 01/03/13 Assistance Needed: +2 (lines/leads)    Subjective Data  Subjective: I can't believe how weak my legs are.   Prior Functioning  Home Living Lives With: Alone Type of Home: House Home Layout: Two level Alternate Level Stairs-Number of Steps: approx 18 (flight and then half a flight per pt) Alternate Level Stairs-Rails: Right Home Adaptive Equipment: Bedside commode/3-in-1;Walker - rolling Prior Function Level of  Independence: Independent Communication Communication: No difficulties    Cognition  Cognition Overall Cognitive Status: Appears within functional limits for tasks assessed/performed Arousal/Alertness: Awake/alert Orientation Level: Appears intact for tasks assessed Behavior During Session: [facility] for tasks performed    Extremity/Trunk Assessment Right Lower Extremity Assessment RLE ROM/Strength/Tone: Deficits RLE ROM/Strength/Tone Deficits: grossly at least 3+/5 throughout per functional observation Left Lower Extremity Assessment LLE ROM/Strength/Tone: Deficits LLE ROM/Strength/Tone Deficits: grossly at least 3+/5 throughout per functional observation   Balance    End of Session PT - End of Session Equipment Utilized During Treatment: Gait belt;Oxygen Activity Tolerance: Patient tolerated treatment well Patient left: in chair;with call bell/phone within reach  GP     [facility] E 01/03/2013, 12:24 PM  Zenovia Jarred, PT, DPT 01/03/2013 Pager: [phone]

## 2013-01-03 NOTE — Progress Notes (Signed)
PULMONARY  / CRITICAL CARE MEDICINE  Name: Jessica Wells MRN: [id number] DOB: 29-Nov-1946    ADMISSION DATE:  12/27/2012 CONSULTATION DATE:  12/30/12   REFERRING MD :  Dr. Malachi Bonds - [facility]  CHIEF COMPLAINT:  Acute Respiratory Failure  BRIEF PATIENT DESCRIPTION: 67 y/o F, former smoker, with PMH of GERD, Asthma, inflammatory breast cancer (10/13) who completed 22 weeks of Gemzar & Herceptin on 1/17 (followed by Dr. Welton Flakes).  Diagnosed with DVT on 1/24 and Rx'd with lovenox. Coag panel was positive for prothrombin gene mutation - heterozygous.   Seen in cancer center on 1/27 and was noted to be grey with decreased saturations to 70% on RA. Transferred to [facility] ER with CTA positive for PE (small / mod clot burden) and IVC filter was placed.  Significant diffuse bilateral infiltrates on CT noted.  1/30 pt with continued tachypnea, hypoxemia despite BiPAP and PCCM consulted for respiratory failure.    SIGNIFICANT EVENTS / STUDIES:  1/27 - Admit with 3 day hx worsening shortness of breath from cancer center, decreased energy.  Denies flu-like symptoms  1/27 CTA CHEST>>>positive for bilateral pulmonary embolism, right greater than left. Overall clot burden is felt to be small to moderate. No CT evidence of right-sided heart failure. Interval development of extensive bilateral heterogeneous opacities with slight upper lobe predominance. Interval development of mediastinal bilateral hilar adenopathy, again nonspecific and while possibly reactive, metastatic disease is not excluded. Interval resolution of right-sided axillary adenopathy and mass like thickening within the right retro alveolar breast.  Possible nondisplaced fracture of the sternum, possibly artifactual due to respiratory artifact.  No discrete associated aggressive osseous lesion.  1/27 ECHO>>>EF 60-65%  LINES / TUBES: OETT 1/30>>>2-2 L port-a-cath >>   CULTURES: 1/27 MRSA PCR>>>neg 1/27 BCx2>>>ng 1/27 UC>>>50k multiple morphotypes 1-30  pcp>>neg 1-30 bal>>neg ANTIBIOTICS: Levaquin 1/27>>> Zosyn 1/27>>> Vanco 1/27>>>2/1   SUBJECTIVE:  Denies cp, dyspnea, Afebrile NAD at rest  VITAL SIGNS: Temp:  [97.5 F (36.4 C)-98.4 F (36.9 C)] 98.3 F (36.8 C) (02/03 0800) Pulse Rate:  [77-117] 103  (02/03 0800) Resp:  [16-29] 26  (02/03 0800) BP: (130-169)/(73-95) 130/82 mmHg (02/03 0755) SpO2:  [91 %-100 %] 91 % (02/03 0800)  HEMODYNAMICS:    VENTILATOR SETTINGS:    INTAKE / OUTPUT: Intake/Output      02/02 0701 - 02/03 0700 02/03 0701 - 02/04 0700   P.O. 600    I.V. (mL/kg) 400 (5.9) 20 (0.3)   NG/GT 30    IV Piggyback 395.5 12.5   Total Intake(mL/kg) 1425.5 (21.1) 32.5 (0.5)   Urine (mL/kg/hr) 3575 (2.2)    Total Output 3575    Net -2149.5 +32.5        Urine Occurrence 1 x    Stool Occurrence 6 x      PHYSICAL EXAMINATION: General:  Extubated, NAD at rest Neuro:  AAOx4, follows commands HEENT:  Mm pink/dry, no JVD Cardiovascular:  s1s2 rrr, no m/r/g Lungs:  resp's shallow, decreased bs bases Abdomen:  Round/soft, bsx4 active Musculoskeletal:  No acute deformities.  LLE with swelling 1+ >R Skin:  Pale/warm/dry  LABS:  Lab 01/03/13 0425 01/02/13 0405 01/01/13 0430  HGB 8.0* 8.4* 7.4*  HCT 24.8* 25.2* 22.1*  WBC 13.9* 12.2* 8.4  PLT 334 377 331    Lab 01/03/13 0425 01/02/13 0405 01/01/13 0430 12/31/12 1532 12/31/12 0445 12/30/12 0355  NA 141 142 137 137 135 --  K 3.5 3.3* -- -- -- --  CL 95* 99 99 97 96 --  CO2 39* 36* 31 30 28  --  GLUCOSE 147* 316* 307* 267* 280* --  BUN 36* 41* 30* 19 16 --  CREATININE 1.17* 1.09 1.00 0.93 0.84 --  CALCIUM 8.9 8.9 8.6 8.6 8.4 --  MG -- -- -- -- -- 2.2  PHOS -- -- -- -- -- --    Lab 12/31/12 0459 12/30/12 1613 12/30/12 1148 12/30/12 1130 12/29/12 0031  PHART 7.390 7.378 7.293* 7.365 7.411  PCO2ART 46.2* 52.8* 59.2* 50.6* 39.6  PO2ART 57.0* 69.0* 110.0* 94.2 88.0  HCO3 27.4* 30.4* 27.8* 28.2* 24.6*  TCO2 26.2 29.3 27.1 27.0 23.3  O2SAT 90.6 96.6  98.2 98.8 97.4    Lab 12/27/12 1136  AST 39*  ALT 36*  ALKPHOS 62  BILITOT 0.7  PROT 5.9*  ALBUMIN 2.3*  INR 1.07    Lab 12/27/12 1136  INR 1.07    CXR: Dg Chest Port 1 View  01/03/2013  *RADIOLOGY REPORT*  Clinical Data: Pulmonary edema.  PORTABLE CHEST - 1 VIEW  Comparison: 01/02/2013  Findings: 0455 hours.  Lung volumes remain low.  Endotracheal tube has been removed in the interval.  NG tube has been removed.  The left-sided Port-A-Cath remains in place with catheter tip overlying the distal SVC.  Diffuse interstitial and patchy basilar airspace opacities persist without substantial change. Telemetry leads overlie the chest.  IMPRESSION: Interval extubation and NG tube removal.  No substantial change in cardiopulmonary exam.   Original Report Authenticated By: Kennith Center, M.D.    Dg Chest Port 1 View  01/02/2013  *RADIOLOGY REPORT*  Clinical Data: Pneumonia  PORTABLE CHEST - 1 VIEW  Comparison:   the previous day's study  Findings: Low position of endotracheal tube tip, less than 2 cm above carina.  Nasogastric tube and left subclavian port catheter are stable in position.  Low lung volumes with persistent moderate diffuse interstitial and alveolar edema or infiltrates.  No definite effusion.  Heart size within normal limits for technique.  IMPRESSION:  1.  Low endotracheal tube tip. 2.  Persistent bilateral edema/infiltrates.   Original Report Authenticated By: D. Andria Rhein, MD      ASSESSMENT / PLAN:  PULMONARY  A: Acute Respiratory Failure Bilateral Pulmonary Infiltrates - diffuse infiltrates on CT.  DDx includes atypical infection, viral, medication / chemo reaction, has had herpetic outbreaks during chemo as well. Both gemcitabine and herceptin are associated with pulmonary infiltrates and resp failure (rarely).  Less likely volume with essentially normal ECHO. Pulmonary Embolism - bilateral, small to moderate clot burden >> can be a cause for ARDS  P:   -Extubated 2-2   -ABX -diuresis as renal function / BP permit(2-3 note rising creatine and bun) -trial of steroids for infiltrates started 1/30 >> 125mg  q6h x 3 days,  changed 40 q 12 (2-2)(note bump in wbc) -antiviral coverage(pcr pending 2-3) -scheduled nebs -LMW heparin for PE(no coumadin due to ca/chemo)  CARDIOVASCULAR A:  Tachycardia - in setting of respiratory distress / bilateral infiltrates.  Chemo used can affect LV fxn but ECHO essentially wnl 1/27.   P:  -continue metoprolol  RENAL Lab Results  Component Value Date   CREATININE 1.17* 01/03/2013   CREATININE 1.09 01/02/2013   CREATININE 1.00 01/01/2013   CREATININE 1.1 12/27/2012   CREATININE 1.0 12/17/2012   CREATININE 0.7 12/10/2012    Lab 01/03/13 0425 01/02/13 0405 01/01/13 0430  K 3.5 3.3* 3.2*     Intake/Output Summary (Last 24 hours) at 01/03/13 0934 Last data filed at 01/03/13 0800  Gross per 24 hour  Intake   1384 ml  Output   3300 ml  Net  -1916 ml     A:  Hyponatremia Hypokalemia  P:   -repleted, follow BMP on lasix 40 q 12(2-3 change to 40 QD)  GASTROINTESTINAL A:   GIP  P:   -advance PO post extubation -PPI  HEMATOLOGIC / ONC A:   Anemia Hx of Intermittent Neutropenia with chemo Inflammatory Breast Cancer - status post chemo last dose 1/17  P:  -monitor H/H, consider tx if less than 7 -Dr. Welton Flakes aware of status, appreciate input  INFECTIOUS A:   Diffuse Bilateral Infiltrates  P:   -empiric abx, atypical coverage ( zosyn, levaquin) - dc vanc  -antiviral coverage with valacyclovir(culture pending) -s/p bronch 1/30 for bacterial, viral culture >> await cx data(neg PCP)  ENDOCRINE A:  Hyperglycemia  P:   -SSI -Added lantus 15 units + TF coverage 6U  NEUROLOGIC A:   Pain  P:   -fent prn  2-3  Change to SDU status. Questionable transfer to triad service 2-4. Wean O2 as tolerated.  Brett Canales Minor ACNP [facility] PCCM Pager [phone] till 3 pm If no answer page [phone] 01/03/2013, 9:27  AM  I have personally obtained a history, examined the patient, evaluated laboratory and imaging results, formulated the assessment and plan and placed orders.   Billy Fischer, MD ; [facility] [phone].  After 5:30 PM or weekends, call [phone]

## 2013-01-03 NOTE — Progress Notes (Signed)
eLink Physician-Brief Progress Note Patient Name: Jessica Wells DOB: [birth year] MRN: [id number]  Date of Service  01/03/2013   HPI/Events of Note  Insomnia   eICU Interventions  Benadryl 25 mg IV times one dose   Intervention Category Minor Interventions: Routine modifications to care plan (e.g. PRN medications for pain, fever)  Sherial Ebrahim 01/03/2013, 12:04 AM

## 2013-01-04 ENCOUNTER — Ambulatory Visit: Admit: 2013-01-04 | Payer: Self-pay | Admitting: General Surgery

## 2013-01-04 ENCOUNTER — Inpatient Hospital Stay (HOSPITAL_COMMUNITY): Payer: Medicare Other

## 2013-01-04 LAB — MAGNESIUM: Magnesium: 2.3 mg/dL (ref 1.5–2.5)

## 2013-01-04 LAB — BASIC METABOLIC PANEL
Calcium: 8.8 mg/dL (ref 8.4–10.5)
GFR calc Af Amer: 58 mL/min — ABNORMAL LOW (ref >90)
GFR calc non Af Amer: 50 mL/min — ABNORMAL LOW (ref >90)
Potassium: 3.3 mEq/L — ABNORMAL LOW (ref 3.5–5.1)
Sodium: 138 mEq/L (ref 135–145)

## 2013-01-04 LAB — CBC
MCH: 31.7 pg (ref 26.0–34.0)
MCHC: 32.5 g/dL (ref 30.0–36.0)
Platelets: 292 10*3/uL (ref 150–400)

## 2013-01-04 LAB — PHOSPHORUS: Phosphorus: 4.7 mg/dL — ABNORMAL HIGH (ref 2.3–4.6)

## 2013-01-04 SURGERY — MASTECTOMY, MODIFIED RADICAL
Anesthesia: General | Site: Breast | Laterality: Right

## 2013-01-04 MED ORDER — POTASSIUM CHLORIDE CRYS ER 20 MEQ PO TBCR
40.0000 meq | EXTENDED_RELEASE_TABLET | Freq: Once | ORAL | Status: DC
  Filled 2013-01-04: qty 2

## 2013-01-04 MED ORDER — POTASSIUM CHLORIDE CRYS ER 20 MEQ PO TBCR
40.0000 meq | EXTENDED_RELEASE_TABLET | Freq: Two times a day (BID) | ORAL | Status: AC
  Administered 2013-01-04 (×2): 40 meq via ORAL
  Filled 2013-01-04: qty 2

## 2013-01-04 MED ORDER — FUROSEMIDE 40 MG PO TABS
40.0000 mg | ORAL_TABLET | Freq: Once | ORAL | Status: AC
  Administered 2013-01-04: 40 mg via ORAL
  Filled 2013-01-04 (×2): qty 1

## 2013-01-04 NOTE — Progress Notes (Signed)
NUTRITION FOLLOW UP  Intervention:   1.  Change MVI to PO pill 2.  Brief nutrition education r/t diabetes management provided to pt.  Encouraged monitoring and discussed therapeutic diet with pt.  Nutrition Dx:   Inadequate oral intake, ongoing  Goal:   Pt to meet >/=90% of estimated needs  Monitor:   Food/Beverage intake, wt status  Assessment:   Pt admitted with worsening shortness of breath and bilateral PE requiring intubation for several days.  Pt initiated Oxepa.  Access removed once extubated. Diet has been advanced to CHO Modified Medium.  Pt states she has good appetite and is eating well.  Pt is concerned about her blood glucose level.  She states she is familiar with nutrition therapy r/t diabetes from caring for her mother who had difficult to manage diabetes.  She denies education needs r/t nutrition but is open to reviewing handout.  She is very interested in how to treat her diabetes.  RD provided "Nutrition Therapy for Type 2 Diabetes" handout with contact information.  Reviewed highlights of handout such as sources of carbohydrates, meal spacing, and encouraged whole grains.  Pt verbalizes understanding. Pt's wt has been stable.  Height: Ht Readings from Last 1 Encounters:  12/27/12 5' 1.5" (1.562 m)    Weight Status:   Wt Readings from Last 1 Encounters:  01/02/13 149 lb 4 oz (67.7 kg)    Re-estimated needs:  Kcal: 2956-2130 Protein: 67-80g Fluid: >1.8 L/day  Skin: moderate pitting edema  Diet Order: Carb Control   Intake/Output Summary (Last 24 hours) at 01/04/13 1543 Last data filed at 01/04/13 1000  Gross per 24 hour  Intake    446 ml  Output    900 ml  Net   -454 ml    Last BM: 2/4   Labs:   Lab 01/04/13 0500 01/03/13 0425 01/02/13 0405 12/30/12 0355  NA 138 141 142 --  K 3.3* 3.5 3.3* --  CL 95* 95* 99 --  CO2 38* 39* 36* --  BUN 33* 36* 41* --  CREATININE 1.12* 1.17* 1.09 --  CALCIUM 8.8 8.9 8.9 --  MG 2.3 -- -- 2.2  PHOS 4.7* --  -- --  GLUCOSE 216* 147* 316* --    CBG (last 3)   Basename 01/04/13 1224 01/03/13 2348 01/03/13 1934  GLUCAP 224* 187* 216*    Scheduled Meds:   . antiseptic oral rinse  15 mL Mouth Rinse QID  . chlorhexidine  15 mL Mouth Rinse BID  . enoxaparin (LOVENOX) injection  70 mg Subcutaneous Q12H  . insulin aspart  0-15 Units Subcutaneous TID WC  . metoprolol tartrate  25 mg Oral BID  . multivitamin with minerals  1 tablet Oral Daily  . potassium chloride  40 mEq Oral BID    Continuous Infusions:   Loyce Dys, MS RD LDN Clinical Inpatient Dietitian Pager: [phone] Weekend/After hours pager: [phone]

## 2013-01-04 NOTE — Progress Notes (Addendum)
Physical Therapy Treatment Patient Details Name: Jessica Wells MRN: [id number] DOB: 12-29-1945 Today's Date: 01/04/2013 Time: 6578-4696 PT Time Calculation (min): 25 min  PT Assessment / Plan / Recommendation Comments on Treatment Session  Pt progressing very well with mobility.  Assessed ambulation with SPC vs RW and feel that pt has increased balance and energy conservation with RW.  Pt in agreement.  She does have to take several standing rest breaks during ambulation.       Follow Up Recommendations  Supervision/Assistance - 24 hour;SNF, HHPT (as CSW note states pt will have 24/7 assist at home and would prefer to return home)     Does the patient have the potential to tolerate intense rehabilitation     Barriers to Discharge        Equipment Recommendations  None recommended by PT (states she has parents old walker and cane)    Recommendations for Other Services    Frequency Min 3X/week   Plan Discharge plan needs to be updated.     Precautions / Restrictions Precautions Precautions: Fall Precaution Comments: monitor sats, on 2L O2 Restrictions Weight Bearing Restrictions: No   Pertinent Vitals/Pain No pain    Mobility  Bed Mobility Bed Mobility: Not assessed Transfers Transfers: Sit to Stand;Stand to Sit Sit to Stand: 4: Min guard;With upper extremity assist;With armrests;From chair/3-in-1;From toilet;4: Min assist Stand to Sit: 4: Min guard;With upper extremity assist;With armrests;To chair/3-in-1;To toilet Details for Transfer Assistance: Min/guard for safety when sitting/standing from recliner, however some assist when rising from toilet due to lower surface.  Cues for hand placement, safety and keeping RW with her until seated.  Ambulation/Gait Ambulation/Gait Assistance: 4: Min guard;4: Min Environmental consultant (Feet): 500 Feet (took several standing rest breaks) Assistive device: Straight cane;Rolling walker Ambulation/Gait Assistance Details: Had pt  initially ambulate with SPC and noted some instability with one LOB with assist to correct/prevent fall.  Had pt transition to RW with increased stability noted and less WOB.  Ambulated on 2L O2 with O2 sats dropping to 86-87% intermittently, however with rest breaks, pt able to increase sats to 91-92%.  RN notified.  Gait Pattern: Step-through pattern;Decreased stride length Gait velocity: decreased    Exercises     PT Diagnosis:    PT Problem List:   PT Treatment Interventions:     PT Goals Acute Rehab PT Goals PT Goal Formulation: With patient Time For Goal Achievement: 01/17/13 Potential to Achieve Goals: Good Pt will go Sit to Stand: with modified independence PT Goal: Sit to Stand - Progress: Progressing toward goal Pt will go Stand to Sit: with modified independence PT Goal: Stand to Sit - Progress: Progressing toward goal Pt will Ambulate: 51 - 150 feet;with supervision;with least restrictive assistive device PT Goal: Ambulate - Progress: Progressing toward goal  Visit Information  Last PT Received On: 01/04/13 Assistance Needed: +2 (lines, O2)    Subjective Data  Subjective: I'll do whatever you think is best.  Patient Stated Goal: to get stronger   Cognition  Cognition Overall Cognitive Status: Appears within functional limits for tasks assessed/performed Arousal/Alertness: Awake/alert Orientation Level: Appears intact for tasks assessed Behavior During Session: [facility] for tasks performed    Balance  Balance Balance Assessed: Yes Static Standing Balance Static Standing - Balance Support: No upper extremity supported;During functional activity Static Standing - Level of Assistance: 5: Stand by assistance Static Standing - Comment/# of Minutes: Pt able to stand at sink with no upper extremity support at stand by assist.  End of Session PT - End of Session Equipment Utilized During Treatment: Oxygen;Gait belt Activity Tolerance: Patient tolerated treatment  well Patient left: in chair;with call bell/phone within reach Nurse Communication: Mobility status (O2 sats)   GP     Vista Deck 01/04/2013, 12:12 PM

## 2013-01-04 NOTE — Progress Notes (Signed)
PULMONARY  / CRITICAL CARE MEDICINE  Name: Jessica Wells MRN: [id number] DOB: 09-Mar-1946    ADMISSION DATE:  12/27/2012 CONSULTATION DATE:  12/30/12   REFERRING MD :  Dr. Malachi Bonds - [facility]  CHIEF COMPLAINT:  Acute Respiratory Failure  BRIEF PATIENT DESCRIPTION: 67 y/o F, former smoker, with PMH of GERD, Asthma, inflammatory breast cancer (10/13) who completed 22 weeks of Gemzar & Herceptin on 1/17 (followed by Dr. Welton Flakes).  Diagnosed with DVT on 1/24 and Rx'd with lovenox. Coag panel was positive for prothrombin gene mutation - heterozygous.   Seen in cancer center on 1/27 and was noted to be grey with decreased saturations to 70% on RA. Transferred to [facility] ER with CTA positive for PE (small / mod clot burden) and IVC filter was placed.  Significant diffuse bilateral infiltrates on CT noted.  1/30 pt with continued tachypnea, hypoxemia despite BiPAP and PCCM consulted for respiratory failure.    SIGNIFICANT EVENTS / STUDIES:  1/27 - Admit with 3 day hx worsening shortness of breath from cancer center, decreased energy.  Denies flu-like symptoms  1/27 CTA CHEST>>>positive for bilateral pulmonary embolism, right greater than left. Overall clot burden is felt to be small to moderate. No CT evidence of right-sided heart failure. Interval development of extensive bilateral heterogeneous opacities with slight upper lobe predominance. Interval development of mediastinal bilateral hilar adenopathy, again nonspecific and while possibly reactive, metastatic disease is not excluded. Interval resolution of right-sided axillary adenopathy and mass like thickening within the right retro alveolar breast.  Possible nondisplaced fracture of the sternum, possibly artifactual due to respiratory artifact.  No discrete associated aggressive osseous lesion.  1/27 ECHO>>>EF 60-65%  LINES / TUBES: OETT 1/30>>>2-2 L port-a-cath >>   CULTURES: 1/27 MRSA PCR>>>neg 1/27 BCx2>>>ng 1/27 UC>>>50k multiple morphotypes 1-30  pcp>>neg 1-30 bal>>neg  ANTIBIOTICS: Levaquin 1/27>> 2/04 Zosyn 1/27>> 2/04 Vanco 1/27>>>2/1   SUBJECTIVE:  Denies cp, dyspnea, Afebrile NAD at rest  VITAL SIGNS: Temp:  [97.5 F (36.4 C)-98.4 F (36.9 C)] 97.9 F (36.6 C) (02/04 0800) Pulse Rate:  [73-116] 79  (02/04 0400) Resp:  [17-25] 17  (02/04 0400) BP: (103-130)/(53-81) 118/63 mmHg (02/04 0400) SpO2:  [92 %-100 %] 96 % (02/04 0845)  HEMODYNAMICS:    VENTILATOR SETTINGS:    INTAKE / OUTPUT: Intake/Output      02/03 0701 - 02/04 0700 02/04 0701 - 02/05 0700   P.O.     I.V. (mL/kg) 460 (6.8)    NG/GT     IV Piggyback 137.5    Total Intake(mL/kg) 597.5 (8.8)    Urine (mL/kg/hr) 2300 (1.4)    Total Output 2300    Net -1702.5         Stool Occurrence 4 x      PHYSICAL EXAMINATION: General:  Extubated, NAD at rest Neuro:  AAOx4, follows commands HEENT:  Mm pink/dry, no JVD Cardiovascular:  s1s2 rrr, no m/r/g Lungs:  resp's shallow, decreased bs bases Abdomen:  Round/soft, bsx4 active Ext: no edema   LABS:  Lab 01/04/13 0500 01/03/13 0425 01/02/13 0405  HGB 8.2* 8.0* 8.4*  HCT 25.2* 24.8* 25.2*  WBC 10.3 13.9* 12.2*  PLT 292 334 377    Lab 01/04/13 0500 01/03/13 0425 01/02/13 0405 01/01/13 0430 12/31/12 1532 12/30/12 0355  NA 138 141 142 137 137 --  K 3.3* 3.5 -- -- -- --  CL 95* 95* 99 99 97 --  CO2 38* 39* 36* 31 30 --  GLUCOSE 216* 147* 316* 307* 267* --  BUN 33* 36* 41* 30* 19 --  CREATININE 1.12* 1.17* 1.09 1.00 0.93 --  CALCIUM 8.8 8.9 8.9 8.6 8.6 --  MG 2.3 -- -- -- -- 2.2  PHOS 4.7* -- -- -- -- --    Lab 12/31/12 0459 12/30/12 1613 12/30/12 1148 12/30/12 1130 12/29/12 0031  PHART 7.390 7.378 7.293* 7.365 7.411  PCO2ART 46.2* 52.8* 59.2* 50.6* 39.6  PO2ART 57.0* 69.0* 110.0* 94.2 88.0  HCO3 27.4* 30.4* 27.8* 28.2* 24.6*  TCO2 26.2 29.3 27.1 27.0 23.3  O2SAT 90.6 96.6 98.2 98.8 97.4   No results found for this basename: AST:5,ALT:5,ALKPHOS:5,BILITOT:5,PROT:5,ALBUMIN:5,INR:5 in  the last 168 hours No results found for this basename: INR:5 in the last 168 hours  CXR: Dg Chest Port 1 View  01/04/2013  *RADIOLOGY REPORT*  Clinical Data: No history provided.  PORTABLE CHEST - 1 VIEW  Comparison: 01/03/2013  Findings: Multifocal interstitial/airspace opacities bilaterally, mildly improved. No pleural effusion or pneumothorax.  The heart is top normal in size.  Left subclavian chest port.  IMPRESSION: Multifocal interstitial/airspace opacities bilaterally, mildly improved.   Original Report Authenticated By: Charline Bills, M.D.    Dg Chest Port 1 View  01/03/2013  *RADIOLOGY REPORT*  Clinical Data: Pulmonary edema.  PORTABLE CHEST - 1 VIEW  Comparison: 01/02/2013  Findings: 0455 hours.  Lung volumes remain low.  Endotracheal tube has been removed in the interval.  NG tube has been removed.  The left-sided Port-A-Cath remains in place with catheter tip overlying the distal SVC.  Diffuse interstitial and patchy basilar airspace opacities persist without substantial change. Telemetry leads overlie the chest.  IMPRESSION: Interval extubation and NG tube removal.  No substantial change in cardiopulmonary exam.   Original Report Authenticated By: Kennith Center, M.D.      ASSESSMENT / PLAN:  PULMONARY  A: Acute Respiratory Failure Bilateral Pulmonary Infiltrates - diffuse infiltrates on CT.  DDx includes atypical infection, viral, medication / chemo reaction, has had herpetic outbreaks during chemo as well. Both gemcitabine and herceptin are associated with pulmonary infiltrates and resp failure (rarely).  Less likely volume with essentially normal ECHO. Pulmonary Embolism - bilateral, small to moderate clot burden >> can be a cause for ARDS  P:   -Extubated 2-2  -ABX -diuresis as renal function / BP permit(2-3 note rising creatine and bun) -trial of steroids for infiltrates started 1/30 >> 125mg  q6h x 3 days,  changed 40 q 12 (2-2)(note bump in wbc) -antiviral coverage(pcr  pending 2-3) -scheduled nebs -LMW heparin for PE(no coumadin due to ca/chemo)  CARDIOVASCULAR A:  Tachycardia - in setting of respiratory distress / bilateral infiltrates.  Chemo used can affect LV fxn but ECHO essentially wnl 1/27. Marland Kitchen resolved  P:  -continue metoprolol  RENAL Lab Results  Component Value Date   CREATININE 1.12* 01/04/2013   CREATININE 1.17* 01/03/2013   CREATININE 1.09 01/02/2013   CREATININE 1.1 12/27/2012   CREATININE 1.0 12/17/2012   CREATININE 0.7 12/10/2012    Lab 01/04/13 0500 01/03/13 0425 01/02/13 0405  K 3.3* 3.5 3.3*  .   Intake/Output Summary (Last 24 hours) at 01/04/13 0911 Last data filed at 01/04/13 0600  Gross per 24 hour  Intake    545 ml  Output   2150 ml  Net  -1605 ml     A:  Hyponatremia Hypokalemia  P:   -repleted, follow BMP on lasix 40 q 12(2-4 change to 40 po QD x1 more dose 2-4 then stop)  GASTROINTESTINAL A:   GIP  P:   -  advance PO post extubation -PPI  HEMATOLOGIC / ONC  Basename 01/04/13 0500 01/03/13 0425  HGB 8.2* 8.0*    A:   Anemia Hx of Intermittent Neutropenia with chemo Inflammatory Breast Cancer - status post chemo last dose 1/17  P:  -monitor H/H, consider tx if less than 7 -Dr. Welton Flakes aware of status, appreciate input  INFECTIOUS A:   Diffuse Bilateral Infiltrates  P:   -empiric abx, atypical coverage ( zosyn, levaquin) - dc vanc  -antiviral coverage with valacyclovir(culture pending) -s/p bronch 1/30 for bacterial, viral culture >> await cx data(neg PCP)  ENDOCRINE CBG (last 3)   Basename 01/03/13 2348 01/03/13 1934 01/03/13 1549  GLUCAP 187* 216* 175*     A:  Hyperglycemia  P:   -SSI -Monitor closely for hypoglycemia as steroids are D/C'd  NEUROLOGIC A:   Pain  P:   -fent prn  2-4  Change to floor  status.  transfer to triad service 2-5. Wean O2 as tolerated.  Brett Canales Minor ACNP Ad[facility] Pollack PCCM Pager [phone] till 3 pm If no answer page [phone] 01/04/2013, 9:11  AM      PCCM Attending: I have interviewed and examined the patient and reviewed the database. I have formulated the assessment and plan as reflected in the note above with amendments made by me.   CXR much improved  In addition to above 1) D/C abx 2) D/C steroids 3) D/C Lantus 4) Cont CBGs/SSI  Transfer to Tele bed and to [facility] service. PCCM will sign off as of 2/05. Please call if we can be of further assistance.   Please address with Heme/Onc Welton Flakes) whether she should be treated long term with LMWH or warfarin noting that her INR was subtherapeutic on admission and therefore this should probably not be considered a warfarin failure  Billy Fischer, MD;  PCCM service; Mobile [phone]

## 2013-01-04 NOTE — Progress Notes (Signed)
[facility]  [phone]   DIAGNOSIS: 67 year old female with new diagnosis of inflammatory breast cancer   PRIOR THERAPY:   #1 patient was originally seen inclinic for new diagnosis of inflammatory breast cancer she was referred by Dr. Emelia Loron. She had a mammogram performed That showed an abnormality. That was biopsied and it showed an invasive mammary carcinoma with lymphovascular invasion grade 3 ER negative PR negative HER-2/neu positive.  #2 patient has gone on to have MRI of the breasts performed the MRI shows diffuse right breast neoplasm on a large level I right axillary lymph nodes compatible with lymphatic spread.Patient has had a biopsy of the right axillary lymph node that is compatible with invasive mammary carcinoma.  #3 patient began neoadjuvant FEC 100 with day 2 Neulasta on 07/23/2012  #4 Weekly neoadjuvant Taxol and Herceptin starting 09/17/12, patient developed neuropathies, and taxol was discontinued early and patient received 2 weeks of herceptin only. She completed 7 weeks of Taxol/Herceptin combination therapy.  #5 Weekly Gemcitabine and Herceptin starting on 11/19/12   CURRENT THERAPY: Herceptin/Gemcitabine cycle 5 12/17/12--therapy completed after 5 cycles   HOSPITAL PROGRESS NOTE  Events since 1/27 noted. Patient was diagnosed per CT angio on 1/27 with bilateral PE despite being on Lovenox 70 mg sq bid for Bilateral DVT as an outpatient. At the time, a Hypercoagulable panel had been drawn which was positive for prothrombin gene mutation-heterozygous. She had an IVC filter placement on 1/27 by IR without complications and continued with full dose  Lovenox per pharmacy. Status was complicated by acute hypoxic respiratory failure likely due to PE and Pneumonia on BiPap and IV antibiotics with Vanc, Zosyn and Levofloxacin.  She continues to have significant shortness of breath. In addition, she had episodes of PAT resolved with vagal  maneuvers.ECHO from 1/27 did not demonstrate right or left heart failure or diastolic dysfunction. Ef was 60-65 percent.  Her dyspnea continued to worsen and she required intubation and ventilator support.  She also underwent bronchoscopy with BAL on 1/30. She was started on steroids on 1/30 and extubated on 2/2.  Cytology on BAL consisted with adenocarcinoma.    She is now on 2L oxygen nasal cannula.  Her breathing is much improved.  She is working with physical therapy, eating, drinking, and having normal BMs and urinating well.  She has been afebrile.  I informed her today about her BAL results.        MEDICATIONS:    . antiseptic oral rinse  15 mL Mouth Rinse QID  . chlorhexidine  15 mL Mouth Rinse BID  . enoxaparin (LOVENOX) injection  70 mg Subcutaneous Q12H  . insulin aspart  0-15 Units Subcutaneous TID WC  . metoprolol tartrate  25 mg Oral BID  . multivitamin with minerals  1 tablet Oral Daily  . potassium chloride  40 mEq Oral BID    ALLERGIES:   Allergies  Allergen Reactions  . Anesthetics, Amide Nausea And Vomiting    Projectile vomitting-Nausea- with 24hrs of dry heaves. With any anesthetics  . Bactrim (Sulfamethoxazole W-Trimethoprim) Other (See Comments)    Massive diarrhea, nausea vomiting, platelets dropped, hemoglobin dropped, admitted to hospital  . Sulfa Antibiotics Nausea And Vomiting and Other (See Comments)    Crazy feeling in head Life threatening reaction, decreased blood counts  . Codeine Nausea And Vomiting  . Codeine Phosphate Nausea And Vomiting  . Eggs Or Egg-Derived Products Hives     PHYSICAL EXAMINATION:   Filed Vitals:  01/04/13 1418  BP: 114/62  Pulse: 94  Temp: 97.7 F (36.5 C)  Resp: 84     67 year old  in  Moderate respiatory discomfort, ill appearing. General well-developed and well-nourished  HEENT: Normocephalic, atraumatic, PERRLA. Intubated. Lungs clear, decreased at basis. Cardiac: regular rate and rhythm,no murmur , rubs  or gallops Abdomen soft nontender , bowel sounds x4. No HSM Extremities no clubbing cyanosis, bilateral pitting edema, L 2+, R 1 + No bruising or petechial rash Neuro: non focal  LABORATORY/RADIOLOGY DATA:   Lab 01/04/13 0500 01/03/13 0425 01/02/13 0405 01/01/13 0430 12/31/12 0445  WBC 10.3 13.9* 12.2* 8.4 5.2  HGB 8.2* 8.0* 8.4* 7.4* 7.1*  HCT 25.2* 24.8* 25.2* 22.1* 20.9*  PLT 292 334 377 331 268  MCV 97.3 96.9 95.1 94.0 93.3  MCH 31.7 31.3 31.7 31.5 31.7  MCHC 32.5 32.3 33.3 33.5 34.0  RDW 17.0* 16.7* 16.4* 16.0* 16.1*  LYMPHSABS -- -- -- -- --  MONOABS -- -- -- -- --  EOSABS -- -- -- -- --  BASOSABS -- -- -- -- --  BANDABS -- -- -- -- --    CMP    Lab 01/04/13 0500 01/03/13 0425 01/02/13 0405 01/01/13 0430 12/31/12 1532 12/30/12 0355  NA 138 141 142 137 137 --  K 3.3* 3.5 3.3* 3.2* 3.5 --  CL 95* 95* 99 99 97 --  CO2 38* 39* 36* 31 30 --  GLUCOSE 216* 147* 316* 307* 267* --  BUN 33* 36* 41* 30* 19 --  CREATININE 1.12* 1.17* 1.09 1.00 0.93 --  CALCIUM 8.8 8.9 8.9 8.6 8.6 --  MG 2.3 -- -- -- -- 2.2  AST -- -- -- -- -- --  ALT -- -- -- -- -- --  ALKPHOS -- -- -- -- -- --  BILITOT -- -- -- -- -- --        Component Value Date/Time   BILITOT 0.7 12/27/2012 1136   BILITOT 0.46 12/17/2012 1049    Anemia panel:  No results found for this basename: VITAMINB12:2,FOLATE:2,FERRITIN:2,TIBC:2,IRON:2,RETICCTPCT:2 in the last 72 hours  No results found for this basename: TSH,T4TOTAL,FREET3,T3FREE,THYROIDAB in the last 72 hours   No results found for this basename: esrsedrate    No results found for this basename: INR:5,PROTIME:5 in the last 168 hours    Urinalysis    Component Value Date/Time   COLORURINE YELLOW 12/30/2012 1230   APPEARANCEUR CLEAR 12/30/2012 1230   LABSPEC 1.007 12/30/2012 1230   PHURINE 5.5 12/30/2012 1230   GLUCOSEU NEGATIVE 12/30/2012 1230   HGBUR TRACE* 12/30/2012 1230   BILIRUBINUR NEGATIVE 12/30/2012 1230   KETONESUR NEGATIVE 12/30/2012 1230    PROTEINUR NEGATIVE 12/30/2012 1230   UROBILINOGEN 0.2 12/30/2012 1230   NITRITE NEGATIVE 12/30/2012 1230   LEUKOCYTESUR NEGATIVE 12/30/2012 1230    Drugs of Abuse  No results found for this basename: labopia,  cocainscrnur,  labbenz,  amphetmu,  thcu,  labbarb     Liver Function Tests: No results found for this basename: AST:5,ALT:5,ALKPHOS:5,BILITOT:5,PROT:5,ALBUMIN:5 in the last 168 hours No results found for this basename: LIPASE:5,AMYLASE:5 in the last 168 hours No results found for this basename: AMMONIA:5 in the last 168 hours  CBG:  Lab 01/04/13 1224 01/03/13 2348 01/03/13 1934 01/03/13 1549 01/03/13 1112  GLUCAP 224* 187* 216* 175* 160*   Hgb A1c No results found for this basename: HGBA1C:2 in the last 72 hours   Radiology Studies:   Ct Angio Chest Pe W/cm &/or Wo Cm  12/27/2012  *RADIOLOGY REPORT*  Clinical Data: Pleuritic chest pain, hypoxemia, tachycardia, history of breast cancer with new diagnosis of DVT, evaluate for pulmonary embolism  CT ANGIOGRAPHY CHEST  Technique:  Multidetector CT imaging of the chest using the standard protocol during bolus administration of intravenous contrast. Multiplanar reconstructed images including MIPs were obtained and reviewed to evaluate the vascular anatomy.  Contrast: OMNIPAQUE IOHEXOL 350 MG/ML SOLN  Comparison: CT chest, abdomen pelvis - 07/26/2012; PET CT - 07/26/2012; chest radiograph - earlier same day  Vascular Findings:  There is suboptimal opacification of the pulmonary arterial system of the main pulmonary artery measuring only 217 HU.  Given this limitation, there is are discrete filling defects within the peripheral aspect of the right main pulmonary artery (image 114, series 5) with nonocclusive thrombus extending into nearly all major segmental branches of the right lung.  There is a there is nonocclusive thrombus within the left anterior/medial basilar segmental pulmonary artery of the left lower lobe (image 122, series  five).  The overall clot burden is felt to be small to moderate.  Caliber of the main pulmonary artery is normal measuring 3 cm in diameter.  There is no definitive bowing of the interventricular septum.  There is no reflux of injected contrast into the hepatic venous system.  Normal heart size.  Trace amount of pericardial fluid, possibly physiologic.  Normal caliber thoracic aorta.  Left anterior chest wall port catheter tip terminates within the distal SVC.   Nonvascular findings:  There has been interval development of rather extensive ground- glass opacities within the bilateral lungs with slight apical predominance.  This represents a marked change from prior chest CT performed 07/26/2012.  No discrete air bronchograms.  Central pulmonary airways are patent.  No pleural effusion or pneumothorax.  Interval development of mediastinal and bilateral hilar adenopathy with index pretracheal node measuring 1.1 cm in short axis diameter (image 26, series 4, right hilar nodal the lung aeration measuring approximately 1.2 cm (image 36) and left hilar nodal conglomeration measuring approximately 0.9 cm (image 27).  Interval resolution of right-sided axillary adenopathy.  Limited early arterial phase evaluation of the upper abdomen suggests decreased attenuation of the hepatic parenchyma suggestive of hepatic steatosis.  Hiatal hernia.  Evaluation of the sternum is degraded secondary to streak artifact and patient motion, however there is a possible nondisplaced fracture involving the mid aspect of the sternum (image 78, series nine).  No discrete aggressive osseous lesions.  Decreased conspicuity of previously noted right-sided retroareolar mass.  IMPRESSION:  1.  Examination is positive for bilateral pulmonary embolism, right greater than left. Overall clot burden is felt to be small to moderate.  No CT evidence of right-sided heart failure.  2.  Interval development of extensive bilateral heterogeneous opacities with  slight upper lobe predominance.  Differential considerations are broad and include drug toxicity, though note, atypical infection may have a similar appearance. 3.  Interval development of mediastinal bilateral hilar adenopathy, again nonspecific and while possibly reactive, metastatic disease is not excluded.  4.  Interval resolution of right-sided axillary adenopathy and mass like thickening within the right retro alveolar breast.  5. Possible nondisplaced fracture of the sternum, possibly artifactual due to respiratory artifact.  Correlation for point tenderness at this location is recommended.  No discrete associated aggressive osseous lesion.  Above findings discussed with Dr. Lorenso Courier at [phone].   Original Report Authenticated By: Tacey Ruiz, MD    Ir Ivc Filter Plmt / S&i /img Guid/mod Sed  12/28/2012  *RADIOLOGY REPORT*  Clinical Data:  Lower extremity DVT, pulmonary emboli, breast cancer  ULTRASOUND GUIDANCE FOR VASCULAR ACCESS IVC CATHETERIZATION AND VENOGRAM IVC FILTER INSERTION  Date:  12/27/2012 17:40:00  Radiologist:  M. Ruel Favors, M.D.  Medications:  1 mg Versed, 50 mcg Fentanyl  Guidance:  Ultrasound fluoroscopic  Fluoroscopy time:  1.8 minutes  Sedation time:  30 minutes  Contrast Volume:  40 ml Omnipaque-300  Complications:  No immediate  PROCEDURE/FINDINGS:  Informed consent was obtained from the patient following explanation of the procedure, risks, benefits and alternatives. The patient understands, agrees and consents for the procedure. All questions were addressed.  A time out was performed.  Maximal barrier sterile technique utilized including caps, mask, sterile gowns, sterile gloves, large sterile drape, hand hygiene, and betadine prep.  Under sterile condition and local anesthesia, right internal jugular venous access was performed with ultrasound.  Over a guide wire, the IVC filter delivery sheath and inner dilator were advanced into the IVC just above the IVC bifurcation.  Contrast  injection was performed for an IVC venogram.  IVC VENOGRAM:  The IVC is patent.  No evidence of thrombus, stenosis, or occlusion.  No variant venous anatomy.  The renal veins are identified at L1-2.  IVC FILTER INSERTION:  Through the delivery sheath, the Cordis Celect IVC filter was deployed in the infrarenal IVC at the L3 level just below the renal veins and above the IVC bifurcation. Contrast injection confirmed position.  There is good apposition of the filter against the IVC.  The delivery sheath was removed and hemostasis was obtained with compression for 5 minutes.  The patient tolerated the procedure well.  No immediate complications.  IMPRESSION: Ultrasound and fluoroscopically guided infrarenal IVC filter insertion.   Original Report Authenticated By: Judie Petit. Miles Costain, M.D.   Dg Chest Port 1 View  12/30/2012  *RADIOLOGY REPORT*  Clinical Data: Evaluate endotracheal tube  PORTABLE CHEST - 1 VIEW  Comparison: 12/30/2012  Findings: An endotracheal tube is in place and the tip is located 1.7 cm above the level of the carina.  An orogastric tube is in place and the tip is located below the level of the hemidiaphragm but not included on this exam.  A left subclavian CVP is stable in location.  Heart and mediastinal contours are unchanged.  Bilateral diffuse alveolar infiltrates are seen with a perihilar distribution and some increase in central peribronchial cuffing. There may be a small left pleural effusion and suggestion of some interstitial septal lines is noted at the right base.  The appearance is suspicious for pulmonary edema with a noncardiogenic etiology suspected given the normal heart size. Multifocal pneumonia could have a similar appearance and clinical correlation is needed.  IMPRESSION: Lines and tubes as above with endotracheal tube position as noted. Little interval change in the bilateral alveolar infiltrative pattern suspicious for noncardiogenic pulmonary edema with a diffuse infectious process  not excluded in the appropriate clinical setting   Original Report Authenticated By: Rhodia Albright, M.D.    Dg Chest Port 1 View  12/30/2012  *RADIOLOGY REPORT*  Clinical Data: Evaluate for pulmonary edema.  PORTABLE CHEST - 1 VIEW  Comparison: Chest x-ray 12/27/2012.  Findings: Left subclavian single Port-A-Cath with tip terminating in the superior cavoatrial junction.  Increasing diffuse interstitial and patchy airspace disease, most confluent in the right upper lobe.  No definite pleural effusions.  Pulmonary vasculature appears engorged, but is largely obscured.  Heart size is upper limits of normal.  Mediastinal contours are largely obscured.  IMPRESSION:  1.  Significantly worsened aeration related to increasing interstitial and patchy air space disease throughout the lungs bilaterally.  This is relatively symmetric, suggesting some degree of pulmonary edema, however, the more confluent opacity in the right upper lobe could be concerning for pneumonia.  Clinical correlation is recommended.   Original Report Authenticated By: Trudie Reed, M.D.        ASSESSMENT AND PLAN:   #1 inflammatory right breast carcinoma with positive lymph nodes the tumor is ER negative PR negative HER-2/neu positive. Patient has completed neoadjuvant chemotherapy.  Dr. Dwain Sarna to reschedule mastectomy for after patient recovers.  Plans for radiation (Dr. Michell Heinrich) are delayed as well.  2. Bilateral DVT and PE , Positive Prothrombin Gene Mutation, heterozygous. S/p  IVC filter 12/27/12. Lovenox Full dose.  3.HCAP/ Acute Hypoxic Respiratory failure: BAL from bronch showing adenocarcinoma, plan on PET/CT and further workup after patient is discharged from hospital.  4. Fever/pneumonia, on Zosyn, and Levaquin.  Afebrile.   5. Code status: FULL CODE.  Cherie Ouch Lyn Hollingshead, NP Medical Oncology [facility] Phone: [phone] Augustin Schooling 01/04/2013, 3:03 PM

## 2013-01-04 NOTE — Clinical Social Work Psychosocial (Signed)
Clinical Social Work Department BRIEF PSYCHOSOCIAL ASSESSMENT 01/04/2013  Patient:  Jessica Wells, Jessica Wells     Account Number:  [id number]     Admit date:  12/27/2012  Clinical Social Worker:  Jodelle Red  Date/Time:  01/04/2013 10:00 AM  Referred by:  CSW  Date Referred:  01/04/2013 Referred for  Other - See comment   Other Referral:   POSSIBLE NEED FOR SNF   Interview type:  Patient Other interview type:   CHART REVIEW, DISCUSSION WITH RN    PSYCHOSOCIAL DATA Living Status:  ALONE Admitted from facility:   Level of care:   Primary support name:  Lennox Grumbles Primary support relationship to patient:  CHILD, ADULT Degree of support available:   good from extended family. Son lives next door and can stay with Pt. Sister of Pt can stay with her as well.    CURRENT CONCERNS Current Concerns  None Noted   Other Concerns:    SOCIAL WORK ASSESSMENT / PLAN CSW reviewed chart as PT had mentioned remote possibilty of need for SNF. Pt did well this am per RN, getting to potty. Pt would like to return home with [facility] and family assistance. CSW provided list of SNFs, but Pt declines bed search currently. No other needs noted. Pt in good spirits and very eager to get home.   Assessment/plan status:  Information/Referral to [facility] Other assessment/ plan:   Information/referral to community resources:    PATIENT'S/FAMILY'S RESPONSE TO PLAN OF CARE: Pt very eager to get home, doing well emotionally. She has good support from family who will stay with her 24/7 at discharge. RN CM working on [employer]. No other CSW needs noted. CSW signing off.    Doreen Salvage, LCSW ICU/Stepdown Clinical Social Worker [facility] Cell [phone] Hours 8am-1200pm M-F

## 2013-01-05 LAB — BASIC METABOLIC PANEL
BUN: 27 mg/dL — ABNORMAL HIGH (ref 6–23)
CO2: 35 mEq/L — ABNORMAL HIGH (ref 19–32)
GFR calc non Af Amer: 51 mL/min — ABNORMAL LOW (ref >90)
Glucose, Bld: 93 mg/dL (ref 70–99)
Potassium: 3.8 mEq/L (ref 3.5–5.1)

## 2013-01-05 LAB — GLUCOSE, CAPILLARY: Glucose-Capillary: 94 mg/dL (ref 70–99)

## 2013-01-05 MED ORDER — ENOXAPARIN (LOVENOX) PATIENT EDUCATION KIT
PACK | Freq: Once | Status: DC
  Filled 2013-01-05: qty 1

## 2013-01-05 MED ORDER — HEPARIN SOD (PORK) LOCK FLUSH 100 UNIT/ML IV SOLN
500.0000 [IU] | INTRAVENOUS | Status: AC | PRN
  Administered 2013-01-05: 500 [IU]

## 2013-01-05 NOTE — Discharge Summary (Signed)
Physician Discharge Summary  Patient ID: Jessica Wells MRN: [id number] DOB/AGE: [birth year] 67 y.o.  Admit date: 12/27/2012 Discharge date: 01/05/2013  Problem List Principal Problem:  *Acute respiratory failure with hypoxia Active Problems:  Breast cancer  Clotting disorder  DVT (deep venous thrombosis)  Healthcare-associated pneumonia  Acute respiratory distress  Pulmonary embolism  Hypokalemia  Pulmonary infiltrates  HPI: 67 y/o F, former smoker, with PMH of GERD, Asthma, inflammatory breast cancer (10/13) who completed 22 weeks of Gemzar & Herceptin on 1/17 (followed by Dr. Welton Flakes). Diagnosed with DVT on 1/24 and Rx'd with lovenox. Coag panel was positive for prothrombin gene mutation - heterozygous. Seen in cancer center on 1/27 and was noted to be grey with decreased saturations to 70% on RA. Transferred to [facility] ER with CTA positive for PE (small / mod clot burden) and IVC filter was placed. Significant diffuse bilateral infiltrates on CT noted.  1/30 pt with continued tachypnea, hypoxemia despite BiPAP and PCCM consulted for respiratory failure.     Hospital Course:  SIGNIFICANT EVENTS / STUDIES:  1/27 - Admit with 3 day hx worsening shortness of breath from cancer center, decreased energy. Denies flu-like symptoms  1/27 CTA CHEST>>>positive for bilateral pulmonary embolism, right greater than left. Overall clot burden is felt to be small to moderate. No CT evidence of right-sided heart failure. Interval development of extensive bilateral heterogeneous opacities with slight upper lobe predominance. Interval development of mediastinal bilateral hilar adenopathy, again nonspecific and while possibly reactive, metastatic disease is not excluded. Interval resolution of right-sided axillary adenopathy and mass like thickening within the right retro alveolar breast. Possible nondisplaced fracture of the sternum, possibly artifactual due to respiratory artifact. No discrete associated aggressive  osseous lesion.  1/27 ECHO>>>EF 60-65%   LINES / TUBES:  OETT 1/30>>>2-2  L port-a-cath >>  CULTURES:  1/27 MRSA PCR>>>neg  1/27 BCx2>>>ng  1/27 UC>>>50k multiple morphotypes  1-30 pcp>>neg  1-30 bal>>neg  ANTIBIOTICS:  Levaquin 1/27>> 2/04  Zosyn 1/27>> 2/04  Vanco 1/27>>>2/1 ASSESSMENT / PLAN:  PULMONARY  A:  Acute Respiratory Failure  Bilateral Pulmonary Infiltrates - diffuse infiltrates on CT. DDx includes atypical infection, viral, medication / chemo reaction, has had herpetic outbreaks during chemo as well. Both gemcitabine and herceptin are associated with pulmonary infiltrates and resp failure (rarely). Less likely volume with essentially normal ECHO.  Pulmonary Embolism - bilateral, small to moderate clot burden >> can be a cause for ARDS but unusual x in antiphospholipid syndrome P:  -Extubated 2-2  -ABX  -diuresis as renal function / BP permit(2-3 note rising creatine and bun)  -trial of steroids for infiltrates started 1/30 >> 125mg  q6h x 3 days, changed 40 q 12 (2-2)(note bump in wbc)  -antiviral coverage(pcr pending 2-3)  -scheduled nebs  -LMW heparin for PE(no coumadin due to ca/chemo) resume home dose. -Home O2 at 2 l/m. CARDIOVASCULAR  A:  Tachycardia - in setting of respiratory distress / bilateral infiltrates. Chemo used can affect LV fxn but ECHO essentially wnl 1/27. Marland Kitchen resolved  P:  -continue metoprolol  RENAL  Lab Results   Component  Value  Date    CREATININE  1.12*  01/04/2013    CREATININE  1.17*  01/03/2013    CREATININE  1.09  01/02/2013    CREATININE  1.1  12/27/2012    CREATININE  1.0  12/17/2012    CREATININE  0.7  12/10/2012     Lab  01/04/13 0500  01/03/13 0425  01/02/13 0405   K  3.3*  3.5  3.3*   .   Intake/Output Summary (Last 24 hours) at 01/04/13 0911 Last data filed at 01/04/13 0600   Gross per 24 hour   Intake  545 ml   Output  2150 ml   Net  -1605 ml    A:  Hyponatremia  Hypokalemia  P:  -repleted, follow BMP on lasix 40 q  12(2-4 change to 40 po QD x1 more dose 2-4 then stop)     HEMATOLOGIC / ONC   Basename  01/04/13 0500  01/03/13 0425   HGB  8.2*  8.0*    A:  Anemia  Hx of Intermittent Neutropenia with chemo  Inflammatory Breast Cancer - status post chemo last dose 1/17  P:  -monitor H/H, consider tx if less than 7  -Dr. Welton Flakes aware of status, appreciate input  INFECTIOUS  A:  Diffuse Bilateral Infiltrates  P:  -empiric abx, atypical coverage ( zosyn, levaquin) - dc vanc  -antiviral coverage with valacyclovir(culture pending)  -s/p bronch 1/30 for bacterial, viral culture >> await cx data(neg PCP)  ENDOCRINE  CBG (last 3)   Basename  01/03/13 2348  01/03/13 1934  01/03/13 1549   GLUCAP  187*  216*  175*    A:  Hyperglycemia  P:  -SSI  -Monitor closely for hypoglycemia as steroids are D/C'd  NEUROLOGIC  A:  Pain  P:  -fent prn  2-4 Changed to floor status.   Wean O2 as tolerated.  d  In addition to above  1) D/C abx  2) D/C steroids  3) D/C Lantus  4) Cont CBGs/SSI    Labs at discharge Lab Results  Component Value Date   CREATININE 1.09 01/05/2013   BUN 27* 01/05/2013   NA 138 01/05/2013   K 3.8 01/05/2013   CL 98 01/05/2013   CO2 35* 01/05/2013   Lab Results  Component Value Date   WBC 10.3 01/04/2013   HGB 8.2* 01/04/2013   HCT 25.2* 01/04/2013   MCV 97.3 01/04/2013   PLT 292 01/04/2013   Lab Results  Component Value Date   ALT 36* 12/27/2012   AST 39* 12/27/2012   ALKPHOS 62 12/27/2012   BILITOT 0.7 12/27/2012   Lab Results  Component Value Date   INR 1.07 12/27/2012   INR 1.06 09/15/2012    Current radiology studies Dg Chest Port 1 View  01/04/2013  *RADIOLOGY REPORT*  Clinical Data: No history provided.  PORTABLE CHEST - 1 VIEW  Comparison: 01/03/2013  Findings: Multifocal interstitial/airspace opacities bilaterally, mildly improved. No pleural effusion or pneumothorax.  The heart is top normal in size.  Left subclavian chest port.  IMPRESSION: Multifocal interstitial/airspace  opacities bilaterally, mildly improved.   Original Report Authenticated By: Charline Bills, M.D.     Disposition:  01-Home or Self Care  Discharge Orders    Future Appointments: Provider: Department: Dept Phone: Center:   01/13/2013 3:45 PM Nyoka Cowden, MD [facility] [phone] None   01/14/2013 2:45 PM [facility] MEDICAL ONCOLOGY [phone] None   01/14/2013 3:15 PM Augustin Schooling, NP [facility] MEDICAL ONCOLOGY [phone] None   01/14/2013 4:15 PM [facility] [phone] None   01/20/2013 10:00 AM [facility] ECHO LAB [phone] None   01/20/2013 11:00 AM [facility] [phone] None   02/04/2013 8:30 AM Delcie Roch [facility]  MEDICAL ONCOLOGY [phone] None   02/04/2013 8:45 AM Augustin Schooling, NP [facility] MEDICAL ONCOLOGY [phone] None   02/04/2013 9:45 AM [facility] MEDICAL ONCOL[facility]Y [phone] None   02/25/2013 9:15 AM Krista Blue [facility] MEDICAL ONCOLOGY [phone] None   02/25/2013 9:45 AM Augustin Schooling, NP [facility] MEDICAL ONCOLOGY [phone] None   02/25/2013 10:45 AM [facility] MED[facility] ONCOL[facility]Y [phone] None   03/18/2013 10:15 AM Krista Blue [facility] MEDICAL ONCOLOGY [phone] None   03/18/2013 10:45 AM Augustin Schooling, NP [facility] MEDICAL ONCOLOGY [phone] None   03/18/2013 11:45 AM [facility] MEDICAL [facility] [phone] None     Future Orders Please Complete By Expires   Discharge patient          Medication List     As of 01/05/2013  1:43 PM    STOP taking these medications         fluconazole 200 MG tablet   Commonly known as:  DIFLUCAN      TAKE these medications         albuterol 108 (90 BASE) MCG/ACT inhaler   Commonly known as: PROVENTIL HFA;VENTOLIN HFA   Inhale 2 puffs into the lungs every 6 (six) hours as needed.      b complex vitamins tablet   Take 1 tablet by mouth daily.      calcium-vitamin D 500-200 MG-UNIT per tablet   Commonly known as: OSCAL WITH D   Take 1 tablet by mouth daily.      enoxaparin 80 MG/0.8ML injection   Commonly known as: LOVENOX   Inject 0.7 mLs (70 mg total) into the skin every 12 (twelve) hours.      fish oil-omega-3 fatty acids 1000 MG capsule   Take 2 g by mouth daily.      FLAX SEEDS PO   Take 1 tablet by mouth daily.      fluticasone 50 MCG/ACT nasal spray   Commonly known as: FLONASE   Place 1 spray into the nose daily as needed. For allergies      glucosamine-chondroitin 500-400 MG tablet   Take 1 tablet by mouth daily.      lidocaine-prilocaine cream   Commonly known as: EMLA   Apply topically as needed.      magic mouthwash w/lidocaine Soln   Take 5 mLs by mouth 4 (four) times daily as needed.      metoprolol succinate 50 MG 24 hr tablet   Commonly known as: TOPROL-XL   Take 1 tablet (50 mg total) by mouth daily. Take with or immediately following a meal.      multivitamin with minerals Tabs   Take 1 tablet by mouth daily.      omeprazole 20 MG capsule   Commonly known as: PRILOSEC   Take 20 mg by mouth daily as needed. For heartburn      ondansetron 8 MG tablet   Commonly known as: ZOFRAN   Take 8 mg by mouth every 8 (eight) hours as needed.      oxyCODONE-acetaminophen 5-325 MG per tablet   Commonly known as: PERCOCET/ROXICET   Take 1 tablet by mouth every 4 (four) hours as needed for pain.      valACYclovir 500 MG tablet   Commonly known as: VALTREX   Take 1 tablet (500 mg total) by mouth 3 (three) times daily.  Follow-up Information    Follow up with [facility], MD. In 1 week.   Contact information:   [address] [place] [address] [phone]       Follow up with Sandrea Hughs, MD. On 01/13/2013. (3:45 pm)    Contact information:   [address] [place] [address] [phone]       Follow up with [name], MD. (soon)    Contact information:   [address] [place] [address] [phone]       Follow up with Drue Second, MD. (Dr. Milta Deiters office will call you and schedule an appointment.)    Contact information:   [address] [place] [address] [phone]          Discharged Condition: {good Vital signs at Discharge. Temp:  [97.7 F (36.5 C)-98.7 F (37.1 C)] 98.7 F (37.1 C) (02/05 0457) Pulse Rate:  [87-94] 87  (02/05 0457) Resp:  [18] 18  (02/05 0457) BP: (114-128)/(62-72) 128/72 mmHg (02/05 0457) SpO2:  [95 %-99 %] 99 % (02/05 0457) 02  2lpm Office follow up Special Information or instructions. You will be on home oxygen and Dr. Sherene Sires will follow you.   Brett Canales Minor ACNP [facility] PCCM Pager [phone] till 3 pm If no answer page [phone] 01/05/2013, 1:43 PM  Pt seen and examined and hosp course, discharge summary reviewed and agree with plans as outlined   Sandrea Hughs, MD

## 2013-01-05 NOTE — Progress Notes (Signed)
SATURATION QUALIFICATIONS: (This note is used to comply with regulatory documentation for home oxygen)  Patient Saturations on Room Air at Rest = 92%  Patient Saturations on Room Air while Ambulating = 81%  Patient Saturations on 2 Liters of oxygen while Ambulating = 94%  Please briefly explain why patient needs home oxygen: desats with ambulation/activity

## 2013-01-05 NOTE — Care Management Note (Signed)
    Page 1 of 2   01/05/2013     5:01:33 PM   CARE MANAGEMENT NOTE 01/05/2013  Patient:  Jessica Wells, Jessica Wells   Account Number:  [id number]  Date Initiated:  12/28/2012  Documentation initiated by:  DAVIS,RHONDA  Subjective/Objective Assessment:   pt with confirnmed bil pe had insertion of ivc     Action/Plan:   from home   Anticipated DC Date:  01/05/2013   Anticipated DC Plan:  HOME W HOME HEALTH SERVICES  In-house referral  NA         [facility] Choice  NA   Choice offered to / List presented to:  C-1 Patient   DME arranged  OXYGEN      DME agency  [facility]     HH arranged  HH-2 PT  HH-7 RESPIRATORY THERAPY      HH agency  [facility]   Status of service:  Completed, signed off Medicare Important Message given?  NA - LOS <3 / Initial given by admissions (If response is "NO", the following Medicare IM given date fields will be blank) Date Medicare IM given:   Date Additional Medicare IM given:    Discharge Disposition:  HOME W HOME HEALTH SERVICES  Per UR Regulation:  Reviewed for med. necessity/level of care/duration of stay  If discussed at Long Length of Stay Meetings, dates discussed:    Comments:  01/05/13 [facility] RN,BSN NCM [phone] [facility] CHOSEN SUSAN LIASON AWARE OF HHPT,& [facility] DME REP LUCRETIA AWARE OF OXYGEN ORDERS W/QUALIFYING 02 SATS.02 BROUGHT TO RM. [id number] Earlene Plater, RN, BSN, CCM:  CHART REVIEWED AND UPDATED.  Next chart review due on [id number]. NO DISCHARGE NEEDS PRESENT AT THIS TIME.   Spoke to patient concerning hhc needs if needed she would like to use [facility] if needed.  Kristen with [facility] notified. CASE MANAGEMENT [phone]   [id number] Earlene Plater, RN, BSN, CCM:  CHART REVIEWED AND UPDATED.  Next chart review due on [id number]. NO DISCHARGE NEEDS PRESENT AT THIS TIME.  Patient required resp support via intubation on [id number]. CASE MANAGEMENT [phone]   [id number] Earlene Plater, RN, BSN, CCM:  CHART  REVIEWED AND UPDATED.  Next chart review due on [id number]. NO DISCHARGE NEEDS PRESENT AT THIS TIME. CASE MANAGEMENT [phone]

## 2013-01-05 NOTE — Progress Notes (Signed)
Porta cath deaccessed. Flushed with 10cc NS followed by Heparin 5ml (100u/ml). No bleeding to site, band aid to site for comfort. Jessica Wells M 

## 2013-01-05 NOTE — Progress Notes (Signed)
Physical Therapy Treatment Patient Details Name: Jessica Wells MRN: [id number] DOB: [birth year] Today's Date: 01/05/2013 Time: 4098-1191 PT Time Calculation (min): 25 min  PT Assessment / Plan / Recommendation Comments on Treatment Session  Pt progressing very well with mobility.  Amb on RA sats dropped to 85% so reapllied 2 lts nasal.  Pt plans to D/C to home today with spouse.    Follow Up Recommendations  Home health PT     Does the patient have the potential to tolerate intense rehabilitation     Barriers to Discharge        Equipment Recommendations  None recommended by PT    Recommendations for Other Services    Frequency Min 3X/week   Plan Discharge plan remains appropriate    Precautions / Restrictions Precautions Precautions: Fall Precaution Comments: monitor sats, on 2L O2  Restrictions Weight Bearing Restrictions: No   Pertinent Vitals/Pain No c/o pain    Mobility  Bed Mobility Bed Mobility: Supine to Sit Supine to Sit: 6: Modified independent (Device/Increase time) Transfers Transfers: Sit to Stand;Stand to Sit Sit to Stand: 5: Supervision;From bed Stand to Sit: 5: Supervision;To chair/3-in-1 Details for Transfer Assistance: increased time and use of hands to steady self Ambulation/Gait Ambulation/Gait Assistance: 5: Supervision;4: Min guard Ambulation Distance (Feet): 245 Feet Assistive device: None Ambulation/Gait Assistance Details: Amb w/o AD or holding to IV pole.  Pt demon steady alternating gait with no LOB or c/o. Amb on RA sats dropped to 85% so reapplied 2 lts nasal.  Gait Pattern: Step-through pattern;Decreased stride length Gait velocity: decreased    PT Goals                                                progressing    Visit Information  Last PT Received On: 01/05/13 Assistance Needed: +1    Subjective Data  Subjective: I'm  going home today Patient Stated Goal: get stronger   Cognition    good   Balance   good  End of Session PT  - End of Session Equipment Utilized During Treatment: Gait belt Activity Tolerance: Patient tolerated treatment well Patient left: in chair;with call bell/phone within reach Nurse Communication: Mobility status   Felecia Shelling  PTA [facility]  Acute  Rehab Pager      [phone]

## 2013-01-06 ENCOUNTER — Telehealth (INDEPENDENT_AMBULATORY_CARE_PROVIDER_SITE_OTHER): Payer: Self-pay

## 2013-01-06 ENCOUNTER — Other Ambulatory Visit: Payer: Self-pay | Admitting: Medical Oncology

## 2013-01-06 DIAGNOSIS — C50919 Malignant neoplasm of unspecified site of unspecified female breast: Secondary | ICD-10-CM

## 2013-01-06 NOTE — Telephone Encounter (Signed)
Called pt to check on her after being discharged from the hospital. The pt has an appt with Dr Dwain Sarna on 01/10/13 arrive at 2:15.

## 2013-01-07 ENCOUNTER — Telehealth: Payer: Self-pay | Admitting: Oncology

## 2013-01-07 ENCOUNTER — Other Ambulatory Visit: Payer: Self-pay | Admitting: Medical Oncology

## 2013-01-07 ENCOUNTER — Telehealth: Payer: Self-pay | Admitting: Medical Oncology

## 2013-01-07 ENCOUNTER — Ambulatory Visit (HOSPITAL_BASED_OUTPATIENT_CLINIC_OR_DEPARTMENT_OTHER): Payer: Medicare Other | Admitting: Family

## 2013-01-07 ENCOUNTER — Encounter: Payer: Self-pay | Admitting: Family

## 2013-01-07 VITALS — BP 124/75 | HR 118 | Temp 98.4°F | Resp 20 | Ht 61.5 in | Wt 142.8 lb

## 2013-01-07 DIAGNOSIS — Z86711 Personal history of pulmonary embolism: Secondary | ICD-10-CM | POA: Diagnosis not present

## 2013-01-07 DIAGNOSIS — Z86718 Personal history of other venous thrombosis and embolism: Secondary | ICD-10-CM

## 2013-01-07 DIAGNOSIS — R609 Edema, unspecified: Secondary | ICD-10-CM | POA: Diagnosis not present

## 2013-01-07 DIAGNOSIS — C50919 Malignant neoplasm of unspecified site of unspecified female breast: Secondary | ICD-10-CM

## 2013-01-07 MED ORDER — POTASSIUM CHLORIDE CRYS ER 10 MEQ PO TBCR: 10.0000 meq | EXTENDED_RELEASE_TABLET | ORAL | Status: DC | PRN

## 2013-01-07 MED ORDER — FUROSEMIDE 20 MG PO TABS: 20.0000 mg | ORAL_TABLET | ORAL | Status: DC | PRN

## 2013-01-07 NOTE — Patient Instructions (Addendum)
Please contact us at [phone] if you have any questions or concerns.  Take 1 Lasix pill tonight.  Then, weigh yourself daily in the morning after you void and if you gain 5 or more pounds take 1 Lasix pill.   Take 1 potassium pill tonight.  Then, if you take a Lasix pill, take 1 potassium pill also. Compression stockings, thigh high, wear as much as possible. Elevate your legs as much as possible. Avoid salt.

## 2013-01-07 NOTE — Progress Notes (Signed)
[facility]  [phone] Fax:([phone]  OFFICE PROGRESS NOTE  PATIENT: Jessica Wells   DOB: 07-30-1946  MR#: [id number]  JYN#:[id number]  QM:VHQION,GEXBMWUXL STEWART, MD Lurline Hare, MD Emelia Loron, MD  DIAGNOSIS:  67 year-old [place] female with new diagnosis of inflammatory breast cancer.   PRIOR THERAPY: 1.  The patient was originally seen in clinic for new diagnosis of inflammatory breast cancer she was referred by Jessica Wells. She had a mammogram performed on 07/14/2012 that showed an abnormality. That was biopsied and it showed an invasive mammary carcinoma with lymphovascular invasion grade 3 ER negative PR negative HER-2/neu positive.   2.  The patient had a MRI of the breasts performed on 07/19/2012.  The MRI shows diffuse right breast neoplasm on a large level I right axillary lymph nodes compatible with lymphatic spread.Patient has had a biopsy of the right axillary lymph node that is compatible with invasive mammary carcinoma.   3.  The patient began neoadjuvant FEC 100 with day 2 Neulasta support on 07/23/2012.   4.   Weekly neoadjuvant Taxol and Herceptin starting 09/17/12.  The patient developed neuropathies, and Taxol was discontinued early.  The patient received 2 weeks of Herceptin only. She completed 7 weeks of Taxol/Herceptin combination therapy.   5. Weekly Gemcitabine and Herceptin was started on 11/19/12 and completed on 12/17/2012 after 5 cycles.   CURRENT THERAPY:  Herceptin Q weekly x 12 weeks planned to start on 01/14/2013.   INTERVAL HISTORY: Jessica Wells and I saw Jessica Wells today for followup after her hospitalization from 12/27/2012 through 01/05/2013. The patient is accompanied by her son Jessica Wells for today's office visit.  The patient was hospitalized for acute respiratory failure with hypoxia. The patient was intubated during her hospitalization.  The patient also had bilateral pulmonary emboli and bilateral DVTs  for which an IVC filter was placed on 12/27/2012 by Interventional Radiology without complications. She continues on Lovenox subcutaneously 70 mg twice a day.  The patient has complaints of bilateral lower extremity swelling during today's office visit.  She states that she was given Lasix and potassium while in the hospital, but was discharged without these medications. Her bilateral lower extremities have subsequently swelled. The patient has ongoing complaints of neuropathy in her feet. The patient presents today with portable oxygen via nasal cannula at 2 L per minute. The patient also states that she will be receiving physical therapy at her home for deconditioning. The patient denies any other symptomatology today.  PAST MEDICAL HISTORY: Past Medical History  Diagnosis Date  . Asthma   . GERD (gastroesophageal reflux disease)   . Dysrhythmia     PAT-sees dr Jessica Wells meds  . Breast cancer 07/14/12    inflammatory right breast ca, ER/PR -  . Allergy   . PAT (paroxysmal atrial tachycardia)     hx  . Arthritis     osteopenia,knees  . PONV (postoperative nausea and vomiting) 09-13-12    severe, with Port-a-cath, was managed without PONV  . Clotting disorder     prothrombin gene mutation-heterozygous   . History of chemotherapy     Last dose 12/17/12.  Was rx'd with Herceptin & Gemzar  . DVT (deep venous thrombosis)     PAST SURGICAL HISTORY: Past Surgical History  Procedure Date  . Dilation and curettage of uterus     x3  . Tonsillectomy   . Colonoscopy   . Portacath placement 07/21/2012    Procedure: INSERTION PORT-A-CATH;  Surgeon: Emelia Loron,  MD;  Location: [facility];  Service: General;  Laterality: Left;  . Insertion of vena cava filter 12/2012    FAMILY HISTORY: Family History  Problem Relation Age of Onset  . Diabetes Mother   . Heart disease Mother   . Hypertension Mother   . Hyperlipidemia Mother   . Breast cancer Mother 69    lobular breast  cancer  . Heart disease Maternal Aunt   . Heart disease Maternal Uncle   . Heart disease Maternal Grandmother     died in her 41s from heart disease  . Lung cancer Father 56    adenocarcinoma  . Cancer Paternal Uncle     dx in mid 54s with a cancer in the leg, died late 74s; grandpaternal half uncle    SOCIAL HISTORY: History  Substance Use Topics  . Smoking status: Former Smoker -- 1.0 packs/day for 35 years    Types: Cigarettes    Quit date: 07/21/1979  . Smokeless tobacco: Never Used  . Alcohol Use: No    ALLERGIES: Allergies  Allergen Reactions  . Anesthetics, Amide Nausea And Vomiting    Projectile vomitting-Nausea- with 24hrs of dry heaves. With any anesthetics  . Bactrim (Sulfamethoxazole W-Trimethoprim) Other (See Comments)    Massive diarrhea, nausea vomiting, platelets dropped, hemoglobin dropped, admitted to hospital  . Sulfa Antibiotics Nausea And Vomiting and Other (See Comments)    Crazy feeling in head Life threatening reaction, decreased blood counts  . Codeine Nausea And Vomiting  . Codeine Phosphate Nausea And Vomiting  . Eggs Or Egg-Derived Products Hives     MEDICATIONS:  Current Outpatient Prescriptions  Medication Sig Dispense Refill  . albuterol (PROVENTIL HFA;VENTOLIN HFA) 108 (90 BASE) MCG/ACT inhaler Inhale 2 puffs into the lungs every 6 (six) hours as needed.      . Alum & Mag Hydroxide-Simeth (MAGIC MOUTHWASH W/LIDOCAINE) SOLN Take 5 mLs by mouth 4 (four) times daily as needed.  60 mL  0  . b complex vitamins tablet Take 1 tablet by mouth daily.      . calcium-vitamin D (OSCAL WITH D) 500-200 MG-UNIT per tablet Take 1 tablet by mouth daily.      Marland Kitchen enoxaparin (LOVENOX) 80 MG/0.8ML injection Inject 0.7 mLs (70 mg total) into the skin every 12 (twelve) hours.  60 Syringe  3  . fish oil-omega-3 fatty acids 1000 MG capsule Take 2 g by mouth daily.      . Flaxseed, Linseed, (FLAX SEEDS PO) Take 1 tablet by mouth daily.      . fluticasone (FLONASE)  50 MCG/ACT nasal spray Place 1 spray into the nose daily as needed. For allergies      . glucosamine-chondroitin 500-400 MG tablet Take 1 tablet by mouth daily.      Marland Kitchen lidocaine-prilocaine (EMLA) cream Apply topically as needed.  30 g  5  . metoprolol succinate (TOPROL XL) 50 MG 24 hr tablet Take 1 tablet (50 mg total) by mouth daily. Take with or immediately following a meal.  30 tablet  3  . Multiple Vitamin (MULTIVITAMIN WITH MINERALS) TABS Take 1 tablet by mouth daily.      Marland Kitchen omeprazole (PRILOSEC) 20 MG capsule Take 20 mg by mouth daily as needed. For heartburn      . ondansetron (ZOFRAN) 8 MG tablet Take 8 mg by mouth every 8 (eight) hours as needed.       Marland Kitchen oxyCODONE-acetaminophen (ROXICET) 5-325 MG per tablet Take 1 tablet by mouth every 4 (  four) hours as needed for pain.  15 tablet  0  . valACYclovir (VALTREX) 500 MG tablet Take 1 tablet (500 mg total) by mouth 3 (three) times daily.  90 tablet  6  . furosemide (LASIX) 20 MG tablet Take 1 tablet (20 mg total) by mouth as needed (If you gain more than 5 pounds in one day, take one pill. ).  30 tablet  1  . potassium chloride SA (K-DUR,KLOR-CON) 10 MEQ tablet Take 1 tablet (10 mEq total) by mouth as needed (If you take Lasix, take the potassium pill).  30 tablet  1      REVIEW OF SYSTEMS: A 10 point review of systems was completed and is negative except as noted above.    PHYSICAL EXAMINATION: BP 124/75  Pulse 118  Temp 98.4 F (36.9 C) (Oral)  Resp 20  Ht 5' 1.5" (1.562 m)  Wt 142 lb 12.8 oz (64.774 kg)  BMI 26.55 kg/m2   O2 sats 97% on 2 LPM via Guys.  General appearance: Alert, cooperative, well nourished, no apparent distress, patient is in a wheelchair Head: The patient is wearing a hat Eyes: Conjunctivae/corneas clear, PERRLA, EOMI Nose: No drainage or sinus tenderness Neck: No adenopathy, supple, symmetrical, trachea midline, thyroid not enlarged, no tenderness Resp: Clear to auscultation bilaterally, diminished bibasilar  breath sounds Cardio: Regular rate and rhythm, S1, S2 normal, no murmur, click, rub or gallop, left chest Port-A-Cath Breasts: Not examined GI: Soft, distended, non-tender, hypoactive bowel sounds, no organomegaly from a seated position Extremities: Extremities normal, atraumatic, no cyanosis,  bilateral LE pitting edema, LLE (+3)  is larger than RLE (+2) Lymph nodes: Cervical, supraclavicular, and axillary nodes normal Neurologic: Grossly normal    ECOG FS:  Grade 3 - Symptomatic with limited ambulation  LAB RESULTS: Lab Results  Component Value Date   WBC 10.3 01/04/2013   NEUTROABS 5.7 12/27/2012   HGB 8.2* 01/04/2013   HCT 25.2* 01/04/2013   MCV 97.3 01/04/2013   PLT 292 01/04/2013      Chemistry      Component Value Date/Time   NA 138 01/05/2013 0540   NA 126* 12/27/2012 1048   K 3.8 01/05/2013 0540   K 4.0 12/27/2012 1048   CL 98 01/05/2013 0540   CL 92* 12/27/2012 1048   CO2 35* 01/05/2013 0540   CO2 24 12/27/2012 1048   BUN 27* 01/05/2013 0540   BUN 14.7 12/27/2012 1048   CREATININE 1.09 01/05/2013 0540   CREATININE 1.1 12/27/2012 1048      Component Value Date/Time   CALCIUM 8.7 01/05/2013 0540   CALCIUM 8.7 12/27/2012 1048   ALKPHOS 62 12/27/2012 1136   ALKPHOS 64 12/17/2012 1049   AST 39* 12/27/2012 1136   AST 28 12/17/2012 1049   ALT 36* 12/27/2012 1136   ALT 54 12/17/2012 1049   BILITOT 0.7 12/27/2012 1136   BILITOT 0.46 12/17/2012 1049       Lab Results  Component Value Date   LABCA2 16 07/19/2012    RADIOGRAPHIC STUDIES: Dg Chest 2 View 12/27/2012  *RADIOLOGY REPORT*  Clinical Data: Fever with shortness of breath.  History of breast cancer.  CHEST - 2 VIEW  Comparison: 09/15/2012.  Findings: Left subclavian Port-A-Cath tip is unchanged at the SVC right atrial junction.  The heart size and mediastinal contours are stable.  There are new nodular air space opacities in both lungs, right greater than left.  There is no significant pleural effusion. The osseous structures appear  unremarkable.  IMPRESSION: New bilateral air space opacities, right greater than left.  In the setting, these likely represent pneumonia.  Radiographic followup is necessary to exclude metastatic disease.   Original Report Authenticated By: Carey Bullocks, M.D.    Ct Angio Chest Pe W/cm &/or Wo Cm 12/27/2012  *RADIOLOGY REPORT*  Clinical Data: Pleuritic chest pain, hypoxemia, tachycardia, history of breast cancer with new diagnosis of DVT, evaluate for pulmonary embolism  CT ANGIOGRAPHY CHEST  Technique:  Multidetector CT imaging of the chest using the standard protocol during bolus administration of intravenous contrast. Multiplanar reconstructed images including MIPs were obtained and reviewed to evaluate the vascular anatomy.  Contrast: OMNIPAQUE IOHEXOL 350 MG/ML SOLN  Comparison: CT chest, abdomen pelvis - 07/26/2012; PET CT - 07/26/2012; chest radiograph - earlier same day  Vascular Findings:  There is suboptimal opacification of the pulmonary arterial system of the main pulmonary artery measuring only 217 HU.  Given this limitation, there is are discrete filling defects within the peripheral aspect of the right main pulmonary artery (image 114, series 5) with nonocclusive thrombus extending into nearly all major segmental branches of the right lung.  There is a there is nonocclusive thrombus within the left anterior/medial basilar segmental pulmonary artery of the left lower lobe (image 122, series five).  The overall clot burden is felt to be small to moderate.  Caliber of the main pulmonary artery is normal measuring 3 cm in diameter.  There is no definitive bowing of the interventricular septum.  There is no reflux of injected contrast into the hepatic venous system.  Normal heart size.  Trace amount of pericardial fluid, possibly physiologic.  Normal caliber thoracic aorta.  Left anterior chest wall port catheter tip terminates within the distal SVC.   -----------------------------------------------------------  Nonvascular findings:  There has been interval development of rather extensive ground- glass opacities within the bilateral lungs with slight apical predominance.  This represents a marked change from prior chest CT performed 07/26/2012.  No discrete air bronchograms.  Central pulmonary airways are patent.  No pleural effusion or pneumothorax.  Interval development of mediastinal and bilateral hilar adenopathy with index pretracheal node measuring 1.1 cm in short axis diameter (image 26, series 4, right hilar nodal the lung aeration measuring approximately 1.2 cm (image 36) and left hilar nodal conglomeration measuring approximately 0.9 cm (image 27).  Interval resolution of right-sided axillary adenopathy.  Limited early arterial phase evaluation of the upper abdomen suggests decreased attenuation of the hepatic parenchyma suggestive of hepatic steatosis.  Hiatal hernia.  Evaluation of the sternum is degraded secondary to streak artifact and patient motion, however there is a possible nondisplaced fracture involving the mid aspect of the sternum (image 78, series nine).  No discrete aggressive osseous lesions.  Decreased conspicuity of previously noted right-sided retroareolar mass.  IMPRESSION:  1.  Examination is positive for bilateral pulmonary embolism, right greater than left. Overall clot burden is felt to be small to moderate.  No CT evidence of right-sided heart failure.  2.  Interval development of extensive bilateral heterogeneous opacities with slight upper lobe predominance.  Differential considerations are broad and include drug toxicity, though note, atypical infection may have a similar appearance. 3.  Interval development of mediastinal bilateral hilar adenopathy, again nonspecific and while possibly reactive, metastatic disease is not excluded.  4.  Interval resolution of right-sided axillary adenopathy and mass like thickening within the  right retro alveolar breast.  5. Possible nondisplaced fracture of the sternum, possibly artifactual due to respiratory artifact.  Correlation for point tenderness at this location is recommended.  No discrete associated aggressive osseous lesion.  Above findings discussed with Dr. Lorenso Courier at [phone].   Original Report Authenticated By: Tacey Ruiz, MD    Ir Ivc Filter Plmt / S&i /img Guid/mod Sed 12/28/2012  *RADIOLOGY REPORT*  Clinical Data:  Lower extremity DVT, pulmonary emboli, breast cancer  ULTRASOUND GUIDANCE FOR VASCULAR ACCESS IVC CATHETERIZATION AND VENOGRAM IVC FILTER INSERTION  Date:  12/27/2012 17:40:00  Radiologist:  M. Ruel Favors, M.D.  Medications:  1 mg Versed, 50 mcg Fentanyl  Guidance:  Ultrasound fluoroscopic  Fluoroscopy time:  1.8 minutes  Sedation time:  30 minutes  Contrast Volume:  40 ml Omnipaque-300  Complications:  No immediate  PROCEDURE/FINDINGS:  Informed consent was obtained from the patient following explanation of the procedure, risks, benefits and alternatives. The patient understands, agrees and consents for the procedure. All questions were addressed.  A time out was performed.  Maximal barrier sterile technique utilized including caps, mask, sterile gowns, sterile gloves, large sterile drape, hand hygiene, and betadine prep.  Under sterile condition and local anesthesia, right internal jugular venous access was performed with ultrasound.  Over a guide wire, the IVC filter delivery sheath and inner dilator were advanced into the IVC just above the IVC bifurcation.  Contrast injection was performed for an IVC venogram.  IVC VENOGRAM:  The IVC is patent.  No evidence of thrombus, stenosis, or occlusion.  No variant venous anatomy.  The renal veins are identified at L1-2.  IVC FILTER INSERTION:  Through the delivery sheath, the Cordis Celect IVC filter was deployed in the infrarenal IVC at the L3 level just below the renal veins and above the IVC bifurcation. Contrast injection  confirmed position.  There is good apposition of the filter against the IVC.  The delivery sheath was removed and hemostasis was obtained with compression for 5 minutes.  The patient tolerated the procedure well.  No immediate complications.  IMPRESSION: Ultrasound and fluoroscopically guided infrarenal IVC filter insertion.   Original Report Authenticated By: Judie Petit. Miles Costain, M.D.    Dg Chest Port 1 View 01/04/2013  *RADIOLOGY REPORT*  Clinical Data: No history provided.  PORTABLE CHEST - 1 VIEW  Comparison: 01/03/2013  Findings: Multifocal interstitial/airspace opacities bilaterally, mildly improved. No pleural effusion or pneumothorax.  The heart is top normal in size.  Left subclavian chest port.  IMPRESSION: Multifocal interstitial/airspace opacities bilaterally, mildly improved.   Original Report Authenticated By: Charline Bills, M.D.    Dg Chest Port 1 View 01/03/2013  *RADIOLOGY REPORT*  Clinical Data: Pulmonary edema.  PORTABLE CHEST - 1 VIEW  Comparison: 01/02/2013  Findings: 0455 hours.  Lung volumes remain low.  Endotracheal tube has been removed in the interval.  NG tube has been removed.  The left-sided Port-A-Cath remains in place with catheter tip overlying the distal SVC.  Diffuse interstitial and patchy basilar airspace opacities persist without substantial change. Telemetry leads overlie the chest.  IMPRESSION: Interval extubation and NG tube removal.  No substantial change in cardiopulmonary exam.   Original Report Authenticated By: Kennith Center, M.D.    Dg Chest Port 1 View 01/02/2013  *RADIOLOGY REPORT*  Clinical Data: Pneumonia  PORTABLE CHEST - 1 VIEW  Comparison:   the previous day's study  Findings: Low position of endotracheal tube tip, less than 2 cm above carina.  Nasogastric tube and left subclavian port catheter are stable in position.  Low lung volumes with persistent moderate diffuse interstitial and alveolar edema or  infiltrates.  No definite effusion.  Heart size within normal limits  for technique.  IMPRESSION:  1.  Low endotracheal tube tip. 2.  Persistent bilateral edema/infiltrates.   Original Report Authenticated By: D. Andria Rhein, MD    Dg Chest Port 1 View 01/01/2013  *RADIOLOGY REPORT*  Clinical Data: Evaluate endotracheal tube  PORTABLE CHEST - 1 VIEW  Comparison: Prior chest x-ray 12/31/2012  Findings: Endotracheal tube is 2.2 cm above the carina.  Unchanged visualized portion of the nasogastric tube.  Left subclavian portacatheter in stable position with the tip projecting over the superior cavoatrial junction.  Slightly improved aeration of the lungs with decreasing interstitial and airspace opacities particularly in the left mid lung.  No pneumothorax, or new pleural effusion.  IMPRESSION:  1.  The endotracheal tube is 2.2 cm above the carina. 2.  Slightly improved aeration may reflect decreasing pulmonary edema   Original Report Authenticated By: Malachy Moan, M.D.    Dg Chest Port 1 View 12/31/2012  *RADIOLOGY REPORT*  Clinical Data: Mechanical ventilation, respiratory failure  PORTABLE CHEST - 1 VIEW  Comparison: 12/30/2012  Findings: Low endotracheal tube approximate 14 mm above the carina. Other support apparatus stable.  Extensive bilateral diffuse airspace process persist, very minimal improvement over 24 hours. No enlarging effusion or pneumothorax.  Cardiac borders and hemidiaphragms are slightly better visualized.  IMPRESSION: Low endotracheal tube 14 mm above the carina.  This could be retracted 2 cm.  Minimal improvement in diffuse airspace process versus alveolar edema or ARDS.   Original Report Authenticated By: Judie Petit. Miles Costain, M.D.    Dg Chest Port 1 View 12/30/2012  *RADIOLOGY REPORT*  Clinical Data: Evaluate endotracheal tube  PORTABLE CHEST - 1 VIEW  Comparison: 12/30/2012  Findings: An endotracheal tube is in place and the tip is located 1.7 cm above the level of the carina.  An orogastric tube is in place and the tip is located below the level of the  hemidiaphragm but not included on this exam.  A left subclavian CVP is stable in location.  Heart and mediastinal contours are unchanged.  Bilateral diffuse alveolar infiltrates are seen with a perihilar distribution and some increase in central peribronchial cuffing. There may be a small left pleural effusion and suggestion of some interstitial septal lines is noted at the right base.  The appearance is suspicious for pulmonary edema with a noncardiogenic etiology suspected given the normal heart size. Multifocal pneumonia could have a similar appearance and clinical correlation is needed.  IMPRESSION: Lines and tubes as above with endotracheal tube position as noted. Little interval change in the bilateral alveolar infiltrative pattern suspicious for noncardiogenic pulmonary edema with a diffuse infectious process not excluded in the appropriate clinical setting   Original Report Authenticated By: Rhodia Albright, M.D.    Dg Chest Port 1 View 12/30/2012  *RADIOLOGY REPORT*  Clinical Data: Evaluate for pulmonary edema.  PORTABLE CHEST - 1 VIEW  Comparison: Chest x-ray 12/27/2012.  Findings: Left subclavian single Port-A-Cath with tip terminating in the superior cavoatrial junction.  Increasing diffuse interstitial and patchy airspace disease, most confluent in the right upper lobe.  No definite pleural effusions.  Pulmonary vasculature appears engorged, but is largely obscured.  Heart size is upper limits of normal.  Mediastinal contours are largely obscured.  IMPRESSION: 1.  Significantly worsened aeration related to increasing interstitial and patchy air space disease throughout the lungs bilaterally.  This is relatively symmetric, suggesting some degree of pulmonary edema, however, the more confluent opacity in the  right upper lobe could be concerning for pneumonia.  Clinical correlation is recommended.   Original Report Authenticated By: Trudie Reed, M.D.     ASSESSMENT: 67 y.o. woman with: 1.   Inflammatory right breast cancer with positive lymph nodes the tumor is ER negative PR negative HER-2/neu positive.   2. Tachycardia   3.  Bilateral DVT and Bilateral PE  4.  Bilateral lower extremity edema  PLAN: 1. Patient is s/p neoadjuvant chemotherapy consisting of FEC q 2 weeks x 4 cycles.  Status post weekly Gemcitabine and Herceptin that was started on 11/19/12 and completed on 12/17/2012 after 5 cycles.   Herceptin Qweek x 12 weeks is planned to start on 01/14/2013.  Dr. Dwain Sarna to reschedule mastectomy for after patient recovers. Plans for radiation therapy with Dr. Michell Heinrich have been delayed also.  2. Continue Toprol XL  3. Bilateral DVT and PE.  The patient has a positive Prothrombin Gene Mutation, heterozygous. S/p IVC filter inserted on  12/27/2012. The patient to continue full dose Lovenox.  4.  The patient was given a prescription for Lasix 20 mg PO and Kdur 10 mEq.  Her instructions for these medications are to take one of each tonight.  Then she is to weigh herself daily at home in the morning after her first void.  If she has gained 5 or more pounds she is to take a Lasix pill and potassium pill.  The patient was asked to purchase and wear thigh-high compressing stockings.  The patient was also asked to elevate her bilateral extremities and to avoid sodium.  5.  We plan to see Ms. Nand again on 01/10/2013 for followup with laboratories of CMP, CBC and LMWH to be drawn at that time.   All questions were answered.  The patient was encouraged to contact us in the interim with any problems, questions or concerns.    Larina Bras, NP-C 01/07/2013, 10:24 PM

## 2013-01-07 NOTE — Telephone Encounter (Signed)
Patient called c/o swelling is returning in BLE, concerned that it may get worse over weekend, states she is home and "doing great and her lungs are great." Per MD, pt to see Norina Buzzard NP, patient informed. Onc treatment sent. No further questions at this time.

## 2013-01-08 DIAGNOSIS — M159 Polyosteoarthritis, unspecified: Secondary | ICD-10-CM | POA: Diagnosis not present

## 2013-01-08 DIAGNOSIS — C50919 Malignant neoplasm of unspecified site of unspecified female breast: Secondary | ICD-10-CM | POA: Diagnosis not present

## 2013-01-08 DIAGNOSIS — Z8701 Personal history of pneumonia (recurrent): Secondary | ICD-10-CM | POA: Diagnosis not present

## 2013-01-08 DIAGNOSIS — I82409 Acute embolism and thrombosis of unspecified deep veins of unspecified lower extremity: Secondary | ICD-10-CM | POA: Diagnosis not present

## 2013-01-08 DIAGNOSIS — J45909 Unspecified asthma, uncomplicated: Secondary | ICD-10-CM | POA: Diagnosis not present

## 2013-01-08 DIAGNOSIS — IMO0001 Reserved for inherently not codable concepts without codable children: Secondary | ICD-10-CM | POA: Diagnosis not present

## 2013-01-10 ENCOUNTER — Encounter (INDEPENDENT_AMBULATORY_CARE_PROVIDER_SITE_OTHER): Payer: Self-pay | Admitting: General Surgery

## 2013-01-10 ENCOUNTER — Encounter (HOSPITAL_COMMUNITY)
Admission: RE | Admit: 2013-01-10 | Discharge: 2013-01-10 | Disposition: A | Discharge status: Home / Self Care | Payer: Medicare Other | Source: Ambulatory Visit | Attending: Oncology | Admitting: Oncology

## 2013-01-10 ENCOUNTER — Ambulatory Visit (HOSPITAL_BASED_OUTPATIENT_CLINIC_OR_DEPARTMENT_OTHER): Payer: Medicare Other | Admitting: Family

## 2013-01-10 ENCOUNTER — Ambulatory Visit (HOSPITAL_BASED_OUTPATIENT_CLINIC_OR_DEPARTMENT_OTHER): Payer: Medicare Other | Admitting: Lab

## 2013-01-10 ENCOUNTER — Encounter: Payer: Self-pay | Admitting: Family

## 2013-01-10 ENCOUNTER — Ambulatory Visit (INDEPENDENT_AMBULATORY_CARE_PROVIDER_SITE_OTHER): Payer: Medicare Other | Admitting: General Surgery

## 2013-01-10 VITALS — BP 122/72 | HR 110 | Temp 98.4°F | Resp 18 | Ht 61.0 in | Wt 143.2 lb

## 2013-01-10 VITALS — BP 136/67 | HR 118 | Temp 98.4°F | Resp 20 | Ht 61.0 in | Wt 143.2 lb

## 2013-01-10 DIAGNOSIS — Z86718 Personal history of other venous thrombosis and embolism: Secondary | ICD-10-CM

## 2013-01-10 DIAGNOSIS — C50919 Malignant neoplasm of unspecified site of unspecified female breast: Secondary | ICD-10-CM

## 2013-01-10 DIAGNOSIS — D649 Anemia, unspecified: Secondary | ICD-10-CM | POA: Diagnosis not present

## 2013-01-10 DIAGNOSIS — R609 Edema, unspecified: Secondary | ICD-10-CM

## 2013-01-10 LAB — VIRAL CULTURE VIRC

## 2013-01-10 LAB — COMPREHENSIVE METABOLIC PANEL (CC13)
BUN: 11.6 mg/dL (ref 7.0–26.0)
CO2: 29 mEq/L (ref 22–29)
Calcium: 9.1 mg/dL (ref 8.4–10.4)
Chloride: 102 mEq/L (ref 98–107)
Creatinine: 0.9 mg/dL (ref 0.6–1.1)
Total Bilirubin: 0.3 mg/dL (ref 0.20–1.20)

## 2013-01-10 LAB — CBC WITH DIFFERENTIAL/PLATELET
BASO%: 0.3 % (ref 0.0–2.0)
Basophils Absolute: 0 10*3/uL (ref 0.0–0.1)
Eosinophils Absolute: 0.1 10*3/uL (ref 0.0–0.5)
HCT: 25.8 % — ABNORMAL LOW (ref 34.8–46.6)
HGB: 8.3 g/dL — ABNORMAL LOW (ref 11.6–15.9)
LYMPH%: 8.1 % — ABNORMAL LOW (ref 14.0–49.7)
MCHC: 32.2 g/dL (ref 31.5–36.0)
MONO#: 0.7 10*3/uL (ref 0.1–0.9)
NEUT%: 84.6 % — ABNORMAL HIGH (ref 38.4–76.8)
Platelets: 294 10*3/uL (ref 145–400)
WBC: 11 10*3/uL — ABNORMAL HIGH (ref 3.9–10.3)

## 2013-01-10 LAB — TECHNOLOGIST REVIEW

## 2013-01-10 NOTE — Progress Notes (Signed)
Subjective:     Patient ID: Jessica Wells, female   DOB: [birth year], 67 y.o.   MRN: [id number]  HPI 40 yof Who presented initially with a right breast inflammatory cancer. She since has undergone chemotherapy. I saw her while she was recently in the hospital for a pneumonia as well as deep venous thrombosis. She is now out of the hospital doing much better. Her heart rate still remains a little bit up. Her shortness of breath is getting better. Just looking at her she really looks almost like her normal self right now. She comes back in today to discuss scheduling of her surgery again.  Review of Systems     Objective:   Physical Exam  Vitals reviewed. Constitutional: She appears well-developed and well-nourished.  Cardiovascular: Regular rhythm.  Tachycardia present.   Pulmonary/Chest: Effort normal and breath sounds normal. She has no wheezes. She has no rales. Right breast exhibits no inverted nipple, no mass, no nipple discharge, no skin change and no tenderness. Left breast exhibits no inverted nipple, no mass, no nipple discharge, no skin change and no tenderness. Breasts are symmetrical.  Lymphadenopathy:    She has no cervical adenopathy.    She has no axillary adenopathy.       Right: No supraclavicular adenopathy present.       Assessment:     RIght inflammatory breast cancer     Plan:     We discussed a right modified radical mastectomy again. This is due to her initial diagnosis of inflammatory cancer. I think we will plan on scheduling this for about 3 weeks from now. I will have to discuss with Dr. Park Breed management of her anti-coagulation. She is also going to see Dr. Gala Romney before that which I think is a good idea as well. I will plan on seeing her in 2 weeks just to make sure she is progressing as we think.

## 2013-01-10 NOTE — Progress Notes (Signed)
[facility]  Telephone:([phone] Fax:([phone]) [phone]  OFFICE PROGRESS NOTE   PATIENT: Jessica Wells   DOB: Feb 24, 1946  MR#: [id number]  WUX#:[id number]   OZ:[id number] STEWART, MD Lurline Hare, MD Emelia Loron, MD Arvilla Meres, MD   DIAGNOSIS:  67 year-old [place], [place] female with new diagnosis of inflammatory breast cancer.   PRIOR THERAPY: 1.  The patient was originally seen in clinic for new diagnosis of inflammatory breast cancer, she was referred by Dr. Emelia Loron. She had a mammogram performed on 07/14/2012 that showed an abnormality. That was biopsied and it showed an invasive mammary carcinoma with lymphovascular invasion grade 3 ER negative PR negative HER-2/neu positive.   2.  The patient had a MRI of the breasts performed on 07/19/2012.  The MRI showed diffuse right breast neoplasm on a large level I right axillary lymph nodes compatible with lymphatic spread.  The patient has had a biopsy of the right axillary lymph node that is compatible with invasive mammary carcinoma.   3.  The patient began neoadjuvant FEC 100 with day 2 Neulasta support on 07/23/2012.   4.   Weekly neoadjuvant Taxol and Herceptin starting 09/17/12.  The patient developed neuropathies, and Taxol was discontinued early.  She completed 7 weeks of Taxol/Herceptin combination therapy.  The patient received 2 weeks of Herceptin only.   5. Weekly Gemcitabine and Herceptin was started on 11/19/12 and completed on 12/17/2012 after 5 cycles.   CURRENT THERAPY:  Herceptin Q weekly x 12 weeks planned to start on 01/14/2013.   INTERVAL HISTORY: Dr. Welton Flakes and I saw Ms. Shelvin today for follow up after her hospitalization from 12/27/2012 through 01/05/2013 and acute bilateral LE edema for which we saw her on 01/07/2013 for. The patient is accompanied by her son Brynda Greathouse for today's office visit.  Ms. Domingos's bilateral lower extremity swelling has improved since we last saw her 3  days ago, but is still present.  She stated she had to take one Lasix tablet and Potassium Chloride tablet over the weekend (her instructions were to take those medications if she gained 5 or more pounds after weighing herself daily).  The patient starts home PT tomorrow and is scheduled to have PT twice weekly for 3 weeks for deconditioning.  Ms. Blazejewski does not have portable oxygen with her today and states that she is feeling better.  The patient has had intermittent (minor) epistaxis since starting chemotherapy.  The patient denies any other symptomatology today.  Ms. Olmo had an office visit with Dr. Dwain Sarna today and she states that they discussed surgery and her tachycardia.   PAST MEDICAL HISTORY: Past Medical History  Diagnosis Date  . Asthma   . GERD (gastroesophageal reflux disease)   . Dysrhythmia     PAT-sees dr Laurence Compton meds  . Breast cancer 07/14/12    inflammatory right breast ca, ER/PR -  . Allergy   . PAT (paroxysmal atrial tachycardia)     hx  . Arthritis     osteopenia,knees  . PONV (postoperative nausea and vomiting) 09-13-12    severe, with Port-a-cath, was managed without PONV  . Clotting disorder     prothrombin gene mutation-heterozygous   . History of chemotherapy     Last dose 12/17/12.  Was rx'd with Herceptin & Gemzar  . DVT (deep venous thrombosis)     PAST SURGICAL HISTORY: Past Surgical History  Procedure Laterality Date  . Dilation and curettage of uterus      x3  . Tonsillectomy    .  Colonoscopy    . Portacath placement  07/21/2012    Procedure: INSERTION PORT-A-CATH;  Surgeon: Emelia Loron, MD;  Location: [facility];  Service: General;  Laterality: Left;  . Insertion of vena cava filter  12/2012    FAMILY HISTORY: Family History  Problem Relation Age of Onset  . Diabetes Mother   . Heart disease Mother   . Hypertension Mother   . Hyperlipidemia Mother   . Breast cancer Mother 84    lobular breast cancer  . Heart  disease Maternal Aunt   . Heart disease Maternal Uncle   . Heart disease Maternal Grandmother     died in her 55s from heart disease  . Lung cancer Father 76    adenocarcinoma  . Cancer Paternal Uncle     dx in mid 39s with a cancer in the leg, died late 6s; grandpaternal half uncle    SOCIAL HISTORY: History  Substance Use Topics  . Smoking status: Former Smoker -- 1.00 packs/day for 35 years    Types: Cigarettes    Quit date: 07/21/1979  . Smokeless tobacco: Never Used  . Alcohol Use: No    ALLERGIES: Allergies  Allergen Reactions  . Anesthetics, Amide Nausea And Vomiting    Projectile vomitting-Nausea- with 24hrs of dry heaves. With any anesthetics  . Bactrim (Sulfamethoxazole W-Trimethoprim) Other (See Comments)    Massive diarrhea, nausea vomiting, platelets dropped, hemoglobin dropped, admitted to hospital  . Sulfa Antibiotics Nausea And Vomiting and Other (See Comments)    Crazy feeling in head Life threatening reaction, decreased blood counts  . Codeine Nausea And Vomiting  . Codeine Phosphate Nausea And Vomiting  . Eggs Or Egg-Derived Products Hives     MEDICATIONS:  Current Outpatient Prescriptions  Medication Sig Dispense Refill  . albuterol (PROVENTIL HFA;VENTOLIN HFA) 108 (90 BASE) MCG/ACT inhaler Inhale 2 puffs into the lungs every 6 (six) hours as needed.      . Alum & Mag Hydroxide-Simeth (MAGIC MOUTHWASH W/LIDOCAINE) SOLN Take 5 mLs by mouth 4 (four) times daily as needed.  60 mL  0  . b complex vitamins tablet Take 1 tablet by mouth daily.      . calcium-vitamin D (OSCAL WITH D) 500-200 MG-UNIT per tablet Take 1 tablet by mouth daily.      Marland Kitchen enoxaparin (LOVENOX) 80 MG/0.8ML injection Inject 0.7 mLs (70 mg total) into the skin every 12 (twelve) hours.  60 Syringe  3  . fish oil-omega-3 fatty acids 1000 MG capsule Take 2 g by mouth daily.      . Flaxseed, Linseed, (FLAX SEEDS PO) Take 1 tablet by mouth daily.      . fluticasone (FLONASE) 50 MCG/ACT nasal  spray Place 1 spray into the nose daily as needed. For allergies      . furosemide (LASIX) 20 MG tablet Take 1 tablet (20 mg total) by mouth as needed (If you gain more than 5 pounds in one day, take one pill. ).  30 tablet  1  . glucosamine-chondroitin 500-400 MG tablet Take 1 tablet by mouth daily.      Marland Kitchen lidocaine-prilocaine (EMLA) cream Apply topically as needed.  30 g  5  . metoprolol succinate (TOPROL XL) 50 MG 24 hr tablet Take 1 tablet (50 mg total) by mouth daily. Take with or immediately following a meal.  30 tablet  3  . Multiple Vitamin (MULTIVITAMIN WITH MINERALS) TABS Take 1 tablet by mouth daily.      Marland Kitchen omeprazole (  PRILOSEC) 20 MG capsule Take 20 mg by mouth daily as needed. For heartburn      . ondansetron (ZOFRAN) 8 MG tablet Take 8 mg by mouth every 8 (eight) hours as needed.       . potassium chloride SA (K-DUR,KLOR-CON) 10 MEQ tablet Take 1 tablet (10 mEq total) by mouth as needed (If you take Lasix, take the potassium pill).  30 tablet  1  . valACYclovir (VALTREX) 500 MG tablet Take 1 tablet (500 mg total) by mouth 3 (three) times daily.  90 tablet  6   No current facility-administered medications for this visit.      REVIEW OF SYSTEMS: A 10 point review of systems was completed and is negative except as noted above.    PHYSICAL EXAMINATION: BP 136/67  Pulse 118  Temp(Src) 98.4 F (36.9 C) (Oral)  Resp 20  Ht 5\' 1"  (1.549 m)  Wt 143 lb 3.2 oz (64.955 kg)  BMI 27.07 kg/m2   O2 sats 95% on RA.  General appearance: Alert, cooperative, well nourished, no apparent distress Head:  Atraumatic, normocephalic Eyes: Conjunctivae/corneas clear, PERRLA, EOMI Nose: No drainage or sinus tenderness, dried blood in right nare Neck: No adenopathy, supple, symmetrical, trachea midline, thyroid not enlarged, no tenderness Resp: Clear to auscultation bilaterally, diminished bibasilar breath sounds Cardio: Regular rate and rhythm, S1, S2 normal, no murmur, click, rub or gallop,  left chest Port-A-Cath Breasts: Not examined GI: Soft, distended, non-tender, hypoactive bowel sounds, no organomegaly from a seated position Extremities: Extremities normal, atraumatic, no cyanosis,  bilateral LE pitting edema, LLE (+3)  is larger than RLE (+2) Lymph nodes: Cervical, supraclavicular, and axillary nodes normal Neurologic: Grossly normal    ECOG FS:  Grade 3 - Symptomatic with limited ambulation  LAB RESULTS: Lab Results  Component Value Date   WBC 11.0* 01/10/2013   NEUTROABS 9.3* 01/10/2013   HGB 8.3* 01/10/2013   HCT 25.8* 01/10/2013   MCV 96.6 01/10/2013   PLT 294 01/10/2013      Chemistry      Component Value Date/Time   NA 138 01/10/2013 1538   NA 138 01/05/2013 0540   K 3.7 01/10/2013 1538   K 3.8 01/05/2013 0540   CL 102 01/10/2013 1538   CL 98 01/05/2013 0540   CO2 29 01/10/2013 1538   CO2 35* 01/05/2013 0540   BUN 11.6 01/10/2013 1538   BUN 27* 01/05/2013 0540   CREATININE 0.9 01/10/2013 1538   CREATININE 1.09 01/05/2013 0540      Component Value Date/Time   CALCIUM 9.1 01/10/2013 1538   CALCIUM 8.7 01/05/2013 0540   ALKPHOS 88 01/10/2013 1538   ALKPHOS 62 12/27/2012 1136   AST 32 01/10/2013 1538   AST 39* 12/27/2012 1136   ALT 52 01/10/2013 1538   ALT 36* 12/27/2012 1136   BILITOT 0.30 01/10/2013 1538   BILITOT 0.7 12/27/2012 1136       Lab Results  Component Value Date   LABCA2 16 07/19/2012    RADIOGRAPHIC STUDIES: No results found.   ASSESSMENT: 67 y.o. [place], [place] woman with: 1.  Inflammatory right breast cancer with positive lymph nodes the tumor is ER negative PR negative HER-2/neu positive.   2. Tachycardia   3.  Bilateral DVT and Bilateral PE  4.  Bilateral lower extremity edema  5.  Anemia  PLAN: 1. Patient is s/p neoadjuvant chemotherapy consisting of FEC q 2 weeks x 4 cycles.  Status post weekly Gemcitabine and Herceptin that was started on  11/19/12 and completed on 12/17/2012 after 5 cycles.   Herceptin Qweekly x 12 weeks is planned to  start on 01/14/2013.  Dr. Dwain Sarna to reschedule mastectomy for after patient recovers. Plans for radiation therapy with Dr. Michell Heinrich have been delayed also.  2. The patient will continue Toprol XL.  An office visit with Dr. Gala Romney has been requested.   3. Bilateral DVT and PE.  The patient has a positive Prothrombin Gene Mutation, heterozygous. S/p IVC filter inserted on  12/27/2012. The patient to continue full dose Lovenox.  4.  The patient was given a prescription for Lasix 20 mg PO and Kdur 10 mEq on 01/07/2013.  Her instructions for these medications are to take one of each tonight.  Then she is to weigh herself daily at home in the morning after her first void.  If she has gained 5 or more pounds she is to take a Lasix pill and potassium pill.  The patient was asked to purchase and wear thigh-high compressing stockings.  The patient was also asked to elevate her bilateral extremities as much as possible and to avoid sodium.  5.  The patient is scheduled to receive a transfusion of 2 units PRBCs - 1 unit to be given on 01/11/2013 and the other unit to be given on 01/12/2013.  Pre-medications of Tylenol 650 mg PO x 1 and Benadryl 25 mg IV x 1 to be administered before each unit.  Post-medication of Lasix 20 mg IV to be administered after each unit of PRBCs.  6.  We plan to see Ms. Boldin again on 01/14/2013 for follow up with laboratories of CMP and CBC to be drawn at that time.  Dr. Welton Flakes will make a decision at that time if the planned infusion of Herceptin will proceed.   All questions were answered.  The patient was encouraged to contact us in the interim with any problems, questions or concerns.    Larina Bras, NP-C 01/10/2013, 5:24 PM

## 2013-01-11 ENCOUNTER — Ambulatory Visit (HOSPITAL_BASED_OUTPATIENT_CLINIC_OR_DEPARTMENT_OTHER): Payer: Medicare Other

## 2013-01-11 VITALS — BP 128/84 | HR 76 | Temp 98.3°F | Resp 18

## 2013-01-11 DIAGNOSIS — D649 Anemia, unspecified: Secondary | ICD-10-CM | POA: Diagnosis not present

## 2013-01-11 DIAGNOSIS — C50919 Malignant neoplasm of unspecified site of unspecified female breast: Secondary | ICD-10-CM | POA: Diagnosis not present

## 2013-01-11 DIAGNOSIS — I82409 Acute embolism and thrombosis of unspecified deep veins of unspecified lower extremity: Secondary | ICD-10-CM | POA: Diagnosis not present

## 2013-01-11 DIAGNOSIS — M159 Polyosteoarthritis, unspecified: Secondary | ICD-10-CM | POA: Diagnosis not present

## 2013-01-11 DIAGNOSIS — J45909 Unspecified asthma, uncomplicated: Secondary | ICD-10-CM | POA: Diagnosis not present

## 2013-01-11 DIAGNOSIS — Z8701 Personal history of pneumonia (recurrent): Secondary | ICD-10-CM | POA: Diagnosis not present

## 2013-01-11 DIAGNOSIS — IMO0001 Reserved for inherently not codable concepts without codable children: Secondary | ICD-10-CM | POA: Diagnosis not present

## 2013-01-11 LAB — CYTOMEGALOVIRUS PCR, QUALITATIVE

## 2013-01-11 MED ORDER — HEPARIN SOD (PORK) LOCK FLUSH 100 UNIT/ML IV SOLN
500.0000 [IU] | Freq: Every day | INTRAVENOUS | Status: AC | PRN
  Administered 2013-01-11: 500 [IU]
  Filled 2013-01-11: qty 5

## 2013-01-11 MED ORDER — SODIUM CHLORIDE 0.9 % IJ SOLN
10.0000 mL | INTRAMUSCULAR | Status: AC | PRN
  Administered 2013-01-11: 10 mL
  Filled 2013-01-11: qty 10

## 2013-01-11 MED ORDER — DIPHENHYDRAMINE HCL 50 MG/ML IJ SOLN
25.0000 mg | Freq: Once | INTRAMUSCULAR | Status: AC
  Administered 2013-01-11: 25 mg via INTRAVENOUS

## 2013-01-11 MED ORDER — SODIUM CHLORIDE 0.9 % IV SOLN
250.0000 mL | Freq: Once | INTRAVENOUS | Status: AC
  Administered 2013-01-11: 250 mL via INTRAVENOUS

## 2013-01-11 MED ORDER — FUROSEMIDE 10 MG/ML IJ SOLN
20.0000 mg | Freq: Once | INTRAMUSCULAR | Status: AC
  Administered 2013-01-11: 20 mg via INTRAVENOUS

## 2013-01-11 MED ORDER — ACETAMINOPHEN 325 MG PO TABS
650.0000 mg | ORAL_TABLET | Freq: Every day | ORAL | Status: DC
  Administered 2013-01-11: 650 mg via ORAL

## 2013-01-11 NOTE — Patient Instructions (Signed)
Blood Transfusion   A blood transfusion replaces your blood or some of its parts. Blood is replaced when you have lost blood because of surgery, an accident, or for severe blood conditions like anemia.  You can donate blood to be used on yourself if you have a planned surgery. If you lose blood during that surgery, your own blood can be given back to you.  Any blood given to you is checked to make sure it matches your blood type. Your temperature, blood pressure, and heart rate (vital signs) will be checked often.   GET HELP RIGHT AWAY IF:    You feel sick to your stomach (nauseous) or throw up (vomit).   You have watery poop (diarrhea).   You have shortness of breath or trouble breathing.   You have blood in your pee (urine) or have dark colored pee.   You have chest pain or tightness.   Your eyes or skin turn yellow (jaundice).   You have a temperature by mouth above 102 F (38.9 C), not controlled by medicine.   You start to shake and have chills.   You develop a a red rash (hives) or feel itchy.   You develop lightheadedness or feel confused.   You develop back, joint, or muscle pain.   You do not feel hungry (lost appetite).   You feel tired, restless, or nervous.   You develop belly (abdominal) cramps.  Document Released: 02/13/2009 Document Revised: 02/09/2012 Document Reviewed: 02/13/2009  ExitCare Patient Information 2013 [employer].

## 2013-01-12 ENCOUNTER — Ambulatory Visit (HOSPITAL_BASED_OUTPATIENT_CLINIC_OR_DEPARTMENT_OTHER): Payer: Medicare Other

## 2013-01-12 ENCOUNTER — Other Ambulatory Visit: Payer: Self-pay | Admitting: Emergency Medicine

## 2013-01-12 VITALS — BP 130/60 | HR 80 | Temp 98.2°F | Resp 20

## 2013-01-12 DIAGNOSIS — D649 Anemia, unspecified: Secondary | ICD-10-CM

## 2013-01-12 DIAGNOSIS — C50919 Malignant neoplasm of unspecified site of unspecified female breast: Secondary | ICD-10-CM

## 2013-01-12 MED ORDER — HEPARIN SOD (PORK) LOCK FLUSH 100 UNIT/ML IV SOLN
500.0000 [IU] | Freq: Every day | INTRAVENOUS | Status: DC | PRN
  Filled 2013-01-12: qty 5

## 2013-01-12 MED ORDER — FUROSEMIDE 10 MG/ML IJ SOLN
20.0000 mg | Freq: Once | INTRAMUSCULAR | Status: AC
  Administered 2013-01-12: 20 mg via INTRAVENOUS

## 2013-01-12 MED ORDER — DIPHENHYDRAMINE HCL 25 MG PO CAPS
25.0000 mg | ORAL_CAPSULE | Freq: Once | ORAL | Status: AC
  Administered 2013-01-12: 25 mg via ORAL

## 2013-01-12 MED ORDER — SODIUM CHLORIDE 0.9 % IV SOLN
250.0000 mL | Freq: Once | INTRAVENOUS | Status: AC
  Administered 2013-01-12: 250 mL via INTRAVENOUS

## 2013-01-12 MED ORDER — SODIUM CHLORIDE 0.9 % IJ SOLN
3.0000 mL | INTRAMUSCULAR | Status: DC | PRN
  Filled 2013-01-12: qty 10

## 2013-01-12 MED ORDER — SODIUM CHLORIDE 0.9 % IJ SOLN
10.0000 mL | INTRAMUSCULAR | Status: DC | PRN
  Filled 2013-01-12: qty 10

## 2013-01-12 MED ORDER — ACETAMINOPHEN 325 MG PO TABS
650.0000 mg | ORAL_TABLET | Freq: Once | ORAL | Status: AC
  Administered 2013-01-12: 650 mg via ORAL

## 2013-01-12 MED ORDER — HEPARIN SOD (PORK) LOCK FLUSH 100 UNIT/ML IV SOLN
250.0000 [IU] | INTRAVENOUS | Status: AC | PRN
  Administered 2013-01-12: 250 [IU]
  Filled 2013-01-12: qty 5

## 2013-01-12 NOTE — Patient Instructions (Signed)
Blood Transfusion Information WHAT IS A BLOOD TRANSFUSION? A transfusion is the replacement of blood or some of its parts. Blood is made up of multiple cells which provide different functions.  Red blood cells carry oxygen and are used for blood loss replacement.  White blood cells fight against infection.  Platelets control bleeding.  Plasma helps clot blood.  Other blood products are available for specialized needs, such as hemophilia or other clotting disorders. BEFORE THE TRANSFUSION  Who gives blood for transfusions?   You may be able to donate blood to be used at a later date on yourself (autologous donation).  Relatives can be asked to donate blood. This is generally not any safer than if you have received blood from a stranger. The same precautions are taken to ensure safety when a relative's blood is donated.  Healthy volunteers who are fully evaluated to make sure their blood is safe. This is blood bank blood. Transfusion therapy is the safest it has ever been in the practice of medicine. Before blood is taken from a donor, a complete history is taken to make sure that person has no history of diseases nor engages in risky social behavior (examples are intravenous drug use or sexual activity with multiple partners). The donor's travel history is screened to minimize risk of transmitting infections, such as malaria. The donated blood is tested for signs of infectious diseases, such as HIV and hepatitis. The blood is then tested to be sure it is compatible with you in order to minimize the chance of a transfusion reaction. If you or a relative donates blood, this is often done in anticipation of surgery and is not appropriate for emergency situations. It takes many days to process the donated blood. RISKS AND COMPLICATIONS Although transfusion therapy is very safe and saves many lives, the main dangers of transfusion include:   Getting an infectious disease.  Developing a  transfusion reaction. This is an allergic reaction to something in the blood you were given. Every precaution is taken to prevent this. The decision to have a blood transfusion has been considered carefully by your caregiver before blood is given. Blood is not given unless the benefits outweigh the risks. AFTER THE TRANSFUSION  Right after receiving a blood transfusion, you will usually feel much better and more energetic. This is especially true if your red blood cells have gotten low (anemic). The transfusion raises the level of the red blood cells which carry oxygen, and this usually causes an energy increase.  The nurse administering the transfusion will monitor you carefully for complications. HOME CARE INSTRUCTIONS  No special instructions are needed after a transfusion. You may find your energy is better. Speak with your caregiver about any limitations on activity for underlying diseases you may have. SEEK MEDICAL CARE IF:   Your condition is not improving after your transfusion.  You develop redness or irritation at the intravenous (IV) site. SEEK IMMEDIATE MEDICAL CARE IF:  Any of the following symptoms occur over the next 12 hours:  Shaking chills.  You have a temperature by mouth above 102 F (38.9 C), not controlled by medicine.  Chest, back, or muscle pain.  People around you feel you are not acting correctly or are confused.  Shortness of breath or difficulty breathing.  Dizziness and fainting.  You get a rash or develop hives.  You have a decrease in urine output.  Your urine turns a dark color or changes to pink, red, or brown. Any of the following   symptoms occur over the next 10 days:  You have a temperature by mouth above 102 F (38.9 C), not controlled by medicine.  Shortness of breath.  Weakness after normal activity.  The white part of the eye turns yellow (jaundice).  You have a decrease in the amount of urine or are urinating less often.  Your  urine turns a dark color or changes to pink, red, or brown. Document Released: 11/14/2000 Document Revised: 02/09/2012 Document Reviewed: 07/03/2008 Exit[employer] Patient Information 2013 [employer].  

## 2013-01-13 ENCOUNTER — Inpatient Hospital Stay: Payer: Medicare Other | Admitting: Internal Medicine

## 2013-01-13 LAB — TYPE AND SCREEN
ABO/RH(D): A POS
Antibody Screen: NEGATIVE
Unit division: 0

## 2013-01-14 ENCOUNTER — Telehealth: Payer: Self-pay | Admitting: Oncology

## 2013-01-14 ENCOUNTER — Ambulatory Visit (HOSPITAL_BASED_OUTPATIENT_CLINIC_OR_DEPARTMENT_OTHER): Payer: Medicare Other

## 2013-01-14 ENCOUNTER — Encounter: Payer: Self-pay | Admitting: Oncology

## 2013-01-14 ENCOUNTER — Encounter: Payer: Self-pay | Admitting: Adult Health

## 2013-01-14 ENCOUNTER — Other Ambulatory Visit (HOSPITAL_BASED_OUTPATIENT_CLINIC_OR_DEPARTMENT_OTHER): Payer: Medicare Other | Admitting: Lab

## 2013-01-14 ENCOUNTER — Ambulatory Visit (HOSPITAL_BASED_OUTPATIENT_CLINIC_OR_DEPARTMENT_OTHER): Payer: Medicare Other | Admitting: Adult Health

## 2013-01-14 VITALS — BP 147/88 | HR 93 | Temp 98.4°F | Resp 18 | Ht 61.0 in | Wt 139.2 lb

## 2013-01-14 DIAGNOSIS — C50919 Malignant neoplasm of unspecified site of unspecified female breast: Secondary | ICD-10-CM

## 2013-01-14 DIAGNOSIS — Z171 Estrogen receptor negative status [ER-]: Secondary | ICD-10-CM | POA: Diagnosis not present

## 2013-01-14 DIAGNOSIS — C50119 Malignant neoplasm of central portion of unspecified female breast: Secondary | ICD-10-CM

## 2013-01-14 DIAGNOSIS — C50319 Malignant neoplasm of lower-inner quadrant of unspecified female breast: Secondary | ICD-10-CM

## 2013-01-14 DIAGNOSIS — Z86718 Personal history of other venous thrombosis and embolism: Secondary | ICD-10-CM

## 2013-01-14 DIAGNOSIS — C773 Secondary and unspecified malignant neoplasm of axilla and upper limb lymph nodes: Secondary | ICD-10-CM

## 2013-01-14 DIAGNOSIS — Z86711 Personal history of pulmonary embolism: Secondary | ICD-10-CM | POA: Diagnosis not present

## 2013-01-14 DIAGNOSIS — Z7901 Long term (current) use of anticoagulants: Secondary | ICD-10-CM | POA: Diagnosis not present

## 2013-01-14 DIAGNOSIS — Z5112 Encounter for antineoplastic immunotherapy: Secondary | ICD-10-CM | POA: Diagnosis not present

## 2013-01-14 LAB — CBC WITH DIFFERENTIAL/PLATELET
BASO%: 0.5 % (ref 0.0–2.0)
Basophils Absolute: 0 10*3/uL (ref 0.0–0.1)
EOS%: 1.1 % (ref 0.0–7.0)
HCT: 35.2 % (ref 34.8–46.6)
HGB: 11.7 g/dL (ref 11.6–15.9)
MCH: 30.2 pg (ref 25.1–34.0)
MCHC: 33.2 g/dL (ref 31.5–36.0)
MCV: 91 fL (ref 79.5–101.0)
MONO%: 8.2 % (ref 0.0–14.0)
NEUT%: 77.3 % — ABNORMAL HIGH (ref 38.4–76.8)
RDW: 18.6 % — ABNORMAL HIGH (ref 11.2–14.5)

## 2013-01-14 LAB — COMPREHENSIVE METABOLIC PANEL (CC13)
Alkaline Phosphatase: 85 U/L (ref 40–150)
BUN: 16.1 mg/dL (ref 7.0–26.0)
Glucose: 193 mg/dl — ABNORMAL HIGH (ref 70–99)
Total Bilirubin: 0.32 mg/dL (ref 0.20–1.20)

## 2013-01-14 MED ORDER — TRASTUZUMAB CHEMO INJECTION 440 MG
2.0000 mg/kg | Freq: Once | INTRAVENOUS | Status: AC
  Administered 2013-01-14: 126 mg via INTRAVENOUS
  Filled 2013-01-14: qty 6

## 2013-01-14 MED ORDER — DIPHENHYDRAMINE HCL 25 MG PO CAPS: 50.0000 mg | ORAL_CAPSULE | Freq: Once | ORAL | Status: DC

## 2013-01-14 MED ORDER — SODIUM CHLORIDE 0.9 % IV SOLN
Freq: Once | INTRAVENOUS | Status: AC
  Administered 2013-01-14: 16:00:00 via INTRAVENOUS

## 2013-01-14 MED ORDER — HEPARIN SOD (PORK) LOCK FLUSH 100 UNIT/ML IV SOLN
500.0000 [IU] | Freq: Once | INTRAVENOUS | Status: AC | PRN
  Administered 2013-01-14: 500 [IU]
  Filled 2013-01-14: qty 5

## 2013-01-14 MED ORDER — ACETAMINOPHEN 325 MG PO TABS
650.0000 mg | ORAL_TABLET | Freq: Once | ORAL | Status: AC
  Administered 2013-01-14: 650 mg via ORAL

## 2013-01-14 MED ORDER — SODIUM CHLORIDE 0.9 % IJ SOLN
10.0000 mL | INTRAMUSCULAR | Status: DC | PRN
  Administered 2013-01-14: 10 mL
  Filled 2013-01-14: qty 10

## 2013-01-14 NOTE — Telephone Encounter (Signed)
gv pt appt schedule for March and April.  °

## 2013-01-14 NOTE — Patient Instructions (Addendum)
[facility] Discharge Instructions for Patients Receiving Chemotherapy  Today you received the following chemotherapy agents herceptin To help prevent nausea and vomiting after your treatment, we encourage you to take your nausea medication as directed.   If you develop nausea and vomiting that is not controlled by your nausea medication, call the clinic. If it is after clinic hours your family physician or the after hours number for the clinic or go to the Emergency Department.   BELOW ARE SYMPTOMS THAT SHOULD BE REPORTED IMMEDIATELY:  *FEVER GREATER THAN 100.5 F  *CHILLS WITH OR WITHOUT FEVER  NAUSEA AND VOMITING THAT IS NOT CONTROLLED WITH YOUR NAUSEA MEDICATION  *UNUSUAL SHORTNESS OF BREATH  *UNUSUAL BRUISING OR BLEEDING  TENDERNESS IN MOUTH AND THROAT WITH OR WITHOUT PRESENCE OF ULCERS  *URINARY PROBLEMS  *BOWEL PROBLEMS  UNUSUAL RASH Items with * indicate a potential emergency and should be followed up as soon as possible.  Please let the nurse know about any problems that you may have experienced. Feel free to call the clinic you have any questions or concerns. The clinic phone number is [phone].   I have been informed and understand all the instructions given to me. I know to contact the clinic, my physician, or go to the Emergency Department if any problems should occur. I do not have any questions at this time, but understand that I may call the clinic during office hours   should I have any questions or need assistance in obtaining follow up care.    __________________________________________  _____________  __________ Signature of Patient or Authorized Representative            Date                   Time    __________________________________________ Nurse's Signature

## 2013-01-14 NOTE — Progress Notes (Signed)
[facility]  [phone] [phone]  OFFICE PROGRESS NOTE   PATIENT: Jessica Wells   DOB: 12-15-1945  MR#: [id number]  JYN#:[id number]   QM:VHQION,GEXBMWUXL STEWART, MD Lurline Hare, MD Emelia Loron, MD Arvilla Meres, MD   DIAGNOSIS:  67 year-old [place], [place] female with new diagnosis of inflammatory breast cancer.   PRIOR THERAPY: 1.  The patient was originally seen in clinic for new diagnosis of inflammatory breast cancer, she was referred by Dr. Emelia Loron. She had a mammogram performed on 07/14/2012 that showed an abnormality. That was biopsied and it showed an invasive mammary carcinoma with lymphovascular invasion grade 3 ER negative PR negative HER-2/neu positive.   2.  The patient had a MRI of the breasts performed on 07/19/2012.  The MRI showed diffuse right breast neoplasm on a large level I right axillary lymph nodes compatible with lymphatic spread.  The patient has had a biopsy of the right axillary lymph node that is compatible with invasive mammary carcinoma.   3.  The patient began neoadjuvant FEC 100 with day 2 Neulasta support on 07/23/2012.   4.   Weekly neoadjuvant Taxol and Herceptin starting 09/17/12.  The patient developed neuropathies, and Taxol was discontinued early.  She completed 7 weeks of Taxol/Herceptin combination therapy.  The patient received 2 weeks of Herceptin only.   5. Weekly Gemcitabine and Herceptin was started on 11/19/12 and completed on 12/17/2012 after 5 cycles.   CURRENT THERAPY:  Herceptin Q weekly x 12 weeks planned to start on 01/14/2013.   INTERVAL HISTORY:  Ms. Thau is doing very well today.  Her legs feel much improved.  She has stopped using her oxygen.  She is feeling much improved.  She is down four lbs from her appt last week.  She has to reschedule her pulmonary appt with Dr. Sherene Sires b/c it was cancelled due to snow.  Otherwise, she's doing well and a 10 point ROS is neg.     PAST MEDICAL HISTORY: Past Medical History  Diagnosis Date  . Asthma   . GERD (gastroesophageal reflux disease)   . Dysrhythmia     PAT-sees dr Laurence Compton meds  . Breast cancer 07/14/12    inflammatory right breast ca, ER/PR -  . Allergy   . PAT (paroxysmal atrial tachycardia)     hx  . Arthritis     osteopenia,knees  . PONV (postoperative nausea and vomiting) 09-13-12    severe, with Port-a-cath, was managed without PONV  . Clotting disorder     prothrombin gene mutation-heterozygous   . History of chemotherapy     Last dose 12/17/12.  Was rx'd with Herceptin & Gemzar  . DVT (deep venous thrombosis)     PAST SURGICAL HISTORY: Past Surgical History  Procedure Laterality Date  . Dilation and curettage of uterus      x3  . Tonsillectomy    . Colonoscopy    . Portacath placement  07/21/2012    Procedure: INSERTION PORT-A-CATH;  Surgeon: Emelia Loron, MD;  Location: [facility];  Service: General;  Laterality: Left;  . Insertion of vena cava filter  12/2012    FAMILY HISTORY: Family History  Problem Relation Age of Onset  . Diabetes Mother   . Heart disease Mother   . Hypertension Mother   . Hyperlipidemia Mother   . Breast cancer Mother 19    lobular breast cancer  . Heart disease Maternal Aunt   . Heart disease Maternal Uncle   . Heart  disease Maternal Grandmother     died in her 109s from heart disease  . Lung cancer Father 4    adenocarcinoma  . Cancer Paternal Uncle     dx in mid 11s with a cancer in the leg, died late 55s; grandpaternal half uncle    SOCIAL HISTORY: History  Substance Use Topics  . Smoking status: Former Smoker -- 1.00 packs/day for 35 years    Types: Cigarettes    Quit date: 07/21/1979  . Smokeless tobacco: Never Used  . Alcohol Use: No    ALLERGIES: Allergies  Allergen Reactions  . Anesthetics, Amide Nausea And Vomiting    Projectile vomitting-Nausea- with 24hrs of dry heaves. With any anesthetics  . Bactrim  (Sulfamethoxazole W-Trimethoprim) Other (See Comments)    Massive diarrhea, nausea vomiting, platelets dropped, hemoglobin dropped, admitted to hospital  . Sulfa Antibiotics Nausea And Vomiting and Other (See Comments)    Crazy feeling in head Life threatening reaction, decreased blood counts  . Codeine Nausea And Vomiting  . Codeine Phosphate Nausea And Vomiting  . Eggs Or Egg-Derived Products Hives     MEDICATIONS:  Current Outpatient Prescriptions  Medication Sig Dispense Refill  . albuterol (PROVENTIL HFA;VENTOLIN HFA) 108 (90 BASE) MCG/ACT inhaler Inhale 2 puffs into the lungs every 6 (six) hours as needed.      . Alum & Mag Hydroxide-Simeth (MAGIC MOUTHWASH W/LIDOCAINE) SOLN Take 5 mLs by mouth 4 (four) times daily as needed.  60 mL  0  . b complex vitamins tablet Take 1 tablet by mouth daily.      . calcium-vitamin D (OSCAL WITH D) 500-200 MG-UNIT per tablet Take 1 tablet by mouth daily.      Marland Kitchen enoxaparin (LOVENOX) 80 MG/0.8ML injection Inject 0.7 mLs (70 mg total) into the skin every 12 (twelve) hours.  60 Syringe  3  . fish oil-omega-3 fatty acids 1000 MG capsule Take 2 g by mouth daily.      . Flaxseed, Linseed, (FLAX SEEDS PO) Take 1 tablet by mouth daily.      . fluticasone (FLONASE) 50 MCG/ACT nasal spray Place 1 spray into the nose daily as needed. For allergies      . furosemide (LASIX) 20 MG tablet Take 1 tablet (20 mg total) by mouth as needed (If you gain more than 5 pounds in one day, take one pill. ).  30 tablet  1  . glucosamine-chondroitin 500-400 MG tablet Take 1 tablet by mouth daily.      Marland Kitchen lidocaine-prilocaine (EMLA) cream Apply topically as needed.  30 g  5  . metoprolol succinate (TOPROL XL) 50 MG 24 hr tablet Take 1 tablet (50 mg total) by mouth daily. Take with or immediately following a meal.  30 tablet  3  . Multiple Vitamin (MULTIVITAMIN WITH MINERALS) TABS Take 1 tablet by mouth daily.      Marland Kitchen omeprazole (PRILOSEC) 20 MG capsule Take 20 mg by mouth daily as  needed. For heartburn      . ondansetron (ZOFRAN) 8 MG tablet Take 8 mg by mouth every 8 (eight) hours as needed.       . potassium chloride SA (K-DUR,KLOR-CON) 10 MEQ tablet Take 1 tablet (10 mEq total) by mouth as needed (If you take Lasix, take the potassium pill).  30 tablet  1  . valACYclovir (VALTREX) 500 MG tablet Take 1 tablet (500 mg total) by mouth 3 (three) times daily.  90 tablet  6   No current facility-administered medications for  this visit.      REVIEW OF SYSTEMS: General: fatigue (-), night sweats (-), fever (-), pain (-) Lymph: palpable nodes (-) HEENT: vision changes (-), mucositis (-), gum bleeding (-), epistaxis (-) Cardiovascular: chest pain (-), palpitations (-) Pulmonary: shortness of breath (-), dyspnea on exertion (-), cough (-), hemoptysis (-) GI:  Early satiety (-), melena (-), dysphagia (-), nausea/vomiting (-), diarrhea (-) GU: dysuria (-), hematuria (-), incontinence (-) Musculoskeletal: joint swelling (-), joint pain (-), back pain (-) Neuro: weakness (-), numbness (-), headache (-), confusion (-) Skin: Rash (-), lesions (-), dryness (-) Psych: depression (-), suicidal/homicidal ideation (-), feeling of hopelessness (-)    PHYSICAL EXAMINATION: BP 147/88  Pulse 93  Temp(Src) 98.4 F (36.9 C) (Oral)  Resp 18  Ht 5\' 1"  (1.549 m)  Wt 139 lb 4 oz (63.163 kg)  BMI 26.32 kg/m2   O2 sats 95% on RA.  General appearance: Alert, cooperative, well nourished, no apparent distress Head:  Atraumatic, normocephalic Eyes: Conjunctivae/corneas clear, PERRLA, EOMI Nose: No drainage or sinus tenderness, dried blood in right nare Neck: No adenopathy, supple, symmetrical, trachea midline, thyroid not enlarged, no tenderness Resp: Clear to auscultation bilaterally, diminished bibasilar breath sounds Cardio: Regular rate and rhythm, S1, S2 normal, no murmur, click, rub or gallop, left chest Port-A-Cath Breasts: Not examined GI: Soft, distended, non-tender,  hypoactive bowel sounds, no organomegaly from a seated position Extremities: Extremities normal, atraumatic, no cyanosis,  bilateral LE pitting edema, LLE (+3)  is larger than RLE (+2) Lymph nodes: Cervical, supraclavicular, and axillary nodes normal Neurologic: Grossly normal    ECOG FS:  Grade 3 - Symptomatic with limited ambulation  LAB RESULTS: Lab Results  Component Value Date   WBC 8.2 01/14/2013   NEUTROABS 6.3 01/14/2013   HGB 11.7 01/14/2013   HCT 35.2 01/14/2013   MCV 91.0 01/14/2013   PLT 240 01/14/2013      Chemistry      Component Value Date/Time   NA 138 01/10/2013 1538   NA 138 01/05/2013 0540   K 3.7 01/10/2013 1538   K 3.8 01/05/2013 0540   CL 102 01/10/2013 1538   CL 98 01/05/2013 0540   CO2 29 01/10/2013 1538   CO2 35* 01/05/2013 0540   BUN 11.6 01/10/2013 1538   BUN 27* 01/05/2013 0540   CREATININE 0.9 01/10/2013 1538   CREATININE 1.09 01/05/2013 0540      Component Value Date/Time   CALCIUM 9.1 01/10/2013 1538   CALCIUM 8.7 01/05/2013 0540   ALKPHOS 88 01/10/2013 1538   ALKPHOS 62 12/27/2012 1136   AST 32 01/10/2013 1538   AST 39* 12/27/2012 1136   ALT 52 01/10/2013 1538   ALT 36* 12/27/2012 1136   BILITOT 0.30 01/10/2013 1538   BILITOT 0.7 12/27/2012 1136       Lab Results  Component Value Date   LABCA2 16 07/19/2012    RADIOGRAPHIC STUDIES: No results found.   ASSESSMENT: 67 y.o. [place], [place] woman with: 1.  Inflammatory right breast cancer with positive lymph nodes the tumor is ER negative PR negative HER-2/neu positive.   2. Tachycardia   3.  Bilateral DVT and Bilateral PE  4.  Bilateral lower extremity edema  PLAN: 1. Patient is s/p neoadjuvant chemotherapy consisting of FEC q 2 weeks x 4 cycles.  Status post weekly Gemcitabine and Herceptin that was started on 11/19/12 and completed on 12/17/2012 after 5 cycles.   Herceptin Qweekly x 12 weeks is planned to start on 01/14/2013.  Dr.  Dwain Sarna to reschedule mastectomy for 02/02/13 Plans for radiation therapy  with Dr. Michell Heinrich have been delayed also.  She will have her first appt with Dr. Sherene Sires as soon as it is rescheduled with his office.    2. The patient will continue Toprol XL.  An office visit with Dr. Gala Romney has been scheduled for later this month.   3. Bilateral DVT and PE.  The patient has a positive Prothrombin Gene Mutation, heterozygous. S/p IVC filter inserted on  12/27/2012. The patient to continue full dose Lovenox.  4.  Ms Sampedro is doing well with Lasix 20 mg PO and Kdur 10 mEq on 01/07/2013.  Her weight is improved, and her legs are much less swollen.   6.  We plan to see Ms. Reppond again on 01/21/2013 for follow up with laboratories of CMP and CBC to be drawn at that time.    All questions were answered. The patient knows to call the clinic with any problems, questions or concerns. We can certainly see the patient much sooner if necessary.   I spent 25 minutes counseling the patient face to face. The total time spent in the appointment was 30 minutes.   This case was reviewed with Dr. Welton Flakes.   Cherie Ouch Lyn Hollingshead, NP Medical Oncology [facility] Phone: [phone]

## 2013-01-14 NOTE — Patient Instructions (Signed)
Doing well.  Proceed with treatment.  Please call us if you have any questions or concerns.    

## 2013-01-15 ENCOUNTER — Other Ambulatory Visit: Payer: Self-pay

## 2013-01-17 ENCOUNTER — Telehealth: Payer: Self-pay | Admitting: Oncology

## 2013-01-17 NOTE — Telephone Encounter (Signed)
gv and printed appt schedule for pt for March °

## 2013-01-18 ENCOUNTER — Other Ambulatory Visit: Payer: Self-pay | Admitting: Adult Health

## 2013-01-18 ENCOUNTER — Telehealth: Payer: Self-pay | Admitting: Oncology

## 2013-01-18 DIAGNOSIS — Z8701 Personal history of pneumonia (recurrent): Secondary | ICD-10-CM | POA: Diagnosis not present

## 2013-01-18 DIAGNOSIS — IMO0001 Reserved for inherently not codable concepts without codable children: Secondary | ICD-10-CM | POA: Diagnosis not present

## 2013-01-18 DIAGNOSIS — J45909 Unspecified asthma, uncomplicated: Secondary | ICD-10-CM | POA: Diagnosis not present

## 2013-01-18 DIAGNOSIS — C50919 Malignant neoplasm of unspecified site of unspecified female breast: Secondary | ICD-10-CM | POA: Diagnosis not present

## 2013-01-18 DIAGNOSIS — M159 Polyosteoarthritis, unspecified: Secondary | ICD-10-CM | POA: Diagnosis not present

## 2013-01-18 DIAGNOSIS — I82409 Acute embolism and thrombosis of unspecified deep veins of unspecified lower extremity: Secondary | ICD-10-CM | POA: Diagnosis not present

## 2013-01-18 NOTE — Telephone Encounter (Signed)
S/w pt re appts for 2/21 and 2/28. Also pt already on schedule for echo/Bensim[facility]. Confirmed w/pt she is aware of these appts.

## 2013-01-20 ENCOUNTER — Ambulatory Visit (HOSPITAL_BASED_OUTPATIENT_CLINIC_OR_DEPARTMENT_OTHER)
Admission: RE | Admit: 2013-01-20 | Discharge: 2013-01-20 | Disposition: A | Discharge status: Home / Self Care | Payer: Medicare Other | Source: Ambulatory Visit | Attending: Internal Medicine | Admitting: Internal Medicine

## 2013-01-20 ENCOUNTER — Ambulatory Visit (HOSPITAL_COMMUNITY)
Admission: RE | Admit: 2013-01-20 | Discharge: 2013-01-20 | Disposition: A | Discharge status: Home / Self Care | Payer: Medicare Other | Source: Ambulatory Visit | Attending: Family Medicine | Admitting: Family Medicine

## 2013-01-20 VITALS — BP 150/88 | HR 98 | Wt 141.0 lb

## 2013-01-20 DIAGNOSIS — I471 Supraventricular tachycardia, unspecified: Secondary | ICD-10-CM | POA: Insufficient documentation

## 2013-01-20 DIAGNOSIS — J45909 Unspecified asthma, uncomplicated: Secondary | ICD-10-CM | POA: Diagnosis not present

## 2013-01-20 DIAGNOSIS — K219 Gastro-esophageal reflux disease without esophagitis: Secondary | ICD-10-CM | POA: Insufficient documentation

## 2013-01-20 DIAGNOSIS — Z86711 Personal history of pulmonary embolism: Secondary | ICD-10-CM | POA: Insufficient documentation

## 2013-01-20 DIAGNOSIS — I519 Heart disease, unspecified: Secondary | ICD-10-CM

## 2013-01-20 DIAGNOSIS — C50919 Malignant neoplasm of unspecified site of unspecified female breast: Secondary | ICD-10-CM | POA: Diagnosis not present

## 2013-01-20 DIAGNOSIS — M159 Polyosteoarthritis, unspecified: Secondary | ICD-10-CM | POA: Diagnosis not present

## 2013-01-20 NOTE — Progress Notes (Signed)
*  PRELIMINARY RESULTS* Echocardiogram 2D Echocardiogram has been performed.  Jeryl Columbia 01/20/2013, 11:04 AM

## 2013-01-20 NOTE — Patient Instructions (Addendum)
Follow up in 2 months with an ECHO  Do the following things EVERYDAY: 1) Weigh yourself in the morning before breakfast. Write it down and keep it in a log. 2) Take your medicines as prescribed 3) Eat low salt foods-Limit salt (sodium) to 2000 mg per day.  4) Stay as active as you can everyday 5) Limit all fluids for the day to less than 2 liters 

## 2013-01-20 NOTE — Assessment & Plan Note (Deleted)
ECHo

## 2013-01-20 NOTE — Assessment & Plan Note (Addendum)
ECHO reviewed and discussed during office visit. EF and lateral S' stable. Will discuss with Dr Dwain Sarna the timing of mastectomy surgery. Given recent PE would like to wait at least 4-6 weeks prior to surgery. Has mild edema but this is table for her.   Follow up in 2 months with ECHO.

## 2013-01-21 ENCOUNTER — Encounter: Payer: Self-pay | Admitting: Adult Health

## 2013-01-21 ENCOUNTER — Other Ambulatory Visit: Payer: Self-pay | Admitting: Oncology

## 2013-01-21 ENCOUNTER — Encounter: Payer: Self-pay | Admitting: Family

## 2013-01-21 ENCOUNTER — Ambulatory Visit (HOSPITAL_BASED_OUTPATIENT_CLINIC_OR_DEPARTMENT_OTHER): Payer: Medicare Other

## 2013-01-21 ENCOUNTER — Other Ambulatory Visit (HOSPITAL_BASED_OUTPATIENT_CLINIC_OR_DEPARTMENT_OTHER): Payer: Medicare Other | Admitting: Lab

## 2013-01-21 ENCOUNTER — Ambulatory Visit (HOSPITAL_BASED_OUTPATIENT_CLINIC_OR_DEPARTMENT_OTHER): Payer: Medicare Other | Admitting: Family

## 2013-01-21 ENCOUNTER — Other Ambulatory Visit: Payer: Medicare Other | Admitting: Lab

## 2013-01-21 ENCOUNTER — Ambulatory Visit (INDEPENDENT_AMBULATORY_CARE_PROVIDER_SITE_OTHER)
Admission: RE | Admit: 2013-01-21 | Discharge: 2013-01-21 | Disposition: A | Discharge status: Home / Self Care | Payer: Medicare Other | Source: Ambulatory Visit | Attending: Adult Health | Admitting: Adult Health

## 2013-01-21 ENCOUNTER — Ambulatory Visit: Payer: Medicare Other | Admitting: Family

## 2013-01-21 ENCOUNTER — Telehealth (INDEPENDENT_AMBULATORY_CARE_PROVIDER_SITE_OTHER): Payer: Self-pay

## 2013-01-21 ENCOUNTER — Ambulatory Visit (INDEPENDENT_AMBULATORY_CARE_PROVIDER_SITE_OTHER): Payer: Medicare Other | Admitting: Adult Health

## 2013-01-21 VITALS — BP 130/86 | HR 99 | Temp 98.2°F | Ht 61.0 in | Wt 142.0 lb

## 2013-01-21 VITALS — BP 130/81 | HR 93 | Temp 98.4°F | Resp 20 | Ht 61.0 in | Wt 140.5 lb

## 2013-01-21 DIAGNOSIS — Z5112 Encounter for antineoplastic immunotherapy: Secondary | ICD-10-CM | POA: Diagnosis not present

## 2013-01-21 DIAGNOSIS — I2699 Other pulmonary embolism without acute cor pulmonale: Secondary | ICD-10-CM | POA: Diagnosis not present

## 2013-01-21 DIAGNOSIS — C50919 Malignant neoplasm of unspecified site of unspecified female breast: Secondary | ICD-10-CM | POA: Diagnosis not present

## 2013-01-21 DIAGNOSIS — I82403 Acute embolism and thrombosis of unspecified deep veins of lower extremity, bilateral: Secondary | ICD-10-CM

## 2013-01-21 LAB — CBC WITH DIFFERENTIAL/PLATELET
Basophils Absolute: 0 10*3/uL (ref 0.0–0.1)
Eosinophils Absolute: 0.2 10*3/uL (ref 0.0–0.5)
HGB: 11.5 g/dL — ABNORMAL LOW (ref 11.6–15.9)
MCV: 91.3 fL (ref 79.5–101.0)
MONO#: 0.5 10*3/uL (ref 0.1–0.9)
MONO%: 9.3 % (ref 0.0–14.0)
NEUT#: 3.2 10*3/uL (ref 1.5–6.5)
RBC: 3.8 10*6/uL (ref 3.70–5.45)
RDW: 16.5 % — ABNORMAL HIGH (ref 11.2–14.5)
WBC: 5.3 10*3/uL (ref 3.9–10.3)

## 2013-01-21 LAB — COMPREHENSIVE METABOLIC PANEL (CC13)
ALT: 52 U/L (ref 0–55)
AST: 30 U/L (ref 5–34)
Creatinine: 1 mg/dL (ref 0.6–1.1)
Potassium: 3.9 mEq/L (ref 3.5–5.1)
Total Bilirubin: 0.3 mg/dL (ref 0.20–1.20)

## 2013-01-21 MED ORDER — ONDANSETRON 8 MG/50ML IVPB (CHCC)
8.0000 mg | Freq: Once | INTRAVENOUS | Status: AC
  Administered 2013-01-21: 8 mg via INTRAVENOUS

## 2013-01-21 MED ORDER — HEPARIN SOD (PORK) LOCK FLUSH 100 UNIT/ML IV SOLN
500.0000 [IU] | Freq: Once | INTRAVENOUS | Status: AC | PRN
  Administered 2013-01-21: 500 [IU]
  Filled 2013-01-21: qty 5

## 2013-01-21 MED ORDER — SODIUM CHLORIDE 0.9 % IJ SOLN
10.0000 mL | INTRAMUSCULAR | Status: DC | PRN
  Administered 2013-01-21: 10 mL
  Filled 2013-01-21: qty 10

## 2013-01-21 MED ORDER — SODIUM CHLORIDE 0.9 % IV SOLN
Freq: Once | INTRAVENOUS | Status: AC
  Administered 2013-01-21: 15:00:00 via INTRAVENOUS

## 2013-01-21 MED ORDER — TRASTUZUMAB CHEMO INJECTION 440 MG
2.0000 mg/kg | Freq: Once | INTRAVENOUS | Status: AC
  Administered 2013-01-21: 126 mg via INTRAVENOUS
  Filled 2013-01-21: qty 6

## 2013-01-21 MED ORDER — ACETAMINOPHEN 325 MG PO TABS
650.0000 mg | ORAL_TABLET | Freq: Once | ORAL | Status: AC
  Administered 2013-01-21: 650 mg via ORAL

## 2013-01-21 NOTE — Telephone Encounter (Signed)
LMOM at pt's home and cell. I need to speak to pt about [place] moving her surgery date per Dr Doreen Salvage request.

## 2013-01-21 NOTE — Progress Notes (Signed)
Subjective:    Patient ID: Jessica Wells, female    DOB: 1946-10-23, 67 y.o.   MRN: [id number]  HPI 67 yo female , former smoker-quit 1970.  Admitted by [facility] 12/27/12 for acute resp failure /hypoxia secondary to DVT, PE and HCAP.  Pt w/ recently  Dx right inflammatory breast cancer(dx 08/2012)  undergoing active tx   01/21/2013 [facility] follow up  Pt was admitted 12/27/2012 with acute respiratory failure with hypoxia, secondary to bilateral DVT(dx 1/22), subsequent pulmonary emboli, and ? healthcare associated pneumonia-bilateral pulm infiltrates . Patient has recently been diagnosed with inflammatory breast cancer. October 2013, and was undergoing active treatment. She has a planned mastectomy on 3/5 . Patient had a three-day history of progressive shortness, of breath., On admission. CT chest showed bilateral pulmonary embolism, right greater than left. Significant diffuse, bilateral infiltrates on CT.2-D echo showed an ejection fracture of 60-65%. Coag panel was positive for prothrombin gene mutation-heterozygous. IVC was placed. Patient was started on Lovenox injections. She did require intubation w/ vent support 1/30-2/2. She was treated with aggressive antibiotics, pulmonary  Hygiene.She was given steroid challenge for bilateral pulm infiltrates.  CXR improved prior to discharge w/ near complete resolution of bilateral aspdz .   Since discharge. Patient reports that she is feeling much improved with decreased shortness of breath. She's been only using her oxygen as needed. Inventory walk in the office without notable desaturations.   SH:  Worked in [employer]  Retired.  Former smoker   Review of Systems Constitutional:   No  weight loss, night sweats,  Fevers, chills, + fatigue, or  lassitude.  HEENT:   No headaches,  Difficulty swallowing,  Tooth/dental problems, or  Sore throat,                No sneezing, itching, ear ache, nasal congestion, post nasal drip,   CV:  No  chest pain,  Orthopnea, PND, swelling in lower extremities, anasarca, dizziness, palpitations, syncope.   GI  No heartburn, indigestion, abdominal pain, nausea, vomiting, diarrhea, change in bowel habits, loss of appetite, bloody stools.   Resp: No shortness of breath with exertion or at rest.  No excess mucus, no productive cough,  No non-productive cough,  No coughing up of blood.  No change in color of mucus.  No wheezing.  No chest wall deformity  Skin: no rash or lesions.  GU: no dysuria, change in color of urine, no urgency or frequency.  No flank pain, no hematuria   MS:  No joint pain or swelling.  No decreased range of motion.  No back pain.  Psych:  No change in mood or affect. No depression or anxiety.  No memory loss.         Objective:   Physical Exam GEN: A/Ox3; pleasant , NAD, well nourished   HEENT:  [place]/AT,  EACs-clear, TMs-wnl, NOSE-clear, THROAT-clear, no lesions, no postnasal drip or exudate noted.   NECK:  Supple w/ fair ROM; no JVD; normal carotid impulses w/o bruits; no thyromegaly or nodules palpated; no lymphadenopathy.  RESP  Clear  P & A; w/o, wheezes/ rales/ or rhonchi.no accessory muscle use, no dullness to percussion  CARD:  RRR, no m/r/g  , tr-1+peripheral edema, pulses intact, no cyanosis or clubbing.  GI:   Soft & nt; nml bowel sounds; no organomegaly or masses detected.  Musco: Warm bil, no deformities or joint swelling noted.   Neuro: alert, no focal deficits noted.    Skin: Warm, no lesions or  rashes   Venous Dopplers : 1/22  Findings consistent with acute deep vein thrombosis involving the posterior tibial and popliteal veins of the right lower extremity - Findings consistent with acute deep vein thrombosis involving the proximal popliteal. femoral and common femoral veins of the left lower extremity - No evidence of superficial thrombosis involving the right lower extremity - There is a superficial thrombus of the proximal  greater saphenous vein coursing into and including the saphenofemoral junction of the left lower extremity   CT chest 12/27/12   Examination is positive for bilateral pulmonary embolism, right  greater than left. Overall clot burden is felt to be small to  moderate. No CT evidence of right-sided heart failure.  2. Interval development of extensive bilateral heterogeneous  opacities with slight upper lobe predominance. Differential  considerations are broad and include drug toxicity, though note,  atypical infection may have a similar appearance.  3. Interval development of mediastinal bilateral hilar adenopathy,  again nonspecific and while possibly reactive, metastatic disease  is not excluded.  CXR 01/21/13  Near resolution of pulmonary infiltrates     Assessment & Plan:

## 2013-01-21 NOTE — Patient Instructions (Addendum)
Please contact us at [phone] if you have any questions or concerns. 

## 2013-01-21 NOTE — Telephone Encounter (Signed)
Pt returned my call. I was speaking to her about moving her surgery date per Dr Dwain Sarna and Dr Gala Romney from 3/5 to 3/11 to give her more time to heal from her heart issues. The pt does not want to move her surgery date she said she is fine with the date just the way it is b/c changing the date will require her changing other scheduled dates. The pt said Dr Gala Romney would not make her change the date if Dr Dwain Sarna was ok with proceeding with surgery. I spoke to Dr Dwain Sarna and he is fine with just keeping the date the same for 02/03/11. The pt understands.

## 2013-01-21 NOTE — Patient Instructions (Addendum)
Wear Oxygen As needed   Continue on Lovenox as directed.  Follow up Dr. Sherene Sires  In 6 weeks  Best wishes with your upcoming surgery.  I will call with xray results.

## 2013-01-21 NOTE — Progress Notes (Signed)
[facility]  [phone] [phone]  OFFICE PROGRESS NOTE   PATIENT: Jessica Wells   DOB: February 11, 1946  MR#: [id number]  QMV#:[id number]   MW:[id number] STEWART, MD Lurline Hare, MD Emelia Loron, MD Arvilla Meres, MD Charlaine Dalton. Sherene Sires, MD    DIAGNOSIS:  67 year-old [place], [place] woman with new diagnosis of inflammatory breast cancer.   PRIOR THERAPY: 1.  The patient was originally seen in clinic for new diagnosis of inflammatory breast cancer, she was referred by Dr. Emelia Loron. She had a mammogram performed on 07/14/2012 that showed an abnormality. That was biopsied and it showed an invasive mammary carcinoma with lymphovascular invasion grade 3 ER negative PR negative HER-2/neu positive.   2.  The patient had a MRI of the breasts performed on 07/19/2012.  The MRI showed diffuse right breast neoplasm on a large level I right axillary lymph nodes compatible with lymphatic spread.  The patient has had a biopsy of the right axillary lymph node that is compatible with invasive mammary carcinoma.   3.  The patient began neoadjuvant FEC 100 with day 2 Neulasta support on 07/23/2012.   4.   Weekly neoadjuvant Taxol and Herceptin started on 09/17/12.  The patient developed neuropathies, and Taxol was discontinued early.  She completed 7 weeks of Taxol/Herceptin combination therapy.  The patient received 2 weeks of Herceptin only.   5. Weekly Gemcitabine and Herceptin was started on 11/19/12 and completed on 12/17/2012 after 5 cycles.  6.  A regimen of Lasix 20 mg PO and Kdur 10 mEq was on 01/07/2013 due to chronic lower extremity edema.  Medications are only to be taken if patient has a weight gain of 5 lbs or more in a day (daily weights).   7.  Anemia was treated with blood transfusions of 1 unit of PRBCs given on 01/11/2013 and another unit of PRBC's given 01/12/2013.   CURRENT THERAPY:  Herceptin Q weekly x 12 weeks started  on 01/14/2013.   INTERVAL HISTORY: [name] presents today for her weekly Herceptin infusion.  She was last seen by [place] on 01/14/2013.  Since her last office visit, the patient sates that she is doing better.  [name] has not required supplemental oxygen, she has a noticeable increase in her endurance and she states that her leg swelling has improved and her legs feel better.  She continues to have in-home PT.  Her only complaint today is of nausea after chemotherapy.  The patient denies any other symptomatology.  Ms. Hoganson completed a 2D echo on 01/20/2013 and her EF was 60% -  65%,  with a reported grade 1 diastolic dysfunction.  She states that she is schedule for right modified radical mastectomy surgery with Dr. Dwain Sarna on 02/02/2013.   PAST MEDICAL HISTORY: Past Medical History  Diagnosis Date  . Asthma   . GERD (gastroesophageal reflux disease)   . Dysrhythmia     PAT-sees dr Laurence Compton meds  . Breast cancer 07/14/12    inflammatory right breast ca, ER/PR -  . Allergy   . PAT (paroxysmal atrial tachycardia)     hx  . Arthritis     osteopenia,knees  . PONV (postoperative nausea and vomiting) 09-13-12    severe, with Port-a-cath, was managed without PONV  . Clotting disorder     prothrombin gene mutation-heterozygous   . History of chemotherapy     Last dose 12/17/12.  Was rx'd with Herceptin & Gemzar  . DVT (deep venous thrombosis)  PAST SURGICAL HISTORY: Past Surgical History  Procedure Laterality Date  . Dilation and curettage of uterus      x3  . Tonsillectomy    . Colonoscopy    . Portacath placement  07/21/2012    Procedure: INSERTION PORT-A-CATH;  Surgeon: Emelia Loron, MD;  Location: [facility];  Service: General;  Laterality: Left;  . Insertion of vena cava filter  12/2012    FAMILY HISTORY: Family History  Problem Relation Age of Onset  . Diabetes Mother   . Heart disease Mother   . Hypertension Mother   . Hyperlipidemia Mother    . Breast cancer Mother 75    lobular breast cancer  . Heart disease Maternal Aunt   . Heart disease Maternal Uncle   . Heart disease Maternal Grandmother     died in her 43s from heart disease  . Lung cancer Father 28    adenocarcinoma  . Cancer Paternal Uncle     dx in mid 30s with a cancer in the leg, died late 10s; grandpaternal half uncle    SOCIAL HISTORY: History  Substance Use Topics  . Smoking status: Former Smoker -- 1.00 packs/day for 35 years    Types: Cigarettes    Quit date: 07/21/1979  . Smokeless tobacco: Never Used  . Alcohol Use: No    ALLERGIES: Allergies  Allergen Reactions  . Anesthetics, Amide Nausea And Vomiting    Projectile vomitting-Nausea- with 24hrs of dry heaves. With any anesthetics  . Bactrim (Sulfamethoxazole W-Trimethoprim) Other (See Comments)    Massive diarrhea, nausea vomiting, platelets dropped, hemoglobin dropped, admitted to hospital  . Sulfa Antibiotics Nausea And Vomiting and Other (See Comments)    Crazy feeling in head Life threatening reaction, decreased blood counts  . Codeine Nausea And Vomiting  . Codeine Phosphate Nausea And Vomiting  . Eggs Or Egg-Derived Products Hives     MEDICATIONS:  Current Outpatient Prescriptions  Medication Sig Dispense Refill  . albuterol (PROVENTIL HFA;VENTOLIN HFA) 108 (90 BASE) MCG/ACT inhaler Inhale 2 puffs into the lungs every 6 (six) hours as needed.      Marland Kitchen b complex vitamins tablet Take 1 tablet by mouth daily.      . calcium-vitamin D (OSCAL WITH D) 500-200 MG-UNIT per tablet Take 1 tablet by mouth every other day.       . enoxaparin (LOVENOX) 80 MG/0.8ML injection Inject 0.7 mLs (70 mg total) into the skin every 12 (twelve) hours.  60 Syringe  3  . fish oil-omega-3 fatty acids 1000 MG capsule Take 2 g by mouth daily.      . Flaxseed, Linseed, (FLAX SEEDS PO) Take 1 tablet by mouth daily.      . fluticasone (FLONASE) 50 MCG/ACT nasal spray Place 1 spray into the nose daily as needed.  For allergies      . furosemide (LASIX) 20 MG tablet Take 1 tablet (20 mg total) by mouth as needed (If you gain more than 5 pounds in one day, take one pill. ).  30 tablet  1  . glucosamine-chondroitin 500-400 MG tablet Take 1 tablet by mouth daily.      Marland Kitchen lidocaine-prilocaine (EMLA) cream Apply topically as needed.  30 g  5  . metoprolol succinate (TOPROL XL) 50 MG 24 hr tablet Take 1 tablet (50 mg total) by mouth daily. Take with or immediately following a meal.  30 tablet  3  . Multiple Vitamin (MULTIVITAMIN WITH MINERALS) TABS Take 1 tablet by mouth every  other day.       Marland Kitchen omeprazole (PRILOSEC) 20 MG capsule Take 20 mg by mouth daily as needed. For heartburn      . ondansetron (ZOFRAN) 8 MG tablet Take 8 mg by mouth every 8 (eight) hours as needed.       . potassium chloride SA (K-DUR,KLOR-CON) 10 MEQ tablet Take 1 tablet (10 mEq total) by mouth as needed (If you take Lasix, take the potassium pill).  30 tablet  1   No current facility-administered medications for this visit.   Facility-Administered Medications Ordered in Other Visits  Medication Dose Route Frequency Provider Last Rate Last Dose  . sodium chloride 0.9 % injection 10 mL  10 mL Intracatheter PRN Keitha Butte, NP   10 mL at 01/21/13 1634      REVIEW OF SYSTEMS: A 10 point review of systems was completed and is negative except as noted above.    PHYSICAL EXAMINATION: BP 130/81  Pulse 93  Temp(Src) 98.4 F (36.9 C) (Oral)  Resp 20  Ht 5\' 1"  (1.549 m)  Wt 140 lb 8 oz (63.73 kg)  BMI 26.56 kg/m2     General appearance: Alert, cooperative, well nourished, no apparent distress Head:  Atraumatic, normocephalic Eyes: Conjunctivae/corneas clear, PERRLA, EOMI Nose: No drainage or sinus tenderness Neck: No adenopathy, supple, symmetrical, trachea midline, thyroid not enlarged, no tenderness Resp: Clear to auscultation bilaterally, diminished bibasilar breath sounds Cardio: Regular rate and rhythm, S1, S2 normal,  no murmur, click, rub or gallop, left chest Port-A-Cath covered with EMLA cream Breasts: Not examined GI: Soft, distended, non-tender, hypoactive bowel sounds, no organomegaly Extremities: Extremities normal, atraumatic, no cyanosis, LE edema, LLE (+2)  is larger than RLE (trace edema) Lymph nodes: Cervical, supraclavicular, and axillary nodes normal Neurologic: Grossly normal    ECOG FS:  Grade 2 - Symptomatic, but completely ambulatory  LAB RESULTS: Lab Results  Component Value Date   WBC 5.3 01/21/2013   NEUTROABS 3.2 01/21/2013   HGB 11.5* 01/21/2013   HCT 34.7* 01/21/2013   MCV 91.3 01/21/2013   PLT 223 01/21/2013      Chemistry      Component Value Date/Time   NA 140 01/21/2013 1341   NA 138 01/05/2013 0540   K 3.9 01/21/2013 1341   K 3.8 01/05/2013 0540   CL 103 01/21/2013 1341   CL 98 01/05/2013 0540   CO2 28 01/21/2013 1341   CO2 35* 01/05/2013 0540   BUN 13.7 01/21/2013 1341   BUN 27* 01/05/2013 0540   CREATININE 1.0 01/21/2013 1341   CREATININE 1.09 01/05/2013 0540      Component Value Date/Time   CALCIUM 9.5 01/21/2013 1341   CALCIUM 8.7 01/05/2013 0540   ALKPHOS 76 01/21/2013 1341   ALKPHOS 62 12/27/2012 1136   AST 30 01/21/2013 1341   AST 39* 12/27/2012 1136   ALT 52 01/21/2013 1341   ALT 36* 12/27/2012 1136   BILITOT 0.30 01/21/2013 1341   BILITOT 0.7 12/27/2012 1136      Lab Results  Component Value Date   LABCA2 16 07/19/2012     RADIOGRAPHIC STUDIES: Dg Chest 2 View  01/21/2013  *RADIOLOGY REPORT*  Clinical Data: Shortness of breath.  Follow up pneumonia.  CHEST - 2 VIEW  Comparison: Portable examination 01/04/2013.  Two-view examination 12/27/2012.  Findings: There has been near-complete resolution of the bilateral air space opacities.  Mild residual scarring is present bilaterally.  There is no pleural effusion.  Heart size and mediastinal contours  are stable.  Left subclavian Port-A-Cath is unchanged near the SVC right atrial junction.  IMPRESSION: Interval near complete  resolution of bilateral pneumonia.  No acute findings identified.   Original Report Authenticated By: Carey Bullocks, M.D.     ASSESSMENT: 67 y.o. [place], [place] woman with: 1.  Inflammatory right breast cancer with positive lymph nodes the tumor is ER negative PR negative HER-2/neu positive.   2.  Bilateral DVT and Bilateral PE  3.  Lower extremity edema   PLAN: 1. Patient is s/p neoadjuvant chemotherapy consisting of FEC q 2 weeks x 4 cycles.  Status post weekly Gemcitabine and Herceptin that was started on 11/19/12 and completed on 12/17/2012 after 5 cycles.   Herceptin Qweekly x 12 weeks started on 01/14/2013 and she will receive a Herceptin infusion today.  She will be pre-medicated with IV Zofran 8 mg prior to today's Herceptin infusion.  Dr. Dwain Sarna is scheduled to perform a right breast radical mastectomy on 02/02/2013.  There are plans for radiation therapy with Dr. Michell Heinrich following surgery.    2.  Bilateral DVT and PE.  The patient has a positive Prothrombin Gene Mutation, heterozygous. S/p IVC filter inserted on  12/27/2012. The patient to continue full dose Lovenox.  The patient will discuss presurgical and post surgical anticoagulation therapy with Dr. Dwain Sarna and Dr. Gala Romney.  3.  The patient was given a prescription for Lasix 20 mg PO and Kdur 10 mEq on 01/07/2013.  She is to weigh herself daily at home in the morning after her first void.  If she has gained 5 or more pounds she is to take a Lasix pill and potassium pill.  The patient was asked to purchase and wear thigh-high compressing stockings.  The patient was also asked to elevate her bilateral extremities as much as possible and to avoid sodium.  4.  We plan to see Ms. Scrivner again on 01/28/2013 with laboratories of CMP and CBC to be drawn at that time. She is scheduled to receive a Herceptin infusion on this day as well.   All questions were answered.  The patient was encouraged to contact us in the interim with any  problems, questions or concerns.    Larina Bras, NP-C 01/21/2013, 7:37 PM

## 2013-01-21 NOTE — Patient Instructions (Addendum)
[facility] Discharge Instructions for Patients Receiving Chemotherapy  Today you received the following chemotherapy agents Herceptin To help prevent nausea and vomiting after your treatment, we encourage you to take your nausea medication as directed  If you develop nausea and vomiting that is not controlled by your nausea medication, call the clinic. If it is after clinic hours your family physician or the after hours number for the clinic or go to the Emergency Department.   BELOW ARE SYMPTOMS THAT SHOULD BE REPORTED IMMEDIATELY:  *FEVER GREATER THAN 100.5 F  *CHILLS WITH OR WITHOUT FEVER  NAUSEA AND VOMITING THAT IS NOT CONTROLLED WITH YOUR NAUSEA MEDICATION  *UNUSUAL SHORTNESS OF BREATH  *UNUSUAL BRUISING OR BLEEDING  TENDERNESS IN MOUTH AND THROAT WITH OR WITHOUT PRESENCE OF ULCERS  *URINARY PROBLEMS  *BOWEL PROBLEMS  UNUSUAL RASH Items with * indicate a potential emergency and should be followed up as soon as possible.  One of the nurses will contact you 24 hours after your treatment. Please let the nurse know about any problems that you may have experienced. Feel free to call the clinic you have any questions or concerns. The clinic phone number is [phone].   I have been informed and understand all the instructions given to me. I know to contact the clinic, my physician, or go to the Emergency Department if any problems should occur. I do not have any questions at this time, but understand that I may call the clinic during office hours   should I have any questions or need assistance in obtaining follow up care.    __________________________________________  _____________  __________ Signature of Patient or Authorized Representative            Date                   Time    __________________________________________ Nurse's Signature    

## 2013-01-24 ENCOUNTER — Telehealth: Payer: Self-pay | Admitting: *Deleted

## 2013-01-24 ENCOUNTER — Encounter (HOSPITAL_COMMUNITY): Payer: Self-pay | Admitting: Pharmacy Technician

## 2013-01-24 NOTE — Assessment & Plan Note (Signed)
S/p IVC and lovenox   Plan  Keep on Lovenox  Keep legs elevated.

## 2013-01-24 NOTE — Assessment & Plan Note (Signed)
>  plan  Cont on lovenox  O2 As needed   follow up 6 weeks  and As needed

## 2013-01-24 NOTE — Assessment & Plan Note (Signed)
CXR 01/21/13 much improved w/ near resolution of aspdz   Plan  Cont to folllow  follow up in 6 weeks with cxr

## 2013-01-24 NOTE — Telephone Encounter (Signed)
Message copied by Lowell Bouton on Mon Jan 24, 2013 11:40 AM ------      Message from: Julio Sicks      Created: Mon Jan 24, 2013 11:24 AM         Set her up with Dr. Sherene Sires  For ov prior to 3 /5       He wants to see her before surgery             ----- Message -----         From: Nyoka Cowden, MD         Sent: 01/24/2013  10:54 AM           To: Julio Sicks, NP            This is somewhat high risk as she just had the pe but the RV was probably ok - ideally I should see her preop though if they want [place] to clear her vs defer completely to onc/surgery                   ----- Message -----         From: Julio Sicks, NP         Sent: 01/24/2013   9:58 AM           To: Nyoka Cowden, MD            She is planning mastectomy on 3/5      Will surgery and oncology adjust anticoagulation ???             ------

## 2013-01-24 NOTE — Telephone Encounter (Signed)
Called spoke with patient, appt with MW scheduled for Friday January 28, 2013 @ 1130.  Pt okay with this date and time.

## 2013-01-24 NOTE — Assessment & Plan Note (Addendum)
Much improved , finished abx rx  CXR 01/21/2013 w/ near resolution of aspdz   Plan  follow up as planned in 6 weeks .

## 2013-01-25 ENCOUNTER — Encounter (HOSPITAL_COMMUNITY)
Admission: RE | Admit: 2013-01-25 | Discharge: 2013-01-25 | Disposition: A | Discharge status: Home / Self Care | Payer: Medicare Other | Source: Ambulatory Visit | Attending: General Surgery | Admitting: General Surgery

## 2013-01-25 ENCOUNTER — Encounter (INDEPENDENT_AMBULATORY_CARE_PROVIDER_SITE_OTHER): Payer: Self-pay | Admitting: General Surgery

## 2013-01-25 ENCOUNTER — Ambulatory Visit (INDEPENDENT_AMBULATORY_CARE_PROVIDER_SITE_OTHER): Payer: Medicare Other | Admitting: General Surgery

## 2013-01-25 ENCOUNTER — Encounter (HOSPITAL_COMMUNITY): Payer: Self-pay

## 2013-01-25 VITALS — BP 122/82 | HR 107 | Temp 98.2°F | Resp 18 | Ht 61.0 in | Wt 139.2 lb

## 2013-01-25 DIAGNOSIS — Z79899 Other long term (current) drug therapy: Secondary | ICD-10-CM | POA: Diagnosis not present

## 2013-01-25 DIAGNOSIS — R609 Edema, unspecified: Secondary | ICD-10-CM | POA: Diagnosis not present

## 2013-01-25 DIAGNOSIS — N6039 Fibrosclerosis of unspecified breast: Secondary | ICD-10-CM | POA: Diagnosis not present

## 2013-01-25 DIAGNOSIS — K219 Gastro-esophageal reflux disease without esophagitis: Secondary | ICD-10-CM | POA: Diagnosis not present

## 2013-01-25 DIAGNOSIS — J189 Pneumonia, unspecified organism: Secondary | ICD-10-CM

## 2013-01-25 DIAGNOSIS — C773 Secondary and unspecified malignant neoplasm of axilla and upper limb lymph nodes: Secondary | ICD-10-CM | POA: Diagnosis not present

## 2013-01-25 DIAGNOSIS — J9600 Acute respiratory failure, unspecified whether with hypoxia or hypercapnia: Secondary | ICD-10-CM | POA: Diagnosis not present

## 2013-01-25 DIAGNOSIS — Z5112 Encounter for antineoplastic immunotherapy: Secondary | ICD-10-CM | POA: Diagnosis not present

## 2013-01-25 DIAGNOSIS — Z9289 Personal history of other medical treatment: Secondary | ICD-10-CM

## 2013-01-25 DIAGNOSIS — I2699 Other pulmonary embolism without acute cor pulmonale: Secondary | ICD-10-CM | POA: Diagnosis not present

## 2013-01-25 DIAGNOSIS — C50911 Malignant neoplasm of unspecified site of right female breast: Secondary | ICD-10-CM

## 2013-01-25 DIAGNOSIS — Z9221 Personal history of antineoplastic chemotherapy: Secondary | ICD-10-CM | POA: Diagnosis not present

## 2013-01-25 DIAGNOSIS — C50919 Malignant neoplasm of unspecified site of unspecified female breast: Secondary | ICD-10-CM | POA: Diagnosis not present

## 2013-01-25 DIAGNOSIS — I82409 Acute embolism and thrombosis of unspecified deep veins of unspecified lower extremity: Secondary | ICD-10-CM | POA: Diagnosis not present

## 2013-01-25 DIAGNOSIS — Z86718 Personal history of other venous thrombosis and embolism: Secondary | ICD-10-CM | POA: Diagnosis not present

## 2013-01-25 DIAGNOSIS — N6019 Diffuse cystic mastopathy of unspecified breast: Secondary | ICD-10-CM | POA: Diagnosis not present

## 2013-01-25 DIAGNOSIS — J45909 Unspecified asthma, uncomplicated: Secondary | ICD-10-CM | POA: Diagnosis not present

## 2013-01-25 HISTORY — DX: Pneumonia, unspecified organism: J18.9

## 2013-01-25 HISTORY — DX: Bursopathy, unspecified: M71.9

## 2013-01-25 HISTORY — DX: Personal history of other medical treatment: Z92.89

## 2013-01-25 LAB — SURGICAL PCR SCREEN: MRSA, PCR: NEGATIVE

## 2013-01-25 NOTE — Patient Instructions (Addendum)
20 Jessica Wells  01/25/2013   Your procedure is scheduled on:3-11   -2014  Report to [facility] at     0845   AM .  Call this number if you have problems the morning of surgery: [phone]  Or Presurgical Testing [phone](Naiah Donahoe)      Do not eat food:After Midnight.     Take these medicines the morning of surgery with A SIP OF WATER: Metoprolol. Omeprazole. No Lovenox 12 hours prior to surgery. Bring Proventil Inhaler.   Do not wear jewelry, make-up or nail polish.  Do not wear lotions, powders, or perfumes. You may wear deodorant.  Do not shave 12 hours prior to first CHG shower(legs and under arms).(face and neck okay.)  Do not bring valuables to the hospital.  Contacts, dentures or bridgework,body piercing,  may not be worn into surgery.  Leave suitcase in the car. After surgery it may be brought to your room.  For patients admitted to the hospital, checkout time is 11:00 AM the day of discharge.   Patients discharged the day of surgery will not be allowed to drive home. Must have responsible person with you x 24 hours once discharged.  Name and phone number of your driver: sonLennox Grumbles- [phone] cell  Special Instructions: CHG(Chlorhedine 4%-"Hibiclens","Betasept","Aplicare") Shower Use Special Wash: see special instructions.(avoid face and genitals)   Please read over the following fact sheets that you were given: MRSA Information..    Failure to follow these instructions may result in Cancellation of your surgery.   Patient signature_______________________________________________________

## 2013-01-25 NOTE — Progress Notes (Signed)
Patient ID: Jessica Wells, female   DOB: 06/04/1946, 67 y.o.   MRN: [id number]  Chief Complaint  Patient presents with  . Routine Post Op    2 wk reck br ca    HPI Jessica Wells is a 67 y.o. female.   HPI 67 yof who had right breast inflammatory cancer diagnosed then underwent chemo with good response.  Then she was admitted to hospital for pna and dvt/pe.  She was discharged on 2/5.  She states she is back to about 90% of her self right now. She doesn't really have sob. She does have some minimal rle swelling and the lle edema is improving.  She comes in today to see when we will do her surgery.  Past Medical History  Diagnosis Date  . Asthma   . GERD (gastroesophageal reflux disease)   . Dysrhythmia     PAT-sees dr scains-no meds  . Breast cancer 07/14/12    inflammatory right breast ca, ER/PR -  . Allergy   . PAT (paroxysmal atrial tachycardia)     hx  . Arthritis     osteopenia,knees  . PONV (postoperative nausea and vomiting) 09-13-12    severe, with Port-a-cath, was managed without PONV  . Clotting disorder     prothrombin gene mutation-heterozygous   . History of chemotherapy     Last dose 12/17/12.  Was rx'd with Herceptin & Gemzar  . DVT (deep venous thrombosis)     Past Surgical History  Procedure Laterality Date  . Dilation and curettage of uterus      x3  . Tonsillectomy    . Colonoscopy    . Portacath placement  07/21/2012    Procedure: INSERTION PORT-A-CATH;  Surgeon: Clovis Mankins, MD;  Location: [facility];  Service: General;  Laterality: Left;  . Insertion of vena cava filter  12/2012    Family History  Problem Relation Age of Onset  . Diabetes Mother   . Heart disease Mother   . Hypertension Mother   . Hyperlipidemia Mother   . Breast cancer Mother 83    lobular breast cancer  . Heart disease Maternal Aunt   . Heart disease Maternal Uncle   . Heart disease Maternal Grandmother     died in her 40s from heart disease  . Lung cancer  Father 77    adenocarcinoma  . Cancer Paternal Uncle     dx in mid 40s with a cancer in the leg, died late 40s; grandpaternal half uncle    Social History History  Substance Use Topics  . Smoking status: Former Smoker -- 1.00 packs/day for 35 years    Types: Cigarettes    Quit date: 07/21/1979  . Smokeless tobacco: Never Used  . Alcohol Use: No    Allergies  Allergen Reactions  . Anesthetics, Amide Nausea And Vomiting    Projectile vomitting-Nausea- with 24hrs of dry heaves. With any anesthetics  . Bactrim (Sulfamethoxazole W-Trimethoprim) Other (See Comments)    Massive diarrhea, nausea vomiting, platelets dropped, hemoglobin dropped, admitted to hospital  . Sulfa Antibiotics Nausea And Vomiting and Other (See Comments)    Crazy feeling in head Life threatening reaction, decreased blood counts  . Codeine Nausea And Vomiting  . Codeine Phosphate Nausea And Vomiting  . Eggs Or Egg-Derived Products Hives    Current Outpatient Prescriptions  Medication Sig Dispense Refill  . b complex vitamins tablet Take 1 tablet by mouth daily.      . calcium-vitamin D (OSCAL WITH   D) 500-200 MG-UNIT per tablet Take 1 tablet by mouth every other day.       . enoxaparin (LOVENOX) 80 MG/0.8ML injection Inject 0.7 mLs (70 mg total) into the skin every 12 (twelve) hours.  60 Syringe  3  . fish oil-omega-3 fatty acids 1000 MG capsule Take 2 g by mouth daily.      . Flaxseed, Linseed, (FLAX SEEDS PO) Take 1 tablet by mouth daily.      . glucosamine-chondroitin 500-400 MG tablet Take 1 tablet by mouth daily.      . metoprolol succinate (TOPROL-XL) 50 MG 24 hr tablet Take 50 mg by mouth daily before breakfast. Take with or immediately following a meal.      . Multiple Vitamin (MULTIVITAMIN WITH MINERALS) TABS Take 1 tablet by mouth every other day.       . omeprazole (PRILOSEC) 20 MG capsule Take 20 mg by mouth daily as needed. For heartburn      . ondansetron (ZOFRAN) 8 MG tablet Take 8 mg by mouth  every 8 (eight) hours as needed for nausea.       . furosemide (LASIX) 20 MG tablet Take 20 mg by mouth as needed (If you gain more than 5 pounds in one day, take one pill. ).      . lidocaine-prilocaine (EMLA) cream Apply topically as needed.  30 g  5  . potassium chloride (K-DUR,KLOR-CON) 10 MEQ tablet Take 10 mEq by mouth as needed (If you take Lasix, take the potassium pill).      . PROVENTIL HFA 108 (90 BASE) MCG/ACT inhaler        No current facility-administered medications for this visit.   Facility-Administered Medications Ordered in Other Visits  Medication Dose Route Frequency Provider Last Rate Last Dose  . sodium chloride 0.9 % injection 10 mL  10 mL Intracatheter PRN Jacqueline A Hunter, NP   10 mL at 01/21/13 1634    Review of Systems Review of Systems  Respiratory: Negative for cough and shortness of breath.   Cardiovascular: Negative for chest pain.  Gastrointestinal: Negative for abdominal pain and abdominal distention.    Blood pressure 122/82, pulse 107, temperature 98.2 F (36.8 C), temperature source Temporal, resp. rate 18, height 5' 1" (1.549 m), weight 139 lb 3.2 oz (63.141 kg).  Physical Exam Physical Exam  Vitals reviewed. Constitutional: She appears well-developed and well-nourished.  Neck: Neck supple.  Cardiovascular: Regular rhythm and normal heart sounds.  Tachycardia present.   Pulmonary/Chest: Effort normal and breath sounds normal. She has no wheezes. She has no rales. Right breast exhibits no inverted nipple, no mass, no nipple discharge, no skin change and no tenderness. Left breast exhibits no inverted nipple, no mass, no nipple discharge, no skin change and no tenderness. Breasts are symmetrical.    Lymphadenopathy:    She has no cervical adenopathy.    Data Reviewed BILATERAL BREAST MRI WITH AND WITHOUT CONTRAST  Technique: Multiplanar, multisequence MR images of both breasts  were obtained prior to and following the intravenous  administration  of 13ml of Multihance. Three dimensional images were evaluated at  the independent DynaCad workstation.  Comparison: Bilateral breast MRI 07/19/2012  Findings: The right breast is smaller than the left. There has  been a complete MRI response of the right breast to neoadjuvant  therapy. The previously seen diffuse abnormal enhancement  throughout the right breast on MRI of 07/19/2012 has resolved.  Currently, both breasts have symmetric mild background parenchymal  enhancement pattern   and the vascularity of both breasts is  symmetric. There is no abnormal or asymmetric T2-weighted signal  in either breast.  The left breast remains negative.  There is no axillary lymphadenopathy. Specifically, the right  axillary lymph nodes have significantly decreased in size and are  now symmetric compared to left axillary lymph nodes. There is no  internal mammary chain lymphadenopathy.  IMPRESSION:  1. Complete MRI response of the right breast to neoadjuvant  therapy.  2. The left breast remains negative.  3. Right axillary lymph nodes are now normal in size.    Assessment    Right breast inflammatory cancer s/p chemotherapy Recent dvt, pna    Plan    Right mrm Will move one week to do on 3/11.  We again discussed surgery and risks.         Orton Capell 01/25/2013, 12:10 PM    

## 2013-01-25 NOTE — Pre-Procedure Instructions (Addendum)
01-25-13 EKG 1'14/ CXR 2'14 -Epic. Labs CBC/d, CMP([facility]) - 01-21-13 -Epic. Pt. Also to have additional labs done x2 prior to surgery at [facility]. Will look to use these and note results. W. Carisha Kantor,RN 01-28-13 -C/d, CMP-done -Epic. W. Sahib Pella,RN 02-07-13 1230 - Labs in Epic done 02-04-13([facility])- CBC/d, CMP-noted to be used.W. Kennon Portela

## 2013-01-26 ENCOUNTER — Other Ambulatory Visit: Payer: Self-pay | Admitting: Oncology

## 2013-01-26 LAB — FUNGUS CULTURE W SMEAR

## 2013-01-27 ENCOUNTER — Telehealth: Payer: Self-pay | Admitting: Oncology

## 2013-01-27 NOTE — Telephone Encounter (Signed)
S/w the pt regarding her march 7th appt. Per pt she has r/s her surgery date and will discuss it when she comes in the office tomorrow for her f/u appt. Pt  Aware she will get her new schedule at that time.

## 2013-01-28 ENCOUNTER — Ambulatory Visit (INDEPENDENT_AMBULATORY_CARE_PROVIDER_SITE_OTHER): Payer: Medicare Other | Admitting: Internal Medicine

## 2013-01-28 ENCOUNTER — Ambulatory Visit (HOSPITAL_BASED_OUTPATIENT_CLINIC_OR_DEPARTMENT_OTHER): Payer: Medicare Other | Admitting: Family

## 2013-01-28 ENCOUNTER — Encounter: Payer: Self-pay | Admitting: Family

## 2013-01-28 ENCOUNTER — Encounter: Payer: Self-pay | Admitting: Internal Medicine

## 2013-01-28 ENCOUNTER — Ambulatory Visit (HOSPITAL_BASED_OUTPATIENT_CLINIC_OR_DEPARTMENT_OTHER): Payer: Medicare Other

## 2013-01-28 ENCOUNTER — Other Ambulatory Visit (HOSPITAL_BASED_OUTPATIENT_CLINIC_OR_DEPARTMENT_OTHER): Payer: Medicare Other | Admitting: Lab

## 2013-01-28 ENCOUNTER — Telehealth: Payer: Self-pay | Admitting: Oncology

## 2013-01-28 VITALS — BP 123/81 | HR 96 | Temp 98.4°F | Resp 20 | Ht 61.0 in | Wt 139.0 lb

## 2013-01-28 VITALS — BP 150/102 | HR 112 | Temp 97.9°F | Ht 61.0 in | Wt 138.0 lb

## 2013-01-28 DIAGNOSIS — I2699 Other pulmonary embolism without acute cor pulmonale: Secondary | ICD-10-CM | POA: Diagnosis not present

## 2013-01-28 DIAGNOSIS — I82409 Acute embolism and thrombosis of unspecified deep veins of unspecified lower extremity: Secondary | ICD-10-CM | POA: Diagnosis not present

## 2013-01-28 DIAGNOSIS — C50111 Malignant neoplasm of central portion of right female breast: Secondary | ICD-10-CM

## 2013-01-28 DIAGNOSIS — C50919 Malignant neoplasm of unspecified site of unspecified female breast: Secondary | ICD-10-CM

## 2013-01-28 DIAGNOSIS — Z5112 Encounter for antineoplastic immunotherapy: Secondary | ICD-10-CM

## 2013-01-28 LAB — CBC WITH DIFFERENTIAL/PLATELET
Basophils Absolute: 0.1 10*3/uL (ref 0.0–0.1)
EOS%: 2.2 % (ref 0.0–7.0)
Eosinophils Absolute: 0.1 10*3/uL (ref 0.0–0.5)
HCT: 35.7 % (ref 34.8–46.6)
HGB: 12.1 g/dL (ref 11.6–15.9)
LYMPH%: 30.9 % (ref 14.0–49.7)
MCH: 30.7 pg (ref 25.1–34.0)
MCV: 90.6 fL (ref 79.5–101.0)
MONO%: 10.3 % (ref 0.0–14.0)
NEUT#: 3.6 10*3/uL (ref 1.5–6.5)
NEUT%: 55.7 % (ref 38.4–76.8)
Platelets: 271 10*3/uL (ref <2.0)

## 2013-01-28 LAB — COMPREHENSIVE METABOLIC PANEL
Alkaline Phosphatase: 66 U/L (ref 39–117)
Glucose, Bld: 120 mg/dL — ABNORMAL HIGH (ref 70–99)
Sodium: 138 mEq/L (ref 135–145)
Total Bilirubin: 0.2 mg/dL — ABNORMAL LOW (ref 0.3–1.2)
Total Protein: 7.4 g/dL (ref 6.0–8.3)

## 2013-01-28 MED ORDER — ACETAMINOPHEN 325 MG PO TABS
650.0000 mg | ORAL_TABLET | Freq: Once | ORAL | Status: AC
  Administered 2013-01-28: 650 mg via ORAL

## 2013-01-28 MED ORDER — DIPHENHYDRAMINE HCL 25 MG PO CAPS: 50.0000 mg | ORAL_CAPSULE | Freq: Once | ORAL | Status: DC

## 2013-01-28 MED ORDER — SODIUM CHLORIDE 0.9 % IV SOLN
Freq: Once | INTRAVENOUS | Status: AC
  Administered 2013-01-28: 17:00:00 via INTRAVENOUS

## 2013-01-28 MED ORDER — HEPARIN SOD (PORK) LOCK FLUSH 100 UNIT/ML IV SOLN
500.0000 [IU] | Freq: Once | INTRAVENOUS | Status: DC | PRN
  Filled 2013-01-28: qty 5

## 2013-01-28 MED ORDER — TRASTUZUMAB CHEMO INJECTION 440 MG
2.0000 mg/kg | Freq: Once | INTRAVENOUS | Status: AC
  Administered 2013-01-28: 126 mg via INTRAVENOUS
  Filled 2013-01-28: qty 6

## 2013-01-28 MED ORDER — SODIUM CHLORIDE 0.9 % IJ SOLN
10.0000 mL | INTRAMUSCULAR | Status: DC | PRN
  Filled 2013-01-28: qty 10

## 2013-01-28 NOTE — Telephone Encounter (Signed)
Added inf for for 3/7 after lb/fu. Time remains the same. Pt will be given schedule in inf.

## 2013-01-28 NOTE — Patient Instructions (Addendum)
Plan to see Dr Delton Coombes 2 weeks after surgery for final follow up and approval to stop your oxygen  Discuss your prothrombin gene mutation with Dr Park Breed in terms of the impact on your health longterm and family members

## 2013-01-28 NOTE — Patient Instructions (Signed)
[facility] Discharge Instructions for Patients Receiving Chemotherapy  Today you received the following chemotherapy agents Herceptin To help prevent nausea and vomiting after your treatment, we encourage you to take your nausea medication as prescribed.  If you develop nausea and vomiting that is not controlled by your nausea medication, call the clinic. If it is after clinic hours your family physician or the after hours number for the clinic or go to the Emergency Department.   BELOW ARE SYMPTOMS THAT SHOULD BE REPORTED IMMEDIATELY:  *FEVER GREATER THAN 100.5 F  *CHILLS WITH OR WITHOUT FEVER  NAUSEA AND VOMITING THAT IS NOT CONTROLLED WITH YOUR NAUSEA MEDICATION  *UNUSUAL SHORTNESS OF BREATH  *UNUSUAL BRUISING OR BLEEDING  TENDERNESS IN MOUTH AND THROAT WITH OR WITHOUT PRESENCE OF ULCERS  *URINARY PROBLEMS  *BOWEL PROBLEMS  UNUSUAL RASH Items with * indicate a potential emergency and should be followed up as soon as possible.  One of the nurses will contact you 24 hours after your treatment. Please let the nurse know about any problems that you may have experienced. Feel free to call the clinic you have any questions or concerns. The clinic phone number is [phone].   I have been informed and understand all the instructions given to me. I know to contact the clinic, my physician, or go to the Emergency Department if any problems should occur. I do not have any questions at this time, but understand that I may call the clinic during office hours   should I have any questions or need assistance in obtaining follow up care.    __________________________________________  _____________  __________ Signature of Patient or Authorized Representative            Date                   Time    __________________________________________ Nurse's Signature    

## 2013-01-28 NOTE — Patient Instructions (Addendum)
Please contact us at [phone] if you have any questions or concerns. 

## 2013-01-28 NOTE — Progress Notes (Signed)
[facility]  [phone]) [phone] Fax:([phone]) [phone]  OFFICE PROGRESS NOTE   PATIENT: Jessica Wells   DOB: 04-Nov-1946  MR#: [id number]  JYN#:[id number]   QM:VHQION,GEXBMWUXL STEWART, MD Lurline Hare, MD Emelia Loron, MD Arvilla Meres, MD Charlaine Dalton. Sherene Sires, MD    DIAGNOSIS:  67 year-old [place], [place] woman with a recent diagnosis of inflammatory breast cancer.   PRIOR THERAPY: 1.  The patient was originally seen in clinic for new diagnosis of inflammatory breast cancer, she was referred by Dr. Emelia Loron. She had a mammogram performed on 07/14/2012 that showed an abnormality. That was biopsied and it showed an invasive mammary carcinoma with lymphovascular invasion grade 3 ER negative PR negative HER-2/neu positive.   2.  The patient had a MRI of the breasts performed on 07/19/2012.  The MRI showed diffuse right breast neoplasm on a large level I right axillary lymph nodes compatible with lymphatic spread.  The patient has had a biopsy of the right axillary lymph node that is compatible with invasive mammary carcinoma.   3.  The patient began neoadjuvant FEC 100 with day 2 Neulasta support on 07/23/2012.   4.   Weekly neoadjuvant Taxol and Herceptin started on 09/17/12.  The patient developed neuropathies, and Taxol was discontinued early.  She completed 7 weeks of Taxol/Herceptin combination therapy.  The patient received 2 weeks of Herceptin only.   5. Weekly Gemcitabine and Herceptin was started on 11/19/12 and completed on 12/17/2012 after 5 cycles.  6.  A regimen of Lasix 20 mg PO and Kdur 10 mEq was started on 01/07/2013 due to chronic lower extremity edema.  Medications are only to be taken if patient has a weight gain of 5 lbs or more in a day (daily weights).   7.  Anemia was treated with blood transfusions of 1 unit of PRBCs given on 01/11/2013 and another unit of PRBC's given 01/12/2013.   CURRENT THERAPY:  Herceptin Qweekly x 12  weeks started on 01/14/2013.   INTERVAL HISTORY: Jessica Wells presents today for her weekly Herceptin infusion.  She was last seen by [place] on 01/21/2013.  Since her last office visit, the patient sates that she continues to do well.  Jessica Wells has not required supplemental oxygen, she has a noticeable increase in her endurance and she states that her leg swelling has improved.  She states that she is scheduled for right modified radical mastectomy surgery with Dr. Dwain Sarna on 02/08/2013 and was seen by him earlier this week.  The patient's biggest concern is concerning the cost of the Lovenox injections she is taking.  She states the cost has increased tremendously for her and she cannot afford this medication.  Dr. Welton Flakes explained to her that they will discuss switching to another anticoagulant therapy (possible Coumadin) after her surgery, but she needs to continue the Lovenox as prescribed up until 24 hours before her surgery.   PAST MEDICAL HISTORY: Past Medical History  Diagnosis Date  . GERD (gastroesophageal reflux disease)   . Dysrhythmia     PAT-sees dr Laurence Compton meds  . Breast cancer 07/14/12    inflammatory right breast ca, ER/PR -  . Allergy   . PAT (paroxysmal atrial tachycardia)     hx  . Arthritis     osteopenia,knees  . PONV (postoperative nausea and vomiting) 09-13-12    severe, with Port-a-cath, was managed without PONV  . Clotting disorder     prothrombin gene mutation-heterozygous   . History of chemotherapy  Last dose to be 02-04-13.  Was rx'd with Herceptin & Gemzar  . DVT (deep venous thrombosis)   . Asthma     Uses Inhalers Proventil as needed  . Bursitis 01-25-13    Rt. shoulder- mildly affected now.  . History of blood transfusion 01-25-13    2 units 01-11-13  . Pneumonia 01-25-13    hx. 12-27-12-hospital stay x 9 days, now resolved.    PAST SURGICAL HISTORY: Past Surgical History  Procedure Laterality Date  . Dilation and curettage of uterus      x3  .  Tonsillectomy    . Colonoscopy    . Portacath placement  07/21/2012    Procedure: INSERTION PORT-A-CATH;  Surgeon: Emelia Loron, MD;  Location: [facility];  Service: General;  Laterality: Left;/ Replacement done 10'13  . Insertion of vena cava filter  12/2012  . Giant cell tumor  01-25-13    removed Rt. forearm.  . Eye lid surgery      right( MD office)    FAMILY HISTORY: Family History  Problem Relation Age of Onset  . Diabetes Mother   . Heart disease Mother   . Hypertension Mother   . Hyperlipidemia Mother   . Breast cancer Mother 92    lobular breast cancer  . Heart disease Maternal Aunt   . Heart disease Maternal Uncle   . Heart disease Maternal Grandmother     died in her 54s from heart disease  . Lung cancer Father 28    adenocarcinoma  . Cancer Paternal Uncle     dx in mid 10s with a cancer in the leg, died late 69s; grandpaternal half uncle    SOCIAL HISTORY: History  Substance Use Topics  . Smoking status: Former Smoker -- 1.00 packs/day for 35 years    Types: Cigarettes    Quit date: 07/21/1979  . Smokeless tobacco: Never Used  . Alcohol Use: No    ALLERGIES: Allergies  Allergen Reactions  . Anesthetics, Amide Nausea And Vomiting    Projectile vomitting-Nausea- with 24hrs of dry heaves. With any anesthetics  . Bactrim (Sulfamethoxazole W-Trimethoprim) Other (See Comments)    Massive diarrhea, nausea vomiting, platelets dropped, hemoglobin dropped, admitted to hospital  . Sulfa Antibiotics Nausea And Vomiting and Other (See Comments)    Crazy feeling in head Life threatening reaction, decreased blood counts  . Codeine Nausea And Vomiting  . Codeine Phosphate Nausea And Vomiting  . Eggs Or Egg-Derived Products Hives     MEDICATIONS:  Current Outpatient Prescriptions  Medication Sig Dispense Refill  . enoxaparin (LOVENOX) 80 MG/0.8ML injection Inject 0.7 mLs (70 mg total) into the skin every 12 (twelve) hours.  60 Syringe  3  .  lidocaine-prilocaine (EMLA) cream Apply topically as needed.  30 g  5  . metoprolol succinate (TOPROL-XL) 50 MG 24 hr tablet Take 50 mg by mouth daily before breakfast. Take with or immediately following a meal.      . omeprazole (PRILOSEC) 20 MG capsule Take 20 mg by mouth daily as needed. For heartburn      . ondansetron (ZOFRAN) 8 MG tablet Take 8 mg by mouth every 8 (eight) hours as needed for nausea.       Marland Kitchen b complex vitamins tablet Take 1 tablet by mouth daily.      . calcium-vitamin D (OSCAL WITH D) 500-200 MG-UNIT per tablet Take 1 tablet by mouth every other day.       . fish oil-omega-3 fatty acids  1000 MG capsule Take 2 g by mouth daily.      . Flaxseed, Linseed, (FLAX SEEDS PO) Take 1 tablet by mouth daily.      . furosemide (LASIX) 20 MG tablet Take 20 mg by mouth as needed (If you gain more than 5 pounds in one day, take one pill. ).      Marland Kitchen glucosamine-chondroitin 500-400 MG tablet Take 1 tablet by mouth daily.      . Multiple Vitamin (MULTIVITAMIN WITH MINERALS) TABS Take 1 tablet by mouth every other day.       . potassium chloride (K-DUR,KLOR-CON) 10 MEQ tablet Take 10 mEq by mouth as needed (If you take Lasix, take the potassium pill).      Marland Kitchen PROVENTIL HFA 108 (90 BASE) MCG/ACT inhaler        No current facility-administered medications for this visit.   Facility-Administered Medications Ordered in Other Visits  Medication Dose Route Frequency Provider Last Rate Last Dose  . sodium chloride 0.9 % injection 10 mL  10 mL Intracatheter PRN Keitha Butte, NP   10 mL at 01/21/13 1634      REVIEW OF SYSTEMS: A 10 point review of systems was completed and is negative except as noted above.    PHYSICAL EXAMINATION: BP 123/81  Pulse 96  Temp(Src) 98.4 F (36.9 C) (Oral)  Resp 20  Ht 5\' 1"  (1.549 m)  Wt 139 lb (63.05 kg)  BMI 26.28 kg/m2     General appearance: Alert, cooperative, well nourished, no apparent distress Head:  Atraumatic, normocephalic Eyes:  Conjunctivae/corneas clear, PERRLA, EOMI Nose: No drainage or sinus tenderness Neck: No adenopathy, supple, symmetrical, trachea midline, thyroid not enlarged, no tenderness Resp: Clear to auscultation bilaterally, diminished bibasilar breath sounds Cardio: Regular rate and rhythm, S1, S2 normal, no murmur, click, rub or gallop, left chest Port-A-Cath covered with EMLA cream Breasts: Not examined GI: Soft, distended, non-tender, hypoactive bowel sounds, no organomegaly Extremities: Extremities normal, atraumatic, no cyanosis, LE edema, LLE (+2)  is larger than RLE (trace edema) Lymph nodes: Cervical, supraclavicular, and axillary nodes normal Neurologic: Grossly normal    ECOG FS:  Grade 2 - Symptomatic, but completely ambulatory  LAB RESULTS: Lab Results  Component Value Date   WBC 6.4 01/28/2013   NEUTROABS 3.6 01/28/2013   HGB 12.1 01/28/2013   HCT 35.7 01/28/2013   MCV 90.6 01/28/2013   PLT 271 01/28/2013      Chemistry      Component Value Date/Time   NA 138 01/28/2013 1630   NA 140 01/21/2013 1341   K 3.9 01/28/2013 1630   K 3.9 01/21/2013 1341   CL 102 01/28/2013 1630   CL 103 01/21/2013 1341   CO2 29 01/28/2013 1630   CO2 28 01/21/2013 1341   BUN 13 01/28/2013 1630   BUN 13.7 01/21/2013 1341   CREATININE 0.94 01/28/2013 1630   CREATININE 1.0 01/21/2013 1341      Component Value Date/Time   CALCIUM 9.0 01/28/2013 1630   CALCIUM 9.5 01/21/2013 1341   ALKPHOS 66 01/28/2013 1630   ALKPHOS 76 01/21/2013 1341   AST 36 01/28/2013 1630   AST 30 01/21/2013 1341   ALT 55* 01/28/2013 1630   ALT 52 01/21/2013 1341   BILITOT 0.2* 01/28/2013 1630   BILITOT 0.30 01/21/2013 1341      Lab Results  Component Value Date   LABCA2 16 07/19/2012     RADIOGRAPHIC STUDIES: No results found.  ASSESSMENT: 67 y.o. [place], [place] woman  with: 1.  Inflammatory right breast cancer with positive lymph nodes the tumor is ER negative PR negative HER-2/neu positive.   2.  Bilateral DVT and Bilateral  PE  3.  Lower extremity edema   PLAN: 1. Patient is s/p neoadjuvant chemotherapy consisting of FEC q 2 weeks x 4 cycles.  Status post weekly Gemcitabine and Herceptin that was started on 11/19/12 and completed on 12/17/2012 after 5 cycles.   Herceptin Qweekly x 12 weeks started on 01/14/2013 and she will receive a Herceptin infusion today.  Dr. Dwain Sarna is scheduled to perform a right breast radical mastectomy on 02/08/2013.  There are plans for radiation therapy with Dr. Michell Heinrich following surgery.    2.  Bilateral DVT and PE.  The patient has a positive Prothrombin Gene Mutation, heterozygous. S/p IVC filter inserted on  12/27/2012. The patient to continue full dose Lovenox.  She will continue full dose Lovenox until 24 hours before her surgery.  She and Dr. Welton Flakes will discuss post surgical anticoagulation therapy after her surgery.  Dr. Welton Flakes has discussed possibly transitioning her to Coumadin after her surgery.  3.  The patient was given a prescription for Lasix 20 mg PO and Kdur 10 mEq on 01/07/2013.  She is to weigh herself daily at home in the morning after her first void.  If she has gained 5 or more pounds she is to take a Lasix pill and potassium pill.  The patient was asked to purchase and wear thigh-high compressing stockings.  The patient was also asked to elevate her bilateral extremities as much as possible and to avoid sodium.  4.  We plan to see Ms. Fratus again on 02/04/2013 with laboratories of CMP and CBC to be drawn at that time. She is scheduled to receive a Herceptin infusion on that day as well.   All questions were answered.  The patient was encouraged to contact us in the interim with any problems, questions or concerns.    Larina Bras, NP-C 01/29/2013, 9:45 PM

## 2013-01-28 NOTE — Progress Notes (Signed)
Subjective:    Patient ID: Jessica Wells, female    DOB: September 17, 1946, 67 y.o.   MRN: [id number]  HPI 67 yo female , former smoker-quit 1970.  Admitted by [facility] 12/27/12 for acute resp failure /hypoxia secondary to DVT, PE and HCAP.  Pt w/ recently  Dx right inflammatory breast cancer(dx 08/2012)  undergoing active tx   01/21/2013 [facility] follow up  Pt was admitted 12/27/2012 with acute respiratory failure with hypoxia, secondary to bilateral DVT(dx 1/22), subsequent pulmonary emboli, and ? healthcare associated pneumonia-bilateral pulm infiltrates .   diagnosed with inflammatory breast cancer. October 2013, and was undergoing active treatment. She has a planned mastectomy on 3/5 . Patient had a three-day history of progressive shortness, of breath., On admission. CT chest showed bilateral pulmonary embolism, right greater than left. Significant diffuse, bilateral infiltrates on CT.2-D echo showed an ejection fracture of 60-65%. Coag panel was positive for prothrombin gene mutation-heterozygous. IVC was placed. Patient was started on Lovenox injections. She did require intubation w/ vent support 1/30-2/2. She was treated with  antibiotics, pulmonary  Hygiene.She was given steroid challenge for bilateral pulm infiltrates.  CXR improved prior to discharge w/ near complete resolution of bilateral aspdz .   Since discharge.  > much improved with decreased shortness of breath. She's been only using her oxygen as needed. Inventory walk in the office without notable desaturations.   01/28/2013 f/u ov/Jessica Wells cc no doe including steps, houswork, walks x 10 min outside with includes, ok lying flat and no cp,  L > R  leg swelling. No obvious daytime variabilty or assoc chronic cough or cp or chest tightness, subjective wheeze overt sinus or hb symptoms. No unusual exp hx or h/o childhood pna/ asthma or premature birth to her knowledge.   Sleeping ok without nocturnal  or early am exacerbation  of  respiratory  c/o's or need for noct saba. Also denies any obvious fluctuation of symptoms with weather or environmental changes or other aggravating or alleviating factors except as outlined above   ROS  The following are not active complaints unless bolded sore throat, dysphagia, dental problems, itching, sneezing,  nasal congestion or excess/ purulent secretions, ear ache,   fever, chills, sweats, unintended wt loss, pleuritic or exertional cp, hemoptysis,  orthopnea pnd or leg swelling, presyncope, palpitations, heartburn, abdominal pain, anorexia, nausea, vomiting, diarrhea  or change in bowel or urinary habits, change in stools or urine, dysuria,hematuria,  rash, arthralgias, visual complaints, headache, numbness weakness or ataxia or problems with walking or coordination,  change in mood/affect or memory.         Social Hx  Worked in [employer]  Retired.  Former smoker               Objective:   Physical Exam Wt Readings from Last 3 Encounters:  01/28/13 139 lb (63.05 kg)  01/28/13 138 lb (62.596 kg)  01/25/13 140 lb 2 oz (63.56 kg)    GEN: A/Ox3; pleasant , NAD, well nourished   HEENT:  [place]/AT,  EACs-clear, TMs-wnl, NOSE-clear, THROAT-clear, no lesions, no postnasal drip or exudate noted.   NECK:  Supple w/ fair ROM; no JVD; normal carotid impulses w/o bruits; no thyromegaly or nodules palpated; no lymphadenopathy.  RESP  Clear  P & A; w/o, wheezes/ rales/ or rhonchi.no accessory muscle use, no dullness to percussion  CARD:  RRR, no m/r/g  , tr-1+peripheral edema, pulses intact, no cyanosis or clubbing.  GI:   Soft & nt; nml bowel sounds; no  organomegaly or masses detected.  Musco: Warm bil, no deformities or joint swelling noted.   Neuro: alert, no focal deficits noted.    Skin: Warm, no lesions or rashes   Venous Dopplers : 1/22  Findings consistent with acute deep vein thrombosis involving the posterior tibial and popliteal veins of the right lower  extremity - Findings consistent with acute deep vein thrombosis involving the proximal popliteal. femoral and common femoral veins of the left lower extremity - No evidence of superficial thrombosis involving the right lower extremity - There is a superficial thrombus of the proximal greater saphenous vein coursing into and including the saphenofemoral junction of the left lower extremity   CT chest 12/27/12   Examination is positive for bilateral pulmonary embolism, right  greater than left. Overall clot burden is felt to be small to  moderate. No CT evidence of right-sided heart failure.  2. Interval development of extensive bilateral heterogeneous  opacities with slight upper lobe predominance. Differential  considerations are broad and include drug toxicity, though note,  atypical infection may have a similar appearance.  3. Interval development of mediastinal bilateral hilar adenopathy,  again nonspecific and while possibly reactive, metastatic disease  is not excluded.  CXR 01/21/13  Near resolution of pulmonary infiltrates     Assessment & Plan:

## 2013-01-29 ENCOUNTER — Encounter: Payer: Self-pay | Admitting: Internal Medicine

## 2013-01-30 NOTE — Progress Notes (Signed)
Patient ID: Jessica Wells, female   DOB: [birth year], 67 y.o.   MRN: [id number] Oncologist: Dr Welton Flakes General Surgeon: Dr Dwain Sarna  HPI: 67 year old female with a history of tachycardia, inflammatory right breast cancer with positive lymph nodes the tumor is ER negative PR negative HER-2/neu positive. Plans to receive neoadjuvant chemotherapy intially consisting of FEC q 2 weeks x 4 cycles. She is now completing taxol and herceptin q week x 12 weeks.  07/22/12 ECHO EF 60-65% 01/19/13 ECHO 55-60% lateral S' 13.0 No evidence of RV strain.   Admitted to [facility] 12/27/12 with increased dyspnea. Respiratory Failure. Bilateral pulmonary embolism and pulmonary infiltrates. Bilateral lower extremity DVT.  [place] filter placed while hospitalized. Discharged from [facility] 01/05/13.  She returns for post hospital follow up. Feel ok. Denies SOB/PND/Orthopnea. Weight at home 138 -140 pounds. She has not required any lasix. Compliant with medications. Plan for mastectomy 02/02/13 by Dr Dwain Sarna. She continues on Herceptin every week until mastectomy performed then she will continue Herceptin every 3 weeks.       Past Medical History  Diagnosis Date  . GERD (gastroesophageal reflux disease)   . Dysrhythmia     PAT-sees dr Laurence Compton meds  . Breast cancer 07/14/12    inflammatory right breast ca, ER/PR -  . Allergy   . PAT (paroxysmal atrial tachycardia)     hx  . Arthritis     osteopenia,knees  . PONV (postoperative nausea and vomiting) 09-13-12    severe, with Port-a-cath, was managed without PONV  . Clotting disorder     prothrombin gene mutation-heterozygous   . History of chemotherapy     Last dose to be 02-04-13.  Was rx'd with Herceptin & Gemzar  . DVT (deep venous thrombosis)   . Asthma     Uses Inhalers Proventil as needed  . Bursitis 01-25-13    Rt. shoulder- mildly affected now.  . History of blood transfusion 01-25-13    2 units 01-11-13  . Pneumonia 01-25-13    hx. 12-27-12-hospital stay x 9 days, now  resolved.    Current Outpatient Prescriptions  Medication Sig Dispense Refill  . b complex vitamins tablet Take 1 tablet by mouth daily.      . calcium-vitamin D (OSCAL WITH D) 500-200 MG-UNIT per tablet Take 1 tablet by mouth every other day.       . enoxaparin (LOVENOX) 80 MG/0.8ML injection Inject 0.7 mLs (70 mg total) into the skin every 12 (twelve) hours.  60 Syringe  3  . fish oil-omega-3 fatty acids 1000 MG capsule Take 2 g by mouth daily.      . Flaxseed, Linseed, (FLAX SEEDS PO) Take 1 tablet by mouth daily.      Marland Kitchen glucosamine-chondroitin 500-400 MG tablet Take 1 tablet by mouth daily.      Marland Kitchen lidocaine-prilocaine (EMLA) cream Apply topically as needed.  30 g  5  . Multiple Vitamin (MULTIVITAMIN WITH MINERALS) TABS Take 1 tablet by mouth every other day.       Marland Kitchen omeprazole (PRILOSEC) 20 MG capsule Take 20 mg by mouth daily as needed. For heartburn      . ondansetron (ZOFRAN) 8 MG tablet Take 8 mg by mouth every 8 (eight) hours as needed for nausea.       . furosemide (LASIX) 20 MG tablet Take 20 mg by mouth as needed (If you gain more than 5 pounds in one day, take one pill. ).      . metoprolol succinate (TOPROL-XL) 50 MG 24  hr tablet Take 50 mg by mouth daily before breakfast. Take with or immediately following a meal.      . potassium chloride (K-DUR,KLOR-CON) 10 MEQ tablet Take 10 mEq by mouth as needed (If you take Lasix, take the potassium pill).      Marland Kitchen PROVENTIL HFA 108 (90 BASE) MCG/ACT inhaler        No current facility-administered medications for this encounter.   Facility-Administered Medications Ordered in Other Encounters  Medication Dose Route Frequency Provider Last Rate Last Dose  . sodium chloride 0.9 % injection 10 mL  10 mL Intracatheter PRN Keitha Butte, NP   10 mL at 01/21/13 1634     Allergies  Allergen Reactions  . Anesthetics, Amide Nausea And Vomiting    Projectile vomitting-Nausea- with 24hrs of dry heaves. With any anesthetics  . Bactrim  (Sulfamethoxazole W-Trimethoprim) Other (See Comments)    Massive diarrhea, nausea vomiting, platelets dropped, hemoglobin dropped, admitted to hospital  . Sulfa Antibiotics Nausea And Vomiting and Other (See Comments)    Crazy feeling in head Life threatening reaction, decreased blood counts  . Codeine Nausea And Vomiting  . Codeine Phosphate Nausea And Vomiting  . Eggs Or Egg-Derived Products Hives    History   Social History  . Marital Status: Single    Spouse Name: N/A    Number of Children: 1  . Years of Education: N/A   Occupational History  .      retired Hydrographic surveyor   Social History Main Topics  . Smoking status: Former Smoker -- 1.00 packs/day for 35 years    Types: Cigarettes    Quit date: 07/21/1979  . Smokeless tobacco: Never Used  . Alcohol Use: No  . Drug Use: No  . Sexually Active: Not Currently   Other Topics Concern  . Not on file   Social History Narrative  . No narrative on file    Family History  Problem Relation Age of Onset  . Diabetes Mother   . Heart disease Mother   . Hypertension Mother   . Hyperlipidemia Mother   . Breast cancer Mother 58    lobular breast cancer  . Heart disease Maternal Aunt   . Heart disease Maternal Uncle   . Heart disease Maternal Grandmother     died in her 67s from heart disease  . Lung cancer Father 63    adenocarcinoma  . Cancer Paternal Uncle     dx in mid 52s with a cancer in the leg, died late 35s; grandpaternal half uncle    PHYSICAL EXAM: Filed Vitals:   01/20/13 1042  BP: 150/88  Pulse: 98   General:  Well appearing. No respiratory difficulty HEENT: no Neck: supple. no JVD. Carotids 2+ bilat; no bruits. No lymphadenopathy or thryomegaly appreciated. Cor: PMI nondisplaced. Regular rate & rhythm. No rubs, gallops or murmurs. Lungs: clear Abdomen: soft, nontender, nondistended. No hepatosplenomegaly. No bruits or masses. Good bowel sounds. Extremities: no cyanosis, clubbing, rash, LLE 2+ RLE  1+ Neuro: alert & oriented x 3, cranial nerves grossly intact. moves all 4 extremities w/o difficulty. Affect pleasant.      ASSESSMENT & PLAN:

## 2013-01-31 NOTE — Assessment & Plan Note (Addendum)
Echo  01/20/13 > no RV strain or PAH Coag panel was positive for prothrombin gene mutation-heterozygous. IVC was placed.  Bilateral DVT and PE on CT (while receiving active tx for inflammatory breast cancer)  >tx w/ lovenox and IVC   She has recovered nicely from a pulmonary perspective in terms of symptoms, did not have Right heart strain, and has a filter so should be relatively low risk of exacerbating the clotting problems except below the filter if undergoes surgery off the lovenox  Further w/u and f/u can be through hematology since she'll need to be followed there anyway for the hypercoagulable state.

## 2013-01-31 NOTE — Assessment & Plan Note (Addendum)
VDRF 2/2 PE, DVT  and HCAP 12/30/12  S/p lovenox rx and IVC . , abx and steroids for bilateral infiltrates on CTA. ETT 1/30-2/2  >no desats 01/21/2013 w/ amb walk x3 laps  Plan  Probably won't need any more 02 longterm once gets though the post op period

## 2013-02-04 ENCOUNTER — Encounter: Payer: Self-pay | Admitting: Oncology

## 2013-02-04 ENCOUNTER — Other Ambulatory Visit (HOSPITAL_BASED_OUTPATIENT_CLINIC_OR_DEPARTMENT_OTHER): Payer: Medicare Other | Admitting: Lab

## 2013-02-04 ENCOUNTER — Ambulatory Visit: Payer: Medicare Other | Admitting: Lab

## 2013-02-04 ENCOUNTER — Other Ambulatory Visit: Payer: Medicare Other | Admitting: Lab

## 2013-02-04 ENCOUNTER — Telehealth: Payer: Self-pay | Admitting: Oncology

## 2013-02-04 ENCOUNTER — Ambulatory Visit: Payer: Medicare Other | Admitting: Adult Health

## 2013-02-04 ENCOUNTER — Ambulatory Visit (HOSPITAL_BASED_OUTPATIENT_CLINIC_OR_DEPARTMENT_OTHER): Payer: Medicare Other | Admitting: Oncology

## 2013-02-04 ENCOUNTER — Ambulatory Visit: Payer: Medicare Other

## 2013-02-04 VITALS — BP 153/89 | HR 99 | Temp 98.2°F | Wt 138.8 lb

## 2013-02-04 DIAGNOSIS — C773 Secondary and unspecified malignant neoplasm of axilla and upper limb lymph nodes: Secondary | ICD-10-CM | POA: Diagnosis not present

## 2013-02-04 DIAGNOSIS — C50919 Malignant neoplasm of unspecified site of unspecified female breast: Secondary | ICD-10-CM

## 2013-02-04 LAB — CBC WITH DIFFERENTIAL/PLATELET
Basophils Absolute: 0.1 10*3/uL (ref 0.0–0.1)
Eosinophils Absolute: 0.1 10*3/uL (ref 0.0–0.5)
HCT: 33.6 % — ABNORMAL LOW (ref 34.8–46.6)
HGB: 11.3 g/dL — ABNORMAL LOW (ref 11.6–15.9)
LYMPH%: 23.7 % (ref 14.0–49.7)
MCV: 89.6 fL (ref 79.5–101.0)
MONO%: 9.3 % (ref 0.0–14.0)
NEUT#: 4 10*3/uL (ref 1.5–6.5)
NEUT%: 65.1 % (ref 38.4–76.8)
Platelets: 237 10*3/uL (ref 145–400)
RBC: 3.75 10*6/uL (ref 3.70–5.45)

## 2013-02-04 LAB — COMPREHENSIVE METABOLIC PANEL (CC13)
BUN: 17.7 mg/dL (ref 7.0–26.0)
CO2: 27 mEq/L (ref 22–29)
Calcium: 10.1 mg/dL (ref 8.4–10.4)
Creatinine: 1.1 mg/dL (ref 0.6–1.1)
Glucose: 102 mg/dl — ABNORMAL HIGH (ref 70–99)
Total Bilirubin: 0.39 mg/dL (ref 0.20–1.20)

## 2013-02-04 NOTE — Telephone Encounter (Signed)
No new orders or pof. Per desk nurse pt sent back to lb. Next appt is 3/14. Pt given schedule.

## 2013-02-04 NOTE — Progress Notes (Signed)
[facility]  Telephone:(336) [phone] Fax:(336) [phone]  OFFICE PROGRESS NOTE   PATIENT: Jessica Wells   DOB: [birth year]  MR#: [id number]  ZHY#:[id number]   EX:BMWUXL,KGMWNUUVO STEWART, MD Lurline Hare, MD Emelia Loron, MD Arvilla Meres, MD Charlaine Dalton. Sherene Sires, MD    DIAGNOSIS:  67 year-old [place], [place] woman with a recent diagnosis of inflammatory breast cancer.   PRIOR THERAPY: 1.  The patient was originally seen in clinic for new diagnosis of inflammatory breast cancer, she was referred by Dr. Emelia Loron. She had a mammogram performed on 07/14/2012 that showed an abnormality. That was biopsied and it showed an invasive mammary carcinoma with lymphovascular invasion grade 3 ER negative PR negative HER-2/neu positive.   2.  The patient had a MRI of the breasts performed on 07/19/2012.  The MRI showed diffuse right breast neoplasm on a large level I right axillary lymph nodes compatible with lymphatic spread.  The patient has had a biopsy of the right axillary lymph node that is compatible with invasive mammary carcinoma.   3.  The patient began neoadjuvant FEC 100 with day 2 Neulasta support on 07/23/2012.   4.   Weekly neoadjuvant Taxol and Herceptin started on 09/17/12.  The patient developed neuropathies, and Taxol was discontinued early.  She completed 7 weeks of Taxol/Herceptin combination therapy.  The patient received 2 weeks of Herceptin only.   5. Weekly Gemcitabine and Herceptin was started on 11/19/12 and completed on 12/17/2012 after 5 cycles.  6.  A regimen of Lasix 20 mg PO and Kdur 10 mEq was started on 01/07/2013 due to chronic lower extremity edema.  Medications are only to be taken if patient has a weight gain of 5 lbs or more in a day (daily weights).   7.  Anemia was treated with blood transfusions of 1 unit of PRBCs given on 01/11/2013 and another unit of PRBC's given 01/12/2013.   CURRENT THERAPY:  Herceptin Qweekly x 12  weeks started on 01/14/2013.   INTERVAL HISTORY: Ms. Roesler presents today for her weekly Herceptin infusion.  She was last seen by [place] on 01/21/2013.  Since her last office visit, the patient sates that she continues to do well.  Ms. Ewing has not required supplemental oxygen, she has a noticeable increase in her endurance and she states that her leg swelling has improved.  She states that she is scheduled for right modified radical mastectomy surgery with Dr. Dwain Sarna on 02/08/2013 and was seen by him earlier this week.  The patient's biggest concern is concerning the cost of the Lovenox injections she is taking.  She states the cost has increased tremendously for her and she cannot afford this medication.     PAST MEDICAL HISTORY: Past Medical History  Diagnosis Date  . GERD (gastroesophageal reflux disease)   . Dysrhythmia     PAT-sees dr Laurence Compton meds  . Breast cancer 07/14/12    inflammatory right breast ca, ER/PR -  . Allergy   . PAT (paroxysmal atrial tachycardia)     hx  . Arthritis     osteopenia,knees  . PONV (postoperative nausea and vomiting) 09-13-12    severe, with Port-a-cath, was managed without PONV  . Clotting disorder     prothrombin gene mutation-heterozygous   . History of chemotherapy     Last dose to be 02-04-13.  Was rx'd with Herceptin & Gemzar  . DVT (deep venous thrombosis)   . Asthma     Uses Inhalers Proventil as needed  .  Bursitis 01-25-13    Rt. shoulder- mildly affected now.  . History of blood transfusion 01-25-13    2 units 01-11-13  . Pneumonia 01-25-13    hx. 12-27-12-hospital stay x 9 days, now resolved.    PAST SURGICAL HISTORY: Past Surgical History  Procedure Laterality Date  . Dilation and curettage of uterus      x3  . Tonsillectomy    . Colonoscopy    . Portacath placement  07/21/2012    Procedure: INSERTION PORT-A-CATH;  Surgeon: Emelia Loron, MD;  Location: [facility];  Service: General;  Laterality: Left;/  Replacement done 10'13  . Insertion of vena cava filter  12/2012  . Giant cell tumor  01-25-13    removed Rt. forearm.  . Eye lid surgery      right( MD office)    FAMILY HISTORY: Family History  Problem Relation Age of Onset  . Diabetes Mother   . Heart disease Mother   . Hypertension Mother   . Hyperlipidemia Mother   . Breast cancer Mother 64    lobular breast cancer  . Heart disease Maternal Aunt   . Heart disease Maternal Uncle   . Heart disease Maternal Grandmother     died in her 65s from heart disease  . Lung cancer Father 15    adenocarcinoma  . Cancer Paternal Uncle     dx in mid 69s with a cancer in the leg, died late 71s; grandpaternal half uncle    SOCIAL HISTORY: History  Substance Use Topics  . Smoking status: Former Smoker -- 1.00 packs/day for 35 years    Types: Cigarettes    Quit date: 07/21/1979  . Smokeless tobacco: Never Used  . Alcohol Use: No    ALLERGIES: Allergies  Allergen Reactions  . Anesthetics, Amide Nausea And Vomiting    Projectile vomitting-Nausea- with 24hrs of dry heaves. With any anesthetics  . Bactrim (Sulfamethoxazole W-Trimethoprim) Other (See Comments)    Massive diarrhea, nausea vomiting, platelets dropped, hemoglobin dropped, admitted to hospital  . Sulfa Antibiotics Nausea And Vomiting and Other (See Comments)    Crazy feeling in head Life threatening reaction, decreased blood counts  . Codeine Nausea And Vomiting  . Codeine Phosphate Nausea And Vomiting  . Eggs Or Egg-Derived Products Hives     MEDICATIONS:  Current Outpatient Prescriptions  Medication Sig Dispense Refill  . enoxaparin (LOVENOX) 80 MG/0.8ML injection Inject 0.7 mLs (70 mg total) into the skin every 12 (twelve) hours.  60 Syringe  3  . lidocaine-prilocaine (EMLA) cream Apply topically as needed.  30 g  5  . metoprolol succinate (TOPROL-XL) 50 MG 24 hr tablet Take 50 mg by mouth daily before breakfast. Take with or immediately following a meal.      .  omeprazole (PRILOSEC) 20 MG capsule Take 20 mg by mouth daily as needed. For heartburn      . ondansetron (ZOFRAN) 8 MG tablet Take 8 mg by mouth every 8 (eight) hours as needed for nausea.       Marland Kitchen b complex vitamins tablet Take 1 tablet by mouth daily.      . calcium-vitamin D (OSCAL WITH D) 500-200 MG-UNIT per tablet Take 1 tablet by mouth every other day.       . fish oil-omega-3 fatty acids 1000 MG capsule Take 2 g by mouth daily.      . Flaxseed, Linseed, (FLAX SEEDS PO) Take 1 tablet by mouth daily.      . furosemide (  LASIX) 20 MG tablet Take 20 mg by mouth as needed (If you gain more than 5 pounds in one day, take one pill. ).      Marland Kitchen glucosamine-chondroitin 500-400 MG tablet Take 1 tablet by mouth daily.      . Multiple Vitamin (MULTIVITAMIN WITH MINERALS) TABS Take 1 tablet by mouth every other day.       . potassium chloride (K-DUR,KLOR-CON) 10 MEQ tablet Take 10 mEq by mouth as needed (If you take Lasix, take the potassium pill).      Marland Kitchen PROVENTIL HFA 108 (90 BASE) MCG/ACT inhaler        No current facility-administered medications for this visit.   Facility-Administered Medications Ordered in Other Visits  Medication Dose Route Frequency Provider Last Rate Last Dose  . sodium chloride 0.9 % injection 10 mL  10 mL Intracatheter PRN Keitha Butte, NP   10 mL at 01/21/13 1634      REVIEW OF SYSTEMS: A 10 point review of systems was completed and is negative except as noted above.    PHYSICAL EXAMINATION: BP 153/89  Pulse 99  Temp(Src) 98.2 F (36.8 C) (Oral)  Wt 138 lb 12.8 oz (62.959 kg)  BMI 26.24 kg/m2     General appearance: Alert, cooperative, well nourished, no apparent distress Head:  Atraumatic, normocephalic Eyes: Conjunctivae/corneas clear, PERRLA, EOMI Nose: No drainage or sinus tenderness Neck: No adenopathy, supple, symmetrical, trachea midline, thyroid not enlarged, no tenderness Resp: Clear to auscultation bilaterally, diminished bibasilar breath  sounds Cardio: Regular rate and rhythm, S1, S2 normal, no murmur, click, rub or gallop, left chest Port-A-Cath covered with EMLA cream Breasts: Not examined GI: Soft, distended, non-tender, hypoactive bowel sounds, no organomegaly Extremities: Extremities normal, atraumatic, no cyanosis, LE edema, LLE (+2)  is larger than RLE (trace edema) Lymph nodes: Cervical, supraclavicular, and axillary nodes normal Neurologic: Grossly normal    ECOG FS:  Grade 2 - Symptomatic, but completely ambulatory  LAB RESULTS: Lab Results  Component Value Date   WBC 6.1 02/04/2013   NEUTROABS 4.0 02/04/2013   HGB 11.3* 02/04/2013   HCT 33.6* 02/04/2013   MCV 89.6 02/04/2013   PLT 237 02/04/2013      Chemistry      Component Value Date/Time   NA 138 01/28/2013 1630   NA 140 01/21/2013 1341   K 3.9 01/28/2013 1630   K 3.9 01/21/2013 1341   CL 102 01/28/2013 1630   CL 103 01/21/2013 1341   CO2 29 01/28/2013 1630   CO2 28 01/21/2013 1341   BUN 13 01/28/2013 1630   BUN 13.7 01/21/2013 1341   CREATININE 0.94 01/28/2013 1630   CREATININE 1.0 01/21/2013 1341      Component Value Date/Time   CALCIUM 9.0 01/28/2013 1630   CALCIUM 9.5 01/21/2013 1341   ALKPHOS 66 01/28/2013 1630   ALKPHOS 76 01/21/2013 1341   AST 36 01/28/2013 1630   AST 30 01/21/2013 1341   ALT 55* 01/28/2013 1630   ALT 52 01/21/2013 1341   BILITOT 0.2* 01/28/2013 1630   BILITOT 0.30 01/21/2013 1341      Lab Results  Component Value Date   LABCA2 16 07/19/2012     RADIOGRAPHIC STUDIES: No results found.  ASSESSMENT: 67 y.o. [place], [place] woman with:  1.  Inflammatory right breast cancer with positive lymph nodes the tumor is ER negative PR negative HER-2/neu positive.  Patient is s/p neoadjuvant chemotherapy consisting of FEC q 2 weeks x 4 cycles.  Status post weekly  Gemcitabine and Herceptin that was started on 11/19/12 and completed on 12/17/2012 after 5 cycles.   Herceptin Qweekly x 12 weeks started on 01/14/2013 and she will receive a Herceptin  infusion today.  Dr. Dwain Sarna is scheduled to perform a right breast radical mastectomy on 02/08/2013.  There are plans for radiation therapy with Dr. Michell Heinrich following surgery.     2.  Bilateral DVT and Bilateral PE  3.  Lower extremity edema   PLAN:  1.  Bilateral DVT and PE.  The patient has a positive Prothrombin Gene Mutation, heterozygous. S/p IVC filter inserted on  12/27/2012. The patient to continue full dose Lovenox.  She will continue full dose Lovenox until 24 hours before her surgery.    2.  She will proceed with her surgery. We had planned on giving her Herceptin today but we will not do that today since it is so close to her surgery. She'll be seen back after her surgery.  All questions were answered.  The patient was encouraged to contact us in the interim with any problems, questions or concerns. The length of time of the face-to-face encounter was 30  minutes. More than 50% of time was spent counseling and coordination of care.   Drue Second, MD Medical/Oncology [facility] [phone] (beeper) [phone] (Office)  02/04/2013, 1:27 PM

## 2013-02-08 ENCOUNTER — Inpatient Hospital Stay (HOSPITAL_COMMUNITY): Payer: Medicare Other | Admitting: Certified Registered Nurse Anesthetist

## 2013-02-08 ENCOUNTER — Encounter (HOSPITAL_COMMUNITY): Payer: Self-pay | Admitting: Certified Registered Nurse Anesthetist

## 2013-02-08 ENCOUNTER — Encounter (HOSPITAL_COMMUNITY)
Admission: RE | Disposition: A | Discharge status: Home / Self Care | Payer: Self-pay | Source: Ambulatory Visit | Attending: General Surgery

## 2013-02-08 ENCOUNTER — Encounter (HOSPITAL_COMMUNITY): Payer: Self-pay | Admitting: *Deleted

## 2013-02-08 ENCOUNTER — Inpatient Hospital Stay (HOSPITAL_COMMUNITY)
Admission: RE | Admit: 2013-02-08 | Discharge: 2013-02-11 | DRG: 583 | Disposition: A | Discharge status: Home / Self Care | Payer: Medicare Other | Source: Ambulatory Visit | Attending: General Surgery | Admitting: General Surgery

## 2013-02-08 DIAGNOSIS — Z86718 Personal history of other venous thrombosis and embolism: Secondary | ICD-10-CM

## 2013-02-08 DIAGNOSIS — K219 Gastro-esophageal reflux disease without esophagitis: Secondary | ICD-10-CM | POA: Diagnosis present

## 2013-02-08 DIAGNOSIS — Z79899 Other long term (current) drug therapy: Secondary | ICD-10-CM

## 2013-02-08 DIAGNOSIS — J45909 Unspecified asthma, uncomplicated: Secondary | ICD-10-CM | POA: Diagnosis present

## 2013-02-08 DIAGNOSIS — C50919 Malignant neoplasm of unspecified site of unspecified female breast: Secondary | ICD-10-CM

## 2013-02-08 DIAGNOSIS — Z9221 Personal history of antineoplastic chemotherapy: Secondary | ICD-10-CM

## 2013-02-08 HISTORY — PX: MASTECTOMY MODIFIED RADICAL: SHX5962

## 2013-02-08 LAB — PROTIME-INR
INR: 0.96 (ref 0.00–1.49)
Prothrombin Time: 12.7 seconds (ref 11.6–15.2)

## 2013-02-08 SURGERY — MASTECTOMY, MODIFIED RADICAL
Anesthesia: General | Site: Breast | Laterality: Right | Wound class: Clean

## 2013-02-08 MED ORDER — ALBUTEROL SULFATE HFA 108 (90 BASE) MCG/ACT IN AERS
INHALATION_SPRAY | RESPIRATORY_TRACT | Status: DC | PRN
  Administered 2013-02-08: 5 via RESPIRATORY_TRACT

## 2013-02-08 MED ORDER — METOPROLOL SUCCINATE ER 50 MG PO TB24
50.0000 mg | ORAL_TABLET | Freq: Every day | ORAL | Status: DC
  Administered 2013-02-09 – 2013-02-11 (×3): 50 mg via ORAL
  Filled 2013-02-08 (×4): qty 1

## 2013-02-08 MED ORDER — FENTANYL CITRATE 0.05 MG/ML IJ SOLN
INTRAMUSCULAR | Status: DC | PRN
  Administered 2013-02-08: 50 ug via INTRAVENOUS
  Administered 2013-02-08: 100 ug via INTRAVENOUS
  Administered 2013-02-08 (×2): 50 ug via INTRAVENOUS

## 2013-02-08 MED ORDER — DEXAMETHASONE SODIUM PHOSPHATE 10 MG/ML IJ SOLN
INTRAMUSCULAR | Status: DC | PRN
  Administered 2013-02-08: 10 mg via INTRAVENOUS

## 2013-02-08 MED ORDER — LIDOCAINE HCL (CARDIAC) 20 MG/ML IV SOLN
INTRAVENOUS | Status: DC | PRN
  Administered 2013-02-08: 100 mg via INTRAVENOUS

## 2013-02-08 MED ORDER — PROPOFOL 10 MG/ML IV BOLUS
INTRAVENOUS | Status: DC | PRN
  Administered 2013-02-08: 50 mg via INTRAVENOUS
  Administered 2013-02-08: 150 mg via INTRAVENOUS

## 2013-02-08 MED ORDER — SODIUM CHLORIDE 0.9 % IV SOLN
INTRAVENOUS | Status: DC
  Administered 2013-02-08 – 2013-02-09 (×2): via INTRAVENOUS

## 2013-02-08 MED ORDER — SUCCINYLCHOLINE CHLORIDE 20 MG/ML IJ SOLN
INTRAMUSCULAR | Status: DC | PRN
  Administered 2013-02-08: 100 mg via INTRAVENOUS

## 2013-02-08 MED ORDER — SCOPOLAMINE 1 MG/3DAYS TD PT72
MEDICATED_PATCH | TRANSDERMAL | Status: DC | PRN
  Administered 2013-02-08: 1 via TRANSDERMAL

## 2013-02-08 MED ORDER — LACTATED RINGERS IV SOLN
INTRAVENOUS | Status: DC
  Administered 2013-02-08: 1000 mL via INTRAVENOUS

## 2013-02-08 MED ORDER — PANTOPRAZOLE SODIUM 40 MG PO TBEC
40.0000 mg | DELAYED_RELEASE_TABLET | Freq: Every day | ORAL | Status: DC
  Administered 2013-02-09 – 2013-02-10 (×2): 40 mg via ORAL
  Filled 2013-02-08 (×4): qty 1

## 2013-02-08 MED ORDER — CEFAZOLIN SODIUM 1-5 GM-% IV SOLN
1.0000 g | Freq: Three times a day (TID) | INTRAVENOUS | Status: AC
  Administered 2013-02-08 – 2013-02-09 (×3): 1 g via INTRAVENOUS
  Filled 2013-02-08 (×3): qty 50

## 2013-02-08 MED ORDER — 0.9 % SODIUM CHLORIDE (POUR BTL) OPTIME
TOPICAL | Status: DC | PRN
  Administered 2013-02-08 (×2): 1000 mL

## 2013-02-08 MED ORDER — BACITRACIN ZINC 500 UNIT/GM EX OINT
TOPICAL_OINTMENT | CUTANEOUS | Status: AC
  Filled 2013-02-08: qty 15

## 2013-02-08 MED ORDER — LACTATED RINGERS IV SOLN: INTRAVENOUS | Status: DC

## 2013-02-08 MED ORDER — METOCLOPRAMIDE HCL 5 MG/ML IJ SOLN
INTRAMUSCULAR | Status: DC | PRN
  Administered 2013-02-08: 10 mg via INTRAVENOUS

## 2013-02-08 MED ORDER — SCOPOLAMINE 1 MG/3DAYS TD PT72
MEDICATED_PATCH | TRANSDERMAL | Status: AC
  Filled 2013-02-08: qty 1

## 2013-02-08 MED ORDER — ACETAMINOPHEN 325 MG PO TABS: 650.0000 mg | ORAL_TABLET | Freq: Four times a day (QID) | ORAL | Status: DC | PRN

## 2013-02-08 MED ORDER — ONDANSETRON HCL 4 MG/2ML IJ SOLN: 4.0000 mg | Freq: Four times a day (QID) | INTRAMUSCULAR | Status: DC | PRN

## 2013-02-08 MED ORDER — ALBUTEROL SULFATE HFA 108 (90 BASE) MCG/ACT IN AERS: 2.0000 | INHALATION_SPRAY | Freq: Four times a day (QID) | RESPIRATORY_TRACT | Status: DC | PRN

## 2013-02-08 MED ORDER — ONDANSETRON HCL 4 MG/2ML IJ SOLN
INTRAMUSCULAR | Status: DC | PRN
  Administered 2013-02-08: 4 mg via INTRAVENOUS

## 2013-02-08 MED ORDER — EPHEDRINE SULFATE 50 MG/ML IJ SOLN
INTRAMUSCULAR | Status: DC | PRN
  Administered 2013-02-08: 10 mg via INTRAVENOUS
  Administered 2013-02-08: 5 mg via INTRAVENOUS

## 2013-02-08 MED ORDER — ALBUTEROL SULFATE HFA 108 (90 BASE) MCG/ACT IN AERS
INHALATION_SPRAY | RESPIRATORY_TRACT | Status: AC
  Filled 2013-02-08: qty 6.7

## 2013-02-08 MED ORDER — HYDROMORPHONE HCL PF 1 MG/ML IJ SOLN
INTRAMUSCULAR | Status: AC
  Filled 2013-02-08: qty 1

## 2013-02-08 MED ORDER — CEFAZOLIN SODIUM-DEXTROSE 2-3 GM-% IV SOLR
2.0000 g | INTRAVENOUS | Status: AC
  Administered 2013-02-08: 2 g via INTRAVENOUS

## 2013-02-08 MED ORDER — OXYCODONE HCL 5 MG PO TABS
5.0000 mg | ORAL_TABLET | ORAL | Status: DC | PRN
  Administered 2013-02-08 – 2013-02-09 (×2): 5 mg via ORAL
  Filled 2013-02-08 (×2): qty 1

## 2013-02-08 MED ORDER — ACETAMINOPHEN 650 MG RE SUPP: 650.0000 mg | Freq: Four times a day (QID) | RECTAL | Status: DC | PRN

## 2013-02-08 MED ORDER — MORPHINE SULFATE 2 MG/ML IJ SOLN
2.0000 mg | INTRAMUSCULAR | Status: DC | PRN
  Administered 2013-02-09: 2 mg via INTRAVENOUS
  Filled 2013-02-08: qty 1

## 2013-02-08 MED ORDER — MIDAZOLAM HCL 5 MG/5ML IJ SOLN
INTRAMUSCULAR | Status: DC | PRN
  Administered 2013-02-08: 2 mg via INTRAVENOUS

## 2013-02-08 MED ORDER — HYDROMORPHONE HCL PF 1 MG/ML IJ SOLN
0.2500 mg | INTRAMUSCULAR | Status: DC | PRN
  Administered 2013-02-08 (×2): 0.5 mg via INTRAVENOUS
  Administered 2013-02-08: 0.25 mg via INTRAVENOUS
  Administered 2013-02-08: 0.5 mg via INTRAVENOUS

## 2013-02-08 SURGICAL SUPPLY — 50 items
APPLIER CLIP 11 MED OPEN (CLIP) ×2
BENZOIN TINCTURE PRP APPL 2/3 (GAUZE/BANDAGES/DRESSINGS) IMPLANT
BINDER BREAST LRG (GAUZE/BANDAGES/DRESSINGS) IMPLANT
BINDER BREAST XLRG (GAUZE/BANDAGES/DRESSINGS) ×2 IMPLANT
BLADE HEX COATED 2.75 (ELECTRODE) ×2 IMPLANT
BNDG COHESIVE 4X5 TAN STRL (GAUZE/BANDAGES/DRESSINGS) IMPLANT
CANISTER SUCTION 2500CC (MISCELLANEOUS) ×2 IMPLANT
CHLORAPREP W/TINT 26ML (MISCELLANEOUS) ×2 IMPLANT
CLIP APPLIE 11 MED OPEN (CLIP) ×1 IMPLANT
CLOTH BEACON ORANGE TIMEOUT ST (SAFETY) ×2 IMPLANT
COVER MAYO STAND STRL (DRAPES) ×2 IMPLANT
COVER SURGICAL LIGHT HANDLE (MISCELLANEOUS) ×4 IMPLANT
DERMABOND ADVANCED (GAUZE/BANDAGES/DRESSINGS) ×1
DERMABOND ADVANCED .7 DNX12 (GAUZE/BANDAGES/DRESSINGS) ×1 IMPLANT
DRAIN CHANNEL RND F F (WOUND CARE) ×4 IMPLANT
DRAPE LAPAROSCOPIC ABDOMINAL (DRAPES) ×2 IMPLANT
DRAPE LG THREE QUARTER DISP (DRAPES) IMPLANT
DRAPE ORTHO SPLIT 77X108 STRL (DRAPES) ×2
DRAPE SURG ORHT 6 SPLT 77X108 (DRAPES) ×2 IMPLANT
DRSG PAD ABDOMINAL 8X10 ST (GAUZE/BANDAGES/DRESSINGS) ×2 IMPLANT
ELECT REM PT RETURN 9FT ADLT (ELECTROSURGICAL) ×2
ELECTRODE REM PT RTRN 9FT ADLT (ELECTROSURGICAL) ×1 IMPLANT
EVACUATOR SILICONE 100CC (DRAIN) ×4 IMPLANT
GLOVE BIO SURGEON STRL SZ7 (GLOVE) ×2 IMPLANT
GLOVE BIOGEL PI IND STRL 7.5 (GLOVE) ×1 IMPLANT
GLOVE BIOGEL PI INDICATOR 7.5 (GLOVE) ×1
GOWN PREVENTION PLUS LG XLONG (DISPOSABLE) ×2 IMPLANT
GOWN PREVENTION PLUS XLARGE (GOWN DISPOSABLE) ×2 IMPLANT
GOWN STRL NON-REIN LRG LVL3 (GOWN DISPOSABLE) ×2 IMPLANT
GOWN STRL REIN XL XLG (GOWN DISPOSABLE) ×2 IMPLANT
KIT BASIN OR (CUSTOM PROCEDURE TRAY) ×2 IMPLANT
MARKER SKIN DUAL TIP RULER LAB (MISCELLANEOUS) ×2 IMPLANT
NS IRRIG 1000ML POUR BTL (IV SOLUTION) ×2 IMPLANT
PACK GENERAL/GYN (CUSTOM PROCEDURE TRAY) ×4 IMPLANT
SPONGE DRAIN TRACH 4X4 STRL 2S (GAUZE/BANDAGES/DRESSINGS) ×4 IMPLANT
SPONGE GAUZE 4X4 12PLY (GAUZE/BANDAGES/DRESSINGS) IMPLANT
SPONGE LAP 18X18 X RAY DECT (DISPOSABLE) ×4 IMPLANT
STAPLER VISISTAT 35W (STAPLE) ×2 IMPLANT
STOCKINETTE 6  STRL (DRAPES)
STOCKINETTE 6 STRL (DRAPES) IMPLANT
STOCKINETTE 8 INCH (MISCELLANEOUS) ×2 IMPLANT
STRIP CLOSURE SKIN 1/2X4 (GAUZE/BANDAGES/DRESSINGS) ×4 IMPLANT
SUT ETHILON 2 0 PS N (SUTURE) ×4 IMPLANT
SUT MNCRL AB 4-0 PS2 18 (SUTURE) ×2 IMPLANT
SUT MON AB 5-0 PS2 18 (SUTURE) ×4 IMPLANT
SUT VIC AB 2-0 BRD 54 (SUTURE) IMPLANT
SUT VIC AB 3-0 SH 8-18 (SUTURE) ×6 IMPLANT
TAPE CLOTH SURG 4X10 WHT LF (GAUZE/BANDAGES/DRESSINGS) ×2 IMPLANT
TOWEL OR 17X26 10 PK STRL BLUE (TOWEL DISPOSABLE) ×4 IMPLANT
TRAY FOLEY CATH 14FRSI W/METER (CATHETERS) IMPLANT

## 2013-02-08 NOTE — Anesthesia Postprocedure Evaluation (Signed)
  Anesthesia Post-op Note  Patient: Jessica Wells  Procedure(s) Performed: Procedure(s) (LRB): MASTECTOMY MODIFIED RADICAL (Right)  Patient Location: PACU  Anesthesia Type: General  Level of Consciousness: awake and alert   Airway and Oxygen Therapy: Patient Spontanous Breathing  Post-op Pain: mild  Post-op Assessment: Post-op Vital signs reviewed, Patient's Cardiovascular Status Stable, Respiratory Function Stable, Patent Airway and No signs of Nausea or vomiting  Last Vitals:  Filed Vitals:   02/08/13 1400  BP: 96/75  Pulse: 87  Temp:   Resp: 22    Post-op Vital Signs: stable   Complications: No apparent anesthesia complications

## 2013-02-08 NOTE — Transfer of Care (Signed)
Immediate Anesthesia Transfer of Care Note  Patient: Jessica Wells  Procedure(s) Performed: Procedure(s) (LRB): MASTECTOMY MODIFIED RADICAL (Right)  Patient Location: PACU  Anesthesia Type: General  Level of Consciousness: sedated, patient cooperative and responds to stimulaton  Airway & Oxygen Therapy: Patient Spontanous Breathing and Patient connected to face mask oxgen  Post-op Assessment: Report given to PACU RN and Post -op Vital signs reviewed and stable  Post vital signs: Reviewed and stable  Complications: No apparent anesthesia complications

## 2013-02-08 NOTE — Interval H&P Note (Signed)
History and Physical Interval Note:  02/08/2013 10:40 AM  [name]  has presented today for surgery, with the diagnosis of right breast cancer  The various methods of treatment have been discussed with the patient and family. After consideration of risks, benefits and other options for treatment, the patient has consented to  Procedure(s): MASTECTOMY MODIFIED RADICAL (Right) as a surgical intervention .  The patient's history has been reviewed, patient examined, no change in status, stable for surgery.  I have reviewed the patient's chart and labs.  Questions were answered to the patient's satisfaction.     WAKEFIELD,MATTHEW

## 2013-02-08 NOTE — Op Note (Signed)
Preoperative diagnosis: Right inflammatory breast cancer Postoperative diagnosis: Same as above Procedure: Right modified radical mastectomy Surgeon: Dr. Harden Mo Anesthesia: Gen. Endotracheal Estimated blood loss: 50 cc Drains: 2 19 French Blake drains to space Specimens: Right breast and axillary contents marked short stitch superior, lateral stitch marks axillary contents Complications: None Sponge and needle count correct at end of operation Disposition to recovery in stable condition  Indications: This is a 67 year old female who I saw previously and diagnosed her with inflammatory breast cancer on the right side. This is HER-2/neu-positive. She is undergone primary systemic therapy with an MRI showing a complete response. Due to the fact she has had inflammatory breast cancer upon presentation with nodal disease she and I discussed a modified radical mastectomy. She has recently also had a very significant hospitalization she is fully recovered from. She has had a pulmonary embolus and is on full dose anticoagulation.  Procedure: After informed consent was obtained the patient was taken to the operating room. She was administered 2 g of intravenous cefazolin. She was then placed under general endotracheal anesthesia. A glide scope was used as she was somewhat of a difficult intubation. She was then prepped and draped in the standard sterile surgical fashion. A surgical timeout was performed.  I made an elliptical incision surrounding the nipple areolar complex. I then created flaps to the clavicle superiorly, sternum medially, inframammary crease inferiorly, and latissimus laterally. This was a difficult part the operation and there was a lot of scarring to her pectoralis muscle. Eventually I removed the breast as well as the fascia of the pectoralis muscle. I rolled this laterally and entered into the axilla. I identified the axillary vein. I also identified the thoracodorsal bundle and  the long thoracic nerve. Both of these nerves were functional upon completion. I then swept the lymphatic tissue caudad from the axillary vein. I then removed completely as an entire bundle. Hemostasis was then obtained. I put a 2-0 Vicryl stitches in small areas that were bleeding. Irrigation was performed. I then inserted 2 19 [place] Blake drains secured these with 2-0 nylon. I then closed this with 3-0 Vicryl and 4 Monocryl. Dermabond and Steri-Strips are placed over the wound. Gauze as well as a breast binder were placed. She tolerated this well was extubated and transferred to recovery. I would like to wait until tomorrow morning to restart her anticoagulation to make sure she does not bleeding.

## 2013-02-08 NOTE — Anesthesia Preprocedure Evaluation (Addendum)
Anesthesia Evaluation  Patient identified by MRN, date of birth, ID band Patient awake    Reviewed: Allergy & Precautions, H&P , NPO status , Patient's Chart, lab work & pertinent test results, reviewed documented beta blocker date and time   History of Anesthesia Complications (+) PONV  Airway Mallampati: II TM Distance: >3 FB Neck ROM: full    Dental no notable dental hx. (+) Teeth Intact and Dental Advisory Given   Pulmonary asthma ,  History DVT and PE.  History pulmonary infiltrates breath sounds clear to auscultation  Pulmonary exam normal       Cardiovascular Exercise Tolerance: Good negative cardio ROS  Rhythm:regular Rate:Normal  Paroxysmal atrial tachycardia   Neuro/Psych negative neurological ROS  negative psych ROS   GI/Hepatic negative GI ROS, Neg liver ROS, GERD-  Medicated and Controlled,  Endo/Other  negative endocrine ROS  Renal/GU negative Renal ROS  negative genitourinary   Musculoskeletal   Abdominal   Peds  Hematology negative hematology ROS (+)   Anesthesia Other Findings   Reproductive/Obstetrics negative OB ROS                          Anesthesia Physical Anesthesia Plan  ASA: III  Anesthesia Plan: General   Post-op Pain Management:    Induction: Intravenous  Airway Management Planned: Oral ETT  Additional Equipment:   Intra-op Plan:   Post-operative Plan: Extubation in OR  Informed Consent: I have reviewed the patients History and Physical, chart, labs and discussed the procedure including the risks, benefits and alternatives for the proposed anesthesia with the patient or authorized representative who has indicated his/her understanding and acceptance.   Dental Advisory Given  Plan Discussed with: CRNA and Surgeon  Anesthesia Plan Comments:         Anesthesia Quick Evaluation

## 2013-02-08 NOTE — Preoperative (Signed)
Beta Blockers   Reason not to administer Beta Blockers:Not Applicable Pt took Beta Blocker 02-08-13 AM

## 2013-02-08 NOTE — H&P (View-Only) (Signed)
Patient ID: Jessica Wells, female   DOB: 08/03/1946, 67 y.o.   MRN: [id number]  Chief Complaint  Patient presents with  . Routine Post Op    2 wk reck br ca    HPI Jessica Wells is a 67 y.o. female.   HPI 79 yof who had right breast inflammatory cancer diagnosed then underwent chemo with good response.  Then she was admitted to hospital for pna and dvt/pe.  She was discharged on 2/5.  She states she is back to about 90% of her self right now. She doesn't really have sob. She does have some minimal rle swelling and the lle edema is improving.  She comes in today to see when we will do her surgery.  Past Medical History  Diagnosis Date  . Asthma   . GERD (gastroesophageal reflux disease)   . Dysrhythmia     PAT-sees dr Laurence Compton meds  . Breast cancer 07/14/12    inflammatory right breast ca, ER/PR -  . Allergy   . PAT (paroxysmal atrial tachycardia)     hx  . Arthritis     osteopenia,knees  . PONV (postoperative nausea and vomiting) 09-13-12    severe, with Port-a-cath, was managed without PONV  . Clotting disorder     prothrombin gene mutation-heterozygous   . History of chemotherapy     Last dose 12/17/12.  Was rx'd with Herceptin & Gemzar  . DVT (deep venous thrombosis)     Past Surgical History  Procedure Laterality Date  . Dilation and curettage of uterus      x3  . Tonsillectomy    . Colonoscopy    . Portacath placement  07/21/2012    Procedure: INSERTION PORT-A-CATH;  Surgeon: Emelia Loron, MD;  Location: [facility];  Service: General;  Laterality: Left;  . Insertion of vena cava filter  12/2012    Family History  Problem Relation Age of Onset  . Diabetes Mother   . Heart disease Mother   . Hypertension Mother   . Hyperlipidemia Mother   . Breast cancer Mother 70    lobular breast cancer  . Heart disease Maternal Aunt   . Heart disease Maternal Uncle   . Heart disease Maternal Grandmother     died in her 73s from heart disease  . Lung cancer  Father 21    adenocarcinoma  . Cancer Paternal Uncle     dx in mid 25s with a cancer in the leg, died late 1s; grandpaternal half uncle    Social History History  Substance Use Topics  . Smoking status: Former Smoker -- 1.00 packs/day for 35 years    Types: Cigarettes    Quit date: 07/21/1979  . Smokeless tobacco: Never Used  . Alcohol Use: No    Allergies  Allergen Reactions  . Anesthetics, Amide Nausea And Vomiting    Projectile vomitting-Nausea- with 24hrs of dry heaves. With any anesthetics  . Bactrim (Sulfamethoxazole W-Trimethoprim) Other (See Comments)    Massive diarrhea, nausea vomiting, platelets dropped, hemoglobin dropped, admitted to hospital  . Sulfa Antibiotics Nausea And Vomiting and Other (See Comments)    Crazy feeling in head Life threatening reaction, decreased blood counts  . Codeine Nausea And Vomiting  . Codeine Phosphate Nausea And Vomiting  . Eggs Or Egg-Derived Products Hives    Current Outpatient Prescriptions  Medication Sig Dispense Refill  . b complex vitamins tablet Take 1 tablet by mouth daily.      . calcium-vitamin D (OSCAL WITH  D) 500-200 MG-UNIT per tablet Take 1 tablet by mouth every other day.       . enoxaparin (LOVENOX) 80 MG/0.8ML injection Inject 0.7 mLs (70 mg total) into the skin every 12 (twelve) hours.  60 Syringe  3  . fish oil-omega-3 fatty acids 1000 MG capsule Take 2 g by mouth daily.      . Flaxseed, Linseed, (FLAX SEEDS PO) Take 1 tablet by mouth daily.      Marland Kitchen glucosamine-chondroitin 500-400 MG tablet Take 1 tablet by mouth daily.      . metoprolol succinate (TOPROL-XL) 50 MG 24 hr tablet Take 50 mg by mouth daily before breakfast. Take with or immediately following a meal.      . Multiple Vitamin (MULTIVITAMIN WITH MINERALS) TABS Take 1 tablet by mouth every other day.       Marland Kitchen omeprazole (PRILOSEC) 20 MG capsule Take 20 mg by mouth daily as needed. For heartburn      . ondansetron (ZOFRAN) 8 MG tablet Take 8 mg by mouth  every 8 (eight) hours as needed for nausea.       . furosemide (LASIX) 20 MG tablet Take 20 mg by mouth as needed (If you gain more than 5 pounds in one day, take one pill. ).      Marland Kitchen lidocaine-prilocaine (EMLA) cream Apply topically as needed.  30 g  5  . potassium chloride (K-DUR,KLOR-CON) 10 MEQ tablet Take 10 mEq by mouth as needed (If you take Lasix, take the potassium pill).      Marland Kitchen PROVENTIL HFA 108 (90 BASE) MCG/ACT inhaler        No current facility-administered medications for this visit.   Facility-Administered Medications Ordered in Other Visits  Medication Dose Route Frequency Provider Last Rate Last Dose  . sodium chloride 0.9 % injection 10 mL  10 mL Intracatheter PRN Keitha Butte, NP   10 mL at 01/21/13 1634    Review of Systems Review of Systems  Respiratory: Negative for cough and shortness of breath.   Cardiovascular: Negative for chest pain.  Gastrointestinal: Negative for abdominal pain and abdominal distention.    Blood pressure 122/82, pulse 107, temperature 98.2 F (36.8 C), temperature source Temporal, resp. rate 18, height 5\' 1"  (1.549 m), weight 139 lb 3.2 oz (63.141 kg).  Physical Exam Physical Exam  Vitals reviewed. Constitutional: She appears well-developed and well-nourished.  Neck: Neck supple.  Cardiovascular: Regular rhythm and normal heart sounds.  Tachycardia present.   Pulmonary/Chest: Effort normal and breath sounds normal. She has no wheezes. She has no rales. Right breast exhibits no inverted nipple, no mass, no nipple discharge, no skin change and no tenderness. Left breast exhibits no inverted nipple, no mass, no nipple discharge, no skin change and no tenderness. Breasts are symmetrical.    Lymphadenopathy:    She has no cervical adenopathy.    Data Reviewed BILATERAL BREAST MRI WITH AND WITHOUT CONTRAST  Technique: Multiplanar, multisequence MR images of both breasts  were obtained prior to and following the intravenous  administration  of 13ml of Multihance. Three dimensional images were evaluated at  the independent DynaCad workstation.  Comparison: Bilateral breast MRI 07/19/2012  Findings: The right breast is smaller than the left. There has  been a complete MRI response of the right breast to neoadjuvant  therapy. The previously seen diffuse abnormal enhancement  throughout the right breast on MRI of 07/19/2012 has resolved.  Currently, both breasts have symmetric mild background parenchymal  enhancement pattern  and the vascularity of both breasts is  symmetric. There is no abnormal or asymmetric T2-weighted signal  in either breast.  The left breast remains negative.  There is no axillary lymphadenopathy. Specifically, the right  axillary lymph nodes have significantly decreased in size and are  now symmetric compared to left axillary lymph nodes. There is no  internal mammary chain lymphadenopathy.  IMPRESSION:  1. Complete MRI response of the right breast to neoadjuvant  therapy.  2. The left breast remains negative.  3. Right axillary lymph nodes are now normal in size.    Assessment    Right breast inflammatory cancer s/p chemotherapy Recent dvt, pna    Plan    Right mrm Will move one week to do on 3/11.  We again discussed surgery and risks.         Jessica Wells 01/25/2013, 12:10 PM

## 2013-02-09 ENCOUNTER — Encounter (HOSPITAL_COMMUNITY): Payer: Self-pay | Admitting: General Surgery

## 2013-02-09 LAB — BASIC METABOLIC PANEL
GFR calc Af Amer: >90 mL/min (ref >90)
GFR calc non Af Amer: 86 mL/min — ABNORMAL LOW (ref >90)
Glucose, Bld: 104 mg/dL — ABNORMAL HIGH (ref 70–99)
Potassium: 3.4 mEq/L — ABNORMAL LOW (ref 3.5–5.1)
Sodium: 137 mEq/L (ref 135–145)

## 2013-02-09 LAB — CBC
Hemoglobin: 8.6 g/dL — ABNORMAL LOW (ref 12.0–15.0)
RBC: 2.88 MIL/uL — ABNORMAL LOW (ref 3.87–5.11)
WBC: 9.3 10*3/uL (ref 4.0–10.5)

## 2013-02-09 MED ORDER — ENOXAPARIN SODIUM 80 MG/0.8ML ~~LOC~~ SOLN
70.0000 mg | Freq: Two times a day (BID) | SUBCUTANEOUS | Status: DC
  Administered 2013-02-09 – 2013-02-10 (×4): 70 mg via SUBCUTANEOUS
  Filled 2013-02-09 (×6): qty 0.8

## 2013-02-09 MED ORDER — DIPHENHYDRAMINE HCL 50 MG/ML IJ SOLN
12.5000 mg | Freq: Four times a day (QID) | INTRAMUSCULAR | Status: DC | PRN
  Administered 2013-02-09 – 2013-02-10 (×2): 12.5 mg via INTRAVENOUS
  Filled 2013-02-09 (×2): qty 1

## 2013-02-09 NOTE — Care Management Note (Signed)
    Page 1 of 1   02/09/2013     10:45:45 AM   CARE MANAGEMENT NOTE 02/09/2013  Patient:  Jessica Wells, Jessica Wells   Account Number:  [id number]  Date Initiated:  02/09/2013  Documentation initiated by:  Lorenda Ishihara  Subjective/Objective Assessment:   67 yo female admitted s/p mod rad mastectomy. PTA lived at home alone.     Action/Plan:   Home when stable   Anticipated DC Date:  02/10/2013   Anticipated DC Plan:  HOME/SELF CARE      DC Planning Services  CM consult      Choice offered to / List presented to:             Status of service:  Completed, signed off Medicare Important Message given?   (If response is "NO", the following Medicare IM given date fields will be blank) Date Medicare IM given:   Date Additional Medicare IM given:    Discharge Disposition:  HOME/SELF CARE  Per UR Regulation:  Reviewed for med. necessity/level of care/duration of stay  If discussed at Long Length of Stay Meetings, dates discussed:    Comments:

## 2013-02-09 NOTE — Progress Notes (Signed)
Per night RN Dorris Fetch patient emptied JP drains herself and per NT patient emptied at 1000 and did not save drainage amount

## 2013-02-09 NOTE — Progress Notes (Signed)
1 Day Post-Op  Subjective: Feels well this am except for itching, discussed results of surgery  Objective: Vital signs in last 24 hours: Temp:  [97.5 F (36.4 C)-98.8 F (37.1 C)] 98.8 F (37.1 C) (03/12 0534) Pulse Rate:  [66-87] 84 (03/12 0821) Resp:  [12-22] 18 (03/12 0534) BP: (96-146)/(36-80) 107/50 mmHg (03/12 0821) SpO2:  [96 %-100 %] 98 % (03/12 0534) Weight:  [139 lb (63.05 kg)] 139 lb (63.05 kg) (03/11 1500) Last BM Date: 02/07/13  Intake/Output from previous day: 03/11 0701 - 03/12 0700 In: 2250 [I.V.:2250] Out: 1565 [Urine:1275; Drains:265; Blood:25] Intake/Output this shift:    General appearance: no distress Resp: clear to auscultation bilaterally Cardio: regular rate and rhythm GI: soft Right mastectomy incision clean without infection, no hematoma, drains with serosang fluid  Lab Results:   Recent Labs  02/09/13 0405  WBC 9.3  HGB 8.6*  HCT 25.8*  PLT 201   BMET  Recent Labs  02/09/13 0405  NA 137  K 3.4*  CL 103  CO2 25  GLUCOSE 104*  BUN 9  CREATININE 0.74  CALCIUM 8.3*   PT/INR  Recent Labs  02/08/13 0940  LABPROT 12.7  INR 0.96   Assessment/Plan: POD 1 right mrm 1. Cont pain control, add benadryl 2. pulm toilet 3. Will begin anticoagulation today, hct down I think postop but there is no evidence of active bleeding, risk of bleeding is there but I think with her recent pe/dvt she needs to start this.  [facility] 02/09/2013

## 2013-02-10 LAB — CBC
HCT: 27 % — ABNORMAL LOW (ref 36.0–46.0)
Hemoglobin: 9.3 g/dL — ABNORMAL LOW (ref 12.0–15.0)
MCV: 89.4 fL (ref 78.0–100.0)
RBC: 3.02 MIL/uL — ABNORMAL LOW (ref 3.87–5.11)
WBC: 6 10*3/uL (ref 4.0–10.5)

## 2013-02-10 LAB — BASIC METABOLIC PANEL
BUN: 11 mg/dL (ref 6–23)
CO2: 26 mEq/L (ref 19–32)
Chloride: 106 mEq/L (ref 96–112)
Creatinine, Ser: 0.84 mg/dL (ref 0.50–1.10)

## 2013-02-10 MED ORDER — HYDROCODONE-ACETAMINOPHEN 10-325 MG PO TABS
1.0000 | ORAL_TABLET | Freq: Four times a day (QID) | ORAL | Status: DC | PRN
  Administered 2013-02-10 – 2013-02-11 (×2): 1 via ORAL
  Filled 2013-02-10 (×2): qty 1

## 2013-02-10 NOTE — Progress Notes (Signed)
2 Days Post-Op  Subjective: Feels well, no complaints, itching with pain meds, would like to go home  Objective: Vital signs in last 24 hours: Temp:  [98.3 F (36.8 C)-98.9 F (37.2 C)] 98.3 F (36.8 C) (03/13 0535) Pulse Rate:  [75-85] 75 (03/13 0535) Resp:  [18] 18 (03/13 0535) BP: (97-146)/(45-70) 146/65 mmHg (03/13 0535) SpO2:  [94 %-99 %] 94 % (03/13 0535) Last BM Date: 02/07/13  Intake/Output from previous day: 03/12 0701 - 03/13 0700 In: 431.8 [I.V.:431.8] Out: 2405 [Urine:2150; Drains:255] Intake/Output this shift:    General appearance: no distress Resp: clear to auscultation bilaterally Cardio: regular rate and rhythm Incision/Wound:right mastectomy incision clean without infection, mild hematoma but not large/tense, drains with serosang output  Lab Results:   Recent Labs  02/09/13 0405 02/10/13 0400  WBC 9.3 6.0  HGB 8.6* 9.3*  HCT 25.8* 27.0*  PLT 201 200   BMET  Recent Labs  02/09/13 0405 02/10/13 0400  NA 137 140  K 3.4* 3.4*  CL 103 106  CO2 25 26  GLUCOSE 104* 97  BUN 9 11  CREATININE 0.74 0.84  CALCIUM 8.3* 8.7   PT/INR  Recent Labs  02/08/13 0940  LABPROT 12.7  INR 0.96   ABG No results found for this basename: PHART, PCO2, PO2, HCO3,  in the last 72 hours  Studies/Results: No results found.  Anti-infectives: Anti-infectives   Start     Dose/Rate Route Frequency Ordered Stop   02/08/13 1600  ceFAZolin (ANCEF) IVPB 1 g/50 mL premix     1 g 100 mL/hr over 30 Minutes Intravenous 3 times per day 02/08/13 1525 02/09/13 0601   02/08/13 0918  ceFAZolin (ANCEF) IVPB 2 g/50 mL premix     2 g 100 mL/hr over 30 Minutes Intravenous On call to O.R. 02/08/13 0918 02/08/13 1140      Assessment/Plan: POD 2 right mrm for inflammatory breast cancer 1. Change pain meds to hydrocodone due to itching she thinks due to oxycodone 2. She has a little hematoma at her mastectomy site but is small, drains mostly serosang with expected  drainage, I will continue lovenox due to risk of being off of it and monitor her here for another 24 hours to ensure this does not expand and she doesn't need to return to or  [facility] 02/10/2013

## 2013-02-11 ENCOUNTER — Ambulatory Visit: Payer: Medicare Other

## 2013-02-11 ENCOUNTER — Ambulatory Visit: Payer: Medicare Other | Admitting: Adult Health

## 2013-02-11 ENCOUNTER — Other Ambulatory Visit: Payer: Medicare Other | Admitting: Lab

## 2013-02-11 ENCOUNTER — Telehealth (INDEPENDENT_AMBULATORY_CARE_PROVIDER_SITE_OTHER): Payer: Self-pay

## 2013-02-11 LAB — CBC
HCT: 29.4 % — ABNORMAL LOW (ref 36.0–46.0)
MCHC: 33.7 g/dL (ref 30.0–36.0)
MCV: 90.5 fL (ref 78.0–100.0)
RDW: 15.1 % (ref 11.5–15.5)

## 2013-02-11 MED ORDER — PATIENT'S GUIDE TO USING COUMADIN BOOK
Freq: Once | Status: DC
  Filled 2013-02-11: qty 1

## 2013-02-11 MED ORDER — HYDROCODONE-ACETAMINOPHEN 5-325 MG PO TABS: 1.0000 | ORAL_TABLET | ORAL | Status: DC | PRN

## 2013-02-11 MED ORDER — COUMADIN BOOK
Freq: Once | Status: AC
  Administered 2013-02-11: 10:00:00
  Filled 2013-02-11: qty 1

## 2013-02-11 MED ORDER — WARFARIN SODIUM 5 MG PO TABS: 5.0000 mg | ORAL_TABLET | Freq: Every day | ORAL | Status: DC

## 2013-02-11 MED ORDER — WARFARIN VIDEO
Freq: Once | Status: AC
  Administered 2013-02-11: 10:00:00

## 2013-02-11 NOTE — Progress Notes (Signed)
Discharged via wheelchair. rx for norco given. Patient states understanding of drain care and discharge instructions. I spoke with Dr. Dwain Sarna, MD aware patient last INR 3/11, Md states no need to recheck today patient has not been on coumadin prior and that he has spoken with patient oncologist that will set up appointment to monitor INR. Pharmacist has spoken with patient and patient has coumadin handbook and video viewed.

## 2013-02-11 NOTE — Progress Notes (Signed)
3 Days Post-Op  Subjective: Feels well wants to go home  Objective: Vital signs in last 24 hours: Temp:  [98 F (36.7 C)-98.1 F (36.7 C)] 98 F (36.7 C) (03/14 0515) Pulse Rate:  [68-73] 72 (03/14 0515) Resp:  [18] 18 (03/14 0515) BP: (122-126)/(73-78) 125/76 mmHg (03/14 0515) SpO2:  [95 %-96 %] 95 % (03/14 0515) Last BM Date: 02/07/13  Intake/Output from previous day: 03/13 0701 - 03/14 0700 In: 885 [P.O.:720; I.V.:165] Out: 2260 [Urine:2100; Drains:160] Intake/Output this shift:    General appearance: no distress Incision/Wound:incision without infection, no hematoma, drains with expected output  Lab Results:   Recent Labs  02/10/13 0400 02/11/13 0420  WBC 6.0 5.6  HGB 9.3* 9.9*  HCT 27.0* 29.4*  PLT 200 223   BMET  Recent Labs  02/09/13 0405 02/10/13 0400  NA 137 140  K 3.4* 3.4*  CL 103 106  CO2 25 26  GLUCOSE 104* 97  BUN 9 11  CREATININE 0.74 0.84  CALCIUM 8.3* 8.7   PT/INR  Recent Labs  02/08/13 0940  LABPROT 12.7  INR 0.96    Assessment/Plan: POD 3 right mrm Will dc home today on coumadin/lovenox with f/u with dr Welton Flakes next week i will see next week   [facility] 02/11/2013

## 2013-02-11 NOTE — Progress Notes (Signed)
  Pharmacy Note (Brief) - Warfarin Education  67 yo F being sent home today on warfarin/lovenox bridge by MD for recent VTE. Standard warfarin education completed with patient. Patient very bright and engaged in education. Was reading warfarin book when I walked into room. Asked appropriate questions. Verbalized understanding. Very confident that this patient will be actively engaged in her warfarin therapy as an outpatient. Patient has no further questions at this time.  Darrol Angel, PharmD Pager: [phone] 02/11/2013 10:18 AM

## 2013-02-11 NOTE — Telephone Encounter (Signed)
LMOM giving pt her f/u appt with Dr Dwain Sarna for 3/21 arrive at 8:30 for 8:45.

## 2013-02-14 DIAGNOSIS — IMO0001 Reserved for inherently not codable concepts without codable children: Secondary | ICD-10-CM

## 2013-02-14 DIAGNOSIS — Z9981 Dependence on supplemental oxygen: Secondary | ICD-10-CM

## 2013-02-14 DIAGNOSIS — Z8701 Personal history of pneumonia (recurrent): Secondary | ICD-10-CM

## 2013-02-14 DIAGNOSIS — I82409 Acute embolism and thrombosis of unspecified deep veins of unspecified lower extremity: Secondary | ICD-10-CM

## 2013-02-14 DIAGNOSIS — C50919 Malignant neoplasm of unspecified site of unspecified female breast: Secondary | ICD-10-CM

## 2013-02-14 DIAGNOSIS — M159 Polyosteoarthritis, unspecified: Secondary | ICD-10-CM

## 2013-02-14 DIAGNOSIS — J45909 Unspecified asthma, uncomplicated: Secondary | ICD-10-CM

## 2013-02-14 LAB — AFB CULTURE WITH SMEAR (NOT AT ARMC): Acid Fast Smear: NONE SEEN

## 2013-02-14 NOTE — Discharge Summary (Signed)
Physician Discharge Summary  Patient ID: Jessica Wells MRN: [id number] DOB/AGE: 03/21/1946 67 y.o.  Admit date: 02/08/2013 Discharge date: 02/14/2013  Admission Diagnoses: Right inflammatory breast cancer VTE  Discharge Diagnoses:  Active Problems:   * No active hospital problems. *   Discharged Condition: good  Hospital Course: 67 yof who presented with right breast cancer that was noted to be inflammmatory.  She has undergone primary chemotherapy with complete clinical and radiologic response.  She had significant pulmonary event towards end of therapy which was pna as well as a pe.  She was intubated for this and placed on anticoagulation.  We have waited about five weeks after this and she was clinically asymptomatic.  She underwent right mrm without difficulty.  She was placed back on lovenox and then on coumadin without difficulty. She was discharged home on postop day 3 after restarting anticoagulation.  Consults: none  Significant Diagnostic Studies: none  Treatments: right MRM   Disposition: 01-Home or Self Care   Future Appointments Provider Department Dept Phone   02/18/2013 8:45 AM Emelia Loron, MD [facility], [place] [phone]   02/24/2013 11:00 AM Leslye Peer, MD [facility] [phone]   02/25/2013 11:10 AM Emelia Loron, MD [facility], [place] [phone]   03/04/2013 8:15 AM Krista Blue [facility] MEDICAL ONCOLOGY [phone]   03/04/2013 8:45 AM Augustin Schooling, NP [facility] MEDICAL ONCOLOGY [phone]   03/04/2013 9:30 AM Nyoka Cowden, MD [facility] [phone]   03/04/2013 10:15 AM [facility] MED[facility] ON[facility] [phone]   03/21/2013 2:00 PM [facility] [phone]   03/21/2013 3:00 PM [facility] [phone]   03/25/2013 1:00 PM Sherrie Mustache [facility] MEDICAL ONCOLOGY [phone]   03/25/2013 1:15 PM Augustin Schooling, NP [facility] MEDICAL ONCOLOGY [phone]   03/25/2013 2:15 PM [facility] MEDICAL ONCOLOGY [phone]       Medication List    TAKE these medications       b complex vitamins tablet  Take 1 tablet by mouth daily.     calcium-vitamin D 500-200 MG-UNIT per tablet  Commonly known as:  OSCAL WITH D  Take 1 tablet by mouth every other day.     enoxaparin 80 MG/0.8ML injection  Commonly known as:  LOVENOX  Inject 0.7 mLs (70 mg total) into the skin every 12 (twelve) hours.     fish oil-omega-3 fatty acids 1000 MG capsule  Take 2 g by mouth daily.     FLAX SEEDS PO  Take 1 tablet by mouth daily.     furosemide 20 MG tablet  Commonly known as:  LASIX  Take 20 mg by mouth as needed (If you gain more than 5 pounds in one day, take one pill. ).     glucosamine-chondroitin 500-400 MG tablet  Take 1 tablet by mouth daily.     HYDROcodone-acetaminophen 5-325 MG per tablet  Commonly known as:  NORCO/VICODIN  Take 1-2 tablets by mouth every 4 (four) hours as needed for pain.     lidocaine-prilocaine cream  Commonly known as:  EMLA  Apply topically as needed.     metoprolol succinate 50 MG 24 hr tablet  Commonly known as:  TOPROL-XL  Take 50 mg by mouth daily before breakfast. Take with or immediately following a meal.  multivitamin with minerals Tabs  Take 1 tablet by mouth every other day.     omeprazole 20 MG capsule  Commonly known as:  PRILOSEC  Take 20 mg by mouth daily as needed. For heartburn     ondansetron 8 MG tablet  Commonly known as:  ZOFRAN  Take 8 mg by mouth every 8 (eight) hours as needed for nausea.     potassium chloride 10 MEQ tablet  Commonly known as:  K-DUR,KLOR-CON  Take 10 mEq by mouth as needed (If you take Lasix, take the potassium pill).     PROVENTIL HFA 108 (90 BASE) MCG/ACT inhaler   Generic drug:  albuterol     warfarin 5 MG tablet  Commonly known as:  COUMADIN  Take 1 tablet (5 mg total) by mouth daily.           Follow-up Information   Follow up with [facility], MD In 1 week.   Contact information:   [address] [place] [address] [phone]       Follow up with Drue Second, MD In 1 week. (her office will call)    Contact information:   [address] [place] [address] [phone]       Signed: Emelia Loron 02/14/2013, 11:24 AM

## 2013-02-15 ENCOUNTER — Telehealth: Payer: Self-pay | Admitting: Medical Oncology

## 2013-02-15 NOTE — Telephone Encounter (Signed)
Pt LVMOM stating needed to r/s chemo appt for this week. Per MD/NP, patient to see Augustin Schooling NP 02/17/13 @ 0945 with treatment to follow. Onc treatment sent. Scheduling to notify patient.

## 2013-02-16 ENCOUNTER — Other Ambulatory Visit: Payer: Self-pay | Admitting: Emergency Medicine

## 2013-02-16 ENCOUNTER — Ambulatory Visit (INDEPENDENT_AMBULATORY_CARE_PROVIDER_SITE_OTHER): Payer: Medicare Other | Admitting: General Surgery

## 2013-02-16 ENCOUNTER — Telehealth: Payer: Self-pay | Admitting: *Deleted

## 2013-02-16 ENCOUNTER — Encounter (INDEPENDENT_AMBULATORY_CARE_PROVIDER_SITE_OTHER): Payer: Self-pay | Admitting: General Surgery

## 2013-02-16 VITALS — BP 137/83 | HR 64 | Temp 97.6°F | Resp 12 | Ht 61.0 in | Wt 138.4 lb

## 2013-02-16 DIAGNOSIS — Z09 Encounter for follow-up examination after completed treatment for conditions other than malignant neoplasm: Secondary | ICD-10-CM

## 2013-02-16 MED ORDER — WARFARIN SODIUM 5 MG PO TABS: 5.0000 mg | ORAL_TABLET | Freq: Every day | ORAL | Status: DC

## 2013-02-16 NOTE — Progress Notes (Signed)
Subjective:     Patient ID: Jessica Wells, female   DOB: 04/17/1946, 67 y.o.   MRN: [id number]  HPI 28 yof with right breast inflammatory cancer who underwent right mrm recently. Her path in both breast and nodes showed no residual disease.  She returns today with both drains putting out a little over 30 cc.  She is on lovenox and coumadin and tolerating well.  No hematoma.  She complains of expected pain.  We discussed pathology.  Review of Systems     Objective:   Physical Exam Right mastectomy incision healing well, there is swelling laterally where the binder has pushed this up, no hematoma, no infection    Assessment:     S/p right mrm     Plan:     I asked her to stop wearing binder and switch to sports bra as I think this is cause of fold Will call  Monday about drainage and having removed I gave her more warfarin, she needs to see Dr. Welton Flakes later this week for inr check and follow up and she thinks this will be Friday.

## 2013-02-16 NOTE — Patient Instructions (Signed)
1.  Call me Monday with drain outputs 2.  Start wearing sports bra instead of binder

## 2013-02-16 NOTE — Telephone Encounter (Signed)
Confirmed 02/21/13 appt w/ pt.  Told Sharyl Nimrod so she could put in orders.

## 2013-02-17 ENCOUNTER — Ambulatory Visit: Payer: Medicare Other | Admitting: Adult Health

## 2013-02-17 ENCOUNTER — Other Ambulatory Visit: Payer: Medicare Other | Admitting: Lab

## 2013-02-18 ENCOUNTER — Telehealth (INDEPENDENT_AMBULATORY_CARE_PROVIDER_SITE_OTHER): Payer: Self-pay

## 2013-02-18 ENCOUNTER — Ambulatory Visit (INDEPENDENT_AMBULATORY_CARE_PROVIDER_SITE_OTHER): Payer: Medicare Other | Admitting: Surgery

## 2013-02-18 ENCOUNTER — Ambulatory Visit: Payer: Medicare Other | Admitting: Adult Health

## 2013-02-18 ENCOUNTER — Encounter (INDEPENDENT_AMBULATORY_CARE_PROVIDER_SITE_OTHER): Payer: Self-pay | Admitting: Surgery

## 2013-02-18 ENCOUNTER — Encounter (INDEPENDENT_AMBULATORY_CARE_PROVIDER_SITE_OTHER): Payer: Medicare Other | Admitting: General Surgery

## 2013-02-18 ENCOUNTER — Other Ambulatory Visit: Payer: Medicare Other | Admitting: Lab

## 2013-02-18 VITALS — BP 138/88 | HR 76 | Temp 97.4°F | Resp 14 | Ht 61.0 in | Wt 138.8 lb

## 2013-02-18 DIAGNOSIS — C50119 Malignant neoplasm of central portion of unspecified female breast: Secondary | ICD-10-CM

## 2013-02-18 DIAGNOSIS — C50111 Malignant neoplasm of central portion of right female breast: Secondary | ICD-10-CM

## 2013-02-18 NOTE — Progress Notes (Signed)
[facility]  Ovidio Kin, MD,  FACS [address] [place]  [address] [place], [place]    [address] Phone:  [phone] FAX:  [phone]   Re:   Jessica Wells DOB:   Jan 04, 1946 MRN:   [id number]  Urgent Office  ASSESSMENT AND PLAN: 1.  Swelling right axilla  100 cc seroma that I aspirated.  It is not infected.  Has appt to see Dr. Dwain Sarna Monday, 3/24.  2.  S/P right MRM - 02/08/2013 - M. [place]  HISTORY OF PRESENT ILLNESS: Chief Complaint  Patient presents with  . Follow-up    breast drains    Jessica Wells is a 67 y.o. (DOB: 1945/12/11)  white  female who is a patient of [name], MD and comes to the urgent office today with pain and swelling in her right axilla. She had a right MRM - 02/08/2013 - M. Dwain Sarna, and still has two drains in. They are draining about 20 to 40 cc/day of straw colored fluid.  She was concerned about an infection and wanted to be seen.  She has an appt with dr. Dwain Sarna on Monday, 02/21/2013.  PHYSICAL EXAM: BP 138/88  Pulse 76  Temp(Src) 97.4 F (36.3 C) (Temporal)  Resp 14  Ht 5\' 1"  (1.549 m)  Wt 138 lb 12.8 oz (62.959 kg)  BMI 26.24 kg/m2  Breasts:  Right - absent.  The incision looks okay.  She has two drains inferior to the mastectomy incision that look okay.  In her right axilla, at the edge of the pectoralis major, she has a 5x6 cm mass consistent with a seroma.  While in the office I aspirated about 100 cc of straw colored fluid.  It is not infected.  DATA REVIEWED: Drainage charts the patient brought.  Ovidio Kin, MD, FACS Office:  [phone]

## 2013-02-18 NOTE — Telephone Encounter (Signed)
Pt calling in today b/c worried about her axilla having fluid that is causing her arm to be really tight. The pt had a radical masty by Dr Dwain Sarna and still has drains in place. The pt wants to be checked before the weekend. I made her an appt to see Dr Ezzard Standing in urgent office at 3:30. I also made her an appt to see Dr Dwain Sarna on Monday 3/24 to arrive at 3:15/3:30.

## 2013-02-21 ENCOUNTER — Other Ambulatory Visit (HOSPITAL_BASED_OUTPATIENT_CLINIC_OR_DEPARTMENT_OTHER): Payer: Medicare Other | Admitting: Lab

## 2013-02-21 ENCOUNTER — Ambulatory Visit (HOSPITAL_BASED_OUTPATIENT_CLINIC_OR_DEPARTMENT_OTHER): Payer: Medicare Other | Admitting: Adult Health

## 2013-02-21 ENCOUNTER — Encounter: Payer: Self-pay | Admitting: Oncology

## 2013-02-21 ENCOUNTER — Encounter: Payer: Self-pay | Admitting: Adult Health

## 2013-02-21 ENCOUNTER — Ambulatory Visit (INDEPENDENT_AMBULATORY_CARE_PROVIDER_SITE_OTHER): Payer: Medicare Other | Admitting: General Surgery

## 2013-02-21 ENCOUNTER — Telehealth: Payer: Self-pay | Admitting: Oncology

## 2013-02-21 ENCOUNTER — Encounter (INDEPENDENT_AMBULATORY_CARE_PROVIDER_SITE_OTHER): Payer: Self-pay | Admitting: General Surgery

## 2013-02-21 ENCOUNTER — Other Ambulatory Visit (HOSPITAL_COMMUNITY): Payer: Self-pay | Admitting: Adult Health

## 2013-02-21 VITALS — BP 124/74 | HR 72 | Resp 18 | Ht 61.0 in | Wt 136.0 lb

## 2013-02-21 VITALS — BP 149/88 | HR 90 | Temp 97.6°F | Resp 20 | Ht 61.0 in | Wt 136.4 lb

## 2013-02-21 DIAGNOSIS — C50919 Malignant neoplasm of unspecified site of unspecified female breast: Secondary | ICD-10-CM

## 2013-02-21 DIAGNOSIS — I82403 Acute embolism and thrombosis of unspecified deep veins of lower extremity, bilateral: Secondary | ICD-10-CM

## 2013-02-21 DIAGNOSIS — C50119 Malignant neoplasm of central portion of unspecified female breast: Secondary | ICD-10-CM

## 2013-02-21 DIAGNOSIS — Z09 Encounter for follow-up examination after completed treatment for conditions other than malignant neoplasm: Secondary | ICD-10-CM

## 2013-02-21 DIAGNOSIS — Z86718 Personal history of other venous thrombosis and embolism: Secondary | ICD-10-CM

## 2013-02-21 DIAGNOSIS — R609 Edema, unspecified: Secondary | ICD-10-CM | POA: Diagnosis not present

## 2013-02-21 DIAGNOSIS — C3490 Malignant neoplasm of unspecified part of unspecified bronchus or lung: Secondary | ICD-10-CM

## 2013-02-21 DIAGNOSIS — C773 Secondary and unspecified malignant neoplasm of axilla and upper limb lymph nodes: Secondary | ICD-10-CM | POA: Diagnosis not present

## 2013-02-21 DIAGNOSIS — C50111 Malignant neoplasm of central portion of right female breast: Secondary | ICD-10-CM

## 2013-02-21 LAB — CBC WITH DIFFERENTIAL/PLATELET
BASO%: 0.7 % (ref 0.0–2.0)
Basophils Absolute: 0 10*3/uL (ref 0.0–0.1)
EOS%: 3.8 % (ref 0.0–7.0)
Eosinophils Absolute: 0.2 10*3/uL (ref 0.0–0.5)
HCT: 30 % — ABNORMAL LOW (ref 34.8–46.6)
HGB: 10.4 g/dL — ABNORMAL LOW (ref 11.6–15.9)
LYMPH%: 22.3 % (ref 14.0–49.7)
MCH: 31.2 pg (ref 25.1–34.0)
MCHC: 34.7 g/dL (ref 31.5–36.0)
MCV: 89.9 fL (ref 79.5–101.0)
MONO#: 0.5 10*3/uL (ref 0.1–0.9)
MONO%: 7.5 % (ref 0.0–14.0)
NEUT#: 4.2 10*3/uL (ref 1.5–6.5)
NEUT%: 65.7 % (ref 38.4–76.8)
Platelets: 276 10*3/uL (ref 145–400)
RBC: 3.34 10*6/uL — ABNORMAL LOW (ref 3.70–5.45)
RDW: 15.4 % — ABNORMAL HIGH (ref 11.2–14.5)
WBC: 6.4 10*3/uL (ref 3.9–10.3)
lymph#: 1.4 10*3/uL (ref 0.9–3.3)

## 2013-02-21 LAB — COMPREHENSIVE METABOLIC PANEL (CC13)
ALT: 38 U/L (ref 0–55)
AST: 23 U/L (ref 5–34)
Albumin: 3.3 g/dL — ABNORMAL LOW (ref 3.5–5.0)
BUN: 18.4 mg/dL (ref 7.0–26.0)
Calcium: 9.9 mg/dL (ref 8.4–10.4)
Chloride: 105 mEq/L (ref 98–107)
Potassium: 3.8 mEq/L (ref 3.5–5.1)

## 2013-02-21 LAB — PROTIME-INR: INR: 1.7 — ABNORMAL LOW (ref 2.00–3.50)

## 2013-02-21 MED ORDER — ENOXAPARIN SODIUM 80 MG/0.8ML ~~LOC~~ SOLN: 70.0000 mg | Freq: Two times a day (BID) | SUBCUTANEOUS | Status: DC

## 2013-02-21 MED ORDER — WARFARIN SODIUM 5 MG PO TABS: 5.0000 mg | ORAL_TABLET | Freq: Every day | ORAL | Status: DC

## 2013-02-21 NOTE — Patient Instructions (Addendum)
Doing well.  We will get you scheduled for Herceptin this week.  Take Coumadin 7.5mg  tonight, Tuesday, and Thursday.  Take 5mg  on Sunday, Wednesday, Friday and Saturday.  We will repeat a level on Friday to evaluate.  We will do Herceptin on Friday.  Please call us if you have any questions or concerns.

## 2013-02-21 NOTE — Progress Notes (Addendum)
[facility]  Telephone:([phone]) [phone] [phone]) [phone]  OFFICE PROGRESS NOTE   PATIENT: Jessica Wells   DOB: Apr 16, 1946  MR#: [id number]  JYN#:[id number]   QM:VHQION,GEXBMWUXL STEWART, MD Lurline Hare, MD Emelia Loron, MD Arvilla Meres, MD Charlaine Dalton. Sherene Sires, MD    DIAGNOSIS:  67 year-old [place], [place] woman with a recent diagnosis of inflammatory breast cancer.   PRIOR THERAPY: 1.  The patient was originally seen in clinic for new diagnosis of inflammatory breast cancer, she was referred by Dr. Emelia Loron. She had a mammogram performed on 07/14/2012 that showed an abnormality. That was biopsied and it showed an invasive mammary carcinoma with lymphovascular invasion grade 3 ER negative PR negative HER-2/neu positive.   2.  The patient had a MRI of the breasts performed on 07/19/2012.  The MRI showed diffuse right breast neoplasm on a large level I right axillary lymph nodes compatible with lymphatic spread.  The patient has had a biopsy of the right axillary lymph node that is compatible with invasive mammary carcinoma.   3.  The patient began neoadjuvant FEC 100 with day 2 Neulasta support on 07/23/2012.   4.   Weekly neoadjuvant Taxol and Herceptin started on 09/17/12.  The patient developed neuropathies, and Taxol was discontinued early.  She completed 7 weeks of Taxol/Herceptin combination therapy.  The patient received 2 weeks of Herceptin only.   5. Weekly Gemcitabine and Herceptin was started on 11/19/12 and completed on 12/17/2012 after 5 cycles.  6.  A regimen of Lasix 20 mg PO and Kdur 10 mEq was started on 01/07/2013 due to chronic lower extremity edema.  Medications are only to be taken if patient has a weight gain of 5 lbs or more in a day (daily weights).   7.  Anemia was treated with blood transfusions of 1 unit of PRBCs given on 01/11/2013 and another unit of PRBC's given 01/12/2013.   CURRENT THERAPY:  Herceptin every 3  weeks  INTERVAL HISTORY: [name] is doing well today.  She has completed her surgery and had a pathologic response to chemotherapy.  She was placed on Coumadin and is tolerating it well.  She is currently taking both Coumadin and Lovenox until her levels are therapeutic.  She is concerned regarding the BAL results from the hospital and would like to get scanned to know if there is any lung cancer anywhere in her body.  She had her surgery and did well, she had a seroma drained this past Friday, and has f/u this afternoon regarding it.  Otherwise she is doing well and a 10 point ROS is neg.   PAST MEDICAL HISTORY: Past Medical History  Diagnosis Date  . GERD (gastroesophageal reflux disease)   . Dysrhythmia     PAT-sees dr Laurence Compton meds  . Breast cancer 07/14/12    inflammatory right breast ca, ER/PR -  . Allergy   . PAT (paroxysmal atrial tachycardia)     hx  . Arthritis     osteopenia,knees  . PONV (postoperative nausea and vomiting) 09-13-12    severe, with Port-a-cath, was managed without PONV  . Clotting disorder     prothrombin gene mutation-heterozygous   . History of chemotherapy     Last dose to be 02-04-13.  Was rx'd with Herceptin & Gemzar  . DVT (deep venous thrombosis)   . Asthma     Uses Inhalers Proventil as needed  . Bursitis 01-25-13    Rt. shoulder- mildly affected now.  . History  of blood transfusion 01-25-13    2 units 01-11-13  . Pneumonia 01-25-13    hx. 12-27-12-hospital stay x 9 days, now resolved.    PAST SURGICAL HISTORY: Past Surgical History  Procedure Laterality Date  . Dilation and curettage of uterus      x3  . Tonsillectomy    . Colonoscopy    . Portacath placement  07/21/2012    Procedure: INSERTION PORT-A-CATH;  Surgeon: Emelia Loron, MD;  Location: [facility];  Service: General;  Laterality: Left;/ Replacement done 10'13  . Insertion of vena cava filter  12/2012  . Giant cell tumor  01-25-13    removed Rt. forearm.  . Eye  lid surgery      right( MD office)  . Mastectomy modified radical Right 02/08/2013    Procedure: MASTECTOMY MODIFIED RADICAL;  Surgeon: Emelia Loron, MD;  Location: [facility];  Service: General;  Laterality: Right;  . Breast surgery      FAMILY HISTORY: Family History  Problem Relation Age of Onset  . Diabetes Mother   . Heart disease Mother   . Hypertension Mother   . Hyperlipidemia Mother   . Breast cancer Mother 82    lobular breast cancer  . Heart disease Maternal Aunt   . Heart disease Maternal Uncle   . Heart disease Maternal Grandmother     died in her 77s from heart disease  . Lung cancer Father 72    adenocarcinoma  . Cancer Paternal Uncle     dx in mid 47s with a cancer in the leg, died late 32s; grandpaternal half uncle    SOCIAL HISTORY: History  Substance Use Topics  . Smoking status: Former Smoker -- 1.00 packs/day for 35 years    Types: Cigarettes    Quit date: 07/21/1979  . Smokeless tobacco: Never Used  . Alcohol Use: No    ALLERGIES: Allergies  Allergen Reactions  . Anesthetics, Amide Nausea And Vomiting    Projectile vomitting-Nausea- with 24hrs of dry heaves. With any anesthetics  . Bactrim (Sulfamethoxazole W-Trimethoprim) Other (See Comments)    Massive diarrhea, nausea vomiting, platelets dropped, hemoglobin dropped, admitted to hospital  . Sulfa Antibiotics Nausea And Vomiting and Other (See Comments)    Crazy feeling in head Life threatening reaction, decreased blood counts  . Codeine Nausea And Vomiting  . Codeine Phosphate Nausea And Vomiting  . Eggs Or Egg-Derived Products Hives     MEDICATIONS:  Current Outpatient Prescriptions  Medication Sig Dispense Refill  . b complex vitamins tablet Take 1 tablet by mouth daily.      . calcium-vitamin D (OSCAL WITH D) 500-200 MG-UNIT per tablet Take 1 tablet by mouth every other day.       . enoxaparin (LOVENOX) 80 MG/0.8ML injection Inject 0.7 mLs (70 mg total) into the skin every 12  (twelve) hours.  60 Syringe  3  . fish oil-omega-3 fatty acids 1000 MG capsule Take 2 g by mouth daily.      . Flaxseed, Linseed, (FLAX SEEDS PO) Take 1 tablet by mouth daily.      Marland Kitchen glucosamine-chondroitin 500-400 MG tablet Take 1 tablet by mouth daily.      Marland Kitchen lidocaine-prilocaine (EMLA) cream Apply topically as needed.  30 g  5  . metoprolol succinate (TOPROL-XL) 50 MG 24 hr tablet Take 50 mg by mouth daily before breakfast. Take with or immediately following a meal.      . Multiple Vitamin (MULTIVITAMIN WITH MINERALS) TABS Take 1 tablet  by mouth every other day.       Marland Kitchen omeprazole (PRILOSEC) 20 MG capsule Take 20 mg by mouth daily as needed. For heartburn      . ondansetron (ZOFRAN) 8 MG tablet Take 8 mg by mouth every 8 (eight) hours as needed for nausea.       Marland Kitchen PROVENTIL HFA 108 (90 BASE) MCG/ACT inhaler       . warfarin (COUMADIN) 5 MG tablet Take 1 tablet (5 mg total) by mouth daily.  21 tablet  0   No current facility-administered medications for this visit.   Facility-Administered Medications Ordered in Other Visits  Medication Dose Route Frequency Provider Last Rate Last Dose  . sodium chloride 0.9 % injection 10 mL  10 mL Intracatheter PRN Keitha Butte, NP   10 mL at 01/21/13 1634      REVIEW OF SYSTEMS: A 10 point review of systems was completed and is negative except as noted above.    PHYSICAL EXAMINATION: BP 149/88  Pulse 90  Temp(Src) 97.6 F (36.4 C) (Oral)  Resp 20  Ht 5\' 1"  (1.549 m)  Wt 136 lb 6.4 oz (61.871 kg)  BMI 25.79 kg/m2     General appearance: Alert, cooperative, well nourished, no apparent distress Head:  Atraumatic, normocephalic Eyes: Conjunctivae/corneas clear, PERRLA, EOMI Nose: No drainage or sinus tenderness Neck: No adenopathy, supple, symmetrical, trachea midline, thyroid not enlarged, no tenderness Resp: Clear to auscultation bilaterally, diminished bibasilar breath sounds Cardio: Regular rate and rhythm, S1, S2 normal, no murmur,  click, rub or gallop, left chest Port-A-Cath covered with EMLA cream Breasts:right mastectomy site healing well, seroma in right axillary tissue, drains in place.  Left breast not examined today GI: Soft, distended, non-tender, hypoactive bowel sounds, no organomegaly Extremities: Extremities normal, atraumatic, no cyanosis, LE edema, LLE (+2)  is larger than RLE (trace edema) Lymph nodes: Cervical, supraclavicular, and axillary nodes normal Neurologic: Grossly normal    ECOG FS:  Grade 2 - Symptomatic, but completely ambulatory  LAB RESULTS: Lab Results  Component Value Date   WBC 6.4 02/21/2013   NEUTROABS 4.2 02/21/2013   HGB 10.4* 02/21/2013   HCT 30.0* 02/21/2013   MCV 89.9 02/21/2013   PLT 276 02/21/2013      Chemistry      Component Value Date/Time   NA 140 02/10/2013 0400   NA 141 02/04/2013 1309   K 3.4* 02/10/2013 0400   K 4.0 02/04/2013 1309   CL 106 02/10/2013 0400   CL 104 02/04/2013 1309   CO2 26 02/10/2013 0400   CO2 27 02/04/2013 1309   BUN 11 02/10/2013 0400   BUN 17.7 02/04/2013 1309   CREATININE 0.84 02/10/2013 0400   CREATININE 1.1 02/04/2013 1309      Component Value Date/Time   CALCIUM 8.7 02/10/2013 0400   CALCIUM 10.1 02/04/2013 1309   ALKPHOS 68 02/04/2013 1309   ALKPHOS 66 01/28/2013 1630   AST 38* 02/04/2013 1309   AST 36 01/28/2013 1630   ALT 73* 02/04/2013 1309   ALT 55* 01/28/2013 1630   BILITOT 0.39 02/04/2013 1309   BILITOT 0.2* 01/28/2013 1630      Lab Results  Component Value Date   LABCA2 16 07/19/2012     RADIOGRAPHIC STUDIES: No results found.  ASSESSMENT: 67 y.o. [place], [place] woman with:  1.  Inflammatory right breast cancer with positive lymph nodes the tumor is ER negative PR negative HER-2/neu positive.  Patient is s/p neoadjuvant chemotherapy consisting of FEC q  2 weeks x 4 cycles.  Status post weekly Gemcitabine and Herceptin that was started on 11/19/12 and completed on 12/17/2012 after 5 cycles.   Herceptin Qweekly x 12 weeks started on 01/14/2013 and  she will receive a Herceptin infusion today.  Dr. Dwain Sarna is scheduled to perform a right breast radical mastectomy on 02/08/2013.  There are plans for radiation therapy with Dr. Michell Heinrich following surgery.     2.  Bilateral DVT and Bilateral PE  3.  Lower extremity edema   PLAN:  1.  Bilateral DVT and PE.  The patient has a positive Prothrombin Gene Mutation, heterozygous. S/p IVC filter inserted on  12/27/2012. She will continue on Coumadin and take 7.5 mg three nights per week and 5mg  4 nights per week.  We will recheck her on Friday  2. She will receive q3 week herceptin starting Friday 02/25/13.  I have requested her appts be changed accordingly.  3. I ordered a PET/CT scan to f/u on the adenocarcinoma found during the bronch with BAL while she was hospitalized.    4. I will see her back on 4/18 for her next Herceptin.   All questions were answered.  The patient was encouraged to contact us in the interim with any problems, questions or concerns.   I spent 40 minutes counseling the patient face to face.  The total time spent in the appointment was 60 minutes.  Cherie Ouch Lyn Hollingshead, NP Medical Oncology [facility] Phone: [phone] 02/21/2013, 2:05 PM

## 2013-02-21 NOTE — Telephone Encounter (Signed)
gv pt appt schedule for March thru May. Pt aware central will call w/scan appts.
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
## 2013-02-22 ENCOUNTER — Other Ambulatory Visit: Payer: Self-pay | Admitting: Certified Registered Nurse Anesthetist

## 2013-02-24 ENCOUNTER — Other Ambulatory Visit: Payer: Self-pay | Admitting: Adult Health

## 2013-02-24 ENCOUNTER — Encounter: Payer: Self-pay | Admitting: Emergency Medicine

## 2013-02-24 ENCOUNTER — Ambulatory Visit (INDEPENDENT_AMBULATORY_CARE_PROVIDER_SITE_OTHER): Payer: Medicare Other | Admitting: Emergency Medicine

## 2013-02-24 VITALS — BP 116/76 | HR 92 | Temp 98.0°F | Ht 61.0 in | Wt 138.4 lb

## 2013-02-24 DIAGNOSIS — R918 Other nonspecific abnormal finding of lung field: Secondary | ICD-10-CM

## 2013-02-24 NOTE — Progress Notes (Signed)
No visit - will f/u w MW in 6 months D/c O2

## 2013-02-24 NOTE — Assessment & Plan Note (Signed)
Resolved, has f/u with MW already

## 2013-02-25 ENCOUNTER — Telehealth: Payer: Self-pay | Admitting: Medical Oncology

## 2013-02-25 ENCOUNTER — Ambulatory Visit: Payer: Medicare Other | Admitting: Oncology

## 2013-02-25 ENCOUNTER — Ambulatory Visit (INDEPENDENT_AMBULATORY_CARE_PROVIDER_SITE_OTHER): Payer: Medicare Other | Admitting: General Surgery

## 2013-02-25 ENCOUNTER — Other Ambulatory Visit (HOSPITAL_BASED_OUTPATIENT_CLINIC_OR_DEPARTMENT_OTHER): Payer: Medicare Other | Admitting: Lab

## 2013-02-25 ENCOUNTER — Encounter (INDEPENDENT_AMBULATORY_CARE_PROVIDER_SITE_OTHER): Payer: Self-pay | Admitting: General Surgery

## 2013-02-25 ENCOUNTER — Other Ambulatory Visit: Payer: Medicare Other | Admitting: Lab

## 2013-02-25 ENCOUNTER — Ambulatory Visit: Payer: Medicare Other

## 2013-02-25 ENCOUNTER — Ambulatory Visit (HOSPITAL_BASED_OUTPATIENT_CLINIC_OR_DEPARTMENT_OTHER): Payer: Medicare Other

## 2013-02-25 ENCOUNTER — Ambulatory Visit: Payer: Medicare Other | Admitting: Adult Health

## 2013-02-25 VITALS — BP 144/82 | HR 88 | Temp 98.1°F | Resp 16 | Ht 61.0 in | Wt 136.0 lb

## 2013-02-25 DIAGNOSIS — Z09 Encounter for follow-up examination after completed treatment for conditions other than malignant neoplasm: Secondary | ICD-10-CM

## 2013-02-25 DIAGNOSIS — C50111 Malignant neoplasm of central portion of right female breast: Secondary | ICD-10-CM

## 2013-02-25 DIAGNOSIS — Z86718 Personal history of other venous thrombosis and embolism: Secondary | ICD-10-CM | POA: Diagnosis not present

## 2013-02-25 DIAGNOSIS — Z86711 Personal history of pulmonary embolism: Secondary | ICD-10-CM | POA: Diagnosis not present

## 2013-02-25 DIAGNOSIS — C50919 Malignant neoplasm of unspecified site of unspecified female breast: Secondary | ICD-10-CM

## 2013-02-25 DIAGNOSIS — I82403 Acute embolism and thrombosis of unspecified deep veins of lower extremity, bilateral: Secondary | ICD-10-CM

## 2013-02-25 DIAGNOSIS — Z5112 Encounter for antineoplastic immunotherapy: Secondary | ICD-10-CM | POA: Diagnosis not present

## 2013-02-25 LAB — COMPREHENSIVE METABOLIC PANEL (CC13)
BUN: 16.9 mg/dL (ref 7.0–26.0)
CO2: 26 mEq/L (ref 22–29)
Creatinine: 1 mg/dL (ref 0.6–1.1)
Glucose: 129 mg/dl — ABNORMAL HIGH (ref 70–99)
Total Bilirubin: 0.2 mg/dL (ref 0.20–1.20)

## 2013-02-25 LAB — CBC WITH DIFFERENTIAL/PLATELET
Eosinophils Absolute: 0.3 10*3/uL (ref 0.0–0.5)
HCT: 30.7 % — ABNORMAL LOW (ref 34.8–46.6)
HGB: 10.5 g/dL — ABNORMAL LOW (ref 11.6–15.9)
LYMPH%: 25.5 % (ref 14.0–49.7)
MONO#: 0.5 10*3/uL (ref 0.1–0.9)
NEUT#: 3.9 10*3/uL (ref 1.5–6.5)
NEUT%: 60.8 % (ref 38.4–76.8)
Platelets: 255 10*3/uL (ref 145–400)
WBC: 6.4 10*3/uL (ref 3.9–10.3)

## 2013-02-25 LAB — PROTIME-INR

## 2013-02-25 MED ORDER — ACETAMINOPHEN 325 MG PO TABS
650.0000 mg | ORAL_TABLET | Freq: Once | ORAL | Status: AC
  Administered 2013-02-25: 650 mg via ORAL

## 2013-02-25 MED ORDER — WARFARIN SODIUM 5 MG PO TABS: ORAL_TABLET | ORAL | Status: DC

## 2013-02-25 MED ORDER — DIPHENHYDRAMINE HCL 25 MG PO CAPS: 50.0000 mg | ORAL_CAPSULE | Freq: Once | ORAL | Status: DC

## 2013-02-25 MED ORDER — SODIUM CHLORIDE 0.9 % IV SOLN
Freq: Once | INTRAVENOUS | Status: AC
  Administered 2013-02-25: 15:00:00 via INTRAVENOUS

## 2013-02-25 MED ORDER — TRASTUZUMAB CHEMO INJECTION 440 MG
6.0000 mg/kg | Freq: Once | INTRAVENOUS | Status: AC
  Administered 2013-02-25: 378 mg via INTRAVENOUS
  Filled 2013-02-25: qty 18

## 2013-02-25 MED ORDER — HEPARIN SOD (PORK) LOCK FLUSH 100 UNIT/ML IV SOLN
500.0000 [IU] | Freq: Once | INTRAVENOUS | Status: AC | PRN
  Administered 2013-02-25: 500 [IU]
  Filled 2013-02-25: qty 5

## 2013-02-25 MED ORDER — SODIUM CHLORIDE 0.9 % IJ SOLN
10.0000 mL | INTRAMUSCULAR | Status: DC | PRN
  Administered 2013-02-25 (×2): 10 mL
  Filled 2013-02-25: qty 10

## 2013-02-25 NOTE — Patient Instructions (Addendum)
[facility] Discharge Instructions for Patients Receiving Chemotherapy  Today you received the following chemotherapy agents: herceptin  To help prevent nausea and vomiting after your treatment, we encourage you to take your nausea medication.  Take it as often as prescribed.     If you develop nausea and vomiting that is not controlled by your nausea medication, call the clinic. If it is after clinic hours your family physician or the after hours number for the clinic or go to the Emergency Department.   BELOW ARE SYMPTOMS THAT SHOULD BE REPORTED IMMEDIATELY:  *FEVER GREATER THAN 100.5 F  *CHILLS WITH OR WITHOUT FEVER  NAUSEA AND VOMITING THAT IS NOT CONTROLLED WITH YOUR NAUSEA MEDICATION  *UNUSUAL SHORTNESS OF BREATH  *UNUSUAL BRUISING OR BLEEDING  TENDERNESS IN MOUTH AND THROAT WITH OR WITHOUT PRESENCE OF ULCERS  *URINARY PROBLEMS  *BOWEL PROBLEMS  UNUSUAL RASH Items with * indicate a potential emergency and should be followed up as soon as possible.  Feel free to call the clinic you have any questions or concerns. The clinic phone number is [phone].   I have been informed and understand all the instructions given to me. I know to contact the clinic, my physician, or go to the Emergency Department if any problems should occur. I do not have any questions at this time, but understand that I may call the clinic during office hours   should I have any questions or need assistance in obtaining follow up care.    __________________________________________  _____________  __________ Signature of Patient or Authorized Representative            Date                   Time    __________________________________________ Nurse's Signature    

## 2013-02-25 NOTE — Telephone Encounter (Signed)
Patient with question regarding results of PT/INR, per MD informed patient results:  PT 25.2, INR 2.10  Per MD, pt to stop lovenox injections and to continue coumadin at 7.5 mg Tues, Thurs, Saturday and Coumadin 5 mg to be taken Monday, Wednesday, Friday, Sunday (patients preference, states she can remember this way better).  Prescription sent to patient's pharm.  Patient voiced verbal understanding, no further questions at this time.

## 2013-02-27 NOTE — Progress Notes (Signed)
Subjective:     Patient ID: Jessica Wells, female   DOB: July 22, 1946, 67 y.o.   MRN: [id number]  HPI  48 yof who presents today in follow up for right mrm for inflammatory breast cancer s/p chemotherapy. She had no residual disease. She has one drain remaining right now and presents today in follow up.  She is otherwise doing very well.  She is on her anticoagulation for recent vte also.  Review of Systems     Objective:   Physical Exam Right mastectomy incision clean without infection, drain with serous fluid    Assessment:     S/p right mrm     Plan:     Her drain is still putting too much out right now.  She will call on Monday and let me know how much drain is putting out.  It should come out next week.

## 2013-02-28 ENCOUNTER — Encounter (HOSPITAL_COMMUNITY): Payer: Self-pay

## 2013-02-28 ENCOUNTER — Telehealth (INDEPENDENT_AMBULATORY_CARE_PROVIDER_SITE_OTHER): Payer: Self-pay | Admitting: *Deleted

## 2013-02-28 ENCOUNTER — Ambulatory Visit (HOSPITAL_COMMUNITY)
Admission: RE | Admit: 2013-02-28 | Discharge: 2013-02-28 | Disposition: A | Discharge status: Home / Self Care | Payer: Medicare Other | Source: Ambulatory Visit | Attending: Adult Health | Admitting: Adult Health

## 2013-02-28 ENCOUNTER — Ambulatory Visit (HOSPITAL_COMMUNITY): Payer: Medicare Other

## 2013-02-28 DIAGNOSIS — M948X9 Other specified disorders of cartilage, unspecified sites: Secondary | ICD-10-CM | POA: Insufficient documentation

## 2013-02-28 DIAGNOSIS — IMO0002 Reserved for concepts with insufficient information to code with codable children: Secondary | ICD-10-CM | POA: Insufficient documentation

## 2013-02-28 DIAGNOSIS — Z79899 Other long term (current) drug therapy: Secondary | ICD-10-CM | POA: Insufficient documentation

## 2013-02-28 DIAGNOSIS — Z9886 Personal history of breast implant removal: Secondary | ICD-10-CM | POA: Diagnosis not present

## 2013-02-28 DIAGNOSIS — R918 Other nonspecific abnormal finding of lung field: Secondary | ICD-10-CM | POA: Insufficient documentation

## 2013-02-28 DIAGNOSIS — C3490 Malignant neoplasm of unspecified part of unspecified bronchus or lung: Secondary | ICD-10-CM

## 2013-02-28 DIAGNOSIS — K449 Diaphragmatic hernia without obstruction or gangrene: Secondary | ICD-10-CM | POA: Diagnosis not present

## 2013-02-28 DIAGNOSIS — J984 Other disorders of lung: Secondary | ICD-10-CM | POA: Diagnosis not present

## 2013-02-28 DIAGNOSIS — C50919 Malignant neoplasm of unspecified site of unspecified female breast: Secondary | ICD-10-CM | POA: Diagnosis not present

## 2013-02-28 DIAGNOSIS — Z9010 Acquired absence of unspecified breast and nipple: Secondary | ICD-10-CM | POA: Diagnosis not present

## 2013-02-28 DIAGNOSIS — Y836 Removal of other organ (partial) (total) as the cause of abnormal reaction of the patient, or of later complication, without mention of misadventure at the time of the procedure: Secondary | ICD-10-CM | POA: Insufficient documentation

## 2013-02-28 DIAGNOSIS — Z9221 Personal history of antineoplastic chemotherapy: Secondary | ICD-10-CM | POA: Diagnosis not present

## 2013-02-28 LAB — GLUCOSE, CAPILLARY: Glucose-Capillary: 115 mg/dL — ABNORMAL HIGH (ref 70–99)

## 2013-02-28 MED ORDER — IOHEXOL 300 MG/ML  SOLN
100.0000 mL | Freq: Once | INTRAMUSCULAR | Status: AC | PRN
  Administered 2013-02-28: 100 mL via INTRAVENOUS

## 2013-02-28 MED ORDER — FLUDEOXYGLUCOSE F - 18 (FDG) INJECTION: 18.5000 | Freq: Once | INTRAVENOUS | Status: AC | PRN

## 2013-02-28 MED ORDER — FLUDEOXYGLUCOSE F - 18 (FDG) INJECTION
18.5000 | Freq: Once | INTRAVENOUS | Status: AC | PRN
  Administered 2013-02-28: 18.5 via INTRAVENOUS

## 2013-02-28 NOTE — Telephone Encounter (Signed)
Patient called to schedule appt to have drain removed this week.  Patient reports it is draining about 30cc in a 24hr period.  Patient was told by Dwain Sarna MD that it has to be pulled this week to prevent infection.  Appt scheduled for Thursday morning.

## 2013-03-02 ENCOUNTER — Telehealth: Payer: Self-pay | Admitting: *Deleted

## 2013-03-02 NOTE — Telephone Encounter (Signed)
CALLED PATIENT TO INFORM OF [facility] APPT. FOR 03-16-13, LVM FOR A RETURN CALL.

## 2013-03-03 ENCOUNTER — Ambulatory Visit (INDEPENDENT_AMBULATORY_CARE_PROVIDER_SITE_OTHER): Payer: Medicare Other | Admitting: General Surgery

## 2013-03-03 ENCOUNTER — Encounter (INDEPENDENT_AMBULATORY_CARE_PROVIDER_SITE_OTHER): Payer: Self-pay | Admitting: General Surgery

## 2013-03-03 VITALS — BP 128/75 | HR 70 | Temp 98.3°F | Resp 12 | Ht 61.0 in | Wt 138.4 lb

## 2013-03-03 DIAGNOSIS — Z09 Encounter for follow-up examination after completed treatment for conditions other than malignant neoplasm: Secondary | ICD-10-CM

## 2013-03-03 NOTE — Progress Notes (Signed)
Subjective:     Patient ID: Jessica Wells, female   DOB: Sep 01, 1946, 67 y.o.   MRN: [id number]  HPI 67 yof who underwent right mrm for inflammatory breast cancer.  She has done well postoperatively.  Her axillary drain is still putting out a little over 30 cc per day and she already had fluid accumulate that required drainage.  She comes in today to discuss drain removal.   Review of Systems     Objective:   Physical Exam Healing right mastectomy incision, drain with serous fluid, no more axillary collection    Assessment:     S/p right mrm     Plan:     She is doing well.  I will leave drain over weekend and then have return early next week for removal.  She can take a shower.  She should be ready to start radiotherapy on the 16th as scheduled.

## 2013-03-04 ENCOUNTER — Ambulatory Visit: Payer: Medicare Other | Admitting: Adult Health

## 2013-03-04 ENCOUNTER — Other Ambulatory Visit: Payer: Medicare Other | Admitting: Lab

## 2013-03-04 ENCOUNTER — Ambulatory Visit: Payer: Medicare Other

## 2013-03-04 ENCOUNTER — Ambulatory Visit: Payer: Medicare Other | Admitting: Internal Medicine

## 2013-03-07 ENCOUNTER — Telehealth (INDEPENDENT_AMBULATORY_CARE_PROVIDER_SITE_OTHER): Payer: Self-pay

## 2013-03-07 NOTE — Telephone Encounter (Signed)
Pt calling to report that her drain had 40cc's on Sunday,30cc's on Saturday,and 35cc's on Friday. Per Dr Dwain Sarna make an appt for nurse only to have her drain removed on Tuesday. The pt understands.

## 2013-03-08 ENCOUNTER — Ambulatory Visit (INDEPENDENT_AMBULATORY_CARE_PROVIDER_SITE_OTHER): Payer: Medicare Other | Admitting: General Surgery

## 2013-03-08 ENCOUNTER — Encounter (INDEPENDENT_AMBULATORY_CARE_PROVIDER_SITE_OTHER): Payer: Self-pay | Admitting: General Surgery

## 2013-03-08 VITALS — BP 123/74 | HR 72 | Temp 98.0°F | Resp 16 | Ht 61.0 in | Wt 139.8 lb

## 2013-03-08 DIAGNOSIS — Z4889 Encounter for other specified surgical aftercare: Secondary | ICD-10-CM

## 2013-03-08 DIAGNOSIS — Z4803 Encounter for change or removal of drains: Secondary | ICD-10-CM

## 2013-03-08 NOTE — Patient Instructions (Signed)
Patient came in today to get her drain removed from [place] site. The patient forgot her paper work on the amount that is coming out, but she remember the amount from Friday 03-04-13 was 40 cc Saturday 03-05-13 was 35 cc Sunday 03-06-13 was 30cc Monday 03-07-13 was 35 cc Tuesday 03-08-13 was 30 cc. I  paged Dr Dwain Sarna about the amount that was coming out in the drain, he stated that the drain can come out today and if the patient is having more drainage build up in the breast she will need to call the office on Friday 03-11-13 and ask for Elease Hashimoto to page Dr Dwain Sarna and he stated that he will come over to see the patient and drain off fluid. Patient is aware of that. Elease Hashimoto will call patient back if she needs a follow up apt with Dr Dwain Sarna in the future

## 2013-03-11 ENCOUNTER — Ambulatory Visit: Payer: Medicare Other | Admitting: Adult Health

## 2013-03-11 ENCOUNTER — Other Ambulatory Visit: Payer: Medicare Other | Admitting: Lab

## 2013-03-14 ENCOUNTER — Encounter: Payer: Self-pay | Admitting: *Deleted

## 2013-03-14 NOTE — Progress Notes (Signed)
Location of Breast Cancer: right, 12 o'clock  Histology per Pathology Report: inv mammary, mammary carcinoma in situ  Receptor Status: ER(-), PR (-), Her2-neu (+)  Did patient present with symptoms (if so, please note symptoms) or was this found on screening mammography?: imaging s/p tx infection of breast  Past/Anticipated interventions by surgeon, if any: radical mastectomy including lymph nodes 02/08/13 Past/Anticipated interventions by medical oncology, if any: neoadjuvant chemo started on 09/17/12- FEC x 4 cycles, Gemcitabine/Herceptin x 5 cycles, completed 12/17/12, Herceptin x 12 weeks started on 01/14/13  Lymphedema issues, if any:  no  Pain issues, if any:    SAFETY ISSUES:  Prior radiation? no  Pacemaker/ICD? no  Possible current pregnancy? no  Is the patient on methotrexate? no  Current Complaints / other details:  Drain right breast removed 03/08/13

## 2013-03-16 ENCOUNTER — Ambulatory Visit
Admission: RE | Admit: 2013-03-16 | Discharge: 2013-03-16 | Disposition: A | Discharge status: Home / Self Care | Payer: Medicare Other | Source: Ambulatory Visit | Attending: Radiation Oncology | Admitting: Radiation Oncology

## 2013-03-16 ENCOUNTER — Encounter: Payer: Self-pay | Admitting: Radiation Oncology

## 2013-03-16 VITALS — BP 155/82 | HR 77 | Temp 98.4°F | Resp 20 | Wt 139.9 lb

## 2013-03-16 DIAGNOSIS — C50919 Malignant neoplasm of unspecified site of unspecified female breast: Secondary | ICD-10-CM | POA: Insufficient documentation

## 2013-03-16 DIAGNOSIS — Z79899 Other long term (current) drug therapy: Secondary | ICD-10-CM | POA: Diagnosis not present

## 2013-03-16 DIAGNOSIS — Z7901 Long term (current) use of anticoagulants: Secondary | ICD-10-CM | POA: Insufficient documentation

## 2013-03-16 DIAGNOSIS — C50111 Malignant neoplasm of central portion of right female breast: Secondary | ICD-10-CM

## 2013-03-16 DIAGNOSIS — Z9010 Acquired absence of unspecified breast and nipple: Secondary | ICD-10-CM | POA: Insufficient documentation

## 2013-03-16 DIAGNOSIS — C50911 Malignant neoplasm of unspecified site of right female breast: Secondary | ICD-10-CM
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
## 2013-03-16 NOTE — Progress Notes (Signed)
Please see the Nurse Progress Note in the MD Initial Consult Encounter for this patient. 

## 2013-03-16 NOTE — Progress Notes (Signed)
Department of Radiation Oncology  Phone:  [phone] Fax:        [phone]   Name: Jessica Wells MRN: [id number]  DOB: Apr 05, 1946  Date: 03/16/2013  Follow Up Visit Note  Diagnosis: Inflammatory breast cancer  Interval History: Jessica Wells presents today for routine followup.  She completed neoadjuvant chemotherapy. She was switched from the [facility] to taxol and herceptin then to gemcitabine and herceptin. She underwent a modified radical mastectomy on March 11 which showed no residual carcinoma. 11 benign lymph nodes were removed. He is recovered well from her surgery. It took a while to get her drains out. She has a hard and fluid collection in the upper outer quadrant of the right chest wall. This is been examined by surgery and no further dimension is planned at this time. She states she thinks this fluid collection is slightly better. It is sore but not painful. She's not having any fevers and has not noticed this area getting any more red. She has normal range of motion of her arms. She has not been to ABC class.  Allergies:  Allergies  Allergen Reactions  . Anesthetics, Amide Nausea And Vomiting    Projectile vomitting-Nausea- with 24hrs of dry heaves. With any anesthetics  . Bactrim (Sulfamethoxazole W-Trimethoprim) Other (See Comments)    Massive diarrhea, nausea vomiting, platelets dropped, hemoglobin dropped, admitted to hospital  . Sulfa Antibiotics Nausea And Vomiting and Other (See Comments)    Crazy feeling in head Life threatening reaction, decreased blood counts  . Codeine Nausea And Vomiting  . Codeine Phosphate Nausea And Vomiting  . Eggs Or Egg-Derived Products Hives    Medications:  Current Outpatient Prescriptions  Medication Sig Dispense Refill  . b complex vitamins tablet Take 1 tablet by mouth daily.      . calcium-vitamin D (OSCAL WITH D) 500-200 MG-UNIT per tablet Take 1 tablet by mouth every other day.       . fish oil-omega-3 fatty acids 1000 MG capsule  Take 2 g by mouth daily.      . Flaxseed, Linseed, (FLAX SEEDS PO) Take 1 tablet by mouth daily.      Marland Kitchen glucosamine-chondroitin 500-400 MG tablet Take 1 tablet by mouth daily.      Marland Kitchen lidocaine-prilocaine (EMLA) cream Apply topically as needed.  30 g  5  . metoprolol succinate (TOPROL-XL) 50 MG 24 hr tablet Take 50 mg by mouth daily before breakfast. Take with or immediately following a meal.      . Multiple Vitamin (MULTIVITAMIN WITH MINERALS) TABS Take 1 tablet by mouth every other day.       Marland Kitchen omeprazole (PRILOSEC) 20 MG capsule Take 20 mg by mouth daily. For heartburn      . ondansetron (ZOFRAN) 8 MG tablet Take 8 mg by mouth every 8 (eight) hours as needed for nausea.       Marland Kitchen PROVENTIL HFA 108 (90 BASE) MCG/ACT inhaler Inhale 2 puffs into the lungs every 4 (four) hours as needed.       . warfarin (COUMADIN) 5 MG tablet TAKE ONE TABLET BY MOUTH ONCE DAILY  60 tablet  0  . warfarin (COUMADIN) 5 MG tablet Patient to take 7.5mg  by mouth on days Tuesday, Thursday, Saturday and 5 mg by mouth on days Monday, Wednesday, Thursday and Sunday.  30 tablet  6   No current facility-administered medications for this encounter.    Physical Exam:  Filed Vitals:   03/16/13 1517  BP: 155/82  Pulse: 77  Temp: 98.4 F (36.9 C)  Resp: 20   she has a golf ball size fluid collection over the insertion of the pectoralis muscle in the upper outer quadrant of the right chest wall. This is not associated with any erythema. This is nontender to palpation.  IMPRESSION: Jessica Wells is a 67 y.o. female status post neoadjuvant chemotherapy and mastectomy  PLAN:  We discussed the role of radiation and decreasing local failures in patients who undergo inflammatory cancer. We discussed the need for comprehensive nodal radiation. We discussed 6 weeks of treatment as an outpatient. We discussed the possible side effects of treatment including but not limited to skin redness and increased complications with reconstruction. She  has signed informed consent. I like to give her another couple weeks to see if this fluid collection does resolve. We'll schedule her simulation for next week and plan on starting the following week. I've also given her information about our ABC class and encouraged her to enroll. I would limit her deep stretching exercises until this fluid collection has just resolved more.    Lurline Hare, MD

## 2013-03-17 NOTE — Addendum Note (Signed)
Encounter addended by: Glennie Hawk, RN on: 03/17/2013  3:53 PM<BR>     Documentation filed: Charges VN

## 2013-03-18 ENCOUNTER — Encounter: Payer: Self-pay | Admitting: Adult Health

## 2013-03-18 ENCOUNTER — Other Ambulatory Visit: Payer: Medicare Other | Admitting: Lab

## 2013-03-18 ENCOUNTER — Ambulatory Visit (HOSPITAL_BASED_OUTPATIENT_CLINIC_OR_DEPARTMENT_OTHER): Payer: Medicare Other | Admitting: Adult Health

## 2013-03-18 ENCOUNTER — Ambulatory Visit (HOSPITAL_BASED_OUTPATIENT_CLINIC_OR_DEPARTMENT_OTHER): Payer: Medicare Other

## 2013-03-18 ENCOUNTER — Other Ambulatory Visit (HOSPITAL_BASED_OUTPATIENT_CLINIC_OR_DEPARTMENT_OTHER): Payer: Medicare Other

## 2013-03-18 ENCOUNTER — Ambulatory Visit: Payer: Medicare Other | Admitting: Adult Health

## 2013-03-18 ENCOUNTER — Telehealth: Payer: Self-pay | Admitting: Oncology

## 2013-03-18 ENCOUNTER — Ambulatory Visit: Payer: Medicare Other

## 2013-03-18 VITALS — BP 167/82 | HR 81 | Temp 98.8°F | Resp 20 | Ht 61.0 in | Wt 139.3 lb

## 2013-03-18 DIAGNOSIS — I82409 Acute embolism and thrombosis of unspecified deep veins of unspecified lower extremity: Secondary | ICD-10-CM

## 2013-03-18 DIAGNOSIS — I82403 Acute embolism and thrombosis of unspecified deep veins of lower extremity, bilateral: Secondary | ICD-10-CM

## 2013-03-18 DIAGNOSIS — C773 Secondary and unspecified malignant neoplasm of axilla and upper limb lymph nodes: Secondary | ICD-10-CM | POA: Diagnosis not present

## 2013-03-18 DIAGNOSIS — R609 Edema, unspecified: Secondary | ICD-10-CM | POA: Diagnosis not present

## 2013-03-18 DIAGNOSIS — C50919 Malignant neoplasm of unspecified site of unspecified female breast: Secondary | ICD-10-CM | POA: Diagnosis not present

## 2013-03-18 DIAGNOSIS — Z5112 Encounter for antineoplastic immunotherapy: Secondary | ICD-10-CM

## 2013-03-18 DIAGNOSIS — C50119 Malignant neoplasm of central portion of unspecified female breast: Secondary | ICD-10-CM

## 2013-03-18 DIAGNOSIS — C50111 Malignant neoplasm of central portion of right female breast: Secondary | ICD-10-CM

## 2013-03-18 DIAGNOSIS — M7989 Other specified soft tissue disorders: Secondary | ICD-10-CM

## 2013-03-18 LAB — COMPREHENSIVE METABOLIC PANEL (CC13)
AST: 21 U/L (ref 5–34)
Alkaline Phosphatase: 60 U/L (ref 40–150)
BUN: 22.5 mg/dL (ref 7.0–26.0)
Calcium: 9.4 mg/dL (ref 8.4–10.4)
Chloride: 105 mEq/L (ref 98–107)
Creatinine: 0.9 mg/dL (ref 0.6–1.1)
Potassium: 4.1 mEq/L (ref 3.5–5.1)

## 2013-03-18 LAB — CBC WITH DIFFERENTIAL/PLATELET
BASO%: 0.5 % (ref 0.0–2.0)
Basophils Absolute: 0 10*3/uL (ref 0.0–0.1)
EOS%: 2.6 % (ref 0.0–7.0)
HGB: 10.7 g/dL — ABNORMAL LOW (ref 11.6–15.9)
MCH: 29.7 pg (ref 25.1–34.0)
MONO#: 0.4 10*3/uL (ref 0.1–0.9)
NEUT#: 3.7 10*3/uL (ref 1.5–6.5)
RDW: 13.6 % (ref 11.2–14.5)
WBC: 6.1 10*3/uL (ref 3.9–10.3)
lymph#: 1.7 10*3/uL (ref 0.9–3.3)

## 2013-03-18 LAB — PROTIME-INR
INR: 2 (ref 2.00–3.50)
Protime: 24 Seconds — ABNORMAL HIGH (ref 10.6–13.4)

## 2013-03-18 MED ORDER — ACETAMINOPHEN 325 MG PO TABS
650.0000 mg | ORAL_TABLET | Freq: Once | ORAL | Status: AC
  Administered 2013-03-18: 650 mg via ORAL

## 2013-03-18 MED ORDER — DIPHENHYDRAMINE HCL 25 MG PO CAPS: 50.0000 mg | ORAL_CAPSULE | Freq: Once | ORAL | Status: DC

## 2013-03-18 MED ORDER — SODIUM CHLORIDE 0.9 % IV SOLN
Freq: Once | INTRAVENOUS | Status: AC
  Administered 2013-03-18: 14:00:00 via INTRAVENOUS

## 2013-03-18 MED ORDER — HEPARIN SOD (PORK) LOCK FLUSH 100 UNIT/ML IV SOLN
500.0000 [IU] | Freq: Once | INTRAVENOUS | Status: AC | PRN
  Administered 2013-03-18: 500 [IU]
  Filled 2013-03-18: qty 5

## 2013-03-18 MED ORDER — TRASTUZUMAB CHEMO INJECTION 440 MG
6.0000 mg/kg | Freq: Once | INTRAVENOUS | Status: AC
  Administered 2013-03-18: 378 mg via INTRAVENOUS
  Filled 2013-03-18: qty 18

## 2013-03-18 MED ORDER — SODIUM CHLORIDE 0.9 % IJ SOLN
10.0000 mL | INTRAMUSCULAR | Status: DC | PRN
  Administered 2013-03-18: 10 mL
  Filled 2013-03-18: qty 10

## 2013-03-18 NOTE — Progress Notes (Signed)
[facility]  Telephone:([phone]) [phone] [phone]  OFFICE PROGRESS NOTE   PATIENT: Jessica Wells   DOB: [birth year]  MR#: [id number]  FAO#:[id number]   ON:GEXBMW,UXLKGMWNU STEWART, MD Jessica Hare, MD Jessica Loron, MD Jessica Meres, MD Jessica Wells. Jessica Sires, MD    DIAGNOSIS:  67 year-old [place], [place] woman with a recent diagnosis of inflammatory breast cancer.   PRIOR THERAPY: 1.  The patient was originally seen in clinic for new diagnosis of inflammatory breast cancer, she was referred by Dr. Emelia Wells. She had a mammogram performed on 07/14/2012 that showed an abnormality. That was biopsied and it showed an invasive mammary carcinoma with lymphovascular invasion grade 3 ER negative PR negative HER-2/neu positive.   2.  The patient had a MRI of the breasts performed on 07/19/2012.  The MRI showed diffuse right breast neoplasm on a large level I right axillary lymph nodes compatible with lymphatic spread.  The patient has had a biopsy of the right axillary lymph node that is compatible with invasive mammary carcinoma.   3.  The patient began neoadjuvant FEC 100 with day 2 Neulasta support on 07/23/2012.   4.   Weekly neoadjuvant Taxol and Herceptin started on 09/17/12.  The patient developed neuropathies, and Taxol was discontinued early.  She completed 7 weeks of Taxol/Herceptin combination therapy.  The patient received 2 weeks of Herceptin only.   5. Weekly Gemcitabine and Herceptin was started on 11/19/12 and completed on 12/17/2012 after 5 cycles.  6.  A regimen of Lasix 20 mg PO and Kdur 10 mEq was started on 01/07/2013 due to chronic lower extremity edema.  Medications are only to be taken if patient has a weight gain of 5 lbs or more in a day (daily weights).   7.  Anemia was treated with blood transfusions of 1 unit of PRBCs given on 01/11/2013 and another unit of PRBC's given 01/12/2013.   CURRENT THERAPY:  Herceptin every 3  weeks  INTERVAL HISTORY: [name] is doing well today.  She is doing well today.  She had some swelling in her left arm that was very similar to the right arm swelling she had associated with her right inflammatory breast cancer. She also has a lymph node in her left groin that hurts.  She is very concerned that she has recurred.  She did see Dr. Michell Wells and will proceed the radiation in a couple of weeks.  She saw Dr. Doreen Wells office this past Tuesday for her drain removal.  She denies fevers, nausea, vomiting, shortness of breath, pain, swelling, or any further concerns.    PAST MEDICAL HISTORY: Past Medical History  Diagnosis Date  . GERD (gastroesophageal reflux disease)   . Dysrhythmia     PAT-sees dr Jessica Wells meds  . Breast cancer 07/14/12    inflammatory right breast ca, ER/PR -  . Allergy   . PAT (paroxysmal atrial tachycardia)     hx  . Arthritis     osteopenia,knees  . PONV (postoperative nausea and vomiting) 09-13-12    severe, with Port-a-cath, was managed without PONV  . Clotting disorder     prothrombin gene mutation-heterozygous   . History of chemotherapy     Last dose to be 02-04-13.  Was rx'd with Herceptin & Gemzar  . DVT (deep venous thrombosis)   . Asthma     Uses Inhalers Proventil as needed  . Bursitis 01-25-13    Rt. shoulder- mildly affected now.  . History of blood transfusion 01-25-13  2 units 01-11-13  . Pneumonia 01-25-13    hx. 12-27-12-hospital stay x 9 days, now resolved.    PAST SURGICAL HISTORY: Past Surgical History  Procedure Laterality Date  . Dilation and curettage of uterus      x3  . Tonsillectomy    . Colonoscopy    . Portacath placement  07/21/2012    Procedure: INSERTION PORT-A-CATH;  Surgeon: Jessica Loron, MD;  Location: [facility];  Service: General;  Laterality: Left;/ Replacement done 10'13  . Insertion of vena cava filter  12/2012  . Giant cell tumor  01-25-13    removed Rt. forearm.  . Eye lid surgery       right( MD office)  . Mastectomy modified radical Right 02/08/2013    Procedure: MASTECTOMY MODIFIED RADICAL;  Surgeon: Jessica Loron, MD;  Location: [facility];  Service: General;  Laterality: Right;  . Breast surgery      FAMILY HISTORY: Family History  Problem Relation Age of Onset  . Diabetes Mother   . Heart disease Mother   . Hypertension Mother   . Hyperlipidemia Mother   . Breast cancer Mother 20    lobular breast cancer  . Heart disease Maternal Aunt   . Heart disease Maternal Uncle   . Heart disease Maternal Grandmother     died in her 22s from heart disease  . Lung cancer Father 58    adenocarcinoma  . Cancer Paternal Uncle     dx in mid 59s with a cancer in the leg, died late 60s; grandpaternal half uncle    SOCIAL HISTORY: History  Substance Use Topics  . Smoking status: Former Smoker -- 1.00 packs/day for 35 years    Types: Cigarettes    Quit date: 07/21/1979  . Smokeless tobacco: Never Used  . Alcohol Use: No    ALLERGIES: Allergies  Allergen Reactions  . Anesthetics, Amide Nausea And Vomiting    Projectile vomitting-Nausea- with 24hrs of dry heaves. With any anesthetics  . Bactrim (Sulfamethoxazole W-Trimethoprim) Other (See Comments)    Massive diarrhea, nausea vomiting, platelets dropped, hemoglobin dropped, admitted to hospital  . Sulfa Antibiotics Nausea And Vomiting and Other (See Comments)    Crazy feeling in head Life threatening reaction, decreased blood counts  . Codeine Nausea And Vomiting  . Codeine Phosphate Nausea And Vomiting  . Eggs Or Egg-Derived Products Hives     MEDICATIONS:  Current Outpatient Prescriptions  Medication Sig Dispense Refill  . b complex vitamins tablet Take 1 tablet by mouth daily.      . calcium-vitamin D (OSCAL WITH D) 500-200 MG-UNIT per tablet Take 1 tablet by mouth every other day.       . fish oil-omega-3 fatty acids 1000 MG capsule Take 2 g by mouth daily.      . Flaxseed, Linseed, (FLAX SEEDS PO)  Take 1 tablet by mouth daily.      Marland Kitchen glucosamine-chondroitin 500-400 MG tablet Take 1 tablet by mouth daily.      Marland Kitchen lidocaine-prilocaine (EMLA) cream Apply topically as needed.  30 g  5  . metoprolol succinate (TOPROL-XL) 50 MG 24 hr tablet Take 50 mg by mouth daily before breakfast. Take with or immediately following a meal.      . Multiple Vitamin (MULTIVITAMIN WITH MINERALS) TABS Take 1 tablet by mouth every other day.       Marland Kitchen omeprazole (PRILOSEC) 20 MG capsule Take 20 mg by mouth daily. For heartburn      . ondansetron (  ZOFRAN) 8 MG tablet Take 8 mg by mouth every 8 (eight) hours as needed for nausea.       Marland Kitchen PROVENTIL HFA 108 (90 BASE) MCG/ACT inhaler Inhale 2 puffs into the lungs every 4 (four) hours as needed.       . warfarin (COUMADIN) 5 MG tablet TAKE ONE TABLET BY MOUTH ONCE DAILY  60 tablet  0  . warfarin (COUMADIN) 5 MG tablet Patient to take 7.5mg  by mouth on days Tuesday, Thursday, Saturday and 5 mg by mouth on days Monday, Wednesday, Thursday and Sunday.  30 tablet  6   No current facility-administered medications for this visit.      REVIEW OF SYSTEMS: A 10 point review of systems was completed and is negative except as noted above.    PHYSICAL EXAMINATION: BP 167/82  Pulse 81  Temp(Src) 98.8 F (37.1 C)  Resp 20  Ht 5\' 1"  (1.549 m)  Wt 139 lb 4.8 oz (63.186 kg)  BMI 26.33 kg/m2     General appearance: Alert, cooperative, well nourished, no apparent distress Head:  Atraumatic, normocephalic Eyes: Conjunctivae/corneas clear, PERRLA, EOMI Nose: No drainage or sinus tenderness Neck: No adenopathy, supple, symmetrical, trachea midline, thyroid not enlarged, no tenderness Resp: Clear to auscultation bilaterally, diminished bibasilar breath sounds Cardio: Regular rate and rhythm, S1, S2 normal, no murmur, click, rub or gallop, left chest Port-A-Cath covered with EMLA cream Breasts:right mastectomy site healing well, seroma in right axillary tissue, drains in place.   Left breast not examined today GI: Soft, distended, non-tender, hypoactive bowel sounds, no organomegaly Extremities: Extremities normal, atraumatic, no cyanosis, LE edema, LLE (+2)  is larger than RLE (trace edema) Lymph nodes: Cervical, supraclavicular, and axillary nodes normal Neurologic: Grossly normal    ECOG FS:  Grade 2 - Symptomatic, but completely ambulatory  LAB RESULTS: Lab Results  Component Value Date   WBC 6.1 03/18/2013   NEUTROABS 3.7 03/18/2013   HGB 10.7* 03/18/2013   HCT 31.6* 03/18/2013   MCV 87.8 03/18/2013   PLT 209 03/18/2013      Chemistry      Component Value Date/Time   NA 139 02/25/2013 1433   NA 140 02/10/2013 0400   K 3.9 02/25/2013 1433   K 3.4* 02/10/2013 0400   CL 106 02/25/2013 1433   CL 106 02/10/2013 0400   CO2 26 02/25/2013 1433   CO2 26 02/10/2013 0400   BUN 16.9 02/25/2013 1433   BUN 11 02/10/2013 0400   CREATININE 1.0 02/25/2013 1433   CREATININE 0.84 02/10/2013 0400      Component Value Date/Time   CALCIUM 8.9 02/25/2013 1433   CALCIUM 8.7 02/10/2013 0400   ALKPHOS 54 02/25/2013 1433   ALKPHOS 66 01/28/2013 1630   AST 26 02/25/2013 1433   AST 36 01/28/2013 1630   ALT 39 02/25/2013 1433   ALT 55* 01/28/2013 1630   BILITOT 0.20 02/25/2013 1433   BILITOT 0.2* 01/28/2013 1630      Lab Results  Component Value Date   LABCA2 16 07/19/2012     RADIOGRAPHIC STUDIES: No results found.  ASSESSMENT: 67 y.o. [place], [place] woman with:  1.  Inflammatory right breast cancer with positive lymph nodes the tumor is ER negative PR negative HER-2/neu positive.  Patient is s/p neoadjuvant chemotherapy consisting of FEC q 2 weeks x 4 cycles.  Status post weekly Gemcitabine and Herceptin that was started on 11/19/12 and completed on 12/17/2012 after 5 cycles.   Herceptin Qweekly x 12 weeks started on  01/14/2013 and she will receive a Herceptin infusion today.  Dr. Dwain Sarna is scheduled to perform a right breast radical mastectomy on 02/08/2013.  There are plans for  radiation therapy with Dr. Michell Wells following surgery.     2.  Bilateral DVT and Bilateral PE  3.  Lower extremity edema   PLAN:  1.  Bilateral DVT and PE.  The patient has a positive Prothrombin Gene Mutation, heterozygous. S/p IVC filter inserted on  12/27/2012. She will continue on Coumadin and take 7.5 mg three nights per week and 5mg  4 nights per week.  We will check an INR on Friday. I will doppler the left arm to evaluate the swelling since she has a left chest wall port. I will see her next week to f/u on the swelling.   2. She will proceed with Herceptin.  She also will start radiation under the care of Dr. Michell Wells.    3. PET/CT is normal.  We will see how her left arm swelling does.     4. I will see her back on 4/18 for her next Herceptin.   All questions were answered.  The patient was encouraged to contact us in the interim with any problems, questions or concerns.   I spent 25 minutes counseling the patient face to face.  The total time spent in the appointment was 30 minutes.  Cherie Ouch Lyn Hollingshead, NP Medical Oncology [facility] Phone: [phone] 03/18/2013, 1:29 PM

## 2013-03-18 NOTE — Patient Instructions (Addendum)
Doing well.  Proceed with Herceptin.  Please call us if you have any questions or concerns.

## 2013-03-18 NOTE — Patient Instructions (Signed)
[facility] Discharge Instructions for Patients Receiving Chemotherapy  Today you received the following chemotherapy agents Herceptin To help prevent nausea and vomiting after your treatment, we encourage you to take your nausea medication as prescribed.  If you develop nausea and vomiting that is not controlled by your nausea medication, call the clinic. If it is after clinic hours your family physician or the after hours number for the clinic or go to the Emergency Department.   BELOW ARE SYMPTOMS THAT SHOULD BE REPORTED IMMEDIATELY:  *FEVER GREATER THAN 100.5 F  *CHILLS WITH OR WITHOUT FEVER  NAUSEA AND VOMITING THAT IS NOT CONTROLLED WITH YOUR NAUSEA MEDICATION  *UNUSUAL SHORTNESS OF BREATH  *UNUSUAL BRUISING OR BLEEDING  TENDERNESS IN MOUTH AND THROAT WITH OR WITHOUT PRESENCE OF ULCERS  *URINARY PROBLEMS  *BOWEL PROBLEMS  UNUSUAL RASH Items with * indicate a potential emergency and should be followed up as soon as possible.  One of the nurses will contact you 24 hours after your treatment. Please let the nurse know about any problems that you may have experienced. Feel free to call the clinic you have any questions or concerns. The clinic phone number is [phone].   I have been informed and understand all the instructions given to me. I know to contact the clinic, my physician, or go to the Emergency Department if any problems should occur. I do not have any questions at this time, but understand that I may call the clinic during office hours   should I have any questions or need assistance in obtaining follow up care.    __________________________________________  _____________  __________ Signature of Patient or Authorized Representative            Date                   Time    __________________________________________ Nurse's Signature    

## 2013-03-21 ENCOUNTER — Ambulatory Visit (HOSPITAL_BASED_OUTPATIENT_CLINIC_OR_DEPARTMENT_OTHER)
Admission: RE | Admit: 2013-03-21 | Discharge: 2013-03-21 | Disposition: A | Discharge status: Home / Self Care | Payer: Medicare Other | Source: Ambulatory Visit | Attending: Internal Medicine | Admitting: Internal Medicine

## 2013-03-21 ENCOUNTER — Encounter (HOSPITAL_COMMUNITY): Payer: Self-pay

## 2013-03-21 ENCOUNTER — Ambulatory Visit (HOSPITAL_COMMUNITY)
Admission: RE | Admit: 2013-03-21 | Discharge: 2013-03-21 | Disposition: A | Discharge status: Home / Self Care | Payer: Medicare Other | Source: Ambulatory Visit | Attending: Internal Medicine | Admitting: Internal Medicine

## 2013-03-21 VITALS — BP 108/70 | HR 80 | Wt 139.1 lb

## 2013-03-21 DIAGNOSIS — Z79899 Other long term (current) drug therapy: Secondary | ICD-10-CM | POA: Diagnosis not present

## 2013-03-21 DIAGNOSIS — M949 Disorder of cartilage, unspecified: Secondary | ICD-10-CM | POA: Insufficient documentation

## 2013-03-21 DIAGNOSIS — Z09 Encounter for follow-up examination after completed treatment for conditions other than malignant neoplasm: Secondary | ICD-10-CM

## 2013-03-21 DIAGNOSIS — K219 Gastro-esophageal reflux disease without esophagitis: Secondary | ICD-10-CM | POA: Diagnosis not present

## 2013-03-21 DIAGNOSIS — Z853 Personal history of malignant neoplasm of breast: Secondary | ICD-10-CM | POA: Diagnosis not present

## 2013-03-21 DIAGNOSIS — Z7901 Long term (current) use of anticoagulants: Secondary | ICD-10-CM | POA: Insufficient documentation

## 2013-03-21 DIAGNOSIS — M899 Disorder of bone, unspecified: Secondary | ICD-10-CM | POA: Diagnosis not present

## 2013-03-21 DIAGNOSIS — Z86718 Personal history of other venous thrombosis and embolism: Secondary | ICD-10-CM | POA: Insufficient documentation

## 2013-03-21 DIAGNOSIS — D6859 Other primary thrombophilia: Secondary | ICD-10-CM | POA: Insufficient documentation

## 2013-03-21 DIAGNOSIS — J45909 Unspecified asthma, uncomplicated: Secondary | ICD-10-CM | POA: Insufficient documentation

## 2013-03-21 DIAGNOSIS — C50119 Malignant neoplasm of central portion of unspecified female breast: Secondary | ICD-10-CM

## 2013-03-21 DIAGNOSIS — C50919 Malignant neoplasm of unspecified site of unspecified female breast: Secondary | ICD-10-CM | POA: Diagnosis not present

## 2013-03-21 DIAGNOSIS — C50911 Malignant neoplasm of unspecified site of right female breast: Secondary | ICD-10-CM

## 2013-03-21 DIAGNOSIS — Z9221 Personal history of antineoplastic chemotherapy: Secondary | ICD-10-CM | POA: Diagnosis not present

## 2013-03-21 NOTE — Progress Notes (Signed)
  Echocardiogram 2D Echocardiogram has been performed.  Jessica Wells 03/21/2013, 3:07 PM

## 2013-03-22 ENCOUNTER — Ambulatory Visit
Admission: RE | Admit: 2013-03-22 | Discharge: 2013-03-22 | Disposition: A | Discharge status: Home / Self Care | Payer: Medicare Other | Source: Ambulatory Visit | Attending: Radiation Oncology | Admitting: Radiation Oncology

## 2013-03-22 DIAGNOSIS — Z7901 Long term (current) use of anticoagulants: Secondary | ICD-10-CM | POA: Insufficient documentation

## 2013-03-22 DIAGNOSIS — R5383 Other fatigue: Secondary | ICD-10-CM | POA: Insufficient documentation

## 2013-03-22 DIAGNOSIS — R5381 Other malaise: Secondary | ICD-10-CM | POA: Insufficient documentation

## 2013-03-22 DIAGNOSIS — Z9221 Personal history of antineoplastic chemotherapy: Secondary | ICD-10-CM | POA: Insufficient documentation

## 2013-03-22 DIAGNOSIS — C50119 Malignant neoplasm of central portion of unspecified female breast: Secondary | ICD-10-CM | POA: Insufficient documentation

## 2013-03-22 DIAGNOSIS — Z510 Encounter for antineoplastic radiation therapy: Secondary | ICD-10-CM | POA: Diagnosis not present

## 2013-03-22 DIAGNOSIS — M79609 Pain in unspecified limb: Secondary | ICD-10-CM | POA: Diagnosis not present

## 2013-03-22 DIAGNOSIS — C50911 Malignant neoplasm of unspecified site of right female breast: Secondary | ICD-10-CM

## 2013-03-22 DIAGNOSIS — C50919 Malignant neoplasm of unspecified site of unspecified female breast: Secondary | ICD-10-CM | POA: Diagnosis not present

## 2013-03-22 NOTE — Progress Notes (Signed)
Name: Jessica Wells   MRN: [id number]  Date:  03/22/2013  DOB: [birth year]  Status:outpatient    DIAGNOSIS: Breast cancer.  CONSENT VERIFIED: yes   SET UP: Patient is setup supine   IMMOBILIZATION:  The following immobilization was used:Custom Moldable Pillow, breast board.   NARRATIVE: [name] was brought to the CT Simulation planning suite.  Identity was confirmed.  All relevant records and images related to the planned course of therapy were reviewed.  Then, the patient was positioned in a stable reproducible clinical set-up for radiation therapy.  Wires were placed to delineate the clinical extent of breast tissue. A wire was placed on the scar as well.  CT images were obtained.  An isocenter was placed. Skin markings were placed.  The CT images were loaded into the planning software where the target and avoidance structures were contoured.  The radiation prescription was entered and confirmed. The patient was discharged in stable condition and tolerated simulation well.    TREATMENT PLANNING NOTE:  Treatment planning then occurred. I have requested : MLC's, isodose plan, basic dose calculation  I personally designed and supervised the construction of 5 medically necessary complex treatment devices for the protection of critical normal structures including the lungs and contralateral breast as well as the immobilization device which is necessary for set up certainty.

## 2013-03-24 NOTE — Progress Notes (Signed)
Oncologist: Dr Welton Flakes General Surgeon: Dr Dwain Sarna  HPI: 67 year old female with a history of tachycardia, inflammatory right breast cancer with positive lymph nodes the tumor is ER negative PR negative HER-2/neu positive. She has completed FEC for 4 cycles then taxol and herceptin weekly for 12 weeks.  She continues on herceptin q3 weeks.  S/p right radical mastectomy 02/08/2013.    Admitted to [facility] 12/27/12 with increased dyspnea. Respiratory Failure. Bilateral pulmonary embolism and pulmonary infiltrates. Bilateral lower extremity DVT.  [place] filter placed while hospitalized. Discharged from [facility] 01/05/13.  ECHOS:  07/22/12 EF 60-65% 10.5 10/2012 EF  60-65% 10.7  01/19/13 EF 55-60% lateral S' 13.0 No evidence of RV strain.  03/21/13 EF 55-60% 10.5  She returns for follow up today.  She feels well.  Getting herceptin every 3 weeks.  She denies dyspnea, orthopnea, PND.  No chest pain.  Small amount of swelling in ankles.   Past Medical History  Diagnosis Date  . GERD (gastroesophageal reflux disease)   . Dysrhythmia     PAT-sees dr Laurence Compton meds  . Breast cancer 07/14/12    inflammatory right breast ca, ER/PR -  . Allergy   . PAT (paroxysmal atrial tachycardia)     hx  . Arthritis     osteopenia,knees  . PONV (postoperative nausea and vomiting) 09-13-12    severe, with Port-a-cath, was managed without PONV  . Clotting disorder     prothrombin gene mutation-heterozygous   . History of chemotherapy     Last dose to be 02-04-13.  Was rx'd with Herceptin & Gemzar  . DVT (deep venous thrombosis)   . Asthma     Uses Inhalers Proventil as needed  . Bursitis 01-25-13    Rt. shoulder- mildly affected now.  . History of blood transfusion 01-25-13    2 units 01-11-13  . Pneumonia 01-25-13    hx. 12-27-12-hospital stay x 9 days, now resolved.    Current Outpatient Prescriptions  Medication Sig Dispense Refill  . b complex vitamins tablet Take 1 tablet by mouth daily.      . calcium-vitamin D  (OSCAL WITH D) 500-200 MG-UNIT per tablet Take 1 tablet by mouth every other day.       . fish oil-omega-3 fatty acids 1000 MG capsule Take 2 g by mouth daily.      . Flaxseed, Linseed, (FLAX SEEDS PO) Take 1 tablet by mouth daily.      Marland Kitchen glucosamine-chondroitin 500-400 MG tablet Take 1 tablet by mouth daily.      Marland Kitchen lidocaine-prilocaine (EMLA) cream Apply topically as needed.  30 g  5  . metoprolol succinate (TOPROL-XL) 50 MG 24 hr tablet Take 50 mg by mouth daily before breakfast. Take with or immediately following a meal.      . Multiple Vitamin (MULTIVITAMIN WITH MINERALS) TABS Take 1 tablet by mouth every other day.       Marland Kitchen omeprazole (PRILOSEC) 20 MG capsule Take 20 mg by mouth daily. For heartburn      . ondansetron (ZOFRAN) 8 MG tablet Take 8 mg by mouth every 8 (eight) hours as needed for nausea.       Marland Kitchen PROVENTIL HFA 108 (90 BASE) MCG/ACT inhaler Inhale 2 puffs into the lungs every 4 (four) hours as needed.       . warfarin (COUMADIN) 5 MG tablet TAKE ONE TABLET BY MOUTH ONCE DAILY  60 tablet  0  . warfarin (COUMADIN) 5 MG tablet Patient to take 7.5mg  by mouth on  days Tuesday, Thursday, Saturday and 5 mg by mouth on days Monday, Wednesday, Thursday and Sunday.  30 tablet  6   No current facility-administered medications for this encounter.     Allergies  Allergen Reactions  . Anesthetics, Amide Nausea And Vomiting    Projectile vomitting-Nausea- with 24hrs of dry heaves. With any anesthetics  . Bactrim (Sulfamethoxazole W-Trimethoprim) Other (See Comments)    Massive diarrhea, nausea vomiting, platelets dropped, hemoglobin dropped, admitted to hospital  . Sulfa Antibiotics Nausea And Vomiting and Other (See Comments)    Crazy feeling in head Life threatening reaction, decreased blood counts  . Codeine Nausea And Vomiting  . Codeine Phosphate Nausea And Vomiting  . Eggs Or Egg-Derived Products Hives    History   Social History  . Marital Status: Single    Spouse Name: N/A     Number of Children: 1  . Years of Education: N/A   Occupational History  .      retired Hydrographic surveyor   Social History Main Topics  . Smoking status: Former Smoker -- 1.00 packs/day for 35 years    Types: Cigarettes    Quit date: 07/21/1979  . Smokeless tobacco: Never Used  . Alcohol Use: No  . Drug Use: No  . Sexually Active: Not Currently     Comment: menarche age 66, P 1, menopause age 41, HRT x 3-5 yrs, low dosage   Other Topics Concern  . Not on file   Social History Narrative  . No narrative on file    Family History  Problem Relation Age of Onset  . Diabetes Mother   . Heart disease Mother   . Hypertension Mother   . Hyperlipidemia Mother   . Breast cancer Mother 6    lobular breast cancer  . Heart disease Maternal Aunt   . Heart disease Maternal Uncle   . Heart disease Maternal Grandmother     died in her 50s from heart disease  . Lung cancer Father 65    adenocarcinoma  . Cancer Paternal Uncle     dx in mid 62s with a cancer in the leg, died late 39s; grandpaternal half uncle    PHYSICAL EXAM: Filed Vitals:   03/21/13 1457  BP: 108/70  Pulse: 80  Weight: 139 lb 1.6 oz (63.095 kg)  SpO2: 97%    General:  Well appearing. No respiratory difficulty HEENT: no Neck: supple. no JVD. Carotids 2+ bilat; no bruits. No lymphadenopathy or thryomegaly appreciated. Cor: PMI nondisplaced. Regular rate & rhythm. No rubs, gallops or murmurs. Lungs: clear Abdomen: soft, nontender, nondistended. No hepatosplenomegaly. No bruits or masses. Good bowel sounds. Extremities: no cyanosis, clubbing, rash, tr lower extremity edema.   Neuro: alert & oriented x 3, cranial nerves grossly intact. moves all 4 extremities w/o difficulty. Affect pleasant.      ASSESSMENT & PLAN:

## 2013-03-24 NOTE — Patient Instructions (Signed)
Repeat echo in 3 months. Continue Herceptin.

## 2013-03-24 NOTE — Assessment & Plan Note (Signed)
I reviewed echos personally. EF and Doppler parameters stable. No HF on exam. Continue Herceptin.   

## 2013-03-25 ENCOUNTER — Ambulatory Visit: Payer: Medicare Other

## 2013-03-25 ENCOUNTER — Other Ambulatory Visit: Payer: Medicare Other | Admitting: Lab

## 2013-03-25 ENCOUNTER — Ambulatory Visit (HOSPITAL_COMMUNITY)
Admission: RE | Admit: 2013-03-25 | Discharge: 2013-03-25 | Disposition: A | Discharge status: Home / Self Care | Payer: Medicare Other | Source: Ambulatory Visit | Attending: Oncology | Admitting: Oncology

## 2013-03-25 ENCOUNTER — Other Ambulatory Visit (HOSPITAL_BASED_OUTPATIENT_CLINIC_OR_DEPARTMENT_OTHER): Payer: Medicare Other

## 2013-03-25 ENCOUNTER — Encounter: Payer: Self-pay | Admitting: Adult Health

## 2013-03-25 ENCOUNTER — Ambulatory Visit: Payer: Medicare Other | Admitting: Adult Health

## 2013-03-25 ENCOUNTER — Ambulatory Visit (HOSPITAL_BASED_OUTPATIENT_CLINIC_OR_DEPARTMENT_OTHER): Payer: Medicare Other | Admitting: Adult Health

## 2013-03-25 ENCOUNTER — Telehealth: Payer: Self-pay | Admitting: Oncology

## 2013-03-25 VITALS — BP 156/74 | HR 75 | Temp 98.0°F | Resp 20 | Ht 61.0 in | Wt 138.1 lb

## 2013-03-25 DIAGNOSIS — C50919 Malignant neoplasm of unspecified site of unspecified female breast: Secondary | ICD-10-CM

## 2013-03-25 DIAGNOSIS — M7989 Other specified soft tissue disorders: Secondary | ICD-10-CM

## 2013-03-25 DIAGNOSIS — C773 Secondary and unspecified malignant neoplasm of axilla and upper limb lymph nodes: Secondary | ICD-10-CM | POA: Diagnosis not present

## 2013-03-25 DIAGNOSIS — I82403 Acute embolism and thrombosis of unspecified deep veins of lower extremity, bilateral: Secondary | ICD-10-CM

## 2013-03-25 DIAGNOSIS — Z86718 Personal history of other venous thrombosis and embolism: Secondary | ICD-10-CM

## 2013-03-25 DIAGNOSIS — C50119 Malignant neoplasm of central portion of unspecified female breast: Secondary | ICD-10-CM | POA: Diagnosis not present

## 2013-03-25 DIAGNOSIS — Z171 Estrogen receptor negative status [ER-]: Secondary | ICD-10-CM

## 2013-03-25 DIAGNOSIS — Z7901 Long term (current) use of anticoagulants: Secondary | ICD-10-CM

## 2013-03-25 DIAGNOSIS — C50111 Malignant neoplasm of central portion of right female breast: Secondary | ICD-10-CM

## 2013-03-25 LAB — CBC WITH DIFFERENTIAL/PLATELET
Basophils Absolute: 0 10*3/uL (ref 0.0–0.1)
EOS%: 2.9 % (ref 0.0–7.0)
HCT: 32.9 % — ABNORMAL LOW (ref 34.8–46.6)
HGB: 11 g/dL — ABNORMAL LOW (ref 11.6–15.9)
LYMPH%: 24.7 % (ref 14.0–49.7)
MCH: 29.2 pg (ref 25.1–34.0)
MCV: 87.5 fL (ref 79.5–101.0)
MONO%: 9.3 % (ref 0.0–14.0)
NEUT%: 62.3 % (ref 38.4–76.8)

## 2013-03-25 LAB — COMPREHENSIVE METABOLIC PANEL (CC13)
ALT: 23 U/L (ref 0–55)
BUN: 26.6 mg/dL — ABNORMAL HIGH (ref 7.0–26.0)
CO2: 26 mEq/L (ref 22–29)
Calcium: 9.4 mg/dL (ref 8.4–10.4)
Chloride: 107 mEq/L (ref 98–107)
Creatinine: 1 mg/dL (ref 0.6–1.1)

## 2013-03-25 NOTE — Progress Notes (Signed)
[facility]  Telephone:([phone]) [phone] [phone]) [phone]  OFFICE PROGRESS NOTE   PATIENT: Jessica Wells   DOB: 28-Oct-1946  MR#: [id number]  EXB#:[id number]   NU:[id number] STEWART, MD Lurline Hare, MD Emelia Loron, MD Arvilla Meres, MD Charlaine Dalton. Sherene Sires, MD    DIAGNOSIS:  67 year-old [place], [place] woman with a recent diagnosis of inflammatory breast cancer.   PRIOR THERAPY: 1.  The patient was originally seen in clinic for new diagnosis of inflammatory breast cancer, she was referred by Dr. Emelia Loron. She had a mammogram performed on 07/14/2012 that showed an abnormality. That was biopsied and it showed an invasive mammary carcinoma with lymphovascular invasion grade 3 ER negative PR negative HER-2/neu positive.   2.  The patient had a MRI of the breasts performed on 07/19/2012.  The MRI showed diffuse right breast neoplasm on a large level I right axillary lymph nodes compatible with lymphatic spread.  The patient has had a biopsy of the right axillary lymph node that is compatible with invasive mammary carcinoma.   3.  The patient began neoadjuvant FEC 100 with day 2 Neulasta support on 07/23/2012.   4.   Weekly neoadjuvant Taxol and Herceptin started on 09/17/12.  The patient developed neuropathies, and Taxol was discontinued early.  She completed 7 weeks of Taxol/Herceptin combination therapy.  The patient received 2 weeks of Herceptin only.   5. Weekly Gemcitabine and Herceptin was started on 11/19/12 and completed on 12/17/2012 after 5 cycles.  6.  A regimen of Lasix 20 mg PO and Kdur 10 mEq was started on 01/07/2013 due to chronic lower extremity edema.  Medications are only to be taken if patient has a weight gain of 5 lbs or more in a day (daily weights).   7.  Anemia was treated with blood transfusions of 1 unit of PRBCs given on 01/11/2013 and another unit of PRBC's given 01/12/2013.   CURRENT THERAPY:  Herceptin every 3  weeks  INTERVAL HISTORY: Ms. Sadiq is doing well today.  She is here for f/u of the swelling in her left arm.  It is slightly worse, but it doesn't hurt.  It is not erythematous, tender, she denies fevers, chills, shortness of breath, pain, or any other concerns.  Her Left inguinal lymph node is the stable from last week.  Otherwise, a 10 point ROS is neg.   PAST MEDICAL HISTORY: Past Medical History  Diagnosis Date  . GERD (gastroesophageal reflux disease)   . Dysrhythmia     PAT-sees dr Laurence Compton meds  . Breast cancer 07/14/12    inflammatory right breast ca, ER/PR -  . Allergy   . PAT (paroxysmal atrial tachycardia)     hx  . Arthritis     osteopenia,knees  . PONV (postoperative nausea and vomiting) 09-13-12    severe, with Port-a-cath, was managed without PONV  . Clotting disorder     prothrombin gene mutation-heterozygous   . History of chemotherapy     Last dose to be 02-04-13.  Was rx'd with Herceptin & Gemzar  . DVT (deep venous thrombosis)   . Asthma     Uses Inhalers Proventil as needed  . Bursitis 01-25-13    Rt. shoulder- mildly affected now.  . History of blood transfusion 01-25-13    2 units 01-11-13  . Pneumonia 01-25-13    hx. 12-27-12-hospital stay x 9 days, now resolved.    PAST SURGICAL HISTORY: Past Surgical History  Procedure Laterality Date  . Dilation and curettage  of uterus      x3  . Tonsillectomy    . Colonoscopy    . Portacath placement  07/21/2012    Procedure: INSERTION PORT-A-CATH;  Surgeon: Emelia Loron, MD;  Location: [facility];  Service: General;  Laterality: Left;/ Replacement done 10'13  . Insertion of vena cava filter  12/2012  . Giant cell tumor  01-25-13    removed Rt. forearm.  . Eye lid surgery      right( MD office)  . Mastectomy modified radical Right 02/08/2013    Procedure: MASTECTOMY MODIFIED RADICAL;  Surgeon: Emelia Loron, MD;  Location: [facility];  Service: General;  Laterality: Right;  . Breast surgery       FAMILY HISTORY: Family History  Problem Relation Age of Onset  . Diabetes Mother   . Heart disease Mother   . Hypertension Mother   . Hyperlipidemia Mother   . Breast cancer Mother 72    lobular breast cancer  . Heart disease Maternal Aunt   . Heart disease Maternal Uncle   . Heart disease Maternal Grandmother     died in her 45s from heart disease  . Lung cancer Father 23    adenocarcinoma  . Cancer Paternal Uncle     dx in mid 28s with a cancer in the leg, died late 56s; grandpaternal half uncle    SOCIAL HISTORY: History  Substance Use Topics  . Smoking status: Former Smoker -- 1.00 packs/day for 35 years    Types: Cigarettes    Quit date: 07/21/1979  . Smokeless tobacco: Never Used  . Alcohol Use: No    ALLERGIES: Allergies  Allergen Reactions  . Anesthetics, Amide Nausea And Vomiting    Projectile vomitting-Nausea- with 24hrs of dry heaves. With any anesthetics  . Bactrim (Sulfamethoxazole W-Trimethoprim) Other (See Comments)    Massive diarrhea, nausea vomiting, platelets dropped, hemoglobin dropped, admitted to hospital  . Sulfa Antibiotics Nausea And Vomiting and Other (See Comments)    Crazy feeling in head Life threatening reaction, decreased blood counts  . Codeine Nausea And Vomiting  . Codeine Phosphate Nausea And Vomiting  . Eggs Or Egg-Derived Products Hives     MEDICATIONS:  Current Outpatient Prescriptions  Medication Sig Dispense Refill  . b complex vitamins tablet Take 1 tablet by mouth daily.      . calcium-vitamin D (OSCAL WITH D) 500-200 MG-UNIT per tablet Take 1 tablet by mouth every other day.       . fish oil-omega-3 fatty acids 1000 MG capsule Take 2 g by mouth daily.      . Flaxseed, Linseed, (FLAX SEEDS PO) Take 1 tablet by mouth daily.      Marland Kitchen glucosamine-chondroitin 500-400 MG tablet Take 1 tablet by mouth daily.      Marland Kitchen lidocaine-prilocaine (EMLA) cream Apply topically as needed.  30 g  5  . metoprolol succinate (TOPROL-XL) 50  MG 24 hr tablet Take 50 mg by mouth daily before breakfast. Take with or immediately following a meal.      . Multiple Vitamin (MULTIVITAMIN WITH MINERALS) TABS Take 1 tablet by mouth every other day.       Marland Kitchen omeprazole (PRILOSEC) 20 MG capsule Take 20 mg by mouth daily. For heartburn      . ondansetron (ZOFRAN) 8 MG tablet Take 8 mg by mouth every 8 (eight) hours as needed for nausea.       Marland Kitchen PROVENTIL HFA 108 (90 BASE) MCG/ACT inhaler Inhale 2 puffs into the  lungs every 4 (four) hours as needed.       . warfarin (COUMADIN) 5 MG tablet TAKE ONE TABLET BY MOUTH ONCE DAILY  60 tablet  0  . warfarin (COUMADIN) 5 MG tablet Patient to take 7.5mg  by mouth on days Tuesday, Thursday, Saturday and 5 mg by mouth on days Monday, Wednesday, Thursday and Sunday.  30 tablet  6   No current facility-administered medications for this visit.      REVIEW OF SYSTEMS: A 10 point review of systems was completed and is negative except as noted above.    PHYSICAL EXAMINATION: BP 156/74  Pulse 75  Temp(Src) 98 F (36.7 C) (Oral)  Resp 20  Ht 5\' 1"  (1.549 m)  Wt 138 lb 1.6 oz (62.642 kg)  BMI 26.11 kg/m2     General appearance: Alert, cooperative, well nourished, no apparent distress Head:  Atraumatic, normocephalic Eyes: Conjunctivae/corneas clear, PERRLA, EOMI Nose: No drainage or sinus tenderness Neck: No adenopathy, supple, symmetrical, trachea midline, thyroid not enlarged, no tenderness Resp: Clear to auscultation bilaterally, diminished bibasilar breath sounds Cardio: Regular rate and rhythm, S1, S2 normal, no murmur, click, rub or gallop, left chest Port-A-Cath covered with EMLA cream Breasts:right mastectomy site healing well, seroma in right axillary tissue, drains in place.  Left breast not examined today GI: Soft, distended, non-tender, hypoactive bowel sounds, no organomegaly Extremities: Extremities normal, atraumatic, no cyanosis, LE edema, LLE (+2)  is larger than RLE (trace edema) Lymph  nodes: Cervical, supraclavicular, and axillary nodes normal Neurologic: Grossly normal    ECOG FS:  Grade 2 - Symptomatic, but completely ambulatory  LAB RESULTS: Lab Results  Component Value Date   WBC 5.0 03/25/2013   NEUTROABS 3.1 03/25/2013   HGB 11.0* 03/25/2013   HCT 32.9* 03/25/2013   MCV 87.5 03/25/2013   PLT 198 03/25/2013      Chemistry      Component Value Date/Time   NA 141 03/18/2013 1244   NA 140 02/10/2013 0400   K 4.1 03/18/2013 1244   K 3.4* 02/10/2013 0400   CL 105 03/18/2013 1244   CL 106 02/10/2013 0400   CO2 25 03/18/2013 1244   CO2 26 02/10/2013 0400   BUN 22.5 03/18/2013 1244   BUN 11 02/10/2013 0400   CREATININE 0.9 03/18/2013 1244   CREATININE 0.84 02/10/2013 0400      Component Value Date/Time   CALCIUM 9.4 03/18/2013 1244   CALCIUM 8.7 02/10/2013 0400   ALKPHOS 60 03/18/2013 1244   ALKPHOS 66 01/28/2013 1630   AST 21 03/18/2013 1244   AST 36 01/28/2013 1630   ALT 22 03/18/2013 1244   ALT 55* 01/28/2013 1630   BILITOT 0.22 03/18/2013 1244   BILITOT 0.2* 01/28/2013 1630      Lab Results  Component Value Date   LABCA2 16 07/19/2012     RADIOGRAPHIC STUDIES: No results found.  ASSESSMENT: 67 y.o. [place], [place] woman with:  1.  Inflammatory right breast cancer with positive lymph nodes the tumor is ER negative PR negative HER-2/neu positive.  Patient is s/p neoadjuvant chemotherapy consisting of FEC q 2 weeks x 4 cycles.  Status post weekly Gemcitabine and Herceptin that was started on 11/19/12 and completed on 12/17/2012 after 5 cycles.   Herceptin Qweekly x 12 weeks started on 01/14/2013 and she will receive a Herceptin infusion today.  Dr. Dwain Sarna is scheduled to perform a right breast radical mastectomy on 02/08/2013.  There are plans for radiation therapy with Dr. Michell Heinrich following surgery.  2.  Bilateral DVT and Bilateral PE  3.  Lower extremity edema   PLAN:  1.  Bilateral DVT and PE.  The patient has a positive Prothrombin Gene Mutation,  heterozygous. S/p IVC filter inserted on  12/27/2012. She will continue on Coumadin and take 7.5 mg three nights per week and 5mg  4 nights per week.  Her INR remains stable. She will hopefully have the doppler today to eval the left arm.    2. The left arm swelling is stable, we are awaiting   4. I will see her back on 5/9 for her next appt.   All questions were answered.  The patient was encouraged to contact us in the interim with any problems, questions or concerns.   I spent 25 minutes counseling the patient face to face.  The total time spent in the appointment was 30 minutes.  Cherie Ouch Lyn Hollingshead, NP Medical Oncology [facility] Phone: [phone] 03/25/2013, 10:20 AM

## 2013-03-25 NOTE — Progress Notes (Signed)
Left upper extremity venous duplex:  No evidence of DVT or superficial thrombosis.    

## 2013-03-25 NOTE — Patient Instructions (Addendum)
We will get a doppler of your left arm today.  Please call us if you have any questions or concerns.

## 2013-03-29 DIAGNOSIS — C50919 Malignant neoplasm of unspecified site of unspecified female breast: Secondary | ICD-10-CM | POA: Diagnosis not present

## 2013-03-30 ENCOUNTER — Ambulatory Visit
Admission: RE | Admit: 2013-03-30 | Discharge: 2013-03-30 | Disposition: A | Discharge status: Home / Self Care | Payer: Medicare Other | Source: Ambulatory Visit | Attending: Radiation Oncology | Admitting: Radiation Oncology

## 2013-03-30 ENCOUNTER — Encounter: Payer: Self-pay | Admitting: Radiation Oncology

## 2013-03-30 DIAGNOSIS — Z9221 Personal history of antineoplastic chemotherapy: Secondary | ICD-10-CM | POA: Diagnosis not present

## 2013-03-30 DIAGNOSIS — C50919 Malignant neoplasm of unspecified site of unspecified female breast: Secondary | ICD-10-CM | POA: Diagnosis not present

## 2013-03-30 DIAGNOSIS — Z510 Encounter for antineoplastic radiation therapy: Secondary | ICD-10-CM | POA: Diagnosis not present

## 2013-03-30 DIAGNOSIS — Z7901 Long term (current) use of anticoagulants: Secondary | ICD-10-CM | POA: Diagnosis not present

## 2013-03-30 DIAGNOSIS — R5383 Other fatigue: Secondary | ICD-10-CM | POA: Diagnosis not present

## 2013-03-30 DIAGNOSIS — M79609 Pain in unspecified limb: Secondary | ICD-10-CM | POA: Diagnosis not present

## 2013-03-30 DIAGNOSIS — C50119 Malignant neoplasm of central portion of unspecified female breast: Secondary | ICD-10-CM | POA: Diagnosis not present

## 2013-03-31 ENCOUNTER — Ambulatory Visit
Admission: RE | Admit: 2013-03-31 | Discharge: 2013-03-31 | Disposition: A | Discharge status: Home / Self Care | Payer: Medicare Other | Source: Ambulatory Visit | Attending: Radiation Oncology | Admitting: Radiation Oncology

## 2013-03-31 DIAGNOSIS — Z7901 Long term (current) use of anticoagulants: Secondary | ICD-10-CM | POA: Diagnosis not present

## 2013-03-31 DIAGNOSIS — C50919 Malignant neoplasm of unspecified site of unspecified female breast: Secondary | ICD-10-CM | POA: Diagnosis not present

## 2013-03-31 DIAGNOSIS — C50111 Malignant neoplasm of central portion of right female breast: Secondary | ICD-10-CM

## 2013-03-31 DIAGNOSIS — Z9221 Personal history of antineoplastic chemotherapy: Secondary | ICD-10-CM | POA: Diagnosis not present

## 2013-03-31 DIAGNOSIS — C50119 Malignant neoplasm of central portion of unspecified female breast: Secondary | ICD-10-CM | POA: Diagnosis not present

## 2013-03-31 DIAGNOSIS — Z510 Encounter for antineoplastic radiation therapy: Secondary | ICD-10-CM | POA: Diagnosis not present

## 2013-03-31 DIAGNOSIS — M79609 Pain in unspecified limb: Secondary | ICD-10-CM | POA: Diagnosis not present

## 2013-03-31 DIAGNOSIS — R5383 Other fatigue: Secondary | ICD-10-CM | POA: Diagnosis not present

## 2013-03-31 MED ORDER — RADIAPLEXRX EX GEL
Freq: Once | CUTANEOUS | Status: AC
  Administered 2013-03-31: 13:00:00 via TOPICAL

## 2013-03-31 MED ORDER — ALRA NON-METALLIC DEODORANT (RAD-ONC)
1.0000 "application " | Freq: Once | TOPICAL | Status: AC
  Administered 2013-03-31: 1 via TOPICAL

## 2013-03-31 NOTE — Progress Notes (Signed)
Post sim ed completed w/pt. Gave pt "Radiation and You" booklet w/all pertinent information marked and discussed, re: fatigue, skin irritation/care, nutrition, pain. Gave pt Radiaplex, ALra w/instructions for proper use. All questions answered.

## 2013-04-01 ENCOUNTER — Telehealth: Payer: Self-pay | Admitting: *Deleted

## 2013-04-01 ENCOUNTER — Ambulatory Visit (INDEPENDENT_AMBULATORY_CARE_PROVIDER_SITE_OTHER): Payer: Medicare Other | Admitting: General Surgery

## 2013-04-01 ENCOUNTER — Encounter (INDEPENDENT_AMBULATORY_CARE_PROVIDER_SITE_OTHER): Payer: Self-pay | Admitting: General Surgery

## 2013-04-01 ENCOUNTER — Ambulatory Visit
Admission: RE | Admit: 2013-04-01 | Discharge: 2013-04-01 | Disposition: A | Discharge status: Home / Self Care | Payer: Medicare Other | Source: Ambulatory Visit | Attending: Radiation Oncology | Admitting: Radiation Oncology

## 2013-04-01 VITALS — BP 148/82 | HR 78 | Resp 18 | Ht 61.0 in | Wt 138.0 lb

## 2013-04-01 DIAGNOSIS — Z09 Encounter for follow-up examination after completed treatment for conditions other than malignant neoplasm: Secondary | ICD-10-CM

## 2013-04-01 DIAGNOSIS — Z510 Encounter for antineoplastic radiation therapy: Secondary | ICD-10-CM | POA: Diagnosis not present

## 2013-04-01 DIAGNOSIS — C50119 Malignant neoplasm of central portion of unspecified female breast: Secondary | ICD-10-CM | POA: Diagnosis not present

## 2013-04-01 DIAGNOSIS — R5381 Other malaise: Secondary | ICD-10-CM | POA: Diagnosis not present

## 2013-04-01 DIAGNOSIS — Z7901 Long term (current) use of anticoagulants: Secondary | ICD-10-CM | POA: Diagnosis not present

## 2013-04-01 DIAGNOSIS — M79609 Pain in unspecified limb: Secondary | ICD-10-CM | POA: Diagnosis not present

## 2013-04-01 DIAGNOSIS — Z9221 Personal history of antineoplastic chemotherapy: Secondary | ICD-10-CM | POA: Diagnosis not present

## 2013-04-01 NOTE — Telephone Encounter (Signed)
Dr.[place] office called patient will be a little bit late coming for her 12-05 tx, will let therapist know,thanked her 11:40 AM

## 2013-04-03 NOTE — Progress Notes (Signed)
Subjective:     Patient ID: Jessica Wells, female   DOB: 06-10-1946, 67 y.o.   MRN: [id number]  HPI 15 yof with right breast inflammatory cancer underwent primary chemo with good response followed by right mrm.  She had to be put right back on anticoagulation afterwards.  She had a small hematoma laterally and still has that vs seroma near her axilla.  This is not really bothering her now and she thinks it is better.  Review of Systems     Objective:   Physical Exam Right mastectomy wound healed without infection, small seroma vs hematoma lateral to pec in axilla. Nontender without infection    Assessment:     S/p right mrm for inflammatory cancer    Plan:     She is doing well.  The small hematoma/seroma is not really symptomatic and she thinks is smaller.  I will just observe for now and she will begin radiation therapy.

## 2013-04-04 ENCOUNTER — Ambulatory Visit
Admission: RE | Admit: 2013-04-04 | Discharge: 2013-04-04 | Disposition: A | Discharge status: Home / Self Care | Payer: Medicare Other | Source: Ambulatory Visit | Attending: Radiation Oncology | Admitting: Radiation Oncology

## 2013-04-04 DIAGNOSIS — Z7901 Long term (current) use of anticoagulants: Secondary | ICD-10-CM | POA: Diagnosis not present

## 2013-04-04 DIAGNOSIS — C50119 Malignant neoplasm of central portion of unspecified female breast: Secondary | ICD-10-CM | POA: Diagnosis not present

## 2013-04-04 DIAGNOSIS — R5383 Other fatigue: Secondary | ICD-10-CM | POA: Diagnosis not present

## 2013-04-04 DIAGNOSIS — M79609 Pain in unspecified limb: Secondary | ICD-10-CM | POA: Diagnosis not present

## 2013-04-04 DIAGNOSIS — Z510 Encounter for antineoplastic radiation therapy: Secondary | ICD-10-CM | POA: Diagnosis not present

## 2013-04-04 DIAGNOSIS — Z9221 Personal history of antineoplastic chemotherapy: Secondary | ICD-10-CM | POA: Diagnosis not present

## 2013-04-05 ENCOUNTER — Ambulatory Visit
Admission: RE | Admit: 2013-04-05 | Discharge: 2013-04-05 | Disposition: A | Discharge status: Home / Self Care | Payer: Medicare Other | Source: Ambulatory Visit | Attending: Radiation Oncology | Admitting: Radiation Oncology

## 2013-04-05 VITALS — BP 154/72 | HR 66 | Temp 98.4°F | Ht 61.0 in | Wt 140.8 lb

## 2013-04-05 DIAGNOSIS — C50111 Malignant neoplasm of central portion of right female breast: Secondary | ICD-10-CM

## 2013-04-05 DIAGNOSIS — M79609 Pain in unspecified limb: Secondary | ICD-10-CM | POA: Diagnosis not present

## 2013-04-05 DIAGNOSIS — R5381 Other malaise: Secondary | ICD-10-CM | POA: Diagnosis not present

## 2013-04-05 DIAGNOSIS — Z7901 Long term (current) use of anticoagulants: Secondary | ICD-10-CM | POA: Diagnosis not present

## 2013-04-05 DIAGNOSIS — C50119 Malignant neoplasm of central portion of unspecified female breast: Secondary | ICD-10-CM | POA: Diagnosis not present

## 2013-04-05 DIAGNOSIS — Z9221 Personal history of antineoplastic chemotherapy: Secondary | ICD-10-CM | POA: Diagnosis not present

## 2013-04-05 DIAGNOSIS — Z510 Encounter for antineoplastic radiation therapy: Secondary | ICD-10-CM | POA: Diagnosis not present

## 2013-04-05 NOTE — Progress Notes (Signed)
Ms. Garmany here for weekly under treat visit.  She has had 4/25 fractions to her right breast.  She denies pain, nausea and fatigue.  The skin on her right chest is slightly pink.  She is using radiaplex gel twice a day.

## 2013-04-05 NOTE — Progress Notes (Signed)
Weekly Management Note Current Dose: 7.2  Gy  Projected Dose: 50.4  Gy   Narrative:  The patient presents for routine under treatment assessment.  CBCT/MVCT images/Port film x-rays were reviewed.  The chart was checked. Doing well. Using radiaplex.  Physical Findings: Weight: 140 lb 12.8 oz (63.866 kg). Unchanged  Impression:  The patient is tolerating radiation.  Plan:  Continue treatment as planned.

## 2013-04-06 ENCOUNTER — Ambulatory Visit
Admission: RE | Admit: 2013-04-06 | Discharge: 2013-04-06 | Disposition: A | Discharge status: Home / Self Care | Payer: Medicare Other | Source: Ambulatory Visit | Attending: Radiation Oncology | Admitting: Radiation Oncology

## 2013-04-06 DIAGNOSIS — Z9221 Personal history of antineoplastic chemotherapy: Secondary | ICD-10-CM | POA: Diagnosis not present

## 2013-04-06 DIAGNOSIS — Z510 Encounter for antineoplastic radiation therapy: Secondary | ICD-10-CM | POA: Diagnosis not present

## 2013-04-06 DIAGNOSIS — C50119 Malignant neoplasm of central portion of unspecified female breast: Secondary | ICD-10-CM | POA: Diagnosis not present

## 2013-04-06 DIAGNOSIS — R5381 Other malaise: Secondary | ICD-10-CM | POA: Diagnosis not present

## 2013-04-06 DIAGNOSIS — Z7901 Long term (current) use of anticoagulants: Secondary | ICD-10-CM | POA: Diagnosis not present

## 2013-04-06 DIAGNOSIS — M79609 Pain in unspecified limb: Secondary | ICD-10-CM | POA: Diagnosis not present

## 2013-04-07 ENCOUNTER — Ambulatory Visit
Admission: RE | Admit: 2013-04-07 | Discharge: 2013-04-07 | Disposition: A | Discharge status: Home / Self Care | Payer: Medicare Other | Source: Ambulatory Visit | Attending: Radiation Oncology | Admitting: Radiation Oncology

## 2013-04-07 DIAGNOSIS — C50919 Malignant neoplasm of unspecified site of unspecified female breast: Secondary | ICD-10-CM | POA: Diagnosis not present

## 2013-04-07 DIAGNOSIS — Z9221 Personal history of antineoplastic chemotherapy: Secondary | ICD-10-CM | POA: Diagnosis not present

## 2013-04-07 DIAGNOSIS — Z510 Encounter for antineoplastic radiation therapy: Secondary | ICD-10-CM | POA: Diagnosis not present

## 2013-04-07 DIAGNOSIS — C50119 Malignant neoplasm of central portion of unspecified female breast: Secondary | ICD-10-CM | POA: Diagnosis not present

## 2013-04-07 DIAGNOSIS — Z7901 Long term (current) use of anticoagulants: Secondary | ICD-10-CM | POA: Diagnosis not present

## 2013-04-07 DIAGNOSIS — R5381 Other malaise: Secondary | ICD-10-CM | POA: Diagnosis not present

## 2013-04-07 DIAGNOSIS — M79609 Pain in unspecified limb: Secondary | ICD-10-CM | POA: Diagnosis not present

## 2013-04-08 ENCOUNTER — Ambulatory Visit
Admission: RE | Admit: 2013-04-08 | Discharge: 2013-04-08 | Disposition: A | Discharge status: Home / Self Care | Payer: Medicare Other | Source: Ambulatory Visit | Attending: Radiation Oncology | Admitting: Radiation Oncology

## 2013-04-08 ENCOUNTER — Other Ambulatory Visit (HOSPITAL_BASED_OUTPATIENT_CLINIC_OR_DEPARTMENT_OTHER): Payer: Medicare Other

## 2013-04-08 ENCOUNTER — Telehealth: Payer: Self-pay | Admitting: *Deleted

## 2013-04-08 ENCOUNTER — Ambulatory Visit (HOSPITAL_BASED_OUTPATIENT_CLINIC_OR_DEPARTMENT_OTHER): Payer: Medicare Other | Admitting: Adult Health

## 2013-04-08 ENCOUNTER — Ambulatory Visit (HOSPITAL_BASED_OUTPATIENT_CLINIC_OR_DEPARTMENT_OTHER): Payer: Medicare Other

## 2013-04-08 ENCOUNTER — Encounter: Payer: Self-pay | Admitting: Adult Health

## 2013-04-08 VITALS — BP 142/84 | HR 72 | Temp 98.4°F | Resp 20 | Ht 61.0 in | Wt 138.4 lb

## 2013-04-08 DIAGNOSIS — Z5112 Encounter for antineoplastic immunotherapy: Secondary | ICD-10-CM

## 2013-04-08 DIAGNOSIS — R5381 Other malaise: Secondary | ICD-10-CM | POA: Diagnosis not present

## 2013-04-08 DIAGNOSIS — R609 Edema, unspecified: Secondary | ICD-10-CM

## 2013-04-08 DIAGNOSIS — C773 Secondary and unspecified malignant neoplasm of axilla and upper limb lymph nodes: Secondary | ICD-10-CM

## 2013-04-08 DIAGNOSIS — C50119 Malignant neoplasm of central portion of unspecified female breast: Secondary | ICD-10-CM | POA: Diagnosis not present

## 2013-04-08 DIAGNOSIS — C50919 Malignant neoplasm of unspecified site of unspecified female breast: Secondary | ICD-10-CM

## 2013-04-08 DIAGNOSIS — Z7901 Long term (current) use of anticoagulants: Secondary | ICD-10-CM | POA: Diagnosis not present

## 2013-04-08 DIAGNOSIS — Z9221 Personal history of antineoplastic chemotherapy: Secondary | ICD-10-CM | POA: Diagnosis not present

## 2013-04-08 DIAGNOSIS — C50111 Malignant neoplasm of central portion of right female breast: Secondary | ICD-10-CM

## 2013-04-08 DIAGNOSIS — I82403 Acute embolism and thrombosis of unspecified deep veins of lower extremity, bilateral: Secondary | ICD-10-CM

## 2013-04-08 DIAGNOSIS — I82409 Acute embolism and thrombosis of unspecified deep veins of unspecified lower extremity: Secondary | ICD-10-CM

## 2013-04-08 DIAGNOSIS — M79609 Pain in unspecified limb: Secondary | ICD-10-CM | POA: Diagnosis not present

## 2013-04-08 DIAGNOSIS — Z510 Encounter for antineoplastic radiation therapy: Secondary | ICD-10-CM | POA: Diagnosis not present

## 2013-04-08 LAB — CBC WITH DIFFERENTIAL/PLATELET
BASO%: 0.4 % (ref 0.0–2.0)
HCT: 31.3 % — ABNORMAL LOW (ref 34.8–46.6)
HGB: 10.7 g/dL — ABNORMAL LOW (ref 11.6–15.9)
MCHC: 34.2 g/dL (ref 31.5–36.0)
MONO#: 0.3 10*3/uL (ref 0.1–0.9)
NEUT%: 68.8 % (ref 38.4–76.8)
WBC: 4.9 10*3/uL (ref 3.9–10.3)
lymph#: 1.1 10*3/uL (ref 0.9–3.3)

## 2013-04-08 LAB — COMPREHENSIVE METABOLIC PANEL (CC13)
ALT: 20 U/L (ref 0–55)
AST: 19 U/L (ref 5–34)
Alkaline Phosphatase: 64 U/L (ref 40–150)
CO2: 27 mEq/L (ref 22–29)
Sodium: 142 mEq/L (ref 136–145)
Total Bilirubin: 0.28 mg/dL (ref 0.20–1.20)
Total Protein: 6.9 g/dL (ref 6.4–8.3)

## 2013-04-08 LAB — PROTIME-INR: INR: 1.7 — ABNORMAL LOW (ref 2.00–3.50)

## 2013-04-08 MED ORDER — TRASTUZUMAB CHEMO INJECTION 440 MG
6.0000 mg/kg | Freq: Once | INTRAVENOUS | Status: AC
  Administered 2013-04-08: 378 mg via INTRAVENOUS
  Filled 2013-04-08: qty 18

## 2013-04-08 MED ORDER — HEPARIN SOD (PORK) LOCK FLUSH 100 UNIT/ML IV SOLN
500.0000 [IU] | Freq: Once | INTRAVENOUS | Status: AC | PRN
  Administered 2013-04-08: 500 [IU]
  Filled 2013-04-08: qty 5

## 2013-04-08 MED ORDER — ACETAMINOPHEN 325 MG PO TABS
650.0000 mg | ORAL_TABLET | Freq: Once | ORAL | Status: AC
  Administered 2013-04-08: 650 mg via ORAL

## 2013-04-08 MED ORDER — SODIUM CHLORIDE 0.9 % IJ SOLN
10.0000 mL | INTRAMUSCULAR | Status: DC | PRN
  Administered 2013-04-08: 10 mL
  Filled 2013-04-08: qty 10

## 2013-04-08 MED ORDER — SODIUM CHLORIDE 0.9 % IV SOLN
Freq: Once | INTRAVENOUS | Status: AC
  Administered 2013-04-08: 15:00:00 via INTRAVENOUS

## 2013-04-08 NOTE — Patient Instructions (Signed)
[facility] Discharge Instructions for Patients Receiving Chemotherapy  Today you received the following chemotherapy agents: herceptin  To help prevent nausea and vomiting after your treatment, we encourage you to take your nausea medication.  Take it as often as prescribed.     If you develop nausea and vomiting that is not controlled by your nausea medication, call the clinic. If it is after clinic hours your family physician or the after hours number for the clinic or go to the Emergency Department.   BELOW ARE SYMPTOMS THAT SHOULD BE REPORTED IMMEDIATELY:  *FEVER GREATER THAN 100.5 F  *CHILLS WITH OR WITHOUT FEVER  NAUSEA AND VOMITING THAT IS NOT CONTROLLED WITH YOUR NAUSEA MEDICATION  *UNUSUAL SHORTNESS OF BREATH  *UNUSUAL BRUISING OR BLEEDING  TENDERNESS IN MOUTH AND THROAT WITH OR WITHOUT PRESENCE OF ULCERS  *URINARY PROBLEMS  *BOWEL PROBLEMS  UNUSUAL RASH Items with * indicate a potential emergency and should be followed up as soon as possible.  One of the nurses will contact you 24 hours after your treatment. Please let the nurse know about any problems that you may have experienced. Feel free to call the clinic you have any questions or concerns. The clinic phone number is [phone].   I have been informed and understand all the instructions given to me. I know to contact the clinic, my physician, or go to the Emergency Department if any problems should occur. I do not have any questions at this time, but understand that I may call the clinic during office hours   should I have any questions or need assistance in obtaining follow up care.    __________________________________________  _____________  __________ Signature of Patient or Authorized Representative            Date                   Time    __________________________________________ Nurse's Signature   

## 2013-04-08 NOTE — Telephone Encounter (Signed)
appts made and printed. Pt is aware that her tx will be added at a later time if not before she finish in the tx area.Marland Kitchentd

## 2013-04-08 NOTE — Patient Instructions (Signed)
Doing well.  Proceed with Herceptin.  Please call us if you have any questions or concerns.    

## 2013-04-08 NOTE — Progress Notes (Signed)
[facility]  Telephone:([phone]) [phone] Fax:(336) [phone]  OFFICE PROGRESS NOTE   PATIENT: Jessica Wells   DOB: [birth year]  MR#: [id number]  MWU#:[id number]   VO:[id number] STEWART, MD Lurline Hare, MD Emelia Loron, MD Arvilla Meres, MD Charlaine Dalton. Sherene Sires, MD    DIAGNOSIS:  66 year-old [place], [place] woman with a recent diagnosis of inflammatory breast cancer.   PRIOR THERAPY: 1.  The patient was originally seen in clinic for new diagnosis of inflammatory breast cancer, she was referred by Dr. Emelia Loron. She had a mammogram performed on 07/14/2012 that showed an abnormality. That was biopsied and it showed an invasive mammary carcinoma with lymphovascular invasion grade 3 ER negative PR negative HER-2/neu positive.   2.  The patient had a MRI of the breasts performed on 07/19/2012.  The MRI showed diffuse right breast neoplasm on a large level I right axillary lymph nodes compatible with lymphatic spread.  The patient has had a biopsy of the right axillary lymph node that is compatible with invasive mammary carcinoma.   3.  The patient began neoadjuvant FEC 100 with day 2 Neulasta support on 07/23/2012.   4.   Weekly neoadjuvant Taxol and Herceptin started on 09/17/12.  The patient developed neuropathies, and Taxol was discontinued early.  She completed 7 weeks of Taxol/Herceptin combination therapy.  The patient received 2 weeks of Herceptin only.   5. Weekly Gemcitabine and Herceptin was started on 11/19/12 and completed on 12/17/2012 after 5 cycles.  6.  A regimen of Lasix 20 mg PO and Kdur 10 mEq was started on 01/07/2013 due to chronic lower extremity edema.  Medications are only to be taken if patient has a weight gain of 5 lbs or more in a day (daily weights).   7.  Anemia was treated with blood transfusions of 1 unit of PRBCs given on 01/11/2013 and another unit of PRBC's given 01/12/2013.  8. Patient underwent right mastectomy on  02/08/13.  Pathology showed a complete response to chemotherapy in the breast and 11 nodes identified with fibrosis but no carcinoma.    9.  Patient began radiation therapy on 03/31/13.    10.  Every 3 week herceptin started 02/25/13.    CURRENT THERAPY:  Herceptin every 3 weeks  INTERVAL HISTORY: Jessica Wells is doing well today.  She is here for her every 3 week Herceptin.  She started radiation therapy on May 1.  She is tolerating it well thus far.  She however continues to have left arm swelling in her lower left forearm, and pain is now extending into her wrist.  Otherwise, she is feeling well and a 10 point ROS is neg.   PAST MEDICAL HISTORY: Past Medical History  Diagnosis Date  . GERD (gastroesophageal reflux disease)   . Dysrhythmia     PAT-sees dr Laurence Compton meds  . Breast cancer 07/14/12    inflammatory right breast ca, ER/PR -  . Allergy   . PAT (paroxysmal atrial tachycardia)     hx  . Arthritis     osteopenia,knees  . PONV (postoperative nausea and vomiting) 09-13-12    severe, with Port-a-cath, was managed without PONV  . Clotting disorder     prothrombin gene mutation-heterozygous   . History of chemotherapy     Last dose to be 02-04-13.  Was rx'd with Herceptin & Gemzar  . DVT (deep venous thrombosis)   . Asthma     Uses Inhalers Proventil as needed  . Bursitis 01-25-13  Rt. shoulder- mildly affected now.  . History of blood transfusion 01-25-13    2 units 01-11-13  . Pneumonia 01-25-13    hx. 12-27-12-hospital stay x 9 days, now resolved.    PAST SURGICAL HISTORY: Past Surgical History  Procedure Laterality Date  . Dilation and curettage of uterus      x3  . Tonsillectomy    . Colonoscopy    . Portacath placement  07/21/2012    Procedure: INSERTION PORT-A-CATH;  Surgeon: Emelia Loron, MD;  Location: [facility];  Service: General;  Laterality: Left;/ Replacement done 10'13  . Insertion of vena cava filter  12/2012  . Giant cell tumor  01-25-13     removed Rt. forearm.  . Eye lid surgery      right( MD office)  . Mastectomy modified radical Right 02/08/2013    Procedure: MASTECTOMY MODIFIED RADICAL;  Surgeon: Emelia Loron, MD;  Location: [facility];  Service: General;  Laterality: Right;  . Breast surgery      FAMILY HISTORY: Family History  Problem Relation Age of Onset  . Diabetes Mother   . Heart disease Mother   . Hypertension Mother   . Hyperlipidemia Mother   . Breast cancer Mother 57    lobular breast cancer  . Heart disease Maternal Aunt   . Heart disease Maternal Uncle   . Heart disease Maternal Grandmother     died in her 48s from heart disease  . Lung cancer Father 25    adenocarcinoma  . Cancer Paternal Uncle     dx in mid 82s with a cancer in the leg, died late 19s; grandpaternal half uncle    SOCIAL HISTORY: History  Substance Use Topics  . Smoking status: Former Smoker -- 1.00 packs/day for 35 years    Types: Cigarettes    Quit date: 07/21/1979  . Smokeless tobacco: Never Used  . Alcohol Use: No    ALLERGIES: Allergies  Allergen Reactions  . Anesthetics, Amide Nausea And Vomiting    Projectile vomitting-Nausea- with 24hrs of dry heaves. With any anesthetics  . Bactrim (Sulfamethoxazole W-Trimethoprim) Other (See Comments)    Massive diarrhea, nausea vomiting, platelets dropped, hemoglobin dropped, admitted to hospital  . Sulfa Antibiotics Nausea And Vomiting and Other (See Comments)    Crazy feeling in head Life threatening reaction, decreased blood counts  . Codeine Nausea And Vomiting  . Codeine Phosphate Nausea And Vomiting  . Eggs Or Egg-Derived Products Hives     MEDICATIONS:  Current Outpatient Prescriptions  Medication Sig Dispense Refill  . b complex vitamins tablet Take 1 tablet by mouth daily.      . calcium-vitamin D (OSCAL WITH D) 500-200 MG-UNIT per tablet Take 1 tablet by mouth every other day.       . fish oil-omega-3 fatty acids 1000 MG capsule Take 2 g by mouth daily.       . Flaxseed, Linseed, (FLAX SEEDS PO) Take 1 tablet by mouth daily.      Marland Kitchen glucosamine-chondroitin 500-400 MG tablet Take 1 tablet by mouth daily.      . hyaluronate sodium (RADIAPLEXRX) GEL Apply topically 2 (two) times daily.      Marland Kitchen lidocaine-prilocaine (EMLA) cream Apply topically as needed.  30 g  5  . metoprolol succinate (TOPROL-XL) 50 MG 24 hr tablet Take 50 mg by mouth daily before breakfast. Take with or immediately following a meal.      . Multiple Vitamin (MULTIVITAMIN WITH MINERALS) TABS Take 1 tablet by  mouth every other day.       . non-metallic deodorant Thornton Papas) MISC Apply 1 application topically daily as needed.      Marland Kitchen omeprazole (PRILOSEC) 20 MG capsule Take 20 mg by mouth daily. For heartburn      . ondansetron (ZOFRAN) 8 MG tablet Take 8 mg by mouth every 8 (eight) hours as needed for nausea.       Marland Kitchen PROVENTIL HFA 108 (90 BASE) MCG/ACT inhaler Inhale 2 puffs into the lungs every 4 (four) hours as needed.       . warfarin (COUMADIN) 5 MG tablet TAKE ONE TABLET BY MOUTH ONCE DAILY  60 tablet  0  . warfarin (COUMADIN) 5 MG tablet Patient to take 7.5mg  by mouth on days Tuesday, Thursday, Saturday and 5 mg by mouth on days Monday, Wednesday, Thursday and Sunday.  30 tablet  6   No current facility-administered medications for this visit.      REVIEW OF SYSTEMS: A 10 point review of systems was completed and is negative except as noted above.    PHYSICAL EXAMINATION: BP 142/84  Pulse 72  Temp(Src) 98.4 F (36.9 C) (Oral)  Resp 20  Ht 5\' 1"  (1.549 m)  Wt 138 lb 6.4 oz (62.778 kg)  BMI 26.16 kg/m2     General appearance: Alert, cooperative, well nourished, no apparent distress Head:  Atraumatic, normocephalic Eyes: Conjunctivae/corneas clear, PERRLA, EOMI Nose: No drainage or sinus tenderness Neck: No adenopathy, supple, symmetrical, trachea midline, thyroid not enlarged, no tenderness Resp: Clear to auscultation bilaterally, diminished bibasilar breath  sounds Cardio: Regular rate and rhythm, S1, S2 normal, no murmur, click, rub or gallop, left chest Port-A-Cath covered with EMLA cream Breasts:right mastectomy site healing well, seroma in right axillary tissue, drains in place.  Left breast not examined today GI: Soft, distended, non-tender, hypoactive bowel sounds, no organomegaly Extremities: Extremities normal, atraumatic, no cyanosis, LE edema, LLE (+1)  is larger than RLE (trace edema), mild swelling in left upper extremity in forearm.   Lymph nodes: Cervical, supraclavicular, and axillary nodes normal Neurologic: Grossly normal  ECOG FS:  Grade 2 - Symptomatic, but completely ambulatory  LAB RESULTS: Lab Results  Component Value Date   WBC 4.9 04/08/2013   NEUTROABS 3.4 04/08/2013   HGB 10.7* 04/08/2013   HCT 31.3* 04/08/2013   MCV 86.9 04/08/2013   PLT 185 04/08/2013      Chemistry      Component Value Date/Time   NA 142 04/08/2013 1249   NA 140 02/10/2013 0400   K 3.9 04/08/2013 1249   K 3.4* 02/10/2013 0400   CL 107 04/08/2013 1249   CL 106 02/10/2013 0400   CO2 27 04/08/2013 1249   CO2 26 02/10/2013 0400   BUN 21.6 04/08/2013 1249   BUN 11 02/10/2013 0400   CREATININE 0.9 04/08/2013 1249   CREATININE 0.84 02/10/2013 0400      Component Value Date/Time   CALCIUM 9.2 04/08/2013 1249   CALCIUM 8.7 02/10/2013 0400   ALKPHOS 64 04/08/2013 1249   ALKPHOS 66 01/28/2013 1630   AST 19 04/08/2013 1249   AST 36 01/28/2013 1630   ALT 20 04/08/2013 1249   ALT 55* 01/28/2013 1630   BILITOT 0.28 04/08/2013 1249   BILITOT 0.2* 01/28/2013 1630      Lab Results  Component Value Date   LABCA2 16 07/19/2012     RADIOGRAPHIC STUDIES: No results found.  ASSESSMENT: 67 y.o. [place], [place] woman with:  1.  Inflammatory right breast  cancer with positive lymph nodes the tumor is ER negative PR negative HER-2/neu positive.  Patient is s/p neoadjuvant chemotherapy consisting of FEC q 2 weeks x 4 cycles.  Status post weekly Gemcitabine and Herceptin that was started on  11/19/12 and completed on 12/17/2012 after 5 cycles.   Herceptin Qweekly x 12 weeks started on 01/14/2013 and she will receive a Herceptin infusion today.  Patient underwent right mastectomy by Dr. Dwain Sarna on 02/08/13 with complete pathologic response to neoadjuvant chemotherapy. Adjuvant Herceptin every 3 weeks started on 02/25/13, and radiation therapy with Dr. Michell Heinrich began on 03/31/13.   2.  Bilateral lower extremity DVT and Bilateral PE  3.  Lower extremity edema   PLAN:  1.  Bilateral DVT and PE.  The patient has a positive Prothrombin Gene Mutation, heterozygous. S/p IVC filter inserted on  12/27/2012. She will continue on Coumadin and take 7.5 mg three nights per week and 5mg  4 nights per week.  Her INR remains stable. Though her INR is slightly decreased today, she has been eating more salads this week than usual. We will keep the dose the same, she will decrease her salad intake to her usual amount, and we will recheck next week.    2. Patient will proceed with Herceptin today.  She will continue radiation therapy.    3. The left arm swelling is stable, Dr. Welton Flakes came in and reassured the patient regarding this.    4. I will see her back on 5/9 for her next appt.   All questions were answered.  The patient was encouraged to contact us in the interim with any problems, questions or concerns.   I spent 25 minutes counseling the patient face to face.  The total time spent in the appointment was 30 minutes.  Cherie Ouch Lyn Hollingshead, NP Medical Oncology [facility] Phone: [phone] 04/10/2013, 8:09 AM

## 2013-04-11 ENCOUNTER — Telehealth: Payer: Self-pay | Admitting: Oncology

## 2013-04-11 ENCOUNTER — Ambulatory Visit
Admission: RE | Admit: 2013-04-11 | Discharge: 2013-04-11 | Disposition: A | Discharge status: Home / Self Care | Payer: Medicare Other | Source: Ambulatory Visit | Attending: Radiation Oncology | Admitting: Radiation Oncology

## 2013-04-11 DIAGNOSIS — M79609 Pain in unspecified limb: Secondary | ICD-10-CM | POA: Diagnosis not present

## 2013-04-11 DIAGNOSIS — Z510 Encounter for antineoplastic radiation therapy: Secondary | ICD-10-CM | POA: Diagnosis not present

## 2013-04-11 DIAGNOSIS — Z9221 Personal history of antineoplastic chemotherapy: Secondary | ICD-10-CM | POA: Diagnosis not present

## 2013-04-11 DIAGNOSIS — Z7901 Long term (current) use of anticoagulants: Secondary | ICD-10-CM | POA: Diagnosis not present

## 2013-04-11 DIAGNOSIS — C50119 Malignant neoplasm of central portion of unspecified female breast: Secondary | ICD-10-CM | POA: Diagnosis not present

## 2013-04-11 DIAGNOSIS — R5383 Other fatigue: Secondary | ICD-10-CM | POA: Diagnosis not present

## 2013-04-12 ENCOUNTER — Encounter: Payer: Self-pay | Admitting: Radiation Oncology

## 2013-04-12 ENCOUNTER — Ambulatory Visit
Admission: RE | Admit: 2013-04-12 | Discharge: 2013-04-12 | Disposition: A | Discharge status: Home / Self Care | Payer: Medicare Other | Source: Ambulatory Visit | Attending: Radiation Oncology | Admitting: Radiation Oncology

## 2013-04-12 ENCOUNTER — Other Ambulatory Visit (HOSPITAL_COMMUNITY): Payer: Self-pay | Admitting: Interventional Radiology

## 2013-04-12 VITALS — BP 131/65 | HR 71 | Temp 98.6°F | Resp 20 | Wt 139.3 lb

## 2013-04-12 DIAGNOSIS — R5381 Other malaise: Secondary | ICD-10-CM | POA: Diagnosis not present

## 2013-04-12 DIAGNOSIS — Z7901 Long term (current) use of anticoagulants: Secondary | ICD-10-CM | POA: Diagnosis not present

## 2013-04-12 DIAGNOSIS — M79609 Pain in unspecified limb: Secondary | ICD-10-CM | POA: Diagnosis not present

## 2013-04-12 DIAGNOSIS — C50911 Malignant neoplasm of unspecified site of right female breast: Secondary | ICD-10-CM

## 2013-04-12 DIAGNOSIS — C50119 Malignant neoplasm of central portion of unspecified female breast: Secondary | ICD-10-CM | POA: Diagnosis not present

## 2013-04-12 DIAGNOSIS — Z510 Encounter for antineoplastic radiation therapy: Secondary | ICD-10-CM | POA: Diagnosis not present

## 2013-04-12 DIAGNOSIS — Z9221 Personal history of antineoplastic chemotherapy: Secondary | ICD-10-CM | POA: Diagnosis not present

## 2013-04-12 NOTE — Progress Notes (Signed)
Weekly rad txs rt chest wall, 9 txs completed, alert,oriented x3, erythema slight  Seen, skin intact, still has numbness feet from taxol, had g herceptin last Friday, appetite good, drinks plenty water, uses radiaplex bid 4:20 PM

## 2013-04-12 NOTE — Progress Notes (Signed)
Weekly Management Note Current Dose: 25.2  Gy  Projected Dose: 45 Gy   Narrative:  The patient presents for routine under treatment assessment.  CBCT/MVCT images/Port film x-rays were reviewed.  The chart was checked. Left breast slightly pink.   Physical Findings: Weight: 139 lb 4.8 oz (63.186 kg). Very slight dermatitis in upper inner quadrant of breast  Impression:  The patient is tolerating radiation.  Plan:  Continue treatment as planned. Continue radiaplex.

## 2013-04-12 NOTE — Progress Notes (Signed)
Weekly Management Note Current Dose:   Gy  Projected Dose:  Gy   Narrative:  The patient presents for routine under treatment assessment.  CBCT/MVCT images/Port film x-rays were reviewed.  The chart was checked. Doing well. No complaints.  Physical Findings: Weight: 139 lb 4.8 oz (63.186 kg). Unchanged  Impression:  The patient is tolerating radiation.  Plan:  Continue treatment as planned. Continue radiaplex.

## 2013-04-13 ENCOUNTER — Ambulatory Visit
Admission: RE | Admit: 2013-04-13 | Discharge: 2013-04-13 | Disposition: A | Discharge status: Home / Self Care | Payer: Medicare Other | Source: Ambulatory Visit | Attending: Radiation Oncology | Admitting: Radiation Oncology

## 2013-04-13 DIAGNOSIS — Z7901 Long term (current) use of anticoagulants: Secondary | ICD-10-CM | POA: Diagnosis not present

## 2013-04-13 DIAGNOSIS — Z9221 Personal history of antineoplastic chemotherapy: Secondary | ICD-10-CM | POA: Diagnosis not present

## 2013-04-13 DIAGNOSIS — C50119 Malignant neoplasm of central portion of unspecified female breast: Secondary | ICD-10-CM | POA: Diagnosis not present

## 2013-04-13 DIAGNOSIS — M79609 Pain in unspecified limb: Secondary | ICD-10-CM | POA: Diagnosis not present

## 2013-04-13 DIAGNOSIS — R5383 Other fatigue: Secondary | ICD-10-CM | POA: Diagnosis not present

## 2013-04-13 DIAGNOSIS — Z510 Encounter for antineoplastic radiation therapy: Secondary | ICD-10-CM | POA: Diagnosis not present

## 2013-04-14 ENCOUNTER — Ambulatory Visit
Admission: RE | Admit: 2013-04-14 | Discharge: 2013-04-14 | Disposition: A | Discharge status: Home / Self Care | Payer: Medicare Other | Source: Ambulatory Visit | Attending: Radiation Oncology | Admitting: Radiation Oncology

## 2013-04-14 DIAGNOSIS — M79609 Pain in unspecified limb: Secondary | ICD-10-CM | POA: Diagnosis not present

## 2013-04-14 DIAGNOSIS — R5381 Other malaise: Secondary | ICD-10-CM | POA: Diagnosis not present

## 2013-04-14 DIAGNOSIS — Z9221 Personal history of antineoplastic chemotherapy: Secondary | ICD-10-CM | POA: Diagnosis not present

## 2013-04-14 DIAGNOSIS — Z7901 Long term (current) use of anticoagulants: Secondary | ICD-10-CM | POA: Diagnosis not present

## 2013-04-14 DIAGNOSIS — Z510 Encounter for antineoplastic radiation therapy: Secondary | ICD-10-CM | POA: Diagnosis not present

## 2013-04-14 DIAGNOSIS — C50919 Malignant neoplasm of unspecified site of unspecified female breast: Secondary | ICD-10-CM | POA: Diagnosis not present

## 2013-04-14 DIAGNOSIS — C50119 Malignant neoplasm of central portion of unspecified female breast: Secondary | ICD-10-CM | POA: Diagnosis not present

## 2013-04-15 ENCOUNTER — Encounter: Payer: Self-pay | Admitting: Radiation Oncology

## 2013-04-15 ENCOUNTER — Telehealth: Payer: Self-pay | Admitting: *Deleted

## 2013-04-15 ENCOUNTER — Encounter: Payer: Self-pay | Admitting: Adult Health

## 2013-04-15 ENCOUNTER — Other Ambulatory Visit: Payer: Medicare Other | Admitting: Lab

## 2013-04-15 ENCOUNTER — Telehealth: Payer: Self-pay | Admitting: Emergency Medicine

## 2013-04-15 ENCOUNTER — Ambulatory Visit
Admission: RE | Admit: 2013-04-15 | Discharge: 2013-04-15 | Disposition: A | Discharge status: Home / Self Care | Payer: Medicare Other | Source: Ambulatory Visit | Attending: Radiation Oncology | Admitting: Radiation Oncology

## 2013-04-15 ENCOUNTER — Other Ambulatory Visit (HOSPITAL_BASED_OUTPATIENT_CLINIC_OR_DEPARTMENT_OTHER): Payer: Medicare Other

## 2013-04-15 VITALS — BP 124/73 | HR 68 | Temp 97.8°F | Resp 20 | Wt 139.4 lb

## 2013-04-15 DIAGNOSIS — R5383 Other fatigue: Secondary | ICD-10-CM | POA: Diagnosis not present

## 2013-04-15 DIAGNOSIS — C50911 Malignant neoplasm of unspecified site of right female breast: Secondary | ICD-10-CM

## 2013-04-15 DIAGNOSIS — Z7901 Long term (current) use of anticoagulants: Secondary | ICD-10-CM | POA: Diagnosis not present

## 2013-04-15 DIAGNOSIS — C50919 Malignant neoplasm of unspecified site of unspecified female breast: Secondary | ICD-10-CM

## 2013-04-15 DIAGNOSIS — I82409 Acute embolism and thrombosis of unspecified deep veins of unspecified lower extremity: Secondary | ICD-10-CM | POA: Diagnosis not present

## 2013-04-15 DIAGNOSIS — M79609 Pain in unspecified limb: Secondary | ICD-10-CM | POA: Diagnosis not present

## 2013-04-15 DIAGNOSIS — Z9221 Personal history of antineoplastic chemotherapy: Secondary | ICD-10-CM | POA: Diagnosis not present

## 2013-04-15 DIAGNOSIS — I82403 Acute embolism and thrombosis of unspecified deep veins of lower extremity, bilateral: Secondary | ICD-10-CM

## 2013-04-15 DIAGNOSIS — Z510 Encounter for antineoplastic radiation therapy: Secondary | ICD-10-CM | POA: Diagnosis not present

## 2013-04-15 DIAGNOSIS — C50119 Malignant neoplasm of central portion of unspecified female breast: Secondary | ICD-10-CM | POA: Diagnosis not present

## 2013-04-15 LAB — CBC WITH DIFFERENTIAL/PLATELET
Basophils Absolute: 0 10*3/uL (ref 0.0–0.1)
Eosinophils Absolute: 0.1 10*3/uL (ref 0.0–0.5)
HCT: 32.4 % — ABNORMAL LOW (ref 34.8–46.6)
HGB: 11.4 g/dL — ABNORMAL LOW (ref 11.6–15.9)
LYMPH%: 14.5 % (ref 14.0–49.7)
MCV: 86.7 fL (ref 79.5–101.0)
MONO%: 10.6 % (ref 0.0–14.0)
NEUT#: 3.3 10*3/uL (ref 1.5–6.5)
Platelets: 187 10*3/uL (ref 145–400)

## 2013-04-15 LAB — COMPREHENSIVE METABOLIC PANEL (CC13)
Albumin: 3.5 g/dL (ref 3.5–5.0)
Alkaline Phosphatase: 62 U/L (ref 40–150)
BUN: 24.4 mg/dL (ref 7.0–26.0)
CO2: 28 mEq/L (ref 22–29)
Glucose: 104 mg/dl — ABNORMAL HIGH (ref 70–99)
Total Bilirubin: 0.23 mg/dL (ref 0.20–1.20)

## 2013-04-15 NOTE — Telephone Encounter (Signed)
Instructed patient, per Augustin Schooling NP, to increase Coumadin dose to 7.5 mg for 5 nights and 5 mg for 2 nights and return on 5/23 for repeat labs.

## 2013-04-15 NOTE — Progress Notes (Signed)
Weekly Management Note Current Dose:  21.6 Gy  Projected Dose: 60.4 Gy   Narrative:  The patient presents for routine under treatment assessment.  CBCT/MVCT images/Port film x-rays were reviewed.  The chart was checked. Doing well. No complaints.   Physical Findings: Weight: 139 lb 6.4 oz (63.231 kg). Slightly pink.   Impression:  The patient is tolerating radiation.  Plan:  Continue treatment as planned. Continue radiaplex.

## 2013-04-15 NOTE — Progress Notes (Addendum)
Patient here weekly rad tx  Rt CW, Sclav, , 12 completed so far Alert,oriented x2, sslight pink, no changes, skin intact,no c/o pain , using radoiplex gel, appetite good, 4:23 PM

## 2013-04-15 NOTE — Telephone Encounter (Signed)
sw pt gv an lab appt for 04/22/13 @ 3:30pm. The pt is aware...td

## 2013-04-18 ENCOUNTER — Ambulatory Visit
Admission: RE | Admit: 2013-04-18 | Discharge: 2013-04-18 | Disposition: A | Discharge status: Home / Self Care | Payer: Medicare Other | Source: Ambulatory Visit | Attending: Radiation Oncology | Admitting: Radiation Oncology

## 2013-04-18 ENCOUNTER — Encounter: Payer: Self-pay | Admitting: Oncology

## 2013-04-18 DIAGNOSIS — Z510 Encounter for antineoplastic radiation therapy: Secondary | ICD-10-CM | POA: Diagnosis not present

## 2013-04-18 DIAGNOSIS — Z7901 Long term (current) use of anticoagulants: Secondary | ICD-10-CM | POA: Diagnosis not present

## 2013-04-18 DIAGNOSIS — C50119 Malignant neoplasm of central portion of unspecified female breast: Secondary | ICD-10-CM | POA: Diagnosis not present

## 2013-04-18 DIAGNOSIS — Z9221 Personal history of antineoplastic chemotherapy: Secondary | ICD-10-CM | POA: Diagnosis not present

## 2013-04-18 DIAGNOSIS — R5383 Other fatigue: Secondary | ICD-10-CM | POA: Diagnosis not present

## 2013-04-18 DIAGNOSIS — M79609 Pain in unspecified limb: Secondary | ICD-10-CM | POA: Diagnosis not present

## 2013-04-18 NOTE — Progress Notes (Signed)
04/18/2013 I spoke with this patient today RE: a charge from interventional radiology actually @ [facility].  This was sent to me by Candida Peeling.  I explained to patient to the best of my ability that I don not pre-cert interventional radiology procedures. Patient requested a copy of the order that was performed 10/8/01013 and has gone to collections from [facility].  Jessica Wells

## 2013-04-18 NOTE — Progress Notes (Signed)
This encounter was created in error - please disregard.

## 2013-04-19 ENCOUNTER — Encounter: Payer: Self-pay | Admitting: Adult Health

## 2013-04-19 ENCOUNTER — Ambulatory Visit
Admission: RE | Admit: 2013-04-19 | Discharge: 2013-04-19 | Disposition: A | Discharge status: Home / Self Care | Payer: Medicare Other | Source: Ambulatory Visit | Attending: Radiation Oncology | Admitting: Radiation Oncology

## 2013-04-19 DIAGNOSIS — Z510 Encounter for antineoplastic radiation therapy: Secondary | ICD-10-CM | POA: Diagnosis not present

## 2013-04-19 DIAGNOSIS — R5383 Other fatigue: Secondary | ICD-10-CM | POA: Diagnosis not present

## 2013-04-19 DIAGNOSIS — M79609 Pain in unspecified limb: Secondary | ICD-10-CM | POA: Diagnosis not present

## 2013-04-19 DIAGNOSIS — Z7901 Long term (current) use of anticoagulants: Secondary | ICD-10-CM | POA: Diagnosis not present

## 2013-04-19 DIAGNOSIS — C50119 Malignant neoplasm of central portion of unspecified female breast: Secondary | ICD-10-CM | POA: Diagnosis not present

## 2013-04-19 DIAGNOSIS — Z9221 Personal history of antineoplastic chemotherapy: Secondary | ICD-10-CM | POA: Diagnosis not present

## 2013-04-20 ENCOUNTER — Other Ambulatory Visit: Payer: Self-pay | Admitting: *Deleted

## 2013-04-20 ENCOUNTER — Ambulatory Visit
Admission: RE | Admit: 2013-04-20 | Discharge: 2013-04-20 | Disposition: A | Discharge status: Home / Self Care | Payer: Medicare Other | Source: Ambulatory Visit | Attending: Radiation Oncology | Admitting: Radiation Oncology

## 2013-04-20 DIAGNOSIS — Z510 Encounter for antineoplastic radiation therapy: Secondary | ICD-10-CM | POA: Diagnosis not present

## 2013-04-20 DIAGNOSIS — R5383 Other fatigue: Secondary | ICD-10-CM | POA: Diagnosis not present

## 2013-04-20 DIAGNOSIS — R5381 Other malaise: Secondary | ICD-10-CM | POA: Diagnosis not present

## 2013-04-20 DIAGNOSIS — I82409 Acute embolism and thrombosis of unspecified deep veins of unspecified lower extremity: Secondary | ICD-10-CM

## 2013-04-20 DIAGNOSIS — Z7901 Long term (current) use of anticoagulants: Secondary | ICD-10-CM | POA: Diagnosis not present

## 2013-04-20 DIAGNOSIS — I2699 Other pulmonary embolism without acute cor pulmonale: Secondary | ICD-10-CM

## 2013-04-20 DIAGNOSIS — C50119 Malignant neoplasm of central portion of unspecified female breast: Secondary | ICD-10-CM | POA: Diagnosis not present

## 2013-04-20 DIAGNOSIS — Z9221 Personal history of antineoplastic chemotherapy: Secondary | ICD-10-CM | POA: Diagnosis not present

## 2013-04-20 DIAGNOSIS — M79609 Pain in unspecified limb: Secondary | ICD-10-CM | POA: Diagnosis not present

## 2013-04-20 MED ORDER — WARFARIN SODIUM 5 MG PO TABS: ORAL_TABLET | ORAL | Status: DC

## 2013-04-20 NOTE — Telephone Encounter (Signed)
My chart message received from [facility] requesting refill.  Will run out on Sunday, 04-24-2013, has new directions and needs different quantity of coumadin.

## 2013-04-21 ENCOUNTER — Ambulatory Visit
Admission: RE | Admit: 2013-04-21 | Discharge: 2013-04-21 | Disposition: A | Discharge status: Home / Self Care | Payer: Medicare Other | Source: Ambulatory Visit | Attending: Radiation Oncology | Admitting: Radiation Oncology

## 2013-04-21 DIAGNOSIS — R5383 Other fatigue: Secondary | ICD-10-CM | POA: Diagnosis not present

## 2013-04-21 DIAGNOSIS — M79609 Pain in unspecified limb: Secondary | ICD-10-CM | POA: Diagnosis not present

## 2013-04-21 DIAGNOSIS — Z510 Encounter for antineoplastic radiation therapy: Secondary | ICD-10-CM | POA: Diagnosis not present

## 2013-04-21 DIAGNOSIS — Z7901 Long term (current) use of anticoagulants: Secondary | ICD-10-CM | POA: Diagnosis not present

## 2013-04-21 DIAGNOSIS — C50919 Malignant neoplasm of unspecified site of unspecified female breast: Secondary | ICD-10-CM | POA: Diagnosis not present

## 2013-04-21 DIAGNOSIS — C50119 Malignant neoplasm of central portion of unspecified female breast: Secondary | ICD-10-CM | POA: Diagnosis not present

## 2013-04-21 DIAGNOSIS — Z9221 Personal history of antineoplastic chemotherapy: Secondary | ICD-10-CM | POA: Diagnosis not present

## 2013-04-22 ENCOUNTER — Encounter: Payer: Self-pay | Admitting: Radiation Oncology

## 2013-04-22 ENCOUNTER — Ambulatory Visit
Admission: RE | Admit: 2013-04-22 | Discharge: 2013-04-22 | Disposition: A | Discharge status: Home / Self Care | Payer: Medicare Other | Source: Ambulatory Visit | Attending: Radiation Oncology | Admitting: Radiation Oncology

## 2013-04-22 ENCOUNTER — Other Ambulatory Visit (HOSPITAL_BASED_OUTPATIENT_CLINIC_OR_DEPARTMENT_OTHER): Payer: Medicare Other | Admitting: Lab

## 2013-04-22 VITALS — BP 127/68 | HR 71 | Resp 16 | Wt 139.7 lb

## 2013-04-22 DIAGNOSIS — I82409 Acute embolism and thrombosis of unspecified deep veins of unspecified lower extremity: Secondary | ICD-10-CM

## 2013-04-22 DIAGNOSIS — R5381 Other malaise: Secondary | ICD-10-CM | POA: Diagnosis not present

## 2013-04-22 DIAGNOSIS — C50911 Malignant neoplasm of unspecified site of right female breast: Secondary | ICD-10-CM

## 2013-04-22 DIAGNOSIS — Z9221 Personal history of antineoplastic chemotherapy: Secondary | ICD-10-CM | POA: Diagnosis not present

## 2013-04-22 DIAGNOSIS — Z7901 Long term (current) use of anticoagulants: Secondary | ICD-10-CM | POA: Diagnosis not present

## 2013-04-22 DIAGNOSIS — I82403 Acute embolism and thrombosis of unspecified deep veins of lower extremity, bilateral: Secondary | ICD-10-CM

## 2013-04-22 DIAGNOSIS — M79609 Pain in unspecified limb: Secondary | ICD-10-CM | POA: Diagnosis not present

## 2013-04-22 DIAGNOSIS — C50119 Malignant neoplasm of central portion of unspecified female breast: Secondary | ICD-10-CM | POA: Diagnosis not present

## 2013-04-22 DIAGNOSIS — Z510 Encounter for antineoplastic radiation therapy: Secondary | ICD-10-CM | POA: Diagnosis not present

## 2013-04-22 LAB — PROTIME-INR
INR: 2.1 (ref 2.00–3.50)
Protime: 25.2 Seconds — ABNORMAL HIGH (ref 10.6–13.4)

## 2013-04-22 NOTE — Progress Notes (Signed)
Weekly Management Note Current Dose: 30.6  Gy  Projected Dose: 60.4 Gy   Narrative:  The patient presents for routine under treatment assessment.  CBCT/MVCT images/Port film x-rays were reviewed.  The chart was checked. Doing well. No fatigue.  No pain.  Physical Findings: Weight: 139 lb 11.2 oz (63.368 kg). Slightly pink.   Impression:  The patient is tolerating radiation.  Plan:  Continue treatment as planned. Continue radiaplex.

## 2013-04-22 NOTE — Progress Notes (Signed)
Alert and oriented to person, place, and time. No distress noted. Steady gait noted. Pleasant affect noted. Patient denies pain at this time. Patient reports only hyperpigmentation without desquamation of treated area. Patient reports using radiaplex bid as directed. Patient denies fatigue. Reported all findings to Dr. Michell Heinrich.

## 2013-04-26 ENCOUNTER — Ambulatory Visit
Admission: RE | Admit: 2013-04-26 | Discharge: 2013-04-26 | Disposition: A | Discharge status: Home / Self Care | Payer: Medicare Other | Source: Ambulatory Visit | Attending: Radiation Oncology | Admitting: Radiation Oncology

## 2013-04-26 DIAGNOSIS — R5381 Other malaise: Secondary | ICD-10-CM | POA: Diagnosis not present

## 2013-04-26 DIAGNOSIS — Z510 Encounter for antineoplastic radiation therapy: Secondary | ICD-10-CM | POA: Diagnosis not present

## 2013-04-26 DIAGNOSIS — Z7901 Long term (current) use of anticoagulants: Secondary | ICD-10-CM | POA: Diagnosis not present

## 2013-04-26 DIAGNOSIS — C50119 Malignant neoplasm of central portion of unspecified female breast: Secondary | ICD-10-CM | POA: Diagnosis not present

## 2013-04-26 DIAGNOSIS — Z9221 Personal history of antineoplastic chemotherapy: Secondary | ICD-10-CM | POA: Diagnosis not present

## 2013-04-26 DIAGNOSIS — M79609 Pain in unspecified limb: Secondary | ICD-10-CM | POA: Diagnosis not present

## 2013-04-27 ENCOUNTER — Ambulatory Visit
Admission: RE | Admit: 2013-04-27 | Discharge: 2013-04-27 | Disposition: A | Discharge status: Home / Self Care | Payer: Medicare Other | Source: Ambulatory Visit | Attending: Radiation Oncology | Admitting: Radiation Oncology

## 2013-04-27 DIAGNOSIS — M949 Disorder of cartilage, unspecified: Secondary | ICD-10-CM | POA: Diagnosis not present

## 2013-04-27 DIAGNOSIS — R5383 Other fatigue: Secondary | ICD-10-CM | POA: Diagnosis not present

## 2013-04-27 DIAGNOSIS — M899 Disorder of bone, unspecified: Secondary | ICD-10-CM | POA: Diagnosis not present

## 2013-04-27 DIAGNOSIS — Z86711 Personal history of pulmonary embolism: Secondary | ICD-10-CM | POA: Diagnosis not present

## 2013-04-27 DIAGNOSIS — M79609 Pain in unspecified limb: Secondary | ICD-10-CM | POA: Diagnosis not present

## 2013-04-27 DIAGNOSIS — Z9221 Personal history of antineoplastic chemotherapy: Secondary | ICD-10-CM | POA: Diagnosis not present

## 2013-04-27 DIAGNOSIS — E785 Hyperlipidemia, unspecified: Secondary | ICD-10-CM | POA: Diagnosis not present

## 2013-04-27 DIAGNOSIS — M799 Soft tissue disorder, unspecified: Secondary | ICD-10-CM | POA: Diagnosis not present

## 2013-04-27 DIAGNOSIS — C50119 Malignant neoplasm of central portion of unspecified female breast: Secondary | ICD-10-CM | POA: Diagnosis not present

## 2013-04-27 DIAGNOSIS — J309 Allergic rhinitis, unspecified: Secondary | ICD-10-CM | POA: Diagnosis not present

## 2013-04-27 DIAGNOSIS — C50919 Malignant neoplasm of unspecified site of unspecified female breast: Secondary | ICD-10-CM | POA: Diagnosis not present

## 2013-04-27 DIAGNOSIS — R7301 Impaired fasting glucose: Secondary | ICD-10-CM | POA: Diagnosis not present

## 2013-04-27 DIAGNOSIS — Z510 Encounter for antineoplastic radiation therapy: Secondary | ICD-10-CM | POA: Diagnosis not present

## 2013-04-27 DIAGNOSIS — Z7901 Long term (current) use of anticoagulants: Secondary | ICD-10-CM | POA: Diagnosis not present

## 2013-04-28 ENCOUNTER — Ambulatory Visit
Admission: RE | Admit: 2013-04-28 | Discharge: 2013-04-28 | Disposition: A | Discharge status: Home / Self Care | Payer: Medicare Other | Source: Ambulatory Visit | Attending: Radiation Oncology | Admitting: Radiation Oncology

## 2013-04-28 DIAGNOSIS — Z9221 Personal history of antineoplastic chemotherapy: Secondary | ICD-10-CM | POA: Diagnosis not present

## 2013-04-28 DIAGNOSIS — Z7901 Long term (current) use of anticoagulants: Secondary | ICD-10-CM | POA: Diagnosis not present

## 2013-04-28 DIAGNOSIS — Z510 Encounter for antineoplastic radiation therapy: Secondary | ICD-10-CM | POA: Diagnosis not present

## 2013-04-28 DIAGNOSIS — R5383 Other fatigue: Secondary | ICD-10-CM | POA: Diagnosis not present

## 2013-04-28 DIAGNOSIS — M79609 Pain in unspecified limb: Secondary | ICD-10-CM | POA: Diagnosis not present

## 2013-04-28 DIAGNOSIS — C50119 Malignant neoplasm of central portion of unspecified female breast: Secondary | ICD-10-CM | POA: Diagnosis not present

## 2013-04-29 ENCOUNTER — Ambulatory Visit (HOSPITAL_BASED_OUTPATIENT_CLINIC_OR_DEPARTMENT_OTHER): Payer: Medicare Other

## 2013-04-29 ENCOUNTER — Ambulatory Visit
Admission: RE | Admit: 2013-04-29 | Discharge: 2013-04-29 | Disposition: A | Discharge status: Home / Self Care | Payer: Medicare Other | Source: Ambulatory Visit | Attending: Radiation Oncology | Admitting: Radiation Oncology

## 2013-04-29 ENCOUNTER — Encounter: Payer: Self-pay | Admitting: Oncology

## 2013-04-29 ENCOUNTER — Ambulatory Visit (HOSPITAL_BASED_OUTPATIENT_CLINIC_OR_DEPARTMENT_OTHER): Payer: Medicare Other | Admitting: Oncology

## 2013-04-29 ENCOUNTER — Other Ambulatory Visit (HOSPITAL_BASED_OUTPATIENT_CLINIC_OR_DEPARTMENT_OTHER): Payer: Medicare Other | Admitting: Lab

## 2013-04-29 VITALS — BP 136/78 | HR 74 | Temp 98.3°F | Resp 20 | Ht 61.0 in | Wt 138.3 lb

## 2013-04-29 DIAGNOSIS — C773 Secondary and unspecified malignant neoplasm of axilla and upper limb lymph nodes: Secondary | ICD-10-CM | POA: Diagnosis not present

## 2013-04-29 DIAGNOSIS — C50111 Malignant neoplasm of central portion of right female breast: Secondary | ICD-10-CM

## 2013-04-29 DIAGNOSIS — C50919 Malignant neoplasm of unspecified site of unspecified female breast: Secondary | ICD-10-CM

## 2013-04-29 DIAGNOSIS — M7989 Other specified soft tissue disorders: Secondary | ICD-10-CM

## 2013-04-29 DIAGNOSIS — I82403 Acute embolism and thrombosis of unspecified deep veins of lower extremity, bilateral: Secondary | ICD-10-CM

## 2013-04-29 DIAGNOSIS — Z5112 Encounter for antineoplastic immunotherapy: Secondary | ICD-10-CM | POA: Diagnosis not present

## 2013-04-29 DIAGNOSIS — I82409 Acute embolism and thrombosis of unspecified deep veins of unspecified lower extremity: Secondary | ICD-10-CM | POA: Diagnosis not present

## 2013-04-29 DIAGNOSIS — I2699 Other pulmonary embolism without acute cor pulmonale: Secondary | ICD-10-CM

## 2013-04-29 DIAGNOSIS — M79609 Pain in unspecified limb: Secondary | ICD-10-CM | POA: Diagnosis not present

## 2013-04-29 DIAGNOSIS — J9601 Acute respiratory failure with hypoxia: Secondary | ICD-10-CM

## 2013-04-29 DIAGNOSIS — R5381 Other malaise: Secondary | ICD-10-CM | POA: Diagnosis not present

## 2013-04-29 DIAGNOSIS — Z7901 Long term (current) use of anticoagulants: Secondary | ICD-10-CM | POA: Diagnosis not present

## 2013-04-29 DIAGNOSIS — Z9221 Personal history of antineoplastic chemotherapy: Secondary | ICD-10-CM | POA: Diagnosis not present

## 2013-04-29 DIAGNOSIS — Z510 Encounter for antineoplastic radiation therapy: Secondary | ICD-10-CM | POA: Diagnosis not present

## 2013-04-29 DIAGNOSIS — C50119 Malignant neoplasm of central portion of unspecified female breast: Secondary | ICD-10-CM | POA: Diagnosis not present

## 2013-04-29 LAB — CBC WITH DIFFERENTIAL/PLATELET
Basophils Absolute: 0 10*3/uL (ref 0.0–0.1)
EOS%: 3.7 % (ref 0.0–7.0)
HCT: 33.1 % — ABNORMAL LOW (ref 34.8–46.6)
HGB: 11.2 g/dL — ABNORMAL LOW (ref 11.6–15.9)
LYMPH%: 15.9 % (ref 14.0–49.7)
MCH: 29.4 pg (ref 25.1–34.0)
MCV: 86.9 fL (ref 79.5–101.0)
MONO%: 8.5 % (ref 0.0–14.0)
NEUT%: 71.5 % (ref 38.4–76.8)
Platelets: 160 10*3/uL (ref 145–400)

## 2013-04-29 LAB — COMPREHENSIVE METABOLIC PANEL (CC13)
Albumin: 3.6 g/dL (ref 3.5–5.0)
BUN: 25 mg/dL (ref 7.0–26.0)
Calcium: 9.2 mg/dL (ref 8.4–10.4)
Chloride: 104 mEq/L (ref 98–107)
Glucose: 107 mg/dl — ABNORMAL HIGH (ref 70–99)
Potassium: 3.9 mEq/L (ref 3.5–5.1)

## 2013-04-29 MED ORDER — DIPHENHYDRAMINE HCL 25 MG PO CAPS: 50.0000 mg | ORAL_CAPSULE | Freq: Once | ORAL | Status: DC

## 2013-04-29 MED ORDER — SODIUM CHLORIDE 0.9 % IJ SOLN
10.0000 mL | INTRAMUSCULAR | Status: DC | PRN
  Administered 2013-04-29: 10 mL
  Filled 2013-04-29: qty 10

## 2013-04-29 MED ORDER — HEPARIN SOD (PORK) LOCK FLUSH 100 UNIT/ML IV SOLN
500.0000 [IU] | Freq: Once | INTRAVENOUS | Status: AC | PRN
  Administered 2013-04-29: 500 [IU]
  Filled 2013-04-29: qty 5

## 2013-04-29 MED ORDER — ACETAMINOPHEN 325 MG PO TABS
650.0000 mg | ORAL_TABLET | Freq: Once | ORAL | Status: AC
  Administered 2013-04-29: 650 mg via ORAL

## 2013-04-29 MED ORDER — TRASTUZUMAB CHEMO INJECTION 440 MG
6.0000 mg/kg | Freq: Once | INTRAVENOUS | Status: AC
  Administered 2013-04-29: 378 mg via INTRAVENOUS
  Filled 2013-04-29: qty 18

## 2013-04-29 MED ORDER — SODIUM CHLORIDE 0.9 % IV SOLN
Freq: Once | INTRAVENOUS | Status: AC
  Administered 2013-04-29: 15:00:00 via INTRAVENOUS

## 2013-04-29 NOTE — Patient Instructions (Signed)
Proceed with herceptin

## 2013-04-29 NOTE — Patient Instructions (Addendum)
[facility] Discharge Instructions for Patients Receiving Chemotherapy  Today you received the following chemotherapy agents :Herceptin  To help prevent nausea and vomiting after your treatment, we encourage you to take your nausea medication, zofran,  as prescribed. If you develop nausea and vomiting that is not controlled by your nausea medication, call the clinic.  BELOW ARE SYMPTOMS THAT SHOULD BE REPORTED IMMEDIATELY:  *FEVER GREATER THAN 100.5 F  *CHILLS WITH OR WITHOUT FEVER  NAUSEA AND VOMITING THAT IS NOT CONTROLLED WITH YOUR NAUSEA MEDICATION  *UNUSUAL SHORTNESS OF BREATH  *UNUSUAL BRUISING OR BLEEDING  TENDERNESS IN MOUTH AND THROAT WITH OR WITHOUT PRESENCE OF ULCERS  *URINARY PROBLEMS  *BOWEL PROBLEMS  UNUSUAL RASH Items with * indicate a potential emergency and should be followed up as soon as possible.   The clinic phone number is [phone].

## 2013-05-02 ENCOUNTER — Ambulatory Visit
Admission: RE | Admit: 2013-05-02 | Discharge: 2013-05-02 | Disposition: A | Discharge status: Home / Self Care | Payer: Medicare Other | Source: Ambulatory Visit | Attending: Radiation Oncology | Admitting: Radiation Oncology

## 2013-05-02 DIAGNOSIS — Z7901 Long term (current) use of anticoagulants: Secondary | ICD-10-CM | POA: Diagnosis not present

## 2013-05-02 DIAGNOSIS — Z9221 Personal history of antineoplastic chemotherapy: Secondary | ICD-10-CM | POA: Diagnosis not present

## 2013-05-02 DIAGNOSIS — C50119 Malignant neoplasm of central portion of unspecified female breast: Secondary | ICD-10-CM | POA: Diagnosis not present

## 2013-05-02 DIAGNOSIS — R5383 Other fatigue: Secondary | ICD-10-CM | POA: Diagnosis not present

## 2013-05-02 DIAGNOSIS — Z510 Encounter for antineoplastic radiation therapy: Secondary | ICD-10-CM | POA: Diagnosis not present

## 2013-05-02 DIAGNOSIS — M79609 Pain in unspecified limb: Secondary | ICD-10-CM | POA: Diagnosis not present

## 2013-05-03 ENCOUNTER — Ambulatory Visit
Admission: RE | Admit: 2013-05-03 | Discharge: 2013-05-03 | Disposition: A | Discharge status: Home / Self Care | Payer: Medicare Other | Source: Ambulatory Visit | Attending: Radiation Oncology | Admitting: Radiation Oncology

## 2013-05-03 ENCOUNTER — Ambulatory Visit (INDEPENDENT_AMBULATORY_CARE_PROVIDER_SITE_OTHER): Payer: Medicare Other | Admitting: General Surgery

## 2013-05-03 ENCOUNTER — Encounter (INDEPENDENT_AMBULATORY_CARE_PROVIDER_SITE_OTHER): Payer: Self-pay | Admitting: General Surgery

## 2013-05-03 ENCOUNTER — Encounter: Payer: Self-pay | Admitting: Radiation Oncology

## 2013-05-03 ENCOUNTER — Telehealth (INDEPENDENT_AMBULATORY_CARE_PROVIDER_SITE_OTHER): Payer: Self-pay | Admitting: General Surgery

## 2013-05-03 VITALS — BP 134/57 | HR 66 | Temp 97.7°F | Resp 20 | Wt 139.4 lb

## 2013-05-03 VITALS — BP 141/86 | HR 62 | Temp 98.6°F | Resp 12 | Ht 61.0 in | Wt 138.4 lb

## 2013-05-03 DIAGNOSIS — C50911 Malignant neoplasm of unspecified site of right female breast: Secondary | ICD-10-CM

## 2013-05-03 DIAGNOSIS — M79609 Pain in unspecified limb: Secondary | ICD-10-CM | POA: Diagnosis not present

## 2013-05-03 DIAGNOSIS — Z510 Encounter for antineoplastic radiation therapy: Secondary | ICD-10-CM | POA: Diagnosis not present

## 2013-05-03 DIAGNOSIS — R229 Localized swelling, mass and lump, unspecified: Secondary | ICD-10-CM | POA: Diagnosis not present

## 2013-05-03 DIAGNOSIS — R5381 Other malaise: Secondary | ICD-10-CM | POA: Diagnosis not present

## 2013-05-03 DIAGNOSIS — Z9221 Personal history of antineoplastic chemotherapy: Secondary | ICD-10-CM | POA: Diagnosis not present

## 2013-05-03 DIAGNOSIS — C50119 Malignant neoplasm of central portion of unspecified female breast: Secondary | ICD-10-CM | POA: Diagnosis not present

## 2013-05-03 DIAGNOSIS — R2232 Localized swelling, mass and lump, left upper limb: Secondary | ICD-10-CM

## 2013-05-03 DIAGNOSIS — Z7901 Long term (current) use of anticoagulants: Secondary | ICD-10-CM | POA: Diagnosis not present

## 2013-05-03 NOTE — Progress Notes (Signed)
Weekly Management Note Current Dose:  41.4 Gy  Projected Dose: 60.4 Gy   Narrative:  The patient presents for routine under treatment assessment.  CBCT/MVCT images/Port film x-rays were reviewed.  The chart was checked. Doing well.  Some erythema.   Physical Findings: Weight: 139 lb 6.4 oz (63.231 kg). Erythema (slight)  Impression:  The patient is tolerating radiation.  Plan:  Continue treatment as planned. Continue radiaplex.

## 2013-05-03 NOTE — Progress Notes (Addendum)
Weekly rad txs rcw 23  txs completed so dar, uses radiaplex bid, erythema ,skin intact, tightness, tenderness,  toccasional twinge  Eating well, slight fatigue, doing exercises ,uses radiaplex bid 4:01 PM

## 2013-05-03 NOTE — Progress Notes (Signed)
Subjective:     Patient ID: Jessica Wells, female   DOB: [birth year], 67 y.o.   MRN: [id number]  HPI 31 yof who has right breast inflammatory cancer s/p mrm, adjuvant chemotherapy now on herceptin, receiving radiation therapy who comes in with a couple month history of a mass on ulnar aspect of her wrist. This has been causing more pain of late.  This is associated with some pain with wrist extension and for what she says is "her tendon" hurting her.  She has some tingling sensation locally extending onto medial aspect of her hand.  Otherwise her breast cancer treatment is going well with good results on recent pet and ct scans.  Review of Systems     Objective:   Physical Exam Left wrist with 1x2 cm soft mobile mass ulnar aspect, this is tender, she has pain with wrist extension    Assessment:     Left wrist mass      Plan:     I think this is benign.  I think best given some of associated symptoms for her to see hand surgery and will send to see them.  I reassured her I did not think was her cancer.  She understands she will need to be off coumadin for any procedure.

## 2013-05-03 NOTE — Telephone Encounter (Signed)
Faxed over notes and referral to Dr Mina Marble for patient to be seen on 05-05-13 @ 8:30 am for her wrist.

## 2013-05-04 ENCOUNTER — Ambulatory Visit
Admission: RE | Admit: 2013-05-04 | Discharge: 2013-05-04 | Disposition: A | Discharge status: Home / Self Care | Payer: Medicare Other | Source: Ambulatory Visit | Attending: Radiation Oncology | Admitting: Radiation Oncology

## 2013-05-04 DIAGNOSIS — Z510 Encounter for antineoplastic radiation therapy: Secondary | ICD-10-CM | POA: Diagnosis not present

## 2013-05-04 DIAGNOSIS — Z9221 Personal history of antineoplastic chemotherapy: Secondary | ICD-10-CM | POA: Diagnosis not present

## 2013-05-04 DIAGNOSIS — C50119 Malignant neoplasm of central portion of unspecified female breast: Secondary | ICD-10-CM | POA: Diagnosis not present

## 2013-05-04 DIAGNOSIS — Z7901 Long term (current) use of anticoagulants: Secondary | ICD-10-CM | POA: Diagnosis not present

## 2013-05-04 DIAGNOSIS — M79609 Pain in unspecified limb: Secondary | ICD-10-CM | POA: Diagnosis not present

## 2013-05-04 DIAGNOSIS — R5383 Other fatigue: Secondary | ICD-10-CM | POA: Diagnosis not present

## 2013-05-05 ENCOUNTER — Ambulatory Visit
Admission: RE | Admit: 2013-05-05 | Discharge: 2013-05-05 | Disposition: A | Discharge status: Home / Self Care | Payer: Medicare Other | Source: Ambulatory Visit | Attending: Radiation Oncology | Admitting: Radiation Oncology

## 2013-05-05 DIAGNOSIS — Z7901 Long term (current) use of anticoagulants: Secondary | ICD-10-CM | POA: Diagnosis not present

## 2013-05-05 DIAGNOSIS — R5383 Other fatigue: Secondary | ICD-10-CM | POA: Diagnosis not present

## 2013-05-05 DIAGNOSIS — Z9221 Personal history of antineoplastic chemotherapy: Secondary | ICD-10-CM | POA: Diagnosis not present

## 2013-05-05 DIAGNOSIS — C50119 Malignant neoplasm of central portion of unspecified female breast: Secondary | ICD-10-CM | POA: Diagnosis not present

## 2013-05-05 DIAGNOSIS — M79609 Pain in unspecified limb: Secondary | ICD-10-CM | POA: Diagnosis not present

## 2013-05-05 DIAGNOSIS — Z510 Encounter for antineoplastic radiation therapy: Secondary | ICD-10-CM | POA: Diagnosis not present

## 2013-05-05 DIAGNOSIS — M25539 Pain in unspecified wrist: Secondary | ICD-10-CM | POA: Diagnosis not present

## 2013-05-06 ENCOUNTER — Ambulatory Visit
Admission: RE | Admit: 2013-05-06 | Discharge: 2013-05-06 | Disposition: A | Discharge status: Home / Self Care | Payer: Medicare Other | Source: Ambulatory Visit | Attending: Radiation Oncology | Admitting: Radiation Oncology

## 2013-05-06 ENCOUNTER — Ambulatory Visit: Admission: RE | Admit: 2013-05-06 | Payer: Medicare Other | Source: Ambulatory Visit | Admitting: Radiation Oncology

## 2013-05-06 DIAGNOSIS — C50919 Malignant neoplasm of unspecified site of unspecified female breast: Secondary | ICD-10-CM | POA: Diagnosis not present

## 2013-05-06 DIAGNOSIS — Z7901 Long term (current) use of anticoagulants: Secondary | ICD-10-CM | POA: Diagnosis not present

## 2013-05-06 DIAGNOSIS — R5381 Other malaise: Secondary | ICD-10-CM | POA: Diagnosis not present

## 2013-05-06 DIAGNOSIS — M79609 Pain in unspecified limb: Secondary | ICD-10-CM | POA: Diagnosis not present

## 2013-05-06 DIAGNOSIS — Z9221 Personal history of antineoplastic chemotherapy: Secondary | ICD-10-CM | POA: Diagnosis not present

## 2013-05-06 DIAGNOSIS — Z510 Encounter for antineoplastic radiation therapy: Secondary | ICD-10-CM | POA: Diagnosis not present

## 2013-05-06 DIAGNOSIS — C50119 Malignant neoplasm of central portion of unspecified female breast: Secondary | ICD-10-CM | POA: Diagnosis not present

## 2013-05-09 ENCOUNTER — Ambulatory Visit
Admission: RE | Admit: 2013-05-09 | Discharge: 2013-05-09 | Disposition: A | Discharge status: Home / Self Care | Payer: Medicare Other | Source: Ambulatory Visit | Attending: Radiation Oncology | Admitting: Radiation Oncology

## 2013-05-09 ENCOUNTER — Encounter: Payer: Self-pay | Admitting: Radiation Oncology

## 2013-05-09 DIAGNOSIS — Z510 Encounter for antineoplastic radiation therapy: Secondary | ICD-10-CM | POA: Diagnosis not present

## 2013-05-09 DIAGNOSIS — R5383 Other fatigue: Secondary | ICD-10-CM | POA: Diagnosis not present

## 2013-05-09 DIAGNOSIS — M79609 Pain in unspecified limb: Secondary | ICD-10-CM | POA: Diagnosis not present

## 2013-05-09 DIAGNOSIS — Z7901 Long term (current) use of anticoagulants: Secondary | ICD-10-CM | POA: Diagnosis not present

## 2013-05-09 DIAGNOSIS — C50119 Malignant neoplasm of central portion of unspecified female breast: Secondary | ICD-10-CM | POA: Diagnosis not present

## 2013-05-09 DIAGNOSIS — C50911 Malignant neoplasm of unspecified site of right female breast: Secondary | ICD-10-CM

## 2013-05-09 DIAGNOSIS — C50919 Malignant neoplasm of unspecified site of unspecified female breast: Secondary | ICD-10-CM | POA: Diagnosis not present

## 2013-05-09 DIAGNOSIS — Z9221 Personal history of antineoplastic chemotherapy: Secondary | ICD-10-CM | POA: Diagnosis not present

## 2013-05-09 NOTE — Progress Notes (Signed)
   Department of Radiation Oncology  Phone:  [phone] Fax:        [phone]  Electron beam simulation note  Today the patient underwent additional planning for radiation therapy directed at the right chest wall area. Patient was placed in the treatment position on the linear accelerator treatment table. The patient's mastectomy scar was identified and she had set up of a custom electron cutout field directed at the mastectomy scar and surrounding area.  . This will be treated with 6 MeV electrons prescribed to the 90% isodose line. A 1/2 cm bolus will be placed daily during the patient's treatment. The patient will receive 5 additional treatments at 2 gray per fraction for an additional dose of 10 gray and a cumulative dose to the chest wall area of 60.4 gray. A special port plan is requested for treatment.  -----------------------------------  Billie Lade, PhD, MD

## 2013-05-10 ENCOUNTER — Ambulatory Visit
Admission: RE | Admit: 2013-05-10 | Discharge: 2013-05-10 | Disposition: A | Discharge status: Home / Self Care | Payer: Medicare Other | Source: Ambulatory Visit | Attending: Radiation Oncology | Admitting: Radiation Oncology

## 2013-05-10 VITALS — BP 121/72 | HR 76 | Temp 98.0°F | Ht 61.0 in | Wt 141.1 lb

## 2013-05-10 DIAGNOSIS — Z7901 Long term (current) use of anticoagulants: Secondary | ICD-10-CM | POA: Diagnosis not present

## 2013-05-10 DIAGNOSIS — C50119 Malignant neoplasm of central portion of unspecified female breast: Secondary | ICD-10-CM | POA: Diagnosis not present

## 2013-05-10 DIAGNOSIS — M79609 Pain in unspecified limb: Secondary | ICD-10-CM | POA: Diagnosis not present

## 2013-05-10 DIAGNOSIS — C50911 Malignant neoplasm of unspecified site of right female breast: Secondary | ICD-10-CM

## 2013-05-10 DIAGNOSIS — R5381 Other malaise: Secondary | ICD-10-CM | POA: Diagnosis not present

## 2013-05-10 DIAGNOSIS — Z510 Encounter for antineoplastic radiation therapy: Secondary | ICD-10-CM | POA: Diagnosis not present

## 2013-05-10 DIAGNOSIS — Z9221 Personal history of antineoplastic chemotherapy: Secondary | ICD-10-CM | POA: Diagnosis not present

## 2013-05-10 DIAGNOSIS — C50111 Malignant neoplasm of central portion of right female breast: Secondary | ICD-10-CM

## 2013-05-10 MED ORDER — RADIAPLEXRX EX GEL
Freq: Once | CUTANEOUS | Status: AC
  Administered 2013-05-10: 17:00:00 via TOPICAL

## 2013-05-10 NOTE — Progress Notes (Signed)
Jessica Wells here for weekly under treat visit.  She has had 28 fractions to her right chest wall.  She denies pain.  She has fatigue.  Her skin on her right chest is intact and red.  She is using radiaplex gel twice a day.  She was given a new tube today.

## 2013-05-10 NOTE — Progress Notes (Signed)
[facility]    Radiation Oncology [address] [place]     Maryln Gottron, M.D. [place], [place] [address]               Billie Lade, M.D., Ph.D. Phone: [phone]      Molli Hazard A. Kathrynn Running, M.D. Fax: [phone]      Radene Gunning, M.D., Ph.D.         Lurline Hare, M.D.         Grayland Jack, M.D Weekly Treatment Management Note  Name: Jessica Wells     MRN: [id number]        CSN: [id number] Date: 05/10/2013      DOB: 26-Apr-1946  CC: [name], MD         Zachery Dauer    Status: Outpatient  Diagnosis: The primary encounter diagnosis was Cancer of central portion of female breast, right. A diagnosis of Breast cancer, right was also pertinent to this visit.  Current Dose: 50.4 Gy  Current Fraction: 28  Planned Dose: 60.4 Gy  Narrative: Jessica Wells was seen today for weekly treatment management. The chart was checked and port films  were reviewed. She is having some fatigue. She has minimal itching and discomfort along the right chest wall area.  Anesthetics, amide; Bactrim; Sulfa antibiotics; Codeine; Codeine phosphate; and Eggs or egg-derived products  Current Outpatient Prescriptions  Medication Sig Dispense Refill  . b complex vitamins tablet Take 1 tablet by mouth daily.      . calcium-vitamin D (OSCAL WITH D) 500-200 MG-UNIT per tablet Take 1 tablet by mouth every other day.       . fish oil-omega-3 fatty acids 1000 MG capsule Take 2 g by mouth daily.      . Flaxseed, Linseed, (FLAX SEEDS PO) Take 1 tablet by mouth daily.      Marland Kitchen glucosamine-chondroitin 500-400 MG tablet Take 1 tablet by mouth daily.      . hyaluronate sodium (RADIAPLEXRX) GEL Apply topically 2 (two) times daily.      Marland Kitchen lidocaine-prilocaine (EMLA) cream Apply topically as needed.  30 g  5  . metoprolol succinate (TOPROL-XL) 50 MG 24 hr tablet Take 50 mg by mouth daily before breakfast. Take with or immediately following a meal.      . Multiple Vitamin (MULTIVITAMIN WITH  MINERALS) TABS Take 1 tablet by mouth every other day.       Marland Kitchen omeprazole (PRILOSEC) 20 MG capsule Take 20 mg by mouth daily. For heartburn      . ondansetron (ZOFRAN) 8 MG tablet Take 8 mg by mouth every 8 (eight) hours as needed for nausea.       Marland Kitchen PROVENTIL HFA 108 (90 BASE) MCG/ACT inhaler Inhale 2 puffs into the lungs every 4 (four) hours as needed.       . warfarin (COUMADIN) 5 MG tablet Take one 5 mg tablet by mouth on Tuesday and Friday and 7.5 mg all other days or as directed by physician.  60 tablet  1  . ciprofloxacin (CIPRO) 500 MG tablet       . non-metallic deodorant (ALRA) MISC Apply 1 application topically daily as needed.       No current facility-administered medications for this encounter.   Labs:  Lab Results  Component Value Date   WBC 4.9 04/29/2013   HGB 11.2* 04/29/2013   HCT 33.1* 04/29/2013   MCV 86.9 04/29/2013   PLT 160 04/29/2013   Lab Results  Component Value Date  CREATININE 0.9 04/29/2013   BUN 25.0 04/29/2013   NA 140 04/29/2013   K 3.9 04/29/2013   CL 104 04/29/2013   CO2 29 04/29/2013   Lab Results  Component Value Date   ALT 27 04/29/2013   AST 22 04/29/2013   PHOS 4.7* 01/04/2013   BILITOT 0.35 04/29/2013    Physical Examination:  height is 5\' 1"  (1.549 m) and weight is 141 lb 1.6 oz (64.003 kg). Her temperature is 98 F (36.7 C). Her blood pressure is 121/72 and her pulse is 76.    Wt Readings from Last 3 Encounters:  05/10/13 141 lb 1.6 oz (64.003 kg)  05/03/13 139 lb 6.4 oz (63.231 kg)  05/03/13 138 lb 6.4 oz (62.778 kg)    The right chest wall area shows erythema and hyperpigmentation changes but no skin breakdown is appreciated. Lungs - Normal respiratory effort, chest expands symmetrically. Lungs are clear to auscultation, no crackles or wheezes.  Heart has regular rhythm and rate  Abdomen is soft and non tender with normal bowel sounds  Assessment:  Patient tolerating treatments well  Plan: Continue treatment per original radiation  prescription

## 2013-05-11 ENCOUNTER — Ambulatory Visit
Admission: RE | Admit: 2013-05-11 | Discharge: 2013-05-11 | Disposition: A | Discharge status: Home / Self Care | Payer: Medicare Other | Source: Ambulatory Visit | Attending: Radiation Oncology | Admitting: Radiation Oncology

## 2013-05-11 DIAGNOSIS — Z7901 Long term (current) use of anticoagulants: Secondary | ICD-10-CM | POA: Diagnosis not present

## 2013-05-11 DIAGNOSIS — C50119 Malignant neoplasm of central portion of unspecified female breast: Secondary | ICD-10-CM | POA: Diagnosis not present

## 2013-05-11 DIAGNOSIS — M79609 Pain in unspecified limb: Secondary | ICD-10-CM | POA: Diagnosis not present

## 2013-05-11 DIAGNOSIS — R5381 Other malaise: Secondary | ICD-10-CM | POA: Diagnosis not present

## 2013-05-11 DIAGNOSIS — Z510 Encounter for antineoplastic radiation therapy: Secondary | ICD-10-CM | POA: Diagnosis not present

## 2013-05-11 DIAGNOSIS — Z9221 Personal history of antineoplastic chemotherapy: Secondary | ICD-10-CM | POA: Diagnosis not present

## 2013-05-12 ENCOUNTER — Ambulatory Visit
Admission: RE | Admit: 2013-05-12 | Discharge: 2013-05-12 | Disposition: A | Discharge status: Home / Self Care | Payer: Medicare Other | Source: Ambulatory Visit | Attending: Radiation Oncology | Admitting: Radiation Oncology

## 2013-05-12 DIAGNOSIS — M79609 Pain in unspecified limb: Secondary | ICD-10-CM | POA: Diagnosis not present

## 2013-05-12 DIAGNOSIS — Z9221 Personal history of antineoplastic chemotherapy: Secondary | ICD-10-CM | POA: Diagnosis not present

## 2013-05-12 DIAGNOSIS — R5381 Other malaise: Secondary | ICD-10-CM | POA: Diagnosis not present

## 2013-05-12 DIAGNOSIS — Z7901 Long term (current) use of anticoagulants: Secondary | ICD-10-CM | POA: Diagnosis not present

## 2013-05-12 DIAGNOSIS — Z510 Encounter for antineoplastic radiation therapy: Secondary | ICD-10-CM | POA: Diagnosis not present

## 2013-05-12 DIAGNOSIS — C50119 Malignant neoplasm of central portion of unspecified female breast: Secondary | ICD-10-CM | POA: Diagnosis not present

## 2013-05-13 ENCOUNTER — Ambulatory Visit
Admission: RE | Admit: 2013-05-13 | Discharge: 2013-05-13 | Disposition: A | Discharge status: Home / Self Care | Payer: Medicare Other | Source: Ambulatory Visit | Attending: Radiation Oncology | Admitting: Radiation Oncology

## 2013-05-13 DIAGNOSIS — Z510 Encounter for antineoplastic radiation therapy: Secondary | ICD-10-CM | POA: Diagnosis not present

## 2013-05-13 DIAGNOSIS — Z7901 Long term (current) use of anticoagulants: Secondary | ICD-10-CM | POA: Diagnosis not present

## 2013-05-13 DIAGNOSIS — C50119 Malignant neoplasm of central portion of unspecified female breast: Secondary | ICD-10-CM | POA: Diagnosis not present

## 2013-05-13 DIAGNOSIS — R5383 Other fatigue: Secondary | ICD-10-CM | POA: Diagnosis not present

## 2013-05-13 DIAGNOSIS — M79609 Pain in unspecified limb: Secondary | ICD-10-CM | POA: Diagnosis not present

## 2013-05-13 DIAGNOSIS — C50919 Malignant neoplasm of unspecified site of unspecified female breast: Secondary | ICD-10-CM | POA: Diagnosis not present

## 2013-05-13 DIAGNOSIS — Z9221 Personal history of antineoplastic chemotherapy: Secondary | ICD-10-CM | POA: Diagnosis not present

## 2013-05-16 ENCOUNTER — Ambulatory Visit
Admission: RE | Admit: 2013-05-16 | Discharge: 2013-05-16 | Disposition: A | Discharge status: Home / Self Care | Payer: Medicare Other | Source: Ambulatory Visit | Attending: Radiation Oncology | Admitting: Radiation Oncology

## 2013-05-16 DIAGNOSIS — R5381 Other malaise: Secondary | ICD-10-CM | POA: Diagnosis not present

## 2013-05-16 DIAGNOSIS — Z510 Encounter for antineoplastic radiation therapy: Secondary | ICD-10-CM | POA: Diagnosis not present

## 2013-05-16 DIAGNOSIS — Z9221 Personal history of antineoplastic chemotherapy: Secondary | ICD-10-CM | POA: Diagnosis not present

## 2013-05-16 DIAGNOSIS — Z7901 Long term (current) use of anticoagulants: Secondary | ICD-10-CM | POA: Diagnosis not present

## 2013-05-16 DIAGNOSIS — M79609 Pain in unspecified limb: Secondary | ICD-10-CM | POA: Diagnosis not present

## 2013-05-16 DIAGNOSIS — C50119 Malignant neoplasm of central portion of unspecified female breast: Secondary | ICD-10-CM | POA: Diagnosis not present

## 2013-05-17 ENCOUNTER — Encounter: Payer: Self-pay | Admitting: Radiation Oncology

## 2013-05-17 ENCOUNTER — Ambulatory Visit
Admission: RE | Admit: 2013-05-17 | Discharge: 2013-05-17 | Disposition: A | Discharge status: Home / Self Care | Payer: Medicare Other | Source: Ambulatory Visit | Attending: Radiation Oncology | Admitting: Radiation Oncology

## 2013-05-17 VITALS — BP 126/65 | HR 73 | Resp 18 | Wt 139.2 lb

## 2013-05-17 DIAGNOSIS — Z7901 Long term (current) use of anticoagulants: Secondary | ICD-10-CM | POA: Diagnosis not present

## 2013-05-17 DIAGNOSIS — M79609 Pain in unspecified limb: Secondary | ICD-10-CM | POA: Diagnosis not present

## 2013-05-17 DIAGNOSIS — R5383 Other fatigue: Secondary | ICD-10-CM | POA: Diagnosis not present

## 2013-05-17 DIAGNOSIS — C50119 Malignant neoplasm of central portion of unspecified female breast: Secondary | ICD-10-CM | POA: Diagnosis not present

## 2013-05-17 DIAGNOSIS — Z510 Encounter for antineoplastic radiation therapy: Secondary | ICD-10-CM | POA: Diagnosis not present

## 2013-05-17 DIAGNOSIS — C50911 Malignant neoplasm of unspecified site of right female breast: Secondary | ICD-10-CM

## 2013-05-17 DIAGNOSIS — Z9221 Personal history of antineoplastic chemotherapy: Secondary | ICD-10-CM | POA: Diagnosis not present

## 2013-05-17 NOTE — Progress Notes (Signed)
Hyperpigmentation worse under the right axilla noted within treatment field without desquamation. Reports using radiaplex bid as directed. Today completes her treatment. Advised to continue radiaplex bid for two weeks then transition to OTC moisturizer. Educated patient reference FYNN and ABC. Advised to contact staff with needs. Follow up appointment card given for one month.

## 2013-05-17 NOTE — Progress Notes (Signed)
Weekly Management Note Current Dose:  58.4 Gy  Projected Dose:60.4  Gy   Narrative:  The patient presents for routine under treatment assessment.  CBCT/MVCT images/Port film x-rays were reviewed.  The chart was checked. Doing well. C/o pain under right axilla. Fatigue.  Looking forward to being done with treatment.   Physical Findings: Weight: 139 lb 3.2 oz (63.141 kg). Dry desquamation over right chest wall. No moist desquamation.   Impression:  The patient is tolerating radiation.  Plan:  Continue treatment as planned. Finishes RT tomorrow. Discussed follow up in 1 month. Has f/u with [place] in [place]. Call with questions.

## 2013-05-19 NOTE — Progress Notes (Signed)
  Radiation Oncology         [phone] ________________________________  Name: Jessica Wells MRN: [id number]  Date: 05/17/2013  DOB: [birth year]  End of Treatment Note  Diagnosis:   Inflammatory right breast cancer     Indication for treatment:  Curative       Radiation treatment dates:   03/31/2013-05/17/2013  Site/dose:   Site/dose:    Right breast / 45 Gray @ 1.8 Wallace Cullens per fraction x 25 fractions Right supraclavicular fossa / 45 Gy @1 .8 Gy per fraction x 25 fractions Right posterior axillary boost Right breast boost / 16 Gray at [employer] per fraction x 8 fractions  Beams/energy:  Opposed Tangents / 6 MV photons LAO / 6 MV photons RP)/6MV photons Wedge pair / 6 and 10 MV photons  Narrative: The patient tolerated radiation treatment relatively well.   He had the expected dry desquamation over her chest wall.  Plan: The patient has completed radiation treatment. The patient will return to radiation oncology clinic for routine followup in one month. I advised them to call or return sooner if they have any questions or concerns related to their recovery or treatment.  ------------------------------------------------  Lurline Hare, MD

## 2013-05-19 NOTE — Progress Notes (Signed)
  Radiation Oncology         [phone]) [phone] ________________________________  Name: Jessica Wells MRN: [id number]  Date: 03/30/2013  DOB: June 13, 1946  Simulation Verification Note  Status: outpatient  NARRATIVE: The patient was brought to the treatment unit and placed in the planned treatment position. The clinical setup was verified. Then port films were obtained and uploaded to the radiation oncology medical record software.  The treatment beams were carefully compared against the planned radiation fields. The position location and shape of the radiation fields was reviewed. The targeted volume of tissue appears appropriately covered by the radiation beams. Organs at risk appear to be excluded as planned.  Based on my personal review, I approved the simulation verification. The patient's treatment will proceed as planned.  ------------------------------------------------  Lurline Hare, MD

## 2013-05-20 ENCOUNTER — Ambulatory Visit (HOSPITAL_BASED_OUTPATIENT_CLINIC_OR_DEPARTMENT_OTHER): Payer: Medicare Other

## 2013-05-20 ENCOUNTER — Encounter: Payer: Self-pay | Admitting: Adult Health

## 2013-05-20 ENCOUNTER — Telehealth: Payer: Self-pay | Admitting: Oncology

## 2013-05-20 ENCOUNTER — Telehealth: Payer: Self-pay | Admitting: *Deleted

## 2013-05-20 ENCOUNTER — Other Ambulatory Visit (HOSPITAL_BASED_OUTPATIENT_CLINIC_OR_DEPARTMENT_OTHER): Payer: Medicare Other | Admitting: Lab

## 2013-05-20 ENCOUNTER — Ambulatory Visit (HOSPITAL_BASED_OUTPATIENT_CLINIC_OR_DEPARTMENT_OTHER): Payer: Medicare Other | Admitting: Adult Health

## 2013-05-20 VITALS — BP 137/74 | HR 69 | Temp 97.6°F | Resp 20 | Ht 61.0 in | Wt 139.9 lb

## 2013-05-20 DIAGNOSIS — C50112 Malignant neoplasm of central portion of left female breast: Secondary | ICD-10-CM

## 2013-05-20 DIAGNOSIS — E7800 Pure hypercholesterolemia, unspecified: Secondary | ICD-10-CM

## 2013-05-20 DIAGNOSIS — C50919 Malignant neoplasm of unspecified site of unspecified female breast: Secondary | ICD-10-CM

## 2013-05-20 DIAGNOSIS — I82409 Acute embolism and thrombosis of unspecified deep veins of unspecified lower extremity: Secondary | ICD-10-CM | POA: Diagnosis not present

## 2013-05-20 DIAGNOSIS — I2699 Other pulmonary embolism without acute cor pulmonale: Secondary | ICD-10-CM

## 2013-05-20 DIAGNOSIS — Z5112 Encounter for antineoplastic immunotherapy: Secondary | ICD-10-CM

## 2013-05-20 DIAGNOSIS — C773 Secondary and unspecified malignant neoplasm of axilla and upper limb lymph nodes: Secondary | ICD-10-CM

## 2013-05-20 DIAGNOSIS — R7301 Impaired fasting glucose: Secondary | ICD-10-CM

## 2013-05-20 DIAGNOSIS — C50111 Malignant neoplasm of central portion of right female breast: Secondary | ICD-10-CM

## 2013-05-20 DIAGNOSIS — I82403 Acute embolism and thrombosis of unspecified deep veins of lower extremity, bilateral: Secondary | ICD-10-CM

## 2013-05-20 LAB — CBC WITH DIFFERENTIAL/PLATELET
BASO%: 0.4 % (ref 0.0–2.0)
Basophils Absolute: 0 10*3/uL (ref 0.0–0.1)
EOS%: 4.6 % (ref 0.0–7.0)
HCT: 30.5 % — ABNORMAL LOW (ref 34.8–46.6)
HGB: 10.5 g/dL — ABNORMAL LOW (ref 11.6–15.9)
LYMPH%: 18.1 % (ref 14.0–49.7)
MCH: 29.7 pg (ref 25.1–34.0)
MCHC: 34.4 g/dL (ref 31.5–36.0)
NEUT%: 67.3 % (ref 38.4–76.8)
Platelets: 181 10*3/uL (ref 145–400)

## 2013-05-20 LAB — COMPREHENSIVE METABOLIC PANEL (CC13)
AST: 20 U/L (ref 5–34)
Albumin: 3.6 g/dL (ref 3.5–5.0)
Alkaline Phosphatase: 67 U/L (ref 40–150)
BUN: 31.1 mg/dL — ABNORMAL HIGH (ref 7.0–26.0)
Calcium: 9.3 mg/dL (ref 8.4–10.4)
Chloride: 105 mEq/L (ref 98–107)
Creatinine: 1 mg/dL (ref 0.6–1.1)
Glucose: 92 mg/dl (ref 70–99)

## 2013-05-20 LAB — HEMOGLOBIN A1C: Hgb A1c MFr Bld: 5.9 % — ABNORMAL HIGH (ref <5.7)

## 2013-05-20 LAB — LIPID PANEL
HDL: 45 mg/dL (ref >39)
Total CHOL/HDL Ratio: 4.8 Ratio
Triglycerides: 226 mg/dL — ABNORMAL HIGH (ref <150)

## 2013-05-20 MED ORDER — SODIUM CHLORIDE 0.9 % IJ SOLN
10.0000 mL | INTRAMUSCULAR | Status: DC | PRN
Start: 2013-05-20 — End: 2013-05-20
  Administered 2013-05-20: 10 mL
  Filled 2013-05-20: qty 10

## 2013-05-20 MED ORDER — SODIUM CHLORIDE 0.9 % IV SOLN
Freq: Once | INTRAVENOUS | Status: AC
  Administered 2013-05-20: 16:00:00 via INTRAVENOUS

## 2013-05-20 MED ORDER — TRASTUZUMAB CHEMO INJECTION 440 MG
6.0000 mg/kg | Freq: Once | INTRAVENOUS | Status: AC
  Administered 2013-05-20: 378 mg via INTRAVENOUS
  Filled 2013-05-20: qty 18

## 2013-05-20 MED ORDER — ACETAMINOPHEN 325 MG PO TABS
650.0000 mg | ORAL_TABLET | Freq: Once | ORAL | Status: AC
  Administered 2013-05-20: 650 mg via ORAL

## 2013-05-20 MED ORDER — HEPARIN SOD (PORK) LOCK FLUSH 100 UNIT/ML IV SOLN
500.0000 [IU] | Freq: Once | INTRAVENOUS | Status: AC | PRN
  Administered 2013-05-20: 500 [IU]
  Filled 2013-05-20: qty 5

## 2013-05-20 NOTE — Patient Instructions (Signed)
[facility] Discharge Instructions for Patients Receiving Chemotherapy  Today you received the following chemotherapy agents: herceptin  To help prevent nausea and vomiting after your treatment, we encourage you to take your nausea medication.  Take it as often as prescribed.     If you develop nausea and vomiting that is not controlled by your nausea medication, call the clinic. If it is after clinic hours your family physician or the after hours number for the clinic or go to the Emergency Department.   BELOW ARE SYMPTOMS THAT SHOULD BE REPORTED IMMEDIATELY:  *FEVER GREATER THAN 100.5 F  *CHILLS WITH OR WITHOUT FEVER  NAUSEA AND VOMITING THAT IS NOT CONTROLLED WITH YOUR NAUSEA MEDICATION  *UNUSUAL SHORTNESS OF BREATH  *UNUSUAL BRUISING OR BLEEDING  TENDERNESS IN MOUTH AND THROAT WITH OR WITHOUT PRESENCE OF ULCERS  *URINARY PROBLEMS  *BOWEL PROBLEMS  UNUSUAL RASH Items with * indicate a potential emergency and should be followed up as soon as possible.  Feel free to call the clinic you have any questions or concerns. The clinic phone number is [phone].   I have been informed and understand all the instructions given to me. I know to contact the clinic, my physician, or go to the Emergency Department if any problems should occur. I do not have any questions at this time, but understand that I may call the clinic during office hours   should I have any questions or need assistance in obtaining follow up care.    __________________________________________  _____________  __________ Signature of Patient or Authorized Representative            Date                   Time    __________________________________________ Nurse's Signature    

## 2013-05-20 NOTE — Progress Notes (Signed)
[facility]  [phone] [phone]  OFFICE PROGRESS NOTE   PATIENT: Jessica Wells   DOB: [birth year]  MR#: [id number]  JYN#:[id number]   QM:VHQION,GEXBMWUXL STEWART, MD Lurline Hare, MD Emelia Loron, MD Arvilla Meres, MD Charlaine Dalton. Sherene Sires, MD    DIAGNOSIS:  67 year-old [place], [place] woman with a recent diagnosis of inflammatory breast cancer.   PRIOR THERAPY: 1.  The patient was originally seen in clinic for new diagnosis of inflammatory breast cancer, she was referred by Dr. Emelia Loron. She had a mammogram performed on 07/14/2012 that showed an abnormality. That was biopsied and it showed an invasive mammary carcinoma with lymphovascular invasion grade 3 ER negative PR negative HER-2/neu positive.   2.  The patient had a MRI of the breasts performed on 07/19/2012.  The MRI showed diffuse right breast neoplasm on a large level I right axillary lymph nodes compatible with lymphatic spread.  The patient has had a biopsy of the right axillary lymph node that is compatible with invasive mammary carcinoma.   3.  The patient began neoadjuvant FEC 100 with day 2 Neulasta support on 07/23/2012.   4.   Weekly neoadjuvant Taxol and Herceptin started on 09/17/12.  The patient developed neuropathies, and Taxol was discontinued early.  She completed 7 weeks of Taxol/Herceptin combination therapy.  The patient received 2 weeks of Herceptin only.   5. Weekly Gemcitabine and Herceptin was started on 11/19/12 and completed on 12/17/2012 after 5 cycles.  6.  A regimen of Lasix 20 mg PO and Kdur 10 mEq was started on 01/07/2013 due to chronic lower extremity edema.  Medications are only to be taken if patient has a weight gain of 5 lbs or more in a day (daily weights).   7.  Anemia was treated with blood transfusions of 1 unit of PRBCs given on 01/11/2013 and another unit of PRBC's given 01/12/2013.  8. Patient underwent right mastectomy on  02/08/13.  Pathology showed a complete response to chemotherapy in the breast and 11 nodes identified with fibrosis but no carcinoma.    9.  Radiation therapy from 03/31/13 through 05/17/13.  10.  Every 3 week herceptin started 02/25/13.    CURRENT THERAPY:  Herceptin every 3 weeks  INTERVAL HISTORY: Jessica Wells is doing well today.  She is here for her every 3 week Herceptin.  She has completed radiation.  She is feeling well.  She denies fevers, chills, nausea, vomiting, constipation, diarrhea, shortness of breath, chest pain, palpitations, orthopnea, DOE, or any other concerns.  A 10 point ROS was negative.    PAST MEDICAL HISTORY: Past Medical History  Diagnosis Date  . GERD (gastroesophageal reflux disease)   . Dysrhythmia     PAT-sees dr Laurence Compton meds  . Breast cancer 07/14/12    inflammatory right breast ca, ER/PR -  . Allergy   . PAT (paroxysmal atrial tachycardia)     hx  . Arthritis     osteopenia,knees  . PONV (postoperative nausea and vomiting) 09-13-12    severe, with Port-a-cath, was managed without PONV  . Clotting disorder     prothrombin gene mutation-heterozygous   . History of chemotherapy     Last dose to be 02-04-13.  Was rx'd with Herceptin & Gemzar  . DVT (deep venous thrombosis)   . Asthma     Uses Inhalers Proventil as needed  . Bursitis 01-25-13    Rt. shoulder- mildly affected now.  . History of blood transfusion 01-25-13  2 units 01-11-13  . Pneumonia 01-25-13    hx. 12-27-12-hospital stay x 9 days, now resolved.    PAST SURGICAL HISTORY: Past Surgical History  Procedure Laterality Date  . Dilation and curettage of uterus      x3  . Tonsillectomy    . Colonoscopy    . Portacath placement  07/21/2012    Procedure: INSERTION PORT-A-CATH;  Surgeon: Emelia Loron, MD;  Location:  SURGERY CENTER;  Service: General;  Laterality: Left;/ Replacement done 10'13  . Insertion of vena cava filter  12/2012  . Giant cell tumor  01-25-13    removed Rt.  forearm.  . Eye lid surgery      right( MD office)  . Mastectomy modified radical Right 02/08/2013    Procedure: MASTECTOMY MODIFIED RADICAL;  Surgeon: Emelia Loron, MD;  Location: [facility];  Service: General;  Laterality: Right;  . Breast surgery      FAMILY HISTORY: Family History  Problem Relation Age of Onset  . Diabetes Mother   . Heart disease Mother   . Hypertension Mother   . Hyperlipidemia Mother   . Breast cancer Mother 7    lobular breast cancer  . Heart disease Maternal Aunt   . Heart disease Maternal Uncle   . Heart disease Maternal Grandmother     died in her 63s from heart disease  . Lung cancer Father 60    adenocarcinoma  . Cancer Paternal Uncle     dx in mid 83s with a cancer in the leg, died late 86s; grandpaternal half uncle    SOCIAL HISTORY: History  Substance Use Topics  . Smoking status: Former Smoker -- 1.00 packs/day for 35 years    Types: Cigarettes    Quit date: 07/21/1979  . Smokeless tobacco: Never Used  . Alcohol Use: No    ALLERGIES: Allergies  Allergen Reactions  . Anesthetics, Amide Nausea And Vomiting    Projectile vomitting-Nausea- with 24hrs of dry heaves. With any anesthetics  . Bactrim (Sulfamethoxazole W-Trimethoprim) Other (See Comments)    Massive diarrhea, nausea vomiting, platelets dropped, hemoglobin dropped, admitted to hospital  . Sulfa Antibiotics Nausea And Vomiting and Other (See Comments)    Crazy feeling in head Life threatening reaction, decreased blood counts  . Codeine Nausea And Vomiting  . Codeine Phosphate Nausea And Vomiting  . Eggs Or Egg-Derived Products Hives     MEDICATIONS:  Current Outpatient Prescriptions  Medication Sig Dispense Refill  . b complex vitamins tablet Take 1 tablet by mouth daily.      . calcium-vitamin D (OSCAL WITH D) 500-200 MG-UNIT per tablet Take 1 tablet by mouth every other day.       . fish oil-omega-3 fatty acids 1000 MG capsule Take 2 g by mouth daily.      .  Flaxseed, Linseed, (FLAX SEEDS PO) Take 1 tablet by mouth daily.      Marland Kitchen glucosamine-chondroitin 500-400 MG tablet Take 1 tablet by mouth daily.      . hyaluronate sodium (RADIAPLEXRX) GEL Apply topically 2 (two) times daily.      Marland Kitchen lidocaine-prilocaine (EMLA) cream Apply topically as needed.  30 g  5  . metoprolol succinate (TOPROL-XL) 50 MG 24 hr tablet Take 50 mg by mouth daily before breakfast. Take with or immediately following a meal.      . Multiple Vitamin (MULTIVITAMIN WITH MINERALS) TABS Take 1 tablet by mouth every other day.       . non-metallic deodorant Thornton Papas) MISC  Apply 1 application topically daily as needed.      Marland Kitchen omeprazole (PRILOSEC) 20 MG capsule Take 20 mg by mouth daily. For heartburn      . ondansetron (ZOFRAN) 8 MG tablet Take 8 mg by mouth every 8 (eight) hours as needed for nausea.       Marland Kitchen PROVENTIL HFA 108 (90 BASE) MCG/ACT inhaler Inhale 2 puffs into the lungs every 4 (four) hours as needed.       . warfarin (COUMADIN) 5 MG tablet Take one 5 mg tablet by mouth on Tuesday and Friday and 7.5 mg all other days or as directed by physician.  60 tablet  1   No current facility-administered medications for this visit.      REVIEW OF SYSTEMS: A 10 point review of systems was completed and is negative except as noted above.    PHYSICAL EXAMINATION: BP 137/74  Pulse 69  Temp(Src) 97.6 F (36.4 C) (Oral)  Resp 20  Ht 5\' 1"  (1.549 m)  Wt 139 lb 14.4 oz (63.458 kg)  BMI 26.45 kg/m2     General appearance: Alert, cooperative, well nourished, no apparent distress Head:  Atraumatic, normocephalic Eyes: Conjunctivae/corneas clear, PERRLA, EOMI Nose: No drainage or sinus tenderness Neck: No adenopathy, supple, symmetrical, trachea midline, thyroid not enlarged, no tenderness Resp: Clear to auscultation bilaterally, diminished bibasilar breath sounds Cardio: Regular rate and rhythm, S1, S2 normal, no murmur, click, rub or gallop, left chest Port-A-Cath covered with EMLA  cream Breasts: right mastectomy site with erythema and moist desquamation, left breast no masses or nodules GI: Soft, distended, non-tender, hypoactive bowel sounds, no organomegaly Extremities: Extremities normal, atraumatic, no cyanosis, LE edema, LLE (+1)  is larger than RLE (trace edema), mild swelling in left upper extremity in forearm.   Lymph nodes: Cervical, supraclavicular, and axillary nodes normal Neurologic: Grossly normal  ECOG FS:  Grade 2 - Symptomatic, but completely ambulatory  LAB RESULTS: Lab Results  Component Value Date   WBC 4.9 04/29/2013   NEUTROABS 3.5 04/29/2013   HGB 11.2* 04/29/2013   HCT 33.1* 04/29/2013   MCV 86.9 04/29/2013   PLT 160 04/29/2013      Chemistry      Component Value Date/Time   NA 140 04/29/2013 1153   NA 140 02/10/2013 0400   K 3.9 04/29/2013 1153   K 3.4* 02/10/2013 0400   CL 104 04/29/2013 1153   CL 106 02/10/2013 0400   CO2 29 04/29/2013 1153   CO2 26 02/10/2013 0400   BUN 25.0 04/29/2013 1153   BUN 11 02/10/2013 0400   CREATININE 0.9 04/29/2013 1153   CREATININE 0.84 02/10/2013 0400      Component Value Date/Time   CALCIUM 9.2 04/29/2013 1153   CALCIUM 8.7 02/10/2013 0400   ALKPHOS 66 04/29/2013 1153   ALKPHOS 66 01/28/2013 1630   AST 22 04/29/2013 1153   AST 36 01/28/2013 1630   ALT 27 04/29/2013 1153   ALT 55* 01/28/2013 1630   BILITOT 0.35 04/29/2013 1153   BILITOT 0.2* 01/28/2013 1630      Lab Results  Component Value Date   LABCA2 16 07/19/2012     RADIOGRAPHIC STUDIES: No results found.  ASSESSMENT: 67 y.o. [place], [place] woman with:  1.  Inflammatory right breast cancer with positive lymph nodes the tumor is ER negative PR negative HER-2/neu positive.  Patient is s/p neoadjuvant chemotherapy consisting of FEC q 2 weeks x 4 cycles.  Status post weekly Gemcitabine and Herceptin that was started  on 11/19/12 and completed on 12/17/2012 after 5 cycles.   Herceptin Qweekly x 12 weeks started on 01/14/2013 and she will receive a Herceptin  infusion today.  Patient underwent right mastectomy by Dr. Dwain Sarna on 02/08/13 with complete pathologic response to neoadjuvant chemotherapy. Adjuvant Herceptin every 3 weeks started on 02/25/13, and radiation therapy with Dr. Michell Heinrich began on 03/31/13.   2.  Bilateral lower extremity DVT and Bilateral PE.  The patient has a positive Prothrombin Gene Mutation, heterozygous. S/p IVC filter inserted on  12/27/2012. She will continue on Coumadin and take 7.5 mg three nights per week and 5mg  4 nights per week.   3.  Lower extremity edema-near resolved today.   PLAN:  1.  Bilateral DVT and PE.  INR is stable.  She will continue her current dose.    2. Patient will proceed with Herceptin today.   3. Jessica Wells will return in 3 weeks for her next cycle of Herceptin.  I also requested f/u with Dr. Gala Romney at the end of July.  Her last echo was 4/21.      All questions were answered.  The patient was encouraged to contact us in the interim with any problems, questions or concerns.   I spent 25 minutes counseling the patient face to face.  The total time spent in the appointment was 30 minutes.  Cherie Ouch Lyn Hollingshead, NP Medical Oncology [facility] Phone: [phone] 05/20/2013, 2:55 PM

## 2013-05-20 NOTE — Telephone Encounter (Signed)
Per staff message and POF I have scheduled appts.  JMW  

## 2013-05-20 NOTE — Patient Instructions (Addendum)
Doing well.  Proceed with Herceptin.  Please call us if you have any questions or concerns.    

## 2013-05-22 NOTE — Progress Notes (Signed)
[facility]  [phone] [phone]  OFFICE PROGRESS NOTE   PATIENT: Jessica Wells   DOB: 1946-09-22  MR#: [id number]  JYN#:[id number]   QM:VHQION,GEXBMWUXL STEWART, MD Jessica Hare, MD Jessica Loron, MD Jessica Meres, MD Jessica Wells. Jessica Sires, MD    DIAGNOSIS:  67 year-old [place], [place] woman with a recent diagnosis of inflammatory breast cancer.   PRIOR THERAPY: 1.  The patient was originally seen in clinic for new diagnosis of inflammatory breast cancer, she was referred by Dr. Emelia Wells. She had a mammogram performed on 07/14/2012 that showed an abnormality. That was biopsied and it showed an invasive mammary carcinoma with lymphovascular invasion grade 3 ER negative PR negative HER-2/neu positive.   2.  The patient had a MRI of the breasts performed on 07/19/2012.  The MRI showed diffuse right breast neoplasm on a large level I right axillary lymph nodes compatible with lymphatic spread.  The patient has had a biopsy of the right axillary lymph node that is compatible with invasive mammary carcinoma.   3.  The patient began neoadjuvant FEC 100 with day 2 Neulasta support on 07/23/2012.   4.   Weekly neoadjuvant Taxol and Herceptin started on 09/17/12.  The patient developed neuropathies, and Taxol was discontinued early.  She completed 7 weeks of Taxol/Herceptin combination therapy.  The patient received 2 weeks of Herceptin only.   5. Weekly Gemcitabine and Herceptin was started on 11/19/12 and completed on 12/17/2012 after 5 cycles.  6.  A regimen of Lasix 20 mg PO and Kdur 10 mEq was started on 01/07/2013 due to chronic lower extremity edema.  Medications are only to be taken if patient has a weight gain of 5 lbs or more in a day (daily weights).   7.  Anemia was treated with blood transfusions of 1 unit of PRBCs given on 01/11/2013 and another unit of PRBC's given 01/12/2013.  8. Patient underwent right mastectomy on  02/08/13.  Pathology showed a complete response to chemotherapy in the breast and 11 nodes identified with fibrosis but no carcinoma.    9.  Patient began radiation therapy on 03/31/13.    10.  Every 3 week herceptin started 02/25/13.    CURRENT THERAPY:  Herceptin every 3 weeks  INTERVAL HISTORY: Jessica Wells is doing well today.  She is here for her every 3 week Herceptin.  She started radiation therapy on May 1.  She is tolerating it well thus far.  She however continues to have left arm swelling in her lower left forearm, and pain is now extending into her wrist.  Otherwise, she is feeling well and a 10 point ROS is neg.   PAST MEDICAL HISTORY: Past Medical History  Diagnosis Date  . GERD (gastroesophageal reflux disease)   . Dysrhythmia     PAT-sees dr Jessica Wells meds  . Breast cancer 07/14/12    inflammatory right breast ca, ER/PR -  . Allergy   . PAT (paroxysmal atrial tachycardia)     hx  . Arthritis     osteopenia,knees  . PONV (postoperative nausea and vomiting) 09-13-12    severe, with Port-a-cath, was managed without PONV  . Clotting disorder     prothrombin gene mutation-heterozygous   . History of chemotherapy     Last dose to be 02-04-13.  Was rx'd with Herceptin & Gemzar  . DVT (deep venous thrombosis)   . Asthma     Uses Inhalers Proventil as needed  . Bursitis 01-25-13  Rt. shoulder- mildly affected now.  . History of blood transfusion 01-25-13    2 units 01-11-13  . Pneumonia 01-25-13    hx. 12-27-12-hospital stay x 9 days, now resolved.    PAST SURGICAL HISTORY: Past Surgical History  Procedure Laterality Date  . Dilation and curettage of uterus      x3  . Tonsillectomy    . Colonoscopy    . Portacath placement  07/21/2012    Procedure: INSERTION PORT-A-CATH;  Surgeon: Jessica Loron, MD;  Location: [facility];  Service: General;  Laterality: Left;/ Replacement done 10'13  . Insertion of vena cava filter  12/2012  . Giant cell tumor  01-25-13     removed Rt. forearm.  . Eye lid surgery      right( MD office)  . Mastectomy modified radical Right 02/08/2013    Procedure: MASTECTOMY MODIFIED RADICAL;  Surgeon: Jessica Loron, MD;  Location: [facility];  Service: General;  Laterality: Right;  . Breast surgery      FAMILY HISTORY: Family History  Problem Relation Age of Onset  . Diabetes Mother   . Heart disease Mother   . Hypertension Mother   . Hyperlipidemia Mother   . Breast cancer Mother 74    lobular breast cancer  . Heart disease Maternal Aunt   . Heart disease Maternal Uncle   . Heart disease Maternal Grandmother     died in her 54s from heart disease  . Lung cancer Father 61    adenocarcinoma  . Cancer Paternal Uncle     dx in mid 57s with a cancer in the leg, died late 1s; grandpaternal half uncle    SOCIAL HISTORY: History  Substance Use Topics  . Smoking status: Former Smoker -- 1.00 packs/day for 35 years    Types: Cigarettes    Quit date: 07/21/1979  . Smokeless tobacco: Never Used  . Alcohol Use: No    ALLERGIES: Allergies  Allergen Reactions  . Anesthetics, Amide Nausea And Vomiting    Projectile vomitting-Nausea- with 24hrs of dry heaves. With any anesthetics  . Bactrim (Sulfamethoxazole W-Trimethoprim) Other (See Comments)    Massive diarrhea, nausea vomiting, platelets dropped, hemoglobin dropped, admitted to hospital  . Sulfa Antibiotics Nausea And Vomiting and Other (See Comments)    Crazy feeling in head Life threatening reaction, decreased blood counts  . Codeine Nausea And Vomiting  . Codeine Phosphate Nausea And Vomiting  . Eggs Or Egg-Derived Products Hives     MEDICATIONS:  Current Outpatient Prescriptions  Medication Sig Dispense Refill  . b complex vitamins tablet Take 1 tablet by mouth daily.      . calcium-vitamin D (OSCAL WITH D) 500-200 MG-UNIT per tablet Take 1 tablet by mouth every other day.       . fish oil-omega-3 fatty acids 1000 MG capsule Take 2 g by mouth daily.       . Flaxseed, Linseed, (FLAX SEEDS PO) Take 1 tablet by mouth daily.      Marland Kitchen glucosamine-chondroitin 500-400 MG tablet Take 1 tablet by mouth daily.      . hyaluronate sodium (RADIAPLEXRX) GEL Apply topically 2 (two) times daily.      . metoprolol succinate (TOPROL-XL) 50 MG 24 hr tablet Take 50 mg by mouth daily before breakfast. Take with or immediately following a meal.      . Multiple Vitamin (MULTIVITAMIN WITH MINERALS) TABS Take 1 tablet by mouth every other day.       . non-metallic deodorant Thornton Papas)  MISC Apply 1 application topically daily as needed.      Marland Kitchen omeprazole (PRILOSEC) 20 MG capsule Take 20 mg by mouth daily. For heartburn      . PROVENTIL HFA 108 (90 BASE) MCG/ACT inhaler Inhale 2 puffs into the lungs every 4 (four) hours as needed.       . warfarin (COUMADIN) 5 MG tablet Take one 5 mg tablet by mouth on Tuesday and Friday and 7.5 mg all other days or as directed by physician.  60 tablet  1  . lidocaine-prilocaine (EMLA) cream Apply topically as needed.  30 g  5  . ondansetron (ZOFRAN) 8 MG tablet Take 8 mg by mouth every 8 (eight) hours as needed for nausea.        No current facility-administered medications for this visit.      REVIEW OF SYSTEMS: A 10 point review of systems was completed and is negative except as noted above.    PHYSICAL EXAMINATION: BP 136/78  Pulse 74  Temp(Src) 98.3 F (36.8 C) (Oral)  Resp 20  Ht 5\' 1"  (1.549 m)  Wt 138 lb 4.8 oz (62.732 kg)  BMI 26.14 kg/m2     General appearance: Alert, cooperative, well nourished, no apparent distress Head:  Atraumatic, normocephalic Eyes: Conjunctivae/corneas clear, PERRLA, EOMI Nose: No drainage or sinus tenderness Neck: No adenopathy, supple, symmetrical, trachea midline, thyroid not enlarged, no tenderness Resp: Clear to auscultation bilaterally, diminished bibasilar breath sounds Cardio: Regular rate and rhythm, S1, S2 normal, no murmur, click, rub or gallop, left chest Port-A-Cath covered with  EMLA cream Breasts:right mastectomy site healing well, seroma in right axillary tissue, drains in place.  Left breast not examined today GI: Soft, distended, non-tender, hypoactive bowel sounds, no organomegaly Extremities: Extremities normal, atraumatic, no cyanosis, LE edema, LLE (+1)  is larger than RLE (trace edema), mild swelling in left upper extremity in forearm.   Lymph nodes: Cervical, supraclavicular, and axillary nodes normal Neurologic: Grossly normal  ECOG FS:  Grade 2 - Symptomatic, but completely ambulatory  LAB RESULTS: Lab Results  Component Value Date   WBC 4.6 05/20/2013   NEUTROABS 3.1 05/20/2013   HGB 10.5* 05/20/2013   HCT 30.5* 05/20/2013   MCV 86.4 05/20/2013   PLT 181 05/20/2013      Chemistry      Component Value Date/Time   NA 140 05/20/2013 1459   NA 140 02/10/2013 0400   K 3.8 05/20/2013 1459   K 3.4* 02/10/2013 0400   CL 105 05/20/2013 1459   CL 106 02/10/2013 0400   CO2 24 05/20/2013 1459   CO2 26 02/10/2013 0400   BUN 31.1* 05/20/2013 1459   BUN 11 02/10/2013 0400   CREATININE 1.0 05/20/2013 1459   CREATININE 0.84 02/10/2013 0400      Component Value Date/Time   CALCIUM 9.3 05/20/2013 1459   CALCIUM 8.7 02/10/2013 0400   ALKPHOS 67 05/20/2013 1459   ALKPHOS 66 01/28/2013 1630   AST 20 05/20/2013 1459   AST 36 01/28/2013 1630   ALT 26 05/20/2013 1459   ALT 55* 01/28/2013 1630   BILITOT 0.27 05/20/2013 1459   BILITOT 0.2* 01/28/2013 1630      Lab Results  Component Value Date   LABCA2 16 07/19/2012     RADIOGRAPHIC STUDIES: No results found.  ASSESSMENT: 67 y.o. [place], [place] woman with:  1.  Inflammatory right breast cancer with positive lymph nodes the tumor is ER negative PR negative HER-2/neu positive.  Patient is s/p neoadjuvant chemotherapy  consisting of FEC q 2 weeks x 4 cycles.  Status post weekly Gemcitabine and Herceptin that was started on 11/19/12 and completed on 12/17/2012 after 5 cycles.   Herceptin Qweekly x 12 weeks started on 01/14/2013 and  she will receive a Herceptin infusion today.  Patient underwent right mastectomy by Dr. Dwain Sarna on 02/08/13 with complete pathologic response to neoadjuvant chemotherapy. Adjuvant Herceptin every 3 weeks started on 02/25/13, and radiation therapy with Dr. Michell Heinrich began on 03/31/13.   2.  Bilateral lower extremity DVT and Bilateral PE  3.  Lower extremity edema   PLAN:  1.  Bilateral DVT and PE.  The patient has a positive Prothrombin Gene Mutation, heterozygous. S/p IVC filter inserted on  12/27/2012. She will continue on Coumadin and take 7.5 mg three nights per week and 5mg  4 nights per week.  Her INR remains stable. Though her INR is slightly decreased today, she has been eating more salads this week than usual. We will keep the dose the same, she will decrease her salad intake to her usual amount, and we will recheck next week.    2. Patient will proceed with Herceptin today.  She will continue radiation therapy.    3. The left arm swelling is stable,    All questions were answered.  The patient was encouraged to contact us in the interim with any problems, questions or concerns.   I spent 25 minutes counseling the patient face to face.  The total time spent in the appointment was 30 minutes.  Drue Second, MD Medical/Oncology [facility] [phone] (beeper) [phone] (Office)

## 2013-05-31 ENCOUNTER — Ambulatory Visit (INDEPENDENT_AMBULATORY_CARE_PROVIDER_SITE_OTHER): Payer: Medicare Other | Admitting: General Surgery

## 2013-05-31 ENCOUNTER — Encounter (INDEPENDENT_AMBULATORY_CARE_PROVIDER_SITE_OTHER): Payer: Self-pay | Admitting: General Surgery

## 2013-05-31 VITALS — BP 130/72 | HR 66 | Temp 97.8°F | Resp 16 | Ht 61.0 in | Wt 142.0 lb

## 2013-05-31 DIAGNOSIS — C50911 Malignant neoplasm of unspecified site of right female breast: Secondary | ICD-10-CM

## 2013-05-31 DIAGNOSIS — C50919 Malignant neoplasm of unspecified site of unspecified female breast: Secondary | ICD-10-CM | POA: Diagnosis not present

## 2013-05-31 NOTE — Progress Notes (Signed)
Subjective:     Patient ID: Jessica Wells, female   DOB: Aug 25, 1946, 67 y.o.   MRN: [id number]  HPI 67 yof who has right breast inflammatory cancer s/p mrm, adjuvant chemotherapy now on herceptin, who has now completed radiation therapy.  I saw her about a month ago and she was noted to have a left wrist mass. I sent her to see hand surgery and Dr Mina Marble is treating her for tendinitis. I still think this is just a lipoma at that location. She's not really concerned about it at this point any more. She otherwise reports that she has no real complaints. She very much would like to go and have a prosthetic fitting for now.   Review of Systems     Objective:   Physical Exam  Vitals reviewed. Constitutional: She appears well-developed and well-nourished.  Pulmonary/Chest: Right breast exhibits skin change (c/w radiation therapy). Right breast exhibits no mass and no tenderness. Left breast exhibits no inverted nipple, no mass, no nipple discharge, no skin change and no tenderness.  Lymphadenopathy:    She has no cervical adenopathy.    She has no axillary adenopathy.       Right: No supraclavicular adenopathy present.       Left: No supraclavicular adenopathy present.       Assessment:   Inflammatory left breast cancer Left wrist lipoma     Plan:     She has no clinical evidence of recurrence. We discussed the left wrist mass and I think it is fine just to watched at. She is very comfortable with this now. She understand she will have to come off her Coumadin there be a small risk of having more difficulties off of her Coumadin. I will send her over to get a prosthetic as I think she can at least a temporary one for now even though she just finished radiation. Otherwise I will plan on seeing her back in about 6 months or sooner if needed.

## 2013-06-10 ENCOUNTER — Encounter: Payer: Self-pay | Admitting: Adult Health

## 2013-06-10 ENCOUNTER — Ambulatory Visit (HOSPITAL_BASED_OUTPATIENT_CLINIC_OR_DEPARTMENT_OTHER): Payer: Medicare Other

## 2013-06-10 ENCOUNTER — Other Ambulatory Visit (HOSPITAL_BASED_OUTPATIENT_CLINIC_OR_DEPARTMENT_OTHER): Payer: Medicare Other | Admitting: Lab

## 2013-06-10 ENCOUNTER — Ambulatory Visit (HOSPITAL_BASED_OUTPATIENT_CLINIC_OR_DEPARTMENT_OTHER): Payer: Medicare Other | Admitting: Adult Health

## 2013-06-10 VITALS — BP 132/70 | Temp 98.4°F | Ht 61.0 in | Wt 142.7 lb

## 2013-06-10 DIAGNOSIS — Z171 Estrogen receptor negative status [ER-]: Secondary | ICD-10-CM

## 2013-06-10 DIAGNOSIS — C50111 Malignant neoplasm of central portion of right female breast: Secondary | ICD-10-CM

## 2013-06-10 DIAGNOSIS — C50919 Malignant neoplasm of unspecified site of unspecified female breast: Secondary | ICD-10-CM

## 2013-06-10 DIAGNOSIS — I82409 Acute embolism and thrombosis of unspecified deep veins of unspecified lower extremity: Secondary | ICD-10-CM

## 2013-06-10 DIAGNOSIS — I2699 Other pulmonary embolism without acute cor pulmonale: Secondary | ICD-10-CM | POA: Diagnosis not present

## 2013-06-10 DIAGNOSIS — R609 Edema, unspecified: Secondary | ICD-10-CM

## 2013-06-10 DIAGNOSIS — C773 Secondary and unspecified malignant neoplasm of axilla and upper limb lymph nodes: Secondary | ICD-10-CM | POA: Diagnosis not present

## 2013-06-10 DIAGNOSIS — Z5112 Encounter for antineoplastic immunotherapy: Secondary | ICD-10-CM

## 2013-06-10 DIAGNOSIS — I82403 Acute embolism and thrombosis of unspecified deep veins of lower extremity, bilateral: Secondary | ICD-10-CM

## 2013-06-10 DIAGNOSIS — C50112 Malignant neoplasm of central portion of left female breast: Secondary | ICD-10-CM

## 2013-06-10 LAB — CBC WITH DIFFERENTIAL/PLATELET
Basophils Absolute: 0 10*3/uL (ref 0.0–0.1)
Eosinophils Absolute: 0.1 10*3/uL (ref 0.0–0.5)
HCT: 30.4 % — ABNORMAL LOW (ref 34.8–46.6)
HGB: 10.6 g/dL — ABNORMAL LOW (ref 11.6–15.9)
MONO#: 0.4 10*3/uL (ref 0.1–0.9)
NEUT#: 4 10*3/uL (ref 1.5–6.5)
NEUT%: 72.1 % (ref 38.4–76.8)
WBC: 5.5 10*3/uL (ref 3.9–10.3)
lymph#: 1 10*3/uL (ref 0.9–3.3)

## 2013-06-10 LAB — COMPREHENSIVE METABOLIC PANEL (CC13)
AST: 25 U/L (ref 5–34)
Albumin: 3.5 g/dL (ref 3.5–5.0)
Alkaline Phosphatase: 73 U/L (ref 40–150)
Calcium: 9.3 mg/dL (ref 8.4–10.4)
Chloride: 105 mEq/L (ref 98–109)
Glucose: 121 mg/dl (ref 70–140)
Potassium: 3.9 mEq/L (ref 3.5–5.1)
Sodium: 139 mEq/L (ref 136–145)
Total Protein: 6.8 g/dL (ref 6.4–8.3)

## 2013-06-10 LAB — PROTIME-INR

## 2013-06-10 MED ORDER — SODIUM CHLORIDE 0.9 % IJ SOLN
10.0000 mL | INTRAMUSCULAR | Status: DC | PRN
  Administered 2013-06-10: 10 mL
  Filled 2013-06-10: qty 10

## 2013-06-10 MED ORDER — ACETAMINOPHEN 325 MG PO TABS
650.0000 mg | ORAL_TABLET | Freq: Once | ORAL | Status: AC
  Administered 2013-06-10: 650 mg via ORAL

## 2013-06-10 MED ORDER — SODIUM CHLORIDE 0.9 % IV SOLN
Freq: Once | INTRAVENOUS | Status: AC
  Administered 2013-06-10: 15:00:00 via INTRAVENOUS

## 2013-06-10 MED ORDER — TRASTUZUMAB CHEMO INJECTION 440 MG
6.0000 mg/kg | Freq: Once | INTRAVENOUS | Status: AC
  Administered 2013-06-10: 378 mg via INTRAVENOUS
  Filled 2013-06-10: qty 18

## 2013-06-10 MED ORDER — HEPARIN SOD (PORK) LOCK FLUSH 100 UNIT/ML IV SOLN
500.0000 [IU] | Freq: Once | INTRAVENOUS | Status: AC | PRN
  Administered 2013-06-10: 500 [IU]
  Filled 2013-06-10: qty 5

## 2013-06-10 MED ORDER — DIPHENHYDRAMINE HCL 25 MG PO CAPS: 50.0000 mg | ORAL_CAPSULE | Freq: Once | ORAL | Status: DC

## 2013-06-10 NOTE — Patient Instructions (Signed)
[facility] Discharge Instructions for Patients Receiving Chemotherapy  Today you received the following chemotherapy agents Herceptin.  To help prevent nausea and vomiting after your treatment, we encourage you to take your nausea medication as prescribed.   If you develop nausea and vomiting that is not controlled by your nausea medication, call the clinic.   BELOW ARE SYMPTOMS THAT SHOULD BE REPORTED IMMEDIATELY:  *FEVER GREATER THAN 100.5 F  *CHILLS WITH OR WITHOUT FEVER  NAUSEA AND VOMITING THAT IS NOT CONTROLLED WITH YOUR NAUSEA MEDICATION  *UNUSUAL SHORTNESS OF BREATH  *UNUSUAL BRUISING OR BLEEDING  TENDERNESS IN MOUTH AND THROAT WITH OR WITHOUT PRESENCE OF ULCERS  *URINARY PROBLEMS  *BOWEL PROBLEMS  UNUSUAL RASH Items with * indicate a potential emergency and should be followed up as soon as possible.  Feel free to call the clinic you have any questions or concerns. The clinic phone number is [phone].    

## 2013-06-10 NOTE — Progress Notes (Signed)
[facility]  [phone] [phone]  OFFICE PROGRESS NOTE   PATIENT: Jessica Wells   DOB: Aug 05, 1946  MR#: [id number]  JYN#:[id number]   QM:VHQION,GEXBMWUXL STEWART, MD Lurline Hare, MD Emelia Loron, MD Arvilla Meres, MD Charlaine Dalton. Sherene Sires, MD    DIAGNOSIS:  67 year-old [place], [place] woman with a recent diagnosis of inflammatory breast cancer.   PRIOR THERAPY: 1.  The patient was originally seen in clinic for new diagnosis of inflammatory breast cancer, she was referred by Dr. Emelia Loron. She had a mammogram performed on 07/14/2012 that showed an abnormality. That was biopsied and it showed an invasive mammary carcinoma with lymphovascular invasion grade 3 ER negative PR negative HER-2/neu positive.   2.  The patient had a MRI of the breasts performed on 07/19/2012.  The MRI showed diffuse right breast neoplasm on a large level I right axillary lymph nodes compatible with lymphatic spread.  The patient has had a biopsy of the right axillary lymph node that is compatible with invasive mammary carcinoma.   3.  The patient began neoadjuvant FEC 100 with day 2 Neulasta support on 07/23/2012.   4.   Weekly neoadjuvant Taxol and Herceptin started on 09/17/12.  The patient developed neuropathies, and Taxol was discontinued early.  She completed 7 weeks of Taxol/Herceptin combination therapy.  The patient received 2 weeks of Herceptin only.   5. Weekly Gemcitabine and Herceptin was started on 11/19/12 and completed on 12/17/2012 after 5 cycles.  6.  A regimen of Lasix 20 mg PO and Kdur 10 mEq was started on 01/07/2013 due to chronic lower extremity edema.  Medications are only to be taken if patient has a weight gain of 5 lbs or more in a day (daily weights).   7.  Anemia was treated with blood transfusions of 1 unit of PRBCs given on 01/11/2013 and another unit of PRBC's given 01/12/2013.  8. Patient underwent right mastectomy on  02/08/13.  Pathology showed a complete response to chemotherapy in the breast and 11 nodes identified with fibrosis but no carcinoma.    9.  Radiation therapy from 03/31/13 through 05/17/13.  10.  Every 3 week herceptin started 02/25/13.    CURRENT THERAPY:  Herceptin every 3 weeks  INTERVAL HISTORY: Ms. [name] is doing well today.  She is here for evaluation prior to her adjuvant Herceptin.  She is doing well.  She recently saw Dr. Dwain Sarna and has an upcoming appointment with Dr. Gala Romney.  She denies fevers, chills, nausea, vomiting, constipation, bleeding, any further swelling or redness, DOE, orthopnea, PND, or any further concerns.  A 10 point ROS is essentially negative.    PAST MEDICAL HISTORY: Past Medical History  Diagnosis Date  . GERD (gastroesophageal reflux disease)   . Dysrhythmia     PAT-sees dr Laurence Compton meds  . Breast cancer 07/14/12    inflammatory right breast ca, ER/PR -  . Allergy   . PAT (paroxysmal atrial tachycardia)     hx  . Arthritis     osteopenia,knees  . PONV (postoperative nausea and vomiting) 09-13-12    severe, with Port-a-cath, was managed without PONV  . Clotting disorder     prothrombin gene mutation-heterozygous   . History of chemotherapy     Last dose to be 02-04-13.  Was rx'd with Herceptin & Gemzar  . DVT (deep venous thrombosis)   . Asthma     Uses Inhalers Proventil as needed  . Bursitis 01-25-13    Rt. shoulder-  mildly affected now.  . History of blood transfusion 01-25-13    2 units 01-11-13  . Pneumonia 01-25-13    hx. 12-27-12-hospital stay x 9 days, now resolved.    PAST SURGICAL HISTORY: Past Surgical History  Procedure Laterality Date  . Dilation and curettage of uterus      x3  . Tonsillectomy    . Colonoscopy    . Portacath placement  07/21/2012    Procedure: INSERTION PORT-A-CATH;  Surgeon: Emelia Loron, MD;  Location: [facility];  Service: General;  Laterality: Left;/ Replacement done 10'13  . Insertion  of vena cava filter  12/2012  . Giant cell tumor  01-25-13    removed Rt. forearm.  . Eye lid surgery      right( MD office)  . Mastectomy modified radical Right 02/08/2013    Procedure: MASTECTOMY MODIFIED RADICAL;  Surgeon: Emelia Loron, MD;  Location: [facility];  Service: General;  Laterality: Right;  . Breast surgery      FAMILY HISTORY: Family History  Problem Relation Age of Onset  . Diabetes Mother   . Heart disease Mother   . Hypertension Mother   . Hyperlipidemia Mother   . Breast cancer Mother 26    lobular breast cancer  . Heart disease Maternal Aunt   . Heart disease Maternal Uncle   . Heart disease Maternal Grandmother     died in her 66s from heart disease  . Lung cancer Father 5    adenocarcinoma  . Cancer Paternal Uncle     dx in mid 76s with a cancer in the leg, died late 44s; grandpaternal half uncle    SOCIAL HISTORY: History  Substance Use Topics  . Smoking status: Former Smoker -- 1.00 packs/day for 35 years    Types: Cigarettes    Quit date: 07/21/1979  . Smokeless tobacco: Never Used  . Alcohol Use: No    ALLERGIES: Allergies  Allergen Reactions  . Anesthetics, Amide Nausea And Vomiting    Projectile vomitting-Nausea- with 24hrs of dry heaves. With any anesthetics  . Bactrim (Sulfamethoxazole W-Trimethoprim) Other (See Comments)    Massive diarrhea, nausea vomiting, platelets dropped, hemoglobin dropped, admitted to hospital  . Sulfa Antibiotics Nausea And Vomiting and Other (See Comments)    Crazy feeling in head Life threatening reaction, decreased blood counts  . Codeine Nausea And Vomiting  . Codeine Phosphate Nausea And Vomiting  . Eggs Or Egg-Derived Products Hives     MEDICATIONS:  Current Outpatient Prescriptions  Medication Sig Dispense Refill  . b complex vitamins tablet Take 1 tablet by mouth daily.      . calcium-vitamin D (OSCAL WITH D) 500-200 MG-UNIT per tablet Take 1 tablet by mouth every other day.       . fish  oil-omega-3 fatty acids 1000 MG capsule Take 2 g by mouth daily.      . Flaxseed, Linseed, (FLAX SEEDS PO) Take 1 tablet by mouth daily.      Marland Kitchen glucosamine-chondroitin 500-400 MG tablet Take 1 tablet by mouth daily.      . hyaluronate sodium (RADIAPLEXRX) GEL Apply topically 2 (two) times daily.      Marland Kitchen lidocaine-prilocaine (EMLA) cream Apply topically as needed.  30 g  5  . metoprolol succinate (TOPROL-XL) 50 MG 24 hr tablet Take 50 mg by mouth daily before breakfast. Take with or immediately following a meal.      . Multiple Vitamin (MULTIVITAMIN WITH MINERALS) TABS Take 1 tablet by mouth every  other day.       . non-metallic deodorant Thornton Papas) MISC Apply 1 application topically daily as needed.      Marland Kitchen omeprazole (PRILOSEC) 20 MG capsule Take 20 mg by mouth daily. For heartburn      . ondansetron (ZOFRAN) 8 MG tablet Take 8 mg by mouth every 8 (eight) hours as needed for nausea.       Marland Kitchen PROVENTIL HFA 108 (90 BASE) MCG/ACT inhaler Inhale 2 puffs into the lungs every 4 (four) hours as needed.       . warfarin (COUMADIN) 5 MG tablet Take one 5 mg tablet by mouth on Tuesday and Friday and 7.5 mg all other days or as directed by physician.  60 tablet  1   No current facility-administered medications for this visit.      REVIEW OF SYSTEMS: A 10 point review of systems was completed and is negative except as noted above.    PHYSICAL EXAMINATION: BP 132/70  Temp(Src) 98.4 F (36.9 C) (Oral)  Ht 5\' 1"  (1.549 m)  Wt 142 lb 11.2 oz (64.728 kg)  BMI 26.98 kg/m2     General appearance: Alert, cooperative, well nourished, no apparent distress Head:  Atraumatic, normocephalic Eyes: Conjunctivae/corneas clear, PERRLA, EOMI Nose: No drainage or sinus tenderness Neck: No adenopathy, supple, symmetrical, trachea midline, thyroid not enlarged, no tenderness Resp: Clear to auscultation bilaterally, diminished bibasilar breath sounds Cardio: Regular rate and rhythm, S1, S2 normal, no murmur, click, rub  or gallop, left chest Port-A-Cath covered with EMLA cream Breasts: right mastectomy site with erythema and moist desquamation, left breast no masses or nodules GI: Soft, distended, non-tender, hypoactive bowel sounds, no organomegaly Extremities: Extremities normal, atraumatic, no cyanosis, LE edema, LLE (+1)  is larger than RLE (trace edema), mild swelling in left upper extremity in forearm.   Lymph nodes: Cervical, supraclavicular, and axillary nodes normal Neurologic: Grossly normal  ECOG FS:  Grade 2 - Symptomatic, but completely ambulatory  LAB RESULTS: Lab Results  Component Value Date   WBC 5.5 06/10/2013   NEUTROABS 4.0 06/10/2013   HGB 10.6* 06/10/2013   HCT 30.4* 06/10/2013   MCV 86.1 06/10/2013   PLT 182 06/10/2013      Chemistry      Component Value Date/Time   NA 139 06/10/2013 1412   NA 140 02/10/2013 0400   K 3.9 06/10/2013 1412   K 3.4* 02/10/2013 0400   CL 105 05/20/2013 1459   CL 106 02/10/2013 0400   CO2 24 06/10/2013 1412   CO2 26 02/10/2013 0400   BUN 23.7 06/10/2013 1412   BUN 11 02/10/2013 0400   CREATININE 1.1 06/10/2013 1412   CREATININE 0.84 02/10/2013 0400      Component Value Date/Time   CALCIUM 9.3 06/10/2013 1412   CALCIUM 8.7 02/10/2013 0400   ALKPHOS 73 06/10/2013 1412   ALKPHOS 66 01/28/2013 1630   AST 25 06/10/2013 1412   AST 36 01/28/2013 1630   ALT 26 06/10/2013 1412   ALT 55* 01/28/2013 1630   BILITOT 0.26 06/10/2013 1412   BILITOT 0.2* 01/28/2013 1630      Lab Results  Component Value Date   LABCA2 16 07/19/2012     RADIOGRAPHIC STUDIES: No results found.  ASSESSMENT: 67 y.o. [place], [place] woman with:  1.  Inflammatory right breast cancer with positive lymph nodes the tumor is ER negative PR negative HER-2/neu positive.  Patient is s/p neoadjuvant chemotherapy consisting of FEC q 2 weeks x 4 cycles.  Status post  weekly Gemcitabine and Herceptin that was started on 11/19/12 and completed on 12/17/2012 after 5 cycles.   Herceptin Qweekly x 12 weeks  started on 01/14/2013 and she will receive a Herceptin infusion today.  Patient underwent right mastectomy by Dr. Dwain Sarna on 02/08/13 with complete pathologic response to neoadjuvant chemotherapy. Adjuvant Herceptin every 3 weeks started on 02/25/13, and radiation therapy with Dr. Michell Heinrich began on 03/31/13.   2.  Bilateral lower extremity DVT and Bilateral PE.  The patient has a positive Prothrombin Gene Mutation, heterozygous. S/p IVC filter inserted on  12/27/2012. She will continue on Coumadin and take 7.5 mg three nights per week and 5mg  4 nights per week.   3.  Lower extremity edema-near resolved today.   PLAN:  1.  Bilateral DVT and PE.  INR is stable.  She will continue her current dose.    2. Patient will proceed with Herceptin today.   3. Ms. Tamer will return in 3 weeks for her next cycle of Herceptin.  She will see Dr. Gala Romney on 06/20/13.          All questions were answered.  The patient was encouraged to contact us in the interim with any problems, questions or concerns.   I spent 25 minutes counseling the patient face to face.  The total time spent in the appointment was 30 minutes.  Cherie Ouch Lyn Hollingshead, NP Medical Oncology [facility] Phone: [phone] 06/11/2013, 10:12 AM

## 2013-06-14 ENCOUNTER — Encounter: Payer: Self-pay | Admitting: Radiation Oncology

## 2013-06-15 ENCOUNTER — Encounter: Payer: Self-pay | Admitting: Radiation Oncology

## 2013-06-15 ENCOUNTER — Ambulatory Visit
Admission: RE | Admit: 2013-06-15 | Discharge: 2013-06-15 | Disposition: A | Discharge status: Home / Self Care | Payer: Medicare Other | Source: Ambulatory Visit | Attending: Radiation Oncology | Admitting: Radiation Oncology

## 2013-06-15 VITALS — BP 121/72 | HR 75 | Temp 98.6°F | Resp 20 | Wt 142.7 lb

## 2013-06-15 DIAGNOSIS — C50911 Malignant neoplasm of unspecified site of right female breast: Secondary | ICD-10-CM

## 2013-06-15 HISTORY — DX: Personal history of irradiation: Z92.3

## 2013-06-15 NOTE — Progress Notes (Signed)
Follow up s/p rad tx s right breast, 04/01/15-05/17/13, rcw has healed well, slight tanning, no pain there, pain bursitis in shoulder, walks 20 min daily, uses regular cream lotion , gave [facility] flyer to patient after asking if she ever decided to go ., she wis interested in going to next session 1:41 PM

## 2013-06-15 NOTE — Progress Notes (Signed)
Department of Radiation Oncology  Phone:  [phone] Fax:        [phone]   Name: Jessica Wells MRN: [id number]  DOB: 01/22/1946  Date: 06/15/2013  Follow Up Visit Note  Diagnosis: Inflammatory right breast cancer  Summary and Interval since last radiation: 60.4 gray completed 05/17/2013  Interval History: Ashiah presents today for routine followup.  She is healed up well. She is going to get a prosthesis today. She is processing her diagnosis but more. We discussed again her options for prophylactic left mastectomy versus her greater risk which is systemic failure. She is continuing Herceptin. She is interested in getting involved in a community projects.  Allergies:  Allergies  Allergen Reactions  . Anesthetics, Amide Nausea And Vomiting    Projectile vomitting-Nausea- with 24hrs of dry heaves. With any anesthetics  . Bactrim (Sulfamethoxazole W-Trimethoprim) Other (See Comments)    Massive diarrhea, nausea vomiting, platelets dropped, hemoglobin dropped, admitted to hospital  . Sulfa Antibiotics Nausea And Vomiting and Other (See Comments)    Crazy feeling in head Life threatening reaction, decreased blood counts  . Codeine Nausea And Vomiting  . Codeine Phosphate Nausea And Vomiting  . Eggs Or Egg-Derived Products Hives    Medications:  Current Outpatient Prescriptions  Medication Sig Dispense Refill  . b complex vitamins tablet Take 1 tablet by mouth daily.      . calcium-vitamin D (OSCAL WITH D) 500-200 MG-UNIT per tablet Take 1 tablet by mouth every other day.       . fish oil-omega-3 fatty acids 1000 MG capsule Take 2 g by mouth daily.      . Flaxseed, Linseed, (FLAX SEEDS PO) Take 1 tablet by mouth daily.      Marland Kitchen glucosamine-chondroitin 500-400 MG tablet Take 1 tablet by mouth daily.      Marland Kitchen lidocaine-prilocaine (EMLA) cream Apply topically as needed.  30 g  5  . metoprolol succinate (TOPROL-XL) 50 MG 24 hr tablet Take 50 mg by mouth daily before breakfast. Take  with or immediately following a meal.      . Multiple Vitamin (MULTIVITAMIN WITH MINERALS) TABS Take 1 tablet by mouth every other day.       Marland Kitchen omeprazole (PRILOSEC) 20 MG capsule Take 20 mg by mouth daily. For heartburn      . ondansetron (ZOFRAN) 8 MG tablet Take 8 mg by mouth every 8 (eight) hours as needed for nausea.       Marland Kitchen PROVENTIL HFA 108 (90 BASE) MCG/ACT inhaler Inhale 2 puffs into the lungs every 4 (four) hours as needed.       . warfarin (COUMADIN) 5 MG tablet Take one 5 mg tablet by mouth on Tuesday and Friday and 7.5 mg all other days or as directed by physician.  60 tablet  1  . hyaluronate sodium (RADIAPLEXRX) GEL Apply topically 2 (two) times daily.      . non-metallic deodorant Thornton Papas) MISC Apply 1 application topically daily as needed.       No current facility-administered medications for this encounter.    Physical Exam:  Filed Vitals:   06/15/13 1332  BP: 121/72  Pulse: 75  Temp: 98.6 F (37 C)  Resp: 20   her right chest wall is well-healed. There is some very faint hyperpigmentation especially inferiorly and towards her axilla. No visible or palpable evidence of tumor recurrence  IMPRESSION: Tyeasha is a 67 y.o. female status post neoadjuvant chemotherapy, mastectomy and adjuvant radiation with resolving acute effects of treatment  PLAN:  She will he looks well. She is not interested in reconstruction at this time but may consider a reduction or mastectomy on the left side. We discussed survivorship issues including finding a early detection group. We also discussed our survivorship program which she is interested in doing. She is exercising and continues on Herceptin. I discussed continuing to use some protection in the treated area. I will see her back on an as-needed basis. She knows she can always call with any questions.    Lurline Hare, MD

## 2013-06-16 ENCOUNTER — Ambulatory Visit: Payer: Medicare Other | Admitting: Radiation Oncology

## 2013-06-20 ENCOUNTER — Ambulatory Visit (HOSPITAL_BASED_OUTPATIENT_CLINIC_OR_DEPARTMENT_OTHER)
Admission: RE | Admit: 2013-06-20 | Discharge: 2013-06-20 | Disposition: A | Discharge status: Home / Self Care | Payer: Medicare Other | Source: Ambulatory Visit | Attending: Cardiology | Admitting: Cardiology

## 2013-06-20 ENCOUNTER — Ambulatory Visit (HOSPITAL_COMMUNITY)
Admission: RE | Admit: 2013-06-20 | Discharge: 2013-06-20 | Disposition: A | Discharge status: Home / Self Care | Payer: Medicare Other | Source: Ambulatory Visit | Attending: Internal Medicine | Admitting: Internal Medicine

## 2013-06-20 VITALS — BP 120/70 | HR 67 | Wt 145.0 lb

## 2013-06-20 DIAGNOSIS — I498 Other specified cardiac arrhythmias: Secondary | ICD-10-CM | POA: Insufficient documentation

## 2013-06-20 DIAGNOSIS — I2699 Other pulmonary embolism without acute cor pulmonale: Secondary | ICD-10-CM

## 2013-06-20 DIAGNOSIS — Z79899 Other long term (current) drug therapy: Secondary | ICD-10-CM | POA: Diagnosis not present

## 2013-06-20 DIAGNOSIS — Z923 Personal history of irradiation: Secondary | ICD-10-CM | POA: Diagnosis not present

## 2013-06-20 DIAGNOSIS — C50119 Malignant neoplasm of central portion of unspecified female breast: Secondary | ICD-10-CM

## 2013-06-20 DIAGNOSIS — J45909 Unspecified asthma, uncomplicated: Secondary | ICD-10-CM | POA: Insufficient documentation

## 2013-06-20 DIAGNOSIS — Z9221 Personal history of antineoplastic chemotherapy: Secondary | ICD-10-CM

## 2013-06-20 DIAGNOSIS — I059 Rheumatic mitral valve disease, unspecified: Secondary | ICD-10-CM | POA: Diagnosis not present

## 2013-06-20 DIAGNOSIS — Z7901 Long term (current) use of anticoagulants: Secondary | ICD-10-CM | POA: Insufficient documentation

## 2013-06-20 DIAGNOSIS — C50919 Malignant neoplasm of unspecified site of unspecified female breast: Secondary | ICD-10-CM | POA: Insufficient documentation

## 2013-06-20 DIAGNOSIS — Z86718 Personal history of other venous thrombosis and embolism: Secondary | ICD-10-CM | POA: Diagnosis not present

## 2013-06-20 DIAGNOSIS — Z86711 Personal history of pulmonary embolism: Secondary | ICD-10-CM | POA: Diagnosis not present

## 2013-06-20 DIAGNOSIS — K219 Gastro-esophageal reflux disease without esophagitis: Secondary | ICD-10-CM | POA: Diagnosis not present

## 2013-06-20 DIAGNOSIS — Z09 Encounter for follow-up examination after completed treatment for conditions other than malignant neoplasm: Secondary | ICD-10-CM | POA: Diagnosis not present

## 2013-06-20 DIAGNOSIS — C50111 Malignant neoplasm of central portion of right female breast: Secondary | ICD-10-CM

## 2013-06-20 DIAGNOSIS — Z171 Estrogen receptor negative status [ER-]: Secondary | ICD-10-CM | POA: Diagnosis not present

## 2013-06-20 NOTE — Progress Notes (Signed)
Patient ID: Jessica Wells, female   DOB: 1946/05/31, 67 y.o.   MRN: [id number] Oncologist: Dr Welton Flakes-  General Surgeon: Dr Dwain Sarna  HPI: 67 year old female with a history of tachycardia, inflammatory right breast cancer with positive lymph nodes the tumor is ER negative PR negative HER-2/neu positive. She has completed FEC for 4 cycles then taxol and herceptin weekly for 12 weeks.  She continues on herceptin q3 weeks.  S/p right radical mastectomy 02/08/2013.    Admitted to [facility] 12/27/12 with increased dyspnea. Respiratory Failure. Bilateral pulmonary embolism and pulmonary infiltrates. Bilateral lower extremity DVT.  [place] filter placed while hospitalized. Discharged from [facility] 01/05/13.  ECHOS:  07/22/12 EF 60-65% lateral s' 10.5 10/2012 EF  60-65% lateral s' 10.7  01/19/13 EF 55-60% lateral S' 13.0 No evidence of RV strain.  03/21/13 EF 55-60% lateral s' 9.8 (remeasured by me today) Global Strain -16 06/20/13 EF 55-60% Lateral S'  9.3 Global Strain -24  She returns for follow up today.  Denies SOB/PND/Orthopnea. Getting herceptin every 3 weeks until November 2014. Over the last couple of months she has had 2 episodes of tachycardia that lasted minutes not hours.  Dr Welton Flakes managing coumadin. Able to exercise daily.     Past Medical History  Diagnosis Date  . GERD (gastroesophageal reflux disease)   . Dysrhythmia     PAT-sees dr Laurence Compton meds  . Breast cancer 07/14/12    inflammatory right breast ca, ER/PR -  . Allergy   . PAT (paroxysmal atrial tachycardia)     hx  . Arthritis     osteopenia,knees  . PONV (postoperative nausea and vomiting) 09-13-12    severe, with Port-a-cath, was managed without PONV  . Clotting disorder     prothrombin gene mutation-heterozygous   . History of chemotherapy     Last dose to be 02-04-13.  Was rx'd with Herceptin & Gemzar  . DVT (deep venous thrombosis)   . Asthma     Uses Inhalers Proventil as needed  . Bursitis 01-25-13    Rt. shoulder- mildly affected  now.  . History of blood transfusion 01-25-13    2 units 01-11-13  . Pneumonia 01-25-13    hx. 12-27-12-hospital stay x 9 days, now resolved.  . S/P radiation therapy 4-12 wks ago 03/31/13-05/17/13    right breast/supraclavicular fossa/posterior axillary boost    Current Outpatient Prescriptions  Medication Sig Dispense Refill  . b complex vitamins tablet Take 1 tablet by mouth daily.      . calcium-vitamin D (OSCAL WITH D) 500-200 MG-UNIT per tablet Take 1 tablet by mouth every other day.       . fish oil-omega-3 fatty acids 1000 MG capsule Take 2 g by mouth daily.      . Flaxseed, Linseed, (FLAX SEEDS PO) Take 1 tablet by mouth daily.      Marland Kitchen glucosamine-chondroitin 500-400 MG tablet Take 1 tablet by mouth daily.      . hyaluronate sodium (RADIAPLEXRX) GEL Apply topically 2 (two) times daily.      Marland Kitchen lidocaine-prilocaine (EMLA) cream Apply topically as needed.  30 g  5  . metoprolol succinate (TOPROL-XL) 50 MG 24 hr tablet Take 50 mg by mouth daily before breakfast. Take with or immediately following a meal.      . Multiple Vitamin (MULTIVITAMIN WITH MINERALS) TABS Take 1 tablet by mouth every other day.       . non-metallic deodorant Thornton Papas) MISC Apply 1 application topically daily as needed.      Marland Kitchen  omeprazole (PRILOSEC) 20 MG capsule Take 20 mg by mouth daily. For heartburn      . ondansetron (ZOFRAN) 8 MG tablet Take 8 mg by mouth every 8 (eight) hours as needed for nausea.       Marland Kitchen PROVENTIL HFA 108 (90 BASE) MCG/ACT inhaler Inhale 2 puffs into the lungs every 4 (four) hours as needed.       . warfarin (COUMADIN) 5 MG tablet Take one 5 mg tablet by mouth on Tuesday and Friday and 7.5 mg all other days or as directed by physician.  60 tablet  1   No current facility-administered medications for this encounter.     Allergies  Allergen Reactions  . Anesthetics, Amide Nausea And Vomiting    Projectile vomitting-Nausea- with 24hrs of dry heaves. With any anesthetics  . Bactrim  (Sulfamethoxazole W-Trimethoprim) Other (See Comments)    Massive diarrhea, nausea vomiting, platelets dropped, hemoglobin dropped, admitted to hospital  . Sulfa Antibiotics Nausea And Vomiting and Other (See Comments)    Crazy feeling in head Life threatening reaction, decreased blood counts  . Codeine Nausea And Vomiting  . Codeine Phosphate Nausea And Vomiting  . Eggs Or Egg-Derived Products Hives    History   Social History  . Marital Status: Single    Spouse Name: N/A    Number of Children: 1  . Years of Education: N/A   Occupational History  .      retired Hydrographic surveyor   Social History Main Topics  . Smoking status: Former Smoker -- 1.00 packs/day for 35 years    Types: Cigarettes    Quit date: 07/21/1979  . Smokeless tobacco: Never Used  . Alcohol Use: No  . Drug Use: No  . Sexually Active: Not Currently     Comment: menarche age 67, P 1, menopause age 43, HRT x 3-5 yrs, low dosage   Other Topics Concern  . Not on file   Social History Narrative  . No narrative on file    Family History  Problem Relation Age of Onset  . Diabetes Mother   . Heart disease Mother   . Hypertension Mother   . Hyperlipidemia Mother   . Breast cancer Mother 110    lobular breast cancer  . Heart disease Maternal Aunt   . Heart disease Maternal Uncle   . Heart disease Maternal Grandmother     died in her 10s from heart disease  . Lung cancer Father 48    adenocarcinoma  . Cancer Paternal Uncle     dx in mid 6s with a cancer in the leg, died late 85s; grandpaternal half uncle  - PE: Has IVC filter  PHYSICAL EXAM: Filed Vitals:   06/20/13 1454  BP: 120/70  Pulse: 67  Weight: 145 lb (65.772 kg)  SpO2: 98%    General:  Well appearing. No respiratory difficulty HEENT: no Neck: supple. no JVD. Carotids 2+ bilat; no bruits. No lymphadenopathy or thryomegaly appreciated. Cor: PMI nondisplaced. Regular rate & rhythm. No rubs, gallops or murmurs. Lungs: clear Abdomen: soft,  nontender, nondistended. No hepatosplenomegaly. No bruits or masses. Good bowel sounds. Extremities: no cyanosis, clubbing, rash, tr lower extremity edema.   Neuro: alert & oriented x 3, cranial nerves grossly intact. moves all 4 extremities w/o difficulty. Affect pleasant.  ASSESSMENT & PLAN: 1. Breast cancer: Patient remains on Herceptin, which will finish in 11/14.  I reviewed her echo today.  Lateral s' was 9.3 cm/sec.  I remeasured prior echo lateral s'  and found it to be 9.8 cm/sec.  Change is not significant.  Additionally,  Global longitudinal strain was actually more negative on today's study.  She will followup in 3 months with repeat echo.  2. SVT: rare symptomatic episodes lasting a few minutes.  She will continue Toprol XL.  If symptoms become more frequent, we could increase Toprol or send her for an ablation.  3. History of PE: On coumadin, has IVC filter.   Marca Ancona 06/21/2013

## 2013-06-20 NOTE — Patient Instructions (Addendum)
Follow up in 3 months with an ECHO and Dr Leory Plowman

## 2013-06-20 NOTE — Progress Notes (Signed)
Echo Lab  2D Echocardiogram completed.  Lottie Sigman L Joeph Szatkowski, RDCS 06/20/2013 2:27 PM

## 2013-06-24 ENCOUNTER — Telehealth: Payer: Self-pay | Admitting: Oncology

## 2013-06-24 NOTE — Telephone Encounter (Signed)
Faxed pt medical records to  [facility]

## 2013-06-27 ENCOUNTER — Telehealth (INDEPENDENT_AMBULATORY_CARE_PROVIDER_SITE_OTHER): Payer: Self-pay

## 2013-06-27 NOTE — Telephone Encounter (Signed)
Patient is asking for 2nd prosthesis; [facility] is asking for verbal order

## 2013-06-27 NOTE — Telephone Encounter (Signed)
Called pt to ask if she would like for me to fax the script to [facility] and she would like for me to do so. I faxed the script to [phone]#[phone].

## 2013-07-01 ENCOUNTER — Ambulatory Visit (HOSPITAL_BASED_OUTPATIENT_CLINIC_OR_DEPARTMENT_OTHER): Payer: Medicare Other

## 2013-07-01 ENCOUNTER — Ambulatory Visit (HOSPITAL_BASED_OUTPATIENT_CLINIC_OR_DEPARTMENT_OTHER): Payer: Medicare Other | Admitting: Oncology

## 2013-07-01 ENCOUNTER — Encounter: Payer: Self-pay | Admitting: Oncology

## 2013-07-01 ENCOUNTER — Other Ambulatory Visit (HOSPITAL_BASED_OUTPATIENT_CLINIC_OR_DEPARTMENT_OTHER): Payer: Medicare Other | Admitting: Lab

## 2013-07-01 VITALS — BP 149/80 | HR 74 | Temp 97.0°F | Resp 20 | Ht 61.0 in | Wt 143.5 lb

## 2013-07-01 DIAGNOSIS — C50919 Malignant neoplasm of unspecified site of unspecified female breast: Secondary | ICD-10-CM

## 2013-07-01 DIAGNOSIS — Z171 Estrogen receptor negative status [ER-]: Secondary | ICD-10-CM

## 2013-07-01 DIAGNOSIS — C773 Secondary and unspecified malignant neoplasm of axilla and upper limb lymph nodes: Secondary | ICD-10-CM | POA: Diagnosis not present

## 2013-07-01 DIAGNOSIS — C50112 Malignant neoplasm of central portion of left female breast: Secondary | ICD-10-CM

## 2013-07-01 DIAGNOSIS — I82403 Acute embolism and thrombosis of unspecified deep veins of lower extremity, bilateral: Secondary | ICD-10-CM

## 2013-07-01 DIAGNOSIS — D649 Anemia, unspecified: Secondary | ICD-10-CM

## 2013-07-01 DIAGNOSIS — Z5112 Encounter for antineoplastic immunotherapy: Secondary | ICD-10-CM | POA: Diagnosis not present

## 2013-07-01 DIAGNOSIS — I82409 Acute embolism and thrombosis of unspecified deep veins of unspecified lower extremity: Secondary | ICD-10-CM

## 2013-07-01 DIAGNOSIS — D539 Nutritional anemia, unspecified: Secondary | ICD-10-CM

## 2013-07-01 DIAGNOSIS — I2699 Other pulmonary embolism without acute cor pulmonale: Secondary | ICD-10-CM | POA: Diagnosis not present

## 2013-07-01 DIAGNOSIS — C50111 Malignant neoplasm of central portion of right female breast: Secondary | ICD-10-CM

## 2013-07-01 DIAGNOSIS — D801 Nonfamilial hypogammaglobulinemia: Secondary | ICD-10-CM

## 2013-07-01 LAB — COMPREHENSIVE METABOLIC PANEL (CC13)
ALT: 25 U/L (ref 0–55)
CO2: 24 mEq/L (ref 22–29)
Calcium: 9.2 mg/dL (ref 8.4–10.4)
Chloride: 107 mEq/L (ref 98–109)
Creatinine: 1 mg/dL (ref 0.6–1.1)
Total Protein: 7 g/dL (ref 6.4–8.3)

## 2013-07-01 LAB — CBC WITH DIFFERENTIAL/PLATELET
BASO%: 0.8 % (ref 0.0–2.0)
Eosinophils Absolute: 0.2 10*3/uL (ref 0.0–0.5)
HCT: 33.1 % — ABNORMAL LOW (ref 34.8–46.6)
HGB: 11.3 g/dL — ABNORMAL LOW (ref 11.6–15.9)
MCHC: 34.1 g/dL (ref 31.5–36.0)
MONO#: 0.5 10*3/uL (ref 0.1–0.9)
NEUT#: 4.3 10*3/uL (ref 1.5–6.5)
NEUT%: 68.3 % (ref 38.4–76.8)
WBC: 6.2 10*3/uL (ref 3.9–10.3)
lymph#: 1.3 10*3/uL (ref 0.9–3.3)

## 2013-07-01 LAB — PROTIME-INR

## 2013-07-01 MED ORDER — SODIUM CHLORIDE 0.9 % IV SOLN
Freq: Once | INTRAVENOUS | Status: AC
  Administered 2013-07-01: 14:00:00 via INTRAVENOUS

## 2013-07-01 MED ORDER — ACETAMINOPHEN 325 MG PO TABS
650.0000 mg | ORAL_TABLET | Freq: Once | ORAL | Status: AC
  Administered 2013-07-01: 650 mg via ORAL

## 2013-07-01 MED ORDER — HEPARIN SOD (PORK) LOCK FLUSH 100 UNIT/ML IV SOLN
500.0000 [IU] | Freq: Once | INTRAVENOUS | Status: AC | PRN
  Administered 2013-07-01: 500 [IU]
  Filled 2013-07-01: qty 5

## 2013-07-01 MED ORDER — SODIUM CHLORIDE 0.9 % IJ SOLN
10.0000 mL | INTRAMUSCULAR | Status: DC | PRN
  Administered 2013-07-01: 10 mL
  Filled 2013-07-01: qty 10

## 2013-07-01 MED ORDER — TRASTUZUMAB CHEMO INJECTION 440 MG
6.0000 mg/kg | Freq: Once | INTRAVENOUS | Status: AC
  Administered 2013-07-01: 378 mg via INTRAVENOUS
  Filled 2013-07-01: qty 18

## 2013-07-01 NOTE — Patient Instructions (Addendum)
[facility] Discharge Instructions for Patients Receiving Chemotherapy  Today you received the following chemotherapy agents Herceptin  To help prevent nausea and vomiting after your treatment, we encourage you to take your nausea medication     If you develop nausea and vomiting that is not controlled by your nausea medication, call the clinic.   BELOW ARE SYMPTOMS THAT SHOULD BE REPORTED IMMEDIATELY:  *FEVER GREATER THAN 100.5 F  *CHILLS WITH OR WITHOUT FEVER  NAUSEA AND VOMITING THAT IS NOT CONTROLLED WITH YOUR NAUSEA MEDICATION  *UNUSUAL SHORTNESS OF BREATH  *UNUSUAL BRUISING OR BLEEDING  TENDERNESS IN MOUTH AND THROAT WITH OR WITHOUT PRESENCE OF ULCERS  *URINARY PROBLEMS  *BOWEL PROBLEMS  UNUSUAL RASH Items with * indicate a potential emergency and should be followed up as soon as possible.  Feel free to call the clinic you have any questions or concerns. The clinic phone number is [phone].    

## 2013-07-01 NOTE — Patient Instructions (Signed)
Proceed with herceptin today  Return in 3 weeks for follow up and Herceptin

## 2013-07-03 IMAGING — CR DG CHEST 2V
2 series · 2 of 2 positions shown · non-contrast
Comparison: Portable examination 01/04/2013.  Two-view examination
12/27/2012.

CLINICAL DATA: Shortness of breath.  Follow up pneumonia.

CHEST - 2 VIEW

[view not recorded (1 of 2)]
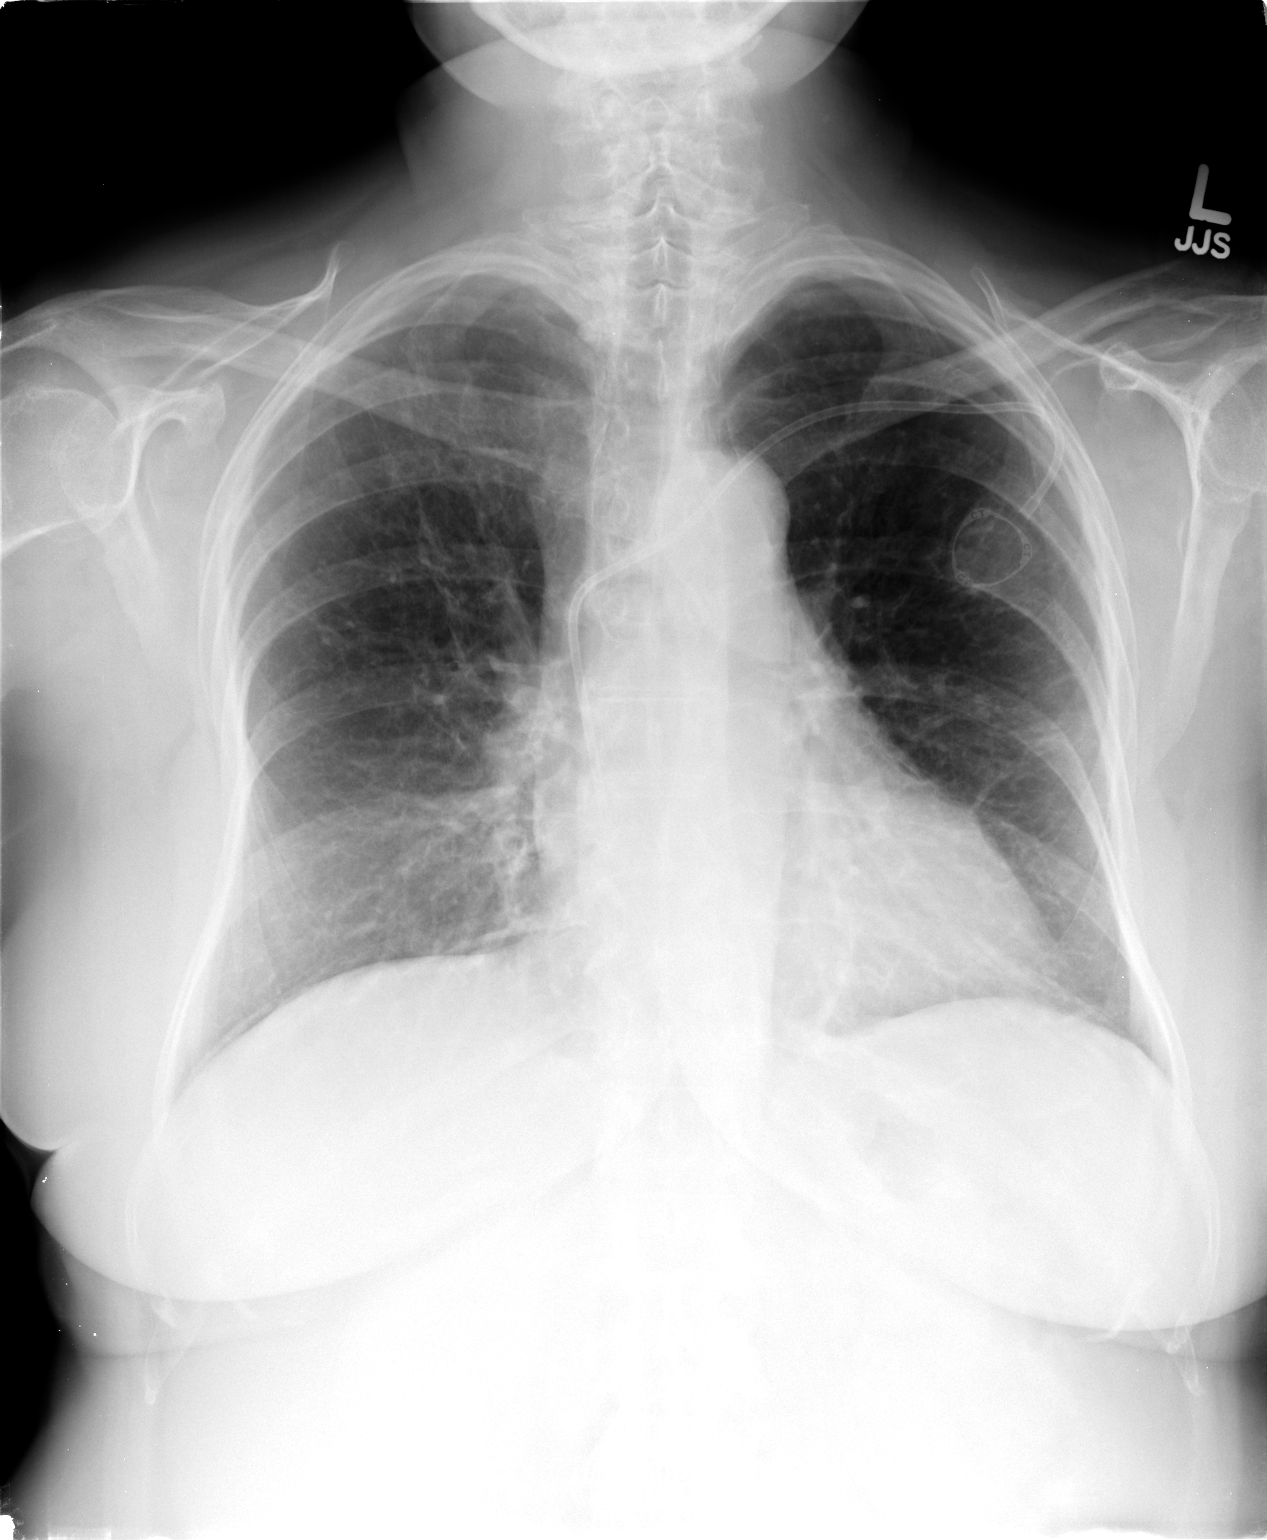

[view not recorded (2 of 2)]
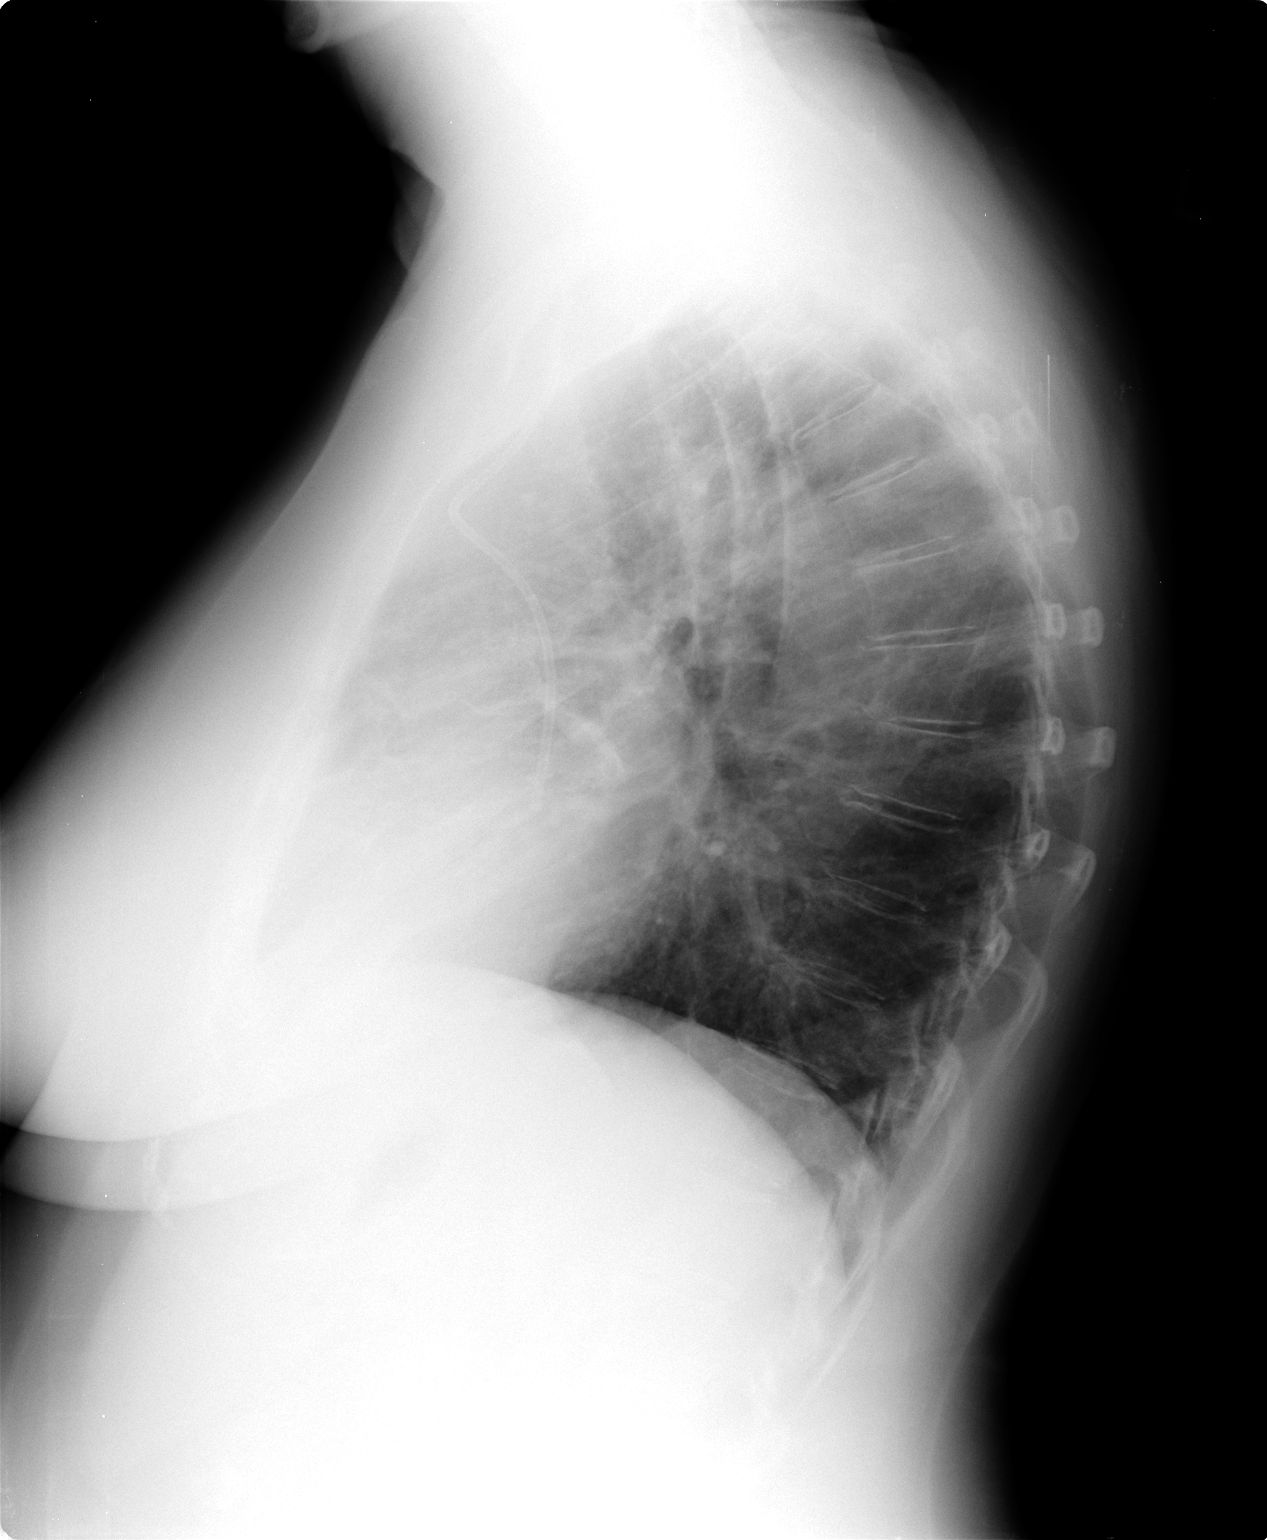

[2 of 2 positions shown; findings below may reference images not displayed]

FINDINGS: There has been near-complete resolution of the bilateral
air space opacities.  Mild residual scarring is present
bilaterally.  There is no pleural effusion.  Heart size and
mediastinal contours are stable.  Left subclavian Port-A-Cath is
unchanged near the SVC right atrial junction.
IMPRESSION: Interval near complete resolution of bilateral pneumonia.  No acute
findings identified.

## 2013-07-04 ENCOUNTER — Telehealth: Payer: Self-pay | Admitting: *Deleted

## 2013-07-04 NOTE — Telephone Encounter (Signed)
Lm gv appts for 9/12, 10/3, and 10/24. Pt is aware that tx will follow after the appts. i emailed MB to add tx's. i also emailed kk for a slot time for 08/12/13. Pt is aware...td

## 2013-07-06 ENCOUNTER — Other Ambulatory Visit: Payer: Self-pay

## 2013-07-06 ENCOUNTER — Telehealth: Payer: Self-pay | Admitting: *Deleted

## 2013-07-06 NOTE — Telephone Encounter (Signed)
sw pt gv appt for labs @ 1:45pm and ov @2 :15pm on 08/12/13. Pt is aware...td

## 2013-07-10 NOTE — Progress Notes (Signed)
[facility]  Telephone:(336) [phone] Fax:(336) [phone]  OFFICE PROGRESS NOTE   PATIENT: Jessica Wells   DOB: 02-01-1946  MR#: [id number]  JYN#:[id number]   QM:[id number],[id number] STEWART, MD Lurline Hare, MD Emelia Loron, MD Arvilla Meres, MD Charlaine Dalton. Sherene Sires, MD    DIAGNOSIS:  67 year-old [place], [place] woman with a recent diagnosis of inflammatory breast cancer.   PRIOR THERAPY: 1.  The patient was originally seen in clinic for new diagnosis of inflammatory breast cancer, she was referred by Dr. Emelia Loron. She had a mammogram performed on 07/14/2012 that showed an abnormality. That was biopsied and it showed an invasive mammary carcinoma with lymphovascular invasion grade 3 ER negative PR negative HER-2/neu positive.   2.  The patient had a MRI of the breasts performed on 07/19/2012.  The MRI showed diffuse right breast neoplasm on a large level I right axillary lymph nodes compatible with lymphatic spread.  The patient has had a biopsy of the right axillary lymph node that is compatible with invasive mammary carcinoma.   3.  The patient began neoadjuvant FEC 100 with day 2 Neulasta support on 07/23/2012.   4.   Weekly neoadjuvant Taxol and Herceptin started on 09/17/12.  The patient developed neuropathies, and Taxol was discontinued early.  She completed 7 weeks of Taxol/Herceptin combination therapy.  The patient received 2 weeks of Herceptin only.   5. Weekly Gemcitabine and Herceptin was started on 11/19/12 and completed on 12/17/2012 after 5 cycles.  6.  A regimen of Lasix 20 mg PO and Kdur 10 mEq was started on 01/07/2013 due to chronic lower extremity edema.  Medications are only to be taken if patient has a weight gain of 5 lbs or more in a day (daily weights).   7.  Anemia was treated with blood transfusions of 1 unit of PRBCs given on 01/11/2013 and another unit of PRBC's given 01/12/2013.  8. Patient underwent right mastectomy on  02/08/13.  Pathology showed a complete response to chemotherapy in the breast and 11 nodes identified with fibrosis but no carcinoma.    9.  Radiation therapy from 03/31/13 through 05/17/13.  10.  Every 3 week herceptin started 02/25/13.    CURRENT THERAPY:  Herceptin every 3 weeks  INTERVAL HISTORY: Ms. [name] is doing well today.  She is here for evaluation prior to her adjuvant Herceptin.  She is doing well.  She denies fevers, chills, nausea, vomiting, constipation, bleeding, any further swelling or redness, DOE, orthopnea, PND, or any further concerns.  A 10 point ROS is essentially negative.    PAST MEDICAL HISTORY: Past Medical History  Diagnosis Date  . GERD (gastroesophageal reflux disease)   . Dysrhythmia     PAT-sees dr Laurence Compton meds  . Breast cancer 07/14/12    inflammatory right breast ca, ER/PR -  . Allergy   . PAT (paroxysmal atrial tachycardia)     hx  . Arthritis     osteopenia,knees  . PONV (postoperative nausea and vomiting) 09-13-12    severe, with Port-a-cath, was managed without PONV  . Clotting disorder     prothrombin gene mutation-heterozygous   . History of chemotherapy     Last dose to be 02-04-13.  Was rx'd with Herceptin & Gemzar  . DVT (deep venous thrombosis)   . Asthma     Uses Inhalers Proventil as needed  . Bursitis 01-25-13    Rt. shoulder- mildly affected now.  . History of blood transfusion 01-25-13    2  units 01-11-13  . Pneumonia 01-25-13    hx. 12-27-12-hospital stay x 9 days, now resolved.  . S/P radiation therapy 4-12 wks ago 03/31/13-05/17/13    right breast/supraclavicular fossa/posterior axillary boost    PAST SURGICAL HISTORY: Past Surgical History  Procedure Laterality Date  . Dilation and curettage of uterus      x3  . Tonsillectomy    . Colonoscopy    . Portacath placement  07/21/2012    Procedure: INSERTION PORT-A-CATH;  Surgeon: Emelia Loron, MD;  Location: [facility];  Service: General;  Laterality: Left;/  Replacement done 10'13  . Insertion of vena cava filter  12/2012  . Giant cell tumor  01-25-13    removed Rt. forearm.  . Eye lid surgery      right( MD office)  . Mastectomy modified radical Right 02/08/2013    Procedure: MASTECTOMY MODIFIED RADICAL;  Surgeon: Emelia Loron, MD;  Location: [facility];  Service: General;  Laterality: Right;  . Breast surgery      FAMILY HISTORY: Family History  Problem Relation Age of Onset  . Diabetes Mother   . Heart disease Mother   . Hypertension Mother   . Hyperlipidemia Mother   . Breast cancer Mother 65    lobular breast cancer  . Heart disease Maternal Aunt   . Heart disease Maternal Uncle   . Heart disease Maternal Grandmother     died in her 57s from heart disease  . Lung cancer Father 78    adenocarcinoma  . Cancer Paternal Uncle     dx in mid 48s with a cancer in the leg, died late 46s; grandpaternal half uncle    SOCIAL HISTORY: History  Substance Use Topics  . Smoking status: Former Smoker -- 1.00 packs/day for 35 years    Types: Cigarettes    Quit date: 07/21/1979  . Smokeless tobacco: Never Used  . Alcohol Use: No    ALLERGIES: Allergies  Allergen Reactions  . Anesthetics, Amide Nausea And Vomiting    Projectile vomitting-Nausea- with 24hrs of dry heaves. With any anesthetics  . Bactrim (Sulfamethoxazole W-Trimethoprim) Other (See Comments)    Massive diarrhea, nausea vomiting, platelets dropped, hemoglobin dropped, admitted to hospital  . Sulfa Antibiotics Nausea And Vomiting and Other (See Comments)    Crazy feeling in head Life threatening reaction, decreased blood counts  . Codeine Nausea And Vomiting  . Codeine Phosphate Nausea And Vomiting  . Eggs Or Egg-Derived Products Hives     MEDICATIONS:  Current Outpatient Prescriptions  Medication Sig Dispense Refill  . b complex vitamins tablet Take 1 tablet by mouth daily.      . calcium-vitamin D (OSCAL WITH D) 500-200 MG-UNIT per tablet Take 1 tablet by mouth  every other day.       . fish oil-omega-3 fatty acids 1000 MG capsule Take 2 g by mouth daily.      . Flaxseed, Linseed, (FLAX SEEDS PO) Take 1 tablet by mouth daily.      Marland Kitchen glucosamine-chondroitin 500-400 MG tablet Take 1 tablet by mouth daily.      Marland Kitchen lidocaine-prilocaine (EMLA) cream Apply topically as needed.  30 g  5  . metoprolol succinate (TOPROL-XL) 50 MG 24 hr tablet Take 50 mg by mouth daily before breakfast. Take with or immediately following a meal.      . Multiple Vitamin (MULTIVITAMIN WITH MINERALS) TABS Take 1 tablet by mouth every other day.       Marland Kitchen omeprazole (PRILOSEC) 20 MG  capsule Take 20 mg by mouth daily. For heartburn      . warfarin (COUMADIN) 5 MG tablet Take one 5 mg tablet by mouth on Tuesday and Friday and 7.5 mg all other days or as directed by physician.  60 tablet  1  . hyaluronate sodium (RADIAPLEXRX) GEL Apply topically 2 (two) times daily.      . non-metallic deodorant Thornton Papas) MISC Apply 1 application topically daily as needed.      . ondansetron (ZOFRAN) 8 MG tablet Take 8 mg by mouth every 8 (eight) hours as needed for nausea.       Marland Kitchen PROVENTIL HFA 108 (90 BASE) MCG/ACT inhaler Inhale 2 puffs into the lungs every 4 (four) hours as needed.        No current facility-administered medications for this visit.      REVIEW OF SYSTEMS: A 10 point review of systems was completed and is negative except as noted above.    PHYSICAL EXAMINATION: BP 149/80  Pulse 74  Temp(Src) 97 F (36.1 C) (Oral)  Resp 20  Ht 5\' 1"  (1.549 m)  Wt 143 lb 8 oz (65.091 kg)  BMI 27.13 kg/m2     General appearance: Alert, cooperative, well nourished, no apparent distress Head:  Atraumatic, normocephalic Eyes: Conjunctivae/corneas clear, PERRLA, EOMI Nose: No drainage or sinus tenderness Neck: No adenopathy, supple, symmetrical, trachea midline, thyroid not enlarged, no tenderness Resp: Clear to auscultation bilaterally, diminished bibasilar breath sounds Cardio: Regular rate and  rhythm, S1, S2 normal, no murmur, click, rub or gallop, left chest Port-A-Cath covered with EMLA cream Breasts: right mastectomy site with erythema and moist desquamation, left breast no masses or nodules GI: Soft, distended, non-tender, hypoactive bowel sounds, no organomegaly Extremities: Extremities normal, atraumatic, no cyanosis, LE edema, LLE (+1)  is larger than RLE (trace edema), mild swelling in left upper extremity in forearm.   Lymph nodes: Cervical, supraclavicular, and axillary nodes normal Neurologic: Grossly normal  ECOG FS:  Grade 2 - Symptomatic, but completely ambulatory  LAB RESULTS: Lab Results  Component Value Date   WBC 6.2 07/01/2013   NEUTROABS 4.3 07/01/2013   HGB 11.3* 07/01/2013   HCT 33.1* 07/01/2013   MCV 86.0 07/01/2013   PLT 201 07/01/2013      Chemistry      Component Value Date/Time   NA 140 07/01/2013 1504   NA 140 02/10/2013 0400   K 3.7 07/01/2013 1504   K 3.4* 02/10/2013 0400   CL 105 05/20/2013 1459   CL 106 02/10/2013 0400   CO2 24 07/01/2013 1504   CO2 26 02/10/2013 0400   BUN 21.5 07/01/2013 1504   BUN 11 02/10/2013 0400   CREATININE 1.0 07/01/2013 1504   CREATININE 0.84 02/10/2013 0400      Component Value Date/Time   CALCIUM 9.2 07/01/2013 1504   CALCIUM 8.7 02/10/2013 0400   ALKPHOS 83 07/01/2013 1504   ALKPHOS 66 01/28/2013 1630   AST 25 07/01/2013 1504   AST 36 01/28/2013 1630   ALT 25 07/01/2013 1504   ALT 55* 01/28/2013 1630   BILITOT 0.31 07/01/2013 1504   BILITOT 0.2* 01/28/2013 1630      Lab Results  Component Value Date   LABCA2 16 07/19/2012     RADIOGRAPHIC STUDIES: No results found.  ASSESSMENT: 67 y.o. [place], [place] woman with:  1.  Inflammatory right breast cancer with positive lymph nodes the tumor is ER negative PR negative HER-2/neu positive.  Patient is s/p neoadjuvant chemotherapy consisting of FEC  q 2 weeks x 4 cycles.  Status post weekly Gemcitabine and Herceptin that was started on 11/19/12 and completed on 12/17/2012 after 5 cycles.    Herceptin Qweekly x 12 weeks started on 01/14/2013 and she will receive a Herceptin infusion today.  Patient underwent right mastectomy by Dr. Dwain Sarna on 02/08/13 with complete pathologic response to neoadjuvant chemotherapy. Adjuvant Herceptin every 3 weeks started on 02/25/13, and radiation therapy with Dr. Michell Heinrich began on 03/31/13.   2.  Bilateral lower extremity DVT and Bilateral PE.  The patient has a positive Prothrombin Gene Mutation, heterozygous. S/p IVC filter inserted on  12/27/2012. She will continue on Coumadin and take 7.5 mg three nights per week and 5mg  4 nights per week.   3.  Lower extremity edema-near resolved today.   PLAN:  1.  Bilateral DVT and PE.  INR is stable.  She will continue her current dose.    2. Patient will proceed with Herceptin today.   3. Ms. Narvaez will return in 3 weeks for her next cycle of Herceptin.         All questions were answered.  The patient was encouraged to contact us in the interim with any problems, questions or concerns.   I spent 25 minutes counseling the patient face to face.  The total time spent in the appointment was 30 minutes.  Drue Second, MD Medical/Oncology [facility] [phone] (beeper) [phone] (Office)

## 2013-07-11 ENCOUNTER — Telehealth: Payer: Self-pay | Admitting: *Deleted

## 2013-07-11 NOTE — Telephone Encounter (Signed)
Per staff phone call and POF I have schedueld appts.  JMW  

## 2013-07-22 ENCOUNTER — Ambulatory Visit (HOSPITAL_BASED_OUTPATIENT_CLINIC_OR_DEPARTMENT_OTHER): Payer: Medicare Other | Admitting: Adult Health

## 2013-07-22 ENCOUNTER — Encounter: Payer: Self-pay | Admitting: Adult Health

## 2013-07-22 ENCOUNTER — Other Ambulatory Visit (HOSPITAL_BASED_OUTPATIENT_CLINIC_OR_DEPARTMENT_OTHER): Payer: Medicare Other

## 2013-07-22 ENCOUNTER — Ambulatory Visit (HOSPITAL_BASED_OUTPATIENT_CLINIC_OR_DEPARTMENT_OTHER): Payer: Medicare Other

## 2013-07-22 VITALS — BP 126/76 | HR 79 | Temp 98.2°F | Resp 18 | Ht 61.0 in | Wt 147.3 lb

## 2013-07-22 DIAGNOSIS — I82409 Acute embolism and thrombosis of unspecified deep veins of unspecified lower extremity: Secondary | ICD-10-CM

## 2013-07-22 DIAGNOSIS — Z171 Estrogen receptor negative status [ER-]: Secondary | ICD-10-CM | POA: Diagnosis not present

## 2013-07-22 DIAGNOSIS — C773 Secondary and unspecified malignant neoplasm of axilla and upper limb lymph nodes: Secondary | ICD-10-CM | POA: Diagnosis not present

## 2013-07-22 DIAGNOSIS — R609 Edema, unspecified: Secondary | ICD-10-CM | POA: Diagnosis not present

## 2013-07-22 DIAGNOSIS — Z5112 Encounter for antineoplastic immunotherapy: Secondary | ICD-10-CM

## 2013-07-22 DIAGNOSIS — C50119 Malignant neoplasm of central portion of unspecified female breast: Secondary | ICD-10-CM

## 2013-07-22 DIAGNOSIS — I2699 Other pulmonary embolism without acute cor pulmonale: Secondary | ICD-10-CM | POA: Diagnosis not present

## 2013-07-22 DIAGNOSIS — C50111 Malignant neoplasm of central portion of right female breast: Secondary | ICD-10-CM

## 2013-07-22 DIAGNOSIS — C50919 Malignant neoplasm of unspecified site of unspecified female breast: Secondary | ICD-10-CM

## 2013-07-22 DIAGNOSIS — I82403 Acute embolism and thrombosis of unspecified deep veins of lower extremity, bilateral: Secondary | ICD-10-CM

## 2013-07-22 DIAGNOSIS — C50112 Malignant neoplasm of central portion of left female breast: Secondary | ICD-10-CM

## 2013-07-22 LAB — CBC WITH DIFFERENTIAL/PLATELET
BASO%: 0.5 % (ref 0.0–2.0)
EOS%: 2.4 % (ref 0.0–7.0)
LYMPH%: 18.1 % (ref 14.0–49.7)
MCHC: 34.1 g/dL (ref 31.5–36.0)
MCV: 87.2 fL (ref 79.5–101.0)
MONO%: 6.7 % (ref 0.0–14.0)
Platelets: 186 10*3/uL (ref 145–400)
RBC: 3.67 10*6/uL — ABNORMAL LOW (ref 3.70–5.45)
WBC: 6.2 10*3/uL (ref 3.9–10.3)
nRBC: 0 % (ref 0–0)

## 2013-07-22 LAB — COMPREHENSIVE METABOLIC PANEL (CC13)
ALT: 23 U/L (ref 0–55)
AST: 21 U/L (ref 5–34)
Creatinine: 0.9 mg/dL (ref 0.6–1.1)
Total Bilirubin: 0.31 mg/dL (ref 0.20–1.20)

## 2013-07-22 LAB — PROTIME-INR: Protime: 37.2 Seconds — ABNORMAL HIGH (ref 10.6–13.4)

## 2013-07-22 MED ORDER — ACETAMINOPHEN 325 MG PO TABS
650.0000 mg | ORAL_TABLET | Freq: Once | ORAL | Status: AC
  Administered 2013-07-22: 650 mg via ORAL

## 2013-07-22 MED ORDER — HEPARIN SOD (PORK) LOCK FLUSH 100 UNIT/ML IV SOLN
500.0000 [IU] | Freq: Once | INTRAVENOUS | Status: AC | PRN
  Administered 2013-07-22: 500 [IU]
  Filled 2013-07-22: qty 5

## 2013-07-22 MED ORDER — TRASTUZUMAB CHEMO INJECTION 440 MG
6.0000 mg/kg | Freq: Once | INTRAVENOUS | Status: AC
  Administered 2013-07-22: 378 mg via INTRAVENOUS
  Filled 2013-07-22: qty 18

## 2013-07-22 MED ORDER — SODIUM CHLORIDE 0.9 % IV SOLN
Freq: Once | INTRAVENOUS | Status: AC
  Administered 2013-07-22: 16:00:00 via INTRAVENOUS

## 2013-07-22 MED ORDER — SODIUM CHLORIDE 0.9 % IJ SOLN
10.0000 mL | INTRAMUSCULAR | Status: DC | PRN
  Administered 2013-07-22: 10 mL
  Filled 2013-07-22: qty 10

## 2013-07-22 NOTE — Progress Notes (Signed)
[facility]  Telephone:(336) [phone] Fax:(336) [phone]  OFFICE PROGRESS NOTE   PATIENT: Jessica Wells   DOB: November 16, 1946  MR#: [id number]  QVZ#:[id number]   PI:RJJOAC,[id number] STEWART, MD Lurline Hare, MD Emelia Loron, MD Arvilla Meres, MD Charlaine Dalton. Sherene Sires, MD    DIAGNOSIS:  67 year-old [place], [place] woman with a recent diagnosis of inflammatory breast cancer.   PRIOR THERAPY: 1.  The patient was originally seen in clinic for new diagnosis of inflammatory breast cancer, she was referred by Dr. Emelia Loron. She had a mammogram performed on 07/14/2012 that showed an abnormality. That was biopsied and it showed an invasive mammary carcinoma with lymphovascular invasion grade 3 ER negative PR negative HER-2/neu positive.   2.  The patient had a MRI of the breasts performed on 07/19/2012.  The MRI showed diffuse right breast neoplasm on a large level I right axillary lymph nodes compatible with lymphatic spread.  The patient has had a biopsy of the right axillary lymph node that is compatible with invasive mammary carcinoma.   3.  The patient began neoadjuvant FEC 100 with day 2 Neulasta support on 07/23/2012.   4.   Weekly neoadjuvant Taxol and Herceptin started on 09/17/12.  The patient developed neuropathies, and Taxol was discontinued early.  She completed 7 weeks of Taxol/Herceptin combination therapy.  The patient received 2 weeks of Herceptin only.   5. Weekly Gemcitabine and Herceptin was started on 11/19/12 and completed on 12/17/2012 after 5 cycles.  6.  A regimen of Lasix 20 mg PO and Kdur 10 mEq was started on 01/07/2013 due to chronic lower extremity edema.  Medications are only to be taken if patient has a weight gain of 5 lbs or more in a day (daily weights).   7.  Anemia was treated with blood transfusions of 1 unit of PRBCs given on 01/11/2013 and another unit of PRBC's given 01/12/2013.  8. Patient underwent right mastectomy on  02/08/13.  Pathology showed a complete response to chemotherapy in the breast and 11 nodes identified with fibrosis but no carcinoma.    9.  Radiation therapy from 03/31/13 through 05/17/13.  10.  Every 3 week herceptin started 02/25/13.    CURRENT THERAPY:  Herceptin every 3 weeks  INTERVAL HISTORY: [name] is doing well today.  She is here for evaluation prior to her adjuvant Herceptin. She is doing well today and denies fevers, chills, nausea, shortness of breath, chest pain, palpitations, or any further concerns.  A 10 point ROS is negative.    PAST MEDICAL HISTORY: Past Medical History  Diagnosis Date  . GERD (gastroesophageal reflux disease)   . Dysrhythmia     PAT-sees dr Laurence Compton meds  . Breast cancer 07/14/12    inflammatory right breast ca, ER/PR -  . Allergy   . PAT (paroxysmal atrial tachycardia)     hx  . Arthritis     osteopenia,knees  . PONV (postoperative nausea and vomiting) 09-13-12    severe, with Port-a-cath, was managed without PONV  . Clotting disorder     prothrombin gene mutation-heterozygous   . History of chemotherapy     Last dose to be 02-04-13.  Was rx'd with Herceptin & Gemzar  . DVT (deep venous thrombosis)   . Asthma     Uses Inhalers Proventil as needed  . Bursitis 01-25-13    Rt. shoulder- mildly affected now.  . History of blood transfusion 01-25-13    2 units 01-11-13  . Pneumonia 01-25-13  hx. 12-27-12-hospital stay x 9 days, now resolved.  . S/P radiation therapy 4-12 wks ago 03/31/13-05/17/13    right breast/supraclavicular fossa/posterior axillary boost    PAST SURGICAL HISTORY: Past Surgical History  Procedure Laterality Date  . Dilation and curettage of uterus      x3  . Tonsillectomy    . Colonoscopy    . Portacath placement  07/21/2012    Procedure: INSERTION PORT-A-CATH;  Surgeon: Emelia Loron, MD;  Location: [facility];  Service: General;  Laterality: Left;/ Replacement done 10'13  . Insertion of vena cava  filter  12/2012  . Giant cell tumor  01-25-13    removed Rt. forearm.  . Eye lid surgery      right( MD office)  . Mastectomy modified radical Right 02/08/2013    Procedure: MASTECTOMY MODIFIED RADICAL;  Surgeon: Emelia Loron, MD;  Location: [facility];  Service: General;  Laterality: Right;  . Breast surgery      FAMILY HISTORY: Family History  Problem Relation Age of Onset  . Diabetes Mother   . Heart disease Mother   . Hypertension Mother   . Hyperlipidemia Mother   . Breast cancer Mother 20    lobular breast cancer  . Heart disease Maternal Aunt   . Heart disease Maternal Uncle   . Heart disease Maternal Grandmother     died in her 28s from heart disease  . Lung cancer Father 43    adenocarcinoma  . Cancer Paternal Uncle     dx in mid 36s with a cancer in the leg, died late 56s; grandpaternal half uncle    SOCIAL HISTORY: History  Substance Use Topics  . Smoking status: Former Smoker -- 1.00 packs/day for 35 years    Types: Cigarettes    Quit date: 07/21/1979  . Smokeless tobacco: Never Used  . Alcohol Use: No    ALLERGIES: Allergies  Allergen Reactions  . Anesthetics, Amide Nausea And Vomiting    Projectile vomitting-Nausea- with 24hrs of dry heaves. With any anesthetics  . Bactrim [Sulfamethoxazole W-Trimethoprim] Other (See Comments)    Massive diarrhea, nausea vomiting, platelets dropped, hemoglobin dropped, admitted to hospital  . Sulfa Antibiotics Nausea And Vomiting and Other (See Comments)    Crazy feeling in head Life threatening reaction, decreased blood counts  . Codeine Nausea And Vomiting  . Codeine Phosphate Nausea And Vomiting  . Eggs Or Egg-Derived Products Hives     MEDICATIONS:  Current Outpatient Prescriptions  Medication Sig Dispense Refill  . b complex vitamins tablet Take 1 tablet by mouth daily.      . calcium-vitamin D (OSCAL WITH D) 500-200 MG-UNIT per tablet Take 1 tablet by mouth every other day.       . fish oil-omega-3 fatty  acids 1000 MG capsule Take 2 g by mouth daily.      . Flaxseed, Linseed, (FLAX SEEDS PO) Take 1 tablet by mouth daily.      Marland Kitchen glucosamine-chondroitin 500-400 MG tablet Take 1 tablet by mouth daily.      . hyaluronate sodium (RADIAPLEXRX) GEL Apply topically 2 (two) times daily.      . metoprolol succinate (TOPROL-XL) 50 MG 24 hr tablet Take 50 mg by mouth daily before breakfast. Take with or immediately following a meal.      . Multiple Vitamin (MULTIVITAMIN WITH MINERALS) TABS Take 1 tablet by mouth every other day.       . non-metallic deodorant Thornton Papas) MISC Apply 1 application topically daily as needed.      Marland Kitchen  omeprazole (PRILOSEC) 20 MG capsule Take 20 mg by mouth daily. For heartburn      . ondansetron (ZOFRAN) 8 MG tablet Take 8 mg by mouth every 8 (eight) hours as needed for nausea.       Marland Kitchen PROVENTIL HFA 108 (90 BASE) MCG/ACT inhaler Inhale 2 puffs into the lungs every 4 (four) hours as needed.       . warfarin (COUMADIN) 5 MG tablet Take one 5 mg tablet by mouth on Tuesday and Friday and 7.5 mg all other days or as directed by physician.  60 tablet  1   No current facility-administered medications for this visit.      REVIEW OF SYSTEMS: A 10 point review of systems was completed and is negative except as noted above.    PHYSICAL EXAMINATION: BP 126/76  Pulse 79  Temp(Src) 98.2 F (36.8 C)  Resp 18  Ht 5\' 1"  (1.549 m)  Wt 147 lb 5 oz (66.821 kg)  BMI 27.85 kg/m2     General appearance: Alert, cooperative, well nourished, no apparent distress Head:  Atraumatic, normocephalic Eyes: Conjunctivae/corneas clear, PERRLA, EOMI Nose: No drainage or sinus tenderness Neck: No adenopathy, supple, symmetrical, trachea midline, thyroid not enlarged, no tenderness Resp: Clear to auscultation bilaterally, diminished bibasilar breath sounds Cardio: Regular rate and rhythm, S1, S2 normal, no murmur, click, rub or gallop, left chest Port-A-Cath covered with EMLA cream Breasts: right  mastectomy site with erythema and moist desquamation, left breast no masses or nodules GI: Soft, distended, non-tender, hypoactive bowel sounds, no organomegaly Extremities: Extremities normal, atraumatic, no cyanosis, LE edema, LLE (+1)  is larger than RLE (trace edema), mild swelling in left upper extremity in forearm.   Lymph nodes: Cervical, supraclavicular, and axillary nodes normal Neurologic: Grossly normal  ECOG FS:  Grade 2 - Symptomatic, but completely ambulatory  LAB RESULTS: Lab Results  Component Value Date   WBC 6.2 07/22/2013   NEUTROABS 4.5 07/22/2013   HGB 10.9* 07/22/2013   HCT 32.0* 07/22/2013   MCV 87.2 07/22/2013   PLT 186 07/22/2013      Chemistry      Component Value Date/Time   NA 140 07/22/2013 1354   NA 140 02/10/2013 0400   K 3.5 07/22/2013 1354   K 3.4* 02/10/2013 0400   CL 105 05/20/2013 1459   CL 106 02/10/2013 0400   CO2 24 07/22/2013 1354   CO2 26 02/10/2013 0400   BUN 21.2 07/22/2013 1354   BUN 11 02/10/2013 0400   CREATININE 0.9 07/22/2013 1354   CREATININE 0.84 02/10/2013 0400      Component Value Date/Time   CALCIUM 9.0 07/22/2013 1354   CALCIUM 8.7 02/10/2013 0400   ALKPHOS 73 07/22/2013 1354   ALKPHOS 66 01/28/2013 1630   AST 21 07/22/2013 1354   AST 36 01/28/2013 1630   ALT 23 07/22/2013 1354   ALT 55* 01/28/2013 1630   BILITOT 0.31 07/22/2013 1354   BILITOT 0.2* 01/28/2013 1630      Lab Results  Component Value Date   LABCA2 16 07/19/2012     RADIOGRAPHIC STUDIES: No results found.  ASSESSMENT: 67 y.o. [place], [place] woman with:  1.  Inflammatory right breast cancer with positive lymph nodes the tumor is ER negative PR negative HER-2/neu positive.  Patient is s/p neoadjuvant chemotherapy consisting of FEC q 2 weeks x 4 cycles.  Status post weekly Gemcitabine and Herceptin that was started on 11/19/12 and completed on 12/17/2012 after 5 cycles.   Herceptin Qweekly x  12 weeks started on 01/14/2013 and she will receive a Herceptin infusion today.   Patient underwent right mastectomy by Dr. Dwain Sarna on 02/08/13 with complete pathologic response to neoadjuvant chemotherapy. Adjuvant Herceptin every 3 weeks started on 02/25/13, and radiation therapy with Dr. Michell Heinrich began on 03/31/13 through 05/17/13.   2.  Bilateral lower extremity DVT and Bilateral PE.  The patient has a positive Prothrombin Gene Mutation, heterozygous. S/p IVC filter inserted on  12/27/2012. She will continue on Coumadin and take 7.5 mg five nights per week and 5mg  two nights per week.   3.  Lower extremity edema-near resolved today.   PLAN:  1. Doing well. Patient will proceed with Herceptin therapy today.  Her last echo and Dr. Gala Romney appointment was on 06/20/13.  She was cleared to continue herceptin.    2. Ms. Prestwood will return in 3 weeks for her next herceptin appointment.   All questions were answered.  The patient was encouraged to contact us in the interim with any problems, questions or concerns.   I spent 15 minutes counseling the patient face to face.  The total time spent in the appointment was 30 minutes.  Cherie Ouch Lyn Hollingshead, NP Medical Oncology [facility] Phone: [phone]

## 2013-07-22 NOTE — Patient Instructions (Addendum)
[facility] Discharge Instructions for Patients Receiving Chemotherapy  Today you received the following chemotherapy agents Herceptin.  To help prevent nausea and vomiting after your treatment, we encourage you to take your nausea medication as prescribed.   If you develop nausea and vomiting that is not controlled by your nausea medication, call the clinic.   BELOW ARE SYMPTOMS THAT SHOULD BE REPORTED IMMEDIATELY:  *FEVER GREATER THAN 100.5 F  *CHILLS WITH OR WITHOUT FEVER  NAUSEA AND VOMITING THAT IS NOT CONTROLLED WITH YOUR NAUSEA MEDICATION  *UNUSUAL SHORTNESS OF BREATH  *UNUSUAL BRUISING OR BLEEDING  TENDERNESS IN MOUTH AND THROAT WITH OR WITHOUT PRESENCE OF ULCERS  *URINARY PROBLEMS  *BOWEL PROBLEMS  UNUSUAL RASH Items with * indicate a potential emergency and should be followed up as soon as possible.  Feel free to call the clinic you have any questions or concerns. The clinic phone number is [phone].    

## 2013-08-04 DIAGNOSIS — H04129 Dry eye syndrome of unspecified lacrimal gland: Secondary | ICD-10-CM | POA: Diagnosis not present

## 2013-08-10 IMAGING — CT CT CHEST W/ CM
2 of 6 series · 14 of 46 positions shown, 18 images · IV contrast (OMNIPAQUE)
Comparison: CT chest dated 12/27/2012.  CT chest abdomen pelvis
dated 07/26/2012.

CT CHEST

CLINICAL DATA: Right breast cancer status post mastectomy 3 weeks
ago, herceptin in progress, chemotherapy complete.  Adenocarcinoma
noted on bronchial lavage, evaluate for lung cancer.

CT CHEST, ABDOMEN AND PELVIS WITH CONTRAST
TECHNIQUE: Multidetector CT imaging of the chest, abdomen and
pelvis was performed following the standard protocol during bolus
administration of intravenous contrast.
Contrast: 100mL OMNIPAQUE IOHEXOL 300 MG/ML  SOLN

[Series 2: cap with st · axial · 0.72mm/px · z∈[-515,-25]mm · 11 of 112 slices shown, 15 images]
[im 7/112  soft-tissue]
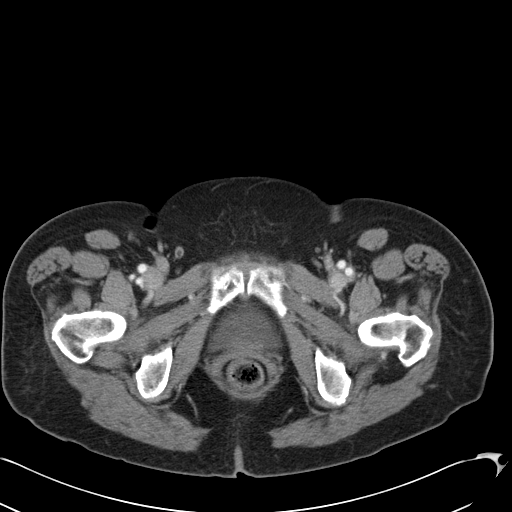
[im 7/112  bone]
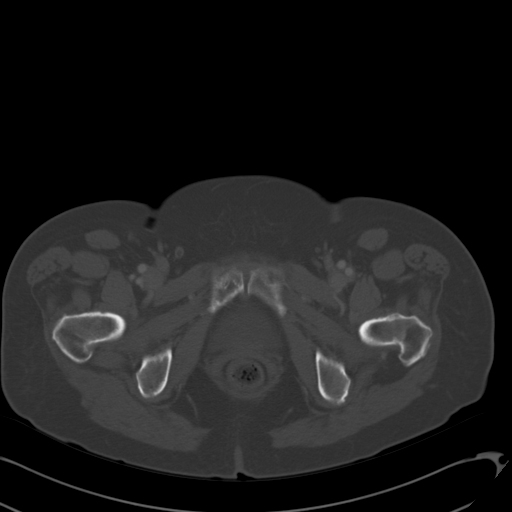
[im 20/112  soft-tissue]
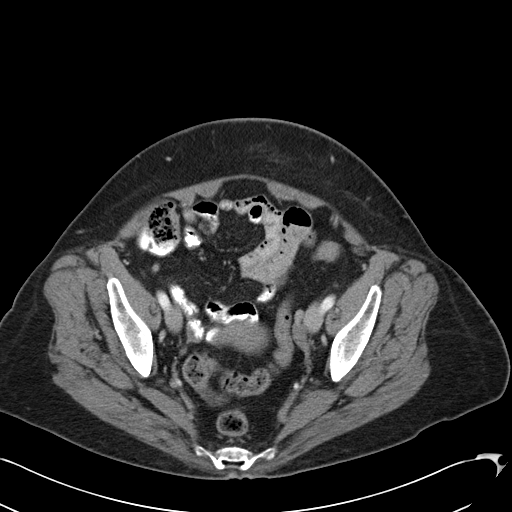
[im 33/112  soft-tissue]
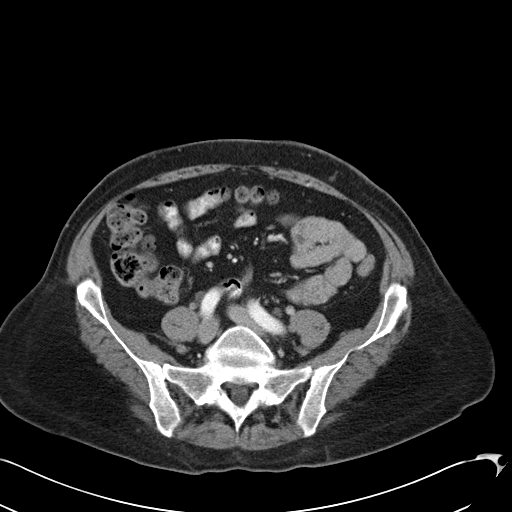
[im 46/112  soft-tissue]
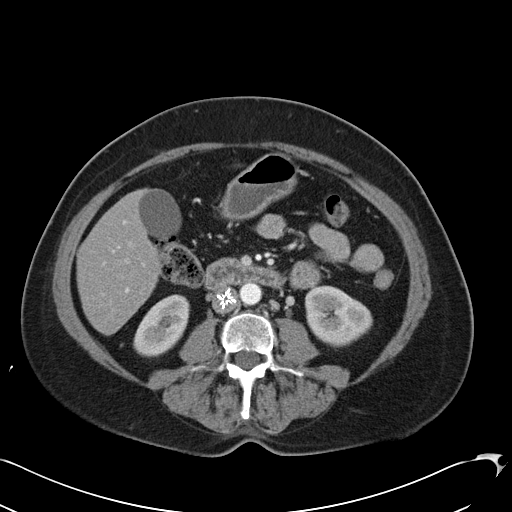
[im 59/112  soft-tissue]
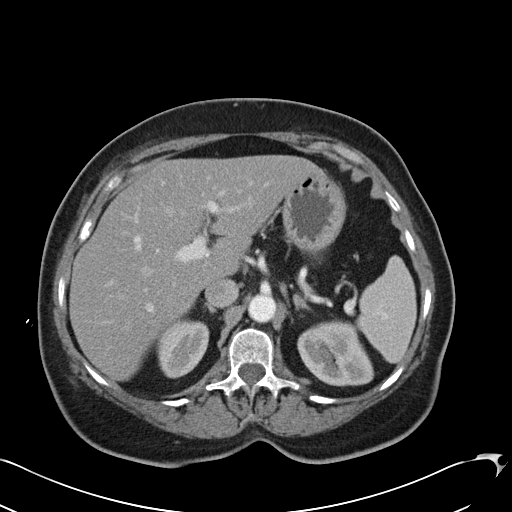
[im 66/112  soft-tissue]
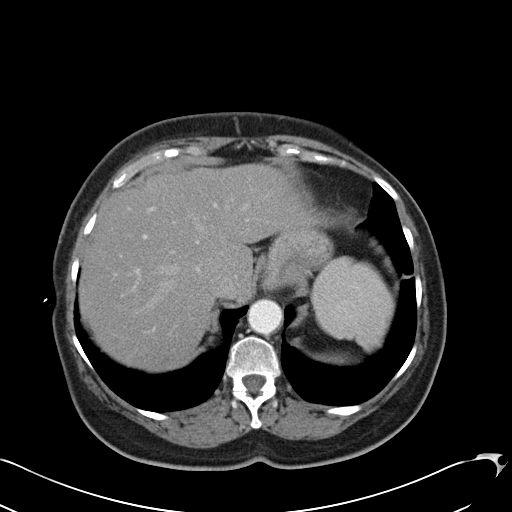
[im 79/112  soft-tissue]
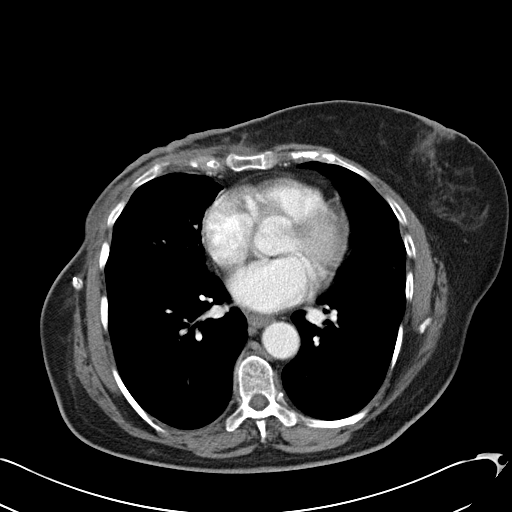
[im 85/112  lung]
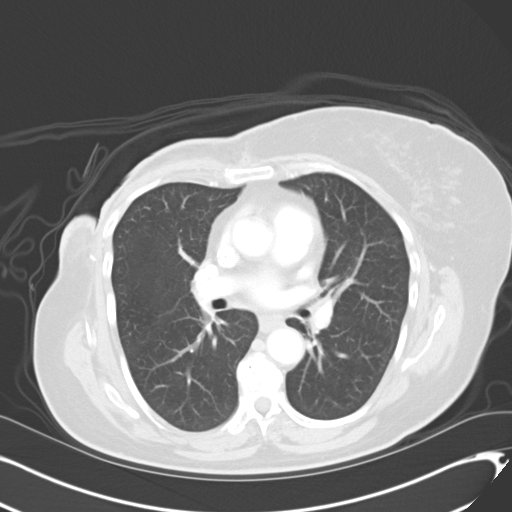
[im 92/112  soft-tissue]
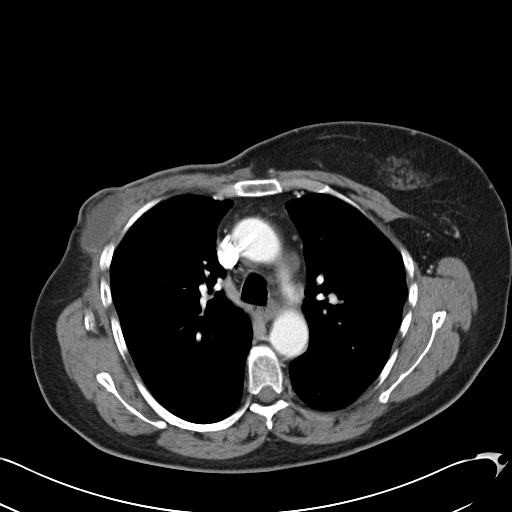
[im 92/112  lung]
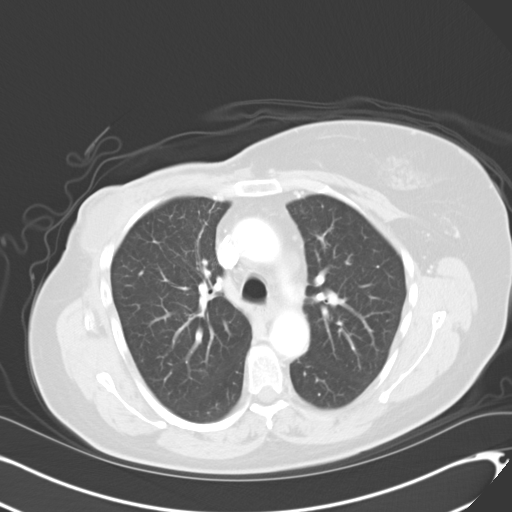
[im 98/112  lung]
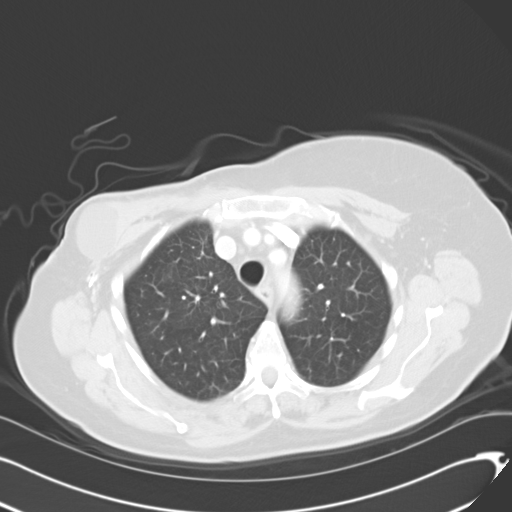
[im 105/112  soft-tissue]
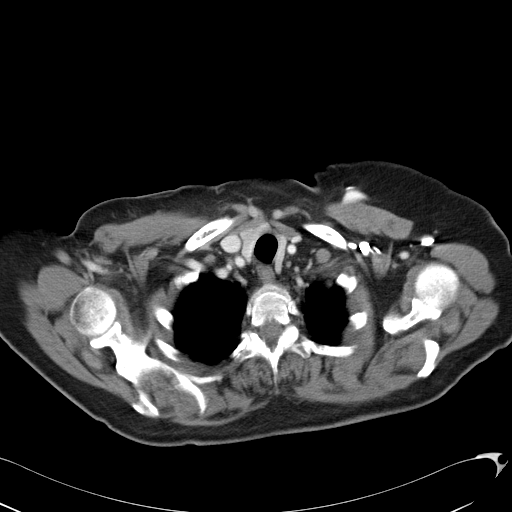
[im 105/112  lung]
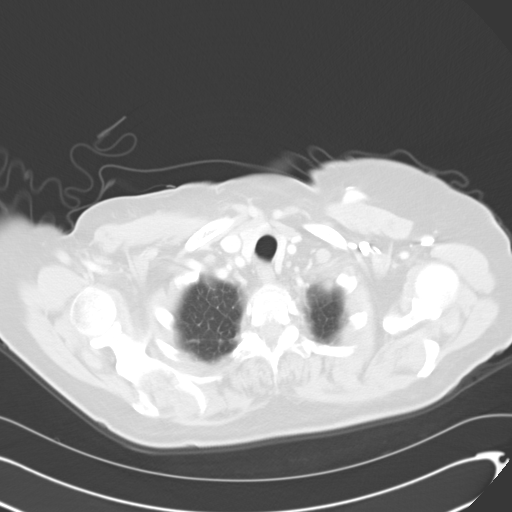
[im 105/112  bone]
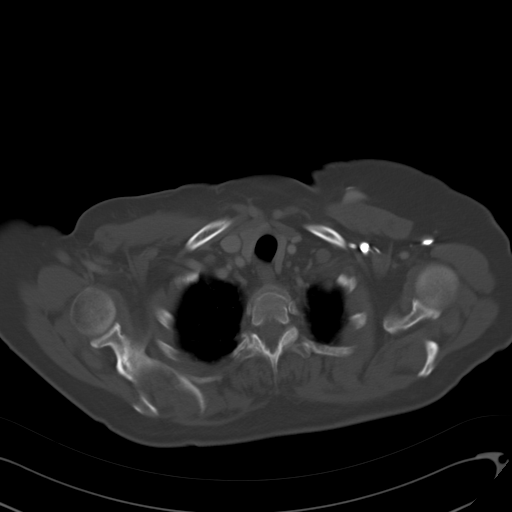

[Series 602: <mpr thick range> · coronal · 1.09mm/px · 3 of 92 slices shown]
[im 31/92  soft-tissue]
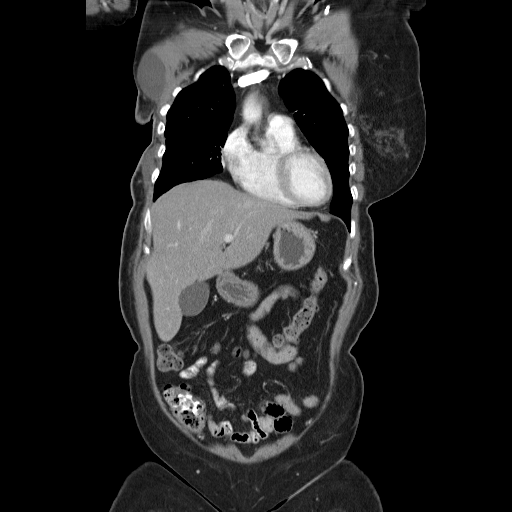
[im 41/92  soft-tissue]
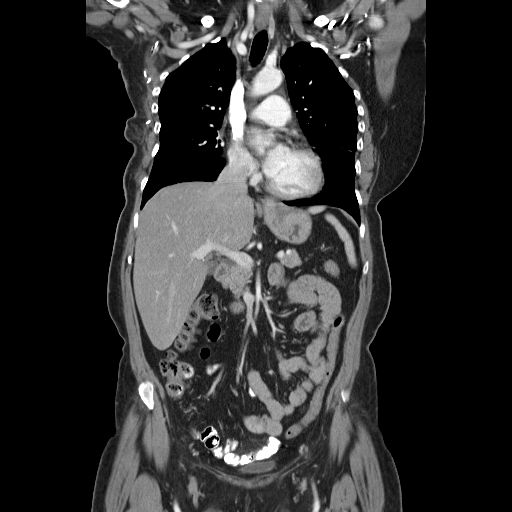
[im 51/92  soft-tissue]
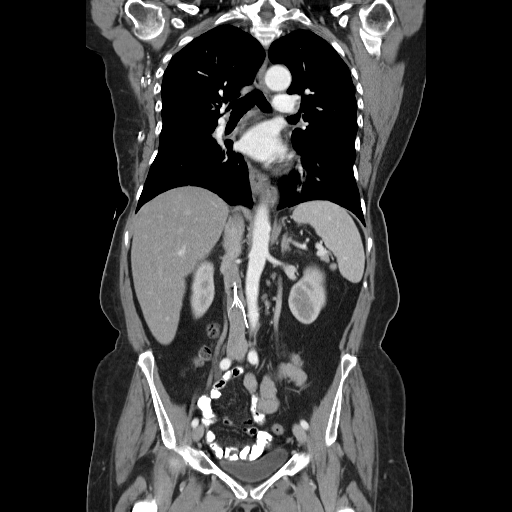

[14 of 46 positions shown; findings below may reference images not displayed]

FINDINGS: 4 mm triangular subpleural nodule in the left lower lobe
(series 5/image 42), unchanged since 7424, likely reflecting a
benign subpleural node.  Additional 3 mm subpleural nodule in the
left upper lobe (series 5/image 27), also unchanged and likely
benign.

Small residual tree-in-bud nodular opacities in the right upper
lobe (series 5/images 12, 14, and 19), likely
infectious/inflammatory given the clustered appearance.

Right apical pleural parenchymal scarring.  Linear scarring versus
atelectasis in the lingula (series 5/image 34).  Linear scarring in
the left lower lobe.  No pleural effusion or pneumothorax.

The heart is normal in size.  No pericardial effusion.

8 mm short axis precarinal node (series 2/image 23), which does not
meet pathologic CT size criteria.  No suspicious hilar or axillary
lymphadenopathy.

Status post right mastectomy with right axillary lymph node
dissection.  Associated fluid/stranding in the right chest wall
(series 2/image 38).  Indwelling surgical drain with 4.4 x 3.1 cm
seroma (series 2/image 17).

Mild degenerative changes of the thoracic spine.
IMPRESSION: Status post right mastectomy with right axillary lymph node
dissection.  Associated postsurgical changes with 4.4 x 3.1 cm
chest wall seroma.

No evidence of metastatic disease in the chest.

No findings to suggest primary bronchogenic neoplasm.

Two 3-4 mm nodules in the left upper and lower lobes, unchanged
since 7424, likely benign.  Continued attention on follow-up is
suggested to document 1-year stability.

Residual tree-in-bud nodularity in the right upper lobe, likely
infectious/inflammatory.

CT ABDOMEN AND PELVIS
FINDINGS: Small hiatal hernia.

Liver, spleen, pancreas, and adrenal glands are within normal
limits.

Gallbladder is unremarkable.  No intrahepatic or extrahepatic
ductal dilatation.

Kidneys are within normal limits.  No hydronephrosis.

No evidence of bowel obstruction.  Normal appendix.

No evidence of abdominal aortic aneurysm.  IVC filter.

No abdominopelvic ascites.

No suspicious abdominopelvic lymphadenopathy.

Uterus and bilateral ovaries are unremarkable.

Bladder is within normal limits.

Suspected subcutaneous injection sites within the anterior
abdominal wall (series 2/image 87).

Mild degenerative changes of the thoracic spine.  Mild sclerosis in
the right parasymphyseal region (series 2/image 404), unchanged.
IMPRESSION: No evidence of metastatic disease in the abdomen/pelvis.

## 2013-08-12 ENCOUNTER — Encounter: Payer: Self-pay | Admitting: Adult Health

## 2013-08-12 ENCOUNTER — Ambulatory Visit (HOSPITAL_BASED_OUTPATIENT_CLINIC_OR_DEPARTMENT_OTHER): Payer: Medicare Other | Admitting: Adult Health

## 2013-08-12 ENCOUNTER — Other Ambulatory Visit (HOSPITAL_BASED_OUTPATIENT_CLINIC_OR_DEPARTMENT_OTHER): Payer: Medicare Other | Admitting: Lab

## 2013-08-12 ENCOUNTER — Ambulatory Visit (HOSPITAL_BASED_OUTPATIENT_CLINIC_OR_DEPARTMENT_OTHER): Payer: Medicare Other

## 2013-08-12 ENCOUNTER — Other Ambulatory Visit: Payer: Medicare Other | Admitting: Lab

## 2013-08-12 VITALS — BP 138/73 | HR 70 | Temp 98.1°F | Resp 20 | Ht 61.0 in | Wt 146.5 lb

## 2013-08-12 DIAGNOSIS — L03019 Cellulitis of unspecified finger: Secondary | ICD-10-CM | POA: Diagnosis not present

## 2013-08-12 DIAGNOSIS — I82403 Acute embolism and thrombosis of unspecified deep veins of lower extremity, bilateral: Secondary | ICD-10-CM

## 2013-08-12 DIAGNOSIS — C50919 Malignant neoplasm of unspecified site of unspecified female breast: Secondary | ICD-10-CM

## 2013-08-12 DIAGNOSIS — I2699 Other pulmonary embolism without acute cor pulmonale: Secondary | ICD-10-CM | POA: Diagnosis not present

## 2013-08-12 DIAGNOSIS — D649 Anemia, unspecified: Secondary | ICD-10-CM

## 2013-08-12 DIAGNOSIS — D801 Nonfamilial hypogammaglobulinemia: Secondary | ICD-10-CM

## 2013-08-12 DIAGNOSIS — R609 Edema, unspecified: Secondary | ICD-10-CM | POA: Diagnosis not present

## 2013-08-12 DIAGNOSIS — C773 Secondary and unspecified malignant neoplasm of axilla and upper limb lymph nodes: Secondary | ICD-10-CM

## 2013-08-12 DIAGNOSIS — IMO0002 Reserved for concepts with insufficient information to code with codable children: Secondary | ICD-10-CM

## 2013-08-12 DIAGNOSIS — Z5112 Encounter for antineoplastic immunotherapy: Secondary | ICD-10-CM

## 2013-08-12 DIAGNOSIS — Z171 Estrogen receptor negative status [ER-]: Secondary | ICD-10-CM | POA: Diagnosis not present

## 2013-08-12 DIAGNOSIS — C50112 Malignant neoplasm of central portion of left female breast: Secondary | ICD-10-CM

## 2013-08-12 DIAGNOSIS — C50111 Malignant neoplasm of central portion of right female breast: Secondary | ICD-10-CM

## 2013-08-12 LAB — COMPREHENSIVE METABOLIC PANEL (CC13)
ALT: 27 U/L (ref 0–55)
BUN: 25 mg/dL (ref 7.0–26.0)
CO2: 25 mEq/L (ref 22–29)
Calcium: 9.2 mg/dL (ref 8.4–10.4)
Creatinine: 1.1 mg/dL (ref 0.6–1.1)
Glucose: 131 mg/dl (ref 70–140)
Total Bilirubin: 0.27 mg/dL (ref 0.20–1.20)

## 2013-08-12 LAB — CBC WITH DIFFERENTIAL/PLATELET
Eosinophils Absolute: 0.2 10*3/uL (ref 0.0–0.5)
HCT: 30.9 % — ABNORMAL LOW (ref 34.8–46.6)
LYMPH%: 23.3 % (ref 14.0–49.7)
MONO#: 0.4 10*3/uL (ref 0.1–0.9)
NEUT#: 3.4 10*3/uL (ref 1.5–6.5)
Platelets: 194 10*3/uL (ref 145–400)
RBC: 3.52 10*6/uL — ABNORMAL LOW (ref 3.70–5.45)
WBC: 5.3 10*3/uL (ref 3.9–10.3)
lymph#: 1.2 10*3/uL (ref 0.9–3.3)

## 2013-08-12 MED ORDER — SODIUM CHLORIDE 0.9 % IJ SOLN
10.0000 mL | INTRAMUSCULAR | Status: DC | PRN
  Administered 2013-08-12: 10 mL
  Filled 2013-08-12: qty 10

## 2013-08-12 MED ORDER — CEPHALEXIN 500 MG PO CAPS: 500.0000 mg | ORAL_CAPSULE | Freq: Four times a day (QID) | ORAL | Status: DC

## 2013-08-12 MED ORDER — HEPARIN SOD (PORK) LOCK FLUSH 100 UNIT/ML IV SOLN
500.0000 [IU] | Freq: Once | INTRAVENOUS | Status: AC | PRN
  Administered 2013-08-12: 500 [IU]
  Filled 2013-08-12: qty 5

## 2013-08-12 MED ORDER — DIPHENHYDRAMINE HCL 25 MG PO CAPS: 50.0000 mg | ORAL_CAPSULE | Freq: Once | ORAL | Status: DC

## 2013-08-12 MED ORDER — ACETAMINOPHEN 325 MG PO TABS
ORAL_TABLET | ORAL | Status: AC
  Filled 2013-08-12: qty 2

## 2013-08-12 MED ORDER — ACETAMINOPHEN 325 MG PO TABS
650.0000 mg | ORAL_TABLET | Freq: Once | ORAL | Status: AC
  Administered 2013-08-12: 650 mg via ORAL

## 2013-08-12 MED ORDER — TRASTUZUMAB CHEMO INJECTION 440 MG
6.0000 mg/kg | Freq: Once | INTRAVENOUS | Status: AC
  Administered 2013-08-12: 378 mg via INTRAVENOUS
  Filled 2013-08-12: qty 18

## 2013-08-12 MED ORDER — SODIUM CHLORIDE 0.9 % IV SOLN
Freq: Once | INTRAVENOUS | Status: AC
  Administered 2013-08-12: 16:00:00 via INTRAVENOUS

## 2013-08-12 NOTE — Patient Instructions (Signed)
Paronychia Paronychia is an inflammatory reaction involving the folds of the skin surrounding the fingernail. This is commonly caused by an infection in the skin around a nail. The most common cause of paronychia is frequent wetting of the hands (as seen with bartenders, food servers, nurses or others who wet their hands). This makes the skin around the fingernail susceptible to infection by bacteria (germs) or fungus. Other predisposing factors are:  Aggressive manicuring.  Nail biting.  Thumb sucking. The most common cause is a staphylococcal (a type of germ) infection, or a fungal (Candida) infection. When caused by a germ, it usually comes on suddenly with redness, swelling, pus and is often painful. It may get under the nail and form an abscess (collection of pus), or form an abscess around the nail. If the nail itself is infected with a fungus, the treatment is usually prolonged and may require oral medicine for up to one year. Your caregiver will determine the length of time treatment is required. The paronychia caused by bacteria (germs) may largely be avoided by not pulling on hangnails or picking at cuticles. When the infection occurs at the tips of the finger it is called felon. When the cause of paronychia is from the herpes simplex virus (HSV) it is called herpetic whitlow. TREATMENT  When an abscess is present treatment is often incision and drainage. This means that the abscess must be cut open so the pus can get out. When this is done, the following home care instructions should be followed. HOME CARE INSTRUCTIONS   It is important to keep the affected fingers very dry. Rubber or plastic gloves over cotton gloves should be used whenever the hand must be placed in water.  Keep wound clean, dry and dressed as suggested by your caregiver between warm soaks or warm compresses.  Soak in warm water for fifteen to twenty minutes three to four times per day for bacterial infections. Fungal  infections are very difficult to treat, so often require treatment for long periods of time.  For bacterial (germ) infections take antibiotics (medicine which kill germs) as directed and finish the prescription, even if the problem appears to be solved before the medicine is gone.  Only take over-the-counter or prescription medicines for pain, discomfort, or fever as directed by your caregiver. SEEK IMMEDIATE MEDICAL CARE IF:  You have redness, swelling, or increasing pain in the wound.  You notice pus coming from the wound.  You have a fever.  You notice a bad smell coming from the wound or dressing. Document Released: 05/13/2001 Document Revised: 02/09/2012 Document Reviewed: 01/12/2009 [facility] Patient Information 2014 [place], [place]. Cephalexin tablets or capsules What is this medicine? CEPHALEXIN (sef a LEX in) is a cephalosporin antibiotic. It is used to treat certain kinds of bacterial infections It will not work for colds, flu, or other viral infections. This medicine may be used for other purposes; ask your health care provider or pharmacist if you have questions. What should I tell my health care provider before I take this medicine? They need to know if you have any of these conditions: -kidney disease -stomach or intestine problems, especially colitis -an unusual or allergic reaction to cephalexin, other cephalosporins, penicillins, other antibiotics, medicines, foods, dyes or preservatives -pregnant or trying to get pregnant -breast-feeding How should I use this medicine? Take this medicine by mouth with a full glass of water. Follow the directions on the prescription label. This medicine can be taken with or without food. Take your medicine  at regular intervals. Do not take your medicine more often than directed. Take all of your medicine as directed even if you think you are better. Do not skip doses or stop your medicine early. Talk to your pediatrician regarding the use  of this medicine in children. While this drug may be prescribed for selected conditions, precautions do apply. Overdosage: If you think you have taken too much of this medicine contact a poison control center or emergency room at once. NOTE: This medicine is only for you. Do not share this medicine with others. What if I miss a dose? If you miss a dose, take it as soon as you can. If it is almost time for your next dose, take only that dose. Do not take double or extra doses. There should be at least 4 to 6 hours between doses. What may interact with this medicine? -probenecid -some other antibiotics This list may not describe all possible interactions. Give your health care provider a list of all the medicines, herbs, non-prescription drugs, or dietary supplements you use. Also tell them if you smoke, drink alcohol, or use illegal drugs. Some items may interact with your medicine. What should I watch for while using this medicine? Tell your doctor or health care professional if your symptoms do not begin to improve in a few days. Do not treat diarrhea with over the counter products. Contact your doctor if you have diarrhea that lasts more than 2 days or if it is severe and watery. If you have diabetes, you may get a false-positive result for sugar in your urine. Check with your doctor or health care professional. What side effects may I notice from receiving this medicine? Side effects that you should report to your doctor or health care professional as soon as possible: -allergic reactions like skin rash, itching or hives, swelling of the face, lips, or tongue -breathing problems -pain or trouble passing urine -redness, blistering, peeling or loosening of the skin, including inside the mouth -severe or watery diarrhea -unusually weak or tired -yellowing of the eyes, skin Side effects that usually do not require medical attention (report to your doctor or health care professional if they  continue or are bothersome): -gas or heartburn -genital or anal irritation -headache -joint or muscle pain -nausea, vomiting This list may not describe all possible side effects. Call your doctor for medical advice about side effects. You may report side effects to FDA at 1-800-FDA-1088. Where should I keep my medicine? Keep out of the reach of children. Store at room temperature between 59 and 86 degrees F (15 and 30 degrees C). Throw away any unused medicine after the expiration date. NOTE: This sheet is a summary. It may not cover all possible information. If you have questions about this medicine, talk to your doctor, pharmacist, or health care provider.  2013, [employer]vier/Gold Standard. (02/21/2008 5:09:13 PM)

## 2013-08-12 NOTE — Progress Notes (Signed)
[facility]  [phone] [phone]) [phone]  OFFICE PROGRESS NOTE   PATIENT: Jessica Wells   DOB: 11-05-1946  MR#: [id number]  ZHY#:[id number]   EX:BMWUXL,KGMWNUUVO STEWART, MD Lurline Hare, MD Emelia Loron, MD Arvilla Meres, MD Charlaine Dalton. Sherene Sires, MD    DIAGNOSIS:  67 year-old [place], [place] woman with a recent diagnosis of inflammatory breast cancer.   PRIOR THERAPY: 1.  The patient was originally seen in clinic for new diagnosis of inflammatory breast cancer, she was referred by Dr. Emelia Loron. She had a mammogram performed on 07/14/2012 that showed an abnormality. That was biopsied and it showed an invasive mammary carcinoma with lymphovascular invasion grade 3 ER negative PR negative HER-2/neu positive.   2.  The patient had a MRI of the breasts performed on 07/19/2012.  The MRI showed diffuse right breast neoplasm on a large level I right axillary lymph nodes compatible with lymphatic spread.  The patient has had a biopsy of the right axillary lymph node that is compatible with invasive mammary carcinoma.   3.  The patient began neoadjuvant FEC 100 with day 2 Neulasta support on 07/23/2012.   4.   Weekly neoadjuvant Taxol and Herceptin started on 09/17/12.  The patient developed neuropathies, and Taxol was discontinued early.  She completed 7 weeks of Taxol/Herceptin combination therapy.  The patient then received 2 weeks of Herceptin only.   5. Weekly Gemcitabine and Herceptin was started on 11/19/12 and completed on 12/17/2012 after 5 cycles.  6.  A regimen of Lasix 20 mg PO and Kdur 10 mEq was started on 01/07/2013 due to chronic lower extremity edema.  Medications are only to be taken if patient has a weight gain of 5 lbs or more in a day (daily weights).   7.  Anemia was treated with blood transfusions of 1 unit of PRBCs given on 01/11/2013 and another unit of PRBC's given 01/12/2013.  8. Patient underwent right mastectomy on  02/08/13.  Pathology showed a complete response to chemotherapy in the breast and 11 nodes identified with fibrosis but no carcinoma.    9.  Radiation therapy from 03/31/13 through 05/17/13.  10.  Every 3 week herceptin started 02/25/13.    CURRENT THERAPY:  Herceptin every 3 weeks  INTERVAL HISTORY: Ms. Foulk is doing well today.  She is here for evaluation prior to Herceptin.  She is c/o itching and hives.  The itching started about 3 weeks ago and the hives developed in the past week.  She's been taking 12.5mg  of benadryl and zyrtec daily.  That seems to help.  She hasn't changed any lotions, body wash, detergent, dietary foods, or medications.  She'd also like to get an egg free flu shot if available.  She also has redness, swelling, and pain around her right thumb nail that has worsened this past week.  She has tried soaks and triple antibiotic ointment and it is isn't improving.  She denies fevers, and it isn't getting larger with her topical management.  Otherwise, she's doing well and w/o questions/concerns.    PAST MEDICAL HISTORY: Past Medical History  Diagnosis Date  . GERD (gastroesophageal reflux disease)   . Dysrhythmia     PAT-sees dr Laurence Compton meds  . Breast cancer 07/14/12    inflammatory right breast ca, ER/PR -  . Allergy   . PAT (paroxysmal atrial tachycardia)     hx  . Arthritis     osteopenia,knees  . PONV (postoperative nausea and vomiting) 09-13-12  severe, with Port-a-cath, was managed without PONV  . Clotting disorder     prothrombin gene mutation-heterozygous   . History of chemotherapy     Last dose to be 02-04-13.  Was rx'd with Herceptin & Gemzar  . DVT (deep venous thrombosis)   . Asthma     Uses Inhalers Proventil as needed  . Bursitis 01-25-13    Rt. shoulder- mildly affected now.  . History of blood transfusion 01-25-13    2 units 01-11-13  . Pneumonia 01-25-13    hx. 12-27-12-hospital stay x 9 days, now resolved.  . S/P radiation therapy 4-12 wks ago  03/31/13-05/17/13    right breast/supraclavicular fossa/posterior axillary boost    PAST SURGICAL HISTORY: Past Surgical History  Procedure Laterality Date  . Dilation and curettage of uterus      x3  . Tonsillectomy    . Colonoscopy    . Portacath placement  07/21/2012    Procedure: INSERTION PORT-A-CATH;  Surgeon: Emelia Loron, MD;  Location: [facility];  Service: General;  Laterality: Left;/ Replacement done 10'13  . Insertion of vena cava filter  12/2012  . Giant cell tumor  01-25-13    removed Rt. forearm.  . Eye lid surgery      right( MD office)  . Mastectomy modified radical Right 02/08/2013    Procedure: MASTECTOMY MODIFIED RADICAL;  Surgeon: Emelia Loron, MD;  Location: [facility];  Service: General;  Laterality: Right;  . Breast surgery      FAMILY HISTORY: Family History  Problem Relation Age of Onset  . Diabetes Mother   . Heart disease Mother   . Hypertension Mother   . Hyperlipidemia Mother   . Breast cancer Mother 66    lobular breast cancer  . Heart disease Maternal Aunt   . Heart disease Maternal Uncle   . Heart disease Maternal Grandmother     died in her 52s from heart disease  . Lung cancer Father 49    adenocarcinoma  . Cancer Paternal Uncle     dx in mid 21s with a cancer in the leg, died late 71s; grandpaternal half uncle    SOCIAL HISTORY: History  Substance Use Topics  . Smoking status: Former Smoker -- 1.00 packs/day for 35 years    Types: Cigarettes    Quit date: 07/21/1979  . Smokeless tobacco: Never Used  . Alcohol Use: No    ALLERGIES: Allergies  Allergen Reactions  . Anesthetics, Amide Nausea And Vomiting    Projectile vomitting-Nausea- with 24hrs of dry heaves. With any anesthetics  . Bactrim [Sulfamethoxazole W-Trimethoprim] Other (See Comments)    Massive diarrhea, nausea vomiting, platelets dropped, hemoglobin dropped, admitted to hospital  . Sulfa Antibiotics Nausea And Vomiting and Other (See Comments)     Crazy feeling in head Life threatening reaction, decreased blood counts  . Codeine Nausea And Vomiting  . Codeine Phosphate Nausea And Vomiting  . Eggs Or Egg-Derived Products Hives     MEDICATIONS:  Current Outpatient Prescriptions  Medication Sig Dispense Refill  . b complex vitamins tablet Take 1 tablet by mouth daily.      . calcium-vitamin D (OSCAL WITH D) 500-200 MG-UNIT per tablet Take 1 tablet by mouth every other day.       . fish oil-omega-3 fatty acids 1000 MG capsule Take 2 g by mouth daily.      . Flaxseed, Linseed, (FLAX SEEDS PO) Take 1 tablet by mouth daily.      Marland Kitchen glucosamine-chondroitin 500-400  MG tablet Take 1 tablet by mouth daily.      . hyaluronate sodium (RADIAPLEXRX) GEL Apply topically 2 (two) times daily.      . metoprolol succinate (TOPROL-XL) 50 MG 24 hr tablet Take 50 mg by mouth daily before breakfast. Take with or immediately following a meal.      . Multiple Vitamin (MULTIVITAMIN WITH MINERALS) TABS Take 1 tablet by mouth every other day.       . non-metallic deodorant Thornton Papas) MISC Apply 1 application topically daily as needed.      Marland Kitchen omeprazole (PRILOSEC) 20 MG capsule Take 20 mg by mouth daily. For heartburn      . ondansetron (ZOFRAN) 8 MG tablet Take 8 mg by mouth every 8 (eight) hours as needed for nausea.       Marland Kitchen PROVENTIL HFA 108 (90 BASE) MCG/ACT inhaler Inhale 2 puffs into the lungs every 4 (four) hours as needed.       . warfarin (COUMADIN) 5 MG tablet Take one 5 mg tablet by mouth on Tuesday and Friday and 7.5 mg all other days or as directed by physician.  60 tablet  1  . cephALEXin (KEFLEX) 500 MG capsule Take 1 capsule (500 mg total) by mouth 4 (four) times daily.  40 capsule  0   No current facility-administered medications for this visit.      REVIEW OF SYSTEMS: A 10 point review of systems was completed and is negative except as noted above.    PHYSICAL EXAMINATION: BP 138/73  Pulse 70  Temp(Src) 98.1 F (36.7 C) (Oral)  Resp 20   Ht 5\' 1"  (1.549 m)  Wt 146 lb 8 oz (66.452 kg)  BMI 27.7 kg/m2    General: Patient is a well appearing female in no acute distress HEENT: PERRLA, sclerae anicteric no conjunctival pallor, MMM Neck: supple, no palpable adenopathy Lungs: clear to auscultation bilaterally, no wheezes, rhonchi, or rales Cardiovascular: regular rate rhythm, S1, S2, no murmurs, rubs or gallops Abdomen: Soft, non-tender, non-distended, normoactive bowel sounds, no HSM Extremities: Extremities normal, atraumatic, no cyanosis, scant bilateral lower extremity edema, mild swelling in left upper extremity in forearm.  Right thumb erythema and swelling and warmth surrounding the nail bed. Skin:  Hive appearing rash on back.   Breasts: right mastectomy site with no nodularity or sign of recurrence, left breast no masses or nodules ECOG FS:  Grade 1  LAB RESULTS: Lab Results  Component Value Date   WBC 5.3 08/12/2013   NEUTROABS 3.4 08/12/2013   HGB 10.7* 08/12/2013   HCT 30.9* 08/12/2013   MCV 87.8 08/12/2013   PLT 194 08/12/2013      Chemistry      Component Value Date/Time   NA 142 08/12/2013 1359   NA 140 02/10/2013 0400   K 3.7 08/12/2013 1359   K 3.4* 02/10/2013 0400   CL 105 05/20/2013 1459   CL 106 02/10/2013 0400   CO2 25 08/12/2013 1359   CO2 26 02/10/2013 0400   BUN 25.0 08/12/2013 1359   BUN 11 02/10/2013 0400   CREATININE 1.1 08/12/2013 1359   CREATININE 0.84 02/10/2013 0400      Component Value Date/Time   CALCIUM 9.2 08/12/2013 1359   CALCIUM 8.7 02/10/2013 0400   ALKPHOS 73 08/12/2013 1359   ALKPHOS 66 01/28/2013 1630   AST 24 08/12/2013 1359   AST 36 01/28/2013 1630   ALT 27 08/12/2013 1359   ALT 55* 01/28/2013 1630   BILITOT 0.27 08/12/2013 1359  BILITOT 0.2* 01/28/2013 1630      Lab Results  Component Value Date   LABCA2 16 07/19/2012     RADIOGRAPHIC STUDIES: No results found.  ASSESSMENT: 67 y.o. [place], [place] woman with:  1.  Inflammatory right breast cancer with positive lymph nodes the  tumor is ER negative PR negative HER-2/neu positive.  Patient is s/p neoadjuvant chemotherapy consisting of FEC q 2 weeks x 4 cycles.  Status post weekly Gemcitabine and Herceptin that was started on 11/19/12 and completed on 12/17/2012 after 5 cycles.   Herceptin Qweekly x 12 weeks started on 01/14/2013.  Patient underwent right mastectomy by Dr. Dwain Sarna on 02/08/13 with complete pathologic response to neoadjuvant chemotherapy. Adjuvant Herceptin every 3 weeks started on 02/25/13, and radiation therapy with Dr. Michell Heinrich began on 03/31/13 through 05/17/13.   2.  Bilateral lower extremity DVT and Bilateral PE.  The patient has a positive Prothrombin Gene Mutation, heterozygous. S/p IVC filter inserted on  12/27/2012. She will continue on Coumadin and take 7.5 mg five nights per week and 5mg  two nights per week. Her INR is stable  3.  Lower extremity edema-near resolved today.   PLAN:  1. Doing well. Patient will proceed with Herceptin therapy today.  Her last echo and Dr. Gala Romney appointment was on 06/20/13.  She was cleared to continue herceptin.    2. I prescribed Keflex for acute paronychia.    3.  She will go to her dermatologist for follow up of her hives.  I instructed her to continue benadryl and zyrtec daily.  After seeing dermatology, she will follow up with her allergist afterwards due to her longstanding history of allergies.    4. Ms. Straley will return in 3 weeks for her next herceptin appointment.  All questions were answered.  The patient was encouraged to contact us in the interim with any problems, questions or concerns.   I spent 25 minutes counseling the patient face to face.  The total time spent in the appointment was 30 minutes.  Cherie Ouch Lyn Hollingshead, NP Medical Oncology [facility] Phone: [phone]

## 2013-08-12 NOTE — Patient Instructions (Addendum)
[facility] Discharge Instructions for Patients Receiving Chemotherapy  Today you received the following chemotherapy agents: Herceptin   To help prevent nausea and vomiting after your treatment, we encourage you to take your nausea medication as directed by your physician.   If you develop nausea and vomiting that is not controlled by your nausea medication, call the clinic.   BELOW ARE SYMPTOMS THAT SHOULD BE REPORTED IMMEDIATELY:  *FEVER GREATER THAN 100.5 F  *CHILLS WITH OR WITHOUT FEVER  NAUSEA AND VOMITING THAT IS NOT CONTROLLED WITH YOUR NAUSEA MEDICATION  *UNUSUAL SHORTNESS OF BREATH  *UNUSUAL BRUISING OR BLEEDING  TENDERNESS IN MOUTH AND THROAT WITH OR WITHOUT PRESENCE OF ULCERS  *URINARY PROBLEMS  *BOWEL PROBLEMS  UNUSUAL RASH Items with * indicate a potential emergency and should be followed up as soon as possible.  Feel free to call the clinic you have any questions or concerns. The clinic phone number is [phone].    

## 2013-08-13 LAB — IGG, IGA, IGM: IgM, Serum: 69 mg/dL (ref 52–322)

## 2013-08-15 ENCOUNTER — Telehealth: Payer: Self-pay | Admitting: *Deleted

## 2013-08-15 ENCOUNTER — Telehealth: Payer: Self-pay | Admitting: Oncology

## 2013-08-15 DIAGNOSIS — L259 Unspecified contact dermatitis, unspecified cause: Secondary | ICD-10-CM | POA: Diagnosis not present

## 2013-08-15 LAB — IRON AND TIBC CHCC
%SAT: 25 % (ref 21–57)
Iron: 74 ug/dL (ref 41–142)
UIBC: 228 ug/dL (ref 120–384)

## 2013-08-15 LAB — FERRITIN CHCC: Ferritin: 69 ng/ml (ref 9–269)

## 2013-08-15 NOTE — Telephone Encounter (Signed)
Per staff message and POF I have scheduled appts.  JMW  

## 2013-08-22 ENCOUNTER — Encounter: Payer: Self-pay | Admitting: Adult Health

## 2013-08-24 ENCOUNTER — Telehealth: Payer: Self-pay | Admitting: Medical Oncology

## 2013-08-24 NOTE — Telephone Encounter (Signed)
Per NP, spoke with Meriam Sprague at [facility] office Orthopaedic surgery, for patients paronychia to right thumb, for referral. Per Meriam Sprague patient with appt tomorrow (09/25)  to see Dr Mina Marble @ 10:20.

## 2013-08-25 DIAGNOSIS — L03019 Cellulitis of unspecified finger: Secondary | ICD-10-CM | POA: Diagnosis not present

## 2013-09-01 DIAGNOSIS — L03019 Cellulitis of unspecified finger: Secondary | ICD-10-CM | POA: Diagnosis not present

## 2013-09-02 ENCOUNTER — Encounter: Payer: Self-pay | Admitting: Internal Medicine

## 2013-09-02 ENCOUNTER — Ambulatory Visit (HOSPITAL_BASED_OUTPATIENT_CLINIC_OR_DEPARTMENT_OTHER): Payer: Medicare Other

## 2013-09-02 ENCOUNTER — Other Ambulatory Visit: Payer: Self-pay | Admitting: *Deleted

## 2013-09-02 ENCOUNTER — Other Ambulatory Visit: Payer: Medicare Other | Admitting: Lab

## 2013-09-02 ENCOUNTER — Ambulatory Visit: Payer: Medicare Other | Admitting: Oncology

## 2013-09-02 ENCOUNTER — Ambulatory Visit (HOSPITAL_BASED_OUTPATIENT_CLINIC_OR_DEPARTMENT_OTHER): Payer: Medicare Other | Admitting: Adult Health

## 2013-09-02 ENCOUNTER — Ambulatory Visit (INDEPENDENT_AMBULATORY_CARE_PROVIDER_SITE_OTHER): Payer: Medicare Other | Admitting: Internal Medicine

## 2013-09-02 ENCOUNTER — Other Ambulatory Visit (HOSPITAL_BASED_OUTPATIENT_CLINIC_OR_DEPARTMENT_OTHER): Payer: Medicare Other

## 2013-09-02 ENCOUNTER — Encounter: Payer: Self-pay | Admitting: Adult Health

## 2013-09-02 VITALS — BP 138/83 | HR 85 | Temp 98.3°F | Resp 20 | Ht 60.5 in | Wt 148.8 lb

## 2013-09-02 VITALS — BP 138/70 | HR 75 | Temp 98.6°F | Ht 60.5 in | Wt 148.0 lb

## 2013-09-02 DIAGNOSIS — I82403 Acute embolism and thrombosis of unspecified deep veins of lower extremity, bilateral: Secondary | ICD-10-CM

## 2013-09-02 DIAGNOSIS — Z5112 Encounter for antineoplastic immunotherapy: Secondary | ICD-10-CM | POA: Diagnosis not present

## 2013-09-02 DIAGNOSIS — C50111 Malignant neoplasm of central portion of right female breast: Secondary | ICD-10-CM

## 2013-09-02 DIAGNOSIS — C773 Secondary and unspecified malignant neoplasm of axilla and upper limb lymph nodes: Secondary | ICD-10-CM

## 2013-09-02 DIAGNOSIS — C50919 Malignant neoplasm of unspecified site of unspecified female breast: Secondary | ICD-10-CM

## 2013-09-02 DIAGNOSIS — I2699 Other pulmonary embolism without acute cor pulmonale: Secondary | ICD-10-CM

## 2013-09-02 DIAGNOSIS — Z171 Estrogen receptor negative status [ER-]: Secondary | ICD-10-CM | POA: Diagnosis not present

## 2013-09-02 DIAGNOSIS — C50112 Malignant neoplasm of central portion of left female breast: Secondary | ICD-10-CM

## 2013-09-02 DIAGNOSIS — I82409 Acute embolism and thrombosis of unspecified deep veins of unspecified lower extremity: Secondary | ICD-10-CM | POA: Diagnosis not present

## 2013-09-02 DIAGNOSIS — I82509 Chronic embolism and thrombosis of unspecified deep veins of unspecified lower extremity: Secondary | ICD-10-CM | POA: Diagnosis not present

## 2013-09-02 LAB — CBC WITH DIFFERENTIAL/PLATELET
Basophils Absolute: 0 10*3/uL (ref 0.0–0.1)
Eosinophils Absolute: 0.2 10*3/uL (ref 0.0–0.5)
HCT: 33.8 % — ABNORMAL LOW (ref 34.8–46.6)
HGB: 11.6 g/dL (ref 11.6–15.9)
LYMPH%: 19.4 % (ref 14.0–49.7)
MONO#: 0.5 10*3/uL (ref 0.1–0.9)
NEUT#: 5 10*3/uL (ref 1.5–6.5)
NEUT%: 70.8 % (ref 38.4–76.8)
Platelets: 193 10*3/uL (ref 145–400)
WBC: 7.1 10*3/uL (ref 3.9–10.3)
lymph#: 1.4 10*3/uL (ref 0.9–3.3)

## 2013-09-02 LAB — PROTIME-INR
INR: 2.7 (ref 2.00–3.50)
Protime: 32.4 Seconds — ABNORMAL HIGH (ref 10.6–13.4)

## 2013-09-02 LAB — COMPREHENSIVE METABOLIC PANEL (CC13)
ALT: 24 U/L (ref 0–55)
CO2: 26 mEq/L (ref 22–29)
Calcium: 9.2 mg/dL (ref 8.4–10.4)
Chloride: 107 mEq/L (ref 98–109)
Creatinine: 1.2 mg/dL — ABNORMAL HIGH (ref 0.6–1.1)
Glucose: 140 mg/dl (ref 70–140)
Total Bilirubin: 0.25 mg/dL (ref 0.20–1.20)

## 2013-09-02 MED ORDER — ACETAMINOPHEN 325 MG PO TABS
ORAL_TABLET | ORAL | Status: AC
  Filled 2013-09-02: qty 2

## 2013-09-02 MED ORDER — SODIUM CHLORIDE 0.9 % IV SOLN
Freq: Once | INTRAVENOUS | Status: AC
  Administered 2013-09-02: 16:00:00 via INTRAVENOUS

## 2013-09-02 MED ORDER — HEPARIN SOD (PORK) LOCK FLUSH 100 UNIT/ML IV SOLN
500.0000 [IU] | Freq: Once | INTRAVENOUS | Status: DC | PRN
  Filled 2013-09-02: qty 5

## 2013-09-02 MED ORDER — ACETAMINOPHEN 325 MG PO TABS
650.0000 mg | ORAL_TABLET | Freq: Once | ORAL | Status: AC
  Administered 2013-09-02: 650 mg via ORAL

## 2013-09-02 MED ORDER — DIPHENHYDRAMINE HCL 25 MG PO CAPS: 50.0000 mg | ORAL_CAPSULE | Freq: Once | ORAL | Status: DC

## 2013-09-02 MED ORDER — SODIUM CHLORIDE 0.9 % IJ SOLN
10.0000 mL | INTRAMUSCULAR | Status: DC | PRN
  Filled 2013-09-02: qty 10

## 2013-09-02 MED ORDER — TRASTUZUMAB CHEMO INJECTION 440 MG
6.0000 mg/kg | Freq: Once | INTRAVENOUS | Status: AC
  Administered 2013-09-02: 378 mg via INTRAVENOUS
  Filled 2013-09-02: qty 18

## 2013-09-02 NOTE — Progress Notes (Signed)
Subjective:    Patient ID: Jessica Wells, female    DOB: 1946-07-04.   MRN: [id number]   Brief patient profile:  67 yo female , former smoker-quit 1970.  Admitted by [facility] 12/27/12 for acute resp failure /hypoxia secondary to DVT, PE and HCAP.  Pt w/ recently  Dx right inflammatory breast cancer(dx 08/2012)  undergoing active tx   01/21/2013 [facility] follow up  Pt was admitted 12/27/2012 with acute respiratory failure with hypoxia, secondary to bilateral DVT(dx 1/22), subsequent pulmonary emboli, and ? healthcare associated pneumonia-bilateral pulm infiltrates . Patient has recently been diagnosed with inflammatory breast cancer. October 2013, and was undergoing active treatment. She has a planned mastectomy on 3/5 . Patient had a three-day history of progressive shortness, of breath., On admission. CT chest showed bilateral pulmonary embolism, right greater than left. Significant diffuse, bilateral infiltrates on CT.2-D echo showed an ejection fracture of 60-65%. Coag panel was positive for prothrombin gene mutation-heterozygous. IVC was placed. Patient was started on Lovenox injections. She did require intubation w/ vent support 1/30-2/2. She was treated with aggressive antibiotics, pulmonary  Hygiene.She was given steroid challenge for bilateral pulm infiltrates.  CXR improved prior to discharge w/ near complete resolution of bilateral aspdz .   Since discharge. Patient reports that she is feeling much improved with decreased shortness of breath. She's been only using her oxygen as needed. Inventory walk in the office without notable desaturations.   09/02/2013 f/u ov/Avigdor Dollar re:  Chief Complaint  Patient presents with  . Follow-up    Pt states that her breathing is doing well and she denies any co's today.     Not limited by breathing/ no leg swelling / no RV abn on previous echo's  No  chronic cough or cp or chest tightness, subjective wheeze overt sinus or hb symptoms. No unusual exp  hx or h/o childhood pna/ asthma or knowledge of premature birth.  Sleeping ok without nocturnal  or early am exacerbation  of respiratory  c/o's or need for noct saba. Also denies any obvious fluctuation of symptoms with weather or environmental changes or other aggravating or alleviating factors except as outlined above   Current Medications, Allergies, Complete Past Medical History, Past Surgical History, Family History, and Social History were reviewed in [employer] record.  ROS  The following are not active complaints unless bolded sore throat, dysphagia, dental problems, itching, sneezing,  nasal congestion or excess/ purulent secretions, ear ache,   fever, chills, sweats, unintended wt loss, pleuritic or exertional cp, hemoptysis,  orthopnea pnd or leg swelling, presyncope, palpitations, heartburn, abdominal pain, anorexia, nausea, vomiting, diarrhea  or change in bowel or urinary habits, change in stools or urine, dysuria,hematuria,  rash, arthralgias, visual complaints, headache, numbness weakness or ataxia or problems with walking or coordination,  change in mood/affect or memory.          SH:  Worked in [employer]  Retired.  Former smoker             Objective:   Physical Exam  Wt Readings from Last 3 Encounters:  09/02/13 148 lb (67.132 kg)  08/12/13 146 lb 8 oz (66.452 kg)  07/22/13 147 lb 5 oz (66.821 kg)       GEN: A/Ox3; pleasant , NAD, well nourished   HEENT:  [place]/AT,  EACs-clear, TMs-wnl, NOSE-clear, THROAT-clear, no lesions, no postnasal drip or exudate noted.   NECK:  Supple w/ fair ROM; no JVD; normal carotid impulses w/o bruits; no thyromegaly or  nodules palpated; no lymphadenopathy.  RESP  Clear  P & A; w/o, wheezes/ rales/ or rhonchi.no accessory muscle use, no dullness to percussion  CARD:  RRR, no m/r/g,  pulses intact, no cyanosis or clubbing no edema  GI:   Soft & nt; nml bowel sounds; no organomegaly or masses  detected.  Musco: Warm bil, no deformities or joint swelling noted.   Neuro: alert, no focal deficits noted.    Skin: Warm, no lesions or rashes   Venous Dopplers : 1/22  Findings consistent with acute deep vein thrombosis involving the posterior tibial and popliteal veins of the right lower extremity - Findings consistent with acute deep vein thrombosis involving the proximal popliteal. femoral and common femoral veins of the left lower extremity - No evidence of superficial thrombosis involving the right lower extremity - There is a superficial thrombus of the proximal greater saphenous vein coursing into and including the saphenofemoral junction of the left lower extremity   CT chest 12/27/12   Examination is positive for bilateral pulmonary embolism, right  greater than left. Overall clot burden is felt to be small to  moderate. No CT evidence of right-sided heart failure.  2. Interval development of extensive bilateral heterogeneous  opacities with slight upper lobe predominance. Differential  considerations are broad and include drug toxicity, though note,  atypical infection may have a similar appearance.  3. Interval development of mediastinal bilateral hilar adenopathy,  again nonspecific and while possibly reactive, metastatic disease  is not excluded.  CXR 01/21/13  Near resolution of pulmonary infiltrates     Assessment & Plan:

## 2013-09-02 NOTE — Progress Notes (Signed)
[facility]  Telephone:([phone] [phone]) [phone]  OFFICE PROGRESS NOTE   PATIENT: Jessica Wells   DOB: [birth year]  MR#: [id number]  AOZ#:[id number]   NG:EXBMWU,XLKGMWNUU STEWART, MD Lurline Hare, MD Emelia Loron, MD Arvilla Meres, MD Charlaine Dalton. Sherene Sires, MD    DIAGNOSIS:  67 year-old [place], [place] woman with a recent diagnosis of inflammatory breast cancer.   PRIOR THERAPY: 1.  The patient was originally seen in clinic for new diagnosis of inflammatory breast cancer, she was referred by Dr. Emelia Loron. She had a mammogram performed on 07/14/2012 that showed an abnormality. That was biopsied and it showed an invasive mammary carcinoma with lymphovascular invasion grade 3 ER negative PR negative HER-2/neu positive.   2.  The patient had a MRI of the breasts performed on 07/19/2012.  The MRI showed diffuse right breast neoplasm on a large level I right axillary lymph nodes compatible with lymphatic spread.  The patient has had a biopsy of the right axillary lymph node that is compatible with invasive mammary carcinoma.   3.  The patient began neoadjuvant FEC 100 with day 2 Neulasta support on 07/23/2012.   4.   Weekly neoadjuvant Taxol and Herceptin started on 09/17/12.  The patient developed neuropathies, and Taxol was discontinued early.  She completed 7 weeks of Taxol/Herceptin combination therapy.  The patient then received 2 weeks of Herceptin only.   5. Weekly Gemcitabine and Herceptin was started on 11/19/12 and completed on 12/17/2012 after 5 cycles.  6.  A regimen of Lasix 20 mg PO and Kdur 10 mEq was started on 01/07/2013 due to chronic lower extremity edema.  Medications are only to be taken if patient has a weight gain of 5 lbs or more in a day (daily weights).   7.  Anemia was treated with blood transfusions of 1 unit of PRBCs given on 01/11/2013 and another unit of PRBC's given 01/12/2013.  8. Patient underwent right mastectomy on  02/08/13.  Pathology showed a complete response to chemotherapy in the breast and 11 nodes identified with fibrosis but no carcinoma.    9.  Radiation therapy from 03/31/13 through 05/17/13.  10.  Every 3 week herceptin started 02/25/13.    CURRENT THERAPY:  Herceptin every 3 weeks  INTERVAL HISTORY: Jessica Wells is doing well today.  She is here for evaluation prior to Herceptin.  She is doing well today.  She had acute paronychia around her right thumb nail at her last appointment that I treated with Keflex, and it worsened.  I sent her to Dr. Mina Marble and he gave her one week of conservative treatment, and then determined she needed to go to the operating room to have it cleaned out.  Otherwise, she is doing well and a 10 point ROS is neg.    PAST MEDICAL HISTORY: Past Medical History  Diagnosis Date  . GERD (gastroesophageal reflux disease)   . Dysrhythmia     PAT-sees dr Laurence Compton meds  . Breast cancer 07/14/12    inflammatory right breast ca, ER/PR -  . Allergy   . PAT (paroxysmal atrial tachycardia)     hx  . Arthritis     osteopenia,knees  . PONV (postoperative nausea and vomiting) 09-13-12    severe, with Port-a-cath, was managed without PONV  . Clotting disorder     prothrombin gene mutation-heterozygous   . History of chemotherapy     Last dose to be 02-04-13.  Was rx'd with Herceptin & Gemzar  . DVT (deep  venous thrombosis)   . Asthma     Uses Inhalers Proventil as needed  . Bursitis 01-25-13    Rt. shoulder- mildly affected now.  . History of blood transfusion 01-25-13    2 units 01-11-13  . Pneumonia 01-25-13    hx. 12-27-12-hospital stay x 9 days, now resolved.  . S/P radiation therapy 4-12 wks ago 03/31/13-05/17/13    right breast/supraclavicular fossa/posterior axillary boost    PAST SURGICAL HISTORY: Past Surgical History  Procedure Laterality Date  . Dilation and curettage of uterus      x3  . Tonsillectomy    . Colonoscopy    . Portacath placement  07/21/2012     Procedure: INSERTION PORT-A-CATH;  Surgeon: Emelia Loron, MD;  Location: [facility];  Service: General;  Laterality: Left;/ Replacement done 10'13  . Insertion of vena cava filter  12/2012  . Giant cell tumor  01-25-13    removed Rt. forearm.  . Eye lid surgery      right( [facility] office)  . Mastectomy modified radical Right 02/08/2013    Procedure: MASTECTOMY MODIFIED RADICAL;  Surgeon: Emelia Loron, MD;  Location: [facility];  Service: General;  Laterality: Right;  . Breast surgery      FAMILY HISTORY: Family History  Problem Relation Age of Onset  . Diabetes Mother   . Heart disease Mother   . Hypertension Mother   . Hyperlipidemia Mother   . Breast cancer Mother 72    lobular breast cancer  . Heart disease Maternal Aunt   . Heart disease Maternal Uncle   . Heart disease Maternal Grandmother     died in her 30s from heart disease  . Lung cancer Father 46    adenocarcinoma  . Cancer Paternal Uncle     dx in mid 70s with a cancer in the leg, died late 45s; grandpaternal half uncle    SOCIAL HISTORY: History  Substance Use Topics  . Smoking status: Former Smoker -- 1.00 packs/day for 35 years    Types: Cigarettes    Quit date: 07/21/1979  . Smokeless tobacco: Never Used  . Alcohol Use: No    ALLERGIES: Allergies  Allergen Reactions  . Anesthetics, Amide Nausea And Vomiting    Projectile vomitting-Nausea- with 24hrs of dry heaves. With any anesthetics  . Bactrim [Sulfamethoxazole-Trimethoprim] Other (See Comments)    Massive diarrhea, nausea vomiting, platelets dropped, hemoglobin dropped, admitted to hospital  . Sulfa Antibiotics Nausea And Vomiting and Other (See Comments)    Crazy feeling in head Life threatening reaction, decreased blood counts  . Codeine Nausea And Vomiting  . Codeine Phosphate Nausea And Vomiting  . Eggs Or Egg-Derived Products Hives     MEDICATIONS:  Current Outpatient Prescriptions  Medication Sig Dispense Refill  . b  complex vitamins tablet Take 1 tablet by mouth daily.      . calcium-vitamin D (OSCAL WITH D) 500-200 MG-UNIT per tablet Take 1 tablet by mouth every other day.       . Ferrous Sulfate (SLOW FE PO) Take 1 tablet by mouth every other day.      . fish oil-omega-3 fatty acids 1000 MG capsule Take 2 g by mouth daily.      . Flaxseed, Linseed, (FLAX SEEDS PO) Take 1 tablet by mouth daily.      Marland Kitchen glucosamine-chondroitin 500-400 MG tablet Take 1 tablet by mouth daily.      . metoprolol succinate (TOPROL-XL) 50 MG 24 hr tablet Take 50 mg by  mouth daily before breakfast. Take with or immediately following a meal.      . Multiple Vitamin (MULTIVITAMIN WITH MINERALS) TABS Take 1 tablet by mouth every other day.       . non-metallic deodorant Thornton Papas) MISC Apply 1 application topically daily as needed.      Marland Kitchen omeprazole (PRILOSEC) 20 MG capsule Take 20 mg by mouth daily. For heartburn      . PROVENTIL HFA 108 (90 BASE) MCG/ACT inhaler Inhale 2 puffs into the lungs every 4 (four) hours as needed.       . sodium chloride 0.9 % SOLN 250 mL with trastuzumab 440 MG SOLR 2 mg/kg Inject 2 mg/kg into the vein every 21 ( twenty-one) days.      Marland Kitchen warfarin (COUMADIN) 5 MG tablet Take one 5 mg tablet by mouth on Tuesday and Friday and 7.5 mg all other days or as directed by physician.  60 tablet  1   No current facility-administered medications for this visit.      REVIEW OF SYSTEMS: A 10 point review of systems was completed and is negative except as noted above.    PHYSICAL EXAMINATION: BP 138/83  Pulse 85  Temp(Src) 98.3 F (36.8 C) (Oral)  Resp 20  Ht 5' 0.5" (1.537 m)  Wt 148 lb 12.8 oz (67.495 kg)  BMI 28.57 kg/m2    General: Patient is a well appearing female in no acute distress HEENT: PERRLA, sclerae anicteric no conjunctival pallor, MMM Neck: supple, no palpable adenopathy Lungs: clear to auscultation bilaterally, no wheezes, rhonchi, or rales Cardiovascular: regular rate rhythm, S1, S2, no  murmurs, rubs or gallops Abdomen: Soft, non-tender, non-distended, normoactive bowel sounds, no HSM Extremities: Extremities normal, atraumatic, no cyanosis, scant bilateral lower extremity edema, Right thumb erythema and swelling and warmth surrounding the nail bed.  It is more erythematous and swollen today.   Skin: No rashes or lesions.   Breasts: right mastectomy site with no nodularity or sign of recurrence, left breast no masses or nodules ECOG FS:  Grade 1  LAB RESULTS: Lab Results  Component Value Date   WBC 7.1 09/02/2013   NEUTROABS 5.0 09/02/2013   HGB 11.6 09/02/2013   HCT 33.8* 09/02/2013   MCV 87.3 09/02/2013   PLT 193 09/02/2013      Chemistry      Component Value Date/Time   NA 142 08/12/2013 1359   NA 140 02/10/2013 0400   K 3.7 08/12/2013 1359   K 3.4* 02/10/2013 0400   CL 105 05/20/2013 1459   CL 106 02/10/2013 0400   CO2 25 08/12/2013 1359   CO2 26 02/10/2013 0400   BUN 25.0 08/12/2013 1359   BUN 11 02/10/2013 0400   CREATININE 1.1 08/12/2013 1359   CREATININE 0.84 02/10/2013 0400      Component Value Date/Time   CALCIUM 9.2 08/12/2013 1359   CALCIUM 8.7 02/10/2013 0400   ALKPHOS 73 08/12/2013 1359   ALKPHOS 66 01/28/2013 1630   AST 24 08/12/2013 1359   AST 36 01/28/2013 1630   ALT 27 08/12/2013 1359   ALT 55* 01/28/2013 1630   BILITOT 0.27 08/12/2013 1359   BILITOT 0.2* 01/28/2013 1630      Lab Results  Component Value Date   LABCA2 16 07/19/2012     RADIOGRAPHIC STUDIES: No results found.  ASSESSMENT: 67 y.o. Jessica Wells, [place] woman with:  1.  Inflammatory right breast cancer with positive lymph nodes the tumor is ER negative PR negative HER-2/neu positive.  Patient  is s/p neoadjuvant chemotherapy consisting of FEC q 2 weeks x 4 cycles.  Status post weekly Gemcitabine and Herceptin that was started on 11/19/12 and completed on 12/17/2012 after 5 cycles.   Herceptin Qweekly x 12 weeks started on 01/14/2013.  Patient underwent right mastectomy by Dr. Dwain Sarna on 02/08/13  with complete pathologic response to neoadjuvant chemotherapy. Adjuvant Herceptin every 3 weeks started on 02/25/13, and radiation therapy with Dr. Michell Heinrich was received from 03/31/13 through 05/17/13.   2.  Bilateral lower extremity DVT and Bilateral PE.  The patient has a positive Prothrombin Gene Mutation, heterozygous. S/p IVC filter inserted on  12/27/2012. She will continue on Coumadin and take 7.5 mg five nights per week and 5mg  two nights per week. Her INR is stable.    3.  Lower extremity edema-near resolved today.  4.  Right thumb acute paronychia: She has been evaluated by Dr. Mina Marble and he has recommended surgery to clean out the infection.  She will need to stop Coumadin and be bridged on Lovenox during this time.    PLAN:  1. Doing well. Patient will proceed with Herceptin therapy today.  Her last echo and Dr. Gala Romney appointment was on 06/20/13.  She was cleared to continue herceptin.  Her last dose will be in 3 weeks.  She will have her final Dr. Gala Romney appt around then also.    2. She will have to undergo debridement of her right thumb nail acute paronychia.  She is on Coumadin.  She would like for [place] to call Dr. Ronie Spies office to discuss her Coumadin and antibiotic therapy after surgery.    All questions were answered.  The patient was encouraged to contact us in the interim with any problems, questions or concerns.   I spent 25 minutes counseling the patient face to face.  The total time spent in the appointment was 30 minutes.  Cherie Ouch Lyn Hollingshead, NP Medical Oncology [facility] Phone: [phone]

## 2013-09-02 NOTE — Assessment & Plan Note (Signed)
Echo  01/20/13 > no RV strain or PAH Coag panel was positive for prothrombin gene mutation-heterozygous. IVC was placed.  Bilateral DVT and PE on CT (while receiving active tx for inflammatory breast cancer)  >tx w/ lovenox and IVC placed 12/27/12   Agree with indefinite coumadin or some other equivalent longterm anticoagulant since has prothrombin gene mutation, IVC filter and h/o breast ca   Discussed the relevance of the nl echo and  Clinical complete recovery from PE at this point and pulmonary f/u can be prn

## 2013-09-02 NOTE — Patient Instructions (Addendum)
No need for regular pulmonary follow up at this point - call if needed

## 2013-09-07 ENCOUNTER — Encounter: Payer: Self-pay | Admitting: Adult Health

## 2013-09-09 ENCOUNTER — Telehealth: Payer: Self-pay | Admitting: Adult Health

## 2013-09-09 NOTE — Telephone Encounter (Signed)
Spoke with patient regarding our recommendation of lovenox bridge and patient to come off of coumadin prior to any invasive surgical procedure on thumb (surgery for acute paronychia non-responsive to conservative measures).  Currently patient states that thumb is improving and she will watch it over the weekend prior to calling Dr. Ronie Spies office.  If she does decide to proceed with surgical intervention, she will call us and let us know so we can stop coumadin prior, put on Lovenox and re-check coagulation studies.  Patient in agreement with plan and will call us on Monday.  Cherie Ouch Lyn Hollingshead, NP Medical Oncology [facility] Phone: [phone]

## 2013-09-16 ENCOUNTER — Other Ambulatory Visit (HOSPITAL_COMMUNITY): Payer: Self-pay

## 2013-09-16 DIAGNOSIS — I471 Supraventricular tachycardia: Secondary | ICD-10-CM

## 2013-09-16 MED ORDER — METOPROLOL SUCCINATE ER 50 MG PO TB24: 50.0000 mg | ORAL_TABLET | Freq: Every day | ORAL | Status: DC

## 2013-09-16 NOTE — Telephone Encounter (Signed)
Prescription refill sent to [facility] for Metoprolol 50 mg ER tab

## 2013-09-21 ENCOUNTER — Ambulatory Visit (HOSPITAL_COMMUNITY)
Admission: RE | Admit: 2013-09-21 | Discharge: 2013-09-21 | Disposition: A | Discharge status: Home / Self Care | Payer: Medicare Other | Source: Ambulatory Visit | Attending: Adult Health | Admitting: Adult Health

## 2013-09-21 ENCOUNTER — Ambulatory Visit (HOSPITAL_BASED_OUTPATIENT_CLINIC_OR_DEPARTMENT_OTHER)
Admission: RE | Admit: 2013-09-21 | Discharge: 2013-09-21 | Disposition: A | Discharge status: Home / Self Care | Payer: Medicare Other | Source: Ambulatory Visit | Attending: Internal Medicine | Admitting: Internal Medicine

## 2013-09-21 VITALS — BP 138/76 | HR 67 | Wt 150.0 lb

## 2013-09-21 DIAGNOSIS — C50919 Malignant neoplasm of unspecified site of unspecified female breast: Secondary | ICD-10-CM | POA: Insufficient documentation

## 2013-09-21 DIAGNOSIS — I2699 Other pulmonary embolism without acute cor pulmonale: Secondary | ICD-10-CM | POA: Diagnosis not present

## 2013-09-21 DIAGNOSIS — K219 Gastro-esophageal reflux disease without esophagitis: Secondary | ICD-10-CM | POA: Insufficient documentation

## 2013-09-21 DIAGNOSIS — R0603 Acute respiratory distress: Secondary | ICD-10-CM

## 2013-09-21 DIAGNOSIS — C50111 Malignant neoplasm of central portion of right female breast: Secondary | ICD-10-CM

## 2013-09-21 DIAGNOSIS — J984 Other disorders of lung: Secondary | ICD-10-CM | POA: Diagnosis not present

## 2013-09-21 DIAGNOSIS — R000 Tachycardia, unspecified: Secondary | ICD-10-CM | POA: Insufficient documentation

## 2013-09-21 DIAGNOSIS — I471 Supraventricular tachycardia: Secondary | ICD-10-CM

## 2013-09-21 DIAGNOSIS — J9601 Acute respiratory failure with hypoxia: Secondary | ICD-10-CM

## 2013-09-21 DIAGNOSIS — Z09 Encounter for follow-up examination after completed treatment for conditions other than malignant neoplasm: Secondary | ICD-10-CM

## 2013-09-21 DIAGNOSIS — I499 Cardiac arrhythmia, unspecified: Secondary | ICD-10-CM

## 2013-09-21 NOTE — Addendum Note (Signed)
Encounter addended by: Theresia Bough, CMA on: 09/21/2013  9:35 AM<BR>     Documentation filed: Patient Instructions Section

## 2013-09-21 NOTE — Progress Notes (Signed)
Oncologist: Dr Welton Flakes-  General Surgeon: Dr Dwain Sarna   HPI:   67 year old female with a history of tachycardia, inflammatory right breast cancer with positive lymph nodes the tumor is ER negative PR negative HER-2/neu positive. She has completed FEC for 4 cycles then taxol and herceptin weekly for 12 weeks. She continues on herceptin q3 weeks. S/p right radical mastectomy 02/08/2013.  Admitted to [facility] 12/27/12 with increased dyspnea. Respiratory Failure. Bilateral pulmonary embolism and pulmonary infiltrates. Bilateral lower extremity DVT. [place] filter placed while hospitalized. Discharged from [facility] 01/05/13.   ECHOS:  07/22/12 EF 60-65% lateral s' 10.5  10/2012 EF 60-65% lateral s' 10.7  01/19/13 EF 55-60% lateral S' 13.0 No evidence of RV strain.  03/21/13 EF 55-60% lateral s' 9.8 (remeasured by me today) Global Strain -16  06/20/13 EF 55-60% Lateral S' 9.3 Global Strain -24  09/21/13 EF 60% Lateral s' 9.9 Global strain -19%  Follow up: Doing well with Herceptin. Finishes this Friday. Stays active but doesn't exercise formally. No DOE/edema or other problems.    ROS: All systems negative except as listed in HPI, PMH and Problem List.  Past Medical History  Diagnosis Date  . GERD (gastroesophageal reflux disease)   . Dysrhythmia     PAT-sees dr Laurence Compton meds  . Breast cancer 07/14/12    inflammatory right breast ca, ER/PR -  . Allergy   . PAT (paroxysmal atrial tachycardia)     hx  . Arthritis     osteopenia,knees  . PONV (postoperative nausea and vomiting) 09-13-12    severe, with Port-a-cath, was managed without PONV  . Clotting disorder     prothrombin gene mutation-heterozygous   . History of chemotherapy     Last dose to be 02-04-13.  Was rx'd with Herceptin & Gemzar  . DVT (deep venous thrombosis)   . Asthma     Uses Inhalers Proventil as needed  . Bursitis 01-25-13    Rt. shoulder- mildly affected now.  . History of blood transfusion 01-25-13    2 units 01-11-13  . Pneumonia  01-25-13    hx. 12-27-12-hospital stay x 9 days, now resolved.  . S/P radiation therapy 4-12 wks ago 03/31/13-05/17/13    right breast/supraclavicular fossa/posterior axillary boost    Current Outpatient Prescriptions  Medication Sig Dispense Refill  . b complex vitamins tablet Take 1 tablet by mouth daily.      . calcium-vitamin D (OSCAL WITH D) 500-200 MG-UNIT per tablet Take 1 tablet by mouth every other day.       . Ferrous Sulfate (SLOW FE PO) Take 1 tablet by mouth every other day.      . fish oil-omega-3 fatty acids 1000 MG capsule Take 2 g by mouth daily.      . Flaxseed, Linseed, (FLAX SEEDS PO) Take 1 tablet by mouth daily.      Marland Kitchen glucosamine-chondroitin 500-400 MG tablet Take 1 tablet by mouth daily.      . metoprolol succinate (TOPROL-XL) 50 MG 24 hr tablet Take 1 tablet (50 mg total) by mouth daily before breakfast. Take with or immediately following a meal.  30 tablet  3  . Multiple Vitamin (MULTIVITAMIN WITH MINERALS) TABS Take 1 tablet by mouth every other day.       . non-metallic deodorant Thornton Papas) MISC Apply 1 application topically daily as needed.      Marland Kitchen omeprazole (PRILOSEC) 20 MG capsule Take 20 mg by mouth daily. For heartburn      . PROVENTIL HFA 108 (90  BASE) MCG/ACT inhaler Inhale 2 puffs into the lungs every 4 (four) hours as needed.       . sodium chloride 0.9 % SOLN 250 mL with trastuzumab 440 MG SOLR 2 mg/kg Inject 2 mg/kg into the vein every 21 ( twenty-one) days.      Marland Kitchen warfarin (COUMADIN) 5 MG tablet Take one 5 mg tablet by mouth on Tuesday and Friday and 7.5 mg all other days or as directed by physician.  60 tablet  1   No current facility-administered medications for this encounter.    Filed Vitals:   09/21/13 0853  BP: 138/76  Pulse: 67  Weight: 150 lb (68.04 kg)  SpO2: 97%    PHYSICAL EXAM: General:  Well appearing. No resp difficulty HEENT: normal Neck: supple. JVP flat. Carotids 2+ bilaterally; no bruits. No lymphadenopathy or thryomegaly  appreciated. Cor: PMI normal. Regular rate & rhythm. No rubs, gallops or murmurs. Lungs: clear Abdomen: soft, nontender, nondistended. No hepatosplenomegaly. No bruits or masses. Good bowel sounds. Extremities: no cyanosis, clubbing, rash, edema Neuro: alert & orientedx3, cranial nerves grossly intact. Moves all 4 extremities w/o difficulty. Affect pleasant.   ASSESSMENT & PLAN:  1. Breast cancer: I reviewed echos personally. EF and Doppler parameters stable. No HF on exam. She will finish Herceptin this week.   2. SVT: rare symptomatic episodes lasting a few minutes. Symptomatically much improved with Toprol XL. Will continue. If symptoms become more frequent, we could increase Toprol or have her see EP for possible ablation. Can f/u with Dr. Anne Fu on yearly basis.   3. History of PE: On coumadin, has IVC filter. Can consider NOAC in future. She will discuss with Dr. Welton Flakes.  Daniel Bensimhon,MD 9:20 AM

## 2013-09-21 NOTE — Patient Instructions (Addendum)
We will continue to fill your prescription for the Metoprolol until you secure an appointment with Dr. Anne Fu  CONGRATULATIONS ON COMPLETING TREATMENTS!  Your physician recommends that you schedule a follow-up appointment in: AS NEEDED

## 2013-09-22 ENCOUNTER — Ambulatory Visit (HOSPITAL_COMMUNITY): Payer: Medicare Other

## 2013-09-22 ENCOUNTER — Encounter (HOSPITAL_COMMUNITY): Payer: Medicare Other

## 2013-09-23 ENCOUNTER — Other Ambulatory Visit (HOSPITAL_BASED_OUTPATIENT_CLINIC_OR_DEPARTMENT_OTHER): Payer: Medicare Other | Admitting: Lab

## 2013-09-23 ENCOUNTER — Ambulatory Visit (HOSPITAL_BASED_OUTPATIENT_CLINIC_OR_DEPARTMENT_OTHER): Payer: Medicare Other | Admitting: Oncology

## 2013-09-23 ENCOUNTER — Telehealth: Payer: Self-pay | Admitting: *Deleted

## 2013-09-23 ENCOUNTER — Telehealth: Payer: Self-pay | Admitting: Oncology

## 2013-09-23 ENCOUNTER — Ambulatory Visit (HOSPITAL_BASED_OUTPATIENT_CLINIC_OR_DEPARTMENT_OTHER): Payer: Medicare Other

## 2013-09-23 VITALS — BP 139/83 | HR 82 | Temp 98.3°F | Resp 18 | Ht 60.5 in | Wt 151.2 lb

## 2013-09-23 DIAGNOSIS — I2699 Other pulmonary embolism without acute cor pulmonale: Secondary | ICD-10-CM

## 2013-09-23 DIAGNOSIS — I82403 Acute embolism and thrombosis of unspecified deep veins of lower extremity, bilateral: Secondary | ICD-10-CM

## 2013-09-23 DIAGNOSIS — I82409 Acute embolism and thrombosis of unspecified deep veins of unspecified lower extremity: Secondary | ICD-10-CM | POA: Diagnosis not present

## 2013-09-23 DIAGNOSIS — Z5112 Encounter for antineoplastic immunotherapy: Secondary | ICD-10-CM

## 2013-09-23 DIAGNOSIS — C773 Secondary and unspecified malignant neoplasm of axilla and upper limb lymph nodes: Secondary | ICD-10-CM

## 2013-09-23 DIAGNOSIS — C50919 Malignant neoplasm of unspecified site of unspecified female breast: Secondary | ICD-10-CM

## 2013-09-23 DIAGNOSIS — C50119 Malignant neoplasm of central portion of unspecified female breast: Secondary | ICD-10-CM

## 2013-09-23 DIAGNOSIS — C50111 Malignant neoplasm of central portion of right female breast: Secondary | ICD-10-CM

## 2013-09-23 DIAGNOSIS — Z7901 Long term (current) use of anticoagulants: Secondary | ICD-10-CM

## 2013-09-23 DIAGNOSIS — C50112 Malignant neoplasm of central portion of left female breast: Secondary | ICD-10-CM

## 2013-09-23 LAB — CBC WITH DIFFERENTIAL/PLATELET
BASO%: 0.3 % (ref 0.0–2.0)
EOS%: 3.2 % (ref 0.0–7.0)
HCT: 34.7 % — ABNORMAL LOW (ref 34.8–46.6)
LYMPH%: 20 % (ref 14.0–49.7)
MCH: 29.6 pg (ref 25.1–34.0)
MCHC: 34 g/dL (ref 31.5–36.0)
MCV: 87 fL (ref 79.5–101.0)
MONO%: 7.4 % (ref 0.0–14.0)
Platelets: 192 10*3/uL (ref 145–400)
RBC: 3.99 10*6/uL (ref 3.70–5.45)
nRBC: 0 % (ref 0–0)

## 2013-09-23 LAB — COMPREHENSIVE METABOLIC PANEL (CC13)
ALT: 21 U/L (ref 0–55)
AST: 20 U/L (ref 5–34)
Alkaline Phosphatase: 79 U/L (ref 40–150)
Anion Gap: 8 mEq/L (ref 3–11)
Creatinine: 1 mg/dL (ref 0.6–1.1)
Total Bilirubin: 0.21 mg/dL (ref 0.20–1.20)
Total Protein: 7 g/dL (ref 6.4–8.3)

## 2013-09-23 LAB — PROTIME-INR
INR: 3.1 (ref 2.00–3.50)
Protime: 37.2 Seconds — ABNORMAL HIGH (ref 10.6–13.4)

## 2013-09-23 MED ORDER — SODIUM CHLORIDE 0.9 % IV SOLN
Freq: Once | INTRAVENOUS | Status: AC
  Administered 2013-09-23: 16:00:00 via INTRAVENOUS

## 2013-09-23 MED ORDER — HEPARIN SOD (PORK) LOCK FLUSH 100 UNIT/ML IV SOLN
500.0000 [IU] | Freq: Once | INTRAVENOUS | Status: AC | PRN
  Administered 2013-09-23: 500 [IU]
  Filled 2013-09-23: qty 5

## 2013-09-23 MED ORDER — SODIUM CHLORIDE 0.9 % IJ SOLN
10.0000 mL | INTRAMUSCULAR | Status: DC | PRN
  Administered 2013-09-23: 10 mL
  Filled 2013-09-23: qty 10

## 2013-09-23 MED ORDER — ACETAMINOPHEN 325 MG PO TABS
650.0000 mg | ORAL_TABLET | Freq: Once | ORAL | Status: AC
  Administered 2013-09-23: 650 mg via ORAL

## 2013-09-23 MED ORDER — ACETAMINOPHEN 325 MG PO TABS
ORAL_TABLET | ORAL | Status: AC
  Filled 2013-09-23: qty 2

## 2013-09-23 MED ORDER — TRASTUZUMAB CHEMO INJECTION 440 MG
6.0000 mg/kg | Freq: Once | INTRAVENOUS | Status: AC
  Administered 2013-09-23: 378 mg via INTRAVENOUS
  Filled 2013-09-23: qty 18

## 2013-09-23 NOTE — Patient Instructions (Signed)
Proceed with herceptin today  We will see you back in 3 weeks

## 2013-09-23 NOTE — Progress Notes (Signed)
[facility]  [phone] [phone]  OFFICE PROGRESS NOTE   PATIENT: Jessica Wells   DOB: 1946/08/01  MR#: [id number]  AOZ#:[id number]   NG:[id number],XLKGMWNUU STEWART, MD Lurline Hare, MD Emelia Loron, MD Arvilla Meres, MD Charlaine Dalton. Sherene Sires, MD    DIAGNOSIS:  67 year-old [place], [place] woman with a recent diagnosis of inflammatory breast cancer.   PRIOR THERAPY: 1.  The patient was originally seen in clinic for new diagnosis of inflammatory breast cancer, she was referred by Dr. Emelia Loron. She had a mammogram performed on 07/14/2012 that showed an abnormality. That was biopsied and it showed an invasive mammary carcinoma with lymphovascular invasion grade 3 ER negative PR negative HER-2/neu positive.   2.  The patient had a MRI of the breasts performed on 07/19/2012.  The MRI showed diffuse right breast neoplasm on a large level I right axillary lymph nodes compatible with lymphatic spread.  The patient has had a biopsy of the right axillary lymph node that is compatible with invasive mammary carcinoma.   3.  The patient began neoadjuvant FEC 100 with day 2 Neulasta support on 07/23/2012.   4.   Weekly neoadjuvant Taxol and Herceptin started on 09/17/12.  The patient developed neuropathies, and Taxol was discontinued early.  She completed 7 weeks of Taxol/Herceptin combination therapy.  The patient then received 2 weeks of Herceptin only.   5. Weekly Gemcitabine and Herceptin was started on 11/19/12 and completed on 12/17/2012 after 5 cycles.  6.  A regimen of Lasix 20 mg PO and Kdur 10 mEq was started on 01/07/2013 due to chronic lower extremity edema.  Medications are only to be taken if patient has a weight gain of 5 lbs or more in a day (daily weights).   7.  Anemia was treated with blood transfusions of 1 unit of PRBCs given on 01/11/2013 and another unit of PRBC's given 01/12/2013.  8. Patient underwent right mastectomy on  02/08/13.  Pathology showed a complete response to chemotherapy in the breast and 11 nodes identified with fibrosis but no carcinoma.    9.  Radiation therapy from 03/31/13 through 05/17/13.  10.  Every 3 week herceptin started 02/25/13.    CURRENT THERAPY:  Herceptin every 3 weeks  INTERVAL HISTORY: [name] is doing well today.  She is here for evaluation prior to Herceptin.  She is doing well today. Her paronychia has resolved. Overall she is feeling well she has no nausea vomiting fevers chills night sweats. No chest pains palpitations. She was seen by Dr. Teena Dunk. He has this charged for from his clinic and she will go back to her regular in cardiologist. Remainder of the 10 point review of systems is negative. PAST MEDICAL HISTORY: Past Medical History  Diagnosis Date  . GERD (gastroesophageal reflux disease)   . Dysrhythmia     PAT-sees dr Laurence Compton meds  . Breast cancer 07/14/12    inflammatory right breast ca, ER/PR -  . Allergy   . PAT (paroxysmal atrial tachycardia)     hx  . Arthritis     osteopenia,knees  . PONV (postoperative nausea and vomiting) 09-13-12    severe, with Port-a-cath, was managed without PONV  . Clotting disorder     prothrombin gene mutation-heterozygous   . History of chemotherapy     Last dose to be 02-04-13.  Was rx'd with Herceptin & Gemzar  . DVT (deep venous thrombosis)   . Asthma     Uses Inhalers Proventil as  needed  . Bursitis 01-25-13    Rt. shoulder- mildly affected now.  . History of blood transfusion 01-25-13    2 units 01-11-13  . Pneumonia 01-25-13    hx. 12-27-12-hospital stay x 9 days, now resolved.  . S/P radiation therapy 4-12 wks ago 03/31/13-05/17/13    right breast/supraclavicular fossa/posterior axillary boost    PAST SURGICAL HISTORY: Past Surgical History  Procedure Laterality Date  . Dilation and curettage of uterus      x3  . Tonsillectomy    . Colonoscopy    . Portacath placement  07/21/2012    Procedure: INSERTION  PORT-A-CATH;  Surgeon: Emelia Loron, MD;  Location: [facility];  Service: General;  Laterality: Left;/ Replacement done 10'13  . Insertion of vena cava filter  12/2012  . Giant cell tumor  01-25-13    removed Rt. forearm.  . Eye lid surgery      right( MD office)  . Mastectomy modified radical Right 02/08/2013    Procedure: MASTECTOMY MODIFIED RADICAL;  Surgeon: Emelia Loron, MD;  Location: [facility];  Service: General;  Laterality: Right;  . Breast surgery      FAMILY HISTORY: Family History  Problem Relation Age of Onset  . Diabetes Mother   . Heart disease Mother   . Hypertension Mother   . Hyperlipidemia Mother   . Breast cancer Mother 66    lobular breast cancer  . Heart disease Maternal Aunt   . Heart disease Maternal Uncle   . Heart disease Maternal Grandmother     died in her 59s from heart disease  . Lung cancer Father 50    adenocarcinoma  . Cancer Paternal Uncle     dx in mid 63s with a cancer in the leg, died late 48s; grandpaternal half uncle    SOCIAL HISTORY: History  Substance Use Topics  . Smoking status: Former Smoker -- 1.00 packs/day for 35 years    Types: Cigarettes    Quit date: 07/21/1979  . Smokeless tobacco: Never Used  . Alcohol Use: No    ALLERGIES: Allergies  Allergen Reactions  . Anesthetics, Amide Nausea And Vomiting    Projectile vomitting-Nausea- with 24hrs of dry heaves. With any anesthetics  . Bactrim [Sulfamethoxazole-Trimethoprim] Other (See Comments)    Massive diarrhea, nausea vomiting, platelets dropped, hemoglobin dropped, admitted to hospital  . Sulfa Antibiotics Nausea And Vomiting and Other (See Comments)    Crazy feeling in head Life threatening reaction, decreased blood counts  . Codeine Nausea And Vomiting  . Codeine Phosphate Nausea And Vomiting  . Eggs Or Egg-Derived Products Hives     MEDICATIONS:  Current Outpatient Prescriptions  Medication Sig Dispense Refill  . b complex vitamins tablet  Take 1 tablet by mouth daily.      . calcium-vitamin D (OSCAL WITH D) 500-200 MG-UNIT per tablet Take 1 tablet by mouth every other day.       . Ferrous Sulfate (SLOW FE PO) Take 1 tablet by mouth every other day.      . fish oil-omega-3 fatty acids 1000 MG capsule Take 2 g by mouth daily.      . Flaxseed, Linseed, (FLAX SEEDS PO) Take 1 tablet by mouth daily.      Marland Kitchen glucosamine-chondroitin 500-400 MG tablet Take 1 tablet by mouth daily.      . metoprolol succinate (TOPROL-XL) 50 MG 24 hr tablet Take 1 tablet (50 mg total) by mouth daily before breakfast. Take with or immediately following a meal.  30 tablet  3  . Multiple Vitamin (MULTIVITAMIN WITH MINERALS) TABS Take 1 tablet by mouth every other day.       . non-metallic deodorant Thornton Papas) MISC Apply 1 application topically daily as needed.      Marland Kitchen omeprazole (PRILOSEC) 20 MG capsule Take 20 mg by mouth daily. For heartburn      . PROVENTIL HFA 108 (90 BASE) MCG/ACT inhaler Inhale 2 puffs into the lungs every 4 (four) hours as needed.       . sodium chloride 0.9 % SOLN 250 mL with trastuzumab 440 MG SOLR 2 mg/kg Inject 2 mg/kg into the vein every 21 ( twenty-one) days.      Marland Kitchen warfarin (COUMADIN) 5 MG tablet Take one 5 mg tablet by mouth on Tuesday and Friday and 7.5 mg all other days or as directed by physician.  60 tablet  1   No current facility-administered medications for this visit.      REVIEW OF SYSTEMS: A 10 point review of systems was completed and is negative except as noted above.    PHYSICAL EXAMINATION: BP 139/83  Pulse 82  Temp(Src) 98.3 F (36.8 C) (Oral)  Resp 18  Ht 5' 0.5" (1.537 m)  Wt 151 lb 3.2 oz (68.584 kg)  BMI 29.03 kg/m2    General: Patient is a well appearing female in no acute distress HEENT: PERRLA, sclerae anicteric no conjunctival pallor, MMM Neck: supple, no palpable adenopathy Lungs: clear to auscultation bilaterally, no wheezes, rhonchi, or rales Cardiovascular: regular rate rhythm, S1, S2, no  murmurs, rubs or gallops Abdomen: Soft, non-tender, non-distended, normoactive bowel sounds, no HSM Extremities: Extremities normal, atraumatic, no cyanosis, scant bilateral lower extremity edema, Right thumb erythema and swelling and warmth surrounding the nail bed.  It is more erythematous and swollen today.   Skin: No rashes or lesions.   Breasts: right mastectomy site with no nodularity or sign of recurrence, left breast no masses or nodules ECOG FS:  Grade 1  LAB RESULTS: Lab Results  Component Value Date   WBC 6.3 09/23/2013   NEUTROABS 4.3 09/23/2013   HGB 11.8 09/23/2013   HCT 34.7* 09/23/2013   MCV 87.0 09/23/2013   PLT 192 09/23/2013      Chemistry      Component Value Date/Time   NA 141 09/02/2013 1348   NA 140 02/10/2013 0400   K 4.0 09/02/2013 1348   K 3.4* 02/10/2013 0400   CL 105 05/20/2013 1459   CL 106 02/10/2013 0400   CO2 26 09/02/2013 1348   CO2 26 02/10/2013 0400   BUN 26.7* 09/02/2013 1348   BUN 11 02/10/2013 0400   CREATININE 1.2* 09/02/2013 1348   CREATININE 0.84 02/10/2013 0400      Component Value Date/Time   CALCIUM 9.2 09/02/2013 1348   CALCIUM 8.7 02/10/2013 0400   ALKPHOS 77 09/02/2013 1348   ALKPHOS 66 01/28/2013 1630   AST 21 09/02/2013 1348   AST 36 01/28/2013 1630   ALT 24 09/02/2013 1348   ALT 55* 01/28/2013 1630   BILITOT 0.25 09/02/2013 1348   BILITOT 0.2* 01/28/2013 1630      Lab Results  Component Value Date   LABCA2 16 07/19/2012     RADIOGRAPHIC STUDIES: No results found.  ASSESSMENT: 67 y.o. [place], [place] woman with:  1.  Inflammatory right breast cancer with positive lymph nodes the tumor is ER negative PR negative HER-2/neu positive.  Patient is s/p neoadjuvant chemotherapy consisting of FEC q 2 weeks x  4 cycles.  Status post weekly Gemcitabine and Herceptin that was started on 11/19/12 and completed on 12/17/2012 after 5 cycles.   Herceptin Qweekly x 12 weeks started on 01/14/2013.  Patient underwent right mastectomy by Dr. Dwain Sarna on  02/08/13 with complete pathologic response to neoadjuvant chemotherapy. Adjuvant Herceptin every 3 weeks started on 02/25/13, and radiation therapy with Dr. Michell Heinrich was received from 03/31/13 through 05/17/13.   2.  Bilateral lower extremity DVT and Bilateral PE.  The patient has a positive Prothrombin Gene Mutation, heterozygous. S/p IVC filter inserted on  12/27/2012. She will continue on Coumadin and take 7.5 mg five nights per week and 5mg  two nights per week. Her INR is stable.    3.  Lower extremity edema-near resolved today.  4.  Right thumb acute paronychia: Resolved time.  PLAN:  1. Doing well. Patient will proceed with Herceptin therapy today.    #2 we will see patient back in 3 weeks for last herceptin  All questions were answered.  The patient was encouraged to contact us in the interim with any problems, questions or concerns.   I spent 25 minutes counseling the patient face to face.  The total time spent in the appointment was 25 minutes.  Drue Second, MD Medical/Oncology [facility] [phone] (beeper) [phone] (Office)  09/23/2013, 3:33 PM

## 2013-09-23 NOTE — Patient Instructions (Signed)
[facility] Discharge Instructions for Patients Receiving Chemotherapy  Today you received the following chemotherapy agents: herceptin  To help prevent nausea and vomiting after your treatment, we encourage you to take your nausea medication.  Take it as often as prescribed.     If you develop nausea and vomiting that is not controlled by your nausea medication, call the clinic. If it is after clinic hours your family physician or the after hours number for the clinic or go to the Emergency Department.   BELOW ARE SYMPTOMS THAT SHOULD BE REPORTED IMMEDIATELY:  *FEVER GREATER THAN 100.5 F  *CHILLS WITH OR WITHOUT FEVER  NAUSEA AND VOMITING THAT IS NOT CONTROLLED WITH YOUR NAUSEA MEDICATION  *UNUSUAL SHORTNESS OF BREATH  *UNUSUAL BRUISING OR BLEEDING  TENDERNESS IN MOUTH AND THROAT WITH OR WITHOUT PRESENCE OF ULCERS  *URINARY PROBLEMS  *BOWEL PROBLEMS  UNUSUAL RASH Items with * indicate a potential emergency and should be followed up as soon as possible.  Feel free to call the clinic you have any questions or concerns. The clinic phone number is [phone].   I have been informed and understand all the instructions given to me. I know to contact the clinic, my physician, or go to the Emergency Department if any problems should occur. I do not have any questions at this time, but understand that I may call the clinic during office hours   should I have any questions or need assistance in obtaining follow up care.    __________________________________________  _____________  __________ Signature of Patient or Authorized Representative            Date                   Time    __________________________________________ Nurse's Signature    

## 2013-09-23 NOTE — Telephone Encounter (Signed)
Per staff message and POF I have scheduled appts.  JMW  

## 2013-09-27 ENCOUNTER — Other Ambulatory Visit (HOSPITAL_COMMUNITY): Payer: Medicare Other

## 2013-10-03 DIAGNOSIS — M899 Disorder of bone, unspecified: Secondary | ICD-10-CM | POA: Diagnosis not present

## 2013-10-03 DIAGNOSIS — Z1231 Encounter for screening mammogram for malignant neoplasm of breast: Secondary | ICD-10-CM | POA: Diagnosis not present

## 2013-10-06 ENCOUNTER — Other Ambulatory Visit: Payer: Self-pay

## 2013-10-14 ENCOUNTER — Ambulatory Visit: Payer: Medicare Other

## 2013-10-14 ENCOUNTER — Ambulatory Visit (HOSPITAL_BASED_OUTPATIENT_CLINIC_OR_DEPARTMENT_OTHER): Payer: Medicare Other

## 2013-10-14 ENCOUNTER — Other Ambulatory Visit: Payer: Medicare Other | Admitting: Lab

## 2013-10-14 ENCOUNTER — Telehealth: Payer: Self-pay | Admitting: Oncology

## 2013-10-14 ENCOUNTER — Other Ambulatory Visit (HOSPITAL_BASED_OUTPATIENT_CLINIC_OR_DEPARTMENT_OTHER): Payer: Medicare Other | Admitting: Lab

## 2013-10-14 ENCOUNTER — Ambulatory Visit: Payer: Medicare Other | Admitting: Adult Health

## 2013-10-14 ENCOUNTER — Other Ambulatory Visit (INDEPENDENT_AMBULATORY_CARE_PROVIDER_SITE_OTHER): Payer: Self-pay | Admitting: *Deleted

## 2013-10-14 ENCOUNTER — Ambulatory Visit (HOSPITAL_BASED_OUTPATIENT_CLINIC_OR_DEPARTMENT_OTHER): Payer: Medicare Other | Admitting: Adult Health

## 2013-10-14 ENCOUNTER — Encounter: Payer: Self-pay | Admitting: Adult Health

## 2013-10-14 VITALS — BP 144/80 | HR 76 | Temp 98.2°F | Resp 20 | Ht 60.5 in | Wt 150.6 lb

## 2013-10-14 DIAGNOSIS — C50919 Malignant neoplasm of unspecified site of unspecified female breast: Secondary | ICD-10-CM

## 2013-10-14 DIAGNOSIS — C50111 Malignant neoplasm of central portion of right female breast: Secondary | ICD-10-CM

## 2013-10-14 DIAGNOSIS — Z86711 Personal history of pulmonary embolism: Secondary | ICD-10-CM | POA: Diagnosis not present

## 2013-10-14 DIAGNOSIS — Z5181 Encounter for therapeutic drug level monitoring: Secondary | ICD-10-CM

## 2013-10-14 DIAGNOSIS — Z86718 Personal history of other venous thrombosis and embolism: Secondary | ICD-10-CM

## 2013-10-14 DIAGNOSIS — I82409 Acute embolism and thrombosis of unspecified deep veins of unspecified lower extremity: Secondary | ICD-10-CM | POA: Diagnosis not present

## 2013-10-14 DIAGNOSIS — Z5112 Encounter for antineoplastic immunotherapy: Secondary | ICD-10-CM

## 2013-10-14 DIAGNOSIS — Z7901 Long term (current) use of anticoagulants: Secondary | ICD-10-CM | POA: Diagnosis not present

## 2013-10-14 DIAGNOSIS — Z171 Estrogen receptor negative status [ER-]: Secondary | ICD-10-CM | POA: Diagnosis not present

## 2013-10-14 DIAGNOSIS — C50112 Malignant neoplasm of central portion of left female breast: Secondary | ICD-10-CM

## 2013-10-14 DIAGNOSIS — C773 Secondary and unspecified malignant neoplasm of axilla and upper limb lymph nodes: Secondary | ICD-10-CM | POA: Diagnosis not present

## 2013-10-14 LAB — PROTIME-INR: Protime: 19.2 Seconds — ABNORMAL HIGH (ref 10.6–13.4)

## 2013-10-14 LAB — CBC WITH DIFFERENTIAL/PLATELET
EOS%: 3.1 % (ref 0.0–7.0)
Eosinophils Absolute: 0.2 10*3/uL (ref 0.0–0.5)
HCT: 34.9 % (ref 34.8–46.6)
HGB: 11.7 g/dL (ref 11.6–15.9)
LYMPH%: 20.2 % (ref 14.0–49.7)
MCH: 29.4 pg (ref 25.1–34.0)
MCHC: 33.5 g/dL (ref 31.5–36.0)
MCV: 87.7 fL (ref 79.5–101.0)
NEUT#: 4.8 10*3/uL (ref 1.5–6.5)
NEUT%: 68 % (ref 38.4–76.8)
Platelets: 209 10*3/uL (ref 145–400)
RBC: 3.98 10*6/uL (ref 3.70–5.45)
WBC: 7 10*3/uL (ref 3.9–10.3)

## 2013-10-14 LAB — COMPREHENSIVE METABOLIC PANEL (CC13)
AST: 20 U/L (ref 5–34)
Albumin: 3.7 g/dL (ref 3.5–5.0)
Alkaline Phosphatase: 79 U/L (ref 40–150)
Anion Gap: 11 mEq/L (ref 3–11)
CO2: 25 mEq/L (ref 22–29)
Glucose: 102 mg/dl (ref 70–140)
Potassium: 3.9 mEq/L (ref 3.5–5.1)
Sodium: 141 mEq/L (ref 136–145)
Total Protein: 7 g/dL (ref 6.4–8.3)

## 2013-10-14 MED ORDER — ACETAMINOPHEN 325 MG PO TABS
650.0000 mg | ORAL_TABLET | Freq: Once | ORAL | Status: AC
  Administered 2013-10-14: 650 mg via ORAL

## 2013-10-14 MED ORDER — UNABLE TO FIND: Status: DC

## 2013-10-14 MED ORDER — HEPARIN SOD (PORK) LOCK FLUSH 100 UNIT/ML IV SOLN
500.0000 [IU] | Freq: Once | INTRAVENOUS | Status: AC | PRN
  Administered 2013-10-14: 500 [IU]
  Filled 2013-10-14: qty 5

## 2013-10-14 MED ORDER — TRASTUZUMAB CHEMO INJECTION 440 MG
6.0000 mg/kg | Freq: Once | INTRAVENOUS | Status: AC
  Administered 2013-10-14: 378 mg via INTRAVENOUS
  Filled 2013-10-14: qty 18

## 2013-10-14 MED ORDER — SODIUM CHLORIDE 0.9 % IJ SOLN
10.0000 mL | INTRAMUSCULAR | Status: DC | PRN
  Administered 2013-10-14: 10 mL
  Filled 2013-10-14: qty 10

## 2013-10-14 MED ORDER — SODIUM CHLORIDE 0.9 % IV SOLN
Freq: Once | INTRAVENOUS | Status: AC
  Administered 2013-10-14: 15:00:00 via INTRAVENOUS

## 2013-10-14 MED ORDER — ACETAMINOPHEN 325 MG PO TABS
ORAL_TABLET | ORAL | Status: AC
  Filled 2013-10-14: qty 2

## 2013-10-14 NOTE — Progress Notes (Addendum)
[facility]  Telephone:(336) [phone] Fax:(336) [phone]  OFFICE PROGRESS NOTE   PATIENT: Jessica Wells   DOB: [birth year]  MR#: [id number]  JYN#:[id number]   QM:VHQION,GEXBMWUXL STEWART, MD Lurline Hare, MD Emelia Loron, MD Arvilla Meres, MD Charlaine Dalton. Sherene Sires, MD    DIAGNOSIS:  67 year-old [place], [place] woman with a recent diagnosis of inflammatory breast cancer.   PRIOR THERAPY: 1.  The patient was originally seen in clinic for new diagnosis of inflammatory breast cancer, she was referred by Dr. Emelia Loron. She had a mammogram performed on 07/14/2012 that showed an abnormality. That was biopsied and it showed an invasive mammary carcinoma with lymphovascular invasion grade 3 ER negative PR negative HER-2/neu positive.   2.  The patient had a MRI of the breasts performed on 07/19/2012.  The MRI showed diffuse right breast neoplasm on a large level I right axillary lymph nodes compatible with lymphatic spread.  The patient has had a biopsy of the right axillary lymph node that is compatible with invasive mammary carcinoma.   3.  The patient began neoadjuvant FEC 100 with day 2 Neulasta support on 07/23/2012.   4.   Weekly neoadjuvant Taxol and Herceptin started on 09/17/12.  The patient developed neuropathies, and Taxol was discontinued early.  She completed 7 weeks of Taxol/Herceptin combination therapy.  The patient then received 2 weeks of Herceptin only.   5. Weekly Gemcitabine and Herceptin was started on 11/19/12 and completed on 12/17/2012 after 5 cycles.  6.  A regimen of Lasix 20 mg PO and Kdur 10 mEq was started on 01/07/2013 due to chronic lower extremity edema.  Medications are only to be taken if patient has a weight gain of 5 lbs or more in a day (daily weights).   7.  Anemia was treated with blood transfusions of 1 unit of PRBCs given on 01/11/2013 and another unit of PRBC's given 01/12/2013.  8. Patient underwent right mastectomy on  02/08/13.  Pathology showed a complete response to chemotherapy in the breast and 11 nodes identified with fibrosis but no carcinoma.    9.  Radiation therapy from 03/31/13 through 05/17/13.  10.  Every 3 week herceptin from 02/25/13 through 10/14/13.    CURRENT THERAPY:  Herceptin every 3 weeks  INTERVAL HISTORY: [name] is doing well today.  She is here for evaluation prior to Herceptin. She recently saw Dr. Gala Romney and was released to f/u only PRN.  She would like to keep her port for 6 months following the completion of herceptin. She denies chest pain, swelling, DOE, PND orthopnea, pain or any further concerns.  She would also like to discuss a different anti-coagulant, such as Xarelto or Epixiban.  A 10 point ROS is neg.   PAST MEDICAL HISTORY: Past Medical History  Diagnosis Date  . GERD (gastroesophageal reflux disease)   . Dysrhythmia     PAT-sees dr Laurence Compton meds  . Breast cancer 07/14/12    inflammatory right breast ca, ER/PR -  . Allergy   . PAT (paroxysmal atrial tachycardia)     hx  . Arthritis     osteopenia,knees  . PONV (postoperative nausea and vomiting) 09-13-12    severe, with Port-a-cath, was managed without PONV  . Clotting disorder     prothrombin gene mutation-heterozygous   . History of chemotherapy     Last dose to be 02-04-13.  Was rx'd with Herceptin & Gemzar  . DVT (deep venous thrombosis)   . Asthma  Uses Inhalers Proventil as needed  . Bursitis 01-25-13    Rt. shoulder- mildly affected now.  . History of blood transfusion 01-25-13    2 units 01-11-13  . Pneumonia 01-25-13    hx. 12-27-12-hospital stay x 9 days, now resolved.  . S/P radiation therapy 4-12 wks ago 03/31/13-05/17/13    right breast/supraclavicular fossa/posterior axillary boost    PAST SURGICAL HISTORY: Past Surgical History  Procedure Laterality Date  . Dilation and curettage of uterus      x3  . Tonsillectomy    . Colonoscopy    . Portacath placement  07/21/2012    Procedure:  INSERTION PORT-A-CATH;  Surgeon: Emelia Loron, MD;  Location: [facility];  Service: General;  Laterality: Left;/ Replacement done 10'13  . Insertion of vena cava filter  12/2012  . Giant cell tumor  01-25-13    removed Rt. forearm.  . Eye lid surgery      right( MD office)  . Mastectomy modified radical Right 02/08/2013    Procedure: MASTECTOMY MODIFIED RADICAL;  Surgeon: Emelia Loron, MD;  Location: [facility];  Service: General;  Laterality: Right;  . Breast surgery      FAMILY HISTORY: Family History  Problem Relation Age of Onset  . Diabetes Mother   . Heart disease Mother   . Hypertension Mother   . Hyperlipidemia Mother   . Breast cancer Mother 29    lobular breast cancer  . Heart disease Maternal Aunt   . Heart disease Maternal Uncle   . Heart disease Maternal Grandmother     died in her 34s from heart disease  . Lung cancer Father 96    adenocarcinoma  . Cancer Paternal Uncle     dx in mid 33s with a cancer in the leg, died late 61s; grandpaternal half uncle    SOCIAL HISTORY: History  Substance Use Topics  . Smoking status: Former Smoker -- 1.00 packs/day for 35 years    Types: Cigarettes    Quit date: 07/21/1979  . Smokeless tobacco: Never Used  . Alcohol Use: No    ALLERGIES: Allergies  Allergen Reactions  . Anesthetics, Amide Nausea And Vomiting    Projectile vomitting-Nausea- with 24hrs of dry heaves. With any anesthetics  . Bactrim [Sulfamethoxazole-Trimethoprim] Other (See Comments)    Massive diarrhea, nausea vomiting, platelets dropped, hemoglobin dropped, admitted to hospital  . Sulfa Antibiotics Nausea And Vomiting and Other (See Comments)    Crazy feeling in head Life threatening reaction, decreased blood counts  . Codeine Nausea And Vomiting  . Codeine Phosphate Nausea And Vomiting  . Eggs Or Egg-Derived Products Hives     MEDICATIONS:  Current Outpatient Prescriptions  Medication Sig Dispense Refill  . b complex  vitamins tablet Take 1 tablet by mouth daily.      . calcium-vitamin D (OSCAL WITH D) 500-200 MG-UNIT per tablet Take 1 tablet by mouth every other day.       . Ferrous Sulfate (SLOW FE PO) Take 1 tablet by mouth every other day.      . fish oil-omega-3 fatty acids 1000 MG capsule Take 2 g by mouth daily.      . Flaxseed, Linseed, (FLAX SEEDS PO) Take 1 tablet by mouth daily.      Marland Kitchen glucosamine-chondroitin 500-400 MG tablet Take 1 tablet by mouth daily.      . metoprolol succinate (TOPROL-XL) 50 MG 24 hr tablet Take 1 tablet (50 mg total) by mouth daily before breakfast. Take with or  immediately following a meal.  30 tablet  3  . Multiple Vitamin (MULTIVITAMIN WITH MINERALS) TABS Take 1 tablet by mouth every other day.       Marland Kitchen omeprazole (PRILOSEC) 20 MG capsule Take 20 mg by mouth daily. For heartburn      . PROVENTIL HFA 108 (90 BASE) MCG/ACT inhaler Inhale 2 puffs into the lungs every 4 (four) hours as needed.       . sodium chloride 0.9 % SOLN 250 mL with trastuzumab 440 MG SOLR 2 mg/kg Inject 2 mg/kg into the vein every 21 ( twenty-one) days.      Marland Kitchen warfarin (COUMADIN) 5 MG tablet Take one 5 mg tablet by mouth on Tuesday and Friday and 7.5 mg all other days or as directed by physician.  60 tablet  1  . non-metallic deodorant (ALRA) MISC Apply 1 application topically daily as needed.      Marland Kitchen UNABLE TO FIND Rx: L8000- Post Surgical Bras (Quantity: 6) Z6109- Silicone Breast Prosthesis (Quantity: 1)  Replacement due to chest wall changes Dx: 174.9; Right mastectomy  1 each  0   No current facility-administered medications for this visit.      REVIEW OF SYSTEMS: A 10 point review of systems was completed and is negative except as noted above.    PHYSICAL EXAMINATION: BP 144/80  Pulse 76  Temp(Src) 98.2 F (36.8 C) (Oral)  Resp 20  Ht 5' 0.5" (1.537 m)  Wt 150 lb 9.6 oz (68.312 kg)  BMI 28.92 kg/m2    General: Patient is a well appearing female in no acute distress HEENT: PERRLA,  sclerae anicteric no conjunctival pallor, MMM Neck: supple, no palpable adenopathy Lungs: clear to auscultation bilaterally, no wheezes, rhonchi, or rales Cardiovascular: regular rate rhythm, S1, S2, no murmurs, rubs or gallops Abdomen: Soft, non-tender, non-distended, normoactive bowel sounds, no HSM Extremities: Extremities normal, atraumatic, no cyanosis, or edema  Skin: No rashes or lesions.   Breasts: right mastectomy site with no nodularity or sign of recurrence, left breast no masses or nodules ECOG FS:  Grade 1  LAB RESULTS: Lab Results  Component Value Date   WBC 7.0 10/14/2013   NEUTROABS 4.8 10/14/2013   HGB 11.7 10/14/2013   HCT 34.9 10/14/2013   MCV 87.7 10/14/2013   PLT 209 10/14/2013      Chemistry      Component Value Date/Time   NA 141 10/14/2013 1340   NA 140 02/10/2013 0400   K 3.9 10/14/2013 1340   K 3.4* 02/10/2013 0400   CL 105 05/20/2013 1459   CL 106 02/10/2013 0400   CO2 25 10/14/2013 1340   CO2 26 02/10/2013 0400   BUN 21.2 10/14/2013 1340   BUN 11 02/10/2013 0400   CREATININE 1.1 10/14/2013 1340   CREATININE 0.84 02/10/2013 0400      Component Value Date/Time   CALCIUM 9.7 10/14/2013 1340   CALCIUM 8.7 02/10/2013 0400   ALKPHOS 79 10/14/2013 1340   ALKPHOS 66 01/28/2013 1630   AST 20 10/14/2013 1340   AST 36 01/28/2013 1630   ALT 22 10/14/2013 1340   ALT 55* 01/28/2013 1630   BILITOT 0.27 10/14/2013 1340   BILITOT 0.2* 01/28/2013 1630      Lab Results  Component Value Date   LABCA2 16 07/19/2012     RADIOGRAPHIC STUDIES: No results found.  ASSESSMENT: 67 y.o. [place], [place] woman with:  1.  Inflammatory right breast cancer with positive lymph nodes the tumor is ER negative PR  negative HER-2/neu positive.  Patient is s/p neoadjuvant chemotherapy consisting of FEC q 2 weeks x 4 cycles.  Status post weekly Gemcitabine and Herceptin that was started on 11/19/12 and completed on 12/17/2012 after 5 cycles.   Herceptin Qweekly x 12 weeks started on  01/14/2013.  Patient underwent right mastectomy by Dr. Dwain Sarna on 02/08/13 with complete pathologic response to neoadjuvant chemotherapy. Adjuvant Herceptin every 3 weeks started on 02/25/13, and radiation therapy with Dr. Michell Heinrich was received from 03/31/13 through 05/17/13.   2.  Bilateral lower extremity DVT and Bilateral PE.  The patient has a positive Prothrombin Gene Mutation, heterozygous. S/p IVC filter inserted on  12/27/2012. She will continue on Coumadin and take 7.5 mg five nights per week and 5mg  two nights per week. Her INR is stable.    3.  Lower extremity edema-near resolved today.  4.  Right thumb acute paronychia: Resolved.    PLAN:  1. Doing well. Patient will proceed with her final Herceptin treatment today.  I reviewed her labs with her in detail today.  2. I ordered a CT chest/abdomen/pelvis today, to evaluate for any metastases of her inflammatory breast cancer.    3.  She continues to take coumadin daily.  She did recently miss two doses, which explains the low INR today.  Her INR has previously been stable on this dose and she will continue it.    4.  She will have her port flushed monthly, and I have set up for her INR to be drawn as well.  We will see her back in 3 months to discuss changing her anticoagulant therapy and for close monitoring.    All questions were answered.  The patient was encouraged to contact us in the interim with any problems, questions or concerns.   I spent 25 minutes counseling the patient face to face.  The total time spent in the appointment was 25 minutes.  Illa Level, NP Medical Oncology [facility] [phone] 10/16/2013, 9:28 AM   ATTENDING'S ATTESTATION:  I personally reviewed patient's chart, examined patient myself, formulated the treatment plan as followed.    Patient with history of inflammatory right breast cancer that was ER negative PR negative HER-2 positive. Patient underwent neoadjuvant  chemotherapy initially consisting of FEC 100 x6 cycles followed by Taxol Herceptin x7 weeks. She then received gem cytidine and Herceptin for a total of 5 cycles. Thereafter she had a right mastectomy performed with the final pathology revealing a complete response. She is status post radiation therapy from 03/31/2013 through 05/17/2013. She received adjuvant every 3 week Herceptin from 02/25/2013 2 10/14/2013. Overall she tolerated this well.  Patient will now be seen in 3 months time for followup  Drue Second, MD Medical/Oncology [facility] [phone] (beeper) [phone] (Office)  11/03/2013, 7:25 PM

## 2013-10-14 NOTE — Telephone Encounter (Signed)
, °

## 2013-10-14 NOTE — Patient Instructions (Signed)
Congratulations for completing your final Trastuzumab injection for infusion!! What is this medicine? TRASTUZUMAB (tras TOO zoo mab) is a monoclonal antibody. It targets a protein called HER2. This protein is found in some stomach and breast cancers. This medicine can stop cancer cell growth. This medicine may be used with other cancer treatments. This medicine may be used for other purposes; ask your health care provider or pharmacist if you have questions. COMMON BRAND NAME(S): Herceptin What should I tell my health care provider before I take this medicine? They need to know if you have any of these conditions: -heart disease -heart failure -infection (especially a virus infection such as chickenpox, cold sores, or herpes) -lung or breathing disease, like asthma -recent or ongoing radiation therapy -an unusual or allergic reaction to trastuzumab, benzyl alcohol, or other medications, foods, dyes, or preservatives -pregnant or trying to get pregnant -breast-feeding How should I use this medicine? This drug is given as an infusion into a vein. It is administered in a hospital or clinic by a specially trained health care professional. Talk to your pediatrician regarding the use of this medicine in children. This medicine is not approved for use in children. Overdosage: If you think you have taken too much of this medicine contact a poison control center or emergency room at once. NOTE: This medicine is only for you. Do not share this medicine with others. What if I miss a dose? It is important not to miss a dose. Call your doctor or health care professional if you are unable to keep an appointment. What may interact with this medicine? -cyclophosphamide -doxorubicin -warfarin This list may not describe all possible interactions. Give your health care provider a list of all the medicines, herbs, non-prescription drugs, or dietary supplements you use. Also tell them if you smoke, drink  alcohol, or use illegal drugs. Some items may interact with your medicine. What should I watch for while using this medicine? Visit your doctor for checks on your progress. Report any side effects. Continue your course of treatment even though you feel ill unless your doctor tells you to stop. Call your doctor or health care professional for advice if you get a fever, chills or sore throat, or other symptoms of a cold or flu. Do not treat yourself. Try to avoid being around people who are sick. You may experience fever, chills and shaking during your first infusion. These effects are usually mild and can be treated with other medicines. Report any side effects during the infusion to your health care professional. Fever and chills usually do not happen with later infusions. What side effects may I notice from receiving this medicine? Side effects that you should report to your doctor or other health care professional as soon as possible: -breathing difficulties -chest pain or palpitations -cough -dizziness or fainting -fever or chills, sore throat -skin rash, itching or hives -swelling of the legs or ankles -unusually weak or tired Side effects that usually do not require medical attention (report to your doctor or other health care professional if they continue or are bothersome): -loss of appetite -headache -muscle aches -nausea This list may not describe all possible side effects. Call your doctor for medical advice about side effects. You may report side effects to FDA at 1-800-FDA-1088. Where should I keep my medicine? This drug is given in a hospital or clinic and will not be stored at home. NOTE: This sheet is a summary. It may not cover all possible information. If you have  questions about this medicine, talk to your doctor, pharmacist, or health care provider.  2014, Elsevier/Gold Standard. (2009-09-21 13:43:15)

## 2013-10-19 ENCOUNTER — Ambulatory Visit (HOSPITAL_COMMUNITY)
Admission: RE | Admit: 2013-10-19 | Discharge: 2013-10-19 | Disposition: A | Discharge status: Home / Self Care | Payer: Medicare Other | Source: Ambulatory Visit | Attending: Adult Health | Admitting: Adult Health

## 2013-10-19 ENCOUNTER — Encounter (HOSPITAL_COMMUNITY): Payer: Self-pay

## 2013-10-19 DIAGNOSIS — C50919 Malignant neoplasm of unspecified site of unspecified female breast: Secondary | ICD-10-CM | POA: Insufficient documentation

## 2013-10-19 DIAGNOSIS — R918 Other nonspecific abnormal finding of lung field: Secondary | ICD-10-CM | POA: Insufficient documentation

## 2013-10-19 DIAGNOSIS — M47814 Spondylosis without myelopathy or radiculopathy, thoracic region: Secondary | ICD-10-CM | POA: Diagnosis not present

## 2013-10-19 DIAGNOSIS — R935 Abnormal findings on diagnostic imaging of other abdominal regions, including retroperitoneum: Secondary | ICD-10-CM | POA: Diagnosis not present

## 2013-10-19 DIAGNOSIS — C50111 Malignant neoplasm of central portion of right female breast: Secondary | ICD-10-CM

## 2013-10-19 DIAGNOSIS — K449 Diaphragmatic hernia without obstruction or gangrene: Secondary | ICD-10-CM | POA: Diagnosis not present

## 2013-10-19 DIAGNOSIS — Z9010 Acquired absence of unspecified breast and nipple: Secondary | ICD-10-CM | POA: Diagnosis not present

## 2013-10-19 DIAGNOSIS — R911 Solitary pulmonary nodule: Secondary | ICD-10-CM | POA: Diagnosis not present

## 2013-10-19 MED ORDER — IOHEXOL 300 MG/ML  SOLN
100.0000 mL | Freq: Once | INTRAMUSCULAR | Status: AC | PRN
  Administered 2013-10-19: 100 mL via INTRAVENOUS

## 2013-11-09 ENCOUNTER — Other Ambulatory Visit: Payer: Self-pay | Admitting: Oncology

## 2013-11-11 ENCOUNTER — Other Ambulatory Visit (HOSPITAL_BASED_OUTPATIENT_CLINIC_OR_DEPARTMENT_OTHER): Payer: Medicare Other

## 2013-11-11 ENCOUNTER — Ambulatory Visit: Payer: Medicare Other

## 2013-11-11 VITALS — BP 126/91 | HR 88 | Temp 98.2°F

## 2013-11-11 DIAGNOSIS — C773 Secondary and unspecified malignant neoplasm of axilla and upper limb lymph nodes: Secondary | ICD-10-CM

## 2013-11-11 DIAGNOSIS — I82409 Acute embolism and thrombosis of unspecified deep veins of unspecified lower extremity: Secondary | ICD-10-CM | POA: Diagnosis not present

## 2013-11-11 DIAGNOSIS — Z95828 Presence of other vascular implants and grafts: Secondary | ICD-10-CM

## 2013-11-11 DIAGNOSIS — C50919 Malignant neoplasm of unspecified site of unspecified female breast: Secondary | ICD-10-CM

## 2013-11-11 DIAGNOSIS — I2699 Other pulmonary embolism without acute cor pulmonale: Secondary | ICD-10-CM | POA: Diagnosis not present

## 2013-11-11 DIAGNOSIS — C50112 Malignant neoplasm of central portion of left female breast: Secondary | ICD-10-CM

## 2013-11-11 DIAGNOSIS — Z7901 Long term (current) use of anticoagulants: Secondary | ICD-10-CM

## 2013-11-11 LAB — CBC WITH DIFFERENTIAL/PLATELET
BASO%: 0.7 % (ref 0.0–2.0)
EOS%: 3.2 % (ref 0.0–7.0)
Eosinophils Absolute: 0.2 10*3/uL (ref 0.0–0.5)
LYMPH%: 20.3 % (ref 14.0–49.7)
MCH: 30.1 pg (ref 25.1–34.0)
MCHC: 34.3 g/dL (ref 31.5–36.0)
MCV: 87.7 fL (ref 79.5–101.0)
MONO#: 0.6 10*3/uL (ref 0.1–0.9)
MONO%: 9.7 % (ref 0.0–14.0)
Platelets: 178 10*3/uL (ref 145–400)
RBC: 3.63 10*6/uL — ABNORMAL LOW (ref 3.70–5.45)
WBC: 6.1 10*3/uL (ref 3.9–10.3)

## 2013-11-11 LAB — COMPREHENSIVE METABOLIC PANEL (CC13)
Alkaline Phosphatase: 73 U/L (ref 40–150)
Anion Gap: 11 mEq/L (ref 3–11)
BUN: 29.3 mg/dL — ABNORMAL HIGH (ref 7.0–26.0)
CO2: 24 mEq/L (ref 22–29)
Calcium: 8.9 mg/dL (ref 8.4–10.4)
Chloride: 106 mEq/L (ref 98–109)
Creatinine: 1.1 mg/dL (ref 0.6–1.1)
Glucose: 167 mg/dl — ABNORMAL HIGH (ref 70–140)
Sodium: 141 mEq/L (ref 136–145)

## 2013-11-11 MED ORDER — SODIUM CHLORIDE 0.9 % IJ SOLN
10.0000 mL | INTRAMUSCULAR | Status: DC | PRN
  Administered 2013-11-11: 10 mL via INTRAVENOUS
  Filled 2013-11-11: qty 10

## 2013-11-11 MED ORDER — HEPARIN SOD (PORK) LOCK FLUSH 100 UNIT/ML IV SOLN
500.0000 [IU] | Freq: Once | INTRAVENOUS | Status: AC
  Administered 2013-11-11: 500 [IU] via INTRAVENOUS
  Filled 2013-11-11: qty 5

## 2013-11-21 ENCOUNTER — Encounter (INDEPENDENT_AMBULATORY_CARE_PROVIDER_SITE_OTHER): Payer: Self-pay | Admitting: General Surgery

## 2013-11-21 ENCOUNTER — Ambulatory Visit (INDEPENDENT_AMBULATORY_CARE_PROVIDER_SITE_OTHER): Payer: Medicare Other | Admitting: General Surgery

## 2013-11-21 VITALS — BP 134/88 | HR 80 | Temp 98.4°F | Resp 14 | Ht 61.0 in | Wt 156.4 lb

## 2013-11-21 DIAGNOSIS — C50919 Malignant neoplasm of unspecified site of unspecified female breast: Secondary | ICD-10-CM

## 2013-11-21 DIAGNOSIS — C50911 Malignant neoplasm of unspecified site of right female breast: Secondary | ICD-10-CM

## 2013-11-21 NOTE — Progress Notes (Signed)
Subjective:     Patient ID: Jessica Wells, female   DOB: January 27, 1946, 67 y.o.   MRN: [id number]  HPI This is a 67 year old female I know well from a right modified radical mastectomy for inflammatory breast cancer. This is HER-2/neu amplified. She underwent primary chemotherapy followed by surgery. She then underwent radiation therapy following that. Her Herceptin was completed in November of 2014. She had a mammogram on the left side in November that was negative. She had CT chest abdomen pelvis that were negative recently as well. She has no real complaints today. She does have some issues with her right shoulder and her mobility. She also is considering undergoing a reduction on the left side due to her size mismatch after a mastectomy.  Review of Systems     Objective:   Physical Exam  Constitutional: She appears well-developed and well-nourished.  Pulmonary/Chest: Right breast exhibits no mass, no skin change and no tenderness. Left breast exhibits no inverted nipple, no mass, no nipple discharge, no skin change and no tenderness.    Lymphadenopathy:    She has no cervical adenopathy.    She has no axillary adenopathy.       Right: No supraclavicular adenopathy present.       Left: No supraclavicular adenopathy present.       Assessment:     Inflammatory breast cancer s/p right mrm, chemo/xrt     Plan:     She does not have any evidence of a clinical recurrence. She is up-to-date on her mammogram. We discussed a physical therapy referral. I think this would be helpful to her. She would like to wait until after the holidays to consider doing this. She's going to call me when she wants to do that. I also discussed undergoing plastic surgery to discuss a reduction on the other side. She said she like to wait about 6 months for this. I told her that I could remove her port at a later date we can combine this if she can have anything done with the plastic surgeons. I will plan on seeing her  in one year otherwise.

## 2013-12-02 ENCOUNTER — Other Ambulatory Visit: Payer: Self-pay | Admitting: Oncology

## 2013-12-02 MED ORDER — WARFARIN SODIUM 5 MG PO TABS
ORAL_TABLET | ORAL | Status: DC
Start: 2013-12-02 — End: 2013-12-23

## 2013-12-09 ENCOUNTER — Other Ambulatory Visit (HOSPITAL_BASED_OUTPATIENT_CLINIC_OR_DEPARTMENT_OTHER): Payer: Medicare Other

## 2013-12-09 ENCOUNTER — Ambulatory Visit (HOSPITAL_BASED_OUTPATIENT_CLINIC_OR_DEPARTMENT_OTHER): Payer: Medicare Other

## 2013-12-09 VITALS — BP 130/71 | HR 89 | Temp 98.2°F

## 2013-12-09 DIAGNOSIS — C50919 Malignant neoplasm of unspecified site of unspecified female breast: Secondary | ICD-10-CM | POA: Diagnosis not present

## 2013-12-09 DIAGNOSIS — C773 Secondary and unspecified malignant neoplasm of axilla and upper limb lymph nodes: Secondary | ICD-10-CM | POA: Diagnosis not present

## 2013-12-09 DIAGNOSIS — Z452 Encounter for adjustment and management of vascular access device: Secondary | ICD-10-CM

## 2013-12-09 DIAGNOSIS — Z7901 Long term (current) use of anticoagulants: Secondary | ICD-10-CM | POA: Diagnosis not present

## 2013-12-09 DIAGNOSIS — Z5181 Encounter for therapeutic drug level monitoring: Secondary | ICD-10-CM | POA: Diagnosis not present

## 2013-12-09 DIAGNOSIS — C50112 Malignant neoplasm of central portion of left female breast: Secondary | ICD-10-CM

## 2013-12-09 DIAGNOSIS — Z95828 Presence of other vascular implants and grafts: Secondary | ICD-10-CM

## 2013-12-09 DIAGNOSIS — I82409 Acute embolism and thrombosis of unspecified deep veins of unspecified lower extremity: Secondary | ICD-10-CM | POA: Diagnosis not present

## 2013-12-09 LAB — COMPREHENSIVE METABOLIC PANEL (CC13)
ALT: 25 U/L (ref 0–55)
ANION GAP: 9 meq/L (ref 3–11)
AST: 21 U/L (ref 5–34)
Albumin: 3.5 g/dL (ref 3.5–5.0)
Alkaline Phosphatase: 82 U/L (ref 40–150)
BILIRUBIN TOTAL: 0.28 mg/dL (ref 0.20–1.20)
BUN: 21.3 mg/dL (ref 7.0–26.0)
CALCIUM: 9 mg/dL (ref 8.4–10.4)
CO2: 26 meq/L (ref 22–29)
CREATININE: 1.2 mg/dL — AB (ref 0.6–1.1)
Chloride: 106 mEq/L (ref 98–109)
Glucose: 139 mg/dl (ref 70–140)
Potassium: 4.1 mEq/L (ref 3.5–5.1)
Sodium: 141 mEq/L (ref 136–145)
Total Protein: 6.6 g/dL (ref 6.4–8.3)

## 2013-12-09 LAB — CBC WITH DIFFERENTIAL/PLATELET
BASO%: 0.7 % (ref 0.0–2.0)
Basophils Absolute: 0 10*3/uL (ref 0.0–0.1)
EOS%: 3.7 % (ref 0.0–7.0)
Eosinophils Absolute: 0.2 10*3/uL (ref 0.0–0.5)
HCT: 32.5 % — ABNORMAL LOW (ref 34.8–46.6)
HGB: 11.1 g/dL — ABNORMAL LOW (ref 11.6–15.9)
LYMPH%: 21.3 % (ref 14.0–49.7)
MCH: 29.9 pg (ref 25.1–34.0)
MCHC: 34 g/dL (ref 31.5–36.0)
MCV: 87.9 fL (ref 79.5–101.0)
MONO#: 0.5 10*3/uL (ref 0.1–0.9)
MONO%: 8.6 % (ref 0.0–14.0)
NEUT#: 4.1 10*3/uL (ref 1.5–6.5)
NEUT%: 65.7 % (ref 38.4–76.8)
Platelets: 192 10*3/uL (ref 145–400)
RBC: 3.7 10*6/uL (ref 3.70–5.45)
RDW: 13.8 % (ref 11.2–14.5)
WBC: 6.2 10*3/uL (ref 3.9–10.3)
lymph#: 1.3 10*3/uL (ref 0.9–3.3)

## 2013-12-09 LAB — PROTHROMBIN TIME
INR: 1.71 — ABNORMAL HIGH (ref <1.50)
Prothrombin Time: 19.6 seconds — ABNORMAL HIGH (ref 11.6–15.2)

## 2013-12-09 MED ORDER — HEPARIN SOD (PORK) LOCK FLUSH 100 UNIT/ML IV SOLN
500.0000 [IU] | Freq: Once | INTRAVENOUS | Status: AC
  Administered 2013-12-09: 500 [IU] via INTRAVENOUS
  Filled 2013-12-09: qty 5

## 2013-12-09 MED ORDER — SODIUM CHLORIDE 0.9 % IJ SOLN
10.0000 mL | INTRAMUSCULAR | Status: DC | PRN
  Administered 2013-12-09: 10 mL via INTRAVENOUS
  Filled 2013-12-09: qty 10

## 2013-12-09 NOTE — Patient Instructions (Signed)
Implanted Port Instructions  An implanted port is a central line that has a round shape and is placed under the skin. It is used for long-term IV (intravenous) access for:  · Medicine.  · Fluids.  · Liquid nutrition, such as TPN (total parenteral nutrition).  · Blood samples.  Ports can be placed:  · In the chest area just below the collarbone (this is the most common place.)  · In the arms.  · In the belly (abdomen) area.  · In the legs.  PARTS OF THE PORT  A port has 2 main parts:  · The reservoir. The reservoir is round, disc-shaped, and will be a small, raised area under your skin.  · The reservoir is the part where a needle is inserted (accessed) to either give medicines or to draw blood.  · The catheter. The catheter is a long, slender tube that extends from the reservoir. The catheter is placed into a large vein.  · Medicine that is inserted into the reservoir goes into the catheter and then into the vein.  INSERTION OF THE PORT  · The port is surgically placed in either an operating room or in a procedural area (interventional radiology).  · Medicine may be given to help you relax during the procedure.  · The skin where the port will be inserted is numbed (local anesthetic).  · 1 or 2 small cuts (incisions) will be made in the skin to insert the port.  · The port can be used after it has been inserted.  INCISION SITE CARE  · The incision site may have small adhesive strips on it. This helps keep the incision site closed. Sometimes, no adhesive strips are placed. Instead of adhesive strips, a special kind of surgical glue is used to keep the incision closed.  · If adhesive strips were placed on the incision sites, do not take them off. They will fall off on their own.  · The incision site may be sore for 1 to 2 days. Pain medicine can help.  · Do not get the incision site wet. Bathe or shower as directed by your caregiver.  · The incision site should heal in 5 to 7 days. A small scar may form after the  incision has healed.  ACCESSING THE PORT  Special steps must be taken to access the port:  · Before the port is accessed, a numbing cream can be placed on the skin. This helps numb the skin over the port site.  · A sterile technique is used to access the port.  · The port is accessed with a needle. Only "non-coring" port needles should be used to access the port. Once the port is accessed, a blood return should be checked. This helps ensure the port is in the vein and is not clogged (clotted).  · If your caregiver believes your port should remain accessed, a clear (transparent) bandage will be placed over the needle site. The bandage and needle will need to be changed every week or as directed by your caregiver.  · Keep the bandage covering the needle clean and dry. Do not get it wet. Follow your caregiver's instructions on how to take a shower or bath when the port is accessed.  · If your port does not need to stay accessed, no bandage is needed over the port.  FLUSHING THE PORT  Flushing the port keeps it from getting clogged. How often the port is flushed depends on:  · If a   constant infusion is running. If a constant infusion is running, the port may not need to be flushed.  · If intermittent medicines are given.  · If the port is not being used.  For intermittent medicines:  · The port will need to be flushed:  · After medicines have been given.  · After blood has been drawn.  · As part of routine maintenance.  · A port is normally flushed with:  · Normal saline.  · Heparin.  · Follow your caregiver's advice on how often, how much, and the type of flush to use on your port.  IMPORTANT PORT INFORMATION  · Tell your caregiver if you are allergic to heparin.  · After your port is placed, you will get a manufacturer's information card. The card has information about your port. Keep this card with you at all times.  · There are many types of ports available. Know what kind of port you have.  · In case of an  emergency, it may be helpful to wear a medical alert bracelet. This can help alert health care workers that you have a port.  · The port can stay in for as long as your caregiver believes it is necessary.  · When it is time for the port to come out, surgery will be done to remove it. The surgery will be similar to how the port was put in.  · If you are in the hospital or clinic:  · Your port will be taken care of and flushed by a nurse.  · If you are at home:  · A home health care nurse may give medicines and take care of the port.  · You or a family member can get special training and directions for giving medicine and taking care of the port at home.  SEEK IMMEDIATE MEDICAL CARE IF:   · Your port does not flush or you are unable to get a blood return.  · New drainage or pus is coming from the incision.  · A bad smell is coming from the incision site.  · You develop swelling or increased redness at the incision site.  · You develop increased swelling or pain at the port site.  · You develop swelling or pain in the surrounding skin near the port.  · You have an oral temperature above 102° F (38.9° C), not controlled by medicine.  MAKE SURE YOU:   · Understand these instructions.  · Will watch your condition.  · Will get help right away if you are not doing well or get worse.  Document Released: 11/17/2005 Document Revised: 02/09/2012 Document Reviewed: 02/08/2009  Exit[employer]® Patient Information ©2014 [employer].

## 2013-12-16 ENCOUNTER — Encounter: Payer: Self-pay | Admitting: Cardiology

## 2013-12-16 ENCOUNTER — Ambulatory Visit (INDEPENDENT_AMBULATORY_CARE_PROVIDER_SITE_OTHER): Payer: Medicare Other | Admitting: Cardiology

## 2013-12-16 VITALS — BP 150/87 | HR 91 | Ht 61.0 in | Wt 155.0 lb

## 2013-12-16 DIAGNOSIS — I2699 Other pulmonary embolism without acute cor pulmonale: Secondary | ICD-10-CM

## 2013-12-16 DIAGNOSIS — I471 Supraventricular tachycardia: Secondary | ICD-10-CM | POA: Diagnosis not present

## 2013-12-16 DIAGNOSIS — Z9221 Personal history of antineoplastic chemotherapy: Secondary | ICD-10-CM

## 2013-12-16 DIAGNOSIS — C50919 Malignant neoplasm of unspecified site of unspecified female breast: Secondary | ICD-10-CM | POA: Diagnosis not present

## 2013-12-16 MED ORDER — METOPROLOL SUCCINATE ER 50 MG PO TB24: 50.0000 mg | ORAL_TABLET | Freq: Every day | ORAL | Status: DC

## 2013-12-16 NOTE — Patient Instructions (Signed)
Your physician recommends that you continue on your current medications as directed. Please refer to the Current Medication list given to you today.  Your physician wants you to follow-up in: 6 months with Dr. Magdalen Spatz. You will receive a reminder letter in the mail two months in advance. If you don't receive a letter, please call our office to schedule the follow-up appointment.

## 2013-12-16 NOTE — Progress Notes (Signed)
[facility]. [address], [address], [place]  [address] Phone: [phone] Fax:  [phone]  Date:  12/16/2013   ID:  Jessica Wells, DOB 12-Aug-1946, MRN [id number]  PCP:  Gerrit Heck, MD   History of Present Illness: Jessica Wells is a 68 y.o. female with history of paroxysmal atrial tachycardia, DVT, inflammatory right-sided breast cancer on chronic anticoagulation, here for followup. Her last visit with me was 12/19/10. Was seeing Dr. Haroldine Laws. Toprol working well. Previously on Herceptin.   Wt Readings from Last 3 Encounters:  12/16/13 155 lb (70.308 kg)  11/21/13 156 lb 6.4 oz (70.943 kg)  10/14/13 150 lb 9.6 oz (68.312 kg)     Past Medical History  Diagnosis Date  . GERD (gastroesophageal reflux disease)   . Dysrhythmia     PAT-sees dr Lina Sayre meds  . Allergy   . PAT (paroxysmal atrial tachycardia)     hx  . Arthritis     osteopenia,knees  . PONV (postoperative nausea and vomiting) 09-13-12    severe, with Port-a-cath, was managed without PONV  . Clotting disorder     prothrombin gene mutation-heterozygous   . History of chemotherapy     Last dose to be 02-04-13.  Was rx'd with Herceptin & Gemzar  . DVT (deep venous thrombosis)   . Asthma     Uses Inhalers Proventil as needed  . Bursitis 01-25-13    Rt. shoulder- mildly affected now.  . History of blood transfusion 01-25-13    2 units 01-11-13  . Pneumonia 01-25-13    hx. 12-27-12-hospital stay x 9 days, now resolved.  . S/P radiation therapy 4-12 wks ago 03/31/13-05/17/13    right breast/supraclavicular fossa/posterior axillary boost  . Breast cancer 07/14/12    inflammatory right breast ca, ER/PR -    Past Surgical History  Procedure Laterality Date  . Dilation and curettage of uterus      x3  . Tonsillectomy    . Colonoscopy    . Portacath placement  07/21/2012    Procedure: INSERTION PORT-A-CATH;  Surgeon: Rolm Bookbinder, MD;  Location: [facility];  Service: General;   Laterality: Left;/ Replacement done 10'13  . Insertion of vena cava filter  12/2012  . Giant cell tumor  01-25-13    removed Rt. forearm.  . Eye lid surgery      right( MD office)  . Mastectomy modified radical Right 02/08/2013    Procedure: MASTECTOMY MODIFIED RADICAL;  Surgeon: Rolm Bookbinder, MD;  Location: [facility];  Service: General;  Laterality: Right;  . Breast surgery      Current Outpatient Prescriptions  Medication Sig Dispense Refill  . b complex vitamins tablet Take 1 tablet by mouth daily.      . calcium-vitamin D (OSCAL WITH D) 500-200 MG-UNIT per tablet Take 1 tablet by mouth every other day.       . Ferrous Sulfate (SLOW FE PO) Take 1 tablet by mouth every other day.      . fish oil-omega-3 fatty acids 1000 MG capsule Take 2 g by mouth daily.      . Flaxseed, Linseed, (FLAX SEEDS PO) Take 1 tablet by mouth daily.      Marland Kitchen glucosamine-chondroitin 500-400 MG tablet Take 1 tablet by mouth daily.      . metoprolol succinate (TOPROL-XL) 50 MG 24 hr tablet Take 1 tablet (50 mg total) by mouth daily before breakfast. Take with or immediately following a meal.  30 tablet  3  . Multiple Vitamin (MULTIVITAMIN WITH MINERALS) TABS Take 1 tablet by mouth every other day.       Marland Kitchen omeprazole (PRILOSEC) 20 MG capsule Take 20 mg by mouth daily. For heartburn      . PROVENTIL HFA 108 (90 BASE) MCG/ACT inhaler Inhale 2 puffs into the lungs every 4 (four) hours as needed.       . warfarin (COUMADIN) 5 MG tablet Take one 5 mg tablet by mouth on Tuesday and Friday and 7.5 mg all other days or as directed by physician.  60 tablet  1  . warfarin (COUMADIN) 5 MG tablet TAKE ONE AND ONE HALF TABLETS BY MOUTH ON TUESDAYS, THURSDAYS AND SATURDAYS. TAKE ONE TABLET ON MONDAYS, WEDNESDAYS, FRIDAYS AND SUNDAYS.  30 tablet  0   No current facility-administered medications for this visit.    Allergies:    Allergies  Allergen Reactions  . Anesthetics, Amide Nausea And Vomiting    Projectile  vomitting-Nausea- with 24hrs of dry heaves. With any anesthetics  . Bactrim [Sulfamethoxazole-Trimethoprim] Other (See Comments)    Massive diarrhea, nausea vomiting, platelets dropped, hemoglobin dropped, admitted to hospital  . Sulfa Antibiotics Nausea And Vomiting and Other (See Comments)    Crazy feeling in head Life threatening reaction, decreased blood counts  . Codeine Nausea And Vomiting  . Codeine Phosphate Nausea And Vomiting  . Eggs Or Egg-Derived Products Hives    Social History:  The patient  reports that she quit smoking about 34 years ago. Her smoking use included Cigarettes. She has a 35 pack-year smoking history. She has never used smokeless tobacco. She reports that she does not drink alcohol or use illicit drugs.   Family History  Problem Relation Age of Onset  . Diabetes Mother   . Heart disease Mother   . Hypertension Mother   . Hyperlipidemia Mother   . Breast cancer Mother 79    lobular breast cancer  . Heart disease Maternal Aunt   . Heart disease Maternal Uncle   . Heart disease Maternal Grandmother     died in her 88s from heart disease  . Lung cancer Father 59    adenocarcinoma  . Cancer Paternal Uncle     dx in mid 27s with a cancer in the leg, died late 78s; grandpaternal half uncle    ROS:  Please see the history of present illness.   No syncope, no excessive palpitations. Recent sinus infection, taking albuterol. No chest pain.   All other systems reviewed and negative.   PHYSICAL EXAM: VS:  BP 150/87  Pulse 91  Ht 5\' 1"  (1.549 m)  Wt 155 lb (70.308 kg)  BMI 29.30 kg/m2 Well nourished, well developed, in no acute distress HEENT: normal, [place]/AT, EOMI Neck: no JVD, normal carotid upstroke, no bruit Cardiac:  normal S1, S2; RRR; no murmur Lungs:  clear to auscultation bilaterally, no wheezing, rhonchi or rales Abd: soft, nontender, no hepatomegaly, no bruits Ext: no edema, 2+ distal pulses Skin: warm and dry GU: deferred Neuro: no focal  abnormalities noted, AAO x 3  EKG:  NSR 86.  ECHOS:  07/22/12 EF 60-65% lateral s' 10.5  10/2012 EF 60-65% lateral s' 10.7  01/19/13 EF 55-60% lateral S' 13.0 No evidence of RV strain.  03/21/13 EF 55-60% lateral s' 9.8 (remeasured by me today) Global Strain -16  06/20/13 EF 55-60% Lateral S' 9.3 Global Strain -24  09/21/13 EF 60% Lateral s' 9.9 Global strain -19%  ASSESSMENT AND PLAN:  1.  Supraventricular tachycardia-PAT-doing very well. Continue with Toprol. She is enjoying this. If they become more frequent, consider ablative therapy. We discussed this once again. 2. Chronic anticoagulation-warfarin-could consider Eliquis or Xarelto in the future. IVC filter.  3. Breast cancer-recently saw Dr. Donne Hazel. Doing well. Right-sided. 4. History of pulmonary embolism-IVC filter. Coumadin. 5. We will see back in 6 months.  Signed, Candee Furbish, MD [facility]  12/16/2013 3:44 PM

## 2013-12-23 ENCOUNTER — Other Ambulatory Visit: Payer: Self-pay | Admitting: Oncology

## 2013-12-26 ENCOUNTER — Other Ambulatory Visit: Payer: Self-pay | Admitting: Oncology

## 2014-01-06 ENCOUNTER — Telehealth: Payer: Self-pay | Admitting: Oncology

## 2014-01-06 ENCOUNTER — Telehealth: Payer: Self-pay | Admitting: *Deleted

## 2014-01-06 ENCOUNTER — Other Ambulatory Visit (HOSPITAL_BASED_OUTPATIENT_CLINIC_OR_DEPARTMENT_OTHER): Payer: Medicare Other

## 2014-01-06 ENCOUNTER — Ambulatory Visit (HOSPITAL_BASED_OUTPATIENT_CLINIC_OR_DEPARTMENT_OTHER): Payer: Medicare Other | Admitting: Adult Health

## 2014-01-06 VITALS — BP 134/79 | HR 80 | Temp 98.3°F | Resp 19 | Ht 61.0 in | Wt 152.5 lb

## 2014-01-06 DIAGNOSIS — I2699 Other pulmonary embolism without acute cor pulmonale: Secondary | ICD-10-CM | POA: Diagnosis not present

## 2014-01-06 DIAGNOSIS — Z95828 Presence of other vascular implants and grafts: Secondary | ICD-10-CM

## 2014-01-06 DIAGNOSIS — Z7901 Long term (current) use of anticoagulants: Secondary | ICD-10-CM

## 2014-01-06 DIAGNOSIS — I82409 Acute embolism and thrombosis of unspecified deep veins of unspecified lower extremity: Secondary | ICD-10-CM | POA: Diagnosis not present

## 2014-01-06 DIAGNOSIS — C50119 Malignant neoplasm of central portion of unspecified female breast: Secondary | ICD-10-CM

## 2014-01-06 DIAGNOSIS — C50919 Malignant neoplasm of unspecified site of unspecified female breast: Secondary | ICD-10-CM | POA: Diagnosis not present

## 2014-01-06 DIAGNOSIS — Z171 Estrogen receptor negative status [ER-]: Secondary | ICD-10-CM | POA: Diagnosis not present

## 2014-01-06 DIAGNOSIS — C50112 Malignant neoplasm of central portion of left female breast: Secondary | ICD-10-CM

## 2014-01-06 DIAGNOSIS — R609 Edema, unspecified: Secondary | ICD-10-CM | POA: Diagnosis not present

## 2014-01-06 LAB — CBC WITH DIFFERENTIAL/PLATELET
BASO%: 1.1 % (ref 0.0–2.0)
Basophils Absolute: 0.1 10*3/uL (ref 0.0–0.1)
EOS%: 4.2 % (ref 0.0–7.0)
Eosinophils Absolute: 0.3 10*3/uL (ref 0.0–0.5)
HCT: 34.9 % (ref 34.8–46.6)
HGB: 11.7 g/dL (ref 11.6–15.9)
LYMPH#: 1.4 10*3/uL (ref 0.9–3.3)
LYMPH%: 23.3 % (ref 14.0–49.7)
MCH: 29.6 pg (ref 25.1–34.0)
MCHC: 33.6 g/dL (ref 31.5–36.0)
MCV: 88.1 fL (ref 79.5–101.0)
MONO#: 0.5 10*3/uL (ref 0.1–0.9)
MONO%: 8.7 % (ref 0.0–14.0)
NEUT%: 62.7 % (ref 38.4–76.8)
NEUTROS ABS: 3.8 10*3/uL (ref 1.5–6.5)
PLATELETS: 212 10*3/uL (ref 145–400)
RBC: 3.96 10*6/uL (ref 3.70–5.45)
RDW: 14.3 % (ref 11.2–14.5)
WBC: 6 10*3/uL (ref 3.9–10.3)

## 2014-01-06 LAB — COMPREHENSIVE METABOLIC PANEL (CC13)
ALT: 23 U/L (ref 0–55)
AST: 21 U/L (ref 5–34)
Albumin: 3.7 g/dL (ref 3.5–5.0)
Alkaline Phosphatase: 76 U/L (ref 40–150)
Anion Gap: 8 mEq/L (ref 3–11)
BUN: 17.9 mg/dL (ref 7.0–26.0)
CALCIUM: 9.6 mg/dL (ref 8.4–10.4)
CO2: 27 meq/L (ref 22–29)
CREATININE: 1 mg/dL (ref 0.6–1.1)
Chloride: 104 mEq/L (ref 98–109)
Glucose: 98 mg/dl (ref 70–140)
Potassium: 4 mEq/L (ref 3.5–5.1)
Sodium: 140 mEq/L (ref 136–145)
Total Bilirubin: 0.24 mg/dL (ref 0.20–1.20)
Total Protein: 6.7 g/dL (ref 6.4–8.3)

## 2014-01-06 LAB — PROTIME-INR
INR: 2.5 (ref 2.00–3.50)
Protime: 30 Seconds — ABNORMAL HIGH (ref 10.6–13.4)

## 2014-01-06 MED ORDER — SODIUM CHLORIDE 0.9 % IJ SOLN
10.0000 mL | INTRAMUSCULAR | Status: DC | PRN
  Administered 2014-01-06: 10 mL via INTRAVENOUS
  Filled 2014-01-06: qty 10

## 2014-01-06 MED ORDER — HEPARIN SOD (PORK) LOCK FLUSH 100 UNIT/ML IV SOLN
500.0000 [IU] | Freq: Once | INTRAVENOUS | Status: AC
  Administered 2014-01-06: 500 [IU] via INTRAVENOUS
  Filled 2014-01-06: qty 5

## 2014-01-06 NOTE — Progress Notes (Signed)
[facility]  [phone] [phone]  OFFICE PROGRESS NOTE   PATIENT: Jessica Wells   DOB: 01/26/1946  MR#: [id number]  QIH#:[id number]   OV:FIEPPI,RJJOACZYS STEWART, MD Lurline Hare, MD Emelia Loron, MD Arvilla Meres, MD Charlaine Dalton. Sherene Sires, MD    DIAGNOSIS:  68 year-old [place], [place] woman with a recent diagnosis of inflammatory breast cancer.   PRIOR THERAPY: 1.  The patient was originally seen in clinic for new diagnosis of inflammatory breast cancer, she was referred by Dr. Emelia Loron. She had a mammogram performed on 07/14/2012 that showed an abnormality. That was biopsied and it showed an invasive mammary carcinoma with lymphovascular invasion grade 3 ER negative PR negative HER-2/neu positive.   2.  The patient had a MRI of the breasts performed on 07/19/2012.  The MRI showed diffuse right breast neoplasm on a large level I right axillary lymph nodes compatible with lymphatic spread.  The patient has had a biopsy of the right axillary lymph node that is compatible with invasive mammary carcinoma.   3.  The patient began neoadjuvant FEC 100 with day 2 Neulasta support on 07/23/2012.   4.   Weekly neoadjuvant Taxol and Herceptin started on 09/17/12.  The patient developed neuropathies, and Taxol was discontinued early.  She completed 7 weeks of Taxol/Herceptin combination therapy.  The patient then received 2 weeks of Herceptin only.   5. Weekly Gemcitabine and Herceptin was started on 11/19/12 and completed on 12/17/2012 after 5 cycles.  6.  A regimen of Lasix 20 mg PO and Kdur 10 mEq was started on 01/07/2013 due to chronic lower extremity edema.  Medications are only to be taken if patient has a weight gain of 5 lbs or more in a day (daily weights).   7.  Anemia was treated with blood transfusions of 1 unit of PRBCs given on 01/11/2013 and another unit of PRBC's given 01/12/2013.  8. Patient underwent right mastectomy on  02/08/13.  Pathology showed a complete response to chemotherapy in the breast and 11 nodes identified with fibrosis but no carcinoma.    9.  Radiation therapy from 03/31/13 through 05/17/13.  10.  S/P Every 3 week herceptin from 02/25/13 through 10/14/13.    CURRENT THERAPY:  Observation  INTERVAL HISTORY: Ms. Dantes is doing well today.  She is here for f/u for her h/o inflammatory breast cancer.  She is doing very well.  She continues to take Coumadin and is tolerating it well with no easy bruising/bleeding.  She denies any new pain, night sweats, unintnentional weight loss, or further concerns.  She did recently f/u with Dr. Dwain Sarna and would like to have follow up CT scans in 4-6 months in addition to switch her anticoagulant therapy to apixiban as that is what Dr. Gala Romney recommended.  Otherwise, a 10 point ROS is negative.    PAST MEDICAL HISTORY: Past Medical History  Diagnosis Date  . GERD (gastroesophageal reflux disease)   . Dysrhythmia     PAT-sees dr Laurence Compton meds  . Allergy   . PAT (paroxysmal atrial tachycardia)     hx  . Arthritis     osteopenia,knees  . PONV (postoperative nausea and vomiting) 09-13-12    severe, with Port-a-cath, was managed without PONV  . Clotting disorder     prothrombin gene mutation-heterozygous   . History of chemotherapy     Last dose to be 02-04-13.  Was rx'd with Herceptin & Gemzar  . DVT (deep venous thrombosis)   .  Asthma     Uses Inhalers Proventil as needed  . Bursitis 01-25-13    Rt. shoulder- mildly affected now.  . History of blood transfusion 01-25-13    2 units 01-11-13  . Pneumonia 01-25-13    hx. 12-27-12-hospital stay x 9 days, now resolved.  . S/P radiation therapy 4-12 wks ago 03/31/13-05/17/13    right breast/supraclavicular fossa/posterior axillary boost  . Breast cancer 07/14/12    inflammatory right breast ca, ER/PR -   PAST SURGICAL HISTORY: Past Surgical History  Procedure Laterality Date  . Dilation and curettage of  uterus      x3  . Tonsillectomy    . Colonoscopy    . Portacath placement  07/21/2012    Procedure: INSERTION PORT-A-CATH;  Surgeon: Rolm Bookbinder, MD;  Location: [facility];  Service: General;  Laterality: Left;/ Replacement done 10'13  . Insertion of vena cava filter  12/2012  . Giant cell tumor  01-25-13    removed Rt. forearm.  . Eye lid surgery      right( MD office)  . Mastectomy modified radical Right 02/08/2013    Procedure: MASTECTOMY MODIFIED RADICAL;  Surgeon: Rolm Bookbinder, MD;  Location: [facility];  Service: General;  Laterality: Right;  . Breast surgery      FAMILY HISTORY: Family History  Problem Relation Age of Onset  . Diabetes Mother   . Heart disease Mother   . Hypertension Mother   . Hyperlipidemia Mother   . Breast cancer Mother 21    lobular breast cancer  . Heart disease Maternal Aunt   . Heart disease Maternal Uncle   . Heart disease Maternal Grandmother     died in her 81s from heart disease  . Lung cancer Father 54    adenocarcinoma  . Cancer Paternal Uncle     dx in mid 46s with a cancer in the leg, died late 71s; grandpaternal half uncle    SOCIAL HISTORY: History  Substance Use Topics  . Smoking status: Former Smoker -- 1.00 packs/day for 35 years    Types: Cigarettes    Quit date: 07/21/1979  . Smokeless tobacco: Never Used  . Alcohol Use: No    ALLERGIES: Allergies  Allergen Reactions  . Anesthetics, Amide Nausea And Vomiting    Projectile vomitting-Nausea- with 24hrs of dry heaves. With any anesthetics  . Bactrim [Sulfamethoxazole-Trimethoprim] Other (See Comments)    Massive diarrhea, nausea vomiting, platelets dropped, hemoglobin dropped, admitted to hospital  . Sulfa Antibiotics Nausea And Vomiting and Other (See Comments)    Crazy feeling in head Life threatening reaction, decreased blood counts  . Codeine Nausea And Vomiting  . Codeine Phosphate Nausea And Vomiting  . Eggs Or Egg-Derived Products Hives      MEDICATIONS:  Current Outpatient Prescriptions  Medication Sig Dispense Refill  . b complex vitamins tablet Take 1 tablet by mouth daily.      . calcium-vitamin D (OSCAL WITH D) 500-200 MG-UNIT per tablet Take 1 tablet by mouth every other day.       . Ferrous Sulfate (SLOW FE PO) Take 1 tablet by mouth every other day.      . fish oil-omega-3 fatty acids 1000 MG capsule Take 2 g by mouth daily.      . Flaxseed, Linseed, (FLAX SEEDS PO) Take 1 tablet by mouth daily.      Marland Kitchen glucosamine-chondroitin 500-400 MG tablet Take 1 tablet by mouth daily.      . metoprolol succinate (TOPROL-XL) 50  MG 24 hr tablet Take 1 tablet (50 mg total) by mouth daily before breakfast. Take with or immediately following a meal.  30 tablet  5  . Multiple Vitamin (MULTIVITAMIN WITH MINERALS) TABS Take 1 tablet by mouth every other day.       Marland Kitchen omeprazole (PRILOSEC) 20 MG capsule Take 20 mg by mouth daily. For heartburn      . PROVENTIL HFA 108 (90 BASE) MCG/ACT inhaler Inhale 2 puffs into the lungs every 4 (four) hours as needed.       . warfarin (COUMADIN) 5 MG tablet Take one 5 mg tablet by mouth on Tuesday and Friday and 7.5 mg all other days or as directed by physician.  60 tablet  1  . warfarin (COUMADIN) 5 MG tablet 7.$RemoveBef'5mg'mUIPZpbcGW$  5 days per week, then $RemoveBe'5mg'OovrDzJvE$  2 days per week.       Current Facility-Administered Medications  Medication Dose Route Frequency Provider Last Rate Last Dose  . sodium chloride 0.9 % injection 10 mL  10 mL Intravenous PRN Deatra Robinson, MD   10 mL at 01/06/14 1513      REVIEW OF SYSTEMS: A 10 point review of systems was completed and is negative except as noted above.    PHYSICAL EXAMINATION: BP 134/79  Pulse 80  Temp(Src) 98.3 F (36.8 C) (Oral)  Resp 19  Ht $R'5\' 1"'Na$  (1.549 m)  Wt 152 lb 8 oz (69.174 kg)  BMI 28.83 kg/m2    GENERAL: Patient is a well appearing female in no acute distress HEENT:  Sclerae anicteric.  Oropharynx clear and moist. No ulcerations or evidence of  oropharyngeal candidiasis. Neck is supple.  NODES:  No cervical, supraclavicular, or axillary lymphadenopathy palpated.  BREAST EXAM:  right mastectomy site with no nodularity or sign of recurrence, left breast no masses or nodules LUNGS:  Clear to auscultation bilaterally.  No wheezes or rhonchi. HEART:  Regular rate and rhythm. No murmur appreciated. ABDOMEN:  Soft, nontender.  Positive, normoactive bowel sounds. No organomegaly palpated. MSK:  No focal spinal tenderness to palpation. Full range of motion bilaterally in the upper extremities. EXTREMITIES:  No peripheral edema.   SKIN:  Clear with no obvious rashes or skin changes. No nail dyscrasia. NEURO:  Nonfocal. Well oriented.  Appropriate affect. ECOG FS:  Grade 1  LAB RESULTS: Lab Results  Component Value Date   WBC 6.0 01/06/2014   NEUTROABS 3.8 01/06/2014   HGB 11.7 01/06/2014   HCT 34.9 01/06/2014   MCV 88.1 01/06/2014   PLT 212 01/06/2014      Chemistry      Component Value Date/Time   NA 140 01/06/2014 1507   NA 140 02/10/2013 0400   K 4.0 01/06/2014 1507   K 3.4* 02/10/2013 0400   CL 105 05/20/2013 1459   CL 106 02/10/2013 0400   CO2 27 01/06/2014 1507   CO2 26 02/10/2013 0400   BUN 17.9 01/06/2014 1507   BUN 11 02/10/2013 0400   CREATININE 1.0 01/06/2014 1507   CREATININE 0.84 02/10/2013 0400      Component Value Date/Time   CALCIUM 9.6 01/06/2014 1507   CALCIUM 8.7 02/10/2013 0400   ALKPHOS 76 01/06/2014 1507   ALKPHOS 66 01/28/2013 1630   AST 21 01/06/2014 1507   AST 36 01/28/2013 1630   ALT 23 01/06/2014 1507   ALT 55* 01/28/2013 1630   BILITOT 0.24 01/06/2014 1507   BILITOT 0.2* 01/28/2013 1630      Lab Results  Component Value Date  LABCA2 16 07/19/2012     RADIOGRAPHIC STUDIES: No results found.  ASSESSMENT: 68 y.o. [place], [place] woman with:  1.  Inflammatory right breast cancer with positive lymph nodes the tumor is ER negative PR negative HER-2/neu positive.  Patient is s/p neoadjuvant chemotherapy consisting of FEC q 2  weeks x 4 cycles.  Status post weekly Gemcitabine and Herceptin that was started on 11/19/12 and completed on 12/17/2012 after 5 cycles.   Herceptin Qweekly x 12 weeks started on 01/14/2013.  Patient underwent right mastectomy by Dr. Donne Hazel on 02/08/13 with complete pathologic response to neoadjuvant chemotherapy. Adjuvant Herceptin every 3 weeks started on 02/25/13, and radiation therapy with Dr. Pablo Ledger was received from 03/31/13 through 05/17/13.   2.  Bilateral lower extremity DVT and Bilateral PE.  The patient has a positive Prothrombin Gene Mutation, heterozygous. S/p IVC filter inserted on  12/27/2012. She will continue on Coumadin and take 7.5 mg five nights per week and 87m two nights per week. Her INR is stable.    3.  Lower extremity edema-near resolved today.  4.  Right thumb acute paronychia: Resolved.    PLAN:  1. Patient is doing well today. She has no sign of recurrence.    2.  She continues to take coumadin daily.  Her INR is stable.  She will continue her current dose.    3.  She will have her port flushed monthly, and I have set up for her INR to be drawn as well.  We will see her back in 3 months to discuss changing her anticoagulant therapy, repeating scans, removing her port, and her decision about breast reduction.    All questions were answered.  The patient was encouraged to contact [facility] the interim with any problems, questions or concerns.   I spent 25 minutes counseling the patient face to face.  The total time spent in the appointment was 30 minutes.  LMinette Headland [phone]/05/2014, 3:32 PM

## 2014-01-06 NOTE — Patient Instructions (Signed)

## 2014-01-06 NOTE — Telephone Encounter (Signed)
, °

## 2014-01-06 NOTE — Telephone Encounter (Signed)
Called pt to inform her of INR results(2.50). Pt will continue on current dose as ordered per Charlestine Massed, NP. Pt verbalized understanding . No further concerns. Message to be forwarded to NP.

## 2014-01-07 ENCOUNTER — Encounter: Payer: Self-pay | Admitting: Adult Health

## 2014-01-10 ENCOUNTER — Telehealth: Payer: Self-pay | Admitting: *Deleted

## 2014-01-10 NOTE — Telephone Encounter (Signed)
sw pt informed her that her appts for 03/31/14 changed. gv appts for 03/31/14 w/ labs@ 12 noon, flush @12 :30pm and ov@ 1pm. Pt is aware...td

## 2014-02-03 ENCOUNTER — Ambulatory Visit (HOSPITAL_BASED_OUTPATIENT_CLINIC_OR_DEPARTMENT_OTHER): Payer: Medicare Other

## 2014-02-03 ENCOUNTER — Other Ambulatory Visit (HOSPITAL_BASED_OUTPATIENT_CLINIC_OR_DEPARTMENT_OTHER): Payer: Medicare Other

## 2014-02-03 VITALS — BP 126/65 | HR 69 | Temp 98.1°F

## 2014-02-03 DIAGNOSIS — Z95828 Presence of other vascular implants and grafts: Secondary | ICD-10-CM

## 2014-02-03 DIAGNOSIS — Z86711 Personal history of pulmonary embolism: Secondary | ICD-10-CM

## 2014-02-03 DIAGNOSIS — C50919 Malignant neoplasm of unspecified site of unspecified female breast: Secondary | ICD-10-CM

## 2014-02-03 DIAGNOSIS — Z86718 Personal history of other venous thrombosis and embolism: Secondary | ICD-10-CM | POA: Diagnosis not present

## 2014-02-03 DIAGNOSIS — Z7901 Long term (current) use of anticoagulants: Secondary | ICD-10-CM

## 2014-02-03 DIAGNOSIS — C50112 Malignant neoplasm of central portion of left female breast: Secondary | ICD-10-CM

## 2014-02-03 LAB — COMPREHENSIVE METABOLIC PANEL (CC13)
ALT: 21 U/L (ref 0–55)
ANION GAP: 11 meq/L (ref 3–11)
AST: 19 U/L (ref 5–34)
Albumin: 3.7 g/dL (ref 3.5–5.0)
Alkaline Phosphatase: 71 U/L (ref 40–150)
BUN: 24.8 mg/dL (ref 7.0–26.0)
CALCIUM: 9.3 mg/dL (ref 8.4–10.4)
CHLORIDE: 105 meq/L (ref 98–109)
CO2: 27 meq/L (ref 22–29)
Creatinine: 1.1 mg/dL (ref 0.6–1.1)
Glucose: 129 mg/dl (ref 70–140)
Potassium: 3.8 mEq/L (ref 3.5–5.1)
Sodium: 143 mEq/L (ref 136–145)
Total Bilirubin: 0.22 mg/dL (ref 0.20–1.20)
Total Protein: 6.8 g/dL (ref 6.4–8.3)

## 2014-02-03 LAB — CBC WITH DIFFERENTIAL/PLATELET
BASO%: 0.9 % (ref 0.0–2.0)
BASOS ABS: 0.1 10*3/uL (ref 0.0–0.1)
EOS%: 5.5 % (ref 0.0–7.0)
Eosinophils Absolute: 0.3 10*3/uL (ref 0.0–0.5)
HEMATOCRIT: 34.8 % (ref 34.8–46.6)
HGB: 11.7 g/dL (ref 11.6–15.9)
LYMPH%: 24.5 % (ref 14.0–49.7)
MCH: 29.6 pg (ref 25.1–34.0)
MCHC: 33.7 g/dL (ref 31.5–36.0)
MCV: 87.8 fL (ref 79.5–101.0)
MONO#: 0.4 10*3/uL (ref 0.1–0.9)
MONO%: 7.8 % (ref 0.0–14.0)
NEUT#: 3.5 10*3/uL (ref 1.5–6.5)
NEUT%: 61.3 % (ref 38.4–76.8)
Platelets: 193 10*3/uL (ref 145–400)
RBC: 3.96 10*6/uL (ref 3.70–5.45)
RDW: 13.7 % (ref 11.2–14.5)
WBC: 5.7 10*3/uL (ref 3.9–10.3)
lymph#: 1.4 10*3/uL (ref 0.9–3.3)

## 2014-02-03 LAB — PROTIME-INR
INR: 3.8 — ABNORMAL HIGH (ref 2.00–3.50)
Protime: 45.6 Seconds — ABNORMAL HIGH (ref 10.6–13.4)

## 2014-02-03 MED ORDER — HEPARIN SOD (PORK) LOCK FLUSH 100 UNIT/ML IV SOLN
500.0000 [IU] | Freq: Once | INTRAVENOUS | Status: AC
Start: 2014-02-03 — End: 2014-02-03
  Administered 2014-02-03: 500 [IU] via INTRAVENOUS
  Filled 2014-02-03: qty 5

## 2014-02-03 MED ORDER — SODIUM CHLORIDE 0.9 % IJ SOLN
10.0000 mL | INTRAMUSCULAR | Status: DC | PRN
  Administered 2014-02-03: 10 mL via INTRAVENOUS
  Filled 2014-02-03: qty 10

## 2014-02-06 ENCOUNTER — Other Ambulatory Visit: Payer: Self-pay | Admitting: *Deleted

## 2014-02-06 DIAGNOSIS — C50119 Malignant neoplasm of central portion of unspecified female breast: Secondary | ICD-10-CM

## 2014-02-06 MED ORDER — WARFARIN SODIUM 5 MG PO TABS: ORAL_TABLET | ORAL | Status: DC

## 2014-02-22 DIAGNOSIS — J45901 Unspecified asthma with (acute) exacerbation: Secondary | ICD-10-CM | POA: Diagnosis not present

## 2014-03-03 ENCOUNTER — Ambulatory Visit: Payer: Medicare Other

## 2014-03-03 ENCOUNTER — Other Ambulatory Visit (HOSPITAL_BASED_OUTPATIENT_CLINIC_OR_DEPARTMENT_OTHER): Payer: Medicare Other

## 2014-03-03 VITALS — BP 114/67 | HR 75 | Temp 98.5°F | Resp 18

## 2014-03-03 DIAGNOSIS — C50919 Malignant neoplasm of unspecified site of unspecified female breast: Secondary | ICD-10-CM | POA: Diagnosis not present

## 2014-03-03 DIAGNOSIS — Z86711 Personal history of pulmonary embolism: Secondary | ICD-10-CM | POA: Diagnosis not present

## 2014-03-03 DIAGNOSIS — Z7901 Long term (current) use of anticoagulants: Secondary | ICD-10-CM

## 2014-03-03 DIAGNOSIS — Z86718 Personal history of other venous thrombosis and embolism: Secondary | ICD-10-CM | POA: Diagnosis not present

## 2014-03-03 DIAGNOSIS — C50112 Malignant neoplasm of central portion of left female breast: Secondary | ICD-10-CM

## 2014-03-03 DIAGNOSIS — I82409 Acute embolism and thrombosis of unspecified deep veins of unspecified lower extremity: Secondary | ICD-10-CM

## 2014-03-03 LAB — CBC WITH DIFFERENTIAL/PLATELET
BASO%: 0.6 % (ref 0.0–2.0)
BASOS ABS: 0 10*3/uL (ref 0.0–0.1)
EOS%: 2.3 % (ref 0.0–7.0)
Eosinophils Absolute: 0.2 10*3/uL (ref 0.0–0.5)
HEMATOCRIT: 34.5 % — AB (ref 34.8–46.6)
HEMOGLOBIN: 11.7 g/dL (ref 11.6–15.9)
LYMPH%: 20.1 % (ref 14.0–49.7)
MCH: 29.8 pg (ref 25.1–34.0)
MCHC: 33.8 g/dL (ref 31.5–36.0)
MCV: 88.3 fL (ref 79.5–101.0)
MONO#: 0.6 10*3/uL (ref 0.1–0.9)
MONO%: 8.6 % (ref 0.0–14.0)
NEUT#: 4.7 10*3/uL (ref 1.5–6.5)
NEUT%: 68.4 % (ref 38.4–76.8)
PLATELETS: 176 10*3/uL (ref 145–400)
RBC: 3.91 10*6/uL (ref 3.70–5.45)
RDW: 13.8 % (ref 11.2–14.5)
WBC: 6.8 10*3/uL (ref 3.9–10.3)
lymph#: 1.4 10*3/uL (ref 0.9–3.3)

## 2014-03-03 LAB — COMPREHENSIVE METABOLIC PANEL (CC13)
ALBUMIN: 3.5 g/dL (ref 3.5–5.0)
ALT: 18 U/L (ref 0–55)
ANION GAP: 8 meq/L (ref 3–11)
AST: 17 U/L (ref 5–34)
Alkaline Phosphatase: 66 U/L (ref 40–150)
BUN: 22.9 mg/dL (ref 7.0–26.0)
CALCIUM: 9 mg/dL (ref 8.4–10.4)
CHLORIDE: 105 meq/L (ref 98–109)
CO2: 25 meq/L (ref 22–29)
CREATININE: 1 mg/dL (ref 0.6–1.1)
Glucose: 121 mg/dl (ref 70–140)
POTASSIUM: 4 meq/L (ref 3.5–5.1)
Sodium: 139 mEq/L (ref 136–145)
Total Bilirubin: 0.31 mg/dL (ref 0.20–1.20)
Total Protein: 6.3 g/dL — ABNORMAL LOW (ref 6.4–8.3)

## 2014-03-03 LAB — PROTIME-INR
INR: 2.6 (ref 2.00–3.50)
Protime: 31.2 Seconds — ABNORMAL HIGH (ref 10.6–13.4)

## 2014-03-03 MED ORDER — SODIUM CHLORIDE 0.9 % IJ SOLN
10.0000 mL | INTRAMUSCULAR | Status: DC | PRN
  Administered 2014-03-03: 10 mL via INTRAVENOUS
  Filled 2014-03-03: qty 10

## 2014-03-03 MED ORDER — HEPARIN SOD (PORK) LOCK FLUSH 100 UNIT/ML IV SOLN
500.0000 [IU] | Freq: Once | INTRAVENOUS | Status: AC
  Administered 2014-03-03: 500 [IU] via INTRAVENOUS
  Filled 2014-03-03: qty 5

## 2014-03-03 NOTE — Patient Instructions (Signed)

## 2014-03-22 DIAGNOSIS — I890 Lymphedema, not elsewhere classified: Secondary | ICD-10-CM | POA: Diagnosis not present

## 2014-03-22 DIAGNOSIS — J309 Allergic rhinitis, unspecified: Secondary | ICD-10-CM | POA: Diagnosis not present

## 2014-03-22 DIAGNOSIS — J45901 Unspecified asthma with (acute) exacerbation: Secondary | ICD-10-CM | POA: Diagnosis not present

## 2014-03-22 DIAGNOSIS — R05 Cough: Secondary | ICD-10-CM | POA: Diagnosis not present

## 2014-03-22 DIAGNOSIS — R059 Cough, unspecified: Secondary | ICD-10-CM | POA: Diagnosis not present

## 2014-03-22 DIAGNOSIS — C50919 Malignant neoplasm of unspecified site of unspecified female breast: Secondary | ICD-10-CM | POA: Diagnosis not present

## 2014-03-23 ENCOUNTER — Telehealth: Payer: Self-pay | Admitting: Oncology

## 2014-03-23 NOTE — Telephone Encounter (Signed)
LVM ADVISING PT TO DISREGARD PRIOR MSG WITH APPT 4/28. IN NEW VM I LEFT MSG ADVISING PT SHE NEEDS TO BE SCHEDULED WITH LINDSEY GAVE NEXT AVAIL 5/5 @ 1.45PM

## 2014-03-23 NOTE — Telephone Encounter (Signed)
LVM ADVISING APPT 5/1 CX'D DUE TO MD LOA. ADVISED IN VM NEW APPT 4/28 @ 2P WITH DR. Earnest Conroy.

## 2014-03-24 ENCOUNTER — Other Ambulatory Visit: Payer: Self-pay | Admitting: *Deleted

## 2014-03-24 ENCOUNTER — Ambulatory Visit: Payer: Medicare Other

## 2014-03-24 ENCOUNTER — Telehealth: Payer: Self-pay | Admitting: *Deleted

## 2014-03-24 ENCOUNTER — Ambulatory Visit (HOSPITAL_BASED_OUTPATIENT_CLINIC_OR_DEPARTMENT_OTHER): Payer: Medicare Other | Admitting: Adult Health

## 2014-03-24 VITALS — BP 150/82 | HR 77 | Temp 98.5°F | Resp 18 | Ht 61.0 in | Wt 152.3 lb

## 2014-03-24 DIAGNOSIS — D689 Coagulation defect, unspecified: Secondary | ICD-10-CM

## 2014-03-24 DIAGNOSIS — C50119 Malignant neoplasm of central portion of unspecified female breast: Secondary | ICD-10-CM

## 2014-03-24 DIAGNOSIS — C50919 Malignant neoplasm of unspecified site of unspecified female breast: Secondary | ICD-10-CM | POA: Diagnosis not present

## 2014-03-24 DIAGNOSIS — Z171 Estrogen receptor negative status [ER-]: Secondary | ICD-10-CM | POA: Diagnosis not present

## 2014-03-24 DIAGNOSIS — E876 Hypokalemia: Secondary | ICD-10-CM

## 2014-03-24 DIAGNOSIS — I890 Lymphedema, not elsewhere classified: Secondary | ICD-10-CM | POA: Diagnosis not present

## 2014-03-24 DIAGNOSIS — R918 Other nonspecific abnormal finding of lung field: Secondary | ICD-10-CM

## 2014-03-24 DIAGNOSIS — J45909 Unspecified asthma, uncomplicated: Secondary | ICD-10-CM

## 2014-03-24 DIAGNOSIS — I2699 Other pulmonary embolism without acute cor pulmonale: Secondary | ICD-10-CM

## 2014-03-24 DIAGNOSIS — I82403 Acute embolism and thrombosis of unspecified deep veins of lower extremity, bilateral: Secondary | ICD-10-CM

## 2014-03-24 DIAGNOSIS — C773 Secondary and unspecified malignant neoplasm of axilla and upper limb lymph nodes: Secondary | ICD-10-CM | POA: Diagnosis not present

## 2014-03-24 DIAGNOSIS — I82409 Acute embolism and thrombosis of unspecified deep veins of unspecified lower extremity: Secondary | ICD-10-CM

## 2014-03-24 NOTE — Telephone Encounter (Signed)
Received voice message from patient that her right hand has been increasingly swelling over the past two weeks. Patient stated, "the top of my hand is puffed out and has a sore spot. The swelling travels up to the outside of my elbow. My neck hurts too. Patient denied fevers, nausea and vomiting, lightheadedness. Patient stated, "I've had some shortness of breath but my PCP knows about that. I've had allergies and he started me on Singulair. I discussed this with Mendel Ryder, NP, and we decided patient needs to have labs and be seen today before the weekend. Patient was called and is coming in today. Patient verbalized understanding. POF sent to schedulers.

## 2014-03-24 NOTE — Progress Notes (Signed)
Hematology and Oncology Follow Up Visit  Jessica Wells [id number] [birth year] 68 y.o. 03/25/2014 9:16 PM     Principle Diagnosis:Jessica Wells 68 y.o. female with inflammatory breast cancer.     Prior Therapy:  1. The patient was originally seen in clinic for new diagnosis of inflammatory breast cancer, she was referred by Dr. Rolm Bookbinder. She had a mammogram performed on 07/14/2012 that showed an abnormality. That was biopsied and it showed an invasive mammary carcinoma with lymphovascular invasion grade 3 ER negative PR negative HER-2/neu positive.   2. The patient had a MRI of the breasts performed on 07/19/2012. The MRI showed diffuse right breast neoplasm on a large level I right axillary lymph nodes compatible with lymphatic spread. The patient has had a biopsy of the right axillary lymph node that is compatible with invasive mammary carcinoma.   3. The patient began neoadjuvant FEC 100 with day 2 Neulasta support on 07/23/2012.   4. Weekly neoadjuvant Taxol and Herceptin started on 09/17/12. The patient developed neuropathies, and Taxol was discontinued early. She completed 7 weeks of Taxol/Herceptin combination therapy. The patient then received 2 weeks of Herceptin only.   5. Weekly Gemcitabine and Herceptin was started on 11/19/12 and completed on 12/17/2012 after 5 cycles.   6. A regimen of Lasix 20 mg PO and Kdur 10 mEq was started on 01/07/2013 due to chronic lower extremity edema. Medications are only to be taken if patient has a weight gain of 5 lbs or more in a day (daily weights).   7. Anemia was treated with blood transfusions of 1 unit of PRBCs given on 01/11/2013 and another unit of PRBC's given 01/12/2013.   8. Patient underwent right mastectomy on 02/08/13. Pathology showed a complete response to chemotherapy in the breast and 11 nodes identified with fibrosis but no carcinoma.   9. Radiation therapy from 03/31/13 through 05/17/13.   10. S/P Every 3 week herceptin from  02/25/13 through 10/14/13.   Current therapy:  Observation  Interim History: Jessica Wells 68 y.o. female returns today for an urgent visit for right arm swelling that began in her right hand and extended up her right arm and has worsened over the past week.  She has been wearing her sleeve, and she cannot think of any precipitating factor.  She denies redness or tenderness to the area.  She began noticing the swelling in her right hand.  She has no pain to the area, just heaviness.  Otherwise, she is doing well, and a 10 point ROS is negative.    Medications:  Current Outpatient Prescriptions  Medication Sig Dispense Refill  . b complex vitamins tablet Take 1 tablet by mouth daily.      . calcium-vitamin D (OSCAL WITH D) 500-200 MG-UNIT per tablet Take 1 tablet by mouth every other day.       . Ferrous Sulfate (SLOW FE PO) Take 1 tablet by mouth every other day.      . fish oil-omega-3 fatty acids 1000 MG capsule Take 2 g by mouth daily.      . Flaxseed, Linseed, (FLAX SEEDS PO) Take 1 tablet by mouth daily.      Marland Kitchen glucosamine-chondroitin 500-400 MG tablet Take 1 tablet by mouth daily.      . metoprolol succinate (TOPROL-XL) 50 MG 24 hr tablet Take 1 tablet (50 mg total) by mouth daily before breakfast. Take with or immediately following a meal.  30 tablet  5  . montelukast (SINGULAIR) 10 MG tablet Take  10 mg by mouth at bedtime.      . Multiple Vitamin (MULTIVITAMIN WITH MINERALS) TABS Take 1 tablet by mouth every other day.       Marland Kitchen omeprazole (PRILOSEC) 20 MG capsule Take 20 mg by mouth daily. For heartburn      . PROVENTIL HFA 108 (90 BASE) MCG/ACT inhaler Inhale 2 puffs into the lungs every 4 (four) hours as needed.       . warfarin (COUMADIN) 5 MG tablet 7.69m 5 nights per week, then 554m2 nights per week.  40 tablet  2   No current facility-administered medications for this visit.     Allergies:  Allergies  Allergen Reactions  . Anesthetics, Amide Nausea And Vomiting    Projectile  vomitting-Nausea- with 24hrs of dry heaves. With any anesthetics  . Bactrim [Sulfamethoxazole-Trimethoprim] Other (See Comments)    Massive diarrhea, nausea vomiting, platelets dropped, hemoglobin dropped, admitted to hospital  . Sulfa Antibiotics Nausea And Vomiting and Other (See Comments)    Crazy feeling in head Life threatening reaction, decreased blood counts  . Codeine Nausea And Vomiting  . Codeine Phosphate Nausea And Vomiting  . Eggs Or Egg-Derived Products Hives    Medical History: Past Medical History  Diagnosis Date  . GERD (gastroesophageal reflux disease)   . Dysrhythmia     PAT-sees dr scLina Sayreeds  . Allergy   . PAT (paroxysmal atrial tachycardia)     hx  . Arthritis     osteopenia,knees  . PONV (postoperative nausea and vomiting) 09-13-12    severe, with Port-a-cath, was managed without PONV  . Clotting disorder     prothrombin gene mutation-heterozygous   . History of chemotherapy     Last dose to be 02-04-13.  Was rx'd with Herceptin & Gemzar  . DVT (deep venous thrombosis)   . Asthma     Uses Inhalers Proventil as needed  . Bursitis 01-25-13    Rt. shoulder- mildly affected now.  . History of blood transfusion 01-25-13    2 units 01-11-13  . Pneumonia 01-25-13    hx. 12-27-12-hospital stay x 9 days, now resolved.  . S/P radiation therapy 4-12 wks ago 03/31/13-05/17/13    right breast/supraclavicular fossa/posterior axillary boost  . Breast cancer 07/14/12    inflammatory right breast ca, ER/PR -    Surgical History:  Past Surgical History  Procedure Laterality Date  . Dilation and curettage of uterus      x3  . Tonsillectomy    . Colonoscopy    . Portacath placement  07/21/2012    Procedure: INSERTION PORT-A-CATH;  Surgeon: MaRolm BookbinderMD;  [facility] Service: General;  Laterality: Left;/ Replacement done 10'13  . Insertion of vena cava filter  12/2012  . Giant cell tumor  01-25-13    removed Rt. forearm.  . Eye lid  surgery      right( MD office)  . Mastectomy modified radical Right 02/08/2013    Procedure: MASTECTOMY MODIFIED RADICAL;  Surgeon: MaRolm BookbinderMD;  Location: [facility];  Service: General;  Laterality: Right;  . Breast surgery       Review of Systems: A 10 point review of systems was conducted and is otherwise negative except for what is noted above.     Physical Exam: Blood pressure 150/82, pulse 77, temperature 98.5 F (36.9 C), temperature source Oral, resp. rate 18, height 5' 1"  (1.549 m), weight 152 lb 4.8 oz (69.083 kg). GENERAL: Patient is a well  appearing female in no acute distress HEENT:  Sclerae anicteric.  Oropharynx clear and moist. No ulcerations or evidence of oropharyngeal candidiasis. Neck is supple.  NODES:  No cervical, supraclavicular, or axillary lymphadenopathy palpated.  BREAST EXAM:  Left breast without masses, or nodules, no sign of recurrence, right mastectomy without nodularity, benign breast exam.  LUNGS:  Clear to auscultation bilaterally.  No wheezes or rhonchi. HEART:  Regular rate and rhythm. No murmur appreciated. ABDOMEN:  Soft, nontender.  Positive, normoactive bowel sounds. No organomegaly palpated. MSK:  No focal spinal tenderness to palpation. Full range of motion bilaterally in the upper extremities. EXTREMITIES:  No peripheral edema.  Right arm edema SKIN:  Clear with no obvious rashes or skin changes. No nail dyscrasia. NEURO:  Nonfocal. Well oriented.  Appropriate affect. ECOG PERFORMANCE STATUS: 1 - Symptomatic but completely ambulatory   Lab Results: Lab Results  Component Value Date   WBC 6.8 03/03/2014   HGB 11.7 03/03/2014   HCT 34.5* 03/03/2014   MCV 88.3 03/03/2014   PLT 176 03/03/2014     Chemistry      Component Value Date/Time   NA 139 03/03/2014 1507   NA 140 02/10/2013 0400   K 4.0 03/03/2014 1507   K 3.4* 02/10/2013 0400   CL 105 05/20/2013 1459   CL 106 02/10/2013 0400   CO2 25 03/03/2014 1507   CO2 26 02/10/2013 0400   BUN 22.9  03/03/2014 1507   BUN 11 02/10/2013 0400   CREATININE 1.0 03/03/2014 1507   CREATININE 0.84 02/10/2013 0400      Component Value Date/Time   CALCIUM 9.0 03/03/2014 1507   CALCIUM 8.7 02/10/2013 0400   ALKPHOS 66 03/03/2014 1507   ALKPHOS 66 01/28/2013 1630   AST 17 03/03/2014 1507   AST 36 01/28/2013 1630   ALT 18 03/03/2014 1507   ALT 55* 01/28/2013 1630   BILITOT 0.31 03/03/2014 1507   BILITOT 0.2* 01/28/2013 1630      Assessment and Plan: Jessica Wells 68 y.o. female with  1. Inflammatory HER-2/neu positive breast cancer.  Please see prior history above.  The patient underwent neoadjuvant chemotherapy/Herceptin followed by a right mastectomy and axillary node dissection that demonstrated a complete pathologic response to chemotherapy.  This was followed by adjuvant radiation therapy and she completed one calendar year of Herceptin.  We discussed her breast cancer and her next scans.  She is hoping to have more scans and f/u with Dr. Donne Hazel regarding a left mastectomy due to the discomfort of having only one breast.  I have ordered scans today.    2. Lymphedema:  I referred the patient to the lymphedema clinic for evaluation and treatment of her lymphedema.    The patient will undergo CT scans and I will call her with her results to discuss sending her back to Dr. Donne Hazel for discussion of surgery.  She is due for routine f/u in 6 months.   She knows to call us in the interim for any questions or concerns.  We can certainly see her sooner if needed.  I spent 25 minutes counseling the patient face to face.  The total time spent in the appointment was 30 minutes.  Minette Headland, [place] [phone] 03/25/2014 9:16 PM

## 2014-03-25 ENCOUNTER — Encounter: Payer: Self-pay | Admitting: Adult Health

## 2014-03-28 ENCOUNTER — Other Ambulatory Visit: Payer: Medicare Other

## 2014-03-28 ENCOUNTER — Ambulatory Visit: Payer: Medicare Other

## 2014-03-29 ENCOUNTER — Telehealth: Payer: Self-pay | Admitting: *Deleted

## 2014-03-29 NOTE — Telephone Encounter (Signed)
Called pt to inform her that when she comes for port flush appt on 04/04/14  Charlestine Massed, NP will go over scan results with her. Left message on VM. Told pt if she has any questions to call me back @ [phone]. Message to be forwarded to Charlestine Massed, NP.

## 2014-03-31 ENCOUNTER — Other Ambulatory Visit: Payer: Medicare Other

## 2014-03-31 ENCOUNTER — Ambulatory Visit: Payer: Medicare Other | Admitting: Oncology

## 2014-03-31 ENCOUNTER — Ambulatory Visit: Payer: Medicare Other | Attending: Oncology | Admitting: Physical Therapy

## 2014-03-31 ENCOUNTER — Ambulatory Visit: Payer: Medicare Other | Admitting: Adult Health

## 2014-03-31 ENCOUNTER — Telehealth: Payer: Self-pay | Admitting: Oncology

## 2014-03-31 DIAGNOSIS — C50919 Malignant neoplasm of unspecified site of unspecified female breast: Secondary | ICD-10-CM | POA: Diagnosis not present

## 2014-03-31 DIAGNOSIS — I890 Lymphedema, not elsewhere classified: Secondary | ICD-10-CM | POA: Insufficient documentation

## 2014-03-31 DIAGNOSIS — M24519 Contracture, unspecified shoulder: Secondary | ICD-10-CM | POA: Insufficient documentation

## 2014-03-31 DIAGNOSIS — IMO0001 Reserved for inherently not codable concepts without codable children: Secondary | ICD-10-CM | POA: Insufficient documentation

## 2014-03-31 NOTE — Telephone Encounter (Signed)
Gave pt oral contrast for CT

## 2014-04-03 ENCOUNTER — Ambulatory Visit (HOSPITAL_COMMUNITY)
Admission: RE | Admit: 2014-04-03 | Discharge: 2014-04-03 | Disposition: A | Discharge status: Home / Self Care | Payer: Medicare Other | Source: Ambulatory Visit | Attending: Adult Health | Admitting: Adult Health

## 2014-04-03 ENCOUNTER — Encounter (HOSPITAL_COMMUNITY): Payer: Self-pay

## 2014-04-03 DIAGNOSIS — Z923 Personal history of irradiation: Secondary | ICD-10-CM | POA: Diagnosis not present

## 2014-04-03 DIAGNOSIS — Z9221 Personal history of antineoplastic chemotherapy: Secondary | ICD-10-CM | POA: Insufficient documentation

## 2014-04-03 DIAGNOSIS — C50119 Malignant neoplasm of central portion of unspecified female breast: Secondary | ICD-10-CM | POA: Diagnosis not present

## 2014-04-03 DIAGNOSIS — R935 Abnormal findings on diagnostic imaging of other abdominal regions, including retroperitoneum: Secondary | ICD-10-CM | POA: Diagnosis not present

## 2014-04-03 DIAGNOSIS — J984 Other disorders of lung: Secondary | ICD-10-CM | POA: Diagnosis not present

## 2014-04-03 MED ORDER — IOHEXOL 300 MG/ML  SOLN
100.0000 mL | Freq: Once | INTRAMUSCULAR | Status: AC | PRN
  Administered 2014-04-03: 100 mL via INTRAVENOUS

## 2014-04-04 ENCOUNTER — Ambulatory Visit: Payer: Medicare Other | Admitting: Adult Health

## 2014-04-04 ENCOUNTER — Other Ambulatory Visit (HOSPITAL_BASED_OUTPATIENT_CLINIC_OR_DEPARTMENT_OTHER): Payer: Medicare Other

## 2014-04-04 ENCOUNTER — Ambulatory Visit: Payer: Medicare Other

## 2014-04-04 VITALS — BP 129/63 | HR 80 | Temp 98.0°F

## 2014-04-04 DIAGNOSIS — Z95828 Presence of other vascular implants and grafts: Secondary | ICD-10-CM

## 2014-04-04 DIAGNOSIS — D689 Coagulation defect, unspecified: Secondary | ICD-10-CM

## 2014-04-04 DIAGNOSIS — C50919 Malignant neoplasm of unspecified site of unspecified female breast: Secondary | ICD-10-CM

## 2014-04-04 DIAGNOSIS — J45909 Unspecified asthma, uncomplicated: Secondary | ICD-10-CM

## 2014-04-04 DIAGNOSIS — I82409 Acute embolism and thrombosis of unspecified deep veins of unspecified lower extremity: Secondary | ICD-10-CM

## 2014-04-04 DIAGNOSIS — I2699 Other pulmonary embolism without acute cor pulmonale: Secondary | ICD-10-CM

## 2014-04-04 DIAGNOSIS — E876 Hypokalemia: Secondary | ICD-10-CM

## 2014-04-04 DIAGNOSIS — R918 Other nonspecific abnormal finding of lung field: Secondary | ICD-10-CM

## 2014-04-04 DIAGNOSIS — C50119 Malignant neoplasm of central portion of unspecified female breast: Secondary | ICD-10-CM

## 2014-04-04 DIAGNOSIS — I82403 Acute embolism and thrombosis of unspecified deep veins of lower extremity, bilateral: Secondary | ICD-10-CM

## 2014-04-04 DIAGNOSIS — C773 Secondary and unspecified malignant neoplasm of axilla and upper limb lymph nodes: Secondary | ICD-10-CM

## 2014-04-04 DIAGNOSIS — Z7901 Long term (current) use of anticoagulants: Secondary | ICD-10-CM | POA: Diagnosis not present

## 2014-04-04 LAB — COMPREHENSIVE METABOLIC PANEL (CC13)
ALK PHOS: 66 U/L (ref 40–150)
ALT: 18 U/L (ref 0–55)
AST: 18 U/L (ref 5–34)
Albumin: 3.7 g/dL (ref 3.5–5.0)
Anion Gap: 11 mEq/L (ref 3–11)
BILIRUBIN TOTAL: 0.32 mg/dL (ref 0.20–1.20)
BUN: 26.4 mg/dL — AB (ref 7.0–26.0)
CO2: 24 mEq/L (ref 22–29)
Calcium: 9.4 mg/dL (ref 8.4–10.4)
Chloride: 107 mEq/L (ref 98–109)
Creatinine: 1.1 mg/dL (ref 0.6–1.1)
GLUCOSE: 129 mg/dL (ref 70–140)
Potassium: 3.8 mEq/L (ref 3.5–5.1)
Sodium: 142 mEq/L (ref 136–145)
Total Protein: 6.6 g/dL (ref 6.4–8.3)

## 2014-04-04 LAB — PROTHROMBIN TIME
INR: 2.34 — ABNORMAL HIGH (ref <1.50)
PROTHROMBIN TIME: 24.9 s — AB (ref 11.6–15.2)

## 2014-04-04 LAB — CBC WITH DIFFERENTIAL/PLATELET
BASO%: 0.7 % (ref 0.0–2.0)
BASOS ABS: 0 10*3/uL (ref 0.0–0.1)
EOS ABS: 0.2 10*3/uL (ref 0.0–0.5)
EOS%: 3.4 % (ref 0.0–7.0)
HCT: 35.1 % (ref 34.8–46.6)
HEMOGLOBIN: 11.8 g/dL (ref 11.6–15.9)
LYMPH%: 21 % (ref 14.0–49.7)
MCH: 29.7 pg (ref 25.1–34.0)
MCHC: 33.7 g/dL (ref 31.5–36.0)
MCV: 88.1 fL (ref 79.5–101.0)
MONO#: 0.4 10*3/uL (ref 0.1–0.9)
MONO%: 6.6 % (ref 0.0–14.0)
NEUT#: 4.4 10*3/uL (ref 1.5–6.5)
NEUT%: 68.3 % (ref 38.4–76.8)
Platelets: 177 10*3/uL (ref 145–400)
RBC: 3.99 10*6/uL (ref 3.70–5.45)
RDW: 13.8 % (ref 11.2–14.5)
WBC: 6.5 10*3/uL (ref 3.9–10.3)
lymph#: 1.4 10*3/uL (ref 0.9–3.3)

## 2014-04-04 MED ORDER — HEPARIN SOD (PORK) LOCK FLUSH 100 UNIT/ML IV SOLN
500.0000 [IU] | Freq: Once | INTRAVENOUS | Status: AC
  Administered 2014-04-04: 500 [IU] via INTRAVENOUS
  Filled 2014-04-04: qty 5

## 2014-04-04 MED ORDER — SODIUM CHLORIDE 0.9 % IJ SOLN
10.0000 mL | INTRAMUSCULAR | Status: DC | PRN
  Administered 2014-04-04: 10 mL via INTRAVENOUS
  Filled 2014-04-04: qty 10

## 2014-04-04 NOTE — Patient Instructions (Signed)

## 2014-04-05 ENCOUNTER — Telehealth: Payer: Self-pay | Admitting: Oncology

## 2014-04-05 ENCOUNTER — Ambulatory Visit: Payer: Medicare Other | Admitting: Physical Therapy

## 2014-04-05 NOTE — Telephone Encounter (Signed)
lmonvm advising the pt of her 6 mnth f/u appt in oct.

## 2014-04-06 ENCOUNTER — Other Ambulatory Visit: Payer: Self-pay | Admitting: Pharmacist

## 2014-04-06 DIAGNOSIS — I82403 Acute embolism and thrombosis of unspecified deep veins of lower extremity, bilateral: Secondary | ICD-10-CM

## 2014-04-07 ENCOUNTER — Other Ambulatory Visit: Payer: Self-pay

## 2014-04-07 ENCOUNTER — Ambulatory Visit: Payer: Medicare Other | Admitting: Physical Therapy

## 2014-04-07 DIAGNOSIS — IMO0001 Reserved for inherently not codable concepts without codable children: Secondary | ICD-10-CM | POA: Diagnosis not present

## 2014-04-07 DIAGNOSIS — C50119 Malignant neoplasm of central portion of unspecified female breast: Secondary | ICD-10-CM

## 2014-04-07 NOTE — Progress Notes (Signed)
Labs ordered for appt on 6/8.

## 2014-04-10 ENCOUNTER — Ambulatory Visit: Payer: Medicare Other | Admitting: Physical Therapy

## 2014-04-10 DIAGNOSIS — IMO0001 Reserved for inherently not codable concepts without codable children: Secondary | ICD-10-CM | POA: Diagnosis not present

## 2014-04-13 ENCOUNTER — Ambulatory Visit: Payer: Medicare Other | Admitting: Physical Therapy

## 2014-04-13 DIAGNOSIS — IMO0001 Reserved for inherently not codable concepts without codable children: Secondary | ICD-10-CM | POA: Diagnosis not present

## 2014-04-17 ENCOUNTER — Telehealth: Payer: Self-pay

## 2014-04-17 ENCOUNTER — Ambulatory Visit: Payer: Medicare Other | Admitting: Physical Therapy

## 2014-04-17 DIAGNOSIS — IMO0001 Reserved for inherently not codable concepts without codable children: Secondary | ICD-10-CM | POA: Diagnosis not present

## 2014-04-17 NOTE — Telephone Encounter (Signed)
Fax rcvd from [facility] requesting signed order for compression sleeve.

## 2014-04-18 NOTE — Telephone Encounter (Signed)
Faded signed order to [facility].  Sent to scan

## 2014-04-19 ENCOUNTER — Ambulatory Visit: Payer: Medicare Other

## 2014-04-20 ENCOUNTER — Ambulatory Visit: Payer: Medicare Other | Admitting: Physical Therapy

## 2014-04-20 DIAGNOSIS — IMO0001 Reserved for inherently not codable concepts without codable children: Secondary | ICD-10-CM | POA: Diagnosis not present

## 2014-04-25 ENCOUNTER — Encounter (INDEPENDENT_AMBULATORY_CARE_PROVIDER_SITE_OTHER): Payer: Self-pay

## 2014-04-25 ENCOUNTER — Ambulatory Visit (INDEPENDENT_AMBULATORY_CARE_PROVIDER_SITE_OTHER): Payer: Medicare Other | Admitting: General Surgery

## 2014-04-25 ENCOUNTER — Encounter (INDEPENDENT_AMBULATORY_CARE_PROVIDER_SITE_OTHER): Payer: Self-pay | Admitting: General Surgery

## 2014-04-25 ENCOUNTER — Ambulatory Visit: Payer: Medicare Other

## 2014-04-25 VITALS — BP 126/70 | HR 69 | Temp 97.2°F | Resp 16 | Ht 61.0 in | Wt 151.0 lb

## 2014-04-25 DIAGNOSIS — IMO0001 Reserved for inherently not codable concepts without codable children: Secondary | ICD-10-CM | POA: Diagnosis not present

## 2014-04-25 DIAGNOSIS — C50119 Malignant neoplasm of central portion of unspecified female breast: Secondary | ICD-10-CM

## 2014-04-25 NOTE — Progress Notes (Signed)
Patient ID: Jessica Wells, female   DOB: 03/21/1946, 68 y.o.   MRN: [id number]  Chief Complaint  Patient presents with  . left breast follow-up    HPI Jessica Wells is a 68 y.o. female.   HPI This is a 68 year-old female I know well from a right modified radical mastectomy for inflammatory breast cancer. This is HER-2/neu amplified. She underwent primary chemotherapy followed by surgery. She then underwent radiation therapy following that. Herceptin was completed in November of 2014. She had a mammogram on the left side in November that was negative. She had CT chest abdomen pelvis that were negative recently as well. She has no real complaints today. She is being treated for rue lymphedema which is not surprising. She has a lot of trouble with her left breast due to size and pain with bra. She is also concerned about developing another cancer on the left side.  She comes in today to discuss a possible mastectomy on that left side.  She does not want to consider a reduction anymore.  Past Medical History  Diagnosis Date  . GERD (gastroesophageal reflux disease)   . Dysrhythmia     PAT-sees dr Lina Sayre meds  . Allergy   . PAT (paroxysmal atrial tachycardia)     hx  . Arthritis     osteopenia,knees  . PONV (postoperative nausea and vomiting) 09-13-12    severe, with Port-a-cath, was managed without PONV  . Clotting disorder     prothrombin gene mutation-heterozygous   . History of chemotherapy     Last dose to be 02-04-13.  Was rx'd with Herceptin & Gemzar  . DVT (deep venous thrombosis)   . Asthma     Uses Inhalers Proventil as needed  . Bursitis 01-25-13    Rt. shoulder- mildly affected now.  . History of blood transfusion 01-25-13    2 units 01-11-13  . Pneumonia 01-25-13    hx. 12-27-12-hospital stay x 9 days, now resolved.  . S/P radiation therapy 4-12 wks ago 03/31/13-05/17/13    right breast/supraclavicular fossa/posterior axillary boost  . Breast cancer 07/14/12    inflammatory right  breast ca, ER/PR -    Past Surgical History  Procedure Laterality Date  . Dilation and curettage of uterus      x3  . Tonsillectomy    . Colonoscopy    . Portacath placement  07/21/2012    Procedure: INSERTION PORT-A-CATH;  Surgeon: Rolm Bookbinder, MD;  Location: [facility];  Service: General;  Laterality: Left;/ Replacement done 10'13  . Insertion of vena cava filter  12/2012  . Giant cell tumor  01-25-13    removed Rt. forearm.  . Eye lid surgery      right( MD office)  . Mastectomy modified radical Right 02/08/2013    Procedure: MASTECTOMY MODIFIED RADICAL;  Surgeon: Rolm Bookbinder, MD;  Location: [facility];  Service: General;  Laterality: Right;  . Breast surgery      Family History  Problem Relation Age of Onset  . Diabetes Mother   . Heart disease Mother   . Hypertension Mother   . Hyperlipidemia Mother   . Breast cancer Mother 37    lobular breast cancer  . Heart disease Maternal Aunt   . Heart disease Maternal Uncle   . Heart disease Maternal Grandmother     died in her 16s from heart disease  . Lung cancer Father 73    adenocarcinoma  . Cancer Paternal Uncle     dx  in mid 58s with a cancer in the leg, died late 50s; grandpaternal half uncle    Social History History  Substance Use Topics  . Smoking status: Former Smoker -- 1.00 packs/day for 35 years    Types: Cigarettes    Quit date: 07/21/1979  . Smokeless tobacco: Never Used  . Alcohol Use: No    Allergies  Allergen Reactions  . Anesthetics, Amide Nausea And Vomiting    Projectile vomitting-Nausea- with 24hrs of dry heaves. With any anesthetics  . Bactrim [Sulfamethoxazole-Trimethoprim] Other (See Comments)    Massive diarrhea, nausea vomiting, platelets dropped, hemoglobin dropped, admitted to hospital  . Sulfa Antibiotics Nausea And Vomiting and Other (See Comments)    Crazy feeling in head Life threatening reaction, decreased blood counts  . Codeine Nausea And Vomiting  . Codeine  Phosphate Nausea And Vomiting  . Eggs Or Egg-Derived Products Hives    Current Outpatient Prescriptions  Medication Sig Dispense Refill  . b complex vitamins tablet Take 1 tablet by mouth daily.      . calcium-vitamin D (OSCAL WITH D) 500-200 MG-UNIT per tablet Take 1 tablet by mouth every other day.       . Ferrous Sulfate (SLOW FE PO) Take 1 tablet by mouth every other day.      . fish oil-omega-3 fatty acids 1000 MG capsule Take 2 g by mouth daily.      . Flaxseed, Linseed, (FLAX SEEDS PO) Take 1 tablet by mouth daily.      Marland Kitchen glucosamine-chondroitin 500-400 MG tablet Take 1 tablet by mouth daily.      . metoprolol succinate (TOPROL-XL) 50 MG 24 hr tablet Take 1 tablet (50 mg total) by mouth daily before breakfast. Take with or immediately following a meal.  30 tablet  5  . montelukast (SINGULAIR) 10 MG tablet Take 10 mg by mouth at bedtime.      . Multiple Vitamin (MULTIVITAMIN WITH MINERALS) TABS Take 1 tablet by mouth every other day.       Marland Kitchen omeprazole (PRILOSEC) 20 MG capsule Take 20 mg by mouth daily. For heartburn      . PROVENTIL HFA 108 (90 BASE) MCG/ACT inhaler Inhale 2 puffs into the lungs every 4 (four) hours as needed.       . warfarin (COUMADIN) 5 MG tablet 7.11m 5 nights per week, then 548m2 nights per week.  40 tablet  2   No current facility-administered medications for this visit.    Review of Systems Review of Systems  Constitutional: Negative for fever, chills and unexpected weight change.  HENT: Negative for congestion, hearing loss, sore throat, trouble swallowing and voice change.   Eyes: Negative for visual disturbance.  Respiratory: Negative for cough and wheezing.   Cardiovascular: Negative for chest pain, palpitations and leg swelling.  Gastrointestinal: Negative for nausea, vomiting, abdominal pain, diarrhea, constipation, blood in stool, abdominal distention and anal bleeding.  Genitourinary: Negative for hematuria, vaginal bleeding and difficulty  urinating.  Musculoskeletal: Negative for arthralgias.  Skin: Negative for rash and wound.  Neurological: Negative for seizures, syncope and headaches.  Hematological: Negative for adenopathy. Does not bruise/bleed easily.  Psychiatric/Behavioral: Negative for confusion.    Blood pressure 126/70, pulse 69, temperature 97.2 F (36.2 C), resp. rate 16, height 5' 1"  (1.549 m), weight 151 lb (68.493 kg).  Physical Exam Physical Exam  Vitals reviewed. Constitutional: She appears well-developed and well-nourished.  Eyes: No scleral icterus.  Neck: Neck supple.  Cardiovascular: Normal rate and normal heart  sounds.   Pulmonary/Chest: Effort normal and breath sounds normal. She has no wheezes. She has no rales. Right breast exhibits no mass and no tenderness. Left breast exhibits no inverted nipple, no mass, no nipple discharge, no skin change and no tenderness.    Lymphadenopathy:    She has no cervical adenopathy.    She has no axillary adenopathy.       Right: No supraclavicular adenopathy present.       Left: No supraclavicular adenopathy present.    Data Reviewed Prior mm and oncology notes, recent ct c/a/p  Assessment    History right breast inflammatory cancer      Plan    She understands that a prophylactic left mastectomy is not 100% preventive and she can have a cancer develop there.  She also understands that this will not affect her survival. She does however really want to pursue this as it is an issue with clothes, back etc and she is also concerned about remaining breast tissue.  I think this is not unreasonable given her response pathologically to chemo as well as recent ct scans.  We will need cardiology clearance, mgt of coumadin by heme given dvt history she will need to be on lovenox bridge.  I will wait on these then discuss scheduling. We discussed the risks of operation including bleeding, infection, possible reoperation among others.          Rolm Bookbinder 04/25/2014, 11:00 AM

## 2014-04-26 ENCOUNTER — Ambulatory Visit: Payer: Medicare Other | Admitting: Physical Therapy

## 2014-04-26 DIAGNOSIS — IMO0001 Reserved for inherently not codable concepts without codable children: Secondary | ICD-10-CM | POA: Diagnosis not present

## 2014-04-28 ENCOUNTER — Ambulatory Visit: Payer: Medicare Other | Admitting: Physical Therapy

## 2014-04-28 ENCOUNTER — Encounter: Payer: Self-pay | Admitting: Cardiology

## 2014-04-28 DIAGNOSIS — IMO0001 Reserved for inherently not codable concepts without codable children: Secondary | ICD-10-CM | POA: Diagnosis not present

## 2014-05-01 ENCOUNTER — Other Ambulatory Visit (INDEPENDENT_AMBULATORY_CARE_PROVIDER_SITE_OTHER): Payer: Self-pay | Admitting: General Surgery

## 2014-05-01 ENCOUNTER — Ambulatory Visit: Payer: Medicare Other | Attending: Oncology | Admitting: Physical Therapy

## 2014-05-01 DIAGNOSIS — C50919 Malignant neoplasm of unspecified site of unspecified female breast: Secondary | ICD-10-CM | POA: Diagnosis not present

## 2014-05-01 DIAGNOSIS — IMO0001 Reserved for inherently not codable concepts without codable children: Secondary | ICD-10-CM | POA: Diagnosis not present

## 2014-05-01 DIAGNOSIS — M24519 Contracture, unspecified shoulder: Secondary | ICD-10-CM | POA: Diagnosis not present

## 2014-05-01 DIAGNOSIS — I890 Lymphedema, not elsewhere classified: Secondary | ICD-10-CM | POA: Diagnosis not present

## 2014-05-03 ENCOUNTER — Ambulatory Visit: Payer: Medicare Other

## 2014-05-03 DIAGNOSIS — IMO0001 Reserved for inherently not codable concepts without codable children: Secondary | ICD-10-CM | POA: Diagnosis not present

## 2014-05-03 MED ORDER — LIDOCAINE-PRILOCAINE 2.5-2.5 % EX CREA
TOPICAL_CREAM | CUTANEOUS | Status: AC
  Filled 2014-05-03: qty 5

## 2014-05-04 ENCOUNTER — Other Ambulatory Visit: Payer: Self-pay | Admitting: Adult Health

## 2014-05-04 DIAGNOSIS — I82409 Acute embolism and thrombosis of unspecified deep veins of unspecified lower extremity: Secondary | ICD-10-CM

## 2014-05-05 ENCOUNTER — Encounter: Payer: Self-pay | Admitting: Cardiology

## 2014-05-05 ENCOUNTER — Telehealth: Payer: Self-pay | Admitting: Cardiology

## 2014-05-05 ENCOUNTER — Telehealth: Payer: Self-pay

## 2014-05-05 ENCOUNTER — Telehealth (INDEPENDENT_AMBULATORY_CARE_PROVIDER_SITE_OTHER): Payer: Self-pay

## 2014-05-05 ENCOUNTER — Telehealth: Payer: Self-pay | Admitting: Adult Health

## 2014-05-05 ENCOUNTER — Other Ambulatory Visit: Payer: Medicare Other

## 2014-05-05 ENCOUNTER — Ambulatory Visit: Payer: Medicare Other

## 2014-05-05 ENCOUNTER — Ambulatory Visit: Payer: Medicare Other | Admitting: Physical Therapy

## 2014-05-05 DIAGNOSIS — IMO0001 Reserved for inherently not codable concepts without codable children: Secondary | ICD-10-CM | POA: Diagnosis not present

## 2014-05-05 NOTE — Telephone Encounter (Signed)
I sent a letter in [facility] on 5/29 to Dr. Donne Hazel. Please print this out and fax. Thanks.

## 2014-05-05 NOTE — Telephone Encounter (Signed)
Message copied by Illene Regulus on Fri May 05, 2014  9:31 AM ------      Message from: Minette Headland      Created: Tue Apr 25, 2014  3:53 PM       I'd be happy to.  Whenever you get her surgical date, I will bring her in and see her and plan out her bridge. (Just FYI, I will be out of the office June 12-20).            Thanks,       LC      ----- Message -----         From: Illene Regulus, MA         Sent: 04/25/2014   3:09 PM           To: Minette Headland, NP            Hello,       I wanted to see if you would help with this pt that was seeing Dr Humphrey Rolls for breast cancer and managing the Coumadin for DVT's. Dr Donne Hazel saw the pt today to discuss having a left mastectomy for prophylactic reasons due to having right breast cancer and the pt not wanting a breast reduction any longer. I need to see if you would help give clearance on the pt holding the Coumadin and help with getting pt a Lovenox Bridge. Please advise. We do not have the pt scheduled yet for surgery b/c I am sending her to Dr Candee Furbish to get cardiac clearance. Once I get the cardiac clearance I can get a surgery date and let you know those details. Please advise.      Thanks,      Lars Mage       ------

## 2014-05-05 NOTE — Telephone Encounter (Signed)
[name] called back to say they couldn't see the clearance request that I had faxed on 04/25/14 so they asked for me to refax the request. I explained the pt is scheduled for surgery on 05/10/14 by Dr Donne Hazel. [name] said she would send a message thru epic to Dr Marlou Porch since he was not in the office today. I refaxed the clearance request to [phone].

## 2014-05-05 NOTE — Telephone Encounter (Signed)
Per LC - let pt know to hold coumadin d/t port placement.  Pt to come in Monday for labs at which time Ria Comment will see her and give her information on lovenox and possible bridging.  Pt requests 10 am lab appt.  Pt voiced understanding.   Per Theadora Rama - coumadin clinic appt ok to cancel for today.  Theadora Rama will add pt to physician follow up anti-coag list.   POF entered and note given to scheduling to cancel all appt 6/5 and schedule lab appt on 6/8.

## 2014-05-05 NOTE — Telephone Encounter (Signed)
Returned call to [place] at Dr.Wakefield's office.Dr.Skains already sent a letter in epic clearing patient for surgery.He advised Lovenox bridging.Advised consult Dr.Khan with oncology.

## 2014-05-05 NOTE — Telephone Encounter (Signed)
Called pt to make sure she has an appt at the [facility] to get instructions on getting the Lovenox bridge for her surgery scheduled on 05/10/14. The pt has appt today at the [facility] to get labs, port flushed, and Coumadin clinic. I just wanted to make sure we were on the same page b/c I know the surgery date is really close. The pt understands to make sure she get's her info today. I sent Charlestine Massed a staff message yesterday notifying her of the surgical date. The pt understands.

## 2014-05-05 NOTE — Telephone Encounter (Signed)
, °

## 2014-05-05 NOTE — Telephone Encounter (Signed)
LM with Caryl Pina checking on the status of cardiac clearance for the pt. The pt is scheduled for sx on 05/10/14 with Dr Donne Hazel.

## 2014-05-05 NOTE — Telephone Encounter (Signed)
Called to notify me that there is a cardiac clearance letter in pt's chart under the letters tab by Dr Marlou Porch clearing her for surgery.

## 2014-05-05 NOTE — Telephone Encounter (Signed)
Follow Up    Following up of cardiac clearance faxed over on 04/25/14 for pt. Please call.

## 2014-05-05 NOTE — Telephone Encounter (Signed)
Returned call to Jessica Wells from Dr.Wakefield's office.She stated patient is scheduled for surgery 05/10/14 and needs cardiac clearance.Message sent to [place].

## 2014-05-06 MED ORDER — HYDROMORPHONE HCL PF 4 MG/ML IJ SOLN
INTRAMUSCULAR | Status: AC
  Filled 2014-05-06: qty 1

## 2014-05-08 ENCOUNTER — Ambulatory Visit (HOSPITAL_BASED_OUTPATIENT_CLINIC_OR_DEPARTMENT_OTHER): Payer: Medicare Other

## 2014-05-08 ENCOUNTER — Encounter: Payer: Self-pay | Admitting: Pharmacist

## 2014-05-08 ENCOUNTER — Other Ambulatory Visit (HOSPITAL_BASED_OUTPATIENT_CLINIC_OR_DEPARTMENT_OTHER): Payer: Medicare Other

## 2014-05-08 ENCOUNTER — Other Ambulatory Visit: Payer: Self-pay | Admitting: Adult Health

## 2014-05-08 VITALS — BP 138/71 | HR 71 | Temp 98.0°F | Resp 16

## 2014-05-08 DIAGNOSIS — Z452 Encounter for adjustment and management of vascular access device: Secondary | ICD-10-CM

## 2014-05-08 DIAGNOSIS — C50919 Malignant neoplasm of unspecified site of unspecified female breast: Secondary | ICD-10-CM

## 2014-05-08 DIAGNOSIS — C773 Secondary and unspecified malignant neoplasm of axilla and upper limb lymph nodes: Secondary | ICD-10-CM

## 2014-05-08 DIAGNOSIS — Z86718 Personal history of other venous thrombosis and embolism: Secondary | ICD-10-CM | POA: Diagnosis not present

## 2014-05-08 DIAGNOSIS — C50119 Malignant neoplasm of central portion of unspecified female breast: Secondary | ICD-10-CM

## 2014-05-08 DIAGNOSIS — Z7901 Long term (current) use of anticoagulants: Secondary | ICD-10-CM | POA: Diagnosis not present

## 2014-05-08 DIAGNOSIS — I82403 Acute embolism and thrombosis of unspecified deep veins of lower extremity, bilateral: Secondary | ICD-10-CM

## 2014-05-08 DIAGNOSIS — Z95828 Presence of other vascular implants and grafts: Secondary | ICD-10-CM

## 2014-05-08 LAB — COMPREHENSIVE METABOLIC PANEL (CC13)
ALT: 21 U/L (ref 0–55)
AST: 17 U/L (ref 5–34)
Albumin: 3.7 g/dL (ref 3.5–5.0)
Alkaline Phosphatase: 66 U/L (ref 40–150)
Anion Gap: 7 mEq/L (ref 3–11)
BUN: 18.9 mg/dL (ref 7.0–26.0)
CO2: 28 mEq/L (ref 22–29)
CREATININE: 0.9 mg/dL (ref 0.6–1.1)
Calcium: 9.4 mg/dL (ref 8.4–10.4)
Chloride: 106 mEq/L (ref 98–109)
Glucose: 93 mg/dl (ref 70–140)
Potassium: 4 mEq/L (ref 3.5–5.1)
Sodium: 141 mEq/L (ref 136–145)
TOTAL PROTEIN: 6.8 g/dL (ref 6.4–8.3)
Total Bilirubin: 0.62 mg/dL (ref 0.20–1.20)

## 2014-05-08 LAB — CBC WITH DIFFERENTIAL/PLATELET
BASO%: 1 % (ref 0.0–2.0)
Basophils Absolute: 0.1 10*3/uL (ref 0.0–0.1)
EOS%: 3.9 % (ref 0.0–7.0)
Eosinophils Absolute: 0.2 10*3/uL (ref 0.0–0.5)
HEMATOCRIT: 35.4 % (ref 34.8–46.6)
HGB: 12.2 g/dL (ref 11.6–15.9)
LYMPH%: 22.3 % (ref 14.0–49.7)
MCH: 30.4 pg (ref 25.1–34.0)
MCHC: 34.5 g/dL (ref 31.5–36.0)
MCV: 88.3 fL (ref 79.5–101.0)
MONO#: 0.5 10*3/uL (ref 0.1–0.9)
MONO%: 8.4 % (ref 0.0–14.0)
NEUT#: 3.7 10*3/uL (ref 1.5–6.5)
NEUT%: 64.4 % (ref 38.4–76.8)
Platelets: 196 10*3/uL (ref 145–400)
RBC: 4 10*6/uL (ref 3.70–5.45)
RDW: 13.7 % (ref 11.2–14.5)
WBC: 5.7 10*3/uL (ref 3.9–10.3)
lymph#: 1.3 10*3/uL (ref 0.9–3.3)

## 2014-05-08 LAB — PROTIME-INR
INR: 1.2 — AB (ref 2.00–3.50)
PROTIME: 14.4 s — AB (ref 10.6–13.4)

## 2014-05-08 MED ORDER — ENOXAPARIN SODIUM 100 MG/ML ~~LOC~~ SOLN: 100.0000 mg | SUBCUTANEOUS | Status: DC

## 2014-05-08 MED ORDER — SODIUM CHLORIDE 0.9 % IJ SOLN
10.0000 mL | INTRAMUSCULAR | Status: DC | PRN
  Administered 2014-05-08: 10 mL via INTRAVENOUS
  Filled 2014-05-08: qty 10

## 2014-05-08 MED ORDER — HEPARIN SOD (PORK) LOCK FLUSH 100 UNIT/ML IV SOLN
500.0000 [IU] | Freq: Once | INTRAVENOUS | Status: AC
  Administered 2014-05-08: 500 [IU] via INTRAVENOUS
  Filled 2014-05-08: qty 5

## 2014-05-08 NOTE — Progress Notes (Signed)
Patient with planned mastectomy for 6/10. Coumadin currently on hold. To be bridged with lovenox (Rx written today). Patient saw Charlestine Massed today and the plan is to continue to hold coumadin until patient sees Mendel Ryder again at the end of June (~June 24). At this time patient to be referred to coumadin clinic for management, per Discussion with Mendel Ryder this afternoon.  Thank you, Montel Clock, PharmD

## 2014-05-08 NOTE — Patient Instructions (Signed)

## 2014-05-09 ENCOUNTER — Ambulatory Visit: Payer: Medicare Other | Admitting: Physical Therapy

## 2014-05-09 ENCOUNTER — Encounter: Payer: Medicare Other | Admitting: Physical Therapy

## 2014-05-09 ENCOUNTER — Other Ambulatory Visit (INDEPENDENT_AMBULATORY_CARE_PROVIDER_SITE_OTHER): Payer: Self-pay | Admitting: General Surgery

## 2014-05-09 ENCOUNTER — Ambulatory Visit (HOSPITAL_COMMUNITY)
Admission: RE | Admit: 2014-05-09 | Discharge: 2014-05-09 | Disposition: A | Discharge status: Home / Self Care | Payer: Medicare Other | Source: Ambulatory Visit | Attending: Anesthesiology | Admitting: Anesthesiology

## 2014-05-09 ENCOUNTER — Encounter (HOSPITAL_COMMUNITY)
Admission: RE | Admit: 2014-05-09 | Discharge: 2014-05-09 | Disposition: A | Discharge status: Home / Self Care | Payer: Medicare Other | Source: Ambulatory Visit | Attending: General Surgery | Admitting: General Surgery

## 2014-05-09 ENCOUNTER — Encounter (HOSPITAL_COMMUNITY): Payer: Self-pay

## 2014-05-09 ENCOUNTER — Encounter (HOSPITAL_COMMUNITY): Payer: Self-pay | Admitting: Pharmacy Technician

## 2014-05-09 DIAGNOSIS — I739 Peripheral vascular disease, unspecified: Secondary | ICD-10-CM | POA: Diagnosis not present

## 2014-05-09 DIAGNOSIS — IMO0001 Reserved for inherently not codable concepts without codable children: Secondary | ICD-10-CM | POA: Diagnosis not present

## 2014-05-09 DIAGNOSIS — Z7901 Long term (current) use of anticoagulants: Secondary | ICD-10-CM | POA: Diagnosis not present

## 2014-05-09 DIAGNOSIS — J45909 Unspecified asthma, uncomplicated: Secondary | ICD-10-CM | POA: Diagnosis not present

## 2014-05-09 DIAGNOSIS — Z9221 Personal history of antineoplastic chemotherapy: Secondary | ICD-10-CM | POA: Diagnosis not present

## 2014-05-09 DIAGNOSIS — Z9010 Acquired absence of unspecified breast and nipple: Secondary | ICD-10-CM | POA: Diagnosis not present

## 2014-05-09 DIAGNOSIS — Z87891 Personal history of nicotine dependence: Secondary | ICD-10-CM | POA: Diagnosis not present

## 2014-05-09 DIAGNOSIS — Z4001 Encounter for prophylactic removal of breast: Secondary | ICD-10-CM | POA: Diagnosis not present

## 2014-05-09 DIAGNOSIS — Z452 Encounter for adjustment and management of vascular access device: Secondary | ICD-10-CM | POA: Diagnosis not present

## 2014-05-09 DIAGNOSIS — Z86718 Personal history of other venous thrombosis and embolism: Secondary | ICD-10-CM | POA: Diagnosis not present

## 2014-05-09 DIAGNOSIS — Z01818 Encounter for other preprocedural examination: Secondary | ICD-10-CM | POA: Diagnosis not present

## 2014-05-09 DIAGNOSIS — K219 Gastro-esophageal reflux disease without esophagitis: Secondary | ICD-10-CM | POA: Diagnosis not present

## 2014-05-09 DIAGNOSIS — M1710 Unilateral primary osteoarthritis, unspecified knee: Secondary | ICD-10-CM | POA: Diagnosis not present

## 2014-05-09 DIAGNOSIS — Z853 Personal history of malignant neoplasm of breast: Secondary | ICD-10-CM | POA: Diagnosis not present

## 2014-05-09 DIAGNOSIS — D6859 Other primary thrombophilia: Secondary | ICD-10-CM | POA: Diagnosis not present

## 2014-05-09 DIAGNOSIS — K449 Diaphragmatic hernia without obstruction or gangrene: Secondary | ICD-10-CM | POA: Diagnosis not present

## 2014-05-09 DIAGNOSIS — I890 Lymphedema, not elsewhere classified: Secondary | ICD-10-CM | POA: Diagnosis not present

## 2014-05-09 DIAGNOSIS — Z01812 Encounter for preprocedural laboratory examination: Secondary | ICD-10-CM | POA: Diagnosis not present

## 2014-05-09 DIAGNOSIS — I499 Cardiac arrhythmia, unspecified: Secondary | ICD-10-CM | POA: Diagnosis not present

## 2014-05-09 DIAGNOSIS — Z923 Personal history of irradiation: Secondary | ICD-10-CM | POA: Diagnosis not present

## 2014-05-09 DIAGNOSIS — I252 Old myocardial infarction: Secondary | ICD-10-CM | POA: Diagnosis not present

## 2014-05-09 HISTORY — DX: Personal history of other diseases of the digestive system: Z87.19

## 2014-05-09 MED ORDER — CEFAZOLIN SODIUM-DEXTROSE 2-3 GM-% IV SOLR
2.0000 g | INTRAVENOUS | Status: AC
  Administered 2014-05-10: 20 g via INTRAVENOUS
  Filled 2014-05-09: qty 50

## 2014-05-09 NOTE — Progress Notes (Signed)
Spoke with Dr Cristal Generous office , he will add removal of porta cath in Epic for consent.

## 2014-05-09 NOTE — Pre-Procedure Instructions (Signed)
Karry Barrilleaux  05/09/2014   Your procedure is scheduled on:  Wednesday June 10 th at 0830 AM  Report to [facility] at 0630 AM.  Call this number if you have problems the morning of surgery: [phone]   Remember:   Do not eat food or drink liquids after midnight Tuesday.   Take these medicines the morning of surgery with A SIP OF WATER: Metoprolol, Prilosec and use and bring inhaler . Stop Coumadin and start Lovenox injection per instruction from Montel Clock, PharmD. Stop Herbal medication , Vitamins( Vit E, multi) , Fish oil, and Nsaid's 5 days prior to surgery.   Do not wear jewelry, make-up or nail polish.  Do not wear lotions, powders, or perfumes. You may wear deodorant.  Do not shave 48 hours prior to surgery.  Do not bring valuables to the hospital.  [facility] is not responsible  for any belongings or valuables.               Contacts, dentures or bridgework may not be worn into surgery.  Leave suitcase in the car. After surgery it may be brought to your room.  For patients admitted to the hospital, discharge time is determined by your  treatment team.               Patients discharged the day of surgery will not be allowed to drive home.    Special Instructions: [facility] - Preparing for Surgery  Before surgery, you can play an important role.  Because skin is not sterile, your skin needs to be as free of germs as possible.  You can reduce the number of germs on you skin by washing with CHG (chlorahexidine gluconate) soap before surgery.  CHG is an antiseptic cleaner which kills germs and bonds with the skin to continue killing germs even after washing.  Please DO NOT use if you have an allergy to CHG or antibacterial soaps.  If your skin becomes reddened/irritated stop using the CHG and inform your nurse when you arrive at Short Stay.  Do not shave (including legs and underarms) for at least 48 hours prior to the first CHG shower.  You may shave your  face.  Please follow these instructions carefully:   1.  Shower with CHG Soap the night before surgery and the                                morning of Surgery.  2.  If you choose to wash your hair, wash your hair first as usual with your       normal shampoo.  3.  After you shampoo, rinse your hair and body thoroughly to remove the                      Shampoo.  4.  Use CHG as you would any other liquid soap.  You can apply chg directly       to the skin and wash gently with scrungie or a clean washcloth.  5.  Apply the CHG Soap to your body ONLY FROM THE NECK DOWN.        Do not use on open wounds or open sores.  Avoid contact with your eyes,       ears, mouth and genitals (private parts).  Wash genitals (private parts)       with your normal soap.  6.  Wash thoroughly, paying special attention to the area where your surgery        will be performed.  7.  Thoroughly rinse your body with warm water from the neck down.  8.  DO NOT shower/wash with your normal soap after using and rinsing off       the CHG Soap.  9.  Pat yourself dry with a clean towel.            10.  Wear clean pajamas.            11.  Place clean sheets on your bed the night of your first shower and do not        sleep with pets.  Day of Surgery  Do not apply any lotions/deoderants the morning of surgery.  Please wear clean clothes to the hospital/surgery center.      Please read over the following fact sheets that you were given: Pain Booklet, Coughing and Deep Breathing and Surgical Site Infection Prevention

## 2014-05-10 ENCOUNTER — Encounter (HOSPITAL_COMMUNITY): Payer: Self-pay

## 2014-05-10 ENCOUNTER — Encounter (HOSPITAL_COMMUNITY): Payer: Medicare Other | Admitting: Anesthesiology

## 2014-05-10 ENCOUNTER — Encounter (HOSPITAL_COMMUNITY)
Admission: RE | Disposition: A | Discharge status: Home / Self Care | Payer: Self-pay | Source: Ambulatory Visit | Attending: General Surgery

## 2014-05-10 ENCOUNTER — Observation Stay (HOSPITAL_COMMUNITY)
Admission: RE | Admit: 2014-05-10 | Discharge: 2014-05-12 | Disposition: A | Discharge status: Home / Self Care | Payer: Medicare Other | Source: Ambulatory Visit | Attending: General Surgery | Admitting: General Surgery

## 2014-05-10 ENCOUNTER — Ambulatory Visit (HOSPITAL_COMMUNITY): Payer: Medicare Other | Admitting: Anesthesiology

## 2014-05-10 DIAGNOSIS — I890 Lymphedema, not elsewhere classified: Secondary | ICD-10-CM | POA: Insufficient documentation

## 2014-05-10 DIAGNOSIS — D6859 Other primary thrombophilia: Secondary | ICD-10-CM | POA: Insufficient documentation

## 2014-05-10 DIAGNOSIS — Z853 Personal history of malignant neoplasm of breast: Secondary | ICD-10-CM

## 2014-05-10 DIAGNOSIS — M1710 Unilateral primary osteoarthritis, unspecified knee: Secondary | ICD-10-CM | POA: Insufficient documentation

## 2014-05-10 DIAGNOSIS — Z01812 Encounter for preprocedural laboratory examination: Secondary | ICD-10-CM | POA: Diagnosis not present

## 2014-05-10 DIAGNOSIS — IMO0002 Reserved for concepts with insufficient information to code with codable children: Secondary | ICD-10-CM

## 2014-05-10 DIAGNOSIS — Z87891 Personal history of nicotine dependence: Secondary | ICD-10-CM | POA: Insufficient documentation

## 2014-05-10 DIAGNOSIS — Z452 Encounter for adjustment and management of vascular access device: Secondary | ICD-10-CM

## 2014-05-10 DIAGNOSIS — N6019 Diffuse cystic mastopathy of unspecified breast: Secondary | ICD-10-CM

## 2014-05-10 DIAGNOSIS — R920 Mammographic microcalcification found on diagnostic imaging of breast: Secondary | ICD-10-CM | POA: Diagnosis not present

## 2014-05-10 DIAGNOSIS — G8918 Other acute postprocedural pain: Secondary | ICD-10-CM | POA: Diagnosis not present

## 2014-05-10 DIAGNOSIS — Z923 Personal history of irradiation: Secondary | ICD-10-CM | POA: Insufficient documentation

## 2014-05-10 DIAGNOSIS — Z4001 Encounter for prophylactic removal of breast: Secondary | ICD-10-CM | POA: Diagnosis not present

## 2014-05-10 DIAGNOSIS — J45909 Unspecified asthma, uncomplicated: Secondary | ICD-10-CM | POA: Insufficient documentation

## 2014-05-10 DIAGNOSIS — K449 Diaphragmatic hernia without obstruction or gangrene: Secondary | ICD-10-CM | POA: Insufficient documentation

## 2014-05-10 DIAGNOSIS — I739 Peripheral vascular disease, unspecified: Secondary | ICD-10-CM | POA: Insufficient documentation

## 2014-05-10 DIAGNOSIS — K219 Gastro-esophageal reflux disease without esophagitis: Secondary | ICD-10-CM | POA: Insufficient documentation

## 2014-05-10 DIAGNOSIS — Z9010 Acquired absence of unspecified breast and nipple: Secondary | ICD-10-CM

## 2014-05-10 DIAGNOSIS — Z86718 Personal history of other venous thrombosis and embolism: Secondary | ICD-10-CM | POA: Insufficient documentation

## 2014-05-10 DIAGNOSIS — Z01818 Encounter for other preprocedural examination: Secondary | ICD-10-CM | POA: Insufficient documentation

## 2014-05-10 DIAGNOSIS — I499 Cardiac arrhythmia, unspecified: Secondary | ICD-10-CM | POA: Insufficient documentation

## 2014-05-10 DIAGNOSIS — Z9221 Personal history of antineoplastic chemotherapy: Secondary | ICD-10-CM | POA: Insufficient documentation

## 2014-05-10 DIAGNOSIS — I252 Old myocardial infarction: Secondary | ICD-10-CM | POA: Insufficient documentation

## 2014-05-10 DIAGNOSIS — Z7901 Long term (current) use of anticoagulants: Secondary | ICD-10-CM | POA: Insufficient documentation

## 2014-05-10 DIAGNOSIS — N6029 Fibroadenosis of unspecified breast: Secondary | ICD-10-CM | POA: Diagnosis not present

## 2014-05-10 HISTORY — DX: Lymphedema, not elsewhere classified: I89.0

## 2014-05-10 HISTORY — PX: MASTECTOMY: SHX3

## 2014-05-10 HISTORY — DX: Anemia, unspecified: D64.9

## 2014-05-10 HISTORY — PX: PORT-A-CATH REMOVAL: SHX5289

## 2014-05-10 HISTORY — DX: Other specified disorders of bone density and structure, unspecified site: M85.80

## 2014-05-10 HISTORY — PX: SIMPLE MASTECTOMY WITH AXILLARY SENTINEL NODE BIOPSY: SHX6098

## 2014-05-10 LAB — PROTIME-INR
INR: 0.97 (ref 0.00–1.49)
Prothrombin Time: 12.7 seconds (ref 11.6–15.2)

## 2014-05-10 SURGERY — SIMPLE MASTECTOMY
Anesthesia: Regional | Site: Chest

## 2014-05-10 MED ORDER — PROMETHAZINE HCL 25 MG/ML IJ SOLN: 6.2500 mg | INTRAMUSCULAR | Status: DC | PRN

## 2014-05-10 MED ORDER — OXYCODONE HCL 5 MG/5ML PO SOLN: 5.0000 mg | Freq: Once | ORAL | Status: DC | PRN

## 2014-05-10 MED ORDER — FENTANYL CITRATE 0.05 MG/ML IJ SOLN
INTRAMUSCULAR | Status: DC | PRN
  Administered 2014-05-10: 50 ug via INTRAVENOUS
  Administered 2014-05-10: 25 ug via INTRAVENOUS
  Administered 2014-05-10: 50 ug via INTRAVENOUS
  Administered 2014-05-10: 25 ug via INTRAVENOUS

## 2014-05-10 MED ORDER — PROPOFOL 10 MG/ML IV BOLUS
INTRAVENOUS | Status: DC | PRN
  Administered 2014-05-10: 40 mg via INTRAVENOUS
  Administered 2014-05-10: 160 mg via INTRAVENOUS

## 2014-05-10 MED ORDER — MONTELUKAST SODIUM 10 MG PO TABS
10.0000 mg | ORAL_TABLET | Freq: Every day | ORAL | Status: DC
Start: 2014-05-10 — End: 2014-05-12
  Administered 2014-05-10 – 2014-05-11 (×2): 10 mg via ORAL
  Filled 2014-05-10 (×4): qty 1

## 2014-05-10 MED ORDER — ONDANSETRON HCL 4 MG/2ML IJ SOLN: 4.0000 mg | Freq: Four times a day (QID) | INTRAMUSCULAR | Status: DC | PRN

## 2014-05-10 MED ORDER — CEFAZOLIN SODIUM-DEXTROSE 2-3 GM-% IV SOLR
INTRAVENOUS | Status: DC | PRN
  Administered 2014-05-10: 2 g via INTRAVENOUS

## 2014-05-10 MED ORDER — FENTANYL CITRATE 0.05 MG/ML IJ SOLN
INTRAMUSCULAR | Status: AC
  Filled 2014-05-10: qty 5

## 2014-05-10 MED ORDER — COUMADIN BOOK
1.0000 | Freq: Once | Status: DC
  Filled 2014-05-10: qty 1

## 2014-05-10 MED ORDER — ACETAMINOPHEN 650 MG RE SUPP: 650.0000 mg | Freq: Four times a day (QID) | RECTAL | Status: DC | PRN

## 2014-05-10 MED ORDER — WARFARIN - PHYSICIAN DOSING INPATIENT: Freq: Every day | Status: DC

## 2014-05-10 MED ORDER — 0.9 % SODIUM CHLORIDE (POUR BTL) OPTIME
TOPICAL | Status: DC | PRN
  Administered 2014-05-10 (×3): 1000 mL

## 2014-05-10 MED ORDER — MIDAZOLAM HCL 5 MG/5ML IJ SOLN
INTRAMUSCULAR | Status: DC | PRN
  Administered 2014-05-10: 2 mg via INTRAVENOUS

## 2014-05-10 MED ORDER — OXYCODONE HCL 5 MG PO TABS
5.0000 mg | ORAL_TABLET | ORAL | Status: DC | PRN
  Administered 2014-05-10 – 2014-05-11 (×3): 5 mg via ORAL
  Filled 2014-05-10 (×3): qty 1

## 2014-05-10 MED ORDER — EPHEDRINE SULFATE 50 MG/ML IJ SOLN
INTRAMUSCULAR | Status: AC
  Filled 2014-05-10: qty 1

## 2014-05-10 MED ORDER — PHENYLEPHRINE 40 MCG/ML (10ML) SYRINGE FOR IV PUSH (FOR BLOOD PRESSURE SUPPORT)
PREFILLED_SYRINGE | INTRAVENOUS | Status: AC
  Filled 2014-05-10: qty 10

## 2014-05-10 MED ORDER — ALBUTEROL SULFATE (2.5 MG/3ML) 0.083% IN NEBU: 2.5000 mg | INHALATION_SOLUTION | RESPIRATORY_TRACT | Status: DC | PRN

## 2014-05-10 MED ORDER — WARFARIN SODIUM 5 MG PO TABS: 5.0000 mg | ORAL_TABLET | ORAL | Status: DC

## 2014-05-10 MED ORDER — BUPIVACAINE HCL (PF) 0.5 % IJ SOLN
INTRAMUSCULAR | Status: DC | PRN
  Administered 2014-05-10: 30 mL via PERINEURAL

## 2014-05-10 MED ORDER — LIDOCAINE HCL (CARDIAC) 20 MG/ML IV SOLN
INTRAVENOUS | Status: AC
  Filled 2014-05-10: qty 5

## 2014-05-10 MED ORDER — CEFAZOLIN SODIUM-DEXTROSE 2-3 GM-% IV SOLR
2.0000 g | Freq: Three times a day (TID) | INTRAVENOUS | Status: DC
  Administered 2014-05-10 – 2014-05-12 (×6): 2 g via INTRAVENOUS
  Filled 2014-05-10 (×9): qty 50

## 2014-05-10 MED ORDER — ROCURONIUM BROMIDE 50 MG/5ML IV SOLN
INTRAVENOUS | Status: AC
  Filled 2014-05-10: qty 1

## 2014-05-10 MED ORDER — SODIUM CHLORIDE 0.9 % IV SOLN
INTRAVENOUS | Status: DC
  Administered 2014-05-10: 13:00:00 via INTRAVENOUS

## 2014-05-10 MED ORDER — ACETAMINOPHEN 160 MG/5ML PO SOLN
325.0000 mg | ORAL | Status: DC | PRN
  Filled 2014-05-10: qty 20.3

## 2014-05-10 MED ORDER — ACETAMINOPHEN 325 MG PO TABS: 325.0000 mg | ORAL_TABLET | ORAL | Status: DC | PRN

## 2014-05-10 MED ORDER — DEXAMETHASONE SODIUM PHOSPHATE 4 MG/ML IJ SOLN
INTRAMUSCULAR | Status: AC
  Filled 2014-05-10: qty 2

## 2014-05-10 MED ORDER — ACETAMINOPHEN 325 MG PO TABS: 650.0000 mg | ORAL_TABLET | Freq: Four times a day (QID) | ORAL | Status: DC | PRN

## 2014-05-10 MED ORDER — SUCCINYLCHOLINE CHLORIDE 20 MG/ML IJ SOLN
INTRAMUSCULAR | Status: AC
  Filled 2014-05-10: qty 1

## 2014-05-10 MED ORDER — MIDAZOLAM HCL 2 MG/2ML IJ SOLN
INTRAMUSCULAR | Status: AC
  Filled 2014-05-10: qty 2

## 2014-05-10 MED ORDER — WARFARIN VIDEO
1.0000 | Freq: Once | Status: AC
  Administered 2014-05-11: 1

## 2014-05-10 MED ORDER — WARFARIN SODIUM 5 MG PO TABS: 5.0000 mg | ORAL_TABLET | Freq: Every day | ORAL | Status: DC

## 2014-05-10 MED ORDER — ALBUTEROL SULFATE HFA 108 (90 BASE) MCG/ACT IN AERS: 2.0000 | INHALATION_SPRAY | RESPIRATORY_TRACT | Status: DC | PRN

## 2014-05-10 MED ORDER — GLYCOPYRROLATE 0.2 MG/ML IJ SOLN
INTRAMUSCULAR | Status: DC | PRN
  Administered 2014-05-10: 0.2 mg via INTRAVENOUS

## 2014-05-10 MED ORDER — ONDANSETRON HCL 4 MG/2ML IJ SOLN
INTRAMUSCULAR | Status: AC
  Filled 2014-05-10: qty 2

## 2014-05-10 MED ORDER — MORPHINE SULFATE 2 MG/ML IJ SOLN: 2.0000 mg | INTRAMUSCULAR | Status: DC | PRN

## 2014-05-10 MED ORDER — SODIUM CHLORIDE 0.9 % IJ SOLN
INTRAMUSCULAR | Status: AC
  Filled 2014-05-10: qty 10

## 2014-05-10 MED ORDER — EPHEDRINE SULFATE 50 MG/ML IJ SOLN
INTRAMUSCULAR | Status: DC | PRN
  Administered 2014-05-10: 10 mg via INTRAVENOUS
  Administered 2014-05-10: 5 mg via INTRAVENOUS

## 2014-05-10 MED ORDER — PHENYLEPHRINE HCL 10 MG/ML IJ SOLN
INTRAMUSCULAR | Status: DC | PRN
  Administered 2014-05-10 (×5): 80 ug via INTRAVENOUS

## 2014-05-10 MED ORDER — PROPOFOL 10 MG/ML IV BOLUS
INTRAVENOUS | Status: AC
  Filled 2014-05-10: qty 20

## 2014-05-10 MED ORDER — METOPROLOL SUCCINATE ER 50 MG PO TB24
50.0000 mg | ORAL_TABLET | Freq: Every day | ORAL | Status: DC
  Administered 2014-05-11 – 2014-05-12 (×2): 50 mg via ORAL
  Filled 2014-05-10 (×3): qty 1

## 2014-05-10 MED ORDER — OXYCODONE HCL 5 MG PO TABS: 5.0000 mg | ORAL_TABLET | Freq: Once | ORAL | Status: DC | PRN

## 2014-05-10 MED ORDER — LACTATED RINGERS IV SOLN
INTRAVENOUS | Status: DC | PRN
  Administered 2014-05-10 (×2): via INTRAVENOUS

## 2014-05-10 MED ORDER — ONDANSETRON HCL 4 MG/2ML IJ SOLN
INTRAMUSCULAR | Status: DC | PRN
  Administered 2014-05-10: 4 mg via INTRAVENOUS

## 2014-05-10 MED ORDER — FENTANYL CITRATE 0.05 MG/ML IJ SOLN
INTRAMUSCULAR | Status: AC
  Filled 2014-05-10: qty 2

## 2014-05-10 MED ORDER — WARFARIN SODIUM 7.5 MG PO TABS
7.5000 mg | ORAL_TABLET | ORAL | Status: DC
  Administered 2014-05-10: 7.5 mg via ORAL
  Filled 2014-05-10 (×2): qty 1

## 2014-05-10 MED ORDER — PANTOPRAZOLE SODIUM 40 MG PO TBEC
40.0000 mg | DELAYED_RELEASE_TABLET | Freq: Every day | ORAL | Status: DC
  Administered 2014-05-11 – 2014-05-12 (×2): 40 mg via ORAL
  Filled 2014-05-10 (×2): qty 1

## 2014-05-10 MED ORDER — FENTANYL CITRATE 0.05 MG/ML IJ SOLN
25.0000 ug | INTRAMUSCULAR | Status: DC | PRN
  Administered 2014-05-10 (×2): 50 ug via INTRAVENOUS

## 2014-05-10 MED ORDER — DEXAMETHASONE SODIUM PHOSPHATE 4 MG/ML IJ SOLN
INTRAMUSCULAR | Status: DC | PRN
  Administered 2014-05-10: 8 mg via INTRAVENOUS

## 2014-05-10 SURGICAL SUPPLY — 45 items
BINDER BREAST LRG (GAUZE/BANDAGES/DRESSINGS) IMPLANT
BINDER BREAST XLRG (GAUZE/BANDAGES/DRESSINGS) ×4 IMPLANT
BIOPATCH RED 1 DISK 7.0 (GAUZE/BANDAGES/DRESSINGS) ×6 IMPLANT
BIOPATCH RED 1IN DISK 7.0MM (GAUZE/BANDAGES/DRESSINGS) ×2
CHLORAPREP W/TINT 26ML (MISCELLANEOUS) ×4 IMPLANT
CLOSURE WOUND 1/2 X4 (GAUZE/BANDAGES/DRESSINGS) ×2
COVER SURGICAL LIGHT HANDLE (MISCELLANEOUS) ×4 IMPLANT
DERMABOND ADVANCED (GAUZE/BANDAGES/DRESSINGS) ×6
DERMABOND ADVANCED .7 DNX12 (GAUZE/BANDAGES/DRESSINGS) ×6 IMPLANT
DRAIN CHANNEL 19F RND (DRAIN) ×8 IMPLANT
DRAPE LAPAROSCOPIC ABDOMINAL (DRAPES) ×4 IMPLANT
DRAPE PED LAPAROTOMY (DRAPES) IMPLANT
DRAPE UTILITY 15X26 W/TAPE STR (DRAPE) ×12 IMPLANT
DRSG TEGADERM 4X4.75 (GAUZE/BANDAGES/DRESSINGS) ×4 IMPLANT
ELECT BLADE 4.0 EZ CLEAN MEGAD (MISCELLANEOUS) ×4
ELECT CAUTERY BLADE 6.4 (BLADE) ×4 IMPLANT
ELECT REM PT RETURN 9FT ADLT (ELECTROSURGICAL) ×4
ELECTRODE BLDE 4.0 EZ CLN MEGD (MISCELLANEOUS) ×2 IMPLANT
ELECTRODE REM PT RTRN 9FT ADLT (ELECTROSURGICAL) ×2 IMPLANT
EVACUATOR SILICONE 100CC (DRAIN) ×8 IMPLANT
GLOVE BIO SURGEON STRL SZ7 (GLOVE) ×4 IMPLANT
GLOVE BIOGEL PI IND STRL 6.5 (GLOVE) ×2 IMPLANT
GLOVE BIOGEL PI IND STRL 7.0 (GLOVE) ×2 IMPLANT
GLOVE BIOGEL PI IND STRL 7.5 (GLOVE) ×4 IMPLANT
GLOVE BIOGEL PI INDICATOR 6.5 (GLOVE) ×2
GLOVE BIOGEL PI INDICATOR 7.0 (GLOVE) ×2
GLOVE BIOGEL PI INDICATOR 7.5 (GLOVE) ×4
GLOVE ECLIPSE 7.5 STRL STRAW (GLOVE) ×4 IMPLANT
GLOVE SURG SS PI 7.0 STRL IVOR (GLOVE) ×4 IMPLANT
GOWN STRL REUS W/ TWL LRG LVL3 (GOWN DISPOSABLE) ×6 IMPLANT
GOWN STRL REUS W/TWL LRG LVL3 (GOWN DISPOSABLE) ×6
KIT BASIN OR (CUSTOM PROCEDURE TRAY) ×4 IMPLANT
KIT ROOM TURNOVER OR (KITS) ×4 IMPLANT
NS IRRIG 1000ML POUR BTL (IV SOLUTION) ×12 IMPLANT
PACK GENERAL/GYN (CUSTOM PROCEDURE TRAY) ×4 IMPLANT
PAD ARMBOARD 7.5X6 YLW CONV (MISCELLANEOUS) ×4 IMPLANT
SPECIMEN JAR X LARGE (MISCELLANEOUS) ×4 IMPLANT
SPONGE LAP 18X18 X RAY DECT (DISPOSABLE) ×8 IMPLANT
STAPLER VISISTAT 35W (STAPLE) ×4 IMPLANT
STRIP CLOSURE SKIN 1/2X4 (GAUZE/BANDAGES/DRESSINGS) ×6 IMPLANT
SUT ETHILON 2 0 FS 18 (SUTURE) ×8 IMPLANT
SUT MNCRL AB 4-0 PS2 18 (SUTURE) ×4 IMPLANT
SUT MON AB 4-0 PC3 18 (SUTURE) ×4 IMPLANT
SUT VIC AB 3-0 SH 8-18 (SUTURE) ×20 IMPLANT
TOWEL OR 17X26 10 PK STRL BLUE (TOWEL DISPOSABLE) ×4 IMPLANT

## 2014-05-10 NOTE — Interval H&P Note (Signed)
History and Physical Interval Note:  05/10/2014 8:05 AM  Jessica Wells  has presented today for surgery, with the diagnosis of history breast cancer  The various methods of treatment have been discussed with the patient and family. After consideration of risks, benefits and other options for treatment, the patient has consented to  Procedure(s): PROPHYLACTIC LEFT MASTECTOMY (Left) REMOVAL PORT-A-CATH (N/A) as a surgical intervention .  The patient's history has been reviewed, patient examined, no change in status, stable for surgery.  I have reviewed the patient's chart and labs.  Questions were answered to the patient's satisfaction.     [name]

## 2014-05-10 NOTE — Op Note (Signed)
Preoperative diagnosis: History of right breast inflammatory cancer, no longer needs venous access Postoperative diagnosis: Same as above Procedure: #1 left prophylactic total mastectomy #2 port removal Surgeon: Dr. Serita Grammes Anesthesia: Gen. With pectoral block Estimated blood loss: 50 cc Consultations: None Specimens: Left breast marked short stitch superior, long stitch lateral Drains: 2 19 French Blake drains Sponge count correct at completion Disposition to recovery stable  Indications: This is a 68 year old female who is otherwise healthy who underwent treatment of a right breast inflammatory cancer. She has completed that treatment and has no evidence of disease at this point. She has a lot of trouble with her left breast due to its size and is very much interested in a prophylactic mastectomy on that side. She would also like her port removed. We discussed these procedures prior to beginning.  Procedure: She first underwent a pectoral block by anesthesia. She was given cefazolin. Sequential compression devices were on her legs.  After informed consent was obtained she was then taken to the operating room. She was placed under general anesthesia with an LMA. She was then prepped and draped in the standard sterile surgical fashion. A surgical timeout was then performed.  I made an elliptical incision that removed a fair amount of her skin as well as her nipple areolar complex. I created flaps to the sternum, clavicle, inframammary crease, and latissimus. I removed the port through the superior aspect of this incision and sutured that tract with a 3-0 Vicryl suture. I then removed the breast from the pectoralis muscle including the pectoralis fascia. I oversewed all of the vessels that were bleeding during this portion. I then removed the breast in its entirety. I did trim the flaps a little bit more. Hemostasis was obtained. Irrigation was performed. I inserted the 2 19 [place] Blake  drains and secured these with 2-0 nylon suture. I then began to close the dermis with 3-0 Vicryl suture. The lateral portion had a lot of excess skin so I performed a V-Y plasty. I brought the V up to the closed incision and secured this with 3-0 Vicryl suture. I then removed additional skin at both of the Y areas. These were then closed with 3-0 Vicryl. 4-0 Monocryl was used to close the skin. I then placed Dermabond and Steri-Strips. A dressing was placed over the drains. A breast binder was placed. She tolerated this well was extubated and transferred to the recovery room stable.

## 2014-05-10 NOTE — Anesthesia Procedure Notes (Addendum)
Procedure Name: LMA Insertion Date/Time: 05/10/2014 8:32 AM Performed by: Rush Farmer E Pre-anesthesia Checklist: Patient identified, Emergency Drugs available, Suction available, Patient being monitored and Timeout performed Patient Re-evaluated:Patient Re-evaluated prior to inductionOxygen Delivery Method: Circle system utilized Preoxygenation: Pre-oxygenation with 100% oxygen Intubation Type: IV induction LMA: LMA inserted LMA Size: 4.0 Number of attempts: 1 Placement Confirmation: positive ETCO2 and breath sounds checked- equal and bilateral Tube secured with: Tape Dental Injury: Teeth and Oropharynx as per pre-operative assessment    Anesthesia Regional Block:  Pectoralis block  Pre-Anesthetic Checklist: ,, timeout performed, Correct Patient, Correct Site, Correct Laterality, Correct Procedure, Correct Position, site marked, Risks and benefits discussed,  Surgical consent,  Pre-op evaluation,  At surgeon's request and post-op pain management  Laterality: Upper and Left  Prep: chloraprep       Needles:  Injection technique: Single-shot  Needle Type: Echogenic Needle          Additional Needles:  Procedures: ultrasound guided (picture in chart) Pectoralis block Narrative:  Injection made incrementally with aspirations every 5 mL.  Performed by: Personally  Anesthesiologist: Omega Slager  Additional Notes: H+P and labs reviewed, risks and benefits discussed with patient, procedure tolerated well without complications

## 2014-05-10 NOTE — Anesthesia Preprocedure Evaluation (Addendum)
Anesthesia Evaluation  Patient identified by MRN, date of birth, ID band Patient awake    Reviewed: Allergy & Precautions, H&P , NPO status , Patient's Chart, lab work & pertinent test results  History of Anesthesia Complications (+) PONV and history of anesthetic complications  Airway Mallampati: II TM Distance: >3 FB Neck ROM: Full    Dental  (+) Teeth Intact   Pulmonary asthma , neg sleep apnea, neg recent URI, former smoker,  breath sounds clear to auscultation        Cardiovascular - angina+ Peripheral Vascular Disease - Past MI + dysrhythmias Supra Ventricular Tachycardia Rhythm:Regular     Neuro/Psych negative neurological ROS  negative psych ROS   GI/Hepatic Neg liver ROS, hiatal hernia, GERD-  Medicated and Controlled,  Endo/Other  negative endocrine ROS  Renal/GU negative Renal ROS     Musculoskeletal   Abdominal   Peds  Hematology H/o blood clots   Anesthesia Other Findings   Reproductive/Obstetrics                         Anesthesia Physical Anesthesia Plan  ASA: II  Anesthesia Plan: General and Regional   Post-op Pain Management:    Induction: Intravenous  Airway Management Planned: LMA  Additional Equipment: None  Intra-op Plan:   Post-operative Plan: Extubation in OR  Informed Consent: I have reviewed the patients History and Physical, chart, labs and discussed the procedure including the risks, benefits and alternatives for the proposed anesthesia with the patient or authorized representative who has indicated his/her understanding and acceptance.   Dental advisory given  Plan Discussed with: CRNA and Surgeon  Anesthesia Plan Comments:        Anesthesia Quick Evaluation

## 2014-05-10 NOTE — Anesthesia Postprocedure Evaluation (Signed)
  Anesthesia Post-op Note  Patient: Jessica Wells  Procedure(s) Performed: Procedure(s): PROPHYLACTIC LEFT MASTECTOMY (Left) REMOVAL PORT-A-CATH (N/A)  Patient Location: PACU  Anesthesia Type:General and Regional  Level of Consciousness: awake and alert   Airway and Oxygen Therapy: Patient Spontanous Breathing  Post-op Pain: mild  Post-op Assessment: Post-op Vital signs reviewed, Patient's Cardiovascular Status Stable, Respiratory Function Stable, Patent Airway, No signs of Nausea or vomiting and Pain level controlled  Post-op Vital Signs: Reviewed and stable  Last Vitals:  Filed Vitals:   05/10/14 1225  BP: 127/69  Pulse: 72  Temp: 36 C  Resp: 14    Complications: No apparent anesthesia complications

## 2014-05-10 NOTE — H&P (View-Only) (Signed)
Patient ID: Jessica Wells, female   DOB: Feb 07, 1946, 68 y.o.   MRN: [id number]  Chief Complaint  Patient presents with  . left breast follow-up    HPI Jessica Wells is a 68 y.o. female.   HPI This is a 68 year-old female I know well from a right modified radical mastectomy for inflammatory breast cancer. This is HER-2/neu amplified. She underwent primary chemotherapy followed by surgery. She then underwent radiation therapy following that. Herceptin was completed in November of 2014. She had a mammogram on the left side in November that was negative. She had CT chest abdomen pelvis that were negative recently as well. She has no real complaints today. She is being treated for rue lymphedema which is not surprising. She has a lot of trouble with her left breast due to size and pain with bra. She is also concerned about developing another cancer on the left side.  She comes in today to discuss a possible mastectomy on that left side.  She does not want to consider a reduction anymore.  Past Medical History  Diagnosis Date  . GERD (gastroesophageal reflux disease)   . Dysrhythmia     PAT-sees dr Lina Sayre meds  . Allergy   . PAT (paroxysmal atrial tachycardia)     hx  . Arthritis     osteopenia,knees  . PONV (postoperative nausea and vomiting) 09-13-12    severe, with Port-a-cath, was managed without PONV  . Clotting disorder     prothrombin gene mutation-heterozygous   . History of chemotherapy     Last dose to be 02-04-13.  Was rx'd with Herceptin & Gemzar  . DVT (deep venous thrombosis)   . Asthma     Uses Inhalers Proventil as needed  . Bursitis 01-25-13    Rt. shoulder- mildly affected now.  . History of blood transfusion 01-25-13    2 units 01-11-13  . Pneumonia 01-25-13    hx. 12-27-12-hospital stay x 9 days, now resolved.  . S/P radiation therapy 4-12 wks ago 03/31/13-05/17/13    right breast/supraclavicular fossa/posterior axillary boost  . Breast cancer 07/14/12    inflammatory right  breast ca, ER/PR -    Past Surgical History  Procedure Laterality Date  . Dilation and curettage of uterus      x3  . Tonsillectomy    . Colonoscopy    . Portacath placement  07/21/2012    Procedure: INSERTION PORT-A-CATH;  Surgeon: Rolm Bookbinder, MD;  Location: [facility];  Service: General;  Laterality: Left;/ Replacement done 10'13  . Insertion of vena cava filter  12/2012  . Giant cell tumor  01-25-13    removed Rt. forearm.  . Eye lid surgery      right( MD office)  . Mastectomy modified radical Right 02/08/2013    Procedure: MASTECTOMY MODIFIED RADICAL;  Surgeon: Rolm Bookbinder, MD;  Location: [facility];  Service: General;  Laterality: Right;  . Breast surgery      Family History  Problem Relation Age of Onset  . Diabetes Mother   . Heart disease Mother   . Hypertension Mother   . Hyperlipidemia Mother   . Breast cancer Mother 66    lobular breast cancer  . Heart disease Maternal Aunt   . Heart disease Maternal Uncle   . Heart disease Maternal Grandmother     died in her 83s from heart disease  . Lung cancer Father 75    adenocarcinoma  . Cancer Paternal Uncle     dx  in mid 63s with a cancer in the leg, died late 22s; grandpaternal half uncle    Social History History  Substance Use Topics  . Smoking status: Former Smoker -- 1.00 packs/day for 35 years    Types: Cigarettes    Quit date: 07/21/1979  . Smokeless tobacco: Never Used  . Alcohol Use: No    Allergies  Allergen Reactions  . Anesthetics, Amide Nausea And Vomiting    Projectile vomitting-Nausea- with 24hrs of dry heaves. With any anesthetics  . Bactrim [Sulfamethoxazole-Trimethoprim] Other (See Comments)    Massive diarrhea, nausea vomiting, platelets dropped, hemoglobin dropped, admitted to hospital  . Sulfa Antibiotics Nausea And Vomiting and Other (See Comments)    Crazy feeling in head Life threatening reaction, decreased blood counts  . Codeine Nausea And Vomiting  . Codeine  Phosphate Nausea And Vomiting  . Eggs Or Egg-Derived Products Hives    Current Outpatient Prescriptions  Medication Sig Dispense Refill  . b complex vitamins tablet Take 1 tablet by mouth daily.      . calcium-vitamin D (OSCAL WITH D) 500-200 MG-UNIT per tablet Take 1 tablet by mouth every other day.       . Ferrous Sulfate (SLOW FE PO) Take 1 tablet by mouth every other day.      . fish oil-omega-3 fatty acids 1000 MG capsule Take 2 g by mouth daily.      . Flaxseed, Linseed, (FLAX SEEDS PO) Take 1 tablet by mouth daily.      Marland Kitchen glucosamine-chondroitin 500-400 MG tablet Take 1 tablet by mouth daily.      . metoprolol succinate (TOPROL-XL) 50 MG 24 hr tablet Take 1 tablet (50 mg total) by mouth daily before breakfast. Take with or immediately following a meal.  30 tablet  5  . montelukast (SINGULAIR) 10 MG tablet Take 10 mg by mouth at bedtime.      . Multiple Vitamin (MULTIVITAMIN WITH MINERALS) TABS Take 1 tablet by mouth every other day.       Marland Kitchen omeprazole (PRILOSEC) 20 MG capsule Take 20 mg by mouth daily. For heartburn      . PROVENTIL HFA 108 (90 BASE) MCG/ACT inhaler Inhale 2 puffs into the lungs every 4 (four) hours as needed.       . warfarin (COUMADIN) 5 MG tablet 7.67m 5 nights per week, then 540m2 nights per week.  40 tablet  2   No current facility-administered medications for this visit.    Review of Systems Review of Systems  Constitutional: Negative for fever, chills and unexpected weight change.  HENT: Negative for congestion, hearing loss, sore throat, trouble swallowing and voice change.   Eyes: Negative for visual disturbance.  Respiratory: Negative for cough and wheezing.   Cardiovascular: Negative for chest pain, palpitations and leg swelling.  Gastrointestinal: Negative for nausea, vomiting, abdominal pain, diarrhea, constipation, blood in stool, abdominal distention and anal bleeding.  Genitourinary: Negative for hematuria, vaginal bleeding and difficulty  urinating.  Musculoskeletal: Negative for arthralgias.  Skin: Negative for rash and wound.  Neurological: Negative for seizures, syncope and headaches.  Hematological: Negative for adenopathy. Does not bruise/bleed easily.  Psychiatric/Behavioral: Negative for confusion.    Blood pressure 126/70, pulse 69, temperature 97.2 F (36.2 C), resp. rate 16, height 5' 1"  (1.549 m), weight 151 lb (68.493 kg).  Physical Exam Physical Exam  Vitals reviewed. Constitutional: She appears well-developed and well-nourished.  Eyes: No scleral icterus.  Neck: Neck supple.  Cardiovascular: Normal rate and normal heart  sounds.   Pulmonary/Chest: Effort normal and breath sounds normal. She has no wheezes. She has no rales. Right breast exhibits no mass and no tenderness. Left breast exhibits no inverted nipple, no mass, no nipple discharge, no skin change and no tenderness.    Lymphadenopathy:    She has no cervical adenopathy.    She has no axillary adenopathy.       Right: No supraclavicular adenopathy present.       Left: No supraclavicular adenopathy present.    Data Reviewed Prior mm and oncology notes, recent ct c/a/p  Assessment    History right breast inflammatory cancer      Plan    She understands that a prophylactic left mastectomy is not 100% preventive and she can have a cancer develop there.  She also understands that this will not affect her survival. She does however really want to pursue this as it is an issue with clothes, back etc and she is also concerned about remaining breast tissue.  I think this is not unreasonable given her response pathologically to chemo as well as recent ct scans.  We will need cardiology clearance, mgt of coumadin by heme given dvt history she will need to be on lovenox bridge.  I will wait on these then discuss scheduling. We discussed the risks of operation including bleeding, infection, possible reoperation among others.          Rolm Bookbinder 04/25/2014, 11:00 AM

## 2014-05-10 NOTE — Transfer of Care (Signed)
Immediate Anesthesia Transfer of Care Note  Patient: Jessica Wells  Procedure(s) Performed: Procedure(s): PROPHYLACTIC LEFT MASTECTOMY (Left) REMOVAL PORT-A-CATH (N/A)  Patient Location: PACU  Anesthesia Type:General  Level of Consciousness: awake, alert  and oriented  Airway & Oxygen Therapy: Patient Spontanous Breathing and Patient connected to nasal cannula oxygen  Post-op Assessment: Report given to PACU RN, Post -op Vital signs reviewed and stable and Patient moving all extremities X 4  Post vital signs: Reviewed and stable  Complications: No apparent anesthesia complications

## 2014-05-11 ENCOUNTER — Encounter (HOSPITAL_COMMUNITY): Payer: Self-pay | Admitting: General Surgery

## 2014-05-11 LAB — BASIC METABOLIC PANEL
BUN: 16 mg/dL (ref 6–23)
CHLORIDE: 105 meq/L (ref 96–112)
CO2: 27 mEq/L (ref 19–32)
Calcium: 8.6 mg/dL (ref 8.4–10.5)
Creatinine, Ser: 0.84 mg/dL (ref 0.50–1.10)
GFR calc non Af Amer: 69 mL/min — ABNORMAL LOW (ref >90)
GFR, EST AFRICAN AMERICAN: 80 mL/min — AB (ref >90)
GLUCOSE: 136 mg/dL — AB (ref 70–99)
Potassium: 4.1 mEq/L (ref 3.7–5.3)
Sodium: 141 mEq/L (ref 137–147)

## 2014-05-11 LAB — CBC
HCT: 30.5 % — ABNORMAL LOW (ref 36.0–46.0)
Hemoglobin: 10.1 g/dL — ABNORMAL LOW (ref 12.0–15.0)
MCH: 29.5 pg (ref 26.0–34.0)
MCHC: 33.1 g/dL (ref 30.0–36.0)
MCV: 89.2 fL (ref 78.0–100.0)
Platelets: 172 10*3/uL (ref 150–400)
RBC: 3.42 MIL/uL — ABNORMAL LOW (ref 3.87–5.11)
RDW: 13 % (ref 11.5–15.5)
WBC: 9.1 10*3/uL (ref 4.0–10.5)

## 2014-05-11 LAB — PROTIME-INR
INR: 1.05 (ref 0.00–1.49)
Prothrombin Time: 13.5 seconds (ref 11.6–15.2)

## 2014-05-11 MED ORDER — ENOXAPARIN SODIUM 100 MG/ML ~~LOC~~ SOLN
100.0000 mg | SUBCUTANEOUS | Status: DC
  Administered 2014-05-11 – 2014-05-12 (×2): 100 mg via SUBCUTANEOUS
  Filled 2014-05-11 (×3): qty 1

## 2014-05-11 MED ORDER — ENOXAPARIN SODIUM 120 MG/0.8ML ~~LOC~~ SOLN
1.5000 mg/kg | SUBCUTANEOUS | Status: DC
  Filled 2014-05-11 (×2): qty 0.8

## 2014-05-11 MED ORDER — ENOXAPARIN SODIUM 80 MG/0.8ML ~~LOC~~ SOLN: 1.0000 mg/kg | Freq: Two times a day (BID) | SUBCUTANEOUS | Status: DC

## 2014-05-11 NOTE — Progress Notes (Signed)
UR completed 

## 2014-05-11 NOTE — Progress Notes (Signed)
1 Day Post-Op  Subjective: Feels fine, tol diet, ambulating, no n/v, pain controlled  Objective: Vital signs in last 24 hours: Temp:  [96.8 F (36 C)-98.4 F (36.9 C)] 98.1 F (36.7 C) (06/11 0533) Pulse Rate:  [58-76] 64 (06/11 0533) Resp:  [9-20] 16 (06/11 0533) BP: (106-128)/(49-76) 115/59 mmHg (06/11 0533) SpO2:  [94 %-100 %] 99 % (06/11 0533) Weight:  [150 lb 11 oz (68.351 kg)] 150 lb 11 oz (68.351 kg) (06/10 0715) Last BM Date: 05/10/14  Intake/Output from previous day: 06/10 0701 - 06/11 0700 In: 3040 [P.O.:240; I.V.:2800] Out: 3040 [Urine:2800; Drains:190; Blood:50] Intake/Output this shift: Total I/O In: 1650 [I.V.:1650] Out: 1840 [Urine:1750; Drains:90]  General appearance: no distress Incision/Wound:left mastectomy wound without infection, no hematoma, drains serosang  Lab Results:   Recent Labs  05/08/14 1018 05/11/14 0438  WBC 5.7 9.1  HGB 12.2 10.1*  HCT 35.4 30.5*  PLT 196 172   BMET  Recent Labs  05/08/14 1019 05/11/14 0438  NA 141 141  K 4.0 4.1  CL  --  105  CO2 28 27  GLUCOSE 93 136*  BUN 18.9 16  CREATININE 0.9 0.84  CALCIUM 9.4 8.6   PT/INR  Recent Labs  05/10/14 0701 05/11/14 0438  LABPROT 12.7 13.5  INR 0.97 1.05   ABG No results found for this basename: PHART, PCO2, PO2, HCO3,  in the last 72 hours  Studies/Results: Dg Chest 2 View  05/09/2014   CLINICAL DATA:  Preop for left mastectomy  EXAM: CHEST  2 VIEW  COMPARISON:  04/03/2014  FINDINGS: The patient is status post right mastectomy. No acute infiltrate or pleural effusion. No pulmonary edema. Surgical clips are noted in right axilla. IVC filter partially visualized. Left subclavian Port-A-Cath with tip in SVC right atrium junction. No acute infiltrate or pulmonary edema. Osteopenia and mild degenerative changes thoracic spine.  IMPRESSION: No active disease. Status post right mastectomy and right axillary lymph node dissection. IVC filter partially visualized.    Electronically Signed   By: Lahoma Crocker M.D.   On: 05/09/2014 16:30    Anti-infectives: Anti-infectives   Start     Dose/Rate Route Frequency Ordered Stop   05/10/14 1400  ceFAZolin (ANCEF) IVPB 2 g/50 mL premix     2 g 100 mL/hr over 30 Minutes Intravenous 3 times per day 05/10/14 1229     05/10/14 0600  ceFAZolin (ANCEF) IVPB 2 g/50 mL premix     2 g 100 mL/hr over 30 Minutes Intravenous On call to O.R. 05/09/14 1442 05/10/14 0835      Assessment/Plan: Pod 1 left total mastectomy, port removal 1. Oral pain meds 2. pulm toilet 3. Start lovenox today at tx dose, will monitor for 24 hours then dc tomorrow if no bleeding, will hold coumadin for now   [facility] 05/11/2014

## 2014-05-11 NOTE — Progress Notes (Signed)
OT Note  Patient seen 1400-1520 for Lymphedema management. Patient has been seen by Outpatient Rehab PT for lymphedema management > RUE. Please refer to outpatient evaluation for more details/specific regarding this case. Patient seen today per Galvin Proffer request. Dr. Donne Hazel ordered PT/OT specifically for lymphedema management. Plan is for patient to discharge > home tomorrow.  Upon entering room, patient found supine in bed with compression wrapping > RUE. Therapist doffed wrap and patient washed RUE. While patient in supine, MLD performed > UE. No neck or abdominal technqiues performed secondary to recent surgery (left mastectomy). After MLD, patient applied lotion to arm and sat EOB for therapist to perform compression wrapping using her materials. Therapist encouraged patient to perform exercises post wrapping to help with decreaseing lymphedema. Patient stated wrapping felt "good", not too tight or too loose.   Recommend patient be followed by CLT tomorrow prior to discharge > home. Per patient report, she has an appointment with Serafina Royals, PT Monday 6/15 for additional lymphedema treatment in Outpatient setting.  Any concerns or questions please call, Chrys Racer [phone]).  Chrys Racer, MS, OTR/L, CLT

## 2014-05-11 NOTE — Progress Notes (Addendum)
ANTICOAGULATION CONSULT NOTE - Initial Consult  Pharmacy Consult for Lovenox  Indication: DVT, h/o   Allergies  Allergen Reactions  . Anesthetics, Amide Nausea And Vomiting    Projectile vomitting-Nausea- with 24hrs of dry heaves. With any anesthetics  . Bactrim [Sulfamethoxazole-Trimethoprim] Other (See Comments)    Massive diarrhea, nausea vomiting, platelets dropped, hemoglobin dropped, admitted to hospital  . Sulfa Antibiotics Nausea And Vomiting and Other (See Comments)    Crazy feeling in head Life threatening reaction, decreased blood counts  . Codeine Nausea And Vomiting  . Codeine Phosphate Nausea And Vomiting  . Eggs Or Egg-Derived Products Hives    Patient Measurements: Height: 5\' 1"  (154.9 cm) Weight: 150 lb 11 oz (68.351 kg) IBW/kg (Calculated) : 47.8  Vital Signs: Temp: 98.1 F (36.7 C) (06/11 0533) Temp src: Oral (06/11 0533) BP: 115/59 mmHg (06/11 0533) Pulse Rate: 64 (06/11 0533)  Labs:  Recent Labs  05/08/14 1018 05/08/14 1018 05/08/14 1019 05/10/14 0701 05/11/14 0438  HGB  --  12.2  --   --  10.1*  HCT  --  35.4  --   --  30.5*  PLT  --  196  --   --  172  LABPROT  --   --   --  12.7 13.5  INR 1.20*  --   --  0.97 1.05  CREATININE  --   --  0.9  --  0.84    Estimated Creatinine Clearance: 55.9 ml/min (by C-G formula based on Cr of 0.84).   Medical History: Past Medical History  Diagnosis Date  . GERD (gastroesophageal reflux disease)   . Allergy   . PAT (paroxysmal atrial tachycardia)     hx  . PONV (postoperative nausea and vomiting) 09-13-12    severe, with Port-a-cath, was managed without PONV  . Clotting disorder     prothrombin gene mutation-heterozygous   . History of chemotherapy     Last dose to be 02-04-13.  Was rx'd with Herceptin & Gemzar  . DVT (deep venous thrombosis) 2014    BLE  . Asthma     Uses Inhalers Proventil as needed  . Bursitis     "both shoulders"  . History of blood transfusion 01-25-13    2 units 01-11-13  ("after chem")  . S/P radiation therapy 4-12 wks ago 03/31/13-05/17/13    right breast/supraclavicular fossa/posterior axillary boost  . Breast cancer 07/14/12    inflammatory right breast ca, ER/PR -  . H/O hiatal hernia   . Dysrhythmia     PAT-sees dr Lina Sayre meds  . Pneumonia 01-25-13    hx. 12-27-12-hospital stay x 9 days, now resolved.  . Pneumonia     "episodic; all my life" (05/10/2014)  . Anemia     "after chemo"  . Arthritis     "knees; fingers; occasionally" (05/10/2014)  . Osteopenia     "lower spine only" (05/10/2014)  . Lymphedema     RUE    Assessment: 68 y/o F POD #1 mastectomy/port removal, to start full dose Lovenox, holding warfarin for now, Hgb 10.1 this AM, renal function appropriate for age, other labs as above.   Goal of Therapy:  Monitor platelets by anticoagulation protocol: Yes   Plan:  -Lovenox 1.5mg /kg q24h (pt already has prescription for this at home with plans to f/u for warfarin re-start on 05/24/14) -Minimum q72h CBC while on Lovenox  -Monitor closely for post-op bleeding  Narda Bonds 05/11/2014,6:57 AM

## 2014-05-12 ENCOUNTER — Encounter: Payer: Medicare Other | Admitting: Physical Therapy

## 2014-05-12 LAB — CBC
HCT: 31.7 % — ABNORMAL LOW (ref 36.0–46.0)
Hemoglobin: 10.5 g/dL — ABNORMAL LOW (ref 12.0–15.0)
MCH: 29.7 pg (ref 26.0–34.0)
MCHC: 33.1 g/dL (ref 30.0–36.0)
MCV: 89.8 fL (ref 78.0–100.0)
Platelets: 163 10*3/uL (ref 150–400)
RBC: 3.53 MIL/uL — AB (ref 3.87–5.11)
RDW: 13.1 % (ref 11.5–15.5)
WBC: 6 10*3/uL (ref 4.0–10.5)

## 2014-05-12 MED ORDER — OXYCODONE HCL 5 MG PO TABS: 5.0000 mg | ORAL_TABLET | ORAL | Status: DC | PRN

## 2014-05-12 NOTE — Progress Notes (Signed)
Discussed discharge summary with pt. Pt received Rx. Pt did not have any further questions. Pt ready for discharge.

## 2014-05-12 NOTE — Discharge Instructions (Signed)
[facility] °[phone] ° °MASTECTOMY: POST OP INSTRUCTIONS ° °Always review your discharge instruction sheet given to you by the facility where your surgery was performed. °IF YOU HAVE DISABILITY OR FAMILY LEAVE FORMS, YOU MUST BRING THEM TO THE OFFICE FOR PROCESSING.   °DO NOT GIVE THEM TO YOUR DOCTOR. °A prescription for pain medication may be given to you upon discharge.  Take your pain medication as prescribed, if needed.  If narcotic pain medicine is not needed, then you may take acetaminophen (Tylenol), naprosyn (Alleve) or ibuprofen (Advil) as needed. °1. Take your usually prescribed medications unless otherwise directed. °2. If you need a refill on your pain medication, please contact your pharmacy.  They will contact our office to request authorization.  Prescriptions will not be filled after 5pm or on week-ends. °3. You should follow a light diet the first few days after arrival home, such as soup and crackers, etc.  Resume your normal diet the day after surgery. °4. Most patients will experience some swelling and bruising on the chest and underarm.  Ice packs will help.  Swelling and bruising can take several days to resolve. Wear the binder day and night until you return to the office.  °5. It is common to experience some constipation if taking pain medication after surgery.  Increasing fluid intake and taking a stool softener (such as Colace) will usually help or prevent this problem from occurring.  A mild laxative (Milk of Magnesia or Miralax) should be taken according to package instructions if there are no bowel movements after 48 hours. °6. Unless discharge instructions indicate otherwise, leave your bandage dry and in place until your next appointment in 3-5 days.  You may take a limited sponge bath.  No tube baths or showers until the drains are removed.  You may have steri-strips (small skin tapes) in place directly over the incision.  These strips should be left on the  skin for 7-10 days. If you have glue it will come off in next couple week.  Any sutures will be removed at an office visit °7. DRAINS:  If you have drains in place, it is important to keep a list of the amount of drainage produced each day in your drains.  Before leaving the hospital, you should be instructed on drain care.  Call our office if you have any questions about your drains. I will remove your drains when they put out less than 30 cc or ml for 2 consecutive days. °8. ACTIVITIES:  You may resume regular (light) daily activities beginning the next day--such as daily self-care, walking, climbing stairs--gradually increasing activities as tolerated.  You may have sexual intercourse when it is comfortable.  Refrain from any heavy lifting or straining until approved by your doctor. °a. You may drive when you are no longer taking prescription pain medication, you can comfortably wear a seatbelt, and you can safely maneuver your car and apply brakes. °b. RETURN TO WORK:  __________________________________________________________ °9. You should see your doctor in the office for a follow-up appointment approximately 3-5 days after your surgery.  Your doctor’s nurse will typically make your follow-up appointment when she calls you with your pathology report.  Expect your pathology report 3-4business days after surgery. °10. OTHER INSTRUCTIONS: ______________________________________________________________________________________________ ____________________________________________________________________________________________ °WHEN TO CALL YOUR DR Maudean Hoffmann: °1. Fever over 101.0 °2. Nausea and/or vomiting °3. Extreme swelling or bruising °4. Continued bleeding from incision. °5. Increased pain, redness, or drainage from the incision. °The clinic staff is available   to answer your questions during regular business hours.  Please don’t hesitate to call and ask to speak to one of the nurses for clinical concerns.  If  you have a medical emergency, go to the nearest emergency room or call 911.  A surgeon from [facility] is always on call at the hospital. °[address], [address], [place], [place]  [address] ? [address] [address], [place], [place]   [address] °[phone] ? [phone] ? FAX [phone] °Web site: [email or web address] ° °

## 2014-05-12 NOTE — Discharge Summary (Signed)
Physician Discharge Summary  Patient ID: Jessica Wells MRN: [id number] DOB/AGE: 1946-08-20 68 y.o.  Admit date: 05/10/2014 Discharge date: 05/12/2014  Admission Diagnoses: History right breast inflammatory cancer History VTE  Discharge Diagnoses:  Active Problems:   S/P mastectomy   Discharged Condition: good  Hospital Course: 67 yof I know well from treatment of right breast inflammatory cancer treated with primary chemo, mrm (no disease left), and radiotherapy.  She has remained without disease. She has RUE lymphedema being treated.  She wanted prophylactic left mastectomy due to size/back issues.  She underwent left total mastectomy and port removal off anticoagulation which she did well from. She had anticoagulation started pod one and has done well. She has no hematoma and will be discharged home doing well.  Consults: None  Significant Diagnostic Studies: none  Treatments: surgery: left total mastectomy, port removal  Discharge Exam: Blood pressure 146/64, pulse 75, temperature 97.7 F (36.5 C), temperature source Tympanic, resp. rate 16, height 5\' 1"  (1.549 m), weight 150 lb 11 oz (68.351 kg), SpO2 96.00%. General appearance: no distress Resp: clear to auscultation bilaterally Cardio: regular rate and rhythm Incision/Wound:mild swelling at superior flap, incision clean without infection, drains serous  Disposition: 01-Home or Self Care     Medication List    STOP taking these medications       warfarin 5 MG tablet  Commonly known as:  COUMADIN      TAKE these medications       calcium-vitamin D 500-200 MG-UNIT per tablet  Commonly known as:  OSCAL WITH D  Take 1 tablet by mouth every other day.     enoxaparin 100 MG/ML injection  Commonly known as:  LOVENOX  Inject 1 mL (100 mg total) into the skin daily.     fish oil-omega-3 fatty acids 1000 MG capsule  Take 1 g by mouth daily.     FLAX SEEDS PO  Take 1 tablet by mouth daily.     glucosamine-chondroitin 500-400 MG tablet  Take 1 tablet by mouth daily.     metoprolol succinate 50 MG 24 hr tablet  Commonly known as:  TOPROL-XL  Take 1 tablet (50 mg total) by mouth daily before breakfast. Take with or immediately following a meal.     multivitamin with minerals Tabs tablet  Take 1 tablet by mouth every other day.     omeprazole 20 MG capsule  Commonly known as:  PRILOSEC  Take 20 mg by mouth daily. For heartburn     oxyCODONE 5 MG immediate release tablet  Commonly known as:  Oxy IR/ROXICODONE  Take 1 tablet (5 mg total) by mouth every 4 (four) hours as needed for moderate pain.     PROVENTIL HFA 108 (90 BASE) MCG/ACT inhaler  Generic drug:  albuterol  Inhale 2 puffs into the lungs every 4 (four) hours as needed.     SINGULAIR 10 MG tablet  Generic drug:  montelukast  Take 10 mg by mouth at bedtime.     SLOW FE PO  Take 1 tablet by mouth every other day.     SUPER B COMPLEX PO  Take 1 tablet by mouth daily.     vitamin C 500 MG tablet  Commonly known as:  ASCORBIC ACID  Take 500 mg by mouth daily.     vitamin E 1000 UNIT capsule  Take 1,000 Units by mouth daily.           Follow-up Information   Follow up with Rolm Bookbinder, MD In  1 week.   Specialty:  General Surgery   Contact information:   [address] [place] [address] [phone]       Signed: Rolm Bookbinder 05/12/2014, 8:15 PM

## 2014-05-12 NOTE — Progress Notes (Signed)
Lymphedema Note   Patient scheduled for discharge today and follow up at [facility] on Monday 6/15.  Checked patient's bandages from yesterday.  Bandages slightly loosened and decided to re-wrap right UE to provide appropriate compression.  Bandages re-wrapped and reviewed reasons to remove bandages with patient.  She voiced understanding.  Patient to follow up at [facility] on Monday.  05/12/2014 [place], [place]  Time in/out:  955 - 1009 Charges:  1 TA

## 2014-05-15 ENCOUNTER — Telehealth: Payer: Self-pay | Admitting: Adult Health

## 2014-05-15 ENCOUNTER — Ambulatory Visit: Payer: Medicare Other | Admitting: Physical Therapy

## 2014-05-15 ENCOUNTER — Telehealth (INDEPENDENT_AMBULATORY_CARE_PROVIDER_SITE_OTHER): Payer: Self-pay | Admitting: General Surgery

## 2014-05-15 DIAGNOSIS — IMO0001 Reserved for inherently not codable concepts without codable children: Secondary | ICD-10-CM | POA: Diagnosis not present

## 2014-05-15 NOTE — Telephone Encounter (Signed)
Message copied by Flossie Buffy on Mon May 15, 2014  9:41 AM ------      Message from: [place], [place]      Created: Fri May 12, 2014  8:15 PM       Can i see her next Thursday also. ------

## 2014-05-15 NOTE — Telephone Encounter (Signed)
, °

## 2014-05-15 NOTE — Telephone Encounter (Signed)
Informed patient of her appt on 05/18/14 at 3:30.

## 2014-05-17 ENCOUNTER — Ambulatory Visit: Payer: Medicare Other

## 2014-05-17 DIAGNOSIS — IMO0001 Reserved for inherently not codable concepts without codable children: Secondary | ICD-10-CM | POA: Diagnosis not present

## 2014-05-18 ENCOUNTER — Encounter (INDEPENDENT_AMBULATORY_CARE_PROVIDER_SITE_OTHER): Payer: Self-pay | Admitting: General Surgery

## 2014-05-18 ENCOUNTER — Ambulatory Visit (INDEPENDENT_AMBULATORY_CARE_PROVIDER_SITE_OTHER): Payer: Medicare Other | Admitting: General Surgery

## 2014-05-18 VITALS — BP 122/70 | HR 82 | Temp 98.8°F | Resp 14 | Ht 61.0 in | Wt 145.6 lb

## 2014-05-18 DIAGNOSIS — Z09 Encounter for follow-up examination after completed treatment for conditions other than malignant neoplasm: Secondary | ICD-10-CM

## 2014-05-18 NOTE — Progress Notes (Signed)
Subjective:     Patient ID: Jessica Wells, female   DOB: 1946-03-20, 68 y.o.   MRN: [id number]  HPI 66 yof s/p left total prophylactic mastectomy who is doing well without complaint today. Drains putting out over 30 cc per day still. Some pain at her axilla still.  Path was benign.  Review of Systems     Objective:   Physical Exam Wound healing well without infection, drains serous    Assessment:     S/p left mastectomy     Plan:          She is doing well, will keep both drains. She will call Monday with drain outputs and I will see back in 2 weeks.

## 2014-05-19 ENCOUNTER — Ambulatory Visit: Payer: Medicare Other | Admitting: Physical Therapy

## 2014-05-19 DIAGNOSIS — IMO0001 Reserved for inherently not codable concepts without codable children: Secondary | ICD-10-CM | POA: Diagnosis not present

## 2014-05-19 NOTE — Addendum Note (Signed)
Addendum created 05/19/14 2044 by Laurie Panda, MD   Modules edited: Anesthesia Blocks and Procedures, Clinical Notes   Clinical Notes:  File: [id number]

## 2014-05-22 ENCOUNTER — Ambulatory Visit (INDEPENDENT_AMBULATORY_CARE_PROVIDER_SITE_OTHER): Payer: Medicare Other

## 2014-05-22 ENCOUNTER — Ambulatory Visit: Payer: Medicare Other | Admitting: Physical Therapy

## 2014-05-22 DIAGNOSIS — C50919 Malignant neoplasm of unspecified site of unspecified female breast: Secondary | ICD-10-CM

## 2014-05-22 DIAGNOSIS — IMO0001 Reserved for inherently not codable concepts without codable children: Secondary | ICD-10-CM | POA: Diagnosis not present

## 2014-05-22 DIAGNOSIS — C50912 Malignant neoplasm of unspecified site of left female breast: Secondary | ICD-10-CM

## 2014-05-22 NOTE — Progress Notes (Signed)
Pt came in for nurse only to have one of her drains removed. Pt s/p left mastectomy. Pt reported that drain #1 was putting out less than 10cc's in two days and drain #2 is putting out 30cc's still. The pt will check back in with me later this week once the drainage gets less. I opened the pink binder on the pt and unclipped the drains from the binder. I placed the drain #2 on the other side of the pt so I could clip the stitch for the drain #1. I removed the drain #1 with no problem. I applied triple antibiotic ointment on a 4x4 gauze on the opened drain site. I redressed the drain #2 site with 4x4 gauze and triple antibiotic ointment b/c the area was getting aggravated from the stitch rubbing on the skin. I pulled the pink binder back together. I advised pt that she could wash the pink binder just don't dry it in the dryer. The pt understands and will call later.

## 2014-05-24 ENCOUNTER — Telehealth: Payer: Self-pay | Admitting: Adult Health

## 2014-05-24 ENCOUNTER — Ambulatory Visit (HOSPITAL_BASED_OUTPATIENT_CLINIC_OR_DEPARTMENT_OTHER): Payer: Medicare Other | Admitting: Adult Health

## 2014-05-24 ENCOUNTER — Encounter: Payer: Self-pay | Admitting: Adult Health

## 2014-05-24 ENCOUNTER — Other Ambulatory Visit (HOSPITAL_BASED_OUTPATIENT_CLINIC_OR_DEPARTMENT_OTHER): Payer: Medicare Other

## 2014-05-24 ENCOUNTER — Ambulatory Visit: Payer: Medicare Other | Admitting: Physical Therapy

## 2014-05-24 VITALS — BP 136/82 | HR 76 | Temp 98.4°F | Resp 20 | Ht 61.0 in | Wt 145.4 lb

## 2014-05-24 DIAGNOSIS — Z86718 Personal history of other venous thrombosis and embolism: Secondary | ICD-10-CM

## 2014-05-24 DIAGNOSIS — C50919 Malignant neoplasm of unspecified site of unspecified female breast: Secondary | ICD-10-CM | POA: Diagnosis not present

## 2014-05-24 DIAGNOSIS — I890 Lymphedema, not elsewhere classified: Secondary | ICD-10-CM

## 2014-05-24 DIAGNOSIS — I82403 Acute embolism and thrombosis of unspecified deep veins of lower extremity, bilateral: Secondary | ICD-10-CM

## 2014-05-24 DIAGNOSIS — IMO0001 Reserved for inherently not codable concepts without codable children: Secondary | ICD-10-CM | POA: Diagnosis not present

## 2014-05-24 DIAGNOSIS — C50111 Malignant neoplasm of central portion of right female breast: Secondary | ICD-10-CM

## 2014-05-24 LAB — PROTIME-INR
INR: 1.3 — ABNORMAL LOW (ref 2.00–3.50)
PROTIME: 15.6 s — AB (ref 10.6–13.4)

## 2014-05-24 NOTE — Progress Notes (Signed)
Hematology and Oncology Follow Up Visit  Jessica Wells [id number] [birth year] 68 y.o. 05/25/2014 8:37 AM     Principle Diagnosis:Jessica Wells 68 y.o. female with inflammatory breast cancer.     Prior Therapy:  1. The patient was originally seen in clinic for new diagnosis of inflammatory breast cancer, she was referred by Dr. Rolm Bookbinder. She had a mammogram performed on 07/14/2012 that showed an abnormality. That was biopsied and it showed an invasive mammary carcinoma with lymphovascular invasion grade 3 ER negative PR negative HER-2/neu positive.   2. The patient had a MRI of the breasts performed on 07/19/2012. The MRI showed diffuse right breast neoplasm on a large level I right axillary lymph nodes compatible with lymphatic spread. The patient has had a biopsy of the right axillary lymph node that is compatible with invasive mammary carcinoma.   3. The patient began neoadjuvant FEC 100 with day 2 Neulasta support on 07/23/2012.   4. Weekly neoadjuvant Taxol and Herceptin started on 09/17/12. The patient developed neuropathies, and Taxol was discontinued early. She completed 7 weeks of Taxol/Herceptin combination therapy. The patient then received 2 weeks of Herceptin only.   5. Weekly Gemcitabine and Herceptin was started on 11/19/12 and completed on 12/17/2012 after 5 cycles.   6. A regimen of Lasix 20 mg PO and Kdur 10 mEq was started on 01/07/2013 due to chronic lower extremity edema. Medications are only to be taken if patient has a weight gain of 5 lbs or more in a day (daily weights).   7. Anemia was treated with blood transfusions of 1 unit of PRBCs given on 01/11/2013 and another unit of PRBC's given 01/12/2013.   8. Patient underwent right mastectomy on 02/08/13. Pathology showed a complete response to chemotherapy in the breast and 11 nodes identified with fibrosis but no carcinoma.   9. Radiation therapy from 03/31/13 through 05/17/13.   10. S/P Every 3 week herceptin from  02/25/13 through 10/14/13.   Current therapy:  Observation  Interim History: Jessica Wells 68 y.o. female returns today for follow up today since having surgery to have her left breast removed and her port removed.  She was started on Lovenox 169m daily after her surgery, then she saw Dr. WDonne Hazellast week and was started on Coumadin 7.519mdaily.  She is doing well.  She has no easy bleeding.  She still has her JP drains.  She had one removed this past Monday, and the drainage in her second drain is decreasing, and she is hoping to possibly have it removed on Friday.  She is otherwise doing well and her mastectomy site is doing well also.  Her right arm lymphedema is improving also, and she will be getting a new glove soon.  Otherwise, she is doing well and a 10 point ROS is neg.   Medications:  Current Outpatient Prescriptions  Medication Sig Dispense Refill  . B Complex-C (SUPER B COMPLEX PO) Take 1 tablet by mouth daily.      . calcium-vitamin D (OSCAL WITH D) 500-200 MG-UNIT per tablet Take 1 tablet by mouth every other day.       . enoxaparin (LOVENOX) 100 MG/ML injection Inject 1 mL (100 mg total) into the skin daily.  30 Syringe  1  . Ferrous Sulfate (SLOW FE PO) Take 1 tablet by mouth every other day.      . fish oil-omega-3 fatty acids 1000 MG capsule Take 1 g by mouth daily.       .Marland Kitchen  Flaxseed, Linseed, (FLAX SEEDS PO) Take 1 tablet by mouth daily.      Marland Kitchen glucosamine-chondroitin 500-400 MG tablet Take 1 tablet by mouth daily.      . metoprolol succinate (TOPROL-XL) 50 MG 24 hr tablet Take 1 tablet (50 mg total) by mouth daily before breakfast. Take with or immediately following a meal.  30 tablet  5  . montelukast (SINGULAIR) 10 MG tablet Take 10 mg by mouth at bedtime.      . Multiple Vitamin (MULTIVITAMIN WITH MINERALS) TABS Take 1 tablet by mouth every other day.       Marland Kitchen omeprazole (PRILOSEC) 20 MG capsule Take 20 mg by mouth daily. For heartburn      . PROVENTIL HFA 108 (90 BASE)  MCG/ACT inhaler Inhale 2 puffs into the lungs every 4 (four) hours as needed.       . vitamin C (ASCORBIC ACID) 500 MG tablet Take 500 mg by mouth daily.      . vitamin E 1000 UNIT capsule Take 1,000 Units by mouth daily.       No current facility-administered medications for this visit.     Allergies:  Allergies  Allergen Reactions  . Anesthetics, Amide Nausea And Vomiting    Projectile vomitting-Nausea- with 24hrs of dry heaves. With any anesthetics  . Bactrim [Sulfamethoxazole-Trimethoprim] Other (See Comments)    Massive diarrhea, nausea vomiting, platelets dropped, hemoglobin dropped, admitted to hospital  . Sulfa Antibiotics Nausea And Vomiting and Other (See Comments)    Crazy feeling in head Life threatening reaction, decreased blood counts  . Codeine Nausea And Vomiting  . Codeine Phosphate Nausea And Vomiting  . Eggs Or Egg-Derived Products Hives    Medical History: Past Medical History  Diagnosis Date  . GERD (gastroesophageal reflux disease)   . Allergy   . PAT (paroxysmal atrial tachycardia)     hx  . PONV (postoperative nausea and vomiting) 09-13-12    severe, with Port-a-cath, was managed without PONV  . Clotting disorder     prothrombin gene mutation-heterozygous   . History of chemotherapy     Last dose to be 02-04-13.  Was rx'd with Herceptin & Gemzar  . DVT (deep venous thrombosis) 2014    BLE  . Asthma     Uses Inhalers Proventil as needed  . Bursitis     "both shoulders"  . History of blood transfusion 01-25-13    2 units 01-11-13 ("after chem")  . S/P radiation therapy 4-12 wks ago 03/31/13-05/17/13    right breast/supraclavicular fossa/posterior axillary boost  . Breast cancer 07/14/12    inflammatory right breast ca, ER/PR -  . H/O hiatal hernia   . Dysrhythmia     PAT-sees dr Lina Sayre meds  . Pneumonia 01-25-13    hx. 12-27-12-hospital stay x 9 days, now resolved.  . Pneumonia     "episodic; all my life" (05/10/2014)  . Anemia     "after chemo"   . Arthritis     "knees; fingers; occasionally" (05/10/2014)  . Osteopenia     "lower spine only" (05/10/2014)  . Lymphedema     RUE    Surgical History:  Past Surgical History  Procedure Laterality Date  . Dilation and curettage of uterus  1998 X 3  . Tonsillectomy    . Colonoscopy    . Portacath placement  07/21/2012    Procedure: INSERTION PORT-A-CATH;  Surgeon: Rolm Bookbinder, MD;  Location: [facility];  Service: General;  Laterality: Left;/ Replacement done  10'13  . Insertion of vena cava filter  12/2012  . Tumor excision Right 1970    giant cell, off my forearm"  . Chalazion excision Right 1980's    right( MD office)  . Mastectomy modified radical Right 02/08/2013    Procedure: MASTECTOMY MODIFIED RADICAL;  Surgeon: Rolm Bookbinder, MD;  Location: [facility];  Service: General;  Laterality: Right;  . Port-a-cath removal  05/10/2014  . Mastectomy Left 05/10/2014    PROPHYLACTIC   . Mastectomy modified radical Right   . Tubal ligation  ~ 1974  . Breast biopsy Right 2013 X 3  . Simple mastectomy with axillary sentinel node biopsy Left 05/10/2014    Procedure: PROPHYLACTIC LEFT MASTECTOMY;  Surgeon: Rolm Bookbinder, MD;  Location: [facility];  Service: General;  Laterality: Left;  . Port-a-cath removal N/A 05/10/2014    Procedure: REMOVAL PORT-A-CATH;  Surgeon: Rolm Bookbinder, MD;  Location: [place];  Service: General;  Laterality: N/A;     Review of Systems: A 10 point review of systems was conducted and is otherwise negative except for what is noted above.     Physical Exam: Blood pressure 136/82, pulse 76, temperature 98.4 F (36.9 C), temperature source Oral, resp. rate 20, height 5' 1"  (1.549 m), weight 145 lb 6.4 oz (65.953 kg). GENERAL: Patient is a well appearing female in no acute distress HEENT:  Sclerae anicteric.  Oropharynx clear and moist. No ulcerations or evidence of oropharyngeal candidiasis. Neck is supple.  NODES:  No cervical, supraclavicular,  or axillary lymphadenopathy palpated.  BREAST EXAM:  S/p bilateral mastectomy.  Left mastectomy is covered with steri-strips, healing well, no drainage, or erythema present, one JP drain in place with minimal drainage in bulb.  LUNGS:  Clear to auscultation bilaterally.  No wheezes or rhonchi. HEART:  Regular rate and rhythm. No murmur appreciated. ABDOMEN:  Soft, nontender.  Positive, normoactive bowel sounds. No organomegaly palpated. MSK:  No focal spinal tenderness to palpation. Full range of motion bilaterally in the upper extremities. EXTREMITIES:  No peripheral edema.  Right arm edema SKIN:  Clear with no obvious rashes or skin changes. No nail dyscrasia. NEURO:  Nonfocal. Well oriented.  Appropriate affect. ECOG PERFORMANCE STATUS: 1 - Symptomatic but completely ambulatory   Lab Results: Lab Results  Component Value Date   WBC 6.0 05/12/2014   HGB 10.5* 05/12/2014   HCT 31.7* 05/12/2014   MCV 89.8 05/12/2014   PLT 163 05/12/2014     Chemistry      Component Value Date/Time   NA 141 05/11/2014 0438   NA 141 05/08/2014 1019   K 4.1 05/11/2014 0438   K 4.0 05/08/2014 1019   CL 105 05/11/2014 0438   CL 105 05/20/2013 1459   CO2 27 05/11/2014 0438   CO2 28 05/08/2014 1019   BUN 16 05/11/2014 0438   BUN 18.9 05/08/2014 1019   CREATININE 0.84 05/11/2014 0438   CREATININE 0.9 05/08/2014 1019      Component Value Date/Time   CALCIUM 8.6 05/11/2014 0438   CALCIUM 9.4 05/08/2014 1019   ALKPHOS 66 05/08/2014 1019   ALKPHOS 66 01/28/2013 1630   AST 17 05/08/2014 1019   AST 36 01/28/2013 1630   ALT 21 05/08/2014 1019   ALT 55* 01/28/2013 1630   BILITOT 0.62 05/08/2014 1019   BILITOT 0.2* 01/28/2013 1630      Assessment and Plan: Jessica Wells 68 y.o. female with  1. Inflammatory HER-2/neu positive breast cancer.  Please see prior history above.  The  patient underwent neoadjuvant chemotherapy/Herceptin followed by a right mastectomy and axillary node dissection that demonstrated a complete pathologic  response to chemotherapy.  This was followed by adjuvant radiation therapy and she completed one calendar year of Herceptin.    2. Lymphedema:  Patient's lymphedema is improved.  She will have a new glove soon and will continue to follow up with the lymphedema clinic.  3.  DVT: Patient is taking Lovenox 129m daily.  She is also taking Coumadin 7.573mdaily that was started last Friday. Her INR is 1.3 today.  She will return on Monday for an INR check.  She would like to start Xeralto or Apixiban in the future once she recovers from surgery. We did talk about this briefly today.  She will continue on Coumadin 7.5 mg daily and Lovenox 100101maily.  She knows to call me if she develops any easy bruising or bleeding, or any further concerns.    She knows to call us [place] the interim for any questions or concerns.  We can certainly see her sooner if needed.  I spent 25 minutes counseling the patient face to face.  The total time spent in the appointment was 30 minutes.  LinMinette HeadlandP [phone]/2015 8:37 AM

## 2014-05-24 NOTE — Patient Instructions (Signed)
Warfarin: What You Need to Know Warfarin is an anticoagulant. Anticoagulants help prevent the formation of blood clots. They also help stop the growth of blood clots. Warfarin is sometimes referred to as a "blood thinner."  Normally, when body tissues are cut or damaged, the blood clots in order to prevent blood loss. Sometimes clots form inside your blood vessels and obstruct the flow of blood through your circulatory system (thrombosis). These clots may travel through your bloodstream and become lodged in smaller blood vessels in your brain, which can cause a stroke, or in your lungs (pulmonary embolism). WHO SHOULD USE WARFARIN? Warfarin is prescribed for people at risk of developing harmful blood clots:  People with surgically implanted mechanical heart valves, irregular heart rhythms called atrial fibrillation, and certain clotting disorders.  People who have developed harmful blood clotting in the past, including those who have had a stroke or a pulmonary embolism, or thrombosis in their legs (deep vein thrombosis [DVT]).  People with an existing blood clot, such as a pulmonary embolism. WARFARIN DOSING Warfarin tablets come in different strengths. Each tablet strength is a different color, with the amount of warfarin (in milligrams) clearly printed on the tablet. If the color of your tablet is different than usual when you receive a new prescription, report it immediately to your pharmacist or health care provider. WARFARIN MONITORING The goal of warfarin therapy is to lessen the clotting tendency of blood but not prevent clotting completely. Your health care provider will monitor the anticoagulation effect of warfarin closely and adjust your dose as needed. For your safety, blood tests called prothrombin time (PT) or international normalized ratio (INR) are used to measure the effects of warfarin. Both of these tests can be done with a finger stick or a blood draw. The longer it takes the  blood to clot, the higher the PT or INR. Your health care provider will inform you of your "target" PT or INR range. If, at any time, your PT or INR is above the target range, there is a risk of bleeding. If your PT or INR is below the target range, there is a risk of clotting. Whether you are started on warfarin while you are in the hospital or in your health care provider's office, you will need to have your PT or INR checked within one week of starting the medicine. Initially, some people are asked to have their PT or INR checked as much as twice a week. Once you are on a stable maintenance dose, the PT or INR is checked less often, usually once every 2 to 4 weeks. The warfarin dose may be adjusted if the PT or INR is not within the target range. It is important to keep all laboratory and health care provider follow-up appointments. Not keeping appointments could result in a chronic or permanent injury, pain, or disability because warfarin is a medicine that requires close monitoring. WHAT ARE THE SIDE EFFECTS OF WARFARIN?  Too much warfarin can cause bleeding (hemorrhage) from any part of the body. This may include bleeding from the gums, blood in the urine, bloody or dark stools, a nosebleed that is not easily stopped, coughing up blood, or vomiting blood.  Too little warfarin can increase the risk of blood clots.  Too little or too much warfarin can also increase the risk of a stroke.  Warfarin use may cause a skin rash or irritation, an unusual fever, continual nausea or stomach upset, or severe pain in your joints or back.   SPECIAL PRECAUTIONS WHILE TAKING WARFARIN Warfarin should be taken exactly as directed. It is very important to take warfarin as directed since bleeding or blood clots could result in chronic or permanent injury, pain, or disability.  Take your medicine at the same time every day. If you forget to take your dose, you can take it if it is within 6 hours of when it was  due.  Do not change the dose of warfarin on your own to make up for missed or extra doses.  If you miss more than 2 doses in a row, you should contact your health care provider for advice. Avoid situations that cause bleeding. You may have a tendency to bleed more easily than usual while taking warfarin. The following actions can limit bleeding:  Using a softer toothbrush.  Flossing with waxed floss rather than unwaxed floss.  Shaving with an electric razor rather than a blade.  Limiting the use of sharp objects.  Avoiding potentially harmful activities, such as contact sports. Warfarin and Pregnancy or Breastfeeding  Warfarin is not advised during the first trimester of pregnancy due to an increased risk of birth defects. In certain situations, a woman may take warfarin after her first trimester of pregnancy. A woman who becomes pregnant or plans to become pregnant while taking warfarin should notify her health care provider immediately.  Although warfarin does not pass into breast milk, a woman who wishes to breastfeed while taking warfarin should also consult with her health care provider. Alcohol, Smoking, and Illicit Drug Use  Alcohol affects how warfarin works in the body. It is best to avoid alcoholic drinks or consume very small amounts while taking warfarin. In general, alcohol intake should be limited to 1 oz (30 mL) of liquor, 6 oz (180 mL) of wine, or 12 oz (360 mL) of beer each day. Notify your health care provider if you change your alcohol intake.  Smoking affects how warfarin works. It is best to avoid smoking while taking warfarin. Notify your health care provider if you change your smoking habits.  It is best to avoid all illicit drugs while taking warfarin since there are few studies that show how warfarin interacts with these drugs. Other Medicines and Dietary Supplements Many prescription and over-the-counter medicines can interfere with warfarin. Be sure all of your  health care providers know you are taking warfarin. Notify your health care provider who prescribed warfarin for you or your pharmacist before starting or stopping any new medicines, including over-the-counter vitamins, dietary supplements, and pain medicines. Your warfarin dose may need to be adjusted. Some common over-the-counter medicines that may increase the risk of bleeding while taking warfarin include:   Acetaminophen.  Aspirin.  Nonsteroidal anti-inflammatory medicines (NSAIDs), such as ibuprofen or naproxen.  Vitamin E. Dietary Considerations  Foods that have moderate or high amounts of vitamin K can interfere with warfarin. Avoid major changes in your diet or notify your health care provider before changing your diet. Eat a consistent amount of foods that have moderate or high amounts of vitamin K. Eating less foods containing vitamin K can increase the risk of bleeding. Eating more foods containing vitamin K can increase the risk of blood clots. Additional questions about dietary considerations can be discussed with a dietitian. Foods that are very high in vitamin K:  Greens, such as Swiss chard and beet, collard, mustard, or turnip greens (fresh or frozen, cooked).  Kale (fresh or frozen, cooked).  Parsley (raw).  Spinach (cooked). Foods that are high   in vitamin K:  Asparagus (frozen, cooked).  Beans, green (frozen, cooked).  Broccoli.  Bok choy (cooked).  Brussels sprouts (fresh or frozen, cooked).  Cabbage (cooked).   Coleslaw. Foods that are moderately high in vitamin K:  Blueberries.  Black-eyed peas.  Endive (raw).  Green leaf lettuce (raw).  Green scallions (raw).  Kale (raw).  Okra (frozen, cooked).  Plantains (fried).  Romaine lettuce (raw).  Sauerkraut (canned).  Spinach (raw). CALL YOUR CLINIC OR HEALTH CARE PROVIDER IF YOU:  Plan to have any surgery or procedure.  Feel sick, especially if you have diarrhea or  vomiting.  Experience or anticipate any major changes in your diet.  Start or stop a prescription or over-the-counter medicine.  Become, plan to become, or think you may be pregnant.  Are having heavier than usual menstrual periods.  Have had a fall, accident, or any symptoms of bleeding or unusual bruising.  Develop an unusual fever. CALL 911 IN THE [place] OR GO TO THE EMERGENCY DEPARTMENT IF YOU:   Think you may be having an allergic reaction to warfarin. The signs of an allergic reaction could include itching, rash, hives, swelling, chest tightness, or trouble breathing.  See signs of blood in your urine. The signs could include reddish, pinkish, or tea-colored urine.  See signs of blood in your stools. The signs could include bright red or black stools.  Vomit or cough up blood. In these instances, the blood could have either a bright red or a "coffee-grounds" appearance.  Have bleeding that will not stop after applying pressure for 30 minutes such as cuts, nosebleeds, or other injuries.  Have severe pain in your joints or back.  Have a new and severe headache.  Have sudden weakness or numbness of your face, arm, or leg, especially on one side of your body.  Have sudden confusion or trouble understanding.  Have sudden trouble seeing in one or both eyes.  Have sudden trouble walking, dizziness, loss of balance, or coordination.  Have trouble speaking or understanding (aphasia). Document Released: 11/17/2005 Document Revised: 11/22/2013 Document Reviewed: 05/13/2013 [facility] Patient Information 2015 [place], [place]. This information is not intended to replace advice given to you by your health care provider. Make sure you discuss any questions you have with your health care provider.

## 2014-05-24 NOTE — Telephone Encounter (Signed)
, °

## 2014-05-26 ENCOUNTER — Ambulatory Visit (INDEPENDENT_AMBULATORY_CARE_PROVIDER_SITE_OTHER): Payer: Medicare Other | Admitting: *Deleted

## 2014-05-26 ENCOUNTER — Ambulatory Visit: Payer: Medicare Other | Admitting: Physical Therapy

## 2014-05-26 DIAGNOSIS — IMO0001 Reserved for inherently not codable concepts without codable children: Secondary | ICD-10-CM | POA: Diagnosis not present

## 2014-05-26 DIAGNOSIS — Z4803 Encounter for change or removal of drains: Secondary | ICD-10-CM

## 2014-05-26 NOTE — Progress Notes (Signed)
Pt came in for nurse only to have her last drain removed. Pt s/p left mastectomy.  Pt reported that drain was putting out 20cc's for two days.  I removed the bandage off the drain site.  I clipped the suture and removed the drain with no problem.  I applied triple antibiotic ointment on a 4x4 gauze on the opened drain site.  I advised the pt to keep her future appts.  Pt was appreciative and verbalized understanding.

## 2014-05-29 ENCOUNTER — Telehealth: Payer: Self-pay | Admitting: Adult Health

## 2014-05-29 ENCOUNTER — Other Ambulatory Visit (HOSPITAL_BASED_OUTPATIENT_CLINIC_OR_DEPARTMENT_OTHER): Payer: Medicare Other

## 2014-05-29 DIAGNOSIS — I82403 Acute embolism and thrombosis of unspecified deep veins of lower extremity, bilateral: Secondary | ICD-10-CM

## 2014-05-29 DIAGNOSIS — I82409 Acute embolism and thrombosis of unspecified deep veins of unspecified lower extremity: Secondary | ICD-10-CM

## 2014-05-29 LAB — PROTIME-INR
INR: 1.9 — ABNORMAL LOW (ref 2.00–3.50)
PROTIME: 22.8 s — AB (ref 10.6–13.4)

## 2014-05-29 NOTE — Telephone Encounter (Signed)
Called patient about INR results.  Her INR is 1.9.  She has no easy bruising, bleeding or any further concerns.  She is currently taking Lovenox 1.5mg /kg/day and Coumadin 7.5 mg QHS.  I recommended today that she stop Lovenox, and continue on Coumadin 7.5 mg daily.  We will re-check her INR in one week.  Patient verbalized understanding.  Minette Headland, [place] [phone]

## 2014-05-30 ENCOUNTER — Ambulatory Visit: Payer: Medicare Other | Admitting: Physical Therapy

## 2014-05-30 ENCOUNTER — Telehealth: Payer: Self-pay | Admitting: Adult Health

## 2014-05-30 DIAGNOSIS — IMO0001 Reserved for inherently not codable concepts without codable children: Secondary | ICD-10-CM | POA: Diagnosis not present

## 2014-05-31 ENCOUNTER — Telehealth (INDEPENDENT_AMBULATORY_CARE_PROVIDER_SITE_OTHER): Payer: Self-pay

## 2014-05-31 DIAGNOSIS — C50911 Malignant neoplasm of unspecified site of right female breast: Secondary | ICD-10-CM

## 2014-05-31 NOTE — Telephone Encounter (Signed)
PT order placed in epic.

## 2014-06-05 ENCOUNTER — Ambulatory Visit: Payer: Medicare Other | Attending: General Surgery | Admitting: Physical Therapy

## 2014-06-05 ENCOUNTER — Other Ambulatory Visit: Payer: Medicare Other

## 2014-06-05 ENCOUNTER — Telehealth: Payer: Self-pay

## 2014-06-05 DIAGNOSIS — M24519 Contracture, unspecified shoulder: Secondary | ICD-10-CM | POA: Insufficient documentation

## 2014-06-05 DIAGNOSIS — C50919 Malignant neoplasm of unspecified site of unspecified female breast: Secondary | ICD-10-CM | POA: Diagnosis not present

## 2014-06-05 DIAGNOSIS — I890 Lymphedema, not elsewhere classified: Secondary | ICD-10-CM | POA: Diagnosis not present

## 2014-06-05 DIAGNOSIS — IMO0001 Reserved for inherently not codable concepts without codable children: Secondary | ICD-10-CM | POA: Diagnosis not present

## 2014-06-05 NOTE — Telephone Encounter (Signed)
Faxed assessment and treatment plan to [facility].  Sent to scan.

## 2014-06-07 ENCOUNTER — Other Ambulatory Visit (HOSPITAL_BASED_OUTPATIENT_CLINIC_OR_DEPARTMENT_OTHER): Payer: Medicare Other

## 2014-06-07 ENCOUNTER — Telehealth: Payer: Self-pay | Admitting: Adult Health

## 2014-06-07 ENCOUNTER — Ambulatory Visit (HOSPITAL_BASED_OUTPATIENT_CLINIC_OR_DEPARTMENT_OTHER): Payer: Medicare Other | Admitting: Adult Health

## 2014-06-07 VITALS — BP 115/70 | HR 96 | Temp 97.5°F | Resp 18 | Ht 61.0 in | Wt 146.7 lb

## 2014-06-07 DIAGNOSIS — Z7901 Long term (current) use of anticoagulants: Secondary | ICD-10-CM | POA: Diagnosis not present

## 2014-06-07 DIAGNOSIS — C50919 Malignant neoplasm of unspecified site of unspecified female breast: Secondary | ICD-10-CM

## 2014-06-07 DIAGNOSIS — I82403 Acute embolism and thrombosis of unspecified deep veins of lower extremity, bilateral: Secondary | ICD-10-CM

## 2014-06-07 DIAGNOSIS — I890 Lymphedema, not elsewhere classified: Secondary | ICD-10-CM | POA: Diagnosis not present

## 2014-06-07 DIAGNOSIS — C50111 Malignant neoplasm of central portion of right female breast: Secondary | ICD-10-CM

## 2014-06-07 DIAGNOSIS — I82409 Acute embolism and thrombosis of unspecified deep veins of unspecified lower extremity: Secondary | ICD-10-CM | POA: Diagnosis not present

## 2014-06-07 LAB — PROTIME-INR
INR: 2.9 (ref 2.00–3.50)
Protime: 34.8 Seconds — ABNORMAL HIGH (ref 10.6–13.4)

## 2014-06-07 NOTE — Progress Notes (Signed)
Hematology and Oncology Follow Up Visit  Jessica Wells [id number] 24-Dec-1945 68 y.o. 06/09/2014 12:56 PM     Principle Diagnosis:Jessica Wells 68 y.o. female with inflammatory breast cancer.     Prior Therapy:  1. The patient was originally seen in clinic for new diagnosis of inflammatory breast cancer, she was referred by Dr. Rolm Bookbinder. She had a mammogram performed on 07/14/2012 that showed an abnormality. That was biopsied and it showed an invasive mammary carcinoma with lymphovascular invasion grade 3 ER negative PR negative HER-2/neu positive.   2. The patient had a MRI of the breasts performed on 07/19/2012. The MRI showed diffuse right breast neoplasm on a large level I right axillary lymph nodes compatible with lymphatic spread. The patient has had a biopsy of the right axillary lymph node that is compatible with invasive mammary carcinoma.   3. The patient began neoadjuvant FEC 100 with day 2 Neulasta support on 07/23/2012.   4. Weekly neoadjuvant Taxol and Herceptin started on 09/17/12. The patient developed neuropathies, and Taxol was discontinued early. She completed 7 weeks of Taxol/Herceptin combination therapy. The patient then received 2 weeks of Herceptin only.   5. Weekly Gemcitabine and Herceptin was started on 11/19/12 and completed on 12/17/2012 after 5 cycles.   6. A regimen of Lasix 20 mg PO and Kdur 10 mEq was started on 01/07/2013 due to chronic lower extremity edema. Medications are only to be taken if patient has a weight gain of 5 lbs or more in a day (daily weights).   7. Anemia was treated with blood transfusions of 1 unit of PRBCs given on 01/11/2013 and another unit of PRBC's given 01/12/2013.   8. Patient underwent right mastectomy on 02/08/13. Pathology showed a complete response to chemotherapy in the breast and 11 nodes identified with fibrosis but no carcinoma.   9. Radiation therapy from 03/31/13 through 05/17/13.   10. S/P Every 3 week herceptin  from 02/25/13 through 10/14/13.   Current therapy:  Observation  Interim History: Jessica Wells 68 y.o. female returns today for follow up.  She is doing well today.  She has started back with physical therapy.  She was released by Dr. Donne Hazel to begin exercising in her left arm.  She is doing well with this.  She is taking coumadin 7.3m daily.  Her INR is 2.9 today.  She has no easy bruising or bleeding.  She otherwise is doing well and a 10 point ROS is negative.    Medications:  Current Outpatient Prescriptions  Medication Sig Dispense Refill  . B Complex-C (SUPER B COMPLEX PO) Take 1 tablet by mouth daily.      . calcium-vitamin D (OSCAL WITH D) 500-200 MG-UNIT per tablet Take 1 tablet by mouth every other day.       . Ferrous Sulfate (SLOW FE PO) Take 1 tablet by mouth every other day.      . fish oil-omega-3 fatty acids 1000 MG capsule Take 1 g by mouth daily.       . Flaxseed, Linseed, (FLAX SEEDS PO) Take 1 tablet by mouth daily.      .Marland Kitchenglucosamine-chondroitin 500-400 MG tablet Take 1 tablet by mouth daily.      . metoprolol succinate (TOPROL-XL) 50 MG 24 hr tablet Take 1 tablet (50 mg total) by mouth daily before breakfast. Take with or immediately following a meal.  30 tablet  5  . montelukast (SINGULAIR) 10 MG tablet Take 10 mg by mouth at bedtime.      .Marland Kitchen  Multiple Vitamin (MULTIVITAMIN WITH MINERALS) TABS Take 1 tablet by mouth every other day.       Marland Kitchen omeprazole (PRILOSEC) 20 MG capsule Take 20 mg by mouth daily. For heartburn      . vitamin C (ASCORBIC ACID) 500 MG tablet Take 500 mg by mouth daily.      . vitamin E 1000 UNIT capsule Take 1,000 Units by mouth daily.      Marland Kitchen PROVENTIL HFA 108 (90 BASE) MCG/ACT inhaler Inhale 2 puffs into the lungs every 4 (four) hours as needed.        No current facility-administered medications for this visit.     Allergies:  Allergies  Allergen Reactions  . Anesthetics, Amide Nausea And Vomiting    Projectile vomitting-Nausea- with 24hrs  of dry heaves. With any anesthetics  . Bactrim [Sulfamethoxazole-Trimethoprim] Other (See Comments)    Massive diarrhea, nausea vomiting, platelets dropped, hemoglobin dropped, admitted to hospital  . Sulfa Antibiotics Nausea And Vomiting and Other (See Comments)    Crazy feeling in head Life threatening reaction, decreased blood counts  . Codeine Nausea And Vomiting  . Codeine Phosphate Nausea And Vomiting  . Eggs Or Egg-Derived Products Hives    Medical History: Past Medical History  Diagnosis Date  . GERD (gastroesophageal reflux disease)   . Allergy   . PAT (paroxysmal atrial tachycardia)     hx  . PONV (postoperative nausea and vomiting) 09-13-12    severe, with Port-a-cath, was managed without PONV  . Clotting disorder     prothrombin gene mutation-heterozygous   . History of chemotherapy     Last dose to be 02-04-13.  Was rx'd with Herceptin & Gemzar  . DVT (deep venous thrombosis) 2014    BLE  . Asthma     Uses Inhalers Proventil as needed  . Bursitis     "both shoulders"  . History of blood transfusion 01-25-13    2 units 01-11-13 ("after chem")  . S/P radiation therapy 4-12 wks ago 03/31/13-05/17/13    right breast/supraclavicular fossa/posterior axillary boost  . Breast cancer 07/14/12    inflammatory right breast ca, ER/PR -  . H/O hiatal hernia   . Dysrhythmia     PAT-sees dr Lina Sayre meds  . Pneumonia 01-25-13    hx. 12-27-12-hospital stay x 9 days, now resolved.  . Pneumonia     "episodic; all my life" (05/10/2014)  . Anemia     "after chemo"  . Arthritis     "knees; fingers; occasionally" (05/10/2014)  . Osteopenia     "lower spine only" (05/10/2014)  . Lymphedema     RUE    Surgical History:  Past Surgical History  Procedure Laterality Date  . Dilation and curettage of uterus  1998 X 3  . Tonsillectomy    . Colonoscopy    . Portacath placement  07/21/2012    Procedure: INSERTION PORT-A-CATH;  Surgeon: Rolm Bookbinder, MD;  Location: [facility];  Service: General;  Laterality: Left;/ Replacement done 10'13  . Insertion of vena cava filter  12/2012  . Tumor excision Right 1970    giant cell, off my forearm"  . Chalazion excision Right 1980's    right( MD office)  . Mastectomy modified radical Right 02/08/2013    Procedure: MASTECTOMY MODIFIED RADICAL;  Surgeon: Rolm Bookbinder, MD;  Location: [facility];  Service: General;  Laterality: Right;  . Port-a-cath removal  05/10/2014  . Mastectomy Left 05/10/2014    PROPHYLACTIC   . Mastectomy  modified radical Right   . Tubal ligation  ~ 1974  . Breast biopsy Right 2013 X 3  . Simple mastectomy with axillary sentinel node biopsy Left 05/10/2014    Procedure: PROPHYLACTIC LEFT MASTECTOMY;  Surgeon: Rolm Bookbinder, MD;  Location: [facility];  Service: General;  Laterality: Left;  . Port-a-cath removal N/A 05/10/2014    Procedure: REMOVAL PORT-A-CATH;  Surgeon: Rolm Bookbinder, MD;  Location: [facility];  Service: General;  Laterality: N/A;     Review of Systems: A 10 point review of systems was conducted and is otherwise negative except for what is noted above.     Physical Exam: Blood pressure 115/70, pulse 96, temperature 97.5 F (36.4 C), temperature source Oral, resp. rate 18, height 5' 1"  (1.549 m), weight 146 lb 11.2 oz (66.543 kg). GENERAL: Patient is a well appearing female in no acute distress HEENT:  Sclerae anicteric.  Oropharynx clear and moist. No ulcerations or evidence of oropharyngeal candidiasis. Neck is supple.  NODES:  No cervical, supraclavicular, or axillary lymphadenopathy palpated.  BREAST EXAM:  S/p bilateral mastectomy.  Left mastectomy is covered with steri-strips, healing well, no drainage, or erythema present LUNGS:  Clear to auscultation bilaterally.  No wheezes or rhonchi. HEART:  Regular rate and rhythm. No murmur appreciated. ABDOMEN:  Soft, nontender.  Positive, normoactive bowel sounds. No organomegaly palpated. MSK:  No focal spinal tenderness to  palpation. Full range of motion bilaterally in the upper extremities. EXTREMITIES:  No peripheral edema.  Right arm edema SKIN:  Clear with no obvious rashes or skin changes. No nail dyscrasia. NEURO:  Nonfocal. Well oriented.  Appropriate affect. ECOG PERFORMANCE STATUS: 1 - Symptomatic but completely ambulatory   Lab Results: Lab Results  Component Value Date   WBC 6.0 05/12/2014   HGB 10.5* 05/12/2014   HCT 31.7* 05/12/2014   MCV 89.8 05/12/2014   PLT 163 05/12/2014     Chemistry      Component Value Date/Time   NA 141 05/11/2014 0438   NA 141 05/08/2014 1019   K 4.1 05/11/2014 0438   K 4.0 05/08/2014 1019   CL 105 05/11/2014 0438   CL 105 05/20/2013 1459   CO2 27 05/11/2014 0438   CO2 28 05/08/2014 1019   BUN 16 05/11/2014 0438   BUN 18.9 05/08/2014 1019   CREATININE 0.84 05/11/2014 0438   CREATININE 0.9 05/08/2014 1019      Component Value Date/Time   CALCIUM 8.6 05/11/2014 0438   CALCIUM 9.4 05/08/2014 1019   ALKPHOS 66 05/08/2014 1019   ALKPHOS 66 01/28/2013 1630   AST 17 05/08/2014 1019   AST 36 01/28/2013 1630   ALT 21 05/08/2014 1019   ALT 55* 01/28/2013 1630   BILITOT 0.62 05/08/2014 1019   BILITOT 0.2* 01/28/2013 1630      Assessment and Plan: Jessica Wells 68 y.o. female with  1. Inflammatory HER-2/neu positive breast cancer.  Please see prior history above.  The patient underwent neoadjuvant chemotherapy/Herceptin followed by a right mastectomy and axillary node dissection that demonstrated a complete pathologic response to chemotherapy.  This was followed by adjuvant radiation therapy and she completed one calendar year of Herceptin.  She is doing well following her left mastectomy.    2. Lymphedema:  Patient's lymphedema is improved.  She is wearing her new glove and is now beginning to work with physical therapy on the range of motion of her shoulders.    3.  DVT: Patient is doing well today.  Her INR is  2.9.  She stopped taking Lovenox daily last week.  She is going to take Coumadin  7.66m five times per week and 555mon Tuesday and Friday.  She will return in 1.5 weeks for labs only and in one month for evaluation.  She knows to call [facility] the interim for any questions or concerns.  We can certainly see her sooner if needed.  I spent 25 minutes counseling the patient face to face.  The total time spent in the appointment was 30 minutes.  LiMinette [phone]/08/2014 12:56 PM

## 2014-06-07 NOTE — Telephone Encounter (Signed)
per pof to sch pt appt-[facility] pt copy of sch

## 2014-06-08 ENCOUNTER — Ambulatory Visit: Payer: Medicare Other

## 2014-06-08 DIAGNOSIS — IMO0001 Reserved for inherently not codable concepts without codable children: Secondary | ICD-10-CM | POA: Diagnosis not present

## 2014-06-09 ENCOUNTER — Encounter: Payer: Self-pay | Admitting: Adult Health

## 2014-06-12 ENCOUNTER — Encounter: Payer: Medicare Other | Admitting: Physical Therapy

## 2014-06-13 ENCOUNTER — Ambulatory Visit (INDEPENDENT_AMBULATORY_CARE_PROVIDER_SITE_OTHER): Payer: Medicare Other | Admitting: General Surgery

## 2014-06-13 ENCOUNTER — Encounter (INDEPENDENT_AMBULATORY_CARE_PROVIDER_SITE_OTHER): Payer: Self-pay | Admitting: General Surgery

## 2014-06-13 VITALS — BP 110/76 | HR 70 | Resp 16 | Ht 61.0 in | Wt 149.0 lb

## 2014-06-13 DIAGNOSIS — Z09 Encounter for follow-up examination after completed treatment for conditions other than malignant neoplasm: Secondary | ICD-10-CM

## 2014-06-13 MED ORDER — CEPHALEXIN 500 MG PO CAPS: 500.0000 mg | ORAL_CAPSULE | Freq: Two times a day (BID) | ORAL | Status: AC

## 2014-06-13 NOTE — Progress Notes (Signed)
Subjective:     Patient ID: Jessica Wells, female   DOB: [birth year], 68 y.o.   MRN: [id number]  HPI 65 yof s/p prophylactic left mastectomy.  Doing pretty well except for some arm issues she is working with pt and getting better. There is some swelling laterally also and some redness.  Otherwise doing well.  Review of Systems     Objective:   Physical Exam Incision healing well, laterally at upper portion there is some swelling and some erythema, mildly tender     Assessment:     S/p left mastectomy Right inflammatory breast cancer     Plan:     i gave her rx for bra post mastectomy as I think some compression on that area will help.  Also gave abx for the redness and I think will make better.  She is released to full activity. If swelling/redness not resolved she will return in a few weeks ow I will see her in February. She will see her new oncologist in October.

## 2014-06-14 ENCOUNTER — Ambulatory Visit: Payer: Medicare Other | Admitting: Physical Therapy

## 2014-06-14 DIAGNOSIS — IMO0001 Reserved for inherently not codable concepts without codable children: Secondary | ICD-10-CM | POA: Diagnosis not present

## 2014-06-15 ENCOUNTER — Ambulatory Visit: Payer: Medicare Other | Admitting: Physical Therapy

## 2014-06-15 DIAGNOSIS — IMO0001 Reserved for inherently not codable concepts without codable children: Secondary | ICD-10-CM | POA: Diagnosis not present

## 2014-06-19 ENCOUNTER — Ambulatory Visit: Payer: Medicare Other | Admitting: Physical Therapy

## 2014-06-19 ENCOUNTER — Other Ambulatory Visit (HOSPITAL_BASED_OUTPATIENT_CLINIC_OR_DEPARTMENT_OTHER): Payer: Medicare Other

## 2014-06-19 DIAGNOSIS — Z7901 Long term (current) use of anticoagulants: Secondary | ICD-10-CM | POA: Diagnosis not present

## 2014-06-19 DIAGNOSIS — I82403 Acute embolism and thrombosis of unspecified deep veins of lower extremity, bilateral: Secondary | ICD-10-CM

## 2014-06-19 DIAGNOSIS — IMO0001 Reserved for inherently not codable concepts without codable children: Secondary | ICD-10-CM | POA: Diagnosis not present

## 2014-06-19 DIAGNOSIS — I82409 Acute embolism and thrombosis of unspecified deep veins of unspecified lower extremity: Secondary | ICD-10-CM | POA: Diagnosis not present

## 2014-06-19 LAB — PROTIME-INR
INR: 3 (ref 2.00–3.50)
Protime: 36 Seconds — ABNORMAL HIGH (ref 10.6–13.4)

## 2014-06-22 ENCOUNTER — Ambulatory Visit: Payer: Medicare Other

## 2014-06-22 DIAGNOSIS — IMO0001 Reserved for inherently not codable concepts without codable children: Secondary | ICD-10-CM | POA: Diagnosis not present

## 2014-06-27 ENCOUNTER — Ambulatory Visit: Payer: Medicare Other | Admitting: Physical Therapy

## 2014-06-27 DIAGNOSIS — IMO0001 Reserved for inherently not codable concepts without codable children: Secondary | ICD-10-CM | POA: Diagnosis not present

## 2014-06-28 ENCOUNTER — Ambulatory Visit: Payer: Medicare Other | Admitting: Physical Therapy

## 2014-06-28 DIAGNOSIS — IMO0001 Reserved for inherently not codable concepts without codable children: Secondary | ICD-10-CM | POA: Diagnosis not present

## 2014-07-03 ENCOUNTER — Ambulatory Visit: Payer: Medicare Other | Attending: General Surgery | Admitting: Physical Therapy

## 2014-07-03 DIAGNOSIS — M24519 Contracture, unspecified shoulder: Secondary | ICD-10-CM | POA: Diagnosis not present

## 2014-07-03 DIAGNOSIS — I890 Lymphedema, not elsewhere classified: Secondary | ICD-10-CM | POA: Insufficient documentation

## 2014-07-03 DIAGNOSIS — IMO0001 Reserved for inherently not codable concepts without codable children: Secondary | ICD-10-CM | POA: Insufficient documentation

## 2014-07-03 DIAGNOSIS — C50919 Malignant neoplasm of unspecified site of unspecified female breast: Secondary | ICD-10-CM | POA: Insufficient documentation

## 2014-07-07 ENCOUNTER — Ambulatory Visit: Payer: Medicare Other | Admitting: Physical Therapy

## 2014-07-10 ENCOUNTER — Encounter: Payer: Self-pay | Admitting: *Deleted

## 2014-07-10 ENCOUNTER — Ambulatory Visit (HOSPITAL_BASED_OUTPATIENT_CLINIC_OR_DEPARTMENT_OTHER): Payer: Medicare Other | Admitting: Adult Health

## 2014-07-10 ENCOUNTER — Other Ambulatory Visit (HOSPITAL_BASED_OUTPATIENT_CLINIC_OR_DEPARTMENT_OTHER): Payer: Medicare Other

## 2014-07-10 ENCOUNTER — Encounter: Payer: Self-pay | Admitting: Adult Health

## 2014-07-10 ENCOUNTER — Telehealth: Payer: Self-pay | Admitting: Hematology and Oncology

## 2014-07-10 VITALS — BP 125/72 | HR 78 | Temp 98.4°F | Resp 20 | Ht 61.0 in | Wt 149.9 lb

## 2014-07-10 DIAGNOSIS — I890 Lymphedema, not elsewhere classified: Secondary | ICD-10-CM

## 2014-07-10 DIAGNOSIS — C50919 Malignant neoplasm of unspecified site of unspecified female breast: Secondary | ICD-10-CM

## 2014-07-10 DIAGNOSIS — I82409 Acute embolism and thrombosis of unspecified deep veins of unspecified lower extremity: Secondary | ICD-10-CM

## 2014-07-10 DIAGNOSIS — I82403 Acute embolism and thrombosis of unspecified deep veins of lower extremity, bilateral: Secondary | ICD-10-CM

## 2014-07-10 DIAGNOSIS — C50911 Malignant neoplasm of unspecified site of right female breast: Secondary | ICD-10-CM

## 2014-07-10 DIAGNOSIS — Z7901 Long term (current) use of anticoagulants: Secondary | ICD-10-CM | POA: Diagnosis not present

## 2014-07-10 LAB — PROTIME-INR
INR: 2.7 (ref 2.00–3.50)
PROTIME: 32.4 s — AB (ref 10.6–13.4)

## 2014-07-10 NOTE — Telephone Encounter (Signed)
[facility] pt appt schedule for nov. [facility] will call re ct appt - pt aware.

## 2014-07-10 NOTE — Patient Instructions (Signed)
You are doing well!!!  Stop taking your Coumadin and start the apixaban 2 days later.  Take 5mg  twice a day.    Apixaban oral tablets What is this medicine? APIXABAN (a PIX a ban) is an anticoagulant (blood thinner). It is used to lower the chance of stroke in people with a medical condition called atrial fibrillation. It is also used to treat or prevent blood clots in the lungs or in the veins. This medicine may be used for other purposes; ask your health care provider or pharmacist if you have questions. COMMON BRAND NAME(S): Eliquis What should I tell my health care provider before I take this medicine? They need to know if you have any of these conditions: -bleeding disorders -bleeding in the brain -blood in your stools (black or tarry stools) or if you have blood in your vomit -history of stomach bleeding -kidney disease -liver disease -mechanical heart valve -an unusual or allergic reaction to apixaban, other medicines, foods, dyes, or preservatives -pregnant or trying to get pregnant -breast-feeding How should I use this medicine? Take this medicine by mouth with a glass of water. Follow the directions on the prescription label. You can take it with or without food. If it upsets your stomach, take it with food. Take your medicine at regular intervals. Do not take it more often than directed. Do not stop taking except on your doctor's advice. Stopping this medicine may increase your risk of a blot clot. Be sure to refill your prescription before you run out of medicine. Talk to your pediatrician regarding the use of this medicine in children. Special care may be needed. Overdosage: If you think you have taken too much of this medicine contact a poison control center or emergency room at once. NOTE: This medicine is only for you. Do not share this medicine with others. What if I miss a dose? If you miss a dose, take it as soon as you can. If it is almost time for your next dose, take  only that dose. Do not take double or extra doses. What may interact with this medicine? This medicine may interact with the following: -aspirin and aspirin-like medicines -certain medicines for fungal infections like ketoconazole and itraconazole -certain medicines for seizures like carbamazepine and phenytoin -certain medicines that treat or prevent blood clots like warfarin, enoxaparin, and dalteparin -clarithromycin -NSAIDs, medicines for pain and inflammation, like ibuprofen or naproxen -rifampin -ritonavir -St. John's wort This list may not describe all possible interactions. Give your health care provider a list of all the medicines, herbs, non-prescription drugs, or dietary supplements you use. Also tell them if you smoke, drink alcohol, or use illegal drugs. Some items may interact with your medicine. What should I watch for while using this medicine? Notify your doctor or health care professional and seek emergency treatment if you develop breathing problems; changes in vision; chest pain; severe, sudden headache; pain, swelling, warmth in the leg; trouble speaking; sudden numbness or weakness of the face, arm, or leg. These can be signs that your condition has gotten worse. If you are going to have surgery, tell your doctor or health care professional that you are taking this medicine. Tell your health care professional that you use this medicine before you have a spinal or epidural procedure. Sometimes people who take this medicine have bleeding problems around the spine when they have a spinal or epidural procedure. This bleeding is very rare. If you have a spinal or epidural procedure while on this medicine,  call your health care professional immediately if you have back pain, numbness or tingling (especially in your legs and feet), muscle weakness, paralysis, or loss of bladder or bowel control. Avoid sports and activities that might cause injury while you are using this medicine.  Severe falls or injuries can cause unseen bleeding. Be careful when using sharp tools or knives. Consider using an Copy. Take special care brushing or flossing your teeth. Report any injuries, bruising, or red spots on the skin to your doctor or health care professional. What side effects may I notice from receiving this medicine? Side effects that you should report to your doctor or health care professional as soon as possible: -allergic reactions like skin rash, itching or hives, swelling of the face, lips, or tongue -signs and symptoms of bleeding such as bloody or black, tarry stools; red or dark-brown urine; spitting up blood or brown material that looks like coffee grounds; red spots on the skin; unusual bruising or bleeding from the eye, gums, or nose This list may not describe all possible side effects. Call your doctor for medical advice about side effects. You may report side effects to FDA at 1-800-FDA-1088. Where should I keep my medicine? Keep out of the reach of children. Store at room temperature between 20 and 25 degrees C (68 and 77 degrees F). Throw away any unused medicine after the expiration date. NOTE: This sheet is a summary. It may not cover all possible information. If you have questions about this medicine, talk to your doctor, pharmacist, or health care provider.  2015, [employer] Standard. (2013-07-22 11:59:24)

## 2014-07-10 NOTE — Progress Notes (Addendum)
Hematology and Oncology Follow Up Visit  Jessica Wells [id number] 05-Dec-1945 68 y.o. 07/10/2014 1:40 PM     Principle Diagnosis:Jessica Wells 68 y.o. female with inflammatory breast cancer.     Prior Therapy:  1. The patient was originally seen in clinic for new diagnosis of inflammatory breast cancer, she was referred by Dr. Rolm Bookbinder. She had a mammogram performed on 07/14/2012 that showed an abnormality. That was biopsied and it showed an invasive mammary carcinoma with lymphovascular invasion grade 3 ER negative PR negative HER-2/neu positive.   2. The patient had a MRI of the breasts performed on 07/19/2012. The MRI showed diffuse right breast neoplasm on a large level I right axillary lymph nodes compatible with lymphatic spread. The patient has had a biopsy of the right axillary lymph node that is compatible with invasive mammary carcinoma.   3. The patient began neoadjuvant FEC 100 with day 2 Neulasta support on 07/23/2012.   4. Weekly neoadjuvant Taxol and Herceptin started on 09/17/12. The patient developed neuropathies, and Taxol was discontinued early. She completed 7 weeks of Taxol/Herceptin combination therapy. The patient then received 2 weeks of Herceptin only.   5. Weekly Gemcitabine and Herceptin was started on 11/19/12 and completed on 12/17/2012 after 5 cycles.   6. Patient was diagnosed with bilateral lower extremity DVTs in 12/2012, she was positive for prothrombin gene mutation, heterozygous, was placed on Lovenox, and an IVC filter was placed.  Shortly following her DVT diagnosis, she was emergently admitted to hospital with bilateral pulmonary infiltrates, bilateral pulmonary emboli, and acute respiratory failure.  She did have to be on a ventilator briefly, and has since recovered.  She did undergo bronchoscopy with BAL and cytology was concerning for adenocarcinoma.  She has underwent PET/CT and they have been negative.    7. Patient underwent right mastectomy on  02/08/13. Pathology showed a complete response to chemotherapy in the breast and 11 nodes identified with fibrosis but no carcinoma.   8. Radiation therapy from 03/31/13 through 05/17/13.   9. S/P Every 3 week herceptin from 02/25/13 through 10/14/13.   10.  Patient underwent left mastectomy on 05/10/14  Current therapy:  Observation  Interim History: Jessica Wells 68 y.o. female returns today for follow up.  She is doing well today.   She has been taking Coumadin as directed, 7.30m five times per week and 514mon Tuesday and Friday.  She continues to tolerate this well.  She is currently taking a one month vacation from physical therapy.  She denies any easy bruising/bleeding, bowel/bladder changes, weakness, new pain, headaches, vision changes, shortness of breath, cough, or any further concerns.  She would like to meet with Dr. GuLindi Adiend talk about changing to a different anticoagulant that doesn't require a lot of interval monitoring like the coumadin.  She would also like to know how requently shee needs to follow up from here on out.    Medications:  Current Outpatient Prescriptions  Medication Sig Dispense Refill  . B Complex-C (SUPER B COMPLEX PO) Take 1 tablet by mouth daily.      . calcium-vitamin D (OSCAL WITH D) 500-200 MG-UNIT per tablet Take 1 tablet by mouth every other day.       . Ferrous Sulfate (SLOW FE PO) Take 1 tablet by mouth every other day.      . fish oil-omega-3 fatty acids 1000 MG capsule Take 1 g by mouth daily.       . Flaxseed, Linseed, (FLAX SEEDS PO) Take  1 tablet by mouth daily.      Marland Kitchen glucosamine-chondroitin 500-400 MG tablet Take 1 tablet by mouth daily.      . metoprolol succinate (TOPROL-XL) 50 MG 24 hr tablet Take 1 tablet (50 mg total) by mouth daily before breakfast. Take with or immediately following a meal.  30 tablet  5  . montelukast (SINGULAIR) 10 MG tablet Take 10 mg by mouth at bedtime.      . Multiple Vitamin (MULTIVITAMIN WITH MINERALS) TABS Take 1  tablet by mouth every other day.       Marland Kitchen omeprazole (PRILOSEC) 20 MG capsule Take 20 mg by mouth daily. For heartburn      . PROVENTIL HFA 108 (90 BASE) MCG/ACT inhaler Inhale 2 puffs into the lungs every 4 (four) hours as needed.       . vitamin C (ASCORBIC ACID) 500 MG tablet Take 500 mg by mouth daily.      . vitamin E 1000 UNIT capsule Take 1,000 Units by mouth daily.       No current facility-administered medications for this visit.     Allergies:  Allergies  Allergen Reactions  . Anesthetics, Amide Nausea And Vomiting    Projectile vomitting-Nausea- with 24hrs of dry heaves. With any anesthetics  . Bactrim [Sulfamethoxazole-Trimethoprim] Other (See Comments)    Massive diarrhea, nausea vomiting, platelets dropped, hemoglobin dropped, admitted to hospital  . Sulfa Antibiotics Nausea And Vomiting and Other (See Comments)    Crazy feeling in head Life threatening reaction, decreased blood counts  . Codeine Nausea And Vomiting  . Codeine Phosphate Nausea And Vomiting  . Eggs Or Egg-Derived Products Hives    Medical History: Past Medical History  Diagnosis Date  . GERD (gastroesophageal reflux disease)   . Allergy   . PAT (paroxysmal atrial tachycardia)     hx  . PONV (postoperative nausea and vomiting) 09-13-12    severe, with Port-a-cath, was managed without PONV  . Clotting disorder     prothrombin gene mutation-heterozygous   . History of chemotherapy     Last dose to be 02-04-13.  Was rx'd with Herceptin & Gemzar  . DVT (deep venous thrombosis) 2014    BLE  . Asthma     Uses Inhalers Proventil as needed  . Bursitis     "both shoulders"  . History of blood transfusion 01-25-13    2 units 01-11-13 ("after chem")  . S/P radiation therapy 4-12 wks ago 03/31/13-05/17/13    right breast/supraclavicular fossa/posterior axillary boost  . Breast cancer 07/14/12    inflammatory right breast ca, ER/PR -  . H/O hiatal hernia   . Dysrhythmia     PAT-sees dr Lina Sayre meds  .  Pneumonia 01-25-13    hx. 12-27-12-hospital stay x 9 days, now resolved.  . Pneumonia     "episodic; all my life" (05/10/2014)  . Anemia     "after chemo"  . Arthritis     "knees; fingers; occasionally" (05/10/2014)  . Osteopenia     "lower spine only" (05/10/2014)  . Lymphedema     RUE    Surgical History:  Past Surgical History  Procedure Laterality Date  . Dilation and curettage of uterus  1998 X 3  . Tonsillectomy    . Colonoscopy    . Portacath placement  07/21/2012    Procedure: INSERTION PORT-A-CATH;  Surgeon: Rolm Bookbinder, MD;  Location: [place];  Service: General;  Laterality: Left;/ Replacement done 10'13  . Insertion of vena  cava filter  12/2012  . Tumor excision Right 1970    giant cell, off my forearm"  . Chalazion excision Right 1980's    right( MD office)  . Mastectomy modified radical Right 02/08/2013    Procedure: MASTECTOMY MODIFIED RADICAL;  Surgeon: Rolm Bookbinder, MD;  Location: [facility];  Service: General;  Laterality: Right;  . Port-a-cath removal  05/10/2014  . Mastectomy Left 05/10/2014    PROPHYLACTIC   . Mastectomy modified radical Right   . Tubal ligation  ~ 1974  . Breast biopsy Right 2013 X 3  . Simple mastectomy with axillary sentinel node biopsy Left 05/10/2014    Procedure: PROPHYLACTIC LEFT MASTECTOMY;  Surgeon: Rolm Bookbinder, MD;  Location: [place];  Service: General;  Laterality: Left;  . Port-a-cath removal N/A 05/10/2014    Procedure: REMOVAL PORT-A-CATH;  Surgeon: Rolm Bookbinder, MD;  Location: [place];  Service: General;  Laterality: N/A;     Review of Systems: A 10 point review of systems was conducted and is otherwise negative except for what is noted above.     Physical Exam: There were no vitals taken for this visit. GENERAL: Patient is a well appearing female in no acute distress HEENT:  Sclerae anicteric.  Oropharynx clear and moist. No ulcerations or evidence of oropharyngeal candidiasis. Neck is supple.   NODES:  No cervical, supraclavicular, or axillary lymphadenopathy palpated.  BREAST EXAM:  S/p bilateral mastectomy. No nodularity.  At the left upper mastectomy scar some mild erythema is noted, it is non-tender, non warm.  LUNGS:  Clear to auscultation bilaterally.  No wheezes or rhonchi. HEART:  Regular rate and rhythm. No murmur appreciated. ABDOMEN:  Soft, nontender.  Positive, normoactive bowel sounds. No organomegaly palpated. MSK:  No focal spinal tenderness to palpation. Full range of motion bilaterally in the upper extremities. EXTREMITIES:  No peripheral edema.  SKIN:  Clear with no obvious rashes or skin changes. No nail dyscrasia. NEURO:  Nonfocal. Well oriented.  Appropriate affect. ECOG PERFORMANCE STATUS: 1 - Symptomatic but completely ambulatory   Lab Results: Lab Results  Component Value Date   WBC 6.0 05/12/2014   HGB 10.5* 05/12/2014   HCT 31.7* 05/12/2014   MCV 89.8 05/12/2014   PLT 163 05/12/2014     Chemistry      Component Value Date/Time   NA 141 05/11/2014 0438   NA 141 05/08/2014 1019   K 4.1 05/11/2014 0438   K 4.0 05/08/2014 1019   CL 105 05/11/2014 0438   CL 105 05/20/2013 1459   CO2 27 05/11/2014 0438   CO2 28 05/08/2014 1019   BUN 16 05/11/2014 0438   BUN 18.9 05/08/2014 1019   CREATININE 0.84 05/11/2014 0438   CREATININE 0.9 05/08/2014 1019      Component Value Date/Time   CALCIUM 8.6 05/11/2014 0438   CALCIUM 9.4 05/08/2014 1019   ALKPHOS 66 05/08/2014 1019   ALKPHOS 66 01/28/2013 1630   AST 17 05/08/2014 1019   AST 36 01/28/2013 1630   ALT 21 05/08/2014 1019   ALT 55* 01/28/2013 1630   BILITOT 0.62 05/08/2014 1019   BILITOT 0.2* 01/28/2013 1630      Assessment and Plan: Jessica Wells 68 y.o. female with  1. Inflammatory HER-2/neu positive breast cancer.  Please see prior history above.  The patient underwent neoadjuvant chemotherapy/Herceptin followed by a right mastectomy and axillary node dissection that demonstrated a complete pathologic response to  chemotherapy.  This was followed by adjuvant radiation therapy and she completed one  calendar year of Herceptin.  She is doing well following her left mastectomy.  Patient will undergo CT chest in 3 months (see number 6), due to cytology positivity of adenocarcinoma in past bronch.  We will see her shortly following this to discuss results.   2. Lymphedema:  Lymphedema is improved and patient has recovered her ROM particularly in her left arm.   She will continue to f/u with PT as scheduled.    3.  DVT: Patient is doing well today.  Her INR is 2.7.  She is currently taking Coumadin 7.38m five times per week and 521mon Tuesday and Friday.  She is going to start Apixaban 48m34mO BID.  I spoke with the pharmacist at [facility] she does require prior authorization for this.  This was requested from [facility] managed care.  The patient will continue taking Coumadin until the Apixaban is ready from pharmacy.  Per Dr. GudLindi Adiehe will stop Coumadin x 2 days then start Apixaban.  The patient is in agreement with this, and verbalizes understanding of this plan.   Jessica Wells return in 3 months following CT chest with contrast for labs and follow up.  She knows to call us [place] the interim for any questions or concerns.  We can certainly see her sooner if needed.  I spent 25 minutes counseling the patient face to face.  The total time spent in the appointment was 30 minutes.  LinMinette HeadlandP [id number]/2015 1:40 PM  Attending Note  I personally saw and examined Jessica Augustahe plan of care was discussed with her. I agree with the assessment and plan as documented above. 1. inflammatory breast cancer ER/PR negative HER-2 positive. Patient will continue to be monitored as she has completed one year of Herceptin therapy and is currently undergoing surveillance. 2. DVT and PE patient requests change in therapy from Coumadin to Apixaban. I  discussed with her the pros and cons of Apixaban including the risks of major bleeds. Based on the recent studies it appears that the risk of major bleeding may be slightly less than Coumadin and she agrees with the risks and benefits and we'll start the new medication once her INR comes to less than to which could be in about couple of days. 3. January 2015 alkaline phosphatase to be done while she was in the hospital showed malignant cells on cytology scans done after that in May 2015 did not reveal any evidence of malignancy. this is extremely odd and we will obtain another CT chest in 3 months and followup after that to see us.[place]oreaSigned GudRulon EisenmengerD

## 2014-07-10 NOTE — Progress Notes (Signed)
RECEIVED A FAX FROM [facility] CONCERNING A PRIOR AUTHORIZATION FOR ELIQUIS. THIS REQUEST WAS PLACED IN THE MANAGED CARE BIN.

## 2014-07-11 ENCOUNTER — Encounter: Payer: Self-pay | Admitting: Oncology

## 2014-07-11 NOTE — Progress Notes (Signed)
Faxed eliquis pa form to [facility]

## 2014-07-11 NOTE — Progress Notes (Signed)
[facility], [id number], approved eliquis from 07/11/14-01/11/15

## 2014-07-19 ENCOUNTER — Other Ambulatory Visit: Payer: Self-pay | Admitting: Cardiology

## 2014-08-04 ENCOUNTER — Ambulatory Visit: Payer: Medicare Other | Attending: General Surgery | Admitting: Physical Therapy

## 2014-08-04 DIAGNOSIS — M24519 Contracture, unspecified shoulder: Secondary | ICD-10-CM | POA: Diagnosis not present

## 2014-08-04 DIAGNOSIS — I890 Lymphedema, not elsewhere classified: Secondary | ICD-10-CM | POA: Insufficient documentation

## 2014-08-04 DIAGNOSIS — C50919 Malignant neoplasm of unspecified site of unspecified female breast: Secondary | ICD-10-CM | POA: Diagnosis not present

## 2014-08-04 DIAGNOSIS — IMO0001 Reserved for inherently not codable concepts without codable children: Secondary | ICD-10-CM | POA: Insufficient documentation

## 2014-08-16 ENCOUNTER — Other Ambulatory Visit: Payer: Self-pay | Admitting: Cardiology

## 2014-09-01 ENCOUNTER — Ambulatory Visit: Payer: Medicare Other | Admitting: Physical Therapy

## 2014-09-04 ENCOUNTER — Encounter: Payer: Self-pay | Admitting: Cardiology

## 2014-09-04 ENCOUNTER — Other Ambulatory Visit: Payer: Self-pay | Admitting: *Deleted

## 2014-09-04 ENCOUNTER — Ambulatory Visit (INDEPENDENT_AMBULATORY_CARE_PROVIDER_SITE_OTHER): Payer: Medicare Other | Admitting: Cardiology

## 2014-09-04 VITALS — BP 128/88 | HR 79 | Ht 61.0 in | Wt 154.0 lb

## 2014-09-04 DIAGNOSIS — Z9010 Acquired absence of unspecified breast and nipple: Secondary | ICD-10-CM | POA: Diagnosis not present

## 2014-09-04 DIAGNOSIS — I471 Supraventricular tachycardia: Secondary | ICD-10-CM | POA: Diagnosis not present

## 2014-09-04 DIAGNOSIS — C50919 Malignant neoplasm of unspecified site of unspecified female breast: Secondary | ICD-10-CM

## 2014-09-04 MED ORDER — METOPROLOL SUCCINATE ER 50 MG PO TB24: ORAL_TABLET | ORAL | Status: DC

## 2014-09-04 NOTE — Progress Notes (Signed)
[facility] [address], [place]  [address] Phone: [phone] Fax:  [phone]  Date:  09/04/2014   ID:  Jessica Wells, DOB [birth year], MRN [id number]  PCP:  Gerrit Heck, MD   History of Present Illness: Jessica Wells is a 68 y.o. female with history of paroxysmal atrial tachycardia, DVT, inflammatory right-sided breast cancer on chronic anticoagulation, here for followup. Her last visit with me was 12/19/10. Was seeing Dr. Haroldine Laws. Toprol working well. Previously on Herceptin.  Had second mastectomy in 6/15. Dr. Donne Hazel.    Wt Readings from Last 3 Encounters:  09/04/14 154 lb (69.854 kg)  07/10/14 149 lb 14.4 oz (67.994 kg)  06/13/14 149 lb (67.586 kg)     Past Medical History  Diagnosis Date  . GERD (gastroesophageal reflux disease)   . Allergy   . PAT (paroxysmal atrial tachycardia)     hx  . PONV (postoperative nausea and vomiting) 09-13-12    severe, with Port-a-cath, was managed without PONV  . Clotting disorder     prothrombin gene mutation-heterozygous   . History of chemotherapy     Last dose to be 02-04-13.  Was rx'd with Herceptin & Gemzar  . DVT (deep venous thrombosis) 2014    BLE  . Asthma     Uses Inhalers Proventil as needed  . Bursitis     "both shoulders"  . History of blood transfusion 01-25-13    2 units 01-11-13 ("after chem")  . S/P radiation therapy 4-12 wks ago 03/31/13-05/17/13    right breast/supraclavicular fossa/posterior axillary boost  . Breast cancer 07/14/12    inflammatory right breast ca, ER/PR -  . H/O hiatal hernia   . Dysrhythmia     PAT-sees dr Lina Sayre meds  . Pneumonia 01-25-13    hx. 12-27-12-hospital stay x 9 days, now resolved.  . Pneumonia     "episodic; all my life" (05/10/2014)  . Anemia     "after chemo"  . Arthritis     "knees; fingers; occasionally" (05/10/2014)  . Osteopenia     "lower spine only" (05/10/2014)  . Lymphedema     RUE    Past Surgical History  Procedure Laterality  Date  . Dilation and curettage of uterus  1998 X 3  . Tonsillectomy    . Colonoscopy    . Portacath placement  07/21/2012    Procedure: INSERTION PORT-A-CATH;  Surgeon: Rolm Bookbinder, MD;  Location: [facility];  Service: General;  Laterality: Left;/ Replacement done 10'13  . Insertion of vena cava filter  12/2012  . Tumor excision Right 1970    giant cell, off my forearm"  . Chalazion excision Right 1980's    right( MD office)  . Mastectomy modified radical Right 02/08/2013    Procedure: MASTECTOMY MODIFIED RADICAL;  Surgeon: Rolm Bookbinder, MD;  Location: [facility];  Service: General;  Laterality: Right;  . Port-a-cath removal  05/10/2014  . Mastectomy Left 05/10/2014    PROPHYLACTIC   . Mastectomy modified radical Right   . Tubal ligation  ~ 1974  . Breast biopsy Right 2013 X 3  . Simple mastectomy with axillary sentinel node biopsy Left 05/10/2014    Procedure: PROPHYLACTIC LEFT MASTECTOMY;  Surgeon: Rolm Bookbinder, MD;  Location: [facility];  Service: General;  Laterality: Left;  . Port-a-cath removal N/A 05/10/2014    Procedure: REMOVAL PORT-A-CATH;  Surgeon: Rolm Bookbinder, MD;  Location: [facility];  Service: General;  Laterality: N/A;    Current Outpatient  Prescriptions  Medication Sig Dispense Refill  . B Complex-C (SUPER B COMPLEX PO) Take 1 tablet by mouth daily.      . calcium-vitamin D (OSCAL WITH D) 500-200 MG-UNIT per tablet Take 1 tablet by mouth every other day.       Marland Kitchen ELIQUIS 5 MG TABS tablet       . Ferrous Sulfate (SLOW FE PO) Take 1 tablet by mouth every other day.      . fish oil-omega-3 fatty acids 1000 MG capsule Take 1 g by mouth daily.       . Flaxseed, Linseed, (FLAX SEEDS PO) Take 1 tablet by mouth daily.      . fluticasone (FLONASE) 50 MCG/ACT nasal spray       . glucosamine-chondroitin 500-400 MG tablet Take 1 tablet by mouth daily.      . metoprolol succinate (TOPROL-XL) 50 MG 24 hr tablet TAKE ONE TABLET BY MOUTH ONCE DAILY BEFORE BREAKFAST  *TAKE  WITH  OR  IMMEDIATELY  FOLLOWING  A  MEAL*NEED  APPOINTMENT*  30 tablet  0  . montelukast (SINGULAIR) 10 MG tablet Take 10 mg by mouth at bedtime.      . Multiple Vitamin (MULTIVITAMIN WITH MINERALS) TABS Take 1 tablet by mouth every other day.       Marland Kitchen omeprazole (PRILOSEC) 20 MG capsule Take 20 mg by mouth daily. For heartburn      . PROVENTIL HFA 108 (90 BASE) MCG/ACT inhaler Inhale 2 puffs into the lungs every 4 (four) hours as needed.       . vitamin C (ASCORBIC ACID) 500 MG tablet Take 500 mg by mouth daily.      . vitamin E 1000 UNIT capsule Take 1,000 Units by mouth daily.       No current facility-administered medications for this visit.    Allergies:    Allergies  Allergen Reactions  . Anesthetics, Amide Nausea And Vomiting    Projectile vomitting-Nausea- with 24hrs of dry heaves. With any anesthetics  . Bactrim [Sulfamethoxazole-Trimethoprim] Other (See Comments)    Massive diarrhea, nausea vomiting, platelets dropped, hemoglobin dropped, admitted to hospital  . Sulfa Antibiotics Nausea And Vomiting and Other (See Comments)    Crazy feeling in head Life threatening reaction, decreased blood counts  . Codeine Nausea And Vomiting  . Codeine Phosphate Nausea And Vomiting  . Eggs Or Egg-Derived Products Hives    Social History:  The patient  reports that she quit smoking about 35 years ago. Her smoking use included Cigarettes. She has a 15 pack-year smoking history. She has never used smokeless tobacco. She reports that she does not drink alcohol or use illicit drugs.   Family History  Problem Relation Age of Onset  . Diabetes Mother   . Heart disease Mother   . Hypertension Mother   . Hyperlipidemia Mother   . Breast cancer Mother 70    lobular breast cancer  . Heart disease Maternal Aunt   . Heart disease Maternal Uncle   . Heart disease Maternal Grandmother     died in her 7s from heart disease  . Lung cancer Father 44    adenocarcinoma  . Cancer Paternal  Uncle     dx in mid 49s with a cancer in the leg, died late 68s; grandpaternal half uncle    ROS:  Please see the history of present illness.   No syncope, no excessive palpitations. Recent sinus infection, taking albuterol. No chest pain.   All other systems  reviewed and negative.   PHYSICAL EXAM: VS:  BP 128/88  Pulse 79  Ht 5\' 1"  (1.549 m)  Wt 154 lb (69.854 kg)  BMI 29.11 kg/m2 Well nourished, well developed, in no acute distress HEENT: normal, [place]/AT, EOMI Neck: no JVD, normal carotid upstroke, no bruit Cardiac:  normal S1, S2; RRR; no murmur Lungs:  clear to auscultation bilaterally, no wheezing, rhonchi or rales Abd: soft, nontender, no hepatomegaly, no bruits Ext: no edema, 2+ distal pulses Skin: warm and dry GU: deferred Neuro: no focal abnormalities noted, AAO x 3  EKG:  NSR 86.  ECHOS:  07/22/12 EF 60-65% lateral s' 10.5  10/2012 EF 60-65% lateral s' 10.7  01/19/13 EF 55-60% lateral S' 13.0 No evidence of RV strain.  03/21/13 EF 55-60% lateral s' 9.8 (remeasured by me today) Global Strain -16  06/20/13 EF 55-60% Lateral S' 9.3 Global Strain -24  09/21/13 EF 60% Lateral s' 9.9 Global strain -19%  ASSESSMENT AND PLAN:  1. Supraventricular tachycardia-PAT-doing very well. Continue with Toprol. She is enjoying this. If they become more frequent, consider ablative therapy. We discussed this once again. 2. Chronic anticoagulation-warfarin-Eliquis. IVC filter. Positive prothrombin gene mutation, heterozygous. 3. Breast cancer-recently saw Dr. Donne Hazel. Doing well. Right-sided. 4. History of pulmonary embolism-IVC filter. Eliquis 5. We will see back in 12 months.  Signed, Candee Furbish, MD [facility]  09/04/2014 1:49 PM

## 2014-09-04 NOTE — Patient Instructions (Signed)
The current medical regimen is effective;  continue present plan and medications.  Follow up in 1 year with Dr Skains.  You will receive a letter in the mail 2 months before you are due.  Please call us when you receive this letter to schedule your follow up appointment.  

## 2014-09-07 ENCOUNTER — Ambulatory Visit: Payer: Medicare Other | Admitting: Cardiology

## 2014-09-22 ENCOUNTER — Other Ambulatory Visit: Payer: Medicare Other

## 2014-09-22 ENCOUNTER — Ambulatory Visit: Payer: Medicare Other | Admitting: Adult Health

## 2014-09-22 DIAGNOSIS — H04123 Dry eye syndrome of bilateral lacrimal glands: Secondary | ICD-10-CM | POA: Diagnosis not present

## 2014-10-02 ENCOUNTER — Encounter: Payer: Self-pay | Admitting: Cardiology

## 2014-10-05 ENCOUNTER — Other Ambulatory Visit: Payer: Self-pay

## 2014-10-05 DIAGNOSIS — C50119 Malignant neoplasm of central portion of unspecified female breast: Secondary | ICD-10-CM

## 2014-10-09 ENCOUNTER — Other Ambulatory Visit: Payer: Self-pay | Admitting: Adult Health

## 2014-10-10 ENCOUNTER — Ambulatory Visit (HOSPITAL_COMMUNITY)
Admission: RE | Admit: 2014-10-10 | Discharge: 2014-10-10 | Disposition: A | Discharge status: Home / Self Care | Payer: Medicare Other | Source: Ambulatory Visit | Attending: Adult Health | Admitting: Adult Health

## 2014-10-10 ENCOUNTER — Other Ambulatory Visit (HOSPITAL_BASED_OUTPATIENT_CLINIC_OR_DEPARTMENT_OTHER): Payer: Medicare Other

## 2014-10-10 DIAGNOSIS — I82403 Acute embolism and thrombosis of unspecified deep veins of lower extremity, bilateral: Secondary | ICD-10-CM

## 2014-10-10 DIAGNOSIS — C50911 Malignant neoplasm of unspecified site of right female breast: Secondary | ICD-10-CM

## 2014-10-10 DIAGNOSIS — R918 Other nonspecific abnormal finding of lung field: Secondary | ICD-10-CM | POA: Diagnosis not present

## 2014-10-10 DIAGNOSIS — E079 Disorder of thyroid, unspecified: Secondary | ICD-10-CM | POA: Diagnosis not present

## 2014-10-10 DIAGNOSIS — K449 Diaphragmatic hernia without obstruction or gangrene: Secondary | ICD-10-CM | POA: Diagnosis not present

## 2014-10-10 DIAGNOSIS — C773 Secondary and unspecified malignant neoplasm of axilla and upper limb lymph nodes: Secondary | ICD-10-CM

## 2014-10-10 DIAGNOSIS — Z9013 Acquired absence of bilateral breasts and nipples: Secondary | ICD-10-CM | POA: Insufficient documentation

## 2014-10-10 DIAGNOSIS — Z9221 Personal history of antineoplastic chemotherapy: Secondary | ICD-10-CM | POA: Insufficient documentation

## 2014-10-10 DIAGNOSIS — Z923 Personal history of irradiation: Secondary | ICD-10-CM | POA: Insufficient documentation

## 2014-10-10 DIAGNOSIS — C50919 Malignant neoplasm of unspecified site of unspecified female breast: Secondary | ICD-10-CM | POA: Insufficient documentation

## 2014-10-10 DIAGNOSIS — C50119 Malignant neoplasm of central portion of unspecified female breast: Secondary | ICD-10-CM

## 2014-10-10 DIAGNOSIS — K760 Fatty (change of) liver, not elsewhere classified: Secondary | ICD-10-CM | POA: Diagnosis not present

## 2014-10-10 LAB — CBC WITH DIFFERENTIAL/PLATELET
BASO%: 1 % (ref 0.0–2.0)
Basophils Absolute: 0.1 10*3/uL (ref 0.0–0.1)
EOS%: 3.5 % (ref 0.0–7.0)
Eosinophils Absolute: 0.2 10*3/uL (ref 0.0–0.5)
HCT: 39.9 % (ref 34.8–46.6)
HGB: 13.2 g/dL (ref 11.6–15.9)
LYMPH%: 26.1 % (ref 14.0–49.7)
MCH: 29.5 pg (ref 25.1–34.0)
MCHC: 33 g/dL (ref 31.5–36.0)
MCV: 89.5 fL (ref 79.5–101.0)
MONO#: 0.4 10*3/uL (ref 0.1–0.9)
MONO%: 6.8 % (ref 0.0–14.0)
NEUT#: 3.7 10*3/uL (ref 1.5–6.5)
NEUT%: 62.6 % (ref 38.4–76.8)
Platelets: 185 10*3/uL (ref 145–400)
RBC: 4.46 10*6/uL (ref 3.70–5.45)
RDW: 13.6 % (ref 11.2–14.5)
WBC: 5.9 10*3/uL (ref 3.9–10.3)
lymph#: 1.5 10*3/uL (ref 0.9–3.3)

## 2014-10-10 LAB — COMPREHENSIVE METABOLIC PANEL (CC13)
ALT: 19 U/L (ref 0–55)
AST: 16 U/L (ref 5–34)
Albumin: 4 g/dL (ref 3.5–5.0)
Alkaline Phosphatase: 79 U/L (ref 40–150)
Anion Gap: 8 mEq/L (ref 3–11)
BUN: 22.8 mg/dL (ref 7.0–26.0)
CO2: 29 mEq/L (ref 22–29)
Calcium: 9.5 mg/dL (ref 8.4–10.4)
Chloride: 105 mEq/L (ref 98–109)
Creatinine: 0.9 mg/dL (ref 0.6–1.1)
Glucose: 120 mg/dl (ref 70–140)
Potassium: 4.2 mEq/L (ref 3.5–5.1)
Sodium: 142 mEq/L (ref 136–145)
Total Bilirubin: 0.44 mg/dL (ref 0.20–1.20)
Total Protein: 7.1 g/dL (ref 6.4–8.3)

## 2014-10-10 MED ORDER — IOHEXOL 300 MG/ML  SOLN
80.0000 mL | Freq: Once | INTRAMUSCULAR | Status: AC | PRN
  Administered 2014-10-10: 80 mL via INTRAVENOUS

## 2014-10-11 ENCOUNTER — Telehealth: Payer: Self-pay | Admitting: *Deleted

## 2014-10-11 NOTE — Telephone Encounter (Signed)
Called to inform pt of results concerning CT/Chest. Communicated with pt that results of scan was good,showed no evidence of metastatic disease to the chest. I did share with the pt about the hiatal hernia the scan revealed but she already knew this. Pt was pleased with results. Pt verbalized understanding. No further concerns. Message to be forwarded to Charlestine Massed, NP.

## 2014-10-12 ENCOUNTER — Ambulatory Visit (HOSPITAL_BASED_OUTPATIENT_CLINIC_OR_DEPARTMENT_OTHER): Payer: Medicare Other | Admitting: Hematology and Oncology

## 2014-10-12 VITALS — BP 122/70 | HR 85 | Temp 98.3°F | Resp 18 | Ht 61.0 in | Wt 152.8 lb

## 2014-10-12 DIAGNOSIS — I82409 Acute embolism and thrombosis of unspecified deep veins of unspecified lower extremity: Secondary | ICD-10-CM

## 2014-10-12 DIAGNOSIS — I2699 Other pulmonary embolism without acute cor pulmonale: Secondary | ICD-10-CM | POA: Diagnosis not present

## 2014-10-12 DIAGNOSIS — C50811 Malignant neoplasm of overlapping sites of right female breast: Secondary | ICD-10-CM

## 2014-10-12 DIAGNOSIS — R911 Solitary pulmonary nodule: Secondary | ICD-10-CM | POA: Diagnosis not present

## 2014-10-12 DIAGNOSIS — C50111 Malignant neoplasm of central portion of right female breast: Secondary | ICD-10-CM

## 2014-10-12 NOTE — Progress Notes (Signed)
Patient Care Team: Gerrit Heck, MD as PCP - General (Family Medicine)  DIAGNOSIS: No matching staging information was found for the patient.  SUMMARY OF ONCOLOGIC HISTORY:   Cancer of central portion of right female breast   07/14/2012 Mammogram And invasive mammary carcinoma with lymphovascular invasion grade 3 ER negative PR negative HER-2 positive inflammatory breast cancer   07/19/2012 Breast MRI Right breast neoplasm level I and right axillary lymph node compatible with lymph node metastases biopsy proven to be invasive mammary cancer   07/23/2012 - 12/17/2012 Neo-Adjuvant Chemotherapy Neoadjuvant FEC 100 followed by Taxol Herceptin due to neuropathy Taxol was replaced by gemcitabine x5 cycles   12/07/2012 -  Hospital Admission Bilateral lower extremity DVT, workup showed prothrombin gene mutation heterozygous and IVC filter, bilateral pulmonary emboli and acute respiratory failure requiring vent support bronchoscopy and BAL concerning for adenocarcinoma, PET/CT negative   02/08/2013 Surgery Right mastectomy showed complete response to chemotherapy in the breast and 11 lymph nodes negative   02/25/2013 - 10/14/2013 Chemotherapy Herceptin maintenance for one year   03/31/2013 - 05/17/2013 Radiation Therapy Adjuvant radiation therapy    05/10/2014 Surgery Left mastectomy    CHIEF COMPLIANT: followup of breast cancer  INTERVAL HISTORY: Jessica Wells is a 68 year old Caucasian with above-mentioned history of HER-2 positive with inflammatory breast cancer treated with neoadjuvant chemotherapy followed by mastectomy and adjuvant radiation therapy. She is currently on observation. As stated above she had respiratory failure requiring a bronchoscopy that revealed adenocarcinoma cells. But over the course of past 2 years, there has not been any evidence of metastatic lung disease. Most recently she underwent a CT of the chest which was normal. She did have small lung nodules but there were stable  from before.   REVIEW OF SYSTEMS:   Constitutional: Denies fevers, chills or abnormal weight loss Eyes: Denies blurriness of vision Ears, nose, mouth, throat, and face: Denies mucositis or sore throat Respiratory: Denies cough, dyspnea or wheezes Cardiovascular: Denies palpitation, chest discomfort or lower extremity swelling Gastrointestinal:  Denies nausea, heartburn or change in bowel habits Skin: itching sensation Lymphatics: Denies new lymphadenopathy or easy bruising Neurological:Denies numbness, tingling or new weaknesses Behavioral/Psych: Mood is stable, no new changes  Breast: bilateral mastectomy All other systems were reviewed with the patient and are negative.  I have reviewed the past medical history, past surgical history, social history and family history with the patient and they are unchanged from previous note.  ALLERGIES:  is allergic to anesthetics, amide; bactrim; sulfa antibiotics; codeine; codeine phosphate; and eggs or egg-derived products.  MEDICATIONS:  Current Outpatient Prescriptions  Medication Sig Dispense Refill  . B Complex-C (SUPER B COMPLEX PO) Take 1 tablet by mouth daily.    . calcium-vitamin D (OSCAL WITH D) 500-200 MG-UNIT per tablet Take 1 tablet by mouth every other day.     Marland Kitchen ELIQUIS 5 MG TABS tablet TAKE ONE TABLET BY MOUTH TWICE DAILY 60 tablet 0  . Ferrous Sulfate (SLOW FE PO) Take 1 tablet by mouth every other day.    . fish oil-omega-3 fatty acids 1000 MG capsule Take 1 g by mouth daily.     . Flaxseed, Linseed, (FLAX SEEDS PO) Take 1 tablet by mouth daily.    . fluticasone (FLONASE) 50 MCG/ACT nasal spray     . glucosamine-chondroitin 500-400 MG tablet Take 1 tablet by mouth daily.    . metoprolol succinate (TOPROL-XL) 50 MG 24 hr tablet TAKE ONE TABLET BY MOUTH ONCE DAILY BEFORE BREAKFAST -TAKE  WITH  OR  IMMEDIATELY  FOLLOWING  A  MEAL 30 tablet 11  . montelukast (SINGULAIR) 10 MG tablet Take 10 mg by mouth at bedtime.    . Multiple  Vitamin (MULTIVITAMIN WITH MINERALS) TABS Take 1 tablet by mouth every other day.     Marland Kitchen omeprazole (PRILOSEC) 20 MG capsule Take 20 mg by mouth daily. For heartburn    . PROVENTIL HFA 108 (90 BASE) MCG/ACT inhaler Inhale 2 puffs into the lungs every 4 (four) hours as needed.     . vitamin C (ASCORBIC ACID) 500 MG tablet Take 500 mg by mouth daily.    . vitamin E 1000 UNIT capsule Take 1,000 Units by mouth daily.     No current facility-administered medications for this visit.    PHYSICAL EXAMINATION: ECOG PERFORMANCE STATUS: 1 - Symptomatic but completely ambulatory  Filed Vitals:   10/12/14 1437  BP: 122/70  Pulse: 85  Temp: 98.3 F (36.8 C)  Resp: 18   Filed Weights   10/12/14 1437  Weight: 152 lb 12.8 oz (69.31 kg)    GENERAL:alert, no distress and comfortable SKIN: skin color, texture, turgor are normal, no rashes or significant lesions EYES: normal, Conjunctiva are pink and non-injected, sclera clear OROPHARYNX:no exudate, no erythema and lips, buccal mucosa, and tongue normal  NECK: supple, thyroid normal size, non-tender, without nodularity LYMPH:  no palpable lymphadenopathy in the cervical, axillary or inguinal LUNGS: clear to auscultation and percussion with normal breathing effort HEART: regular rate & rhythm and no murmurs and no lower extremity edema ABDOMEN:abdomen soft, non-tender and normal bowel sounds Musculoskeletal:no cyanosis of digits and no clubbing  NEURO: alert & oriented x 3 with fluent speech, no focal motor/sensory deficits  LABORATORY DATA:  I have reviewed the data as listed   Chemistry      Component Value Date/Time   NA 142 10/10/2014 0906   NA 141 05/11/2014 0438   K 4.2 10/10/2014 0906   K 4.1 05/11/2014 0438   CL 105 05/11/2014 0438   CL 105 05/20/2013 1459   CO2 29 10/10/2014 0906   CO2 27 05/11/2014 0438   BUN 22.8 10/10/2014 0906   BUN 16 05/11/2014 0438   CREATININE 0.9 10/10/2014 0906   CREATININE 0.84 05/11/2014 0438       Component Value Date/Time   CALCIUM 9.5 10/10/2014 0906   CALCIUM 8.6 05/11/2014 0438   ALKPHOS 79 10/10/2014 0906   ALKPHOS 66 01/28/2013 1630   AST 16 10/10/2014 0906   AST 36 01/28/2013 1630   ALT 19 10/10/2014 0906   ALT 55* 01/28/2013 1630   BILITOT 0.44 10/10/2014 0906   BILITOT 0.2* 01/28/2013 1630       Lab Results  Component Value Date   WBC 5.9 10/10/2014   HGB 13.2 10/10/2014   HCT 39.9 10/10/2014   MCV 89.5 10/10/2014   PLT 185 10/10/2014   NEUTROABS 3.7 10/10/2014    ASSESSMENT & PLAN:  Cancer of central portion of right female breast Interval history breast cancer on the right side ER/PR negative HER-2 positive status post neoadjuvant chemotherapy with Herceptin followed by a right mastectomy, patient had a complete pathologic response followed by radiation and completion of Herceptin maintenance. Patient was admitted to the hospital with respiratory failure with lung infiltrates and bronchoscopy revealed adenocarcinoma but no pathologic nodularity to followup or 2 years with a PET/CT scan CT scans. So she is currently being observed  Lungs adenocarcinoma cells: CT scan of the chest that revealed mild nodularity  but no evidence of metastatic disease. The nodules in the lung appear to be stable.  DVT and PE: Patient switched to Apixaban and is tolerating it very well other than mild itching. No problems with bruising or bleeding.  Return to clinic in 6 months for followup. Her blood work revealed no abnormalities.   No orders of the defined types were placed in this encounter.   The patient has a good understanding of the overall plan. she agrees with it. She will call with any problems that may develop before her next visit here.  I spent 20 minutes counseling the patient face to face. The total time spent in the appointment was 25 minutes and more than 50% was on counseling and review of test results    Rulon Eisenmenger, MD 10/12/2014 3:06 PM

## 2014-10-12 NOTE — Assessment & Plan Note (Signed)
Interval history breast cancer on the right side ER/PR negative HER-2 positive status post neoadjuvant chemotherapy with Herceptin followed by a right mastectomy, patient had a complete pathologic response followed by radiation and completion of Herceptin maintenance. Patient was admitted to the hospital with respiratory failure with lung infiltrates and bronchoscopy revealed adenocarcinoma but no pathologic nodularity to followup or 2 years with a PET/CT scan CT scans. So she is currently being observed  Lungs adenocarcinoma cells: CT scan of the chest that revealed mild nodularity but no evidence of metastatic disease. The nodules in the lung appear to be stable.  DVT and PE: Patient switched to Apixaban and is tolerating it very well other than mild itching. No problems with bruising or bleeding.  Return to clinic in 6 months for followup. Her blood work revealed no abnormalities.

## 2014-10-13 ENCOUNTER — Telehealth: Payer: Self-pay | Admitting: Hematology and Oncology

## 2014-11-10 ENCOUNTER — Other Ambulatory Visit: Payer: Self-pay | Admitting: Adult Health

## 2014-11-10 DIAGNOSIS — I82403 Acute embolism and thrombosis of unspecified deep veins of lower extremity, bilateral: Secondary | ICD-10-CM

## 2014-12-07 ENCOUNTER — Other Ambulatory Visit: Payer: Self-pay | Admitting: Hematology and Oncology

## 2015-01-01 ENCOUNTER — Encounter: Payer: Self-pay | Admitting: Physical Therapy

## 2015-01-05 ENCOUNTER — Telehealth: Payer: Self-pay | Admitting: *Deleted

## 2015-01-05 ENCOUNTER — Other Ambulatory Visit: Payer: Self-pay | Admitting: *Deleted

## 2015-01-05 DIAGNOSIS — C50111 Malignant neoplasm of central portion of right female breast: Secondary | ICD-10-CM

## 2015-01-05 MED ORDER — RIVAROXABAN 20 MG PO TABS: 20.0000 mg | ORAL_TABLET | Freq: Every day | ORAL | Status: DC

## 2015-01-05 NOTE — Telephone Encounter (Signed)
MY CHART MESSAGE WAS SENT TO TERESA BROWN AT [facility]. MY CHART MESSAGE WAS PRINTED AND GIVEN TO [place].

## 2015-01-05 NOTE — Telephone Encounter (Signed)
Patient sent email through mychart wanting to switch from Eliquis to Xarelto. Spoke with Dr. Lindi Adie, rx e scribed. Patient called and informed to stop Eliquis when she starts Xarelto. Patient verbalized understanding.

## 2015-01-06 ENCOUNTER — Encounter: Payer: Self-pay | Admitting: Hematology and Oncology

## 2015-01-08 ENCOUNTER — Telehealth: Payer: Self-pay | Admitting: *Deleted

## 2015-01-08 ENCOUNTER — Encounter: Payer: Self-pay | Admitting: Hematology and Oncology

## 2015-01-08 NOTE — Progress Notes (Signed)
[facility] approved xarelto 20mg  from 01/08/15-01/09/16

## 2015-01-08 NOTE — Progress Notes (Signed)
Faxed xarelto pa form to [facility]

## 2015-01-08 NOTE — Telephone Encounter (Signed)
Received fax from pharmacy stating Xarelto 20mg  tablets needs prior authorization. Inbox message sent to [facility] [employer].  Contact # for prior authorization to call is [phone]

## 2015-01-09 ENCOUNTER — Telehealth: Payer: Self-pay

## 2015-01-09 NOTE — Telephone Encounter (Signed)
Let pt know that xarelto was pre-authorized.  Called and LM with pharmacy on approval.  Pt voiced understanding.

## 2015-01-11 DIAGNOSIS — L309 Dermatitis, unspecified: Secondary | ICD-10-CM | POA: Diagnosis not present

## 2015-02-08 DIAGNOSIS — E785 Hyperlipidemia, unspecified: Secondary | ICD-10-CM | POA: Diagnosis not present

## 2015-02-08 DIAGNOSIS — Z853 Personal history of malignant neoplasm of breast: Secondary | ICD-10-CM | POA: Diagnosis not present

## 2015-02-08 DIAGNOSIS — Z9013 Acquired absence of bilateral breasts and nipples: Secondary | ICD-10-CM | POA: Diagnosis not present

## 2015-02-08 DIAGNOSIS — R21 Rash and other nonspecific skin eruption: Secondary | ICD-10-CM | POA: Diagnosis not present

## 2015-02-08 DIAGNOSIS — L299 Pruritus, unspecified: Secondary | ICD-10-CM | POA: Diagnosis not present

## 2015-02-08 DIAGNOSIS — R7301 Impaired fasting glucose: Secondary | ICD-10-CM | POA: Diagnosis not present

## 2015-02-15 DIAGNOSIS — B86 Scabies: Secondary | ICD-10-CM | POA: Diagnosis not present

## 2015-03-05 DIAGNOSIS — C50911 Malignant neoplasm of unspecified site of right female breast: Secondary | ICD-10-CM | POA: Diagnosis not present

## 2015-04-13 ENCOUNTER — Telehealth: Payer: Self-pay | Admitting: Hematology and Oncology

## 2015-04-13 ENCOUNTER — Ambulatory Visit (HOSPITAL_BASED_OUTPATIENT_CLINIC_OR_DEPARTMENT_OTHER): Payer: Medicare Other | Admitting: Hematology and Oncology

## 2015-04-13 VITALS — BP 127/68 | HR 76 | Temp 98.0°F | Resp 18 | Ht 61.0 in | Wt 147.0 lb

## 2015-04-13 DIAGNOSIS — R911 Solitary pulmonary nodule: Secondary | ICD-10-CM

## 2015-04-13 DIAGNOSIS — C50111 Malignant neoplasm of central portion of right female breast: Secondary | ICD-10-CM | POA: Diagnosis not present

## 2015-04-13 DIAGNOSIS — C773 Secondary and unspecified malignant neoplasm of axilla and upper limb lymph nodes: Secondary | ICD-10-CM | POA: Diagnosis not present

## 2015-04-13 DIAGNOSIS — Z86718 Personal history of other venous thrombosis and embolism: Secondary | ICD-10-CM | POA: Diagnosis not present

## 2015-04-13 NOTE — Progress Notes (Signed)
Patient Care Team: Leighton Ruff, MD as PCP - General (Family Medicine)  DIAGNOSIS: No matching staging information was found for the patient.  SUMMARY OF ONCOLOGIC HISTORY:   Cancer of central portion of right female breast   07/14/2012 Mammogram And invasive mammary carcinoma with lymphovascular invasion grade 3 ER negative PR negative HER-2 positive inflammatory breast cancer   07/19/2012 Breast MRI Right breast neoplasm level I and right axillary lymph node compatible with lymph node metastases biopsy proven to be invasive mammary cancer   07/23/2012 - 12/17/2012 Neo-Adjuvant Chemotherapy Neoadjuvant FEC 100 followed by Taxol Herceptin due to neuropathy Taxol was replaced by gemcitabine x5 cycles   12/07/2012 -  Hospital Admission Bilateral lower extremity DVT, workup showed prothrombin gene mutation heterozygous and IVC filter, bilateral pulmonary emboli and acute respiratory failure requiring vent support bronchoscopy and BAL concerning for adenocarcinoma, PET/CT negative   02/08/2013 Surgery Right mastectomy showed complete response to chemotherapy in the breast and 11 lymph nodes negative   02/25/2013 - 10/14/2013 Chemotherapy Herceptin maintenance for one year   03/31/2013 - 05/17/2013 Radiation Therapy Adjuvant radiation therapy    05/10/2014 Surgery Left mastectomy    CHIEF COMPLIANT: Follow-up of right breast cancer  INTERVAL HISTORY: Jessica Wells is a 69 year old lady with above-mentioned history of right-sided breast cancer in August 2013 underwent neoadjuvant chemotherapy followed by mastectomy and Herceptin maintenance for 1 year but finally she decided to do a left mastectomy as well. She had radiation therapy as well. She is currently in observation. During one of her hospitalization for DVT in January 2014 she was found to have adenocarcinoma bronchoalveolar lavage. Says that we are watching her once a year for lung involvement. She'll be due for CT scan of November 2016.  REVIEW OF  SYSTEMS:   Constitutional: Denies fevers, chills or abnormal weight loss Eyes: Denies blurriness of vision Ears, nose, mouth, throat, and face: Denies mucositis or sore throat Respiratory: Denies cough, dyspnea or wheezes Cardiovascular: Denies palpitation, chest discomfort or lower extremity swelling Gastrointestinal:  Denies nausea, heartburn or change in bowel habits Skin: Denies abnormal skin rashes Lymphatics: Denies new lymphadenopathy or easy bruising Neurological:Denies numbness, tingling or new weaknesses Behavioral/Psych: Mood is stable, no new changes  Breast:  denies any pain or lumps or nodules in either breasts All other systems were reviewed with the patient and are negative.  I have reviewed the past medical history, past surgical history, social history and family history with the patient and they are unchanged from previous note.  ALLERGIES:  is allergic to anesthetics, amide; bactrim; sulfa antibiotics; codeine; codeine phosphate; and eggs or egg-derived products.  MEDICATIONS:  Current Outpatient Prescriptions  Medication Sig Dispense Refill  . B Complex-C (SUPER B COMPLEX PO) Take 1 tablet by mouth daily.    . calcium-vitamin D (OSCAL WITH D) 500-200 MG-UNIT per tablet Take 1 tablet by mouth every other day.     . Ferrous Sulfate (SLOW FE PO) Take 1 tablet by mouth every other day.    . fish oil-omega-3 fatty acids 1000 MG capsule Take 1 g by mouth daily.     . Flaxseed, Linseed, (FLAX SEEDS PO) Take 1 tablet by mouth daily.    . fluticasone (FLONASE) 50 MCG/ACT nasal spray     . glucosamine-chondroitin 500-400 MG tablet Take 1 tablet by mouth daily.    . metoprolol succinate (TOPROL-XL) 50 MG 24 hr tablet TAKE ONE TABLET BY MOUTH ONCE DAILY BEFORE BREAKFAST -TAKE  WITH  OR  IMMEDIATELY  FOLLOWING  A  MEAL 30 tablet 11  . montelukast (SINGULAIR) 10 MG tablet Take 10 mg by mouth at bedtime.    . Multiple Vitamin (MULTIVITAMIN WITH MINERALS) TABS Take 1 tablet by  mouth every other day.     Marland Kitchen omeprazole (PRILOSEC) 20 MG capsule Take 20 mg by mouth daily. For heartburn    . PROVENTIL HFA 108 (90 BASE) MCG/ACT inhaler Inhale 2 puffs into the lungs every 4 (four) hours as needed.     . rivaroxaban (XARELTO) 20 MG TABS tablet Take 1 tablet (20 mg total) by mouth daily with supper. 30 tablet 3  . vitamin C (ASCORBIC ACID) 500 MG tablet Take 500 mg by mouth daily.    . vitamin E 1000 UNIT capsule Take 1,000 Units by mouth daily.     No current facility-administered medications for this visit.    PHYSICAL EXAMINATION: ECOG PERFORMANCE STATUS: 0 - Asymptomatic  Filed Vitals:   04/13/15 1113  BP: 127/68  Pulse: 76  Temp: 98 F (36.7 C)  Resp: 18   Filed Weights   04/13/15 1113  Weight: 147 lb (66.679 kg)    GENERAL:alert, no distress and comfortable SKIN: skin color, texture, turgor are normal, no rashes or significant lesions EYES: normal, Conjunctiva are pink and non-injected, sclera clear OROPHARYNX:no exudate, no erythema and lips, buccal mucosa, and tongue normal  NECK: supple, thyroid normal size, non-tender, without nodularity LYMPH:  no palpable lymphadenopathy in the cervical, axillary or inguinal LUNGS: clear to auscultation and percussion with normal breathing effort HEART: regular rate & rhythm and no murmurs and no lower extremity edema ABDOMEN:abdomen soft, non-tender and normal bowel sounds Musculoskeletal:no cyanosis of digits and no clubbing  NEURO: alert & oriented x 3 with fluent speech, no focal motor/sensory deficits BREAST: No palpable masses or nodules in bilateral chest wall exam (exam performed in the presence of a chaperone)  LABORATORY DATA:  I have reviewed the data as listed   Chemistry      Component Value Date/Time   NA 142 10/10/2014 0906   NA 141 05/11/2014 0438   K 4.2 10/10/2014 0906   K 4.1 05/11/2014 0438   CL 105 05/11/2014 0438   CL 105 05/20/2013 1459   CO2 29 10/10/2014 0906   CO2 27 05/11/2014  0438   BUN 22.8 10/10/2014 0906   BUN 16 05/11/2014 0438   CREATININE 0.9 10/10/2014 0906   CREATININE 0.84 05/11/2014 0438      Component Value Date/Time   CALCIUM 9.5 10/10/2014 0906   CALCIUM 8.6 05/11/2014 0438   ALKPHOS 79 10/10/2014 0906   ALKPHOS 66 01/28/2013 1630   AST 16 10/10/2014 0906   AST 36 01/28/2013 1630   ALT 19 10/10/2014 0906   ALT 55* 01/28/2013 1630   BILITOT 0.44 10/10/2014 0906   BILITOT 0.2* 01/28/2013 1630       Lab Results  Component Value Date   WBC 5.9 10/10/2014   HGB 13.2 10/10/2014   HCT 39.9 10/10/2014   MCV 89.5 10/10/2014   PLT 185 10/10/2014   NEUTROABS 3.7 10/10/2014   ASSESSMENT & PLAN:  Cancer of central portion of right female breast Right breast inflammation breast cancer diagnosed August 2013 treated with neoadjuvant chemotherapy completed 12/17/2012 hospitalized for bilateral DVT and respiratory failure, bronchoscopy showed adenocarcinoma, right mastectomy 02/08/2013 followed by Herceptin maintenance completed 10/14/2013, adjuvant radiation completed 05/17/2013.  Lung adenocarcinoma: CT scans done 10/10/2014 negative for cancer, stable left-sided groundglass pulmonary nodules  Breast Cancer Surveillance: 1. Chest  wall exam 04/13/2015: Normal 2. Mammogram: No role of mammogram since she had bilateral mastectomies  Return to clinic in 6 months with another CT chest and follow-up . After that we will see her once a year alternating with Dr. Donne Hazel.   No orders of the defined types were placed in this encounter.   The patient has a good understanding of the overall plan. she agrees with it. she will call with any problems that may develop before the next visit here.   Rulon Eisenmenger, MD

## 2015-04-13 NOTE — Telephone Encounter (Signed)
Appointments made and avs printed for patient °

## 2015-04-13 NOTE — Assessment & Plan Note (Signed)
Right breast inflammation breast cancer diagnosed August 2013 treated with neoadjuvant chemotherapy completed 12/17/2012 hospitalized for bilateral DVT and respiratory failure, bronchoscopy showed adenocarcinoma, right mastectomy 02/08/2013 followed by Herceptin maintenance completed 10/14/2013, adjuvant radiation completed 05/17/2013.  Lung adenocarcinoma: CT scans done 10/10/2014 negative for cancer, stable left-sided groundglass pulmonary nodules  Breast Cancer Surveillance: 1. Chest wall exam 04/13/2015: Normal 2. Mammogram: No role of mammogram since she had bilateral mastectomies  Return to clinic in 6 months with another CT chest and follow-up

## 2015-05-02 ENCOUNTER — Other Ambulatory Visit: Payer: Self-pay | Admitting: Hematology and Oncology

## 2015-05-02 DIAGNOSIS — C50111 Malignant neoplasm of central portion of right female breast: Secondary | ICD-10-CM

## 2015-08-15 DIAGNOSIS — H04123 Dry eye syndrome of bilateral lacrimal glands: Secondary | ICD-10-CM | POA: Diagnosis not present

## 2015-08-31 ENCOUNTER — Other Ambulatory Visit: Payer: Self-pay | Admitting: Hematology and Oncology

## 2015-09-13 ENCOUNTER — Other Ambulatory Visit: Payer: Self-pay | Admitting: Cardiology

## 2015-09-17 ENCOUNTER — Ambulatory Visit (INDEPENDENT_AMBULATORY_CARE_PROVIDER_SITE_OTHER): Payer: Medicare Other | Admitting: Cardiology

## 2015-09-17 ENCOUNTER — Encounter: Payer: Self-pay | Admitting: Cardiology

## 2015-09-17 VITALS — BP 118/74 | HR 61 | Ht 61.5 in | Wt 142.8 lb

## 2015-09-17 DIAGNOSIS — Z9010 Acquired absence of unspecified breast and nipple: Secondary | ICD-10-CM

## 2015-09-17 DIAGNOSIS — I471 Supraventricular tachycardia: Secondary | ICD-10-CM

## 2015-09-17 DIAGNOSIS — D689 Coagulation defect, unspecified: Secondary | ICD-10-CM

## 2015-09-17 MED ORDER — METOPROLOL SUCCINATE ER 50 MG PO TB24: ORAL_TABLET | ORAL | Status: DC

## 2015-09-17 NOTE — Progress Notes (Signed)
[facility] [address], [place]  [address] Phone: [phone] Fax:  [phone]  Date:  09/17/2015   ID:  Jessica Wells, DOB [birth year], MRN [id number]  PCP:  Gerrit Heck, MD   History of Present Illness: Jessica Wells is a 69 y.o. female with history of paroxysmal atrial tachycardia, DVT, inflammatory right-sided breast cancer on chronic anticoagulation, here for followup. Her last visit with me was 12/19/10. Was seeing Dr. Haroldine Laws. Toprol working well. Previously on Herceptin.  Had second mastectomy in 6/15. Dr. Donne Hazel.   Gets a mild fullness sensation as though she is going to start tachycardia but no long term symptoms.   She occasionally will have right arm lymphedema. She knows when to start and she is good at wrapping her arms.  Wt Readings from Last 3 Encounters:  09/17/15 142 lb 12.8 oz (64.774 kg)  04/13/15 147 lb (66.679 kg)  10/12/14 152 lb 12.8 oz (69.31 kg)     Past Medical History  Diagnosis Date  . GERD (gastroesophageal reflux disease)   . Allergy   . PAT (paroxysmal atrial tachycardia) (HCC)     hx  . PONV (postoperative nausea and vomiting) 09-13-12    severe, with Port-a-cath, was managed without PONV  . Clotting disorder ([place])     prothrombin gene mutation-heterozygous   . History of chemotherapy     Last dose to be 02-04-13.  Was rx'd with Herceptin & Gemzar  . DVT (deep venous thrombosis) ([facility]) 2014    BLE  . Asthma     Uses Inhalers Proventil as needed  . Bursitis     "both shoulders"  . History of blood transfusion 01-25-13    2 units 01-11-13 ("after chem")  . S/P radiation therapy 4-12 wks ago 03/31/13-05/17/13    right breast/supraclavicular fossa/posterior axillary boost  . Breast cancer ([facility]) 07/14/12    inflammatory right breast ca, ER/PR -  . H/O hiatal hernia   . Dysrhythmia     PAT-sees dr Lina Sayre meds  . Pneumonia 01-25-13    hx. 12-27-12-hospital stay x 9 days, now resolved.  . Pneumonia    "episodic; all my life" (05/10/2014)  . Anemia     "after chemo"  . Arthritis     "knees; fingers; occasionally" (05/10/2014)  . Osteopenia     "lower spine only" (05/10/2014)  . Lymphedema     RUE    Past Surgical History  Procedure Laterality Date  . Dilation and curettage of uterus  1998 X 3  . Tonsillectomy    . Colonoscopy    . Portacath placement  07/21/2012    Procedure: INSERTION PORT-A-CATH;  Surgeon: Rolm Bookbinder, MD;  Location: [facility];  Service: General;  Laterality: Left;/ Replacement done 10'13  . Insertion of vena cava filter  12/2012  . Tumor excision Right 1970    giant cell, off my forearm"  . Chalazion excision Right 1980's    right( MD office)  . Mastectomy modified radical Right 02/08/2013    Procedure: MASTECTOMY MODIFIED RADICAL;  Surgeon: Rolm Bookbinder, MD;  Location: [facility];  Service: General;  Laterality: Right;  . Port-a-cath removal  05/10/2014  . Mastectomy Left 05/10/2014    PROPHYLACTIC   . Mastectomy modified radical Right   . Tubal ligation  ~ 1974  . Breast biopsy Right 2013 X 3  . Simple mastectomy with axillary sentinel node biopsy Left 05/10/2014    Procedure: PROPHYLACTIC LEFT MASTECTOMY;  Surgeon: Rodman Key  Donne Hazel, MD;  Location: [facility];  Service: General;  Laterality: Left;  . Port-a-cath removal N/A 05/10/2014    Procedure: REMOVAL PORT-A-CATH;  Surgeon: Rolm Bookbinder, MD;  Location: [facility];  Service: General;  Laterality: N/A;    .meds Allergies:    Allergies  Allergen Reactions  . Anesthetics, Amide Nausea And Vomiting    Projectile vomitting-Nausea- with 24hrs of dry heaves. With any anesthetics  . Bactrim [Sulfamethoxazole-Trimethoprim] Other (See Comments)    Massive diarrhea, nausea vomiting, platelets dropped, hemoglobin dropped, admitted to hospital  . Sulfa Antibiotics Nausea And Vomiting and Other (See Comments)    Crazy feeling in head Life threatening reaction, decreased blood counts  . Codeine  Nausea And Vomiting  . Codeine Phosphate Nausea And Vomiting  . Eggs Or Egg-Derived Products Hives    Social History:  The patient  reports that she quit smoking about 36 years ago. Her smoking use included Cigarettes. She has a 15 pack-year smoking history. She has never used smokeless tobacco. She reports that she does not drink alcohol or use illicit drugs.   Family History  Problem Relation Age of Onset  . Diabetes Mother   . Heart disease Mother   . Hypertension Mother   . Hyperlipidemia Mother   . Breast cancer Mother 44    lobular breast cancer  . Heart disease Maternal Aunt   . Heart disease Maternal Uncle   . Heart disease Maternal Grandmother     died in her 60s from heart disease  . Lung cancer Father 47    adenocarcinoma  . Cancer Paternal Uncle     dx in mid 53s with a cancer in the leg, died late 41s; grandpaternal half uncle    ROS:  Please see the history of present illness.   No syncope, no excessive palpitations. Recent sinus infection, taking albuterol. No chest pain.   All other systems reviewed and negative.   PHYSICAL EXAM: VS:  BP 118/74 mmHg  Pulse 61  Ht 5' 1.5" (1.562 m)  Wt 142 lb 12.8 oz (64.774 kg)  BMI 26.55 kg/m2 Well nourished, well developed, in no acute distress HEENT: normal, /AT, EOMI Neck: no JVD, normal carotid upstroke, no bruit Cardiac:  normal S1, S2; RRR; no murmur Lungs:  clear to auscultation bilaterally, no wheezing, rhonchi or rales Abd: soft, nontender, no hepatomegaly, no bruits Ext: no edema, 2+ distal pulses Skin: warm and dry GU: deferred Neuro: no focal abnormalities noted, AAO x 3  EKG:  Today 09/17/15-sinus rhythm, 61, no other abnormalities, personally viewed-prior NSR 86.   ECHOS:  07/22/12 EF 60-65% lateral s' 10.5  10/2012 EF 60-65% lateral s' 10.7  01/19/13 EF 55-60% lateral S' 13.0 No evidence of RV strain.  03/21/13 EF 55-60% lateral s' 9.8 (remeasured by me today) Global Strain -16  06/20/13 EF 55-60%  Lateral S' 9.3 Global Strain -24  09/21/13 EF 60% Lateral s' 9.9 Global strain -19%  ASSESSMENT AND PLAN:  1. Supraventricular tachycardia-PAT-doing very well. Continue with Toprol. She is enjoying this. If they become more frequent, consider ablative therapy. We discussed this once again. Overall at this point, she is doing very well and we will not change any medications. 2. Chronic anticoagulation-currently on Xarelto, IVC filter. Positive prothrombin gene mutation, heterozygous. Doing well. No bleeding. 3. Breast cancer-recently saw Dr. Donne Hazel. Doing well. Right-sided. 4. History of pulmonary embolism-IVC filter. 5. We will see back in 12 months.  Signed, Candee Furbish, MD [facility]  09/17/2015 3:40 PM

## 2015-09-17 NOTE — Patient Instructions (Signed)
Medication Instructions:  The current medical regimen is effective;  continue present plan and medications.  Follow-Up: Follow up in 1 year with Dr. Marlou Porch.  You will receive a letter in the mail 2 months before you are due.  Please call us when you receive this letter to schedule your follow up appointment.  Thank you for choosing [facility]!!

## 2015-10-11 ENCOUNTER — Other Ambulatory Visit: Payer: Self-pay | Admitting: *Deleted

## 2015-10-11 DIAGNOSIS — C50111 Malignant neoplasm of central portion of right female breast: Secondary | ICD-10-CM

## 2015-10-15 ENCOUNTER — Other Ambulatory Visit (HOSPITAL_BASED_OUTPATIENT_CLINIC_OR_DEPARTMENT_OTHER): Payer: Medicare Other

## 2015-10-15 ENCOUNTER — Encounter (HOSPITAL_COMMUNITY): Payer: Self-pay

## 2015-10-15 ENCOUNTER — Ambulatory Visit (HOSPITAL_COMMUNITY)
Admission: RE | Admit: 2015-10-15 | Discharge: 2015-10-15 | Disposition: A | Discharge status: Home / Self Care | Payer: Medicare Other | Source: Ambulatory Visit | Attending: Hematology and Oncology | Admitting: Hematology and Oncology

## 2015-10-15 DIAGNOSIS — Z85118 Personal history of other malignant neoplasm of bronchus and lung: Secondary | ICD-10-CM | POA: Diagnosis not present

## 2015-10-15 DIAGNOSIS — Z853 Personal history of malignant neoplasm of breast: Secondary | ICD-10-CM | POA: Diagnosis not present

## 2015-10-15 DIAGNOSIS — C50111 Malignant neoplasm of central portion of right female breast: Secondary | ICD-10-CM

## 2015-10-15 DIAGNOSIS — Z08 Encounter for follow-up examination after completed treatment for malignant neoplasm: Secondary | ICD-10-CM | POA: Insufficient documentation

## 2015-10-15 DIAGNOSIS — C50919 Malignant neoplasm of unspecified site of unspecified female breast: Secondary | ICD-10-CM | POA: Diagnosis not present

## 2015-10-15 DIAGNOSIS — C50911 Malignant neoplasm of unspecified site of right female breast: Secondary | ICD-10-CM | POA: Diagnosis not present

## 2015-10-15 LAB — CBC WITH DIFFERENTIAL/PLATELET
BASO%: 0.9 % (ref 0.0–2.0)
BASOS ABS: 0 10*3/uL (ref 0.0–0.1)
EOS%: 4.2 % (ref 0.0–7.0)
Eosinophils Absolute: 0.2 10*3/uL (ref 0.0–0.5)
HCT: 39.4 % (ref 34.8–46.6)
HEMOGLOBIN: 13.2 g/dL (ref 11.6–15.9)
LYMPH#: 1.4 10*3/uL (ref 0.9–3.3)
LYMPH%: 27.8 % (ref 14.0–49.7)
MCH: 30.3 pg (ref 25.1–34.0)
MCHC: 33.6 g/dL (ref 31.5–36.0)
MCV: 90.1 fL (ref 79.5–101.0)
MONO#: 0.4 10*3/uL (ref 0.1–0.9)
MONO%: 8.8 % (ref 0.0–14.0)
NEUT%: 58.3 % (ref 38.4–76.8)
NEUTROS ABS: 2.9 10*3/uL (ref 1.5–6.5)
Platelets: 179 10*3/uL (ref 145–400)
RBC: 4.37 10*6/uL (ref 3.70–5.45)
RDW: 12.8 % (ref 11.2–14.5)
WBC: 4.9 10*3/uL (ref 3.9–10.3)

## 2015-10-15 LAB — COMPREHENSIVE METABOLIC PANEL (CC13)
ALBUMIN: 4 g/dL (ref 3.5–5.0)
ALK PHOS: 64 U/L (ref 40–150)
ALT: 15 U/L (ref 0–55)
AST: 15 U/L (ref 5–34)
Anion Gap: 10 mEq/L (ref 3–11)
BUN: 24.4 mg/dL (ref 7.0–26.0)
CALCIUM: 9.5 mg/dL (ref 8.4–10.4)
CO2: 27 mEq/L (ref 22–29)
CREATININE: 0.9 mg/dL (ref 0.6–1.1)
Chloride: 104 mEq/L (ref 98–109)
EGFR: 62 mL/min/{1.73_m2} — ABNORMAL LOW (ref >90)
GLUCOSE: 109 mg/dL (ref 70–140)
Potassium: 4.4 mEq/L (ref 3.5–5.1)
Sodium: 141 mEq/L (ref 136–145)
Total Bilirubin: 0.54 mg/dL (ref 0.20–1.20)
Total Protein: 6.7 g/dL (ref 6.4–8.3)

## 2015-10-15 MED ORDER — IOHEXOL 300 MG/ML  SOLN
75.0000 mL | Freq: Once | INTRAMUSCULAR | Status: AC | PRN
  Administered 2015-10-15: 75 mL via INTRAVENOUS

## 2015-10-16 NOTE — Assessment & Plan Note (Addendum)
Right breast inflammation breast cancer diagnosed August 2013 treated with neoadjuvant chemotherapy completed 12/17/2012 hospitalized for bilateral DVT and respiratory failure, bronchoscopy showed adenocarcinoma, right mastectomy 02/08/2013 followed by Herceptin maintenance completed 10/14/2013, adjuvant radiation completed 05/17/2013.  Lung adenocarcinoma: CT scans done 10/15/2015 negative for cancer, stable scattered 3 mm pulmonary nodules  Breast Cancer Surveillance: 1. Chest wall exam 10/17/2015: Normal 2. Mammogram: No role of mammogram since she had bilateral mastectomies  Return to clinic in 1 year alternating with Dr. Donne Hazel.

## 2015-10-17 ENCOUNTER — Ambulatory Visit (HOSPITAL_BASED_OUTPATIENT_CLINIC_OR_DEPARTMENT_OTHER): Payer: Medicare Other | Admitting: Hematology and Oncology

## 2015-10-17 ENCOUNTER — Telehealth: Payer: Self-pay | Admitting: Hematology and Oncology

## 2015-10-17 ENCOUNTER — Encounter: Payer: Self-pay | Admitting: Hematology and Oncology

## 2015-10-17 VITALS — BP 153/71 | HR 71 | Temp 98.1°F | Resp 18 | Ht 61.0 in | Wt 139.9 lb

## 2015-10-17 DIAGNOSIS — C773 Secondary and unspecified malignant neoplasm of axilla and upper limb lymph nodes: Secondary | ICD-10-CM

## 2015-10-17 DIAGNOSIS — C50111 Malignant neoplasm of central portion of right female breast: Secondary | ICD-10-CM

## 2015-10-17 DIAGNOSIS — R911 Solitary pulmonary nodule: Secondary | ICD-10-CM

## 2015-10-17 NOTE — Progress Notes (Signed)
Patient Care Team: Leighton Ruff, MD as PCP - General (Family Medicine)  DIAGNOSIS: No matching staging information was found for the patient.  SUMMARY OF ONCOLOGIC HISTORY:   Cancer of central portion of right female breast ([facility])   07/14/2012 Mammogram And invasive mammary carcinoma with lymphovascular invasion grade 3 ER negative PR negative HER-2 positive inflammatory breast cancer   07/19/2012 Breast MRI Right breast neoplasm level I and right axillary lymph node compatible with lymph node metastases biopsy proven to be invasive mammary cancer   07/23/2012 - 12/17/2012 Neo-Adjuvant Chemotherapy Neoadjuvant FEC 100 followed by Taxol Herceptin due to neuropathy Taxol was replaced by gemcitabine x5 cycles   12/07/2012 -  Hospital Admission Bilateral lower extremity DVT, workup showed prothrombin gene mutation heterozygous and IVC filter, bilateral pulmonary emboli and acute respiratory failure requiring vent support bronchoscopy and BAL concerning for adenocarcinoma, PET/CT negative   02/08/2013 Surgery Right mastectomy showed complete response to chemotherapy in the breast and 11 lymph nodes negative   02/25/2013 - 10/14/2013 Chemotherapy Herceptin maintenance for one year   03/31/2013 - 05/17/2013 Radiation Therapy Adjuvant radiation therapy    05/10/2014 Surgery Left mastectomy    CHIEF COMPLIANT: Follow-up of lung cancer and breast cancer  INTERVAL HISTORY: Jessica Wells is a 69 year old with above-mentioned history of inflammatory breast cancer treated with neoadjuvant chemotherapy followed by mastectomy and Herceptin maintenance followed by adjuvant radiation and subsequently underwent left mastectomy as well. She had a CT scan of the chest recently and is here to discuss those results.  REVIEW OF SYSTEMS:   Constitutional: Denies fevers, chills or abnormal weight loss Eyes: Denies blurriness of vision Ears, nose, mouth, throat, and face: Denies mucositis or sore throat Respiratory:  Denies cough, dyspnea or wheezes Cardiovascular: Denies palpitation, chest discomfort or lower extremity swelling Gastrointestinal:  Denies nausea, heartburn or change in bowel habits Skin: Denies abnormal skin rashes Lymphatics: Denies new lymphadenopathy or easy bruising Neurological:Denies numbness, tingling or new weaknesses Behavioral/Psych: Mood is stable, no new changes  Breast:  denies any pain or lumps or nodules in either chest wall All other systems were reviewed with the patient and are negative.  I have reviewed the past medical history, past surgical history, social history and family history with the patient and they are unchanged from previous note.  ALLERGIES:  is allergic to anesthetics, amide; bactrim; sulfa antibiotics; codeine; codeine phosphate; and eggs or egg-derived products.  MEDICATIONS:  Current Outpatient Prescriptions  Medication Sig Dispense Refill  . B Complex-C (SUPER B COMPLEX PO) Take 1 tablet by mouth daily.    . calcium-vitamin D (OSCAL WITH D) 500-200 MG-UNIT per tablet Take 1 tablet by mouth every other day.     . fish oil-omega-3 fatty acids 1000 MG capsule Take 1 g by mouth daily.     . Flaxseed, Linseed, (FLAX SEEDS PO) Take 1 tablet by mouth daily.    . fluticasone (FLONASE) 50 MCG/ACT nasal spray Place 1 spray into both nostrils as needed for allergies or rhinitis.    Marland Kitchen glucosamine-chondroitin 500-400 MG tablet Take 1 tablet by mouth daily.    . metoprolol succinate (TOPROL-XL) 50 MG 24 hr tablet TAKE ONE TABLET BY MOUTH ONCE DAILY BEFORE BREAKFAST. TAKE  WITH  OR  IMMEDIATELY  FOLLOWING  A  MEAL 90 tablet 3  . Multiple Vitamin (MULTIVITAMIN WITH MINERALS) TABS Take 1 tablet by mouth every other day.     Marland Kitchen omeprazole (PRILOSEC) 20 MG capsule Take 20 mg by mouth daily. For heartburn    .  PROVENTIL HFA 108 (90 BASE) MCG/ACT inhaler Inhale 2 puffs into the lungs every 4 (four) hours as needed.     . vitamin C (ASCORBIC ACID) 500 MG tablet Take 500  mg by mouth daily.    . vitamin E 1000 UNIT capsule Take 1,000 Units by mouth daily.    Alveda Reasons 20 MG TABS tablet TAKE ONE TABLET BY MOUTH ONCE DAILY WITH SUPPER 30 tablet 5   No current facility-administered medications for this visit.    PHYSICAL EXAMINATION: ECOG PERFORMANCE STATUS: 1 - Symptomatic but completely ambulatory  Filed Vitals:   10/17/15 1044  BP: 153/71  Pulse: 71  Temp: 98.1 F (36.7 C)  Resp: 18   Filed Weights   10/17/15 1044  Weight: 139 lb 14.4 oz (63.458 kg)    GENERAL:alert, no distress and comfortable SKIN: skin color, texture, turgor are normal, no rashes or significant lesions EYES: normal, Conjunctiva are pink and non-injected, sclera clear OROPHARYNX:no exudate, no erythema and lips, buccal mucosa, and tongue normal  NECK: supple, thyroid normal size, non-tender, without nodularity LYMPH:  no palpable lymphadenopathy in the cervical, axillary or inguinal LUNGS: clear to auscultation and percussion with normal breathing effort HEART: regular rate & rhythm and no murmurs and no lower extremity edema ABDOMEN:abdomen soft, non-tender and normal bowel sounds Musculoskeletal:no cyanosis of digits and no clubbing  NEURO: alert & oriented x 3 with fluent speech, no focal motor/sensory deficits BREAST: No palpable lumps or nodules in the chest wall. (exam performed in the presence of a chaperone)  LABORATORY DATA:  I have reviewed the data as listed   Chemistry      Component Value Date/Time   NA 141 10/15/2015 0845   NA 141 05/11/2014 0438   K 4.4 10/15/2015 0845   K 4.1 05/11/2014 0438   CL 105 05/11/2014 0438   CL 105 05/20/2013 1459   CO2 27 10/15/2015 0845   CO2 27 05/11/2014 0438   BUN 24.4 10/15/2015 0845   BUN 16 05/11/2014 0438   CREATININE 0.9 10/15/2015 0845   CREATININE 0.84 05/11/2014 0438      Component Value Date/Time   CALCIUM 9.5 10/15/2015 0845   CALCIUM 8.6 05/11/2014 0438   ALKPHOS 64 10/15/2015 0845   ALKPHOS 66  01/28/2013 1630   AST 15 10/15/2015 0845   AST 36 01/28/2013 1630   ALT 15 10/15/2015 0845   ALT 55* 01/28/2013 1630   BILITOT 0.54 10/15/2015 0845   BILITOT 0.2* 01/28/2013 1630       Lab Results  Component Value Date   WBC 4.9 10/15/2015   HGB 13.2 10/15/2015   HCT 39.4 10/15/2015   MCV 90.1 10/15/2015   PLT 179 10/15/2015   NEUTROABS 2.9 10/15/2015   ASSESSMENT & PLAN:  Cancer of central portion of right female breast Right breast inflammation breast cancer diagnosed August 2013 treated with neoadjuvant chemotherapy completed 12/17/2012 hospitalized for bilateral DVT and respiratory failure, bronchoscopy showed adenocarcinoma, right mastectomy 02/08/2013 followed by Herceptin maintenance completed 10/14/2013, adjuvant radiation completed 05/17/2013.  Lung adenocarcinoma: CT scans done 10/15/2015 negative for cancer, stable scattered 3 mm pulmonary nodules  Breast Cancer Surveillance: 1. Chest wall exam 10/17/2015: Normal 2. Mammogram: No role of mammogram since she had bilateral mastectomies  Return to clinic in 1 year.   Orders Placed This Encounter  Procedures  . CT Chest W Contrast    Standing Status: Future     Number of Occurrences:      Standing Expiration Date: 10/16/2016  Order Specific Question:  If indicated for the ordered procedure, I authorize the administration of contrast media per Radiology protocol    Answer:  Yes    Order Specific Question:  Reason for Exam (SYMPTOM  OR DIAGNOSIS REQUIRED)    Answer:  Lung cancer restaging    Order Specific Question:  Preferred imaging location?    Answer:  [facility]  . CBC with Differential    Standing Status: Future     Number of Occurrences:      Standing Expiration Date: 10/16/2016  . Comprehensive metabolic panel (Cmet) - [facility]    Standing Status: Future     Number of Occurrences:      Standing Expiration Date: 10/16/2016   The patient has a good understanding of the overall plan. she agrees  with it. she will call with any problems that may develop before the next visit here.   Rulon Eisenmenger, MD 10/17/2015

## 2015-10-17 NOTE — Addendum Note (Signed)
Addended by: Prentiss Bells on: 10/17/2015 05:36 PM   Modules accepted: Medications

## 2015-11-06 DIAGNOSIS — M8588 Other specified disorders of bone density and structure, other site: Secondary | ICD-10-CM | POA: Diagnosis not present

## 2015-12-06 DIAGNOSIS — M8580 Other specified disorders of bone density and structure, unspecified site: Secondary | ICD-10-CM | POA: Diagnosis not present

## 2015-12-06 DIAGNOSIS — Z853 Personal history of malignant neoplasm of breast: Secondary | ICD-10-CM | POA: Diagnosis not present

## 2015-12-06 DIAGNOSIS — Z9013 Acquired absence of bilateral breasts and nipples: Secondary | ICD-10-CM | POA: Diagnosis not present

## 2015-12-06 DIAGNOSIS — E119 Type 2 diabetes mellitus without complications: Secondary | ICD-10-CM | POA: Diagnosis not present

## 2015-12-06 DIAGNOSIS — E785 Hyperlipidemia, unspecified: Secondary | ICD-10-CM | POA: Diagnosis not present

## 2015-12-06 DIAGNOSIS — Z86711 Personal history of pulmonary embolism: Secondary | ICD-10-CM | POA: Diagnosis not present

## 2015-12-06 DIAGNOSIS — Z1389 Encounter for screening for other disorder: Secondary | ICD-10-CM | POA: Diagnosis not present

## 2016-02-01 ENCOUNTER — Telehealth: Payer: Self-pay | Admitting: *Deleted

## 2016-02-01 ENCOUNTER — Encounter: Payer: Self-pay | Admitting: Hematology and Oncology

## 2016-02-01 ENCOUNTER — Telehealth: Payer: Self-pay

## 2016-02-01 NOTE — Progress Notes (Signed)
Sent req for xarelto prior auth

## 2016-02-01 NOTE — Telephone Encounter (Signed)
Patient called regarding her medication xarelto, she states that the pharmacy won't refill the prescription without hearing from Dr. Geralyn Flash office.  VM sent to MD's desk nurse.

## 2016-02-01 NOTE — Telephone Encounter (Signed)
Patient called wanting a refill on her Xarelto and stated that her pharmacy would not fill it. Verified with pharmacy that medication needs pre auth. Sent in basket to [facility].

## 2016-02-04 ENCOUNTER — Encounter: Payer: Self-pay | Admitting: Hematology and Oncology

## 2016-02-04 NOTE — Progress Notes (Signed)
Per [facility] xarelto approved thru 11/30/16  [id number].sent to medical records and noted share point

## 2016-02-05 ENCOUNTER — Other Ambulatory Visit: Payer: Self-pay | Admitting: *Deleted

## 2016-02-05 DIAGNOSIS — C50111 Malignant neoplasm of central portion of right female breast: Secondary | ICD-10-CM

## 2016-02-05 MED ORDER — RIVAROXABAN 20 MG PO TABS
ORAL_TABLET | ORAL | Status: DC
Start: 2016-02-05 — End: 2016-09-30

## 2016-02-18 DIAGNOSIS — C50911 Malignant neoplasm of unspecified site of right female breast: Secondary | ICD-10-CM | POA: Diagnosis not present

## 2016-03-27 DIAGNOSIS — Z79899 Other long term (current) drug therapy: Secondary | ICD-10-CM | POA: Diagnosis not present

## 2016-03-27 DIAGNOSIS — E7800 Pure hypercholesterolemia, unspecified: Secondary | ICD-10-CM | POA: Diagnosis not present

## 2016-03-27 DIAGNOSIS — E119 Type 2 diabetes mellitus without complications: Secondary | ICD-10-CM | POA: Diagnosis not present

## 2016-06-20 ENCOUNTER — Telehealth: Payer: Self-pay | Admitting: Hematology and Oncology

## 2016-06-20 NOTE — Telephone Encounter (Signed)
Spoke with patient. Appointment confirmed. Jessica F. °

## 2016-08-20 DIAGNOSIS — E119 Type 2 diabetes mellitus without complications: Secondary | ICD-10-CM | POA: Diagnosis not present

## 2016-09-26 DIAGNOSIS — Z853 Personal history of malignant neoplasm of breast: Secondary | ICD-10-CM | POA: Diagnosis not present

## 2016-09-26 DIAGNOSIS — M25569 Pain in unspecified knee: Secondary | ICD-10-CM | POA: Diagnosis not present

## 2016-09-26 DIAGNOSIS — I471 Supraventricular tachycardia: Secondary | ICD-10-CM | POA: Diagnosis not present

## 2016-09-26 DIAGNOSIS — E7800 Pure hypercholesterolemia, unspecified: Secondary | ICD-10-CM | POA: Diagnosis not present

## 2016-09-26 DIAGNOSIS — Z9013 Acquired absence of bilateral breasts and nipples: Secondary | ICD-10-CM | POA: Diagnosis not present

## 2016-09-26 DIAGNOSIS — Z86711 Personal history of pulmonary embolism: Secondary | ICD-10-CM | POA: Diagnosis not present

## 2016-09-26 DIAGNOSIS — E119 Type 2 diabetes mellitus without complications: Secondary | ICD-10-CM | POA: Diagnosis not present

## 2016-09-30 ENCOUNTER — Other Ambulatory Visit: Payer: Self-pay | Admitting: Hematology and Oncology

## 2016-09-30 DIAGNOSIS — C50111 Malignant neoplasm of central portion of right female breast: Secondary | ICD-10-CM

## 2016-10-04 ENCOUNTER — Other Ambulatory Visit: Payer: Self-pay | Admitting: Cardiology

## 2016-10-09 ENCOUNTER — Other Ambulatory Visit: Payer: Medicare Other

## 2016-10-10 ENCOUNTER — Other Ambulatory Visit (HOSPITAL_BASED_OUTPATIENT_CLINIC_OR_DEPARTMENT_OTHER): Payer: Medicare Other

## 2016-10-10 DIAGNOSIS — C50111 Malignant neoplasm of central portion of right female breast: Secondary | ICD-10-CM | POA: Diagnosis present

## 2016-10-10 DIAGNOSIS — C773 Secondary and unspecified malignant neoplasm of axilla and upper limb lymph nodes: Secondary | ICD-10-CM

## 2016-10-10 LAB — COMPREHENSIVE METABOLIC PANEL
ALBUMIN: 3.9 g/dL (ref 3.5–5.0)
ALK PHOS: 65 U/L (ref 40–150)
ALT: 20 U/L (ref 0–55)
AST: 18 U/L (ref 5–34)
Anion Gap: 9 mEq/L (ref 3–11)
BILIRUBIN TOTAL: 0.59 mg/dL (ref 0.20–1.20)
BUN: 28.3 mg/dL — AB (ref 7.0–26.0)
CALCIUM: 9.5 mg/dL (ref 8.4–10.4)
CO2: 27 mEq/L (ref 22–29)
CREATININE: 1 mg/dL (ref 0.6–1.1)
Chloride: 103 mEq/L (ref 98–109)
EGFR: 59 mL/min/{1.73_m2} — ABNORMAL LOW (ref >90)
GLUCOSE: 124 mg/dL (ref 70–140)
POTASSIUM: 4.6 meq/L (ref 3.5–5.1)
SODIUM: 139 meq/L (ref 136–145)
TOTAL PROTEIN: 7 g/dL (ref 6.4–8.3)

## 2016-10-10 LAB — CBC WITH DIFFERENTIAL/PLATELET
BASO%: 0.6 % (ref 0.0–2.0)
BASOS ABS: 0 10*3/uL (ref 0.0–0.1)
EOS ABS: 0.6 10*3/uL — AB (ref 0.0–0.5)
EOS%: 10.1 % — AB (ref 0.0–7.0)
HEMATOCRIT: 40 % (ref 34.8–46.6)
HEMOGLOBIN: 13.5 g/dL (ref 11.6–15.9)
LYMPH#: 2 10*3/uL (ref 0.9–3.3)
LYMPH%: 31.1 % (ref 14.0–49.7)
MCH: 30.7 pg (ref 25.1–34.0)
MCHC: 33.8 g/dL (ref 31.5–36.0)
MCV: 90.9 fL (ref 79.5–101.0)
MONO#: 0.5 10*3/uL (ref 0.1–0.9)
MONO%: 8.1 % (ref 0.0–14.0)
NEUT%: 50.1 % (ref 38.4–76.8)
NEUTROS ABS: 3.2 10*3/uL (ref 1.5–6.5)
Platelets: 148 10*3/uL (ref 145–400)
RBC: 4.4 10*6/uL (ref 3.70–5.45)
RDW: 12.5 % (ref 11.2–14.5)
WBC: 6.3 10*3/uL (ref 3.9–10.3)

## 2016-10-13 ENCOUNTER — Encounter (HOSPITAL_COMMUNITY): Payer: Self-pay

## 2016-10-13 ENCOUNTER — Ambulatory Visit (HOSPITAL_COMMUNITY)
Admission: RE | Admit: 2016-10-13 | Discharge: 2016-10-13 | Disposition: A | Discharge status: Home / Self Care | Payer: Medicare Other | Source: Ambulatory Visit | Attending: Hematology and Oncology | Admitting: Hematology and Oncology

## 2016-10-13 ENCOUNTER — Other Ambulatory Visit: Payer: Medicare Other

## 2016-10-13 DIAGNOSIS — C50111 Malignant neoplasm of central portion of right female breast: Secondary | ICD-10-CM | POA: Insufficient documentation

## 2016-10-13 DIAGNOSIS — R918 Other nonspecific abnormal finding of lung field: Secondary | ICD-10-CM | POA: Diagnosis not present

## 2016-10-13 MED ORDER — IOPAMIDOL (ISOVUE-300) INJECTION 61%
75.0000 mL | Freq: Once | INTRAVENOUS | Status: AC | PRN
  Administered 2016-10-13: 75 mL via INTRAVENOUS

## 2016-10-16 ENCOUNTER — Ambulatory Visit: Payer: Medicare Other | Admitting: Hematology and Oncology

## 2016-10-16 ENCOUNTER — Ambulatory Visit (INDEPENDENT_AMBULATORY_CARE_PROVIDER_SITE_OTHER): Payer: Medicare Other | Admitting: Cardiology

## 2016-10-16 ENCOUNTER — Encounter: Payer: Self-pay | Admitting: Cardiology

## 2016-10-16 VITALS — BP 116/78 | HR 75 | Ht 61.0 in | Wt 138.8 lb

## 2016-10-16 DIAGNOSIS — D689 Coagulation defect, unspecified: Secondary | ICD-10-CM

## 2016-10-16 DIAGNOSIS — I471 Supraventricular tachycardia: Secondary | ICD-10-CM | POA: Diagnosis not present

## 2016-10-16 DIAGNOSIS — Z9010 Acquired absence of unspecified breast and nipple: Secondary | ICD-10-CM | POA: Diagnosis not present

## 2016-10-16 NOTE — Patient Instructions (Signed)

## 2016-10-16 NOTE — Progress Notes (Signed)
Jessica Wells. [address] [place], [place]  [address] Phone: [phone] Fax:  [phone]  Date:  10/16/2016   ID:  Jessica Wells, DOB [birth year], MRN [id number]  PCP:  Jessica Heck, MD   History of Present Illness: Jessica Wells is a 70 y.o. female with history of paroxysmal atrial tachycardia, DVT, inflammatory right-sided breast cancer on chronic anticoagulation, here for followup. Her last visit with me was 12/19/10. Was seeing Dr. Haroldine Wells. Toprol working well. Previously on Herceptin.  Had second mastectomy in 6/15. Dr. Donne Wells.   Gets a mild fullness sensation as though she is going to start tachycardia but no long term symptoms.  2 skips only. Does not take off.   She occasionally will have right arm lymphedema. She knows when to start and she is good at wrapping her arms. 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  Wt Readings from Last 3 Encounters:  10/16/16 138 lb 12.8 oz (63 kg)  10/17/15 139 lb 14.4 oz (63.5 kg)  09/17/15 142 lb 12.8 oz (64.8 kg)     Past Medical History:  Diagnosis Date  . Allergy   . Anemia    "after chemo"  . Arthritis    "knees; fingers; occasionally" (05/10/2014)  . Asthma    Uses Inhalers Proventil as needed  . Breast cancer ([place]) 07/14/12   inflammatory right breast ca, ER/PR -  . Bursitis    "both shoulders"  . Clotting disorder ([place])    prothrombin gene mutation-heterozygous   . DVT (deep venous thrombosis) ([place]) 2014   BLE  . Dysrhythmia    PAT-sees dr Jessica Wells meds  . GERD (gastroesophageal reflux disease)   . H/O hiatal hernia   . History of blood transfusion 01-25-13   2 units 01-11-13 ("after chem")  . History of chemotherapy    Last dose to be 02-04-13.  Was rx'd with Herceptin & Gemzar  . Lymphedema    RUE  . Osteopenia    "lower spine only" (05/10/2014)  . PAT (paroxysmal atrial  tachycardia) (HCC)    hx  . Pneumonia 01-25-13   hx. 12-27-12-hospital stay x 9 days, now resolved.  . Pneumonia    "episodic; all my life" (05/10/2014)  . PONV (postoperative nausea and vomiting) 09-13-12   severe, with Port-a-cath, was managed without PONV  . S/P radiation therapy 4-12 wks ago 03/31/13-05/17/13   right breast/supraclavicular fossa/posterior axillary boost    Past Surgical History:  Procedure Laterality Date  . BREAST BIOPSY Right 2013 X 3  . CHALAZION EXCISION Right 1980's   right( MD office)  . COLONOSCOPY    . DILATION AND CURETTAGE OF UTERUS  1998 X 3  . INSERTION OF VENA CAVA FILTER  12/2012  . MASTECTOMY Left 05/10/2014   PROPHYLACTIC   . MASTECTOMY MODIFIED RADICAL Right 02/08/2013   Procedure: MASTECTOMY MODIFIED RADICAL;  Surgeon: Jessica Bookbinder, MD;  Location: [facility];  Service: General;  Laterality: Right;  . MASTECTOMY MODIFIED RADICAL Right   . PORT-A-CATH REMOVAL  05/10/2014  . PORT-A-CATH REMOVAL N/A 05/10/2014   Procedure: REMOVAL PORT-A-CATH;  Surgeon: Jessica Bookbinder, MD;  Location: [facility];  Service: General;  Laterality: N/A;  . PORTACATH PLACEMENT  07/21/2012   Procedure: INSERTION PORT-A-CATH;  Surgeon: Jessica Bookbinder, MD;  Location: [facility];  Service: General;  Laterality: Left;/ Replacement done 10'13  .  SIMPLE MASTECTOMY WITH AXILLARY SENTINEL NODE BIOPSY Left 05/10/2014   Procedure: PROPHYLACTIC LEFT MASTECTOMY;  Surgeon: Jessica Bookbinder, MD;  Location: [facility];  Service: General;  Laterality: Left;  . TONSILLECTOMY    . TUBAL LIGATION  ~ 1974  . TUMOR EXCISION Right 1970   giant cell, off my forearm"   Xarelto 20 QD Toprol '50mg'$  Atorvastatin 10 QD Lisinopril 2.5 QD  Allergies:    Allergies  Allergen Reactions  . Anesthetics, Amide Nausea And Vomiting    Projectile vomitting-Nausea- with 24hrs of dry heaves. With any anesthetics  . Bactrim [Sulfamethoxazole-Trimethoprim] Other (See Comments)    Massive diarrhea,  nausea vomiting, platelets dropped, hemoglobin dropped, admitted to hospital  . Sulfa Antibiotics Nausea And Vomiting and Other (See Comments)    Crazy feeling in head Life threatening reaction, decreased blood counts  . Codeine Nausea And Vomiting  . Codeine Phosphate Nausea And Vomiting  . Eggs Or Egg-Derived Products Hives    Social History:  The patient  reports that she quit smoking about 37 years ago. Her smoking use included Cigarettes. She has a 15.00 pack-year smoking history. She has never used smokeless tobacco. She reports that she does not drink alcohol or use drugs.   Family History  Problem Relation Age of Onset  . Diabetes Mother   . Heart disease Mother   . Hypertension Mother   . Hyperlipidemia Mother   . Breast cancer Mother 39    lobular breast cancer  . Heart disease Maternal Aunt   . Heart disease Maternal Uncle   . Heart disease Maternal Grandmother     died in her 75s from heart disease  . Lung cancer Father 53    adenocarcinoma  . Cancer Paternal Uncle     dx in mid 7s with a cancer in the leg, died late 58s; grandpaternal half uncle    ROS:  Please see the history of present illness.   No syncope, no excessive palpitations. Recent sinus infection, taking albuterol. No chest pain.   All other systems reviewed and negative.   PHYSICAL EXAM: VS:  BP 116/78   Pulse 75   Ht '5\' 1"'$  (1.549 m)   Wt 138 lb 12.8 oz (63 kg)   LMP  (LMP Unknown)   BMI 26.23 kg/m  Well nourished, well developed, in no acute distress  HEENT: normal, Jessica Wells/AT, EOMI Neck: no JVD, normal carotid upstroke, no bruit Cardiac:  normal S1, S2; RRR; no murmur  Lungs:  clear to auscultation bilaterally, no wheezing, rhonchi or rales  Abd: soft, nontender, no hepatomegaly, no bruits  Ext: no edema, 2+ distal pulses Skin: warm and dry  GU: deferred Neuro: no focal abnormalities noted, AAO x 3  EKG:  Today 10/16/16-sinus rhythm, 75, no other abnormalities 09/17/15-sinus rhythm, 61, no  other abnormalities, personally viewed-prior NSR 86.   ECHOS:  07/22/12 EF 60-65% lateral s' 10.5  10/2012 EF 60-65% lateral s' 10.7  01/19/13 EF 55-60% lateral S' 13.0 No evidence of RV strain.  03/21/13 EF 55-60% lateral s' 9.8 (remeasured by me today) Global Strain -16  06/20/13 EF 55-60% Lateral S' 9.3 Global Strain -24  09/21/13 EF 60% Lateral s' 9.9 Global strain -19%  ASSESSMENT AND PLAN:  1. Supraventricular tachycardia-PAT-doing very well. Continue with Toprol 50. She is enjoying this regimen. If they become more frequent, consider ablative therapy. We discussed this once again. Overall at this point, she is doing very well and we will not change any medications. Very low-dose lisinopril.  2. Chronic anticoagulation-currently on Xarelto, IVC filter. Positive prothrombin gene mutation, heterozygous. Doing well. No bleeding. 3. Breast cancer-recently saw Dr. Donne Wells. Doing well. Right-sided. 4. History of pulmonary embolism-IVC filter. 5. Hyperlipidemia-atorvastatin 10 mg. Doing well. Dr. Drema Dallas has been monitoring. 6. We will see back in 12 months.  Signed, Candee Furbish, MD [facility]  10/16/2016 8:58 AM

## 2016-10-17 ENCOUNTER — Ambulatory Visit (HOSPITAL_BASED_OUTPATIENT_CLINIC_OR_DEPARTMENT_OTHER): Payer: Medicare Other | Admitting: Hematology and Oncology

## 2016-10-17 ENCOUNTER — Encounter: Payer: Self-pay | Admitting: Hematology and Oncology

## 2016-10-17 ENCOUNTER — Other Ambulatory Visit: Payer: Medicare Other

## 2016-10-17 VITALS — BP 123/77 | HR 90 | Temp 98.0°F | Resp 18 | Ht 61.0 in | Wt 137.9 lb

## 2016-10-17 DIAGNOSIS — Z171 Estrogen receptor negative status [ER-]: Secondary | ICD-10-CM

## 2016-10-17 DIAGNOSIS — C3401 Malignant neoplasm of right main bronchus: Secondary | ICD-10-CM

## 2016-10-17 DIAGNOSIS — I2699 Other pulmonary embolism without acute cor pulmonale: Secondary | ICD-10-CM

## 2016-10-17 DIAGNOSIS — C3490 Malignant neoplasm of unspecified part of unspecified bronchus or lung: Secondary | ICD-10-CM | POA: Diagnosis not present

## 2016-10-17 DIAGNOSIS — I824Z3 Acute embolism and thrombosis of unspecified deep veins of distal lower extremity, bilateral: Secondary | ICD-10-CM

## 2016-10-17 DIAGNOSIS — Z853 Personal history of malignant neoplasm of breast: Secondary | ICD-10-CM

## 2016-10-17 DIAGNOSIS — C50111 Malignant neoplasm of central portion of right female breast: Secondary | ICD-10-CM

## 2016-10-17 NOTE — Progress Notes (Signed)
Patient Care Team: Leighton Ruff, MD as PCP - General (Family Medicine)  DIAGNOSIS:  Encounter Diagnoses  Name Primary?  . Malignant neoplasm of central portion of right breast in female, estrogen receptor negative ([place])   . Deep vein thrombosis (DVT) of distal vein of both lower extremities, unspecified chronicity ([place]) Yes  . Other pulmonary embolism without acute cor pulmonale, unspecified chronicity ([place])     SUMMARY OF ONCOLOGIC HISTORY:   Cancer of central portion of right female breast ([place])   07/14/2012 Mammogram    And invasive mammary carcinoma with lymphovascular invasion grade 3 ER negative PR negative HER-2 positive inflammatory breast cancer      07/19/2012 Breast MRI    Right breast neoplasm level I and right axillary lymph node compatible with lymph node metastases biopsy proven to be invasive mammary cancer      07/23/2012 - 12/17/2012 Neo-Adjuvant Chemotherapy    Neoadjuvant FEC 100 followed by Taxol Herceptin due to neuropathy Taxol was replaced by gemcitabine x5 cycles      12/07/2012 -  Hospital Admission    Bilateral lower extremity DVT, workup showed prothrombin gene mutation heterozygous and IVC filter, bilateral pulmonary emboli and acute respiratory failure requiring vent support bronchoscopy and BAL concerning for adenocarcinoma, PET/CT negative      02/08/2013 Surgery    Right mastectomy showed complete response to chemotherapy in the breast and 11 lymph nodes negative      02/25/2013 - 10/14/2013 Chemotherapy    Herceptin maintenance for one year      03/31/2013 - 05/17/2013 Radiation Therapy    Adjuvant radiation therapy       05/10/2014 Surgery    Left mastectomy       CHIEF COMPLIANT: Follow-up of breast cancer and lung cancer  INTERVAL HISTORY: Jessica Wells is a 70 year old with above-mentioned history of right breast cancer who received neoadjuvant chemotherapy followed by mastectomy. She finished one year of Herceptin maintenance  followed by radiation and is on surveillance. She also had a left mastectomy in 2015. She was diagnosed with adenocarcinoma the lung during a routine bronchoscopy and there was never a lung nodule. She has not received any lung cancer treatment. We are watching her lungs extremely closely with annual CT scans. Most recent CT scans were stable. She denies any other lung symptoms.  REVIEW OF SYSTEMS:   Constitutional: Denies fevers, chills or abnormal weight loss Eyes: Denies blurriness of vision Ears, nose, mouth, throat, and face: Denies mucositis or sore throat Respiratory: Denies cough, dyspnea or wheezes Cardiovascular: Denies palpitation, chest discomfort Gastrointestinal:  Denies nausea, heartburn or change in bowel habits Skin: Denies abnormal skin rashes Lymphatics: Denies new lymphadenopathy or easy bruising Neurological:Denies numbness, tingling or new weaknesses Behavioral/Psych: Mood is stable, no new changes  Extremities: No lower extremity edema Breast:  denies any pain or lumps or nodules in either breasts All other systems were reviewed with the patient and are negative.  I have reviewed the past medical history, past surgical history, social history and family history with the patient and they are unchanged from previous note.  ALLERGIES:  is allergic to anesthetics, amide; bactrim [sulfamethoxazole-trimethoprim]; sulfa antibiotics; codeine; codeine phosphate; and eggs or egg-derived products.  MEDICATIONS:  Current Outpatient Prescriptions  Medication Sig Dispense Refill  . atorvastatin (LIPITOR) 10 MG tablet Take 10 mg by mouth daily.    . B Complex-C (SUPER B COMPLEX PO) Take 1 tablet by mouth daily.    . calcium-vitamin D (OSCAL WITH D) 500-200 MG-UNIT  per tablet Take 1 tablet by mouth every other day.     . fish oil-omega-3 fatty acids 1000 MG capsule Take 1 g by mouth daily.     . Flaxseed, Linseed, (FLAX SEEDS PO) Take 1 tablet by mouth daily.    . fluticasone  (FLONASE) 50 MCG/ACT nasal spray Place 1 spray into both nostrils as needed for allergies or rhinitis.    Marland Kitchen glucosamine-chondroitin 500-400 MG tablet Take 1 tablet by mouth daily.    Marland Kitchen lisinopril (PRINIVIL,ZESTRIL) 2.5 MG tablet Take 2.5 mg by mouth daily.    . metoprolol succinate (TOPROL-XL) 50 MG 24 hr tablet TAKE ONE TABLET BY MOUTH ONCE DAILY BEFORE BREAKFAST. TAKE WITH OR IMMEDIATELY FOLLOWING A MEAL 90 tablet 0  . Multiple Vitamin (MULTIVITAMIN WITH MINERALS) TABS Take 1 tablet by mouth every other day.     Marland Kitchen omeprazole (PRILOSEC) 20 MG capsule Take 20 mg by mouth daily. For heartburn    . PROVENTIL HFA 108 (90 BASE) MCG/ACT inhaler Inhale 2 puffs into the lungs every 4 (four) hours as needed.     . vitamin C (ASCORBIC ACID) 500 MG tablet Take 500 mg by mouth daily.    . vitamin E 1000 UNIT capsule Take 1,000 Units by mouth daily.    Alveda Reasons 20 MG TABS tablet TAKE ONE TABLET BY MOUTH ONCE DAILY WITH  SUPPER 30 tablet 2   No current facility-administered medications for this visit.     PHYSICAL EXAMINATION: ECOG PERFORMANCE STATUS: 0 - Asymptomatic  Vitals:   10/17/16 1101  BP: 123/77  Pulse: 90  Resp: 18  Temp: 98 F (36.7 C)   Filed Weights   10/17/16 1101  Weight: 137 lb 14.4 oz (62.6 kg)    GENERAL:alert, no distress and comfortable SKIN: skin color, texture, turgor are normal, no rashes or significant lesions EYES: normal, Conjunctiva are pink and non-injected, sclera clear OROPHARYNX:no exudate, no erythema and lips, buccal mucosa, and tongue normal  NECK: supple, thyroid normal size, non-tender, without nodularity LYMPH:  no palpable lymphadenopathy in the cervical, axillary or inguinal LUNGS: clear to auscultation and percussion with normal breathing effort HEART: regular rate & rhythm and no murmurs and no lower extremity edema ABDOMEN:abdomen soft, non-tender and normal bowel sounds MUSCULOSKELETAL:no cyanosis of digits and no clubbing  NEURO: alert &  oriented x 3 with fluent speech, no focal motor/sensory deficits EXTREMITIES: No lower extremity edema BREAST:Bilateral mastectomies (exam performed in the presence of a chaperone)  LABORATORY DATA:  I have reviewed the data as listed   Chemistry      Component Value Date/Time   NA 139 10/10/2016 0906   K 4.6 10/10/2016 0906   CL 105 05/11/2014 0438   CL 105 05/20/2013 1459   CO2 27 10/10/2016 0906   BUN 28.3 (H) 10/10/2016 0906   CREATININE 1.0 10/10/2016 0906      Component Value Date/Time   CALCIUM 9.5 10/10/2016 0906   ALKPHOS 65 10/10/2016 0906   AST 18 10/10/2016 0906   ALT 20 10/10/2016 0906   BILITOT 0.59 10/10/2016 0906       Lab Results  Component Value Date   WBC 6.3 10/10/2016   HGB 13.5 10/10/2016   HCT 40.0 10/10/2016   MCV 90.9 10/10/2016   PLT 148 10/10/2016   NEUTROABS 3.2 10/10/2016     ASSESSMENT & PLAN:  Cancer of central portion of right female breast Right breast inflammation breast cancer diagnosed August 2013 treated with neoadjuvant chemotherapy completed 12/17/2012 hospitalized  for bilateral DVT and respiratory failure, bronchoscopy showed adenocarcinoma, right mastectomy 02/08/2013 followed by Herceptin maintenance completed 10/14/2013, adjuvant radiation completed 05/17/2013.  Lung adenocarcinoma: CT scans done 10/13/2016 negative for cancer, stable scattered 3 mm pulmonary nodules, unchanged  Breast Cancer Surveillance: 1. Chest wall exam 10/18/2015: Normal 2. Mammogram: No role of mammogram since she had bilateral mastectomies  Return to clinic in 1 year.   No orders of the defined types were placed in this encounter.  The patient has a good understanding of the overall plan. she agrees with it. she will call with any problems that may develop before the next visit here.   Rulon Eisenmenger, MD 10/17/16

## 2016-10-17 NOTE — Assessment & Plan Note (Signed)
Right breast inflammation breast cancer diagnosed August 2013 treated with neoadjuvant chemotherapy completed 12/17/2012 hospitalized for bilateral DVT and respiratory failure, bronchoscopy showed adenocarcinoma, right mastectomy 02/08/2013 followed by Herceptin maintenance completed 10/14/2013, adjuvant radiation completed 05/17/2013.  Lung adenocarcinoma: CT scans done 10/13/2016 negative for cancer, stable scattered 3 mm pulmonary nodules, unchanged  Breast Cancer Surveillance: 1. Chest wall exam 10/18/2015: Normal 2. Mammogram: No role of mammogram since she had bilateral mastectomies  Return to clinic in 1 year.

## 2016-10-21 DIAGNOSIS — M7632 Iliotibial band syndrome, left leg: Secondary | ICD-10-CM | POA: Diagnosis not present

## 2016-10-21 DIAGNOSIS — M1711 Unilateral primary osteoarthritis, right knee: Secondary | ICD-10-CM | POA: Diagnosis not present

## 2016-10-21 DIAGNOSIS — M1712 Unilateral primary osteoarthritis, left knee: Secondary | ICD-10-CM | POA: Diagnosis not present

## 2016-10-31 DIAGNOSIS — M1712 Unilateral primary osteoarthritis, left knee: Secondary | ICD-10-CM | POA: Diagnosis not present

## 2016-11-05 DIAGNOSIS — M1712 Unilateral primary osteoarthritis, left knee: Secondary | ICD-10-CM | POA: Diagnosis not present

## 2016-11-07 DIAGNOSIS — M1712 Unilateral primary osteoarthritis, left knee: Secondary | ICD-10-CM | POA: Diagnosis not present

## 2016-11-11 DIAGNOSIS — M1712 Unilateral primary osteoarthritis, left knee: Secondary | ICD-10-CM | POA: Diagnosis not present

## 2016-11-14 DIAGNOSIS — M1712 Unilateral primary osteoarthritis, left knee: Secondary | ICD-10-CM | POA: Diagnosis not present

## 2016-11-18 DIAGNOSIS — M1712 Unilateral primary osteoarthritis, left knee: Secondary | ICD-10-CM | POA: Diagnosis not present

## 2016-11-20 DIAGNOSIS — M1712 Unilateral primary osteoarthritis, left knee: Secondary | ICD-10-CM | POA: Diagnosis not present

## 2016-11-26 DIAGNOSIS — M1712 Unilateral primary osteoarthritis, left knee: Secondary | ICD-10-CM | POA: Diagnosis not present

## 2016-11-28 DIAGNOSIS — M1712 Unilateral primary osteoarthritis, left knee: Secondary | ICD-10-CM | POA: Diagnosis not present

## 2016-12-08 ENCOUNTER — Other Ambulatory Visit: Payer: Self-pay | Admitting: Nurse Practitioner

## 2016-12-29 ENCOUNTER — Other Ambulatory Visit: Payer: Self-pay | Admitting: Hematology and Oncology

## 2016-12-29 DIAGNOSIS — C50111 Malignant neoplasm of central portion of right female breast: Secondary | ICD-10-CM

## 2017-01-06 ENCOUNTER — Other Ambulatory Visit: Payer: Self-pay | Admitting: *Deleted

## 2017-01-06 MED ORDER — METOPROLOL SUCCINATE ER 50 MG PO TB24: ORAL_TABLET | ORAL | 2 refills | Status: DC

## 2017-02-12 ENCOUNTER — Ambulatory Visit (INDEPENDENT_AMBULATORY_CARE_PROVIDER_SITE_OTHER): Payer: Medicare Other | Admitting: Allergy & Immunology

## 2017-02-12 ENCOUNTER — Encounter: Payer: Self-pay | Admitting: Allergy & Immunology

## 2017-02-12 VITALS — BP 120/78 | HR 90 | Temp 98.3°F | Resp 16 | Ht 60.0 in | Wt 139.8 lb

## 2017-02-12 DIAGNOSIS — J3089 Other allergic rhinitis: Secondary | ICD-10-CM | POA: Diagnosis not present

## 2017-02-12 DIAGNOSIS — T781XXD Other adverse food reactions, not elsewhere classified, subsequent encounter: Secondary | ICD-10-CM

## 2017-02-12 DIAGNOSIS — J4530 Mild persistent asthma, uncomplicated: Secondary | ICD-10-CM

## 2017-02-12 MED ORDER — MONTELUKAST SODIUM 10 MG PO TABS: 10.0000 mg | ORAL_TABLET | Freq: Every day | ORAL | 5 refills | Status: DC

## 2017-02-12 MED ORDER — BUDESONIDE-FORMOTEROL FUMARATE 160-4.5 MCG/ACT IN AERO: 2.0000 | INHALATION_SPRAY | Freq: Two times a day (BID) | RESPIRATORY_TRACT | 5 refills | Status: DC

## 2017-02-12 MED ORDER — ALBUTEROL SULFATE HFA 108 (90 BASE) MCG/ACT IN AERS: INHALATION_SPRAY | RESPIRATORY_TRACT | 1 refills | Status: DC

## 2017-02-12 NOTE — Progress Notes (Signed)
NEW PATIENT  Date of Service/Encounter:  02/12/17  Referring provider: Gerrit Heck, MD   Assessment:   Mild persistent asthma, uncomplicated  Chronic allergic rhinitis (grasses, weeds, molds, dust mite, cat, dog, feathers, horse, cockroach)  Adverse food reaction (clinical rxn to egg, skin prick testing positive to peanut, soy, wheat)   Asthma Reportables:  Severity: mild persistent  Risk: high Control: not well controlled   Plan/Recommendations:   1. Mild persistent asthma, uncomplicated - Lung testing showed evidence of asthma. - You did respond well to a nebulizer treatment. - Start Symbicort 160/4.5 two puffs twice daily for two weeks during repsiratory flares. - Daily controller medication(s): Singulair '10mg'$  daily - Rescue medications: Ventolin 4 puffs every 4-6 hours as needed - Changes during respiratory infections or worsening symptoms: add Symbicort 160/4.5 to 2 puffs twice daily for TWO WEEKS. - Asthma control goals:  * Full participation in all desired activities (may need albuterol before activity) * Albuterol use two time or less a week on average (not counting use with activity) * Cough interfering with sleep two time or less a month * Oral steroids no more than once a year * No hospitalizations  2. Chronic allergic rhinitis - Testing was positive to grasses, weeds, molds, dust mite, cat, dog, feathers, horse, cockroach - Avoidance measures provided.  - Continue with Flonase 2 sprays per nostril daily. - Continue with antihistamines daily as needed. - Information on allergen immunotherapy provided.  - Please check with her insurance company prior to starting to check on co-pays.  3. Adverse food reaction - We will get testing to confirm the peanut, soy, and wheat allergies. - We will also get testing to assess for an egg allergy. - It should be noted that several studies have confirmed the safety of giving the influenza vaccination to  all egg-allergic patients, even those with anaphylaxis.  - However we are happy to administer the flu vaccine at our office if Ms. Maciolek would feel more comfortable doing it this way.   4. Return in about 3 months (around 05/15/2017).   Subjective:   Nyna Chilton is a 71 y.o. female presenting today for evaluation of  Chief Complaint  Patient presents with  . Asthma    Increased flares and increased episodes of bronchitis  . Allergic Rhinitis     Ayana Imhof has a history of the following: Patient Active Problem List   Diagnosis Date Noted  . S/P mastectomy 05/10/2014  . Cancer of central portion of right female breast ([place]) 09/23/2013  . Pulmonary infiltrates 01/03/2013  . Acute respiratory failure with hypoxia ([place]) 12/29/2012  . Hypokalemia 12/29/2012  . Pulmonary embolism ([place]) 12/28/2012  . Healthcare-associated pneumonia 12/27/2012  . Acute respiratory distress 12/27/2012  . Asthma   . GERD (gastroesophageal reflux disease)   . Dysrhythmia   . Allergy   . Arthritis   . Clotting disorder ([place])   . History of chemotherapy   . DVT (deep venous thrombosis) ([place])   . DVT of lower extremity, bilateral ([place]) 12/22/2012  . PSVT (paroxysmal supraventricular tachycardia) ([place]) 10/14/2012  . PONV (postoperative nausea and vomiting) 09/13/2012  . PAT (paroxysmal atrial tachycardia) ([place])     History obtained from: chart review and patient.  Becky Augusta was referred by Gerrit Heck, MD.     Kathi is a 71 y.o. female presenting for an allergy and asthma evaluation. She has a history significant for chemo (inflammatory breast cancer per the patient) with multiple complications including respiratory failure  requiring intubation in 2014. She currently endorses 3 weeks of a prolonged cough.   Asthma/Respiratory Symptom History: She has used albuterol intermittently throughout the years. This worsened after an episode last year. She was able to get through it with some  Ventolin. She was started on montelukast last year. This has been a good addition for her. She weaned herself off the summer of 2017. However when she gets a cold they entire cycle starts again. She does not get colds too often. She has never taken the flu shot because she has an allergy to eggs.   Allergic Rhinitis Symptom History: She has had rhinorrhea since she was a child. Currently she is taking Ventolin as needed. She does endorse nasal symptoms throughout the year but primarily in the spring time and fall. She has the itching nasal congestion and rhinorrhea. She also endorses itching. She has been using cetirizine only as needed, typically for one week. She uses Flonase as needed.   Food Allergy Symptom History: She was diagnosed with an egg allergy on multiple occasions via skin testing. She has reacted to meringue. She has tolerated baked eggs on all occasions. She only eats eggs in her own home. She did have urticaria from touching meringue. She is also avoiding milk but she does eat cheese without a problem. She does eat peanuts and tree nuts. She eats seafood.   Geanna does have a history of inflammatory breast cancer. She has undergone multiple treatments for this. Her last bout of chemotherapy was in 2014. She has received chest radiation as well. Prior to the diagnosis of breast cancer, she was well without any medications. Otherwise, there is no history of other atopic diseases, including stinging insect allergies, or urticaria. There is no significant infectious history. Vaccinations are up to date.    Past Medical History: Patient Active Problem List   Diagnosis Date Noted  . S/P mastectomy 05/10/2014  . Cancer of central portion of right female breast ([place]) 09/23/2013  . Pulmonary infiltrates 01/03/2013  . Acute respiratory failure with hypoxia ([place]) 12/29/2012  . Hypokalemia 12/29/2012  . Pulmonary embolism ([place]) 12/28/2012  . Healthcare-associated pneumonia 12/27/2012  . Acute  respiratory distress 12/27/2012  . Asthma   . GERD (gastroesophageal reflux disease)   . Dysrhythmia   . Allergy   . Arthritis   . Clotting disorder ([place])   . History of chemotherapy   . DVT (deep venous thrombosis) ([place])   . DVT of lower extremity, bilateral ([place]) 12/22/2012  . PSVT (paroxysmal supraventricular tachycardia) ([place]) 10/14/2012  . PONV (postoperative nausea and vomiting) 09/13/2012  . PAT (paroxysmal atrial tachycardia) ([facility])     Medication List:  Allergies as of 02/12/2017      Reactions   Anesthetics, Amide Nausea And Vomiting   Projectile vomitting-Nausea- with 24hrs of dry heaves. With any anesthetics   Bactrim [sulfamethoxazole-trimethoprim] Other (See Comments)   Massive diarrhea, nausea vomiting, platelets dropped, hemoglobin dropped, admitted to hospital   Sulfa Antibiotics Nausea And Vomiting, Other (See Comments)   Crazy feeling in head Life threatening reaction, decreased blood counts   Codeine Nausea And Vomiting   Codeine Phosphate Nausea And Vomiting   Eggs Or Egg-derived Products Hives      Medication List       Accurate as of 02/12/17 11:14 PM. Always use your most recent med list.          albuterol 108 (90 Base) MCG/ACT inhaler Commonly known as:  PROAIR HFA 4 Puffs every 4-6  hours as needed for cough or wheezing.  Use with spacer.   atorvastatin 10 MG tablet Commonly known as:  LIPITOR Take 10 mg by mouth daily.   budesonide-formoterol 160-4.5 MCG/ACT inhaler Commonly known as:  SYMBICORT Inhale 2 puffs into the lungs 2 (two) times daily. Use with spacer.   calcium-vitamin D 500-200 MG-UNIT tablet Commonly known as:  OSCAL WITH D Take 1 tablet by mouth every other day.   fish oil-omega-3 fatty acids 1000 MG capsule Take 1 g by mouth daily.   FLAX SEEDS PO Take 1 tablet by mouth daily.   fluticasone 50 MCG/ACT nasal spray Commonly known as:  FLONASE Place 1 spray into both nostrils as needed for allergies or rhinitis.     glucosamine-chondroitin 500-400 MG tablet Take 1 tablet by mouth daily.   lisinopril 2.5 MG tablet Commonly known as:  PRINIVIL,ZESTRIL Take 2.5 mg by mouth daily.   metoprolol succinate 50 MG 24 hr tablet Commonly known as:  TOPROL-XL TAKE ONE TABLET BY MOUTH ONCE DAILY BEFORE BREAKFAST. TAKE WITH OR IMMEDIATELY FOLLOWING A MEAL   montelukast 10 MG tablet Commonly known as:  SINGULAIR Take 1 tablet (10 mg total) by mouth daily.   multivitamin with minerals Tabs tablet Take 1 tablet by mouth every other day.   omeprazole 20 MG capsule Commonly known as:  PRILOSEC Take 20 mg by mouth daily. For heartburn   SUPER B COMPLEX PO Take 1 tablet by mouth daily.   vitamin C 500 MG tablet Commonly known as:  ASCORBIC ACID Take 500 mg by mouth daily.   vitamin E 1000 UNIT capsule Take 1,000 Units by mouth daily.   XARELTO 20 MG Tabs tablet Generic drug:  rivaroxaban TAKE ONE TABLET BY MOUTH ONCE DAILY WITH  SUPPER       Birth History: non-contributory.   Developmental History: non-contributory.   Past Surgical History: Past Surgical History:  Procedure Laterality Date  . BREAST BIOPSY Right 2013 X 3  . CHALAZION EXCISION Right 1980's   right( MD office)  . COLONOSCOPY    . DILATION AND CURETTAGE OF UTERUS  1998 X 3  . INSERTION OF VENA CAVA FILTER  12/2012  . MASTECTOMY Left 05/10/2014   PROPHYLACTIC   . MASTECTOMY MODIFIED RADICAL Right 02/08/2013   Procedure: MASTECTOMY MODIFIED RADICAL;  Surgeon: Rolm Bookbinder, MD;  Location: [facility];  Service: General;  Laterality: Right;  . MASTECTOMY MODIFIED RADICAL Right   . PORT-A-CATH REMOVAL  05/10/2014  . PORT-A-CATH REMOVAL N/A 05/10/2014   Procedure: REMOVAL PORT-A-CATH;  Surgeon: Rolm Bookbinder, MD;  Location: [place];  Service: General;  Laterality: N/A;  . PORTACATH PLACEMENT  07/21/2012   Procedure: INSERTION PORT-A-CATH;  Surgeon: Rolm Bookbinder, MD;  Location: [place];  Service: General;   Laterality: Left;/ Replacement done 10'13  . SIMPLE MASTECTOMY WITH AXILLARY SENTINEL NODE BIOPSY Left 05/10/2014   Procedure: PROPHYLACTIC LEFT MASTECTOMY;  Surgeon: Rolm Bookbinder, MD;  Location: [place];  Service: General;  Laterality: Left;  . TONSILLECTOMY    . TUBAL LIGATION  ~ 1974  . TUMOR EXCISION Right 1970   giant cell, off my forearm"     Family History: Family History  Problem Relation Age of Onset  . Diabetes Mother   . Heart disease Mother   . Hypertension Mother   . Hyperlipidemia Mother   . Breast cancer Mother 65    lobular breast cancer  . Heart disease Maternal Grandmother     died in her 36s  from heart disease  . Lung cancer Father 54    adenocarcinoma  . Allergic rhinitis Father   . Asthma Father   . Cancer Paternal Uncle     dx in mid 65s with a cancer in the leg, died late 90s; grandpaternal half uncle  . Allergic rhinitis Sister   . Asthma Sister   . Heart disease Maternal Aunt   . Heart disease Maternal Uncle   . Allergic rhinitis Son   . Asthma Son   . Angioedema Neg Hx   . Eczema Neg Hx   . Immunodeficiency Neg Hx   . Urticaria Neg Hx      Social History: Navika lives at home with family. She lives in a house that is 72 years old. There is carpeting throughout the main living area. There is one in the bedrooms. She has electric heating and central cooling. There are no animals that are outside the home. She does have dust mite cover on her bed, but not her pillows. There is no tobacco exposure. She is currently retired. She doesn't smoke or 1970s through 98. She is an avid hiker and walks at least one mile per day.     Review of Systems: a 14-point review of systems is pertinent for what is mentioned in HPI.  Otherwise, all other systems were negative. Constitutional: negative other than that listed in the HPI Eyes: negative other than that listed in the HPI Ears, nose, mouth, throat, and face: negative other than that listed in the  HPI Respiratory: negative other than that listed in the HPI Cardiovascular: negative other than that listed in the HPI Gastrointestinal: negative other than that listed in the HPI Genitourinary: negative other than that listed in the HPI Integument: negative other than that listed in the HPI Hematologic: negative other than that listed in the HPI Musculoskeletal: negative other than that listed in the HPI Neurological: negative other than that listed in the HPI Allergy/Immunologic: negative other than that listed in the HPI    Objective:   Blood pressure 120/78, pulse 90, temperature 98.3 F (36.8 C), temperature source Oral, resp. rate 16, height 5' (1.524 m), weight 139 lb 12.8 oz (63.4 kg), SpO2 96 %. Body mass index is 27.3 kg/m.   Physical Exam:  General: Alert, interactive, in no acute distress. Very friendly and talkative.  Eyes: No conjunctival injection present on the right, No conjunctival injection present on the left, PERRL bilaterally, No discharge on the right, No discharge on the left and No Horner-Trantas dots present Ears: Right TM pearly gray with normal light reflex, Left TM pearly gray with normal light reflex, Right TM intact without perforation and Left TM intact without perforation.  Nose/Throat: External nose within normal limits and septum midline, turbinates markedly edematous with clear discharge, post-pharynx erythematous with cobblestoning in the posterior oropharynx. Tonsils 2+ without exudates. Right sided nasal polyp versus clear mucous plug. Neck: Supple without thyromegaly.  Adenopathy: no enlarged lymph nodes appreciated in the anterior cervical, occipital, axillary, epitrochlear, inguinal, or popliteal regions Lungs: Clear to auscultation without wheezing, rhonchi or rales. No increased work of breathing. CV: Normal S1/S2, no murmurs. Capillary refill <2 seconds.  Abdomen: Nondistended, nontender. No guarding or rebound tenderness. Bowel sounds  present in all fields and hyperactive  Skin: Warm and dry, without lesions or rashes. Extremities:  No clubbing, cyanosis or edema. Neuro:   Grossly intact. No focal deficits appreciated. Responsive to questions.  Diagnostic studies:   Spirometry: results abnormal (FEV1: 1.88/68%,  FVC: 2.37/81%, FEV1/FVC: 80%).    Spirometry consistent with mild obstructive disease. Albuterol/Atrovent nebulizer treatment given in clinic with significant improvement. Her FEV1 increased 28% while her FVC increased 20%. Her FEV1 FVC ratio increased from 83% to 89%. In addition, her FEF 25-75% increased from 31% to 84%.  Allergy Studies:   Indoor/Outdoor Percutaneous Adult Environmental Panel: positive to bahia grass, [place] grass, johnson grass, [place] blue grass, meadow fescue grass, perennial rye grass, sweet vernal grass, timothy grass, burweed marsh elder, short ragweed, giant ragweed, English plantain, rough marsh elder, common mugwort, Aspergillus, Drechslera, Mucor, Fusarium, Aureobasidium, epicoccum, Phoma, Candida, Tricophyton, Df mite, Dp mites, cat, dog, mixed feather, horse and cockroach. Otherwise negative with adequate controls.  Most Common Foods Panel (peanut, tree nut, soy, fish mix, shellfish mix, wheat, milk, egg): positive for peanut (5x8), soybean (4x9), and wheat (4x5) with adequate controls      Salvatore Marvel, MD [facility]

## 2017-02-12 NOTE — Patient Instructions (Addendum)
1. Mild persistent asthma, uncomplicated - Lung testing showed evidence of asthma. - You did respond well to a nebulizer treatment. - Start Symbicort 160/4.5 two puffs twice daily for two weeks during repsiratory flares. - Daily controller medication(s): Singulair '10mg'$  daily - Rescue medications: Ventolin 4 puffs every 4-6 hours as needed - Changes during respiratory infections or worsening symptoms: add Symbicort 160/4.5 to 2 puffs twice daily for TWO WEEKS. - Asthma control goals:  * Full participation in all desired activities (may need albuterol before activity) * Albuterol use two time or less a week on average (not counting use with activity) * Cough interfering with sleep two time or less a month * Oral steroids no more than once a year * No hospitalizations  2. Chronic rhinitis - Testing was positive to grasses, weeds, molds, dust mite, cat, dog, feathers, horse, cockroach - Avoidance measures provided.  - Continue with Flonase 2 sprays per nostril daily. - Continue with antihistamines daily as needed. - Information on allergen immunotherapy provided.  - Please check with her insurance company prior to starting to check on co-pays.  3. Adverse food reaction - We will get testing to confirm the peanut, soy, and wheat allergies. - We will also get testing to assess for an egg allergy.  4. Return in about 3 months (around 05/15/2017).  Please inform us of any Emergency Department visits, hospitalizations, or changes in symptoms. Call us before going to the ED for breathing or allergy symptoms since we might be able to fit you in for a sick visit. Feel free to contact us anytime with any questions, problems, or concerns.  It was a pleasure to meet you today! Happy spring!   Websites that have reliable patient information: 1. American Academy of Asthma, Allergy, and Immunology: www.aaaai.org 2. Food Allergy Research and Education (FARE): foodallergy.org 3. Mothers of Asthmatics:  http://www.asthmacommunitynetwork.org 4. American College of Allergy, Asthma, and Immunology: www.acaai.org  Reducing Pollen Exposure  The American Academy of Allergy, Asthma and Immunology suggests the following steps to reduce your exposure to pollen during allergy seasons.    1. Do not hang sheets or clothing out to dry; pollen may collect on these items. 2. Do not mow lawns or spend time around freshly cut grass; mowing stirs up pollen. 3. Keep windows closed at night.  Keep car windows closed while driving. 4. Minimize morning activities outdoors, a time when pollen counts are usually at their highest. 5. Stay indoors as much as possible when pollen counts or humidity is high and on windy days when pollen tends to remain in the air longer. 6. Use air conditioning when possible.  Many air conditioners have filters that trap the pollen spores. 7. Use a HEPA room air filter to remove pollen form the indoor air you breathe.  Control of Mold Allergen  Mold and fungi can grow on a variety of surfaces provided certain temperature and moisture conditions exist.  Outdoor molds grow on plants, decaying vegetation and soil.  The major outdoor mold, Alternaria and Cladosporium, are found in very high numbers during hot and dry conditions.  Generally, a late Summer - Fall peak is seen for common outdoor fungal spores.  Rain will temporarily lower outdoor mold spore count, but counts rise rapidly when the rainy period ends.  The most important indoor molds are Aspergillus and Penicillium.  Dark, humid and poorly ventilated basements are ideal sites for mold growth.  The next most common sites of mold growth are the bathroom and  the kitchen.  Outdoor [employer] 1. Use air conditioning and keep windows closed 2. Avoid exposure to decaying vegetation. 3. Avoid leaf raking. 4. Avoid grain handling. 5. Consider wearing a face mask if working in moldy areas.  Indoor Mold Control 1. Maintain humidity  below 50%. 2. Clean washable surfaces with 5% bleach solution. 3. Remove sources e.g. contaminated carpets.  Control of Dog or Cat Allergen  Avoidance is the best way to manage a dog or cat allergy. If you have a dog or cat and are allergic to dog or cats, consider removing the dog or cat from the home. If you have a dog or cat but don't want to find it a new home, or if your family wants a pet even though someone in the household is allergic, here are some strategies that may help keep symptoms at bay:  1. Keep the pet out of your bedroom and restrict it to only a few rooms. Be advised that keeping the dog or cat in only one room will not limit the allergens to that room. 2. Don't pet, hug or kiss the dog or cat; if you do, wash your hands with soap and water. 3. High-efficiency particulate air (HEPA) cleaners run continuously in a bedroom or living room can reduce allergen levels over time. 4. Regular use of a high-efficiency vacuum cleaner or a central vacuum can reduce allergen levels. 5. Giving your dog or cat a bath at least once a week can reduce airborne allergen.  Control of House Dust Mite Allergen    House dust mites play a major role in allergic asthma and rhinitis.  They occur in environments with high humidity wherever human skin, the food for dust mites is found. High levels have been detected in dust obtained from mattresses, pillows, carpets, upholstered furniture, bed covers, clothes and soft toys.  The principal allergen of the house dust mite is found in its feces.  A gram of dust may contain 1,000 mites and 250,000 fecal particles.  Mite antigen is easily measured in the air during house cleaning activities.    1. Encase mattresses, including the box spring, and pillow, in an air tight cover.  Seal the zipper end of the encased mattresses with wide adhesive tape. 2. Wash the bedding in water of 130 degrees Farenheit weekly.  Avoid cotton comforters/quilts and flannel  bedding: the most ideal bed covering is the dacron comforter. 3. Remove all upholstered furniture from the bedroom. 4. Remove carpets, carpet padding, rugs, and non-washable window drapes from the bedroom.  Wash drapes weekly or use plastic window coverings. 5. Remove all non-washable stuffed toys from the bedroom.  Wash stuffed toys weekly. 6. Have the room cleaned frequently with a vacuum cleaner and a damp dust-mop.  The patient should not be in a room which is being cleaned and should wait 1 hour after cleaning before going into the room. 7. Close and seal all heating outlets in the bedroom.  Otherwise, the room will become filled with dust-laden air.  An electric heater can be used to heat the room. 8. Reduce indoor humidity to less than 50%.  Do not use a humidifier.  Control of Cockroach Allergen  Cockroach allergen has been identified as an important cause of acute attacks of asthma, especially in urban settings.  There are fifty-five species of cockroach that exist in the [place], however only three, the [place], [place] species produce allergen that can affect patients with Asthma.  Allergens  can be obtained from fecal particles, egg casings and secretions from cockroaches.    1. Remove food sources. 2. Reduce access to water. 3. Seal access and entry points. 4. Spray runways with 0.5-1% Diazinon or Chlorpyrifos 5. Blow boric acid power under stoves and refrigerator. 6. Place bait stations (hydramethylnon) at feeding sites.

## 2017-02-17 DIAGNOSIS — T781XXD Other adverse food reactions, not elsewhere classified, subsequent encounter: Secondary | ICD-10-CM | POA: Diagnosis not present

## 2017-02-18 LAB — ALLERGEN, WHEAT, F4: Wheat IgE: 1.34 kU/L — ABNORMAL HIGH

## 2017-02-18 LAB — ALLERGEN, PEANUT F13: PEANUT IGE: 0.82 kU/L — AB

## 2017-02-18 LAB — EGG COMPONENT PANEL
Allergen, Ovalbumin, f232: 0.49 kU/L — ABNORMAL HIGH
Allergen, Ovomucoid, f233: 1.16 kU/L — ABNORMAL HIGH

## 2017-03-23 DIAGNOSIS — C50911 Malignant neoplasm of unspecified site of right female breast: Secondary | ICD-10-CM | POA: Diagnosis not present

## 2017-03-27 ENCOUNTER — Other Ambulatory Visit: Payer: Self-pay | Admitting: Hematology and Oncology

## 2017-03-27 DIAGNOSIS — C50111 Malignant neoplasm of central portion of right female breast: Secondary | ICD-10-CM

## 2017-05-18 ENCOUNTER — Ambulatory Visit (INDEPENDENT_AMBULATORY_CARE_PROVIDER_SITE_OTHER): Payer: Medicare Other | Admitting: Allergy & Immunology

## 2017-05-18 ENCOUNTER — Encounter: Payer: Self-pay | Admitting: Allergy & Immunology

## 2017-05-18 VITALS — BP 100/70 | HR 68 | Temp 98.0°F | Resp 16

## 2017-05-18 DIAGNOSIS — T781XXD Other adverse food reactions, not elsewhere classified, subsequent encounter: Secondary | ICD-10-CM

## 2017-05-18 DIAGNOSIS — J4530 Mild persistent asthma, uncomplicated: Secondary | ICD-10-CM | POA: Diagnosis not present

## 2017-05-18 DIAGNOSIS — J3089 Other allergic rhinitis: Secondary | ICD-10-CM

## 2017-05-18 NOTE — Progress Notes (Signed)
FOLLOW UP  Date of Service/Encounter:  05/18/17   Assessment:   Mild persistent asthma, uncomplicated  Chronic nonseasonal allergic (grasses, weeds, molds, dust mite, cat, dog, feathers, horse, cockroach)  Adverse food reaction (peanut, soy, wheat, egg) - with varying amounts of reactivity   Asthma Reportables:  Severity: mild persistent  Risk: high Control: not well controlled  Seasonal Influenza Vaccine: yes    Plan/Recommendations:   1. Mild persistent asthma, uncomplicated - Lung testing was slightly decreased today. - I honestly feel that she should remain on her Symbicort on a daily basis, but she is not amenable to this idea.  - Therefore I would not stop your montelukast at this time.  - Daily controller medication(s): Singulair 10mg  daily - Rescue medications: Ventolin 4 puffs every 4-6 hours as needed - Changes during respiratory infections or worsening symptoms: add Symbicort 160/4.5 to 2 puffs twice daily for TWO WEEKS. - Asthma control goals:  * Full participation in all desired activities (may need albuterol before activity) * Albuterol use two time or less a week on average (not counting use with activity) * Cough interfering with sleep two time or less a month * Oral steroids no more than once a year * No hospitalizations  2. Chronic allergic rhinitis (grasses, weeds, molds, dust mite, cat, dog, feathers, horse, cockroach) - Continue with Flonase 2 sprays per nostril daily. - Continue with antihistamines daily as needed. - Information on allergen immunotherapy provided.   - We will plan to start in the late fall/early winter.   3. Adverse food reaction - We will get testing to confirm the peanut, soy, and wheat allergies. - We will also get testing to assess for an egg allergy.  4. Return in about 4 months (around 09/17/2017).   Subjective:   Margaret Cockerill is a 71 y.o. female presenting today for follow up of  Chief Complaint  Patient presents  with  . Asthma    Mignon Bechler has a history of the following: Patient Active Problem List   Diagnosis Date Noted  . S/P mastectomy 05/10/2014  . Cancer of central portion of right female breast ([employer]) 09/23/2013  . Pulmonary infiltrates 01/03/2013  . Acute respiratory failure with hypoxia ([place]) 12/29/2012  . Hypokalemia 12/29/2012  . Pulmonary embolism ([place]) 12/28/2012  . Healthcare-associated pneumonia 12/27/2012  . Acute respiratory distress 12/27/2012  . Asthma   . GERD (gastroesophageal reflux disease)   . Dysrhythmia   . Allergy   . Arthritis   . Clotting disorder ([place])   . History of chemotherapy   . DVT (deep venous thrombosis) ([place])   . DVT of lower extremity, bilateral ([place]) 12/22/2012  . PSVT (paroxysmal supraventricular tachycardia) ([place]) 10/14/2012  . PONV (postoperative nausea and vomiting) 09/13/2012  . PAT (paroxysmal atrial tachycardia) ([place])     History obtained from: chart review and patient.  Becky Augusta was referred by Leighton Ruff, MD.     Gloris is a 71 y.o. female presenting for a follow up visit. She was last seen in March 2018 for her first appointment. At that time, her testing showed reactions to grass, weeds, molds, dust mite, cat, dog, feathers, horse, cockroach. Her food allergy testing was positive to peanut, soy, and wheat. She's had a clinical reaction to egg, but her testing was negative. Wheat IgE was 1.34. Peanut IgE was 0.82. Her egg IgE was very low, with a level of 0.49 to ovalbumin and 1.16 to ovomucoid. For her asthma, she did have reversibility with  albuterol. We continued her on Singulair 10 mg daily, and added Symbicort 160/4.5 g 2 puffs twice a day for 2 weeks during respiratory flares.  Since the last visit, she has done well. She remains on the montelukast. She took the Symbicort for two weeks and then stopped. She came off two weeks ago and felt bad with some wheezing. She did not start the Symbicort as recommended. She is  outside working in her yard which might have been triggering her symptoms. She does walk every morning. She feels her asthma has been well controlled. She has not required rescue medication, experienced nocturnal awakenings due to lower respiratory symptoms, nor have activities of daily living been limited. She has required no Emergency Department or Urgent Care visits for her asthma. She has required zero courses of systemic steroids for asthma exacerbations since the last visit. ACT score today is 22, indicating excellent asthma symptom control.   She continues to have allergic rhinitis. However, she does not do much for her symptoms. She does spend quite a bit of time in the outdoors, as she has an avid gardener. 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 We did discuss allergen immunotherapy at the last visit, she remained skeptical. If she is going to start it, she would not do it until the fall season since she is very busy during the growing season.  Her food allergies he remained stable. Although she had positive testing to wheat, soy, peanuts, she has not really avoided any of these foods. She has no adverse reactions to these foods whatsoever. She does continue to avoid eggs since she has had clinical reactions to these in the past.  Otherwise, there have been no changes to her past medical history, surgical history, family history, or social history.    Review of Systems: a 14-point review of systems is pertinent for what is mentioned in HPI.  Otherwise, all other systems were negative. Constitutional: negative other than that listed in the HPI Eyes: negative other than that listed in the HPI Ears, nose, mouth, throat, and face: negative other than that listed in the HPI Respiratory: negative other than that listed in the HPI Cardiovascular:  negative other than that listed in the HPI Gastrointestinal: negative other than that listed in the HPI Genitourinary: negative other than that listed in the HPI Integument: negative other than that listed in the HPI Hematologic: negative other than that listed in the HPI Musculoskeletal: negative other than that listed in the HPI Neurological: negative other than that listed in the HPI Allergy/Immunologic: negative other than that listed in the HPI    Objective:   Blood pressure 100/70, pulse 68, temperature 98 F (36.7 C), temperature source Oral, resp. rate 16, SpO2 98 %. There is no height or weight on file to calculate BMI.   Physical Exam:  General: Alert, interactive, in no acute distress. Pleasant friendly female.  Eyes: No conjunctival injection present on the right, No conjunctival injection present on the left, PERRL bilaterally, No discharge on the right, No discharge on the left and No Horner-Trantas dots present Ears: Right TM pearly gray with normal light reflex, Left TM pearly gray with normal light reflex, Right TM intact without perforation and Left TM intact without perforation.  Nose/Throat: External nose within normal limits and septum midline, turbinates edematous and  pale with clear discharge, post-pharynx erythematous with cobblestoning in the posterior oropharynx. Tonsils 2+ without exudates Neck: Supple without thyromegaly. Lungs: Clear to auscultation without wheezing, rhonchi or rales. No increased work of breathing. CV: Normal S1/S2, no murmurs. Capillary refill <2 seconds.  Skin: Warm and dry, without lesions or rashes. Neuro:   Grossly intact. No focal deficits appreciated. Responsive to questions.   Diagnostic studies:   Spirometry: results normal (FEV1: 1.47/80%, FVC: 1.84/79%, FEV1/FVC: 79%).    Spirometry consistent with normal pattern.  Allergy Studies: none    Salvatore Marvel, MD [facility]

## 2017-05-18 NOTE — Patient Instructions (Addendum)
1. Mild persistent asthma, uncomplicated - Lung testing was slightly decreased today. - Therefore I would not stop your montelukast at this time.  - Daily controller medication(s): Singulair 10mg  daily - Rescue medications: Ventolin 4 puffs every 4-6 hours as needed - Changes during respiratory infections or worsening symptoms: add Symbicort 160/4.5 to 2 puffs twice daily for TWO WEEKS. - Asthma control goals:  * Full participation in all desired activities (may need albuterol before activity) * Albuterol use two time or less a week on average (not counting use with activity) * Cough interfering with sleep two time or less a month * Oral steroids no more than once a year * No hospitalizations  2. Chronic allergic rhinitis (grasses, weeds, molds, dust mite, cat, dog, feathers, horse, cockroach) - Continue with Flonase 2 sprays per nostril daily. - Continue with antihistamines daily as needed. - Information on allergen immunotherapy provided.   - We will plan to start in the late fall/early winter.   3. Adverse food reaction - We will get testing to confirm the peanut, soy, and wheat allergies. - We will also get testing to assess for an egg allergy.  4. Return in about 4 months (around 09/17/2017).  Please inform us of any Emergency Department visits, hospitalizations, or changes in symptoms. Call us before going to the ED for breathing or allergy symptoms since we might be able to fit you in for a sick visit. Feel free to contact us anytime with any questions, problems, or concerns.  It was a pleasure to see you again today! Happy summer!   Websites that have reliable patient information: 1. American Academy of Asthma, Allergy, and Immunology: www.aaaai.org 2. Food Allergy Research and Education (FARE): foodallergy.org 3. Mothers of Asthmatics: http://www.asthmacommunitynetwork.org 4. American College of Allergy, Asthma, and Immunology: www.acaai.org

## 2017-06-05 DIAGNOSIS — R21 Rash and other nonspecific skin eruption: Secondary | ICD-10-CM | POA: Diagnosis not present

## 2017-06-24 ENCOUNTER — Other Ambulatory Visit: Payer: Self-pay | Admitting: Hematology and Oncology

## 2017-06-24 DIAGNOSIS — C50111 Malignant neoplasm of central portion of right female breast: Secondary | ICD-10-CM

## 2017-07-16 DIAGNOSIS — H2513 Age-related nuclear cataract, bilateral: Secondary | ICD-10-CM | POA: Diagnosis not present

## 2017-08-07 ENCOUNTER — Other Ambulatory Visit: Payer: Self-pay | Admitting: Allergy & Immunology

## 2017-09-17 ENCOUNTER — Ambulatory Visit (INDEPENDENT_AMBULATORY_CARE_PROVIDER_SITE_OTHER): Payer: Medicare Other | Admitting: Allergy & Immunology

## 2017-09-17 ENCOUNTER — Encounter: Payer: Self-pay | Admitting: Allergy & Immunology

## 2017-09-17 VITALS — BP 110/68 | HR 68 | Resp 16

## 2017-09-17 DIAGNOSIS — J302 Other seasonal allergic rhinitis: Secondary | ICD-10-CM | POA: Diagnosis not present

## 2017-09-17 DIAGNOSIS — J4540 Moderate persistent asthma, uncomplicated: Secondary | ICD-10-CM | POA: Insufficient documentation

## 2017-09-17 DIAGNOSIS — J3089 Other allergic rhinitis: Secondary | ICD-10-CM | POA: Diagnosis not present

## 2017-09-17 NOTE — Progress Notes (Signed)
FOLLOW UP  Date of Service/Encounter:  09/17/17   Assessment:   Moderate persistent asthma, uncomplicated  Seasonal and perennial allergic rhinitis   Plan/Recommendations:   1. Mild persistent asthma, uncomplicated - Lung testing looked quite normal today.  - We will not make any medication changes at this time.  - We will consider changing her to [facility] to make her regimen easier, but she will check her formulary to make sure that this is covered.  - Daily controller medication(s): Singulair 10mg  daily or Symbicort 160/4.46mcg two puffs twice daily with spacer - Prior to physical activity: ProAir 2 puffs 10-15 minutes before physical activity. - Rescue medications: ProAir 4 puffs every 4-6 hours as needed - Asthma control goals:  * Full participation in all desired activities (may need albuterol before activity) * Albuterol use two time or less a week on average (not counting use with activity) * Cough interfering with sleep two time or less a month * Oral steroids no more than once a year * No hospitalizations  2. Chronic allergic rhinitis (grasses, weeds, molds, dust mite, cat, dog, feathers, horse, cockroach) - Continue with Flonase 2 sprays per nostril daily. - Continue with antihistamines daily as needed. - We will consider starting allergy shots in the future.  3. Return in about 3 months (around 12/18/2017).  Subjective:   Jessica Wells is a 71 y.o. female presenting today for follow up of  Chief Complaint  Patient presents with  . Asthma    states that she is feeling really good and is very pleased. no asthma problems.     Jessica Wells has a history of the following: Patient Active Problem List   Diagnosis Date Noted  . Moderate persistent asthma, uncomplicated 02/19/2247  . Seasonal and perennial allergic rhinitis 09/17/2017  . S/P mastectomy 05/10/2014  . Cancer of central portion of right female breast ([place]) 09/23/2013  . Pulmonary infiltrates 01/03/2013    . Acute respiratory failure with hypoxia ([place]) 12/29/2012  . Hypokalemia 12/29/2012  . Pulmonary embolism ([place]) 12/28/2012  . Healthcare-associated pneumonia 12/27/2012  . Acute respiratory distress 12/27/2012  . Asthma   . GERD (gastroesophageal reflux disease)   . Dysrhythmia   . Allergy   . Arthritis   . Clotting disorder ([place])   . History of chemotherapy   . DVT (deep venous thrombosis) ([place])   . DVT of lower extremity, bilateral ([place]) 12/22/2012  . PSVT (paroxysmal supraventricular tachycardia) ([place]) 10/14/2012  . PONV (postoperative nausea and vomiting) 09/13/2012  . PAT (paroxysmal atrial tachycardia) ([place])     History obtained from: chart review and patient.  [name] Primary Care Provider is Leighton Ruff, MD.     Jessica Wells is a 71 y.o. female presenting for a follow up visit. She was last see in June 2018. At that time, she was wanting to get off of medications. I felt that she needed to stay on her Symbicort, but she did not want to do this. Therefore we kept her on Singulair 10mg  daily as a controller. She has a history of chronic allergic rhinitis with sensitizations to grasses, weeds, molds, dust mite, cat, dog, feathers, horse, cockroach. We continued her on Flonase 2 sprays per nostril daily as well as antihistamines as needed. She was interested in starting allergen immunotherapy. We did get labs to look for peanut, soy, and we allergies as well as egg allergies; these were all low, and I felt that they were not clinically significant.  Since the last visit, she  has actually done well. She did end up starting the Symbicort because her symptoms worsened. She tells me today that I can say "I told you so". Shaniya's asthma has been well controlled. She has not required rescue medication, experienced nocturnal awakenings due to lower respiratory symptoms, nor have activities of daily living been limited. She has required no Emergency Department or Urgent Care visits for  her asthma. She has required zero courses of systemic steroids for asthma exacerbations since the last visit. ACT score today is 25, indicating excellent asthma symptom control.  She remains on the Flonase every morning. She uses the montelukast at night. Although we had discussed starting allergy shots at the last visit, she is now comfortable holding off on this because she feels so great now.   She has been cancer free for five years. She has a history of inflammatory breast cancer and underwent radical mastectomy, radiation, and chemotherapy. She has an appointment with Dr. Lindi Adie in mid November to check on her status. She had two margaritas at the five year mark.    Otherwise, there have been no changes to her past medical history, surgical history, family history, or social history.    Review of Systems: a 14-point review of systems is pertinent for what is mentioned in HPI.  Otherwise, all other systems were negative. Constitutional: negative other than that listed in the HPI Eyes: negative other than that listed in the HPI Ears, nose, mouth, throat, and face: negative other than that listed in the HPI Respiratory: negative other than that listed in the HPI Cardiovascular: negative other than that listed in the HPI Gastrointestinal: negative other than that listed in the HPI Genitourinary: negative other than that listed in the HPI Integument: negative other than that listed in the HPI Hematologic: negative other than that listed in the HPI Musculoskeletal: negative other than that listed in the HPI Neurological: negative other than that listed in the HPI Allergy/Immunologic: negative other than that listed in the HPI    Objective:   Blood pressure 110/68, pulse 68, resp. rate 16, SpO2 97 %. There is no height or weight on file to calculate BMI.   Physical Exam:  General: Alert, interactive, in no acute distress. Pleasant female. Infectious smile.  Eyes: No conjunctival  injection bilaterally, no discharge on the right, no discharge on the left and no Horner-Trantas dots present. PERRL bilaterally. EOMI without pain. No photophobia.  Ears: Right TM pearly gray with normal light reflex, Left TM pearly gray with normal light reflex, Right TM intact without perforation and Left TM intact without perforation.  Nose/Throat: External nose within normal limits and septum midline. Turbinates edematous and pale with clear discharge. Posterior oropharynx mildly erythematous without cobblestoning in the posterior oropharynx. Tonsils 2+ without exudates.  Tongue without thrush. Adenopathy: shoddy bilateral anterior cervical lymphadenopathy Lungs: Clear to auscultation without wheezing, rhonchi or rales. No increased work of breathing. CV: Normal S1/S2. No murmurs. Capillary refill <2 seconds.  Skin: Warm and dry, without lesions or rashes. Neuro:   Grossly intact. No focal deficits appreciated. Responsive to questions.  Diagnostic studies:   Spirometry: results normal (FEV1: 1.76/97%, FVC: 2.26/93%, FEV1/FVC: 78%)  .    Spirometry consistent with normal pattern.   Allergy Studies: none     Salvatore Marvel, MD [place] [facility] [place]

## 2017-09-17 NOTE — Patient Instructions (Addendum)
1. Mild persistent asthma, uncomplicated - Lung testing looked quite normal today.  - We will not make any medication changes at this time.  - Daily controller medication(s): Singulair 10mg  daily or Symbicort 160/4.50mcg two puffs twice daily with spacer - Prior to physical activity: ProAir 2 puffs 10-15 minutes before physical activity. - Rescue medications: ProAir 4 puffs every 4-6 hours as needed - Asthma control goals:  * Full participation in all desired activities (may need albuterol before activity) * Albuterol use two time or less a week on average (not counting use with activity) * Cough interfering with sleep two time or less a month * Oral steroids no more than once a year * No hospitalizations  2. Chronic allergic rhinitis (grasses, weeds, molds, dust mite, cat, dog, feathers, horse, cockroach) - Continue with Flonase 2 sprays per nostril daily. - Continue with antihistamines daily as needed. - We will consider starting allergy shots in the future.  3. Return in about 3 months (around 12/18/2017).   Please inform us of any Emergency Department visits, hospitalizations, or changes in symptoms. Call us before going to the ED for breathing or allergy symptoms since we might be able to fit you in for a sick visit. Feel free to contact us anytime with any questions, problems, or concerns.  It was a pleasure to see you again today! Enjoy the fall season! Good luck with the oncology appointment in November!   Websites that have reliable patient information: 1. American Academy of Asthma, Allergy, and Immunology: www.aaaai.org 2. Food Allergy Research and Education (FARE): foodallergy.org 3. Mothers of Asthmatics: http://www.asthmacommunitynetwork.org 4. American College of Allergy, Asthma, and Immunology: www.acaai.org   Election Day is coming up on Tuesday, November 6th! Although it is too late to register to vote by mail, you can still register up to November 5th at any of the  early voting locations. Try to early vote in case there are problems with your registration!   If you are turned away at the polls, you have the right to request a provisional ballot, which is required by law!      [place]- [place] (open 8am - 5pm) First Floor [place], [place] (open [place]) [place], [place] (open 7am - 7pm)  [place], [employer] (open 7am - 7pm) [address], [employer] (open 7am - 7pm) [address] [place], [employer] (open 7am - 7pm) [place], [place] (open [place]) [place], [place] (open 7am - 7pm) [place], [employer] (open 7am - 7pm) [place], [place]

## 2017-09-23 ENCOUNTER — Other Ambulatory Visit: Payer: Self-pay | Admitting: Hematology and Oncology

## 2017-09-23 DIAGNOSIS — C50111 Malignant neoplasm of central portion of right female breast: Secondary | ICD-10-CM

## 2017-09-29 ENCOUNTER — Other Ambulatory Visit: Payer: Self-pay | Admitting: Cardiology

## 2017-10-14 ENCOUNTER — Other Ambulatory Visit: Payer: Self-pay

## 2017-10-14 DIAGNOSIS — C50111 Malignant neoplasm of central portion of right female breast: Secondary | ICD-10-CM

## 2017-10-14 DIAGNOSIS — Z171 Estrogen receptor negative status [ER-]: Principal | ICD-10-CM

## 2017-10-15 ENCOUNTER — Other Ambulatory Visit (HOSPITAL_BASED_OUTPATIENT_CLINIC_OR_DEPARTMENT_OTHER): Payer: Medicare Other

## 2017-10-15 ENCOUNTER — Other Ambulatory Visit: Payer: Medicare Other

## 2017-10-15 ENCOUNTER — Ambulatory Visit (HOSPITAL_COMMUNITY)
Admission: RE | Admit: 2017-10-15 | Discharge: 2017-10-15 | Disposition: A | Discharge status: Home / Self Care | Payer: Medicare Other | Source: Ambulatory Visit | Attending: Hematology and Oncology | Admitting: Hematology and Oncology

## 2017-10-15 ENCOUNTER — Encounter (HOSPITAL_COMMUNITY): Payer: Self-pay

## 2017-10-15 DIAGNOSIS — Z9012 Acquired absence of left breast and nipple: Secondary | ICD-10-CM | POA: Insufficient documentation

## 2017-10-15 DIAGNOSIS — Z9011 Acquired absence of right breast and nipple: Secondary | ICD-10-CM | POA: Diagnosis not present

## 2017-10-15 DIAGNOSIS — C50111 Malignant neoplasm of central portion of right female breast: Secondary | ICD-10-CM

## 2017-10-15 DIAGNOSIS — C3401 Malignant neoplasm of right main bronchus: Secondary | ICD-10-CM | POA: Insufficient documentation

## 2017-10-15 DIAGNOSIS — I700 Atherosclerosis of aorta: Secondary | ICD-10-CM | POA: Insufficient documentation

## 2017-10-15 DIAGNOSIS — C3490 Malignant neoplasm of unspecified part of unspecified bronchus or lung: Secondary | ICD-10-CM

## 2017-10-15 DIAGNOSIS — R918 Other nonspecific abnormal finding of lung field: Secondary | ICD-10-CM | POA: Diagnosis not present

## 2017-10-15 DIAGNOSIS — Z171 Estrogen receptor negative status [ER-]: Principal | ICD-10-CM

## 2017-10-15 LAB — COMPREHENSIVE METABOLIC PANEL
ALBUMIN: 4.1 g/dL (ref 3.5–5.0)
ALK PHOS: 51 U/L (ref 40–150)
ALT: 23 U/L (ref 0–55)
AST: 18 U/L (ref 5–34)
Anion Gap: 9 mEq/L (ref 3–11)
BUN: 25.6 mg/dL (ref 7.0–26.0)
CHLORIDE: 103 meq/L (ref 98–109)
CO2: 27 mEq/L (ref 22–29)
Calcium: 9.6 mg/dL (ref 8.4–10.4)
Creatinine: 0.9 mg/dL (ref 0.6–1.1)
Glucose: 121 mg/dl (ref 70–140)
POTASSIUM: 4 meq/L (ref 3.5–5.1)
SODIUM: 139 meq/L (ref 136–145)
Total Bilirubin: 0.68 mg/dL (ref 0.20–1.20)
Total Protein: 7.1 g/dL (ref 6.4–8.3)

## 2017-10-15 MED ORDER — IOPAMIDOL (ISOVUE-300) INJECTION 61%
75.0000 mL | Freq: Once | INTRAVENOUS | Status: AC | PRN
  Administered 2017-10-15: 75 mL via INTRAVENOUS

## 2017-10-15 MED ORDER — IOPAMIDOL (ISOVUE-300) INJECTION 61%
INTRAVENOUS | Status: AC
  Filled 2017-10-15: qty 75

## 2017-10-16 ENCOUNTER — Telehealth: Payer: Self-pay | Admitting: Hematology and Oncology

## 2017-10-16 ENCOUNTER — Ambulatory Visit (HOSPITAL_BASED_OUTPATIENT_CLINIC_OR_DEPARTMENT_OTHER): Payer: Medicare Other | Admitting: Hematology and Oncology

## 2017-10-16 DIAGNOSIS — Z86718 Personal history of other venous thrombosis and embolism: Secondary | ICD-10-CM | POA: Diagnosis not present

## 2017-10-16 DIAGNOSIS — Z853 Personal history of malignant neoplasm of breast: Secondary | ICD-10-CM

## 2017-10-16 DIAGNOSIS — Z171 Estrogen receptor negative status [ER-]: Principal | ICD-10-CM

## 2017-10-16 DIAGNOSIS — C50111 Malignant neoplasm of central portion of right female breast: Secondary | ICD-10-CM

## 2017-10-16 DIAGNOSIS — C3490 Malignant neoplasm of unspecified part of unspecified bronchus or lung: Secondary | ICD-10-CM

## 2017-10-16 MED ORDER — RIVAROXABAN 20 MG PO TABS: ORAL_TABLET | ORAL | 11 refills | Status: DC

## 2017-10-16 NOTE — Telephone Encounter (Signed)
Gave patient AVS. Patient declined calendar. Patient scheduled per 11/16 los.

## 2017-10-16 NOTE — Progress Notes (Signed)
Patient Care Team: Leighton Ruff, MD as PCP - General (Family Medicine)  DIAGNOSIS:  Encounter Diagnosis  Name Primary?  . Malignant neoplasm of central portion of right breast in female, estrogen receptor negative ([place])     SUMMARY OF ONCOLOGIC HISTORY:   Cancer of central portion of right female breast ([place])   07/14/2012 Mammogram    And invasive mammary carcinoma with lymphovascular invasion grade 3 ER negative PR negative HER-2 positive inflammatory breast cancer      07/19/2012 Breast MRI    Right breast neoplasm level I and right axillary lymph node compatible with lymph node metastases biopsy proven to be invasive mammary cancer      07/23/2012 - 12/17/2012 Neo-Adjuvant Chemotherapy    Neoadjuvant FEC 100 followed by Taxol Herceptin due to neuropathy Taxol was replaced by gemcitabine x5 cycles      12/07/2012 -  Hospital Admission    Bilateral lower extremity DVT, workup showed prothrombin gene mutation heterozygous and IVC filter, bilateral pulmonary emboli and acute respiratory failure requiring vent support bronchoscopy and BAL concerning for adenocarcinoma, PET/CT negative      02/08/2013 Surgery    Right mastectomy showed complete response to chemotherapy in the breast and 11 lymph nodes negative      02/25/2013 - 10/14/2013 Chemotherapy    Herceptin maintenance for one year      03/31/2013 - 05/17/2013 Radiation Therapy    Adjuvant radiation therapy       05/10/2014 Surgery    Left mastectomy      CHIEF COMPLIANT: Follow-up of breast cancer  INTERVAL HISTORY: Jessica Wells is a 71 year old with above-mentioned history of right breast cancer treated with neoadjuvant chemotherapy followed by right mastectomy and radiation.  Currently on surveillance and observation appears to be doing quite well.  She reports no pain or discomfort.  She has been doing very well denies any health issues or concerns.  REVIEW OF SYSTEMS:   Constitutional: Denies fevers, chills or  abnormal weight loss Eyes: Denies blurriness of vision Ears, nose, mouth, throat, and face: Denies mucositis or sore throat Respiratory: Denies cough, dyspnea or wheezes Cardiovascular: Denies palpitation, chest discomfort Gastrointestinal:  Denies nausea, heartburn or change in bowel habits Skin: Denies abnormal skin rashes Lymphatics: Denies new lymphadenopathy or easy bruising Neurological:Denies numbness, tingling or new weaknesses Behavioral/Psych: Mood is stable, no new changes  Extremities: No lower extremity edema Breast:  denies any pain or lumps or nodules in either breasts All other systems were reviewed with the patient and are negative.  I have reviewed the past medical history, past surgical history, social history and family history with the patient and they are unchanged from previous note.  ALLERGIES:  is allergic to anesthetics, amide; bactrim [sulfamethoxazole-trimethoprim]; sulfa antibiotics; codeine; codeine phosphate; and eggs or egg-derived products.  MEDICATIONS:  Current Outpatient Medications  Medication Sig Dispense Refill  . albuterol (PROAIR HFA) 108 (90 Base) MCG/ACT inhaler 4 Puffs every 4-6 hours as needed for cough or wheezing.  Use with spacer. 1 Inhaler 1  . atorvastatin (LIPITOR) 10 MG tablet Take 10 mg by mouth daily.    . B Complex-C (SUPER B COMPLEX PO) Take 1 tablet by mouth daily.    . budesonide-formoterol (SYMBICORT) 160-4.5 MCG/ACT inhaler Inhale 2 puffs into the lungs 2 (two) times daily. Use with spacer. 1 Inhaler 5  . calcium-vitamin D (OSCAL WITH D) 500-200 MG-UNIT per tablet Take 1 tablet by mouth every other day.     . fish oil-omega-3 fatty acids  1000 MG capsule Take 1 g by mouth daily.     . Flaxseed, Linseed, (FLAX SEEDS PO) Take 1 tablet by mouth daily.    . fluticasone (FLONASE) 50 MCG/ACT nasal spray Place 1 spray into both nostrils as needed for allergies or rhinitis.    Marland Kitchen glucosamine-chondroitin 500-400 MG tablet Take 1 tablet by  mouth daily.    Marland Kitchen lisinopril (PRINIVIL,ZESTRIL) 2.5 MG tablet Take 2.5 mg by mouth daily.    . metoprolol succinate (TOPROL-XL) 50 MG 24 hr tablet TAKE ONE TABLET BY MOUTH DAILY BEFORE BREAKFAST.TAKE WITH/OR IMMEDIATELY FOLLOWING A MEAL.Patient needs appt for further refills 1st attempt 30 tablet 0  . montelukast (SINGULAIR) 10 MG tablet TAKE 1 TABLET BY MOUTH ONCE DAILY 30 tablet 3  . Multiple Vitamin (MULTIVITAMIN WITH MINERALS) TABS Take 1 tablet by mouth every other day.     Marland Kitchen omeprazole (PRILOSEC) 20 MG capsule Take 20 mg by mouth daily. For heartburn    . vitamin C (ASCORBIC ACID) 500 MG tablet Take 500 mg by mouth daily.    . vitamin E 1000 UNIT capsule Take 1,000 Units by mouth daily.    Alveda Reasons 20 MG TABS tablet TAKE 1 TABLET BY MOUTH ONCE DAILY WITH SUPPER 30 tablet 2   No current facility-administered medications for this visit.     PHYSICAL EXAMINATION: ECOG PERFORMANCE STATUS: 1 - Symptomatic but completely ambulatory  Vitals:   10/16/17 1029  BP: (!) 160/71  Pulse: 96  Resp: 18  Temp: 98.1 F (36.7 C)  SpO2: 98%   Filed Weights   10/16/17 1029  Weight: 145 lb 4.8 oz (65.9 kg)    GENERAL:alert, no distress and comfortable SKIN: skin color, texture, turgor are normal, no rashes or significant lesions EYES: normal, Conjunctiva are pink and non-injected, sclera clear OROPHARYNX:no exudate, no erythema and lips, buccal mucosa, and tongue normal  NECK: supple, thyroid normal size, non-tender, without nodularity LYMPH:  no palpable lymphadenopathy in the cervical, axillary or inguinal LUNGS: clear to auscultation and percussion with normal breathing effort HEART: regular rate & rhythm and no murmurs and no lower extremity edema ABDOMEN:abdomen soft, non-tender and normal bowel sounds MUSCULOSKELETAL:no cyanosis of digits and no clubbing  NEURO: alert & oriented x 3 with fluent speech, no focal motor/sensory deficits EXTREMITIES: No lower extremity edema  LABORATORY  DATA:  I have reviewed the data as listed   Chemistry      Component Value Date/Time   NA 139 10/15/2017 0811   K 4.0 10/15/2017 0811   CL 105 05/11/2014 0438   CL 105 05/20/2013 1459   CO2 27 10/15/2017 0811   BUN 25.6 10/15/2017 0811   CREATININE 0.9 10/15/2017 0811      Component Value Date/Time   CALCIUM 9.6 10/15/2017 0811   ALKPHOS 51 10/15/2017 0811   AST 18 10/15/2017 0811   ALT 23 10/15/2017 0811   BILITOT 0.68 10/15/2017 0811       Lab Results  Component Value Date   WBC 6.3 10/10/2016   HGB 13.5 10/10/2016   HCT 40.0 10/10/2016   MCV 90.9 10/10/2016   PLT 148 10/10/2016   NEUTROABS 3.2 10/10/2016    ASSESSMENT & PLAN:  Cancer of central portion of right female breast Right breast inflammation breast cancer diagnosed August 2013 treated with neoadjuvant chemotherapy completed 12/17/2012 hospitalized for bilateral DVT and respiratory failure, bronchoscopy showed adenocarcinoma, right mastectomy 02/08/2013 followed by Herceptin maintenance completed 10/14/2013, adjuvant radiation completed 05/17/2013.  Lung adenocarcinoma: CT scans done  10/15/2017 negative for cancer, stable scattered 3-4 mm pulmonary nodules, unchanged  Breast Cancer Surveillance: 1. Chest wall exam 10/16/2017: Normal 2. Mammogram: No role of mammogram since she had bilateral mastectomies  Return to clinic in 1 year with annual CT scans of the chest.  I spent 25 minutes talking to the patient of which more than half was spent in counseling and coordination of care.  No orders of the defined types were placed in this encounter.  The patient has a good understanding of the overall plan. she agrees with it. she will call with any problems that may develop before the next visit here.   Rulon Eisenmenger, MD 10/16/17

## 2017-10-16 NOTE — Assessment & Plan Note (Signed)
Right breast inflammation breast cancer diagnosed August 2013 treated with neoadjuvant chemotherapy completed 12/17/2012 hospitalized for bilateral DVT and respiratory failure, bronchoscopy showed adenocarcinoma, right mastectomy 02/08/2013 followed by Herceptin maintenance completed 10/14/2013, adjuvant radiation completed 05/17/2013.  Lung adenocarcinoma: CT scans done 10/15/2017 negative for cancer, stable scattered 3-4 mm pulmonary nodules, unchanged  Breast Cancer Surveillance: 1. Chest wall exam 10/16/2017: Normal 2. Mammogram: No role of mammogram since she had bilateral mastectomies  Return to clinic in 1 year with annual CT scans of the chest.

## 2017-10-28 ENCOUNTER — Ambulatory Visit (INDEPENDENT_AMBULATORY_CARE_PROVIDER_SITE_OTHER): Payer: Medicare Other | Admitting: Cardiology

## 2017-10-28 ENCOUNTER — Encounter: Payer: Self-pay | Admitting: Cardiology

## 2017-10-28 VITALS — BP 110/70 | HR 70 | Ht 60.0 in | Wt 147.4 lb

## 2017-10-28 DIAGNOSIS — D689 Coagulation defect, unspecified: Secondary | ICD-10-CM

## 2017-10-28 DIAGNOSIS — I471 Supraventricular tachycardia: Secondary | ICD-10-CM

## 2017-10-28 NOTE — Patient Instructions (Signed)

## 2017-10-28 NOTE — Progress Notes (Signed)
[place]. [address], [place]  [address] Phone: [phone] Fax:  [phone]  Date:  10/28/2017   ID:  Jessica Wells, DOB 10-06-1946, MRN [id number]  PCP:  Leighton Ruff, MD   History of Present Illness: Jessica Wells is a 71 y.o. female with history of paroxysmal atrial tachycardia, DVT, inflammatory right-sided breast cancer on chronic anticoagulation, here for followup. Her last visit with me was 12/19/10. Was seeing Dr. Haroldine Laws. Toprol working well. Previously on Herceptin.  Had second mastectomy in 6/15. Dr. Donne Hazel.   Gets a mild fullness sensation as though she is going to start tachycardia but no long term symptoms.  2 skips only. Does not take off.   She occasionally will have right arm lymphedema. She knows when to start and she is good at wrapping her arms. 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  Wt Readings from Last 3 Encounters:  10/28/17 147 lb 6.4 oz (66.9 kg)  10/16/17 145 lb 4.8 oz (65.9 kg)  02/12/17 139 lb 12.8 oz (63.4 kg)     Past Medical History:  Diagnosis Date  . Allergy   . Anemia    "after chemo"  . Arthritis    "knees; fingers; occasionally" (05/10/2014)  . Asthma    Uses Inhalers Proventil as needed  . Breast cancer ([facility]) 07/14/12   inflammatory right breast ca, ER/PR -  . Bursitis    "both shoulders"  . Clotting disorder ([facility])    prothrombin gene mutation-heterozygous   . DVT (deep venous thrombosis) ([facility]) 2014   BLE  . Dysrhythmia    PAT-sees dr Lina Sayre meds  . GERD (gastroesophageal reflux disease)   . H/O hiatal hernia   . History of blood transfusion 01-25-13   2 units 01-11-13 ("after chem")  . History of chemotherapy    Last dose to be 02-04-13.  Was rx'd with Herceptin & Gemzar  . Lymphedema    RUE  . Osteopenia    "lower spine only" (05/10/2014)  . PAT (paroxysmal atrial tachycardia)  ([facility])    hx  . Pneumonia 01-25-13   hx. 12-27-12-hospital stay x 9 days, now resolved.  . Pneumonia    "episodic; all my life" (05/10/2014)  . PONV (postoperative nausea and vomiting) 09-13-12   severe, with Port-a-cath, was managed without PONV  . S/P radiation therapy 4-12 wks ago 03/31/13-05/17/13   right breast/supraclavicular fossa/posterior axillary boost    Past Surgical History:  Procedure Laterality Date  . BREAST BIOPSY Right 2013 X 3  . CHALAZION EXCISION Right 1980's   right( MD office)  . COLONOSCOPY    . DILATION AND CURETTAGE OF UTERUS  1998 X 3  . INSERTION OF VENA CAVA FILTER  12/2012  . MASTECTOMY Left 05/10/2014   PROPHYLACTIC   . MASTECTOMY MODIFIED RADICAL Right 02/08/2013   Procedure: MASTECTOMY MODIFIED RADICAL;  Surgeon: Rolm Bookbinder, MD;  Location: [facility];  Service: General;  Laterality: Right;  . MASTECTOMY MODIFIED RADICAL Right   . PORT-A-CATH REMOVAL  05/10/2014  . PORT-A-CATH REMOVAL N/A 05/10/2014   Procedure: REMOVAL PORT-A-CATH;  Surgeon: Rolm Bookbinder, MD;  Location: [facility];  Service: General;  Laterality: N/A;  . PORTACATH PLACEMENT  07/21/2012   Procedure: INSERTION PORT-A-CATH;  Surgeon: Rolm Bookbinder, MD;  Location: [facility];  Service: General;  Laterality: Left;/ Replacement done 10'13  .  SIMPLE MASTECTOMY WITH AXILLARY SENTINEL NODE BIOPSY Left 05/10/2014   Procedure: PROPHYLACTIC LEFT MASTECTOMY;  Surgeon: Rolm Bookbinder, MD;  Location: [facility];  Service: General;  Laterality: Left;  . TONSILLECTOMY    . TUBAL LIGATION  ~ 1974  . TUMOR EXCISION Right 1970   giant cell, off my forearm"   Xarelto 20 QD Toprol 50mg  Atorvastatin 10 QD Lisinopril 2.5 QD  Allergies:    Allergies  Allergen Reactions  . Anesthetics, Amide Nausea And Vomiting    Projectile vomitting-Nausea- with 24hrs of dry heaves. With any anesthetics  . Bactrim [Sulfamethoxazole-Trimethoprim] Other (See Comments)    Massive diarrhea, nausea vomiting,  platelets dropped, hemoglobin dropped, admitted to hospital  . Sulfa Antibiotics Nausea And Vomiting and Other (See Comments)    Crazy feeling in head Life threatening reaction, decreased blood counts  . Codeine Nausea And Vomiting  . Codeine Phosphate Nausea And Vomiting  . Eggs Or Egg-Derived Products Hives    Social History:  The patient  reports that she quit smoking about 38 years ago. Her smoking use included cigarettes. She has a 15.00 pack-year smoking history. she has never used smokeless tobacco. She reports that she does not drink alcohol or use drugs.   Family History  Problem Relation Age of Onset  . Diabetes Mother   . Heart disease Mother   . Hypertension Mother   . Hyperlipidemia Mother   . Breast cancer Mother 107       lobular breast cancer  . Heart disease Maternal Grandmother        died in her 49s from heart disease  . Lung cancer Father 58       adenocarcinoma  . Allergic rhinitis Father   . Asthma Father   . Cancer Paternal Uncle        dx in mid 45s with a cancer in the leg, died late 54s; grandpaternal half uncle  . Allergic rhinitis Sister   . Asthma Sister   . Heart disease Maternal Aunt   . Heart disease Maternal Uncle   . Allergic rhinitis Son   . Asthma Son   . Angioedema Neg Hx   . Eczema Neg Hx   . Immunodeficiency Neg Hx   . Urticaria Neg Hx     ROS:  Please see the history of present illness.   No syncope, no excessive palpitations. Recent sinus infection, taking albuterol. No chest pain.   All other systems reviewed and negative.   PHYSICAL EXAM: VS:  BP 110/70   Pulse 70   Ht 5' (1.524 m)   Wt 147 lb 6.4 oz (66.9 kg)   LMP  (LMP Unknown)   SpO2 97%   BMI 28.79 kg/m  Well nourished, well developed, in no acute distress  HEENT: normal, [place]/AT, EOMI Neck: no JVD, normal carotid upstroke, no bruit Cardiac:  normal S1, S2; RRR; no murmur  Lungs:  clear to auscultation bilaterally, no wheezing, rhonchi or rales  Abd: soft, nontender,  no hepatomegaly, no bruits  Ext: no edema, 2+ distal pulses Skin: warm and dry  GU: deferred Neuro: no focal abnormalities noted, AAO x 3  EKG:  Today 10/28/17-sinus rhythm 70 with no other ab 10/16/16-sinus rhythm, 75, no other abnormalities 09/17/15-sinus rhythm, 61, no other abnormalities, personally viewed-prior NSR 86.   ECHOS:  07/22/12 EF 60-65% lateral s' 10.5  10/2012 EF 60-65% lateral s' 10.7  01/19/13 EF 55-60% lateral S' 13.0 No evidence of RV strain.  03/21/13 EF 55-60% lateral s'  9.8 (remeasured by me today) Global Strain -16  06/20/13 EF 55-60% Lateral S' 9.3 Global Strain -24  09/21/13 EF 60% Lateral s' 9.9 Global strain -19%  ASSESSMENT AND PLAN:  1. Supraventricular tachycardia-PAT-doing very well. Continue with Toprol 50. She is enjoying this regimen. If they become more frequent, consider ablative therapy. We discussed this once again. Overall at this point, she is doing very well and we will not change any medications. Very low-dose lisinopril.  No changes in therapy. 2. Chronic anticoagulation-currently on Xarelto, IVC filter. Positive prothrombin gene mutation, heterozygous. Doing well. No bleeding.  She can always pose the question to oncology whether or not she would be able to come off of the Xarelto at any point.  Her risks of future thrombosis are increased with prothrombin gene mutation, however her her bilateral DVTs were also in the setting of underlying cancer which is now in remission.   3. Breast cancer-recently saw Dr. Donne Hazel. Doing well.  No changes, right-sided. 4. History of pulmonary embolism-IVC filter 12/2011 in the setting of PE and DVT 5.  hyperlipidemia-atorvastatin 10 mg. Doing well. Dr. Drema Dallas has been monitoring. 6. We will see back in 12 months.  Signed, Candee Furbish, MD [facility]  10/28/2017 12:15 PM

## 2017-10-30 ENCOUNTER — Other Ambulatory Visit: Payer: Self-pay | Admitting: Cardiology

## 2017-10-30 MED ORDER — METOPROLOL SUCCINATE ER 50 MG PO TB24: ORAL_TABLET | ORAL | 3 refills | Status: DC

## 2017-12-04 ENCOUNTER — Other Ambulatory Visit: Payer: Self-pay | Admitting: Allergy & Immunology

## 2017-12-24 ENCOUNTER — Ambulatory Visit (INDEPENDENT_AMBULATORY_CARE_PROVIDER_SITE_OTHER): Payer: Medicare Other | Admitting: Allergy & Immunology

## 2017-12-24 ENCOUNTER — Encounter: Payer: Self-pay | Admitting: Allergy & Immunology

## 2017-12-24 VITALS — BP 120/66 | HR 60 | Resp 16

## 2017-12-24 DIAGNOSIS — J4540 Moderate persistent asthma, uncomplicated: Secondary | ICD-10-CM

## 2017-12-24 DIAGNOSIS — J302 Other seasonal allergic rhinitis: Secondary | ICD-10-CM | POA: Diagnosis not present

## 2017-12-24 DIAGNOSIS — J3089 Other allergic rhinitis: Secondary | ICD-10-CM | POA: Diagnosis not present

## 2017-12-24 MED ORDER — EPINEPHRINE 0.3 MG/0.3ML IJ SOAJ: INTRAMUSCULAR | 1 refills | Status: DC

## 2017-12-24 NOTE — Patient Instructions (Addendum)
1. Mild persistent asthma, uncomplicated - Daily controller medication(s): Singulair 10mg  daily and Symbicort 160/4.21mcg two puffs twice daily with spacer - Prior to physical activity: ProAir 2 puffs 10-15 minutes before physical activity. - Rescue medications: ProAir 4 puffs every 4-6 hours as needed - Asthma control goals:  * Full participation in all desired activities (may need albuterol before activity) * Albuterol use two time or less a week on average (not counting use with activity) * Cough interfering with sleep two time or less a month * Oral steroids no more than once a year * No hospitalizations  2. Chronic allergic rhinitis (grasses, weeds, molds, dust mite, cat, dog, feathers, horse, cockroach) - Continue with Flonase 2 sprays per nostril daily. - Continue with antihistamines daily as needed. - We will consider starting allergy shots in the future.  3. Intense pruritis - with a remote history of anaphylaxis - I agree with your plan to limit your diet to see if this changes anything. - Peanut has been positive in the past, although very low. - Egg and wheat have also been positive.  - The most common allergens include peanut, tree nut, soy, fish, shellfish, wheat, milk, and egg.  - We will regroup in three months to see how you are doing.  - We will get testing to look for seafood allergies, soy allergies, tree nut allergies, milk allergies, and alpha-gal (red meat) allergy.  - In the interim, we will send in an EpiPen to have on hand if needed.   4. Return in about 3 months (around 03/24/2018).   Please inform us of any Emergency Department visits, hospitalizations, or changes in symptoms. Call us before going to the ED for breathing or allergy symptoms since we might be able to fit you in for a sick visit. Feel free to contact us anytime with any questions, problems, or concerns.  It was a pleasure to see you again today! Happy New Year!   Websites that have reliable  patient information: 1. American Academy of Asthma, Allergy, and Immunology: www.aaaai.org 2. Food Allergy Research and Education (FARE): foodallergy.org 3. Mothers of Asthmatics: http://www.asthmacommunitynetwork.org 4. American College of Allergy, Asthma, and Immunology: www.acaai.org

## 2017-12-24 NOTE — Progress Notes (Signed)
FOLLOW UP  Date of Service/Encounter:  12/24/17   Assessment:   Moderate persistent asthma, uncomplicated  Seasonal and perennial allergic rhinitis  Rash - ? eczema  Adverse food reactions - with some low positive IgE results  History of breast cancer - with recent clean scan (November 2018)  Plan/Recommendations:   1. Mild persistent asthma, uncomplicated - Daily controller medication(s): Singulair 10mg  daily and Symbicort 160/4.46mcg two puffs twice daily with spacer - Prior to physical activity: ProAir 2 puffs 10-15 minutes before physical activity. - Rescue medications: ProAir 4 puffs every 4-6 hours as needed - Asthma control goals:  * Full participation in all desired activities (may need albuterol before activity) * Albuterol use two time or less a week on average (not counting use with activity) * Cough interfering with sleep two time or less a month * Oral steroids no more than once a year * No hospitalizations  2. Chronic allergic rhinitis (grasses, weeds, molds, dust mite, cat, dog, feathers, horse, cockroach) - Continue with Flonase 2 sprays per nostril daily. - Continue with antihistamines daily as needed. - We will consider starting allergy shots in the future.  3. Intense pruritis - with a remote history of anaphylaxis to egg - I agree with your plan to limit your diet to see if this changes anything. - Peanut has been positive in the past, although very low. - Egg and wheat have also been positive.   - The most common allergens include peanut, tree nut, soy, fish, shellfish, wheat, milk, and egg.  - We will regroup in three months to see how you are doing.  - We will get testing to look for seafood allergies, soy allergies, tree nut allergies, milk allergies, and alpha-gal (red meat) allergy.  - In the interim, we will send in an EpiPen to have on hand if needed.   4. Return in about 3 months (around 03/24/2018).  Subjective:   Jessica Wells is a 72  y.o. female presenting today for follow up of  Chief Complaint  Patient presents with  . Asthma  . Allergic Rhinitis     tching sneezing    Flara Storti has a history of the following: Patient Active Problem List   Diagnosis Date Noted  . Moderate persistent asthma, uncomplicated 71/05/2693  . Seasonal and perennial allergic rhinitis 09/17/2017  . S/P mastectomy 05/10/2014  . Cancer of central portion of right female breast ([facility]) 09/23/2013  . Pulmonary infiltrates 01/03/2013  . Acute respiratory failure with hypoxia ([facility]) 12/29/2012  . Hypokalemia 12/29/2012  . Pulmonary embolism ([facility] [facility]) 12/28/2012  . Healthcare-associated pneumonia 12/27/2012  . Acute respiratory distress 12/27/2012  . Asthma   . GERD (gastroesophageal reflux disease)   . Dysrhythmia   . Allergy   . Arthritis   . Clotting disorder ([place])   . History of chemotherapy   . DVT (deep venous thrombosis) ([facility])   . DVT of lower extremity, bilateral ([facility]) 12/22/2012  . PSVT (paroxysmal supraventricular tachycardia) ([facility]) 10/14/2012  . PONV (postoperative nausea and vomiting) 09/13/2012  . PAT (paroxysmal atrial tachycardia) ([place])     History obtained from: chart review and patient.  Jessica Wells Primary Care Provider is Leighton Ruff, MD.      Jessica Wells is a 72 y.o. female presenting for a follow up visit. [name] was last seen in October 2018. At that time, her lung testing looked quite good. We continued her on Singulair 10mg  daily and Symbicort 160/4.5 two puffs BID. Her P/SAR was controlled  with fluticasone two sprays per nostril daily as well as her daily antihistamines. We had discussed starting allergy shots, but she was well controlled. Her last testing was controlled in March 2018 and was positive to grasses, weeds, molds, dust mite, cat, dog, feathers, horse, and cockroach.   Since the last visit, she has mostly done well. She has actually done very well on her regimen. Around three weeks ago, she  developed intense oral and nasal itching. Itching has always been her hallmark of her atopic picture. This became so bad that she even developed a bad rash on her upper anterior chest. She continues to have some intense scratching. She remains on Xyzal one tablet once daily, but she has not increased her dosing at all to see if that helps. She denies new exposures, but she has eaten peanuts more over the winter. She continues to eat wheat products. She is eating eggs; we had previously discussed doing an office challenge. She does eat a lot of hazelnuts.   Evidently at some point she had positive testing to barley, but this is not a main staple of her diet. She is interested in getting all of the common food allergens in her diet and seeing if this helps her rash and itching. She does tolerate [place] yogurt and cheese without problems. She does not like dairy.  Of note, she does have a remote history of full body hives, which lasted 2-3 weeks at each time. She never got to the bottom of this at all. This was around 30 years ago. She does not have an EpiPen at this time. She is unsure whether she needs one and frankly would prefer to avoid this entirely. We have never done skin testing to all of the foods on our panel, instead performing sIgE testing to specific foods.   Otherwise, there have been no changes to her past medical history, surgical history, family history, or social history. She had a clean CT in November 2018 from her invasive metastatic breast cancer. She sees her oncologist every 6 months and her surgeon every 6 months. 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 She is also active with the [employer].     Review of Systems: a 14-point review of systems is pertinent for what is mentioned in HPI.  Otherwise, all other systems were negative. Constitutional: negative other than that listed in the HPI Eyes: negative other than that listed in  the HPI Ears, nose, mouth, throat, and face: negative other than that listed in the HPI Respiratory: negative other than that listed in the HPI Cardiovascular: negative other than that listed in the HPI Gastrointestinal: negative other than that listed in the HPI Genitourinary: negative other than that listed in the HPI Integument: negative other than that listed in the HPI Hematologic: negative other than that listed in the HPI Musculoskeletal: negative other than that listed in the HPI Neurological: negative other than that listed in the HPI Allergy/Immunologic: negative other than that listed in the HPI    Objective:   Blood pressure 120/66, pulse 60, resp. rate 16. There is no height or weight on file to calculate BMI.   Physical Exam:  General: Alert, interactive, in no acute distress. Very pleasant female as always.  Eyes: No conjunctival injection bilaterally, no discharge on the right, no discharge on the left and allergic shiners present bilaterally. PERRL bilaterally. EOMI without pain. No photophobia.  Ears: Right TM pearly gray with  normal light reflex, Left TM pearly gray with normal light reflex, Right TM intact without perforation and Left TM intact without perforation.  Nose/Throat: External nose within normal limits and septum midline. Turbinates edematous and pale with clear discharge. Posterior oropharynx erythematous without cobblestoning in the posterior oropharynx. Tonsils 2+ without exudates.  Tongue without thrush. Adenopathy: no enlarged lymph nodes appreciated in the anterior cervical, occipital, axillary, epitrochlear, inguinal, or popliteal regions. Lungs: Clear to auscultation without wheezing, rhonchi or rales. No increased work of breathing. CV: Normal S1/S2. No murmurs. Capillary refill <2 seconds.  Skin: Warm and dry, without lesions or rashes. Neuro:   Grossly intact. No focal deficits appreciated. Responsive to questions.  Diagnostic studies:    Spirometry: results normal (FEV1: 1.74/96%, FVC: 2.06/85%, FEV1/FVC: 84%).    Spirometry consistent with normal pattern.   Allergy Studies: none      Salvatore Marvel, MD [facility]

## 2017-12-29 LAB — TRYPTASE: Tryptase: 8.4 ug/L (ref 2.2–13.2)

## 2017-12-29 LAB — ALLERGY PANEL 18, NUT MIX GROUP
ALLERGEN COCONUT IGE: 0.19 kU/L — AB
F020-IGE ALMOND: 0.41 kU/L — AB
F202-IGE CASHEW NUT: 0.3 kU/L — AB
HAZELNUT (FILBERT) IGE: 0.68 kU/L — AB
Peanut IgE: 0.66 kU/L — AB
Sesame Seed IgE: 0.78 kU/L — AB

## 2017-12-29 LAB — ALPHA-GAL PANEL
ALPHA GAL IGE: 16.4 kU/L — AB (ref <0.10)
Beef (Bos spp) IgE: 10.7 kU/L — ABNORMAL HIGH (ref <0.35)
Class Interpretation: 3
Class Interpretation: 3
LAMB CLASS INTERPRETATION: 3
Lamb/Mutton (Ovis spp) IgE: 4.45 kU/L — ABNORMAL HIGH (ref <0.35)
Pork (Sus spp) IgE: 7.08 kU/L — ABNORMAL HIGH (ref <0.35)

## 2017-12-29 LAB — ALLERGY PANEL 19, SEAFOOD GROUP
Allergen Salmon IgE: <0.1 kU/L
Codfish IgE: <0.1 kU/L
F023-IgE Crab: <0.1 kU/L
F080-IgE Lobster: <0.1 kU/L
Tuna: 0.26 kU/L — AB

## 2017-12-29 LAB — MILK COMPONENT PANEL
F076-IgE Alpha Lactalbumin: <0.1 kU/L
F077-IGE BETA LACTOGLOBULIN: 0.56 kU/L — AB
F078-IGE CASEIN: 0.31 kU/L — AB

## 2017-12-29 LAB — ALLERGEN SOYBEAN: SOYBEAN IGE: 0.72 kU/L — AB

## 2018-01-01 ENCOUNTER — Telehealth: Payer: Self-pay

## 2018-01-01 NOTE — Telephone Encounter (Signed)
Pt. Got your message she did have some other questions regarding dairy. Pt. Will be waiting for your call at [phone].

## 2018-01-05 ENCOUNTER — Other Ambulatory Visit: Payer: Self-pay | Admitting: Allergy & Immunology

## 2018-01-05 NOTE — Telephone Encounter (Signed)
Returned call and LVM asking her to call us back to address her questions.  Salvatore Marvel, MD [facility]

## 2018-01-21 DIAGNOSIS — E7800 Pure hypercholesterolemia, unspecified: Secondary | ICD-10-CM | POA: Diagnosis not present

## 2018-01-21 DIAGNOSIS — Z91018 Allergy to other foods: Secondary | ICD-10-CM | POA: Diagnosis not present

## 2018-01-21 DIAGNOSIS — Z9013 Acquired absence of bilateral breasts and nipples: Secondary | ICD-10-CM | POA: Diagnosis not present

## 2018-01-21 DIAGNOSIS — E119 Type 2 diabetes mellitus without complications: Secondary | ICD-10-CM | POA: Diagnosis not present

## 2018-01-21 DIAGNOSIS — Z853 Personal history of malignant neoplasm of breast: Secondary | ICD-10-CM | POA: Diagnosis not present

## 2018-01-21 DIAGNOSIS — J309 Allergic rhinitis, unspecified: Secondary | ICD-10-CM | POA: Diagnosis not present

## 2018-03-15 DIAGNOSIS — C50911 Malignant neoplasm of unspecified site of right female breast: Secondary | ICD-10-CM | POA: Diagnosis not present

## 2018-04-21 ENCOUNTER — Other Ambulatory Visit: Payer: Self-pay | Admitting: Allergy & Immunology

## 2018-04-23 ENCOUNTER — Other Ambulatory Visit: Payer: Self-pay | Admitting: Allergy & Immunology

## 2018-04-23 NOTE — Telephone Encounter (Signed)
RF on Symbicort 160 mcg x 1 with 1 refill at [facility].

## 2018-07-02 ENCOUNTER — Other Ambulatory Visit: Payer: Self-pay | Admitting: Allergy & Immunology

## 2018-07-30 ENCOUNTER — Other Ambulatory Visit: Payer: Self-pay | Admitting: Allergy & Immunology

## 2018-07-30 ENCOUNTER — Telehealth: Payer: Self-pay | Admitting: Allergy & Immunology

## 2018-07-30 ENCOUNTER — Other Ambulatory Visit: Payer: Self-pay

## 2018-07-30 ENCOUNTER — Other Ambulatory Visit: Payer: Self-pay | Admitting: Cardiology

## 2018-07-30 MED ORDER — MONTELUKAST SODIUM 10 MG PO TABS: ORAL_TABLET | ORAL | 0 refills | Status: DC

## 2018-07-30 NOTE — Telephone Encounter (Signed)
Patient called and made an appt for 08-18-18 and is requesting a refill for her montelukast. [facility] on [address].

## 2018-07-30 NOTE — Telephone Encounter (Signed)
Sent in a courtesy refill for montelukast, her fuv is 08/2018

## 2018-08-10 DIAGNOSIS — Z9013 Acquired absence of bilateral breasts and nipples: Secondary | ICD-10-CM | POA: Diagnosis not present

## 2018-08-10 DIAGNOSIS — E7800 Pure hypercholesterolemia, unspecified: Secondary | ICD-10-CM | POA: Diagnosis not present

## 2018-08-10 DIAGNOSIS — M8580 Other specified disorders of bone density and structure, unspecified site: Secondary | ICD-10-CM | POA: Diagnosis not present

## 2018-08-10 DIAGNOSIS — Z853 Personal history of malignant neoplasm of breast: Secondary | ICD-10-CM | POA: Diagnosis not present

## 2018-08-10 DIAGNOSIS — Z86711 Personal history of pulmonary embolism: Secondary | ICD-10-CM | POA: Diagnosis not present

## 2018-08-10 DIAGNOSIS — I471 Supraventricular tachycardia: Secondary | ICD-10-CM | POA: Diagnosis not present

## 2018-08-10 DIAGNOSIS — E119 Type 2 diabetes mellitus without complications: Secondary | ICD-10-CM | POA: Diagnosis not present

## 2018-08-10 DIAGNOSIS — Z91018 Allergy to other foods: Secondary | ICD-10-CM | POA: Diagnosis not present

## 2018-08-10 DIAGNOSIS — G620 Drug-induced polyneuropathy: Secondary | ICD-10-CM | POA: Diagnosis not present

## 2018-08-18 ENCOUNTER — Encounter: Payer: Self-pay | Admitting: Allergy & Immunology

## 2018-08-18 ENCOUNTER — Ambulatory Visit (INDEPENDENT_AMBULATORY_CARE_PROVIDER_SITE_OTHER): Payer: Medicare Other | Admitting: Allergy & Immunology

## 2018-08-18 VITALS — BP 102/68 | HR 62 | Resp 14 | Ht 61.0 in | Wt 147.0 lb

## 2018-08-18 DIAGNOSIS — J3089 Other allergic rhinitis: Secondary | ICD-10-CM | POA: Diagnosis not present

## 2018-08-18 DIAGNOSIS — J4540 Moderate persistent asthma, uncomplicated: Secondary | ICD-10-CM

## 2018-08-18 DIAGNOSIS — T781XXD Other adverse food reactions, not elsewhere classified, subsequent encounter: Secondary | ICD-10-CM

## 2018-08-18 DIAGNOSIS — J302 Other seasonal allergic rhinitis: Secondary | ICD-10-CM | POA: Diagnosis not present

## 2018-08-18 MED ORDER — BUDESONIDE-FORMOTEROL FUMARATE 160-4.5 MCG/ACT IN AERO: INHALATION_SPRAY | RESPIRATORY_TRACT | 5 refills | Status: DC

## 2018-08-18 MED ORDER — ALBUTEROL SULFATE HFA 108 (90 BASE) MCG/ACT IN AERS: INHALATION_SPRAY | RESPIRATORY_TRACT | 1 refills | Status: DC

## 2018-08-18 MED ORDER — FLUTICASONE PROPIONATE 50 MCG/ACT NA SUSP: 1.0000 | NASAL | 5 refills | Status: AC | PRN

## 2018-08-18 MED ORDER — OMEPRAZOLE 20 MG PO CPDR: 20.0000 mg | DELAYED_RELEASE_CAPSULE | Freq: Every day | ORAL | 5 refills | Status: DC

## 2018-08-18 MED ORDER — EPINEPHRINE 0.3 MG/0.3ML IJ SOAJ: INTRAMUSCULAR | 1 refills | Status: DC

## 2018-08-18 NOTE — Progress Notes (Signed)
FOLLOW UP  Date of Service/Encounter:  08/18/18   Assessment:   Moderate persistent asthma, uncomplicated  Seasonal and perennial allergic rhinitis  Adverse food reactions - with some low positive IgE results   Alpha-gal sensitivity - with an IgE 16.40  History of breast cancer (nearly 6 years clean) - with recent clean scan (November 2018)  Plan/Recommendations:   1. Mild persistent asthma, uncomplicated - Lung testing looks great today.  - We will stop Singulair (montelukast) and you should know in one week whether you are doing worse without it.  - Daily controller medication(s): Symbicort 160/4.5 one puff once daily (increasing to two twice daily during flares) - Prior to physical activity: ProAir 2 puffs 10-15 minutes before physical activity. - Rescue medications: ProAir 4 puffs every 4-6 hours as needed - Asthma control goals:  * Full participation in all desired activities (may need albuterol before activity) * Albuterol use two time or less a week on average (not counting use with activity) * Cough interfering with sleep two time or less a month * Oral steroids no more than once a year * No hospitalizations  2. Chronic allergic rhinitis (grasses, weeds, molds, dust mite, cat, dog, feathers, horse, cockroach) - Continue with Flonase 1 spray per nostril daily. - Continue with antihistamines daily as needed. - We will consider starting allergy shots in the future.  3. Intense pruritis - with alpha gal sensitiviyt - Continue to avoid red meat as you are doing.  - Continue with avoidance of your other triggering foods.  - EpiPen refilled.   4. Return in about 6 months (around 02/16/2019). Come back in one month for the flu shot!   Subjective:   Jessica Wells is a 72 y.o. female presenting today for follow up of  Chief Complaint  Patient presents with  . Asthma    Jessica Wells has a history of the following: Patient Active Problem List   Diagnosis Date  Noted  . Moderate persistent asthma, uncomplicated 76/81/1572  . Seasonal and perennial allergic rhinitis 09/17/2017  . S/P mastectomy 05/10/2014  . Cancer of central portion of right female breast ([place]) 09/23/2013  . Pulmonary infiltrates 01/03/2013  . Acute respiratory failure with hypoxia ([place]) 12/29/2012  . Hypokalemia 12/29/2012  . Pulmonary embolism ([place]) 12/28/2012  . Healthcare-associated pneumonia 12/27/2012  . Acute respiratory distress 12/27/2012  . Asthma   . GERD (gastroesophageal reflux disease)   . Dysrhythmia   . Allergy   . Arthritis   . Clotting disorder ([place])   . History of chemotherapy   . DVT (deep venous thrombosis) ([place])   . DVT of lower extremity, bilateral ([place]) 12/22/2012  . PSVT (paroxysmal supraventricular tachycardia) ([place]) 10/14/2012  . PONV (postoperative nausea and vomiting) 09/13/2012  . PAT (paroxysmal atrial tachycardia) ([place])     History obtained from: chart review and patient.  Jessica Wells Primary Care Provider is Jessica Ruff, MD.     Jessica Wells is a 72 y.o. female presenting for a follow up visit.  She was last seen in January 2019.  At that time, her asthma was under good control with Singulair and Symbicort 160/4.5 mcg 2 puffs twice daily.  She has a history of chronic allergic rhinitis with sensitizations to grasses, weeds, molds, dust mite, cat, dog, feathers, horse, cockroach.  We continued Flonase 2 sprays per nostril daily as well as antihistamines daily.  We have discussed starting allergy shots.  She does have a history of intense pruritus with multiple low positive IgE  results to foods.  At the last visit, she felt that she had everything under control.  We did get some labs to look for a seafood, soy, tree nut, milk, and alpha gal allergy.  Testing showed a very high IgE to alpha gal as well as all of the mammalian meats.  She also had low IgE levels to milk, soy, tuna, cashew, coconut, almond, hazelnut, peanut, and sesame seed.  They  were not elevated enough to have me worried about allergic reaction, but I did tell her to avoid to see if it helps with the itching.  Since the last visit, she has done very well. Her itching has been well controlled with avoidance of the red meats.  She feels that she has had alpha gal for 20 to 30 years, as she has had intermittent episodes of hives over her entire body and intense itching.  She has not felt this good in decades.  Asthma/Respiratory Symptom History: She remains on the Symbicort 160/4.5 mcg 1 puff once daily.  She does know that she can increase up to 2 puffs twice daily.  She likes that this affords her some flexibility with regards to her dosing.  ACT score today is 23, indicating excellent asthma control.  She has required no prednisone burst, ER visits, or urgent care visits for her asthma.  She remains on the montelukast 10 mg daily and has not tried getting off of this.  Allergic Rhinitis Symptom History: She continues to remain active in the outdoors.  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  She is on Flonase 1 spray per nostril daily.  She is on the montelukast as well.  She does not use an antihistamine on a daily basis.  Food Allergy Symptom History: She is avoiding all red meat. She did try a hamburger and had a resulting rash and swelling of her face. She will have some cheese occasionally. She does not do milk products otherwise.  She did add pecans back into her diet as well as eggs.  She otherwise avoids all of the other tree nuts.  Otherwise, there have been no changes to her past medical history, surgical history, family history, or social history.    Review of Systems: a 14-point review of systems is pertinent for what is mentioned in HPI.  Otherwise, all other systems were negative. Constitutional: negative other than that listed in the HPI Eyes: negative other than that listed in the  HPI Ears, nose, mouth, throat, and face: negative other than that listed in the HPI Respiratory: negative other than that listed in the HPI Cardiovascular: negative other than that listed in the HPI Gastrointestinal: negative other than that listed in the HPI Genitourinary: negative other than that listed in the HPI Integument: negative other than that listed in the HPI Hematologic: negative other than that listed in the HPI Musculoskeletal: negative other than that listed in the HPI Neurological: negative other than that listed in the HPI Allergy/Immunologic: negative other than that listed in the HPI    Objective:   Blood pressure 102/68, pulse 62, resp. rate 14, height 5\' 1"  (1.549 m), weight 147 lb (66.7 kg). Body mass index is 27.78 kg/m.   Physical Exam:  General: Alert, interactive, in no acute distress.  Very pleasant talkative female. Eyes: No conjunctival injection bilaterally, no discharge on the right, no discharge on the left and no Horner-Trantas dots present. PERRL bilaterally.  EOMI without pain. No photophobia.  Ears: Right TM pearly gray with normal light reflex, Left TM pearly gray with normal light reflex, Right TM intact without perforation and Left TM intact without perforation.  Nose/Throat: External nose within normal limits and septum midline. Turbinates edematous and pale with clear discharge. Posterior oropharynx erythematous without cobblestoning in the posterior oropharynx. Tonsils 2+ without exudates.  Tongue without thrush. Lungs: Mildly decreased breath sounds bilaterally without wheezing, rhonchi or rales. No increased work of breathing. CV: Normal S1/S2. No murmurs. Capillary refill <2 seconds.  Skin: Warm and dry, without lesions or rashes. Neuro:   Grossly intact. No focal deficits appreciated. Responsive to questions.  Diagnostic studies:   Spirometry: results normal (FEV1: 1.74/92%, FVC: 2.17/86%, FEV1/FVC: 80%).    Spirometry consistent with  normal pattern.   Allergy Studies: none    Salvatore Marvel, MD  [facility]

## 2018-08-18 NOTE — Patient Instructions (Addendum)
1. Mild persistent asthma, uncomplicated - Lung testing looks great today.  - We will stop Singulair (montelukast) and you should know in one week whether you are doing worse without it.  - Daily controller medication(s): Symbicort 160/4.5 one puff once daily (increasing to two twice daily during flares) - Prior to physical activity: ProAir 2 puffs 10-15 minutes before physical activity. - Rescue medications: ProAir 4 puffs every 4-6 hours as needed - Asthma control goals:  * Full participation in all desired activities (may need albuterol before activity) * Albuterol use two time or less a week on average (not counting use with activity) * Cough interfering with sleep two time or less a month * Oral steroids no more than once a year * No hospitalizations  2. Chronic allergic rhinitis (grasses, weeds, molds, dust mite, cat, dog, feathers, horse, cockroach) - Continue with Flonase 1 spray per nostril daily. - Continue with antihistamines daily as needed. - We will consider starting allergy shots in the future.  3. Intense pruritis - with alpha gal sensitiviyt - Continue to avoid red meat as you are doing.  - Continue with avoidance of your other triggering foods.  - EpiPen refilled.   4. Return in about 6 months (around 02/16/2019). Come back in one month for the flu shot!    Please inform us of any Emergency Department visits, hospitalizations, or changes in symptoms. Call us before going to the ED for breathing or allergy symptoms since we might be able to fit you in for a sick visit. Feel free to contact us anytime with any questions, problems, or concerns.  It was a pleasure to see you again today! Email me with the dates of your other presentations ([email or web address])! I would love to go listen to you!   Websites that have reliable patient information: 1. American Academy of Asthma, Allergy, and Immunology: www.aaaai.org 2. Food Allergy Research and Education (FARE):  foodallergy.org 3. Mothers of Asthmatics: http://www.asthmacommunitynetwork.org 4. American College of Allergy, Asthma, and Immunology: www.acaai.org

## 2018-08-18 NOTE — Addendum Note (Signed)
Addended by: Herbie Drape on: 08/18/2018 03:28 PM   Modules accepted: Orders

## 2018-09-14 DIAGNOSIS — M8589 Other specified disorders of bone density and structure, multiple sites: Secondary | ICD-10-CM | POA: Diagnosis not present

## 2018-09-15 ENCOUNTER — Other Ambulatory Visit: Payer: Self-pay

## 2018-09-15 ENCOUNTER — Emergency Department (HOSPITAL_COMMUNITY)
Admission: EM | Admit: 2018-09-15 | Discharge: 2018-09-15 | Disposition: A | Discharge status: Home / Self Care | Payer: Medicare Other | Attending: Emergency Medicine | Admitting: Emergency Medicine

## 2018-09-15 ENCOUNTER — Encounter (HOSPITAL_COMMUNITY): Payer: Self-pay | Admitting: Emergency Medicine

## 2018-09-15 DIAGNOSIS — J45909 Unspecified asthma, uncomplicated: Secondary | ICD-10-CM | POA: Insufficient documentation

## 2018-09-15 DIAGNOSIS — Z87891 Personal history of nicotine dependence: Secondary | ICD-10-CM | POA: Insufficient documentation

## 2018-09-15 DIAGNOSIS — R220 Localized swelling, mass and lump, head: Secondary | ICD-10-CM

## 2018-09-15 DIAGNOSIS — Z853 Personal history of malignant neoplasm of breast: Secondary | ICD-10-CM | POA: Diagnosis not present

## 2018-09-15 DIAGNOSIS — R600 Localized edema: Secondary | ICD-10-CM | POA: Diagnosis not present

## 2018-09-15 DIAGNOSIS — I10 Essential (primary) hypertension: Secondary | ICD-10-CM | POA: Insufficient documentation

## 2018-09-15 LAB — BASIC METABOLIC PANEL
Anion gap: 8 (ref 5–15)
BUN: 34 mg/dL — AB (ref 8–23)
CALCIUM: 9.5 mg/dL (ref 8.9–10.3)
CO2: 29 mmol/L (ref 22–32)
CREATININE: 1.03 mg/dL — AB (ref 0.44–1.00)
Chloride: 103 mmol/L (ref 98–111)
GFR calc Af Amer: >60 mL/min (ref >60)
GFR, EST NON AFRICAN AMERICAN: 53 mL/min — AB (ref >60)
GLUCOSE: 150 mg/dL — AB (ref 70–99)
Potassium: 4.1 mmol/L (ref 3.5–5.1)
Sodium: 140 mmol/L (ref 135–145)

## 2018-09-15 LAB — CBC WITH DIFFERENTIAL/PLATELET
Abs Immature Granulocytes: 0.03 10*3/uL (ref 0.00–0.07)
Basophils Absolute: 0 10*3/uL (ref 0.0–0.1)
Basophils Relative: 0 %
EOS ABS: 0.2 10*3/uL (ref 0.0–0.5)
EOS PCT: 2 %
HEMATOCRIT: 42 % (ref 36.0–46.0)
Hemoglobin: 13.8 g/dL (ref 12.0–15.0)
IMMATURE GRANULOCYTES: 0 %
LYMPHS ABS: 1.8 10*3/uL (ref 0.7–4.0)
Lymphocytes Relative: 19 %
MCH: 30.8 pg (ref 26.0–34.0)
MCHC: 32.9 g/dL (ref 30.0–36.0)
MCV: 93.8 fL (ref 80.0–100.0)
MONO ABS: 0.5 10*3/uL (ref 0.1–1.0)
MONOS PCT: 6 %
Neutro Abs: 6.8 10*3/uL (ref 1.7–7.7)
Neutrophils Relative %: 73 %
Platelets: 168 10*3/uL (ref 150–400)
RBC: 4.48 MIL/uL (ref 3.87–5.11)
RDW: 12.1 % (ref 11.5–15.5)
WBC: 9.4 10*3/uL (ref 4.0–10.5)
nRBC: 0 % (ref 0.0–0.2)

## 2018-09-15 NOTE — Discharge Instructions (Signed)
You were seen in the ED with likely salivary gland stone. You can suck on sour candies and apply a warm compress. Call the ENT doctor listed for further evaluation if your symptoms resolve.   Return to the ED with any worsening swelling, voice changes, fever, or difficulty swallowing.

## 2018-09-15 NOTE — ED Triage Notes (Signed)
Patient c/o right jaw pain and swelling today. Denies SOB, throat swelling.

## 2018-09-15 NOTE — ED Provider Notes (Signed)
Emergency Department Provider Note   I have reviewed the triage vital signs and the nursing notes.   HISTORY  Chief Complaint Facial Swelling   HPI Jessica Wells is a 72 y.o. female with PMH of asthma, GERD, and breast cancer presents to the emergency department for evaluation of sudden onset right lateral face swelling and pain. Patient was eating dinner when symptoms began suddenly. She denies any appreciable swelling in the mouth or throat. No SOB or difficulty swallowing. No fever or chills. No rash. Pain and swelling are improved slightly.    Past Medical History:  Diagnosis Date  . Allergy   . Anemia    "after chemo"  . Arthritis    "knees; fingers; occasionally" (05/10/2014)  . Asthma    Uses Inhalers Proventil as needed  . Breast cancer ([facility]) 07/14/12   inflammatory right breast ca, ER/PR -  . Bursitis    "both shoulders"  . Clotting disorder ([place])    prothrombin gene mutation-heterozygous   . DVT (deep venous thrombosis) ([place]) 2014   BLE  . Dysrhythmia    PAT-sees dr Lina Sayre meds  . GERD (gastroesophageal reflux disease)   . H/O hiatal hernia   . History of blood transfusion 01-25-13   2 units 01-11-13 ("after chem")  . History of chemotherapy    Last dose to be 02-04-13.  Was rx'd with Herceptin & Gemzar  . Lymphedema    RUE  . Osteopenia    "lower spine only" (05/10/2014)  . PAT (paroxysmal atrial tachycardia) ([facility])    hx  . Pneumonia 01-25-13   hx. 12-27-12-hospital stay x 9 days, now resolved.  . Pneumonia    "episodic; all my life" (05/10/2014)  . PONV (postoperative nausea and vomiting) 09-13-12   severe, with Port-a-cath, was managed without PONV  . S/P radiation therapy 4-12 wks ago 03/31/13-05/17/13   right breast/supraclavicular fossa/posterior axillary boost    Patient Active Problem List   Diagnosis Date Noted  . Moderate persistent asthma, uncomplicated 41/32/4401  . Seasonal and perennial allergic rhinitis 09/17/2017  . S/P mastectomy  05/10/2014  . Cancer of central portion of right female breast ([place]) 09/23/2013  . Pulmonary infiltrates 01/03/2013  . Acute respiratory failure with hypoxia ([place]) 12/29/2012  . Hypokalemia 12/29/2012  . Pulmonary embolism ([place]) 12/28/2012  . Healthcare-associated pneumonia 12/27/2012  . Acute respiratory distress 12/27/2012  . Asthma   . GERD (gastroesophageal reflux disease)   . Dysrhythmia   . Allergy   . Arthritis   . Clotting disorder ([place])   . History of chemotherapy   . DVT (deep venous thrombosis) ([place])   . DVT of lower extremity, bilateral ([place]) 12/22/2012  . PSVT (paroxysmal supraventricular tachycardia) ([place]) 10/14/2012  . PONV (postoperative nausea and vomiting) 09/13/2012  . PAT (paroxysmal atrial tachycardia) ([place])     Past Surgical History:  Procedure Laterality Date  . BREAST BIOPSY Right 2013 X 3  . CHALAZION EXCISION Right 1980's   right( MD office)  . COLONOSCOPY    . DILATION AND CURETTAGE OF UTERUS  1998 X 3  . INSERTION OF VENA CAVA FILTER  12/2012  . MASTECTOMY Left 05/10/2014   PROPHYLACTIC   . MASTECTOMY MODIFIED RADICAL Right 02/08/2013   Procedure: MASTECTOMY MODIFIED RADICAL;  Surgeon: Rolm Bookbinder, MD;  Location: [facility];  Service: General;  Laterality: Right;  . MASTECTOMY MODIFIED RADICAL Right   . PORT-A-CATH REMOVAL  05/10/2014  . PORT-A-CATH REMOVAL N/A 05/10/2014   Procedure: REMOVAL PORT-A-CATH;  Surgeon: Rolm Bookbinder,  MD;  Location: [facility];  Service: General;  Laterality: N/A;  . PORTACATH PLACEMENT  07/21/2012   Procedure: INSERTION PORT-A-CATH;  Surgeon: Rolm Bookbinder, MD;  Location: [facility];  Service: General;  Laterality: Left;/ Replacement done 10'13  . SIMPLE MASTECTOMY WITH AXILLARY SENTINEL NODE BIOPSY Left 05/10/2014   Procedure: PROPHYLACTIC LEFT MASTECTOMY;  Surgeon: Rolm Bookbinder, MD;  Location: [facility];  Service: General;  Laterality: Left;  . TONSILLECTOMY    . TUBAL LIGATION  ~ 1974  . TUMOR  EXCISION Right 1970   giant cell, off my forearm"    Allergies Anesthetics, amide; Bactrim [sulfamethoxazole-trimethoprim]; Sulfa antibiotics; Codeine; Codeine phosphate; and Eggs or egg-derived products  Family History  Problem Relation Age of Onset  . Diabetes Mother   . Heart disease Mother   . Hypertension Mother   . Hyperlipidemia Mother   . Breast cancer Mother 44       lobular breast cancer  . Heart disease Maternal Grandmother        died in her 39s from heart disease  . Lung cancer Father 72       adenocarcinoma  . Allergic rhinitis Father   . Asthma Father   . Cancer Paternal Uncle        dx in mid 20s with a cancer in the leg, died late 50s; grandpaternal half uncle  . Allergic rhinitis Sister   . Asthma Sister   . Heart disease Maternal Aunt   . Heart disease Maternal Uncle   . Allergic rhinitis Son   . Asthma Son   . Angioedema Neg Hx   . Eczema Neg Hx   . Immunodeficiency Neg Hx   . Urticaria Neg Hx     Social History Social History   Tobacco Use  . Smoking status: Former Smoker    Packs/day: 1.00    Years: 15.00    Pack years: 15.00    Types: Cigarettes    Last attempt to quit: 07/21/1979    Years since quitting: 39.1  . Smokeless tobacco: Never Used  Substance Use Topics  . Alcohol use: No  . Drug use: No    Review of Systems  Constitutional: No fever/chills Eyes: No visual changes. ENT: No sore throat. Positive right lateral face swelling.  Cardiovascular: Denies chest pain. Respiratory: Denies shortness of breath. Gastrointestinal: No abdominal pain.  No nausea, no vomiting.  No diarrhea.  No constipation. Genitourinary: Negative for dysuria. Musculoskeletal: Negative for back pain. Skin: Negative for rash. Neurological: Negative for headaches, focal weakness or numbness.  10-point ROS otherwise negative.  ____________________________________________   PHYSICAL EXAM:  VITAL SIGNS: ED Triage Vitals  Enc Vitals Group     BP  09/15/18 1909 (!) 149/83     Pulse Rate 09/15/18 1909 90     Resp 09/15/18 1909 19     Temp 09/15/18 1909 98.7 F (37.1 C)     Temp Source 09/15/18 1909 Oral     SpO2 09/15/18 1909 98 %     Pain Score 09/15/18 1910 7   Constitutional: Alert and oriented. Well appearing and in no acute distress. Eyes: Conjunctivae are normal. Head: Atraumatic. Nose: No congestion/rhinnorhea. Mouth/Throat: Mucous membranes are moist.  Oropharynx non-erythematous. Normal tongue and posterior pharynx. No PTA. Managing oral secretions.  Neck: No stridor.  Cardiovascular: Normal rate, regular rhythm. Good peripheral circulation. Grossly normal heart sounds.   Respiratory: Normal respiratory effort.  No retractions. Lungs CTAB. Gastrointestinal: Soft and nontender. No distention.  Musculoskeletal: No  lower extremity tenderness nor edema. No gross deformities of extremities. Neurologic:  Normal speech and language. No gross focal neurologic deficits are appreciated.  Skin:  Skin is warm, dry and intact. No rash noted.  ____________________________________________   LABS (all labs ordered are listed, but only abnormal results are displayed)  Labs Reviewed  BASIC METABOLIC PANEL - Abnormal; Notable for the following components:      Result Value   Glucose, Bld 150 (*)    BUN 34 (*)    Creatinine, Ser 1.03 (*)    GFR calc non Af Amer 53 (*)    All other components within normal limits  CBC WITH DIFFERENTIAL/PLATELET   ____________________________________________  RADIOLOGY  None ____________________________________________   PROCEDURES  Procedure(s) performed:   Procedures  None ____________________________________________   INITIAL IMPRESSION / ASSESSMENT AND PLAN / ED COURSE  Pertinent labs & imaging results that were available during my care of the patient were reviewed by me and considered in my medical decision making (see chart for details).  She presents to the emergency  department with acute onset right face/jaw swelling.  No dental pain or abscess.  No overlying cellulitis.  Normal posterior pharynx and tongue.  No concern for impending airway compromise.  Symptoms seem most consistent with salivary gland stone.  Plan for screening labs including CBC to assess cell lines but less likely an oncology process with acute onset symptoms.   CBC and BMP normal. Plan for sialogogue at home and ENT f/u if symptoms worsen. Swelling improved after ED observation. Pain improved. I do not see an indication at this time for advanced imaging. No evidence to suggest acute infection.   At this time, I do not feel there is any life-threatening condition present. I have reviewed and discussed all results (EKG, imaging, lab, urine as appropriate), exam findings with patient. I have reviewed nursing notes and appropriate previous records.  I feel the patient is safe to be discharged home without further emergent workup. Discussed usual and customary return precautions. Patient and family (if present) verbalize understanding and are comfortable with this plan.  Patient will follow-up with their primary care provider. If they do not have a primary care provider, information for follow-up has been provided to them. All questions have been answered.   ____________________________________________  FINAL CLINICAL IMPRESSION(S) / ED DIAGNOSES  Final diagnoses:  Swelling of face   Note:  This document was prepared using Dragon voice recognition software and may include unintentional dictation errors.  Nanda Quinton, MD Emergency Medicine    Angle Dirusso, [facility], MD 09/15/18 [phone]

## 2018-09-22 ENCOUNTER — Ambulatory Visit (HOSPITAL_COMMUNITY)
Admission: RE | Admit: 2018-09-22 | Discharge: 2018-09-22 | Disposition: A | Discharge status: Home / Self Care | Payer: Medicare Other | Source: Ambulatory Visit | Attending: Hematology and Oncology | Admitting: Hematology and Oncology

## 2018-09-22 ENCOUNTER — Telehealth: Payer: Self-pay | Admitting: Hematology and Oncology

## 2018-09-22 ENCOUNTER — Encounter (HOSPITAL_COMMUNITY): Payer: Self-pay

## 2018-09-22 DIAGNOSIS — C3490 Malignant neoplasm of unspecified part of unspecified bronchus or lung: Secondary | ICD-10-CM | POA: Diagnosis not present

## 2018-09-22 DIAGNOSIS — I700 Atherosclerosis of aorta: Secondary | ICD-10-CM | POA: Diagnosis not present

## 2018-09-22 DIAGNOSIS — R918 Other nonspecific abnormal finding of lung field: Secondary | ICD-10-CM | POA: Diagnosis not present

## 2018-09-22 DIAGNOSIS — I2510 Atherosclerotic heart disease of native coronary artery without angina pectoris: Secondary | ICD-10-CM | POA: Diagnosis not present

## 2018-09-22 DIAGNOSIS — K449 Diaphragmatic hernia without obstruction or gangrene: Secondary | ICD-10-CM | POA: Insufficient documentation

## 2018-09-22 DIAGNOSIS — J701 Chronic and other pulmonary manifestations due to radiation: Secondary | ICD-10-CM | POA: Diagnosis not present

## 2018-09-22 DIAGNOSIS — R911 Solitary pulmonary nodule: Secondary | ICD-10-CM

## 2018-09-22 MED ORDER — SODIUM CHLORIDE 0.9 % IJ SOLN
INTRAMUSCULAR | Status: AC
  Filled 2018-09-22: qty 50

## 2018-09-22 MED ORDER — IOHEXOL 300 MG/ML  SOLN
75.0000 mL | Freq: Once | INTRAMUSCULAR | Status: AC | PRN
  Administered 2018-09-22: 75 mL via INTRAVENOUS

## 2018-09-22 NOTE — Telephone Encounter (Signed)
I left a voicemail that the CT scan showed slight progression of the lung nodule from 7 mm to 10 mm. I would like to obtain a PET CT scan.

## 2018-09-29 ENCOUNTER — Encounter: Payer: Self-pay | Admitting: Cardiology

## 2018-09-29 ENCOUNTER — Ambulatory Visit (HOSPITAL_COMMUNITY)
Admission: RE | Admit: 2018-09-29 | Discharge: 2018-09-29 | Disposition: A | Discharge status: Home / Self Care | Payer: Medicare Other | Source: Ambulatory Visit | Attending: Hematology and Oncology | Admitting: Hematology and Oncology

## 2018-09-29 DIAGNOSIS — R918 Other nonspecific abnormal finding of lung field: Secondary | ICD-10-CM | POA: Diagnosis not present

## 2018-09-29 DIAGNOSIS — R911 Solitary pulmonary nodule: Secondary | ICD-10-CM | POA: Insufficient documentation

## 2018-09-29 LAB — GLUCOSE, CAPILLARY: Glucose-Capillary: 86 mg/dL (ref 70–99)

## 2018-09-29 MED ORDER — FLUDEOXYGLUCOSE F - 18 (FDG) INJECTION
7.3500 | Freq: Once | INTRAVENOUS | Status: AC
  Administered 2018-09-29: 7.35 via INTRAVENOUS

## 2018-09-30 ENCOUNTER — Telehealth: Payer: Self-pay | Admitting: Hematology and Oncology

## 2018-09-30 NOTE — Telephone Encounter (Signed)
I informed the patient that the PET CT scan does not show any hypermetabolic activity in the lung nodule. It could be that the lung nodule is below the level of threshold for the PET CT scan or it could be a benign nodule. At this time there is no concern and I will see the patient back with her follow-up

## 2018-10-13 ENCOUNTER — Telehealth: Payer: Self-pay | Admitting: Hematology and Oncology

## 2018-10-13 NOTE — Telephone Encounter (Signed)
VG out 11/15 - moved f/u to 11/26. Spoke with patient.

## 2018-10-15 ENCOUNTER — Inpatient Hospital Stay: Payer: Medicare Other | Admitting: Hematology and Oncology

## 2018-10-18 DIAGNOSIS — E119 Type 2 diabetes mellitus without complications: Secondary | ICD-10-CM | POA: Diagnosis not present

## 2018-10-21 NOTE — Progress Notes (Signed)
Patient Care Team: Leighton Ruff, MD as PCP - General (Family Medicine) Jerline Pain, MD as PCP - Cardiology (Cardiology)  DIAGNOSIS:    ICD-10-CM   1. Malignant neoplasm of central portion of right breast in female, estrogen receptor negative ([place]) C50.111    Z17.1   2. Malignant neoplasm of unspecified part of unspecified bronchus or lung ([place]) C34.90 CT Chest W Contrast    SUMMARY OF ONCOLOGIC HISTORY:   Cancer of central portion of right female breast ([place])   07/14/2012 Mammogram    And invasive mammary carcinoma with lymphovascular invasion grade 3 ER negative PR negative HER-2 positive inflammatory breast cancer    07/19/2012 Breast MRI    Right breast neoplasm level I and right axillary lymph node compatible with lymph node metastases biopsy proven to be invasive mammary cancer    07/23/2012 - 12/17/2012 Neo-Adjuvant Chemotherapy    Neoadjuvant FEC 100 followed by Taxol Herceptin due to neuropathy Taxol was replaced by gemcitabine x5 cycles    12/07/2012 -  Hospital Admission    Bilateral lower extremity DVT, workup showed prothrombin gene mutation heterozygous and IVC filter, bilateral pulmonary emboli and acute respiratory failure requiring vent support bronchoscopy and BAL concerning for adenocarcinoma, PET/CT negative    02/08/2013 Surgery    Right mastectomy showed complete response to chemotherapy in the breast and 11 lymph nodes negative    02/25/2013 - 10/14/2013 Chemotherapy    Herceptin maintenance for one year    03/31/2013 - 05/17/2013 Radiation Therapy    Adjuvant radiation therapy     05/10/2014 Surgery    Left mastectomy    09/29/2018 PET scan    No significant PET uptake in the subcentimeter solid right upper lobe lung nodule measuring 6 mm on the current study     CHIEF COMPLIANT: Surveillance of right breast cancer  INTERVAL HISTORY: Jessica Wells is a 72 y.o. with above-mentioned history of right breast cancer treated with neoadjuvant chemotherapy  followed by right mastectomy and radiation. She is currently on surveillance. I last saw her one year ago and in the interim she visited the ER on 09/15/18 for facial swelling and pain. Her most recent chest CT on 09/22/18 and PET scan on 09/29/18 showed a pulmonary nodule on the right upper lobe that measures 88m.   She presents to the clinic today alone and is doing well. She will get a CT scan in a year for surveillance of the pulmonary nodule. She notes she has been diagnosed with an alpha-gal allergy and has changed medications to Xarelto from Eliquis which she is doing well with. She noted concerns about her facial swelling and if it is a result of radiation, and symptoms to watch out for to prevent this happening again. She reviewed her allergies and medications with me.   REVIEW OF SYSTEMS:   Constitutional: Denies fevers, chills or abnormal weight loss Eyes: Denies blurriness of vision Ears, nose, mouth, throat, and face: Denies mucositis or sore throat Respiratory: Denies cough, dyspnea or wheezes Cardiovascular: Denies palpitation, chest discomfort Gastrointestinal:  Denies nausea, heartburn or change in bowel habits Skin: Denies abnormal skin rashes Lymphatics: Denies new lymphadenopathy or easy bruising Neurological:Denies numbness, tingling or new weaknesses Behavioral/Psych: Mood is stable, no new changes  Extremities: No lower extremity edema Breast: denies any pain or lumps or nodules in either breasts All other systems were reviewed with the patient and are negative.  I have reviewed the past medical history, past surgical history, social history and family  history with the patient and they are unchanged from previous note.  ALLERGIES:  is allergic to anesthetics, amide; bactrim [sulfamethoxazole-trimethoprim]; sulfa antibiotics; codeine; codeine phosphate; and eggs or egg-derived products.  MEDICATIONS:  Current Outpatient Medications  Medication Sig Dispense Refill  .  albuterol (PROAIR HFA) 108 (90 Base) MCG/ACT inhaler 4 Puffs every 4-6 hours as needed for cough or wheezing.  Use with spacer. 1 Inhaler 1  . atorvastatin (LIPITOR) 10 MG tablet Take 10 mg by mouth daily.    . B Complex-C (SUPER B COMPLEX PO) Take 1 tablet by mouth at bedtime.     . budesonide-formoterol (SYMBICORT) 160-4.5 MCG/ACT inhaler INHALE 2 PUFFS BY MOUTH TWICE DAILY USE  WITH  SPACER 1 Inhaler 5  . calcium-vitamin D (OSCAL WITH D) 500-200 MG-UNIT per tablet Take 1 tablet by mouth every other day.     Marland Kitchen EPINEPHrine 0.3 mg/0.3 mL IJ SOAJ injection Use as directed for severe allergic reaction 2 Device 1  . fish oil-omega-3 fatty acids 1000 MG capsule Take 1 g by mouth at bedtime.     . Flaxseed, Linseed, (FLAX SEEDS PO) Take 1 tablet by mouth daily.    . fluticasone (FLONASE) 50 MCG/ACT nasal spray Place 1 spray into both nostrils as needed for allergies or rhinitis. 16 g 5  . glucosamine-chondroitin 500-400 MG tablet Take 1 tablet by mouth at bedtime.     Marland Kitchen lisinopril (PRINIVIL,ZESTRIL) 2.5 MG tablet Take 2.5 mg by mouth every evening.     . metoprolol succinate (TOPROL-XL) 50 MG 24 hr tablet TAKE 1 TABLET BY MOUTH ONCE DAILY WITH  OR  IMMEDIATELY  FOLLOWING  A  MEAL Please call and schedule an appt for further refills 1st attempt 90 tablet 0  . Multiple Vitamin (MULTIVITAMIN WITH MINERALS) TABS Take 1 tablet by mouth every other day.     Marland Kitchen omeprazole (PRILOSEC) 20 MG capsule Take 1 capsule (20 mg total) by mouth daily. For heartburn 30 capsule 5  . rivaroxaban (XARELTO) 20 MG TABS tablet TAKE 1 TABLET BY MOUTH ONCE DAILY WITH SUPPER 30 tablet 11  . vitamin C (ASCORBIC ACID) 500 MG tablet Take 500 mg by mouth 3 (three) times a week.     . vitamin E 1000 UNIT capsule Take 1,000 Units by mouth 3 (three) times a week.      No current facility-administered medications for this visit.     PHYSICAL EXAMINATION: ECOG PERFORMANCE STATUS: 0 - Asymptomatic  Vitals:   10/26/18 1126  BP: 120/72   Pulse: 78  Resp: 16  Temp: 98.4 F (36.9 C)  SpO2: 97%   Filed Weights   10/26/18 1126  Weight: 146 lb (66.2 kg)    GENERAL:alert, no distress and comfortable SKIN: skin color, texture, turgor are normal, no rashes or significant lesions EYES: normal, Conjunctiva are pink and non-injected, sclera clear OROPHARYNX:no exudate, no erythema and lips, buccal mucosa, and tongue normal  NECK: supple, thyroid normal size, non-tender, without nodularity LYMPH:  no palpable lymphadenopathy in the cervical, axillary or inguinal LUNGS: clear to auscultation and percussion with normal breathing effort HEART: regular rate & rhythm and no murmurs and no lower extremity edema ABDOMEN:abdomen soft, non-tender and normal bowel sounds MUSCULOSKELETAL:no cyanosis of digits and no clubbing  NEURO: alert & oriented x 3 with fluent speech, no focal motor/sensory deficits EXTREMITIES: No lower extremity edema  LABORATORY DATA:  I have reviewed the data as listed CMP Latest Ref Rng & Units 09/15/2018 10/15/2017 10/10/2016  Glucose 70 -  99 mg/dL 150(H) 121 124  BUN 8 - 23 mg/dL 34(H) 25.6 28.3(H)  Creatinine 0.44 - 1.00 mg/dL 1.03(H) 0.9 1.0  Sodium 135 - 145 mmol/L 140 139 139  Potassium 3.5 - 5.1 mmol/L 4.1 4.0 4.6  Chloride 98 - 111 mmol/L 103 - -  CO2 22 - 32 mmol/L _0 Calcium 8.9 - 10.3 mg/dL 9.5 9.6 9.5  Total Protein 6.4 - 8.3 g/dL - 7.1 7.0  Total Bilirubin 0.20 - 1.20 mg/dL - 0.68 0.59  Alkaline Phos 40 - 150 U/L - 51 65  AST 5 - 34 U/L - 18 18  ALT 0 - 55 U/L - 23 20    Lab Results  Component Value Date   WBC 9.4 09/15/2018   HGB 13.8 09/15/2018   HCT 42.0 09/15/2018   MCV 93.8 09/15/2018   PLT 168 09/15/2018   NEUTROABS 6.8 09/15/2018    ASSESSMENT & PLAN:  Cancer of central portion of right female breast Right breast inflammation breast cancer diagnosed August 2013 treated with neoadjuvant chemotherapy completed 12/17/2012 hospitalized for bilateral DVT and  respiratory failure, bronchoscopy showed adenocarcinoma, right mastectomy 02/08/2013 followed by Herceptin maintenance completed 10/14/2013, adjuvant radiation completed 05/17/2013.  Lung adenocarcinoma: CT scans done 11/15/2018negative for cancer, stable scattered 3-4 mm pulmonary nodules, unchanged  Breast Cancer Surveillance: 1. Chest wall exam 10/16/2017: Normal 2. Mammogram: No role of mammogram since she had bilateral mastectomies  PET CT scan for evaluation of lung nodule did not show any activity in the nodule. Emergency room visit for parotid gland enlargement: It has significantly improved  Return to clinic in 1 year with annual CT scans of the chest.    Orders Placed This Encounter  Procedures  . CT Chest W Contrast    Standing Status:   Future    Standing Expiration Date:   04/25/2020    Order Specific Question:   ** REASON FOR EXAM (FREE TEXT)    Answer:   Lung nodules and H/O lung cancer    Order Specific Question:   If indicated for the ordered procedure, I authorize the administration of contrast media per Radiology protocol    Answer:   Yes    Order Specific Question:   Preferred imaging location?    Answer:   [facility]    Order Specific Question:   Radiology Contrast Protocol - do NOT remove file path    Answer:   [email or web address]   The patient has a good understanding of the overall plan. she agrees with it. she will call with any problems that may develop before the next visit here.  Nicholas Lose, MD 10/26/2018   I, Cloyde Reams Dorshimer, am acting as scribe for Nicholas Lose, MD.  I have reviewed the above documentation for accuracy and completeness, and I agree with the above.

## 2018-10-22 ENCOUNTER — Encounter: Payer: Self-pay | Admitting: Cardiology

## 2018-10-22 ENCOUNTER — Ambulatory Visit (INDEPENDENT_AMBULATORY_CARE_PROVIDER_SITE_OTHER): Payer: Medicare Other | Admitting: Cardiology

## 2018-10-22 VITALS — BP 124/74 | HR 84 | Ht 61.0 in | Wt 148.0 lb

## 2018-10-22 DIAGNOSIS — I471 Supraventricular tachycardia: Secondary | ICD-10-CM

## 2018-10-22 DIAGNOSIS — D689 Coagulation defect, unspecified: Secondary | ICD-10-CM | POA: Diagnosis not present

## 2018-10-22 NOTE — Patient Instructions (Signed)
Medication Instructions:  Your physician recommends that you continue on your current medications as directed. Please refer to the Current Medication list given to you today.   If you need a refill on your cardiac medications before your next appointment, please call your pharmacy.   Lab work: None If you have labs (blood work) drawn today and your tests are completely normal, you will receive your results only by: Marland Kitchen MyChart Message (if you have MyChart) OR . A paper copy in the mail If you have any lab test that is abnormal or we need to change your treatment, we will call you to review the results.  Testing/Procedures: None  Follow-Up: At [facility], you and your health needs are our priority.  As part of our continuing mission to provide you with exceptional heart care, we have created designated Provider Care Teams.  These Care Teams include your primary Cardiologist (physician) and Advanced Practice Providers (APPs -  Physician Assistants and Nurse Practitioners) who all work together to provide you with the care you need, when you need it. You will need a follow up appointment in 12 months.  Please call our office 2 months in advance to schedule this appointment.  You may see Candee Furbish, MD or one of the following Advanced Practice Providers on your designated Care Team:   Truitt Merle, NP Cecilie Kicks, NP . Kathyrn Drown, NP  Any Other Special Instructions Will Be Listed Below (If Applicable).

## 2018-10-22 NOTE — Progress Notes (Signed)
Cardiology Office Note:    Date:  10/22/2018   ID:  Jessica Wells, DOB 10/11/1946, MRN [id number]  PCP:  Leighton Ruff, MD  Cardiologist:  Candee Furbish, MD  Electrophysiologist:  None   Referring MD: Leighton Ruff, MD     History of Present Illness:    Jessica Wells is a 72 y.o. female here for follow-up of PSVT/paroxysmal atrial tachycardia.  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  Previously was seen by Dr. Haroldine Laws.  Was on Herceptin.  Second mastectomy 05/2014-Dr. Donne Hazel.  Occasional right arm lymphedema.  Wraps arms.  Past Medical History:  Diagnosis Date  . Allergy   . Anemia    "after chemo"  . Arthritis    "knees; fingers; occasionally" (05/10/2014)  . Asthma    Uses Inhalers Proventil as needed  . Breast cancer ([place]) 07/14/12   inflammatory right breast ca, ER/PR -  . Bursitis    "both shoulders"  . Clotting disorder ([place])    prothrombin gene mutation-heterozygous   . DVT (deep venous thrombosis) ([place]) 2014   BLE  . Dysrhythmia    PAT-sees dr Lina Sayre meds  . GERD (gastroesophageal reflux disease)   . H/O hiatal hernia   . History of blood transfusion 01-25-13   2 units 01-11-13 ("after chem")  . History of chemotherapy    Last dose to be 02-04-13.  Was rx'd with Herceptin & Gemzar  . Lymphedema    RUE  . Osteopenia    "lower spine only" (05/10/2014)  . PAT (paroxysmal atrial tachycardia) ([facility])    hx  . Pneumonia 01-25-13   hx. 12-27-12-hospital stay x 9 days, now resolved.  . Pneumonia    "episodic; all my life" (05/10/2014)  . PONV (postoperative nausea and vomiting) 09-13-12   severe, with Port-a-cath, was managed without PONV  . S/P radiation therapy 4-12 wks ago 03/31/13-05/17/13   right breast/supraclavicular fossa/posterior axillary boost    Past Surgical History:  Procedure Laterality Date  . BREAST BIOPSY Right 2013 X 3  . CHALAZION EXCISION Right 1980's   right( MD office)  . COLONOSCOPY    .  DILATION AND CURETTAGE OF UTERUS  1998 X 3  . INSERTION OF VENA CAVA FILTER  12/2012  . MASTECTOMY Left 05/10/2014   PROPHYLACTIC   . MASTECTOMY MODIFIED RADICAL Right 02/08/2013   Procedure: MASTECTOMY MODIFIED RADICAL;  Surgeon: Rolm Bookbinder, MD;  Location: [facility];  Service: General;  Laterality: Right;  . MASTECTOMY MODIFIED RADICAL Right   . PORT-A-CATH REMOVAL  05/10/2014  . PORT-A-CATH REMOVAL N/A 05/10/2014   Procedure: REMOVAL PORT-A-CATH;  Surgeon: Rolm Bookbinder, MD;  Location: [facility];  Service: General;  Laterality: N/A;  . PORTACATH PLACEMENT  07/21/2012   Procedure: INSERTION PORT-A-CATH;  Surgeon: Rolm Bookbinder, MD;  Location: [facility];  Service: General;  Laterality: Left;/ Replacement done 10'13  . SIMPLE MASTECTOMY WITH AXILLARY SENTINEL NODE BIOPSY Left 05/10/2014   Procedure: PROPHYLACTIC LEFT MASTECTOMY;  Surgeon: Rolm Bookbinder, MD;  Location: [facility];  Service: General;  Laterality: Left;  . TONSILLECTOMY    . TUBAL LIGATION  ~ 1974  . TUMOR EXCISION Right 1970   giant cell, off my forearm"    Current Medications: Current Meds  Medication Sig  . albuterol (PROAIR HFA) 108 (90 Base) MCG/ACT inhaler 4 Puffs every 4-6 hours as needed for cough or wheezing.  Use with spacer.  Marland Kitchen atorvastatin (LIPITOR) 10 MG tablet Take 10 mg by mouth daily.  Marland Kitchen B  Complex-C (SUPER B COMPLEX PO) Take 1 tablet by mouth at bedtime.   . budesonide-formoterol (SYMBICORT) 160-4.5 MCG/ACT inhaler INHALE 2 PUFFS BY MOUTH TWICE DAILY USE  WITH  SPACER  . calcium-vitamin D (OSCAL WITH D) 500-200 MG-UNIT per tablet Take 1 tablet by mouth every other day.   Marland Kitchen EPINEPHrine 0.3 mg/0.3 mL IJ SOAJ injection Use as directed for severe allergic reaction  . fish oil-omega-3 fatty acids 1000 MG capsule Take 1 g by mouth at bedtime.   . Flaxseed, Linseed, (FLAX SEEDS PO) Take 1 tablet by mouth daily.  . fluticasone (FLONASE) 50 MCG/ACT nasal spray Place 1 spray into both nostrils as  needed for allergies or rhinitis.  Marland Kitchen glucosamine-chondroitin 500-400 MG tablet Take 1 tablet by mouth at bedtime.   Marland Kitchen lisinopril (PRINIVIL,ZESTRIL) 2.5 MG tablet Take 2.5 mg by mouth every evening.   . metoprolol succinate (TOPROL-XL) 50 MG 24 hr tablet TAKE 1 TABLET BY MOUTH ONCE DAILY WITH  OR  IMMEDIATELY  FOLLOWING  A  MEAL Please call and schedule an appt for further refills 1st attempt  . Multiple Vitamin (MULTIVITAMIN WITH MINERALS) TABS Take 1 tablet by mouth every other day.   Marland Kitchen omeprazole (PRILOSEC) 20 MG capsule Take 1 capsule (20 mg total) by mouth daily. For heartburn  . rivaroxaban (XARELTO) 20 MG TABS tablet TAKE 1 TABLET BY MOUTH ONCE DAILY WITH SUPPER  . vitamin C (ASCORBIC ACID) 500 MG tablet Take 500 mg by mouth 3 (three) times a week.   . vitamin E 1000 UNIT capsule Take 1,000 Units by mouth 3 (three) times a week.      Allergies:   Anesthetics, amide; Bactrim [sulfamethoxazole-trimethoprim]; Sulfa antibiotics; Codeine; Codeine phosphate; and Eggs or egg-derived products   Social History   Socioeconomic History  . Marital status: Divorced    Spouse name: Not on file  . Number of children: 1  . Years of education: Not on file  . Highest education level: Not on file  Occupational History    Employer: RETIRED    Comment: retired Brewing technologist  Social Needs  . Financial resource strain: Not on file  . Food insecurity:    Worry: Not on file    Inability: Not on file  . Transportation needs:    Medical: Not on file    Non-medical: Not on file  Tobacco Use  . Smoking status: Former Smoker    Packs/day: 1.00    Years: 15.00    Pack years: 15.00    Types: Cigarettes    Last attempt to quit: 07/21/1979    Years since quitting: 39.2  . Smokeless tobacco: Never Used  Substance and Sexual Activity  . Alcohol use: No  . Drug use: No  . Sexual activity: Not Currently    Comment: menarche age 37, P 1, menopause age 27, HRT x 3-5 yrs, low dosage  Lifestyle  . Physical  activity:    Days per week: Not on file    Minutes per session: Not on file  . Stress: Not on file  Relationships  . Social connections:    Talks on phone: Not on file    Gets together: Not on file    Attends religious service: Not on file    Active member of club or organization: Not on file    Attends meetings of clubs or organizations: Not on file    Relationship status: Not on file  Other Topics Concern  . Not on file  Social History Narrative  .  Not on file     Family History: The patient's family history includes Allergic rhinitis in her father, sister, and son; Asthma in her father, sister, and son; Breast cancer (age of onset: 47) in her mother; Cancer in her paternal uncle; Diabetes in her mother; Heart disease in her maternal aunt, maternal grandmother, maternal uncle, and mother; Hyperlipidemia in her mother; Hypertension in her mother; Lung cancer (age of onset: 69) in her father. There is no history of Angioedema, Eczema, Immunodeficiency, or Urticaria.  ROS:   Please see the history of present illness.    No fevers chills nausea vomiting syncope bleeding all other systems reviewed and are negative.  EKGs/Labs/Other Studies Reviewed:    The following studies were reviewed today: ECHOS:  07/22/12 EF 60-65% lateral s' 10.5  10/2012 EF 60-65% lateral s' 10.7  01/19/13 EF 55-60% lateral S' 13.0 No evidence of RV strain.  03/21/13 EF 55-60% lateral s' 9.8 (remeasured by me today) Global Strain -16  06/20/13 EF 55-60% Lateral S' 9.3 Global Strain -24  09/21/13 EF 60% Lateral s' 9.9 Global strain -19%   EKG:  EKG is  ordered today.  The ekg ordered today demonstrates sinus rhythm 84 PAC possible left atrial enlargement personally reviewed and interpreted1/20/2018-normal ECG 78 bpm no other changes personally reviewed and interpreted.  Recent Labs: 09/15/2018: BUN 34; Creatinine, Ser 1.03; Hemoglobin 13.8; Platelets 168; Potassium 4.1; Sodium 140  Recent Lipid Panel      Component Value Date/Time   CHOL 214 (H) 05/20/2013 1526   TRIG 226 (H) 05/20/2013 1526   HDL 45 05/20/2013 1526   CHOLHDL 4.8 05/20/2013 1526   VLDL 45 (H) 05/20/2013 1526   LDLCALC 124 (H) 05/20/2013 1526    Physical Exam:    VS:  BP 124/74   Pulse 84   Ht 5\' 1"  (1.549 m)   Wt 148 lb (67.1 kg)   LMP  (LMP Unknown)   BMI 27.96 kg/m     Wt Readings from Last 3 Encounters:  10/22/18 148 lb (67.1 kg)  08/18/18 147 lb (66.7 kg)  10/28/17 147 lb 6.4 oz (66.9 kg)     GEN:  Well nourished, well developed in no acute distress HEENT: Normal NECK: No JVD; No carotid bruits LYMPHATICS: No lymphadenopathy CARDIAC: RRR, no murmurs, rubs, gallops RESPIRATORY:  Clear to auscultation without rales, wheezing or rhonchi  ABDOMEN: Soft, non-tender, non-distended MUSCULOSKELETAL:  No edema; No deformity  SKIN: Warm and dry NEUROLOGIC:  Alert and oriented x 3 PSYCHIATRIC:  Normal affect   ASSESSMENT:    1. PSVT (paroxysmal supraventricular tachycardia) ([place])   2. PAT (paroxysmal atrial tachycardia) ([place])   3. Clotting disorder ([place])    PLAN:    In order of problems listed above:  PSVT/paroxysmal atrial tachycardia - On Toprol 50 mg.  Doing well.  No need for EP at this point.  Chronic anticoagulation secondary to DVT, breast cancer - Had IVC filter placed.  Has positive prothrombin gene mutation heterozygous.  No bleeding issues.  DVT was in the setting of breast cancer.  Question posed to oncology is should she remain on 20 mg Xarelto or reduced to 15 mg.  Breast cancer - History of Herceptin use.  Right-sided.  Doing well.  Recent PET scan.  History of PE/DVT/IVC filter 2013. - On chronic Xarelto.  Hyperlipidemia -Low-dose atorvastatin.  Previous labs reviewed.  Alpha gal allergy - She was waking up at night with hives, almost anaphylactic type reaction.  This was discovered by  Dr. Ernst Bowler.  No more hoof based meat.  1 year  Medication Adjustments/Labs and Tests  Ordered: Current medicines are reviewed at length with the patient today.  Concerns regarding medicines are outlined above.  Orders Placed This Encounter  Procedures  . EKG 12-Lead   No orders of the defined types were placed in this encounter.   Patient Instructions  Medication Instructions:  Your physician recommends that you continue on your current medications as directed. Please refer to the Current Medication list given to you today.   If you need a refill on your cardiac medications before your next appointment, please call your pharmacy.   Lab work: None If you have labs (blood work) drawn today and your tests are completely normal, you will receive your results only by: Marland Kitchen MyChart Message (if you have MyChart) OR . A paper copy in the mail If you have any lab test that is abnormal or we need to change your treatment, we will call you to review the results.  Testing/Procedures: None  Follow-Up: At [facility], you and your health needs are our priority.  As part of our continuing mission to provide you with exceptional heart care, we have created designated Provider Care Teams.  These Care Teams include your primary Cardiologist (physician) and Advanced Practice Providers (APPs -  Physician Assistants and Nurse Practitioners) who all work together to provide you with the care you need, when you need it. You will need a follow up appointment in 12 months.  Please call our office 2 months in advance to schedule this appointment.  You may see Candee Furbish, MD or one of the following Advanced Practice Providers on your designated Care Team:   Truitt Merle, NP Cecilie Kicks, NP . Kathyrn Drown, NP  Any Other Special Instructions Will Be Listed Below (If Applicable).       Signed, Candee Furbish, MD  10/22/2018 9:52 AM    [facility]

## 2018-10-26 ENCOUNTER — Inpatient Hospital Stay: Payer: Medicare Other | Attending: Hematology and Oncology | Admitting: Hematology and Oncology

## 2018-10-26 ENCOUNTER — Telehealth: Payer: Self-pay | Admitting: Hematology and Oncology

## 2018-10-26 DIAGNOSIS — Z79899 Other long term (current) drug therapy: Secondary | ICD-10-CM | POA: Insufficient documentation

## 2018-10-26 DIAGNOSIS — Z9013 Acquired absence of bilateral breasts and nipples: Secondary | ICD-10-CM

## 2018-10-26 DIAGNOSIS — Z923 Personal history of irradiation: Secondary | ICD-10-CM | POA: Insufficient documentation

## 2018-10-26 DIAGNOSIS — Z171 Estrogen receptor negative status [ER-]: Secondary | ICD-10-CM | POA: Diagnosis not present

## 2018-10-26 DIAGNOSIS — Z86718 Personal history of other venous thrombosis and embolism: Secondary | ICD-10-CM | POA: Insufficient documentation

## 2018-10-26 DIAGNOSIS — C3490 Malignant neoplasm of unspecified part of unspecified bronchus or lung: Secondary | ICD-10-CM

## 2018-10-26 DIAGNOSIS — Z7901 Long term (current) use of anticoagulants: Secondary | ICD-10-CM | POA: Insufficient documentation

## 2018-10-26 DIAGNOSIS — Z9221 Personal history of antineoplastic chemotherapy: Secondary | ICD-10-CM | POA: Diagnosis not present

## 2018-10-26 DIAGNOSIS — C50111 Malignant neoplasm of central portion of right female breast: Secondary | ICD-10-CM | POA: Diagnosis not present

## 2018-10-26 NOTE — Assessment & Plan Note (Signed)
Right breast inflammation breast cancer diagnosed August 2013 treated with neoadjuvant chemotherapy completed 12/17/2012 hospitalized for bilateral DVT and respiratory failure, bronchoscopy showed adenocarcinoma, right mastectomy 02/08/2013 followed by Herceptin maintenance completed 10/14/2013, adjuvant radiation completed 05/17/2013.  Lung adenocarcinoma: CT scans done 11/15/2018negative for cancer, stable scattered 3-4 mm pulmonary nodules, unchanged  Breast Cancer Surveillance: 1. Chest wall exam 10/16/2017: Normal 2. Mammogram: No role of mammogram since she had bilateral mastectomies  PET CT scan for evaluation of lung nodule did not show any activity in the nodule.  Return to clinic in 1 year with annual CT scans of the chest.

## 2018-10-26 NOTE — Telephone Encounter (Signed)
Gave avs and calendar ° °

## 2018-10-29 ENCOUNTER — Other Ambulatory Visit: Payer: Self-pay | Admitting: Cardiology

## 2018-11-01 ENCOUNTER — Telehealth: Payer: Self-pay | Admitting: *Deleted

## 2018-11-01 ENCOUNTER — Other Ambulatory Visit: Payer: Self-pay | Admitting: Cardiology

## 2018-11-01 MED ORDER — METOPROLOL SUCCINATE ER 50 MG PO TB24
ORAL_TABLET | ORAL | 3 refills | Status: DC
Start: 2018-11-01 — End: 2019-10-25

## 2018-11-01 NOTE — Telephone Encounter (Signed)
Patient called to request a flu shot however she has an egg allergy and has never had one before. Advised patient we would need the high dose flu vaccine for her due to age we would need to talk with Dr Ernst Bowler first but it would have to be sent to a pharmacy and she would need to be seen as a challenge. Dr Ernst Bowler please advise. I did call [facility] states they do have the flu vaccine in stock and can fill it for patient

## 2018-11-02 DIAGNOSIS — Z23 Encounter for immunization: Secondary | ICD-10-CM | POA: Diagnosis not present

## 2018-11-02 MED ORDER — INFLUENZA VAC SPLIT HIGH-DOSE 0.5 ML IM SUSY: PREFILLED_SYRINGE | INTRAMUSCULAR | 0 refills | Status: DC

## 2018-11-02 MED ORDER — FLUZONE QUADRIVALENT IM SUSP: INTRAMUSCULAR | 0 refills | Status: DC

## 2018-11-02 NOTE — Telephone Encounter (Signed)
Spoke to patient advised we would send in rx for high dose flu shot and she needs to pick it up and bring with her to appt. appt scheduled for 11/12/18 @ 9am with Webb Silversmith. Patient verbalized understanding.

## 2018-11-02 NOTE — Telephone Encounter (Signed)
I contact the patient and she prefers to see me. So since she typically is seen in [facility], I rescheduled her for this coming Thursday at 1:30pm. She can see me, get the vaccination, and wait downstairs in the waiting room for one hour. This will allow the clinic flow to work perfectly since I have three new patients in the afternoon to see as well. Patient aware of the change and very thankful.   Salvatore Marvel, MD Allergy and [place] [facility] [place]

## 2018-11-02 NOTE — Telephone Encounter (Signed)
Pharmacy called needed to resend rx for fluzone high dose.

## 2018-11-02 NOTE — Telephone Encounter (Signed)
Outpatient Medication Detail    Disp Refills Start End   metoprolol succinate (TOPROL-XL) 50 MG 24 hr tablet 90 tablet 3 11/01/2018    Sig: TAKE 1 TABLET BY MOUTH ONCE DAILY WITH OR IMMEDIATELY FOLLOWING A MEAL   Sent to pharmacy as: metoprolol succinate (TOPROL-XL) 50 MG 24 hr tablet   E-Prescribing Status: Receipt confirmed by pharmacy (11/01/2018 11:21 AM EST)   Pharmacy   [facility], [place]

## 2018-11-02 NOTE — Telephone Encounter (Signed)
I think it is fine for her to get the flu shot. Although she has had positive testing for egg in the past, it has never been very elevated. I think the majority of her symptoms were secondary to alpha gal syndrome. Now that she is avoiding red meat, she is doing much better.  Signed script and she can make an appointment for an injection in our clinic.   Salvatore Marvel, MD Allergy and [place] [facility] [place]

## 2018-11-02 NOTE — Addendum Note (Signed)
Addended by: Orlene Erm on: 11/02/2018 09:48 AM   Modules accepted: Orders

## 2018-11-02 NOTE — Addendum Note (Signed)
Addended by: Orlene Erm on: 11/02/2018 09:13 AM   Modules accepted: Orders

## 2018-11-04 ENCOUNTER — Ambulatory Visit (INDEPENDENT_AMBULATORY_CARE_PROVIDER_SITE_OTHER): Payer: Medicare Other | Admitting: Allergy & Immunology

## 2018-11-04 ENCOUNTER — Encounter: Payer: Self-pay | Admitting: Allergy & Immunology

## 2018-11-04 VITALS — BP 108/58 | HR 64 | Temp 98.1°F | Resp 16

## 2018-11-04 DIAGNOSIS — T7808XD Anaphylactic reaction due to eggs, subsequent encounter: Secondary | ICD-10-CM

## 2018-11-04 DIAGNOSIS — J4520 Mild intermittent asthma, uncomplicated: Secondary | ICD-10-CM

## 2018-11-04 NOTE — Progress Notes (Signed)
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 tolerated her influenza vaccination without adverse event. The most high risk vaccinations with her history of alpha gal are those than contain gelatin (which is a mammalian byproduct).  Although the older shingles vaccine does contain gelatin, the newer recombinant 1 which is more efficacious does not.  She also needs the Prevnar vaccine, which also does not contain gelatin.  However, since she is rather nervous about receiving vaccines, we will have her in another month for vaccine challenge.  We will go ahead and do the newer shingles vaccine and then do the Prevnar vaccine after this.   Plan/Recommendations:   1. 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  - Do some research on the other vaccines and if they contain gelatin, we can do the challenges here in the clinic again.  - Call me with any questions or concerns (you have my digits).   2. Return in about 4 weeks (around 12/02/2018) for [place].  Subjective:   Jessica Wells is a 72 y.o. female presenting today for follow up of vaccine challenge.   Jessica Wells has a history of the following: Patient Active Problem List   Diagnosis Date Noted  . Moderate persistent asthma, uncomplicated 56/21/3086  . Seasonal and perennial allergic rhinitis 09/17/2017  . S/P mastectomy 05/10/2014  . Cancer of central portion of right female breast ([place]) 09/23/2013  . Pulmonary infiltrates 01/03/2013  . Acute respiratory failure with hypoxia ([place]) 12/29/2012  . Hypokalemia 12/29/2012  . Pulmonary embolism ([place]) 12/28/2012  . Healthcare-associated pneumonia 12/27/2012  . Acute respiratory distress 12/27/2012  . Asthma   . GERD (gastroesophageal reflux disease)   . Dysrhythmia   .  Allergy   . Arthritis   . Clotting disorder ([place])   . History of chemotherapy   . DVT (deep venous thrombosis) ([place])   . DVT of lower extremity, bilateral ([place]) 12/22/2012  . PSVT (paroxysmal supraventricular tachycardia) ([place]) 10/14/2012  . PONV (postoperative nausea and vomiting) 09/13/2012  . PAT (paroxysmal atrial tachycardia) ([place])     History obtained from: chart review and patient.  Jessica Wells Primary Care Provider is Leighton Ruff, MD.     Jessica Wells is a 72 y.o. female presenting for a vaccine challenge.  Jessica Wells is a delightful 72 year old female presenting for a flu vaccine challenge.  She is well-known to this practice and has a history of multiple adverse food reactions.  However, recently we discovered that she was allergic to alpha gal and mammalian meats.  She has since stopped the mammalian meats with a marked improvement in her symptoms.  She does carry a diagnosis of an egg allergy, and her PCP prefer that she get the flu vaccine in our office.  Since the last visit, she has done fairly well.  She has continued to take food out of her diet one by one to see if this helps her overall history of reactions.  Stopping the mammalian meat was a big step in this, and she reports that her alone.  However, she does notice that the symptoms of facial itchiness oral swelling to occur sometimes when she is exposed to certain foods.  For instance, she was able to tolerate all cheeses at one point but now is a few cheeses without any problems.  She is thinking of removing dairy completely from her diet.  She has been more careful about wheat.  She does eat a lot of fruits and vegetables without any problems.  She is eating a lot of fruits and vegetables.  Overall she feels that she has more energy.  Regarding her egg allergy, she has tolerated baked eggs for years without any problems.  She has never had any scrambled eggs ever.  She does not eat waffles or pancakes any longer. She has never  any flu vaccine ever, but she is trying to protect herself more since she has her history of inflammatory breast cancer and subsequent immunosuppression.   She was recently found to have a new lump in her right breast. She underwent an emergent PET scan which was reassuring. She has seen both her oncologist and her breast surgeon, who are keeping her on the one year watch and cycle cycle.   Otherwise, there have been no changes to her past medical history, surgical history, family history, or social history.    Review of Systems: a 14-point review of systems is pertinent for what is mentioned in HPI.  Otherwise, all other systems were negative.  Constitutional: negative other than that listed in the HPI Eyes: negative other than that listed in the HPI Ears, nose, mouth, throat, and face: negative other than that listed in the HPI Respiratory: negative other than that listed in the HPI Cardiovascular: negative other than that listed in the HPI Gastrointestinal: negative other than that listed in the HPI Genitourinary: negative other than that listed in the HPI Integument: negative other than that listed in the HPI Hematologic: negative other than that listed in the HPI Musculoskeletal: negative other than that listed in the HPI Neurological: negative other than that listed in the HPI Allergy/Immunologic: negative other than that listed in the HPI    Objective:   Blood pressure (!) 108/58, pulse 64, temperature 98.1 F (36.7 C), temperature source Oral, resp. rate 16, SpO2 97 %. There is no height or weight on file to calculate BMI.   Physical Exam:  General: Alert, interactive, in no acute distress. Pleasant and talkative.  Eyes: No conjunctival injection bilaterally, no discharge on the right, no discharge on the left and no Horner-Trantas dots present. PERRL bilaterally. EOMI without pain. No photophobia.  Ears: Right TM pearly gray with normal light reflex, Left TM pearly gray  with normal light reflex, Right TM intact without perforation and Left TM intact without perforation.  Nose/Throat: External nose within normal limits and septum midline. Turbinates edematous without discharge. Posterior oropharynx mildly erythematous without cobblestoning in the posterior oropharynx. Tonsils 2+ without exudates.  Tongue without thrush. Lungs: Clear to auscultation without wheezing, rhonchi or rales. No increased work of breathing. CV: Normal S1/S2. No murmurs. Capillary refill <2 seconds.  Skin: Warm and dry, without lesions or rashes. Neuro:   Grossly intact. No focal deficits appreciated. Responsive to questions.  Diagnostic studies:   Ms. Tissue was given 25% of the vaccination followed by a 30 minute observation period. She was then given the remaining 75% of the vaccination followed by a 60 minute observation. Vitals remained within normal limits. See flow sheets for full details.   Spirometry: results normal (FEV1: 1.71/90%, FVC: 2.08/82%, FEV1/FVC: 82%).    Spirometry consistent with normal pattern.   Allergy Studies: none        Salvatore Marvel, MD  Allergy and [place] [facility] [place]

## 2018-11-04 NOTE — Patient Instructions (Addendum)
1. 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  - Do some research on the other vaccines and if they contain gelatin, we can do the challenges here in the clinic again.  - Call me with any questions or concerns (you have my digits).   2. Return in about 4 weeks (around 12/02/2018) for [place].   Please inform us of any Emergency Department visits, hospitalizations, or changes in symptoms. Call us before going to the ED for breathing or allergy symptoms since we might be able to fit you in for a sick visit. Feel free to contact us anytime with any questions, problems, or concerns.  It was a pleasure to see you again today!  Websites that have reliable patient information: 1. American Academy of Asthma, Allergy, and Immunology: www.aaaai.org 2. Food Allergy Research and Education (FARE): foodallergy.org 3. Mothers of Asthmatics: http://www.asthmacommunitynetwork.org 4. American College of Allergy, Asthma, and Immunology: MonthlyElectricBill.co.uk   Make sure you are registered to vote! If you have moved or changed any of your contact information, you will need to get this updated before voting!

## 2018-11-05 ENCOUNTER — Encounter: Payer: Self-pay | Admitting: Allergy & Immunology

## 2018-11-12 ENCOUNTER — Encounter: Payer: Self-pay | Admitting: Family Medicine

## 2018-11-23 MED FILL — SHINGRIX 50 MCG SUS: 50 | 1 days supply | Qty: 1 | Fill #0

## 2018-11-23 NOTE — Progress Notes (Signed)
I called [facility] to discuss getting the Shingrix vaccine prescribed for the patient to bring in for her next vaccine challenge. I talked to [facility] PharmD and he said that the reason it could not be prescribed directly to the patient is because it requires refrigeration. However, they can deliver the vaccine directly to the [facility] clinic location. He said that they could deliver during business hours on December 27th, December 30th, or December 31st.   Salvatore Marvel, MD Allergy and [place] of [place]

## 2018-12-02 ENCOUNTER — Encounter: Payer: Self-pay | Admitting: Allergy & Immunology

## 2018-12-02 ENCOUNTER — Ambulatory Visit (INDEPENDENT_AMBULATORY_CARE_PROVIDER_SITE_OTHER): Payer: Medicare Other | Admitting: Allergy & Immunology

## 2018-12-02 VITALS — BP 120/66 | HR 68 | Temp 97.9°F | Resp 16

## 2018-12-02 DIAGNOSIS — T7808XD Anaphylactic reaction due to eggs, subsequent encounter: Secondary | ICD-10-CM

## 2018-12-02 DIAGNOSIS — Z23 Encounter for immunization: Secondary | ICD-10-CM

## 2018-12-02 NOTE — Progress Notes (Signed)
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 She did have some trouble getting it from other sources, therefore we will go ahead and give the second dose here in the office. We will need to contain Jessica Wells to get the vaccine ordered for her next visit in two months. She is comfortable with this plan. Regarding her Prevnar, there is no gelatin in the Prevanr so I think that she could safely get this at her PCP's office. However, I am happy to give it in our office as well.   Plan/Recommendations:   1. Concern for anaphylaxis to vaccinations - You tolerated your Shingrix vaccine today without a problem. - We can go ahead and schedule your second one in two months (can be given within 2-6 months after the first). - We can do the Prevnar in one month.  - Call me with any problems.   2. Return in about 4 weeks (around 12/30/2018) for [place].  Subjective:   Jessica Wells is a 73 y.o. female presenting today for follow up of  Chief Complaint  Patient presents with  . Allergy Testing    Jessica Wells has a history of the following: Patient Active Problem List   Diagnosis Date Noted  . Moderate persistent asthma, uncomplicated 95/18/8416  . Seasonal and perennial allergic rhinitis 09/17/2017  . S/P mastectomy 05/10/2014  . Cancer of central portion of right female breast ([place]) 09/23/2013  . Pulmonary infiltrates 01/03/2013  . Acute respiratory failure with hypoxia ([place]) 12/29/2012  . Hypokalemia 12/29/2012  . Pulmonary embolism ([place]) 12/28/2012  . Healthcare-associated pneumonia 12/27/2012  . Acute respiratory distress 12/27/2012  . Asthma   . GERD (gastroesophageal reflux disease)   . Dysrhythmia   . Allergy   . Arthritis   . Clotting disorder ([place])   . History of chemotherapy    . DVT (deep venous thrombosis) ([place])   . DVT of lower extremity, bilateral ([place]) 12/22/2012  . PSVT (paroxysmal supraventricular tachycardia) ([place]) 10/14/2012  . PONV (postoperative nausea and vomiting) 09/13/2012  . PAT (paroxysmal atrial tachycardia) ([place])     History obtained from: chart review and patient.  Jessica Wells Primary Care Provider is Leighton Ruff, MD.     Jessica Wells is a 73 y.o. female presenting for a vaccine challenge.  She presents for a challenge to the Shingrix vaccine.   Since the last visit, she has done well.  She did tolerate her flu vaccine December without a problem.  She did have some soreness the following day as well as some fatigue the night of, but otherwise did fine.  She has been more socially active over the cold and flu season since she did have the flu vaccine.  She was recently found to have a new lump in her right breast. She underwent an emergent PET scan which was reassuring. She has seen both her oncologist and her breast surgeon, who are keeping her on the one year watch and cycle.   Otherwise, there have been no changes to her past medical history, surgical history, family history, or social history.    Review of Systems: a 14-point review of systems is pertinent for what is mentioned in HPI.  Otherwise, all other systems were negative.  Constitutional: negative other than that listed in the HPI Eyes: negative other than  that listed in the HPI Ears, nose, mouth, throat, and face: negative other than that listed in the HPI Respiratory: negative other than that listed in the HPI Cardiovascular: negative other than that listed in the HPI Gastrointestinal: negative other than that listed in the HPI Genitourinary: negative other than that listed in the HPI Integument: negative other than that listed in the HPI Hematologic: negative other than that listed in the HPI Musculoskeletal: negative other than that listed in the HPI Neurological:  negative other than that listed in the HPI Allergy/Immunologic: negative other than that listed in the HPI    Objective:   Blood pressure 130/78, pulse 70, temperature 97.9 F (36.6 C), temperature source Oral, resp. rate 20, SpO2 97 %. There is no height or weight on file to calculate BMI.   Physical Exam:  General: Alert, interactive, in no acute distress. Pleasant and talkative. Lovely female.  Eyes: No conjunctival injection bilaterally, no discharge on the right, no discharge on the left and no Horner-Trantas dots present. PERRL bilaterally. EOMI without pain. No photophobia.  Ears: Right TM pearly gray with normal light reflex, Left TM pearly gray with normal light reflex, Right TM intact without perforation and Left TM intact without perforation.  Nose/Throat: External nose within normal limits and septum midline. Turbinates edematous. Tonsils 2+ without exudates.  Tongue without thrush. Lungs:  Clear to auscultation without wheezing, rhonchi or rales. No increased work of breathing. CV:       Normal S1/S2. No murmurs. Capillary refill <2 seconds.  Skin:     Warm and dry, without lesions or rashes. Neuro:   Grossly intact. No focal deficits appreciated. Responsive to questions.    Diagnostic studies:   Jessica Wells was given 25% of the vaccination followed by a 30 minute observation period. She was then given the remaining 75% of the vaccination followed by a 60 minute observation. Vitals remained within normal limits. See flow sheets for full details.       Salvatore Marvel, MD  Allergy and [place] of [place]

## 2018-12-02 NOTE — Patient Instructions (Addendum)
1. Concern for anaphylaxis to vaccinations - You tolerated your Shingrix vaccine today without a problem. - We can go ahead and schedule your second one in two months (can be given within 2-6 months after the first). - Talk to your PCP about giving the Prevnar (it does not contain gelatin, so I am hoping you will do fine with that). - However, I am happy to give it if your PCP is nervous.  - Call me with any problems.   2. Return in about 8 weeks (around 01/27/2019) for [place] #2.   Please inform us of any Emergency Department visits, hospitalizations, or changes in symptoms. Call us before going to the ED for breathing or allergy symptoms since we might be able to fit you in for a sick visit. Feel free to contact us anytime with any questions, problems, or concerns.  It was a pleasure to see you again today!  Websites that have reliable patient information: 1. American Academy of Asthma, Allergy, and Immunology: www.aaaai.org 2. Food Allergy Research and Education (FARE): foodallergy.org 3. Mothers of Asthmatics: http://www.asthmacommunitynetwork.org 4. American College of Allergy, Asthma, and Immunology: MonthlyElectricBill.co.uk   Make sure you are registered to vote! If you have moved or changed any of your contact information, you will need to get this updated before voting!

## 2018-12-17 ENCOUNTER — Other Ambulatory Visit: Payer: Self-pay | Admitting: Hematology and Oncology

## 2018-12-17 DIAGNOSIS — C50111 Malignant neoplasm of central portion of right female breast: Secondary | ICD-10-CM

## 2019-01-17 NOTE — Progress Notes (Signed)
Can someone call Jessica Wells to get the second Shingrix vaccine delivered to our [facility] office? She is going to get it in our office in one month. Ask for Aaron Edelman PharmD since he knows the story.   Thanks, Salvatore Marvel, MD Allergy and [place] of [place]

## 2019-02-09 ENCOUNTER — Telehealth: Payer: Self-pay | Admitting: *Deleted

## 2019-02-09 MED FILL — SHINGRIX 50 MCG SUS: 50 | 1 days supply | Qty: 1 | Fill #1

## 2019-02-09 NOTE — Telephone Encounter (Signed)
Per Dr. Ernst Bowler called [facility] outpatient pharmacy and ordered second shringrix vaccine for her to pick up. LVM to inform pt per DPR.

## 2019-02-10 ENCOUNTER — Encounter: Payer: Self-pay | Admitting: Allergy & Immunology

## 2019-02-10 ENCOUNTER — Other Ambulatory Visit: Payer: Self-pay

## 2019-02-10 ENCOUNTER — Ambulatory Visit (INDEPENDENT_AMBULATORY_CARE_PROVIDER_SITE_OTHER): Payer: Medicare Other | Admitting: Allergy & Immunology

## 2019-02-10 VITALS — BP 110/70 | HR 63 | Temp 97.9°F | Resp 12 | Ht 61.0 in | Wt 151.7 lb

## 2019-02-10 DIAGNOSIS — T78089D Anaphylactic reaction due to eggs, unspecified, subsequent encounter: Secondary | ICD-10-CM

## 2019-02-10 DIAGNOSIS — J3089 Other allergic rhinitis: Secondary | ICD-10-CM

## 2019-02-10 DIAGNOSIS — T7819XD Other adverse food reactions, not elsewhere classified, subsequent encounter: Secondary | ICD-10-CM

## 2019-02-10 DIAGNOSIS — T781XXD Other adverse food reactions, not elsewhere classified, subsequent encounter: Secondary | ICD-10-CM

## 2019-02-10 DIAGNOSIS — J302 Other seasonal allergic rhinitis: Secondary | ICD-10-CM

## 2019-02-10 DIAGNOSIS — J4540 Moderate persistent asthma, uncomplicated: Secondary | ICD-10-CM

## 2019-02-10 DIAGNOSIS — T7808XD Anaphylactic reaction due to eggs, subsequent encounter: Secondary | ICD-10-CM

## 2019-02-10 DIAGNOSIS — Z8619 Personal history of other infectious and parasitic diseases: Secondary | ICD-10-CM

## 2019-02-10 NOTE — Patient Instructions (Addendum)
1. Concern for anaphylaxis to vaccinations - You tolerated your second dose of Shingrix vaccine today without a problem. - Talk to your PCP about giving the Prevnar (it does not contain gelatin, so I am hoping you will do fine with that). - However, I am happy to give it if your PCP is nervous.  - Call me with any problems.   2. Return in about 6 months (around 08/13/2019).   Please inform us of any Emergency Department visits, hospitalizations, or changes in symptoms. Call us before going to the ED for breathing or allergy symptoms since we might be able to fit you in for a sick visit. Feel free to contact us anytime with any questions, problems, or concerns.  It was a pleasure to see you again today!  Websites that have reliable patient information: 1. American Academy of Asthma, Allergy, and Immunology: www.aaaai.org 2. Food Allergy Research and Education (FARE): foodallergy.org 3. Mothers of Asthmatics: http://www.asthmacommunitynetwork.org 4. American College of Allergy, Asthma, and Immunology: MonthlyElectricBill.co.uk   Make sure you are registered to vote! If you have moved or changed any of your contact information, you will need to get this updated before voting!

## 2019-02-10 NOTE — Progress Notes (Signed)
FOLLOW UP  Date of Service/Encounter:  02/10/19   Assessment:   Concern for anaphylaxis to vaccinations - tolerated second dose of Shingrix today  Moderate persistent asthma, uncomplicated  Seasonal and perennial allergic rhinitis  Adverse food reactions - with some low positive IgE results   Alpha-gal sensitivity - with an IgE 16.40   History of breast cancer (nearly 6 years clean) - with recent clean scan (November 2018)   Plan/Recommendations:   1. Concern for anaphylaxis to vaccinations - You tolerated your second dose of Shingrix vaccine today without a problem. - Talk to your PCP about giving the Prevnar (it does not contain gelatin, so I am hoping you will do fine with that). - However, I am happy to give it if your PCP is nervous.  - Call me with any problems.   2. Return in about 3 months (around 05/13/2019).   Subjective:   Jessica Wells is a 73 y.o. female presenting today for follow up of No chief complaint on file.   Jessica Wells has a history of the following: Patient Active Problem List   Diagnosis Date Noted  . Moderate persistent asthma, uncomplicated 30/16/0109  . Seasonal and perennial allergic rhinitis 09/17/2017  . S/P mastectomy 05/10/2014  . Cancer of central portion of right female breast ([place]) 09/23/2013  . Pulmonary infiltrates 01/03/2013  . Acute respiratory failure with hypoxia ([place]) 12/29/2012  . Hypokalemia 12/29/2012  . Pulmonary embolism ([place]) 12/28/2012  . Healthcare-associated pneumonia 12/27/2012  . Acute respiratory distress 12/27/2012  . Asthma   . GERD (gastroesophageal reflux disease)   . Dysrhythmia   . Allergy   . Arthritis   . Clotting disorder ([place])   . History of chemotherapy   . DVT (deep venous thrombosis) ([place])   . DVT of lower extremity, bilateral ([place]) 12/22/2012  . PSVT (paroxysmal supraventricular tachycardia) ([place]) 10/14/2012  . PONV (postoperative nausea and vomiting) 09/13/2012  . PAT (paroxysmal  atrial tachycardia) ([place])     History obtained from: chart review and patient.  Jessica Wells is a 73 y.o. female presenting for a drug challenge.  She was last seen in January 2020 for her first Shingrix vaccination.  At that time, she tolerated it without any problems whatsoever.  She comes back today for her second dose.  In the interim, she has done well.  She does report some shortness of breath at this time, which she attributes to the tree pollen.  She remains on Symbicort 1 puff twice daily, increasing to 2 puffs twice daily during flares.  She has not needed any prednisone or ER visits since last visit for her breathing.  She tolerated the last dose of Shingrix without any problems.  She did feel kind of drained for 1 to 2 days.  She has been keeping herself isolated because of the coronavirus.  Otherwise, there have been no changes to her past medical history, surgical history, family history, or social history.    Review of Systems  Constitutional: Negative.  Negative for fever, malaise/fatigue and weight loss.  HENT: Negative.  Negative for congestion, ear discharge and ear pain.   Eyes: Negative for pain, discharge and redness.  Respiratory: Negative for cough, sputum production, shortness of breath and wheezing.   Cardiovascular: Negative.  Negative for chest pain and palpitations.  Gastrointestinal: Negative for abdominal pain and heartburn.  Skin: Negative.  Negative for itching and rash.  Neurological: Negative for dizziness and headaches.  Endo/Heme/Allergies: Negative for environmental allergies. Does not bruise/bleed easily.  Objective:   Blood pressure 116/66, pulse 80, temperature 98.2 F (36.8 C), temperature source Oral, resp. rate 20, height 5\' 1"  (1.549 m), weight 151 lb 10.8 oz (68.8 kg), SpO2 96 %. Body mass index is 28.66 kg/m.   Physical Exam: deferred since this was in injection challenge only   Ms.Poteatwas given 20% of the vaccination followed  by a 30 minute observation period. She was then given the remaining 80% of the vaccination followed by a 60 minute observation. Vitals remained within normal limits. See flow sheets for full details.      Salvatore Marvel, MD  Allergy and [place] of [place]

## 2019-02-10 NOTE — Addendum Note (Signed)
Addended by: Gerre Pebbles A on: 02/10/2019 05:43 PM   Modules accepted: Orders

## 2019-02-11 NOTE — Addendum Note (Signed)
Addended by: Gerre Pebbles A on: 02/11/2019 11:46 AM   Modules accepted: Orders

## 2019-03-11 ENCOUNTER — Other Ambulatory Visit: Payer: Self-pay | Admitting: Hematology and Oncology

## 2019-03-11 DIAGNOSIS — C50111 Malignant neoplasm of central portion of right female breast: Secondary | ICD-10-CM

## 2019-04-29 DIAGNOSIS — C50911 Malignant neoplasm of unspecified site of right female breast: Secondary | ICD-10-CM | POA: Diagnosis not present

## 2019-05-02 DIAGNOSIS — E119 Type 2 diabetes mellitus without complications: Secondary | ICD-10-CM | POA: Diagnosis not present

## 2019-05-02 DIAGNOSIS — I471 Supraventricular tachycardia: Secondary | ICD-10-CM | POA: Diagnosis not present

## 2019-05-02 DIAGNOSIS — E7800 Pure hypercholesterolemia, unspecified: Secondary | ICD-10-CM | POA: Diagnosis not present

## 2019-05-02 DIAGNOSIS — Z853 Personal history of malignant neoplasm of breast: Secondary | ICD-10-CM | POA: Diagnosis not present

## 2019-05-02 DIAGNOSIS — Z9013 Acquired absence of bilateral breasts and nipples: Secondary | ICD-10-CM | POA: Diagnosis not present

## 2019-06-09 ENCOUNTER — Other Ambulatory Visit: Payer: Self-pay | Admitting: Hematology and Oncology

## 2019-06-09 DIAGNOSIS — C50111 Malignant neoplasm of central portion of right female breast: Secondary | ICD-10-CM

## 2019-07-21 DIAGNOSIS — W57XXXA Bitten or stung by nonvenomous insect and other nonvenomous arthropods, initial encounter: Secondary | ICD-10-CM | POA: Diagnosis not present

## 2019-07-21 DIAGNOSIS — S30860A Insect bite (nonvenomous) of lower back and pelvis, initial encounter: Secondary | ICD-10-CM | POA: Diagnosis not present

## 2019-09-09 ENCOUNTER — Other Ambulatory Visit: Payer: Self-pay | Admitting: Hematology and Oncology

## 2019-09-09 DIAGNOSIS — C50111 Malignant neoplasm of central portion of right female breast: Secondary | ICD-10-CM

## 2019-09-12 ENCOUNTER — Other Ambulatory Visit: Payer: Self-pay | Admitting: Allergy & Immunology

## 2019-10-24 ENCOUNTER — Other Ambulatory Visit: Payer: Self-pay | Admitting: *Deleted

## 2019-10-24 ENCOUNTER — Other Ambulatory Visit: Payer: Self-pay

## 2019-10-24 ENCOUNTER — Other Ambulatory Visit: Payer: Self-pay | Admitting: Hematology and Oncology

## 2019-10-24 ENCOUNTER — Ambulatory Visit (HOSPITAL_COMMUNITY)
Admission: RE | Admit: 2019-10-24 | Discharge: 2019-10-24 | Disposition: A | Discharge status: Home / Self Care | Payer: Medicare Other | Source: Ambulatory Visit | Attending: Hematology and Oncology | Admitting: Hematology and Oncology

## 2019-10-24 ENCOUNTER — Encounter (HOSPITAL_COMMUNITY): Payer: Self-pay

## 2019-10-24 ENCOUNTER — Other Ambulatory Visit: Payer: Self-pay | Admitting: Cardiology

## 2019-10-24 DIAGNOSIS — C3490 Malignant neoplasm of unspecified part of unspecified bronchus or lung: Secondary | ICD-10-CM

## 2019-10-24 DIAGNOSIS — Z171 Estrogen receptor negative status [ER-]: Secondary | ICD-10-CM

## 2019-10-24 DIAGNOSIS — C50111 Malignant neoplasm of central portion of right female breast: Secondary | ICD-10-CM

## 2019-10-24 DIAGNOSIS — R911 Solitary pulmonary nodule: Secondary | ICD-10-CM | POA: Diagnosis not present

## 2019-10-24 LAB — POCT I-STAT CREATININE: Creatinine, Ser: 0.9 mg/dL (ref 0.44–1.00)

## 2019-10-24 MED ORDER — SODIUM CHLORIDE (PF) 0.9 % IJ SOLN
INTRAMUSCULAR | Status: AC
  Filled 2019-10-24: qty 50

## 2019-10-24 MED ORDER — IOHEXOL 300 MG/ML  SOLN
75.0000 mL | Freq: Once | INTRAMUSCULAR | Status: AC | PRN
  Administered 2019-10-24: 75 mL via INTRAVENOUS

## 2019-10-24 NOTE — Progress Notes (Signed)
istat

## 2019-10-26 DIAGNOSIS — Z853 Personal history of malignant neoplasm of breast: Secondary | ICD-10-CM | POA: Diagnosis not present

## 2019-10-26 DIAGNOSIS — E7800 Pure hypercholesterolemia, unspecified: Secondary | ICD-10-CM | POA: Diagnosis not present

## 2019-10-26 DIAGNOSIS — J4540 Moderate persistent asthma, uncomplicated: Secondary | ICD-10-CM | POA: Diagnosis not present

## 2019-10-26 DIAGNOSIS — E119 Type 2 diabetes mellitus without complications: Secondary | ICD-10-CM | POA: Diagnosis not present

## 2019-10-26 DIAGNOSIS — M8580 Other specified disorders of bone density and structure, unspecified site: Secondary | ICD-10-CM | POA: Diagnosis not present

## 2019-10-27 NOTE — Progress Notes (Signed)
Patient Care Team: Leighton Ruff, MD as PCP - General (Family Medicine) Jerline Pain, MD as PCP - Cardiology (Cardiology)  DIAGNOSIS:    ICD-10-CM   1. Malignant neoplasm of central portion of right breast in female, estrogen receptor negative ([place])  C50.111    Z17.1     SUMMARY OF ONCOLOGIC HISTORY: Oncology History  Cancer of central portion of right female breast ([place])  07/14/2012 Mammogram   And invasive mammary carcinoma with lymphovascular invasion grade 3 ER negative PR negative HER-2 positive inflammatory breast cancer   07/19/2012 Breast MRI   Right breast neoplasm level I and right axillary lymph node compatible with lymph node metastases biopsy proven to be invasive mammary cancer   07/23/2012 - 12/17/2012 Neo-Adjuvant Chemotherapy   Neoadjuvant FEC 100 followed by Taxol Herceptin due to neuropathy Taxol was replaced by gemcitabine x5 cycles   12/07/2012 -  Hospital Admission   Bilateral lower extremity DVT, workup showed prothrombin gene mutation heterozygous and IVC filter, bilateral pulmonary emboli and acute respiratory failure requiring vent support bronchoscopy and BAL concerning for adenocarcinoma, PET/CT negative   02/08/2013 Surgery   Right mastectomy showed complete response to chemotherapy in the breast and 11 lymph nodes negative   02/25/2013 - 10/14/2013 Chemotherapy   Herceptin maintenance for one year   03/31/2013 - 05/17/2013 Radiation Therapy   Adjuvant radiation therapy    05/10/2014 Surgery   Left mastectomy   09/29/2018 PET scan   No significant PET uptake in the subcentimeter solid right upper lobe lung nodule measuring 6 mm on the current study     CHIEF COMPLIANT: Surveillance of right breast cancer  INTERVAL HISTORY: Jessica Wells is a 73 y.o. with above-mentioned history of right breast cancer treated with neoadjuvant chemotherapy, right mastectomy, and radiation. She is currently on surveillance. Chest CT on 10/24/19 showed no significant  change in the 0.8cm pulmonary nodule of the right upper lobe, although it was noted to have increased in size since imaging in 2017. She presents to the clinic today for annual follow-up.   REVIEW OF SYSTEMS:   Constitutional: Denies fevers, chills or abnormal weight loss Eyes: Denies blurriness of vision Ears, nose, mouth, throat, and face: Denies mucositis or sore throat Respiratory: Denies cough, dyspnea or wheezes Cardiovascular: Denies palpitation, chest discomfort Gastrointestinal: Denies nausea, heartburn or change in bowel habits Skin: Denies abnormal skin rashes Lymphatics: Denies new lymphadenopathy or easy bruising Neurological: Denies numbness, tingling or new weaknesses Behavioral/Psych: Mood is stable, no new changes  Extremities: No lower extremity edema Breast: denies any pain or lumps or nodules in either breasts All other systems were reviewed with the patient and are negative.  I have reviewed the past medical history, past surgical history, social history and family history with the patient and they are unchanged from previous note.  ALLERGIES:  is allergic to anesthetics, amide; bactrim [sulfamethoxazole-trimethoprim]; sulfa antibiotics; codeine; codeine phosphate; eggs or egg-derived products; fish allergy; milk-related compounds; and other.  MEDICATIONS:  Current Outpatient Medications  Medication Sig Dispense Refill  . albuterol (PROAIR HFA) 108 (90 Base) MCG/ACT inhaler 4 Puffs every 4-6 hours as needed for cough or wheezing.  Use with spacer. 1 Inhaler 1  . atorvastatin (LIPITOR) 10 MG tablet Take 10 mg by mouth daily.    . B Complex-C (SUPER B COMPLEX PO) Take 1 tablet by mouth at bedtime.     . calcium-vitamin D (OSCAL WITH D) 500-200 MG-UNIT per tablet Take 1 tablet by mouth every other day.     Marland Kitchen  EPINEPHrine 0.3 mg/0.3 mL IJ SOAJ injection Use as directed for severe allergic reaction 2 Device 1  . fish oil-omega-3 fatty acids 1000 MG capsule Take 1 g by  mouth at bedtime.     . Flaxseed, Linseed, (FLAX SEEDS PO) Take 1 tablet by mouth daily.    . fluticasone (FLONASE) 50 MCG/ACT nasal spray Place 1 spray into both nostrils as needed for allergies or rhinitis. 16 g 5  . glucosamine-chondroitin 500-400 MG tablet Take 1 tablet by mouth at bedtime.     . Influenza vac split quadrivalent PF (FLUZONE HIGH-DOSE) 0.5 ML injection Bring to office (Patient not taking: Reported on 02/10/2019) 0.5 mL 0  . lisinopril (PRINIVIL,ZESTRIL) 2.5 MG tablet Take 2.5 mg by mouth every evening.     . metoprolol succinate (TOPROL-XL) 50 MG 24 hr tablet TAKE 1 TABLET BY MOUTH ONCE DAILY WITH  OR  IMMEDIATELY  FOLLOWING  A  MEAL. Please keep upcoming appt in December for future refills. Thank you 90 tablet 0  . Multiple Vitamin (MULTIVITAMIN WITH MINERALS) TABS Take 1 tablet by mouth every other day.     Marland Kitchen omeprazole (PRILOSEC) 20 MG capsule Take 1 capsule (20 mg total) by mouth daily. For heartburn 30 capsule 5  . SYMBICORT 160-4.5 MCG/ACT inhaler INHALE 2 PUFFS BY MOUTH TWICE DAILY .  USE  WITH  SPACER. 11 g 0  . vitamin C (ASCORBIC ACID) 500 MG tablet Take 500 mg by mouth 3 (three) times a week.     . vitamin E 1000 UNIT capsule Take 1,000 Units by mouth 3 (three) times a week.     Alveda Reasons 20 MG TABS tablet TAKE 1 TABLET BY MOUTH ONCE DAILY WITH SUPPER 90 tablet 0   No current facility-administered medications for this visit.     PHYSICAL EXAMINATION: ECOG PERFORMANCE STATUS: 1 - Symptomatic but completely ambulatory  Vitals:   10/28/19 1052  BP: 121/66  Pulse: 79  Resp: 16  Temp: 97.8 F (36.6 C)  SpO2: 99%   Filed Weights   10/28/19 1052  Weight: 147 lb 3.2 oz (66.8 kg)    GENERAL: alert, no distress and comfortable SKIN: skin color, texture, turgor are normal, no rashes or significant lesions EYES: normal, Conjunctiva are pink and non-injected, sclera clear OROPHARYNX: no exudate, no erythema and lips, buccal mucosa, and tongue normal  NECK:  supple, thyroid normal size, non-tender, without nodularity LYMPH: no palpable lymphadenopathy in the cervical, axillary or inguinal LUNGS: clear to auscultation and percussion with normal breathing effort HEART: regular rate & rhythm and no murmurs and no lower extremity edema ABDOMEN: abdomen soft, non-tender and normal bowel sounds MUSCULOSKELETAL: no cyanosis of digits and no clubbing  NEURO: alert & oriented x 3 with fluent speech, no focal motor/sensory deficits EXTREMITIES: No lower extremity edema BREAST: Bilateral mastectomies. (exam performed in the presence of a chaperone)  LABORATORY DATA:  I have reviewed the data as listed CMP Latest Ref Rng & Units 10/24/2019 09/15/2018 10/15/2017  Glucose 70 - 99 mg/dL - 150(H) 121  BUN 8 - 23 mg/dL - 34(H) 25.6  Creatinine 0.44 - 1.00 mg/dL 0.90 1.03(H) 0.9  Sodium 135 - 145 mmol/L - 140 139  Potassium 3.5 - 5.1 mmol/L - 4.1 4.0  Chloride 98 - 111 mmol/L - 103 -  CO2 22 - 32 mmol/L - 29 27  Calcium 8.9 - 10.3 mg/dL - 9.5 9.6  Total Protein 6.4 - 8.3 g/dL - - 7.1  Total Bilirubin 0.20 -  1.20 mg/dL - - 0.68  Alkaline Phos 40 - 150 U/L - - 51  AST 5 - 34 U/L - - 18  ALT 0 - 55 U/L - - 23    Lab Results  Component Value Date   WBC 9.4 09/15/2018   HGB 13.8 09/15/2018   HCT 42.0 09/15/2018   MCV 93.8 09/15/2018   PLT 168 09/15/2018   NEUTROABS 6.8 09/15/2018    ASSESSMENT & PLAN:  Cancer of central portion of right female breast Right breast inflammation breast cancer diagnosed August 2013 treated with neoadjuvant chemotherapy completed 12/17/2012 hospitalized for bilateral DVT and respiratory failure, bronchoscopy showed adenocarcinoma, right mastectomy 02/08/2013 followed by Herceptin maintenance completed 10/14/2013, adjuvant radiation completed 05/17/2013.  Lung adenocarcinoma: Diagnosed in 2014 CT scans done11/23/2020negative for cancer, no significant change in the mixed solid and groundglass nodule right upper lobe 8  mm.  The solid component 5 mm.  Over the years it has slightly increased in size but it has not changed since last year.   I recommended continuation of follow-up for total of 10 years. Patient wants to follow-up with her primary care physician after next year.  She will discuss this with her PCP.  Breast Cancer Surveillance: 1. Chest wall exam11/16/2018: Normal 2. Mammogram: No role of mammogram since she had bilateral mastectomies   Return to clinic in 1 yearwith annual CT scans of the chest and after that she can be followed as needed..     No orders of the defined types were placed in this encounter.  The patient has a good understanding of the overall plan. she agrees with it. she will call with any problems that may develop before the next visit here.  Nicholas Lose, MD 10/28/2019  Julious Oka Dorshimer, am acting as scribe for Dr. Nicholas Lose.  I have reviewed the above documentation for accuracy and completeness, and I agree with the above.

## 2019-10-28 ENCOUNTER — Other Ambulatory Visit: Payer: Self-pay

## 2019-10-28 ENCOUNTER — Inpatient Hospital Stay: Payer: Medicare Other | Attending: Hematology and Oncology | Admitting: Hematology and Oncology

## 2019-10-28 DIAGNOSIS — Z923 Personal history of irradiation: Secondary | ICD-10-CM | POA: Insufficient documentation

## 2019-10-28 DIAGNOSIS — C50111 Malignant neoplasm of central portion of right female breast: Secondary | ICD-10-CM | POA: Diagnosis not present

## 2019-10-28 DIAGNOSIS — Z7901 Long term (current) use of anticoagulants: Secondary | ICD-10-CM | POA: Insufficient documentation

## 2019-10-28 DIAGNOSIS — Z853 Personal history of malignant neoplasm of breast: Secondary | ICD-10-CM | POA: Insufficient documentation

## 2019-10-28 DIAGNOSIS — Z9013 Acquired absence of bilateral breasts and nipples: Secondary | ICD-10-CM | POA: Insufficient documentation

## 2019-10-28 DIAGNOSIS — C3490 Malignant neoplasm of unspecified part of unspecified bronchus or lung: Secondary | ICD-10-CM

## 2019-10-28 DIAGNOSIS — Z171 Estrogen receptor negative status [ER-]: Secondary | ICD-10-CM | POA: Diagnosis not present

## 2019-10-28 DIAGNOSIS — Z9221 Personal history of antineoplastic chemotherapy: Secondary | ICD-10-CM | POA: Diagnosis not present

## 2019-10-28 DIAGNOSIS — Z79899 Other long term (current) drug therapy: Secondary | ICD-10-CM | POA: Diagnosis not present

## 2019-10-28 NOTE — Assessment & Plan Note (Signed)
Right breast inflammation breast cancer diagnosed August 2013 treated with neoadjuvant chemotherapy completed 12/17/2012 hospitalized for bilateral DVT and respiratory failure, bronchoscopy showed adenocarcinoma, right mastectomy 02/08/2013 followed by Herceptin maintenance completed 10/14/2013, adjuvant radiation completed 05/17/2013.  Lung adenocarcinoma: CT scans done11/23/2020negative for cancer, no significant change in the mixed solid and groundglass nodule right upper lobe 8 mm.  The solid component 5 mm.  Over the years it has slightly increased in size raising the concern for malignancy.  However it is below the level of threshold for PET CT scan.  Breast Cancer Surveillance: 1. Chest wall exam11/16/2018: Normal 2. Mammogram: No role of mammogram since she had bilateral mastectomies   Return to clinic in 1 yearwith annual CT scans of the chest.

## 2019-10-31 ENCOUNTER — Telehealth: Payer: Self-pay | Admitting: Hematology and Oncology

## 2019-10-31 NOTE — Telephone Encounter (Signed)
I talk with patient regarding schedule  

## 2019-11-03 DIAGNOSIS — E119 Type 2 diabetes mellitus without complications: Secondary | ICD-10-CM | POA: Diagnosis not present

## 2019-11-03 DIAGNOSIS — J302 Other seasonal allergic rhinitis: Secondary | ICD-10-CM | POA: Diagnosis not present

## 2019-11-03 DIAGNOSIS — Z853 Personal history of malignant neoplasm of breast: Secondary | ICD-10-CM | POA: Diagnosis not present

## 2019-11-03 DIAGNOSIS — J4540 Moderate persistent asthma, uncomplicated: Secondary | ICD-10-CM | POA: Diagnosis not present

## 2019-11-03 DIAGNOSIS — Z9013 Acquired absence of bilateral breasts and nipples: Secondary | ICD-10-CM | POA: Diagnosis not present

## 2019-11-03 DIAGNOSIS — Z91018 Allergy to other foods: Secondary | ICD-10-CM | POA: Diagnosis not present

## 2019-11-03 DIAGNOSIS — E7800 Pure hypercholesterolemia, unspecified: Secondary | ICD-10-CM | POA: Diagnosis not present

## 2019-11-04 DIAGNOSIS — E7800 Pure hypercholesterolemia, unspecified: Secondary | ICD-10-CM | POA: Diagnosis not present

## 2019-11-04 DIAGNOSIS — Z9013 Acquired absence of bilateral breasts and nipples: Secondary | ICD-10-CM | POA: Diagnosis not present

## 2019-11-04 DIAGNOSIS — Z853 Personal history of malignant neoplasm of breast: Secondary | ICD-10-CM | POA: Diagnosis not present

## 2019-11-04 DIAGNOSIS — J4540 Moderate persistent asthma, uncomplicated: Secondary | ICD-10-CM | POA: Diagnosis not present

## 2019-11-04 DIAGNOSIS — Z91018 Allergy to other foods: Secondary | ICD-10-CM | POA: Diagnosis not present

## 2019-11-04 DIAGNOSIS — E119 Type 2 diabetes mellitus without complications: Secondary | ICD-10-CM | POA: Diagnosis not present

## 2019-11-08 ENCOUNTER — Other Ambulatory Visit: Payer: Self-pay | Admitting: Allergy & Immunology

## 2019-11-08 ENCOUNTER — Encounter: Payer: Self-pay | Admitting: Allergy & Immunology

## 2019-11-08 ENCOUNTER — Telehealth: Payer: Self-pay | Admitting: Allergy & Immunology

## 2019-11-08 ENCOUNTER — Encounter: Payer: Self-pay | Admitting: Cardiology

## 2019-11-08 ENCOUNTER — Other Ambulatory Visit: Payer: Self-pay

## 2019-11-08 ENCOUNTER — Ambulatory Visit (INDEPENDENT_AMBULATORY_CARE_PROVIDER_SITE_OTHER): Payer: Medicare Other | Admitting: Cardiology

## 2019-11-08 ENCOUNTER — Telehealth (INDEPENDENT_AMBULATORY_CARE_PROVIDER_SITE_OTHER): Payer: Medicare Other | Admitting: Allergy & Immunology

## 2019-11-08 VITALS — BP 140/70 | HR 62 | Ht 61.0 in | Wt 148.8 lb

## 2019-11-08 DIAGNOSIS — J3089 Other allergic rhinitis: Secondary | ICD-10-CM | POA: Diagnosis not present

## 2019-11-08 DIAGNOSIS — T7800XD Anaphylactic reaction due to unspecified food, subsequent encounter: Secondary | ICD-10-CM | POA: Diagnosis not present

## 2019-11-08 DIAGNOSIS — J4540 Moderate persistent asthma, uncomplicated: Secondary | ICD-10-CM

## 2019-11-08 DIAGNOSIS — I471 Supraventricular tachycardia: Secondary | ICD-10-CM | POA: Diagnosis not present

## 2019-11-08 DIAGNOSIS — Z887 Allergy status to serum and vaccine status: Secondary | ICD-10-CM

## 2019-11-08 DIAGNOSIS — J302 Other seasonal allergic rhinitis: Secondary | ICD-10-CM

## 2019-11-08 MED ORDER — EPINEPHRINE 0.3 MG/0.3ML IJ SOAJ: INTRAMUSCULAR | 1 refills | Status: DC

## 2019-11-08 MED ORDER — SYMBICORT 160-4.5 MCG/ACT IN AERO: INHALATION_SPRAY | RESPIRATORY_TRACT | 5 refills | Status: DC

## 2019-11-08 MED ORDER — ALBUTEROL SULFATE HFA 108 (90 BASE) MCG/ACT IN AERS: INHALATION_SPRAY | RESPIRATORY_TRACT | 1 refills | Status: DC

## 2019-11-08 NOTE — Patient Instructions (Addendum)
1. Mild persistent asthma, uncomplicated - We will not make any medication changes today. - It seems that you are doing well with this regimen. - Daily controller medication(s): Symbicort 160/4.5 one puff once daily (increasing to two twice daily during flares) - Prior to physical activity: ProAir 2 puffs 10-15 minutes before physical activity. - Rescue medications: ProAir 4 puffs every 4-6 hours as needed - Asthma control goals:  * Full participation in all desired activities (may need albuterol before activity) * Albuterol use two time or less a week on average (not counting use with activity) * Cough interfering with sleep two time or less a month * Oral steroids no more than once a year * No hospitalizations  2. Chronic allergic rhinitis (grasses, weeds, molds, dust mite, cat, dog, feathers, horse, cockroach) - Continue with Flonase 1 spray per nostril daily. - Continue with antihistamines daily as needed.  3. Intense pruritis - with alpha gal sensitivity - Continue to avoid red meat as you are doing.  - Continue with avoidance of your other triggering foods.  - EpiPen refilled.   4. Return in about 6 months (around 05/08/2020). Come back in one month for the flu shot!    Please inform us of any Emergency Department visits, hospitalizations, or changes in symptoms. Call us before going to the ED for breathing or allergy symptoms since we might be able to fit you in for a sick visit. Feel free to contact us anytime with any questions, problems, or concerns.  It was a pleasure to see you again today! Email me with the dates of your other presentations ([email or web address])! I would love to go listen to you!   Websites that have reliable patient information: 1. American Academy of Asthma, Allergy, and Immunology: www.aaaai.org 2. Food Allergy Research and Education (FARE): foodallergy.org 3. Mothers of Asthmatics: http://www.asthmacommunitynetwork.org 4. American College of  Allergy, Asthma, and Immunology: www.acaai.org

## 2019-11-08 NOTE — Patient Instructions (Signed)
Medication Instructions:  The current medical regimen is effective;  continue present plan and medications.  *If you need a refill on your cardiac medications before your next appointment, please call your pharmacy*  Follow-Up: At [facility], you and your health needs are our priority.  As part of our continuing mission to provide you with exceptional heart care, we have created designated Provider Care Teams.  These Care Teams include your primary Cardiologist (physician) and Advanced Practice Providers (APPs -  Physician Assistants and Nurse Practitioners) who all work together to provide you with the care you need, when you need it.  Your next appointment:   12 month(s)  The format for your next appointment:   In Person  Provider:   Mark Skains, MD   Thank you for choosing [facility]!!     

## 2019-11-08 NOTE — Progress Notes (Signed)
Cardiology Office Note:    Date:  11/08/2019   ID:  Jessica Wells, DOB 1946/11/06, MRN [id number]  PCP:  Leighton Ruff, MD  Cardiologist:  Candee Furbish, MD  Electrophysiologist:  None   Referring MD: Leighton Ruff, MD     History of Present Illness:    Jessica Wells is a 73 y.o. female here for follow-up of PSVT/paroxysmal atrial tachycardia.  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  Previously was seen by Dr. Haroldine Laws.  Was on Herceptin.  Second mastectomy 05/2014-Dr. Donne Hazel.  Occasional right arm lymphedema.  Wraps arms.  11/08/2019-here for follow-up of PSVT.Occasional fullness but dos not take off.  Enjoying hiking.  Denies any fevers chills nausea vomiting syncope bleeding.  Tolerating medications well.  Past Medical History:  Diagnosis Date   Allergy    Anemia    "after chemo"   Arthritis    "knees; fingers; occasionally" (05/10/2014)   Asthma    Uses Inhalers Proventil as needed   Breast cancer ([place]) 07/14/12   inflammatory right breast ca, ER/PR -   Bursitis    "both shoulders"   Clotting disorder ([place])    prothrombin gene mutation-heterozygous    DVT (deep venous thrombosis) ([place]) 2014   BLE   Dysrhythmia    PAT-sees dr Lina Sayre meds   GERD (gastroesophageal reflux disease)    H/O hiatal hernia    History of blood transfusion 01-25-13   2 units 01-11-13 ("after chem")   History of chemotherapy    Last dose to be 02-04-13.  Was rx'd with Herceptin & Gemzar   Lymphedema    RUE   Osteopenia    "lower spine only" (05/10/2014)   PAT (paroxysmal atrial tachycardia) (H[facility])    hx   Pneumonia 01-25-13   hx. 12-27-12-hospital stay x 9 days, now resolved.   Pneumonia    "episodic; all my life" (05/10/2014)   PONV (postoperative nausea and vomiting) 09-13-12   severe, with Port-a-cath, was managed without PONV   S/P radiation therapy 4-12 wks ago 03/31/13-05/17/13   right breast/supraclavicular  fossa/posterior axillary boost    Past Surgical History:  Procedure Laterality Date   BREAST BIOPSY Right 2013 X 3   CHALAZION EXCISION Right 1980's   right( [facility] office)   [place] X 3   INSERTION OF VENA CAVA FILTER  12/2012   MASTECTOMY Left 05/10/2014   PROPHYLACTIC    MASTECTOMY MODIFIED RADICAL Right 02/08/2013   Procedure: MASTECTOMY MODIFIED RADICAL;  Surgeon: Rolm Bookbinder, MD;  Location: [facility];  Service: General;  Laterality: Right;   MASTECTOMY MODIFIED RADICAL Right    PORT-A-CATH REMOVAL  05/10/2014   PORT-A-CATH REMOVAL N/A 05/10/2014   Procedure: REMOVAL PORT-A-CATH;  Surgeon: Rolm Bookbinder, MD;  Location: [place];  Service: General;  Laterality: N/A;   PORTACATH PLACEMENT  07/21/2012   Procedure: INSERTION PORT-A-CATH;  Surgeon: Rolm Bookbinder, MD;  Location: [place];  Service: General;  Laterality: Left;/ Replacement done 10'13   SIMPLE MASTECTOMY WITH AXILLARY SENTINEL NODE BIOPSY Left 05/10/2014   Procedure: PROPHYLACTIC LEFT MASTECTOMY;  Surgeon: Rolm Bookbinder, MD;  Location: [place] [place];  Service: General;  Laterality: Left;   TONSILLECTOMY     TUBAL LIGATION  ~ 1974   TUMOR EXCISION Right 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  Use with spacer.   atorvastatin (LIPITOR) 10 MG tablet Take 10 mg by mouth daily.   B Complex-C (SUPER B COMPLEX PO) Take 1 tablet by mouth at bedtime.    calcium-vitamin D (OSCAL WITH D) 500-200 MG-UNIT per tablet Take 1 tablet by mouth every other day.    EPINEPHrine 0.3 mg/0.3 mL IJ SOAJ injection Use as directed for severe allergic reaction   fish oil-omega-3 fatty acids 1000 MG capsule Take 1 g by mouth at bedtime.    Flaxseed, Linseed, (FLAX SEEDS PO) Take 1 tablet by mouth daily.    fluticasone (FLONASE) 50 MCG/ACT nasal spray Place 1 spray into both nostrils as needed for allergies or rhinitis.   glucosamine-chondroitin 500-400 MG tablet Take 1 tablet by mouth at bedtime.    Influenza vac split quadrivalent PF (FLUZONE HIGH-DOSE) 0.5 ML injection Bring to office   lisinopril (PRINIVIL,ZESTRIL) 2.5 MG tablet Take 2.5 mg by mouth every evening.    metoprolol succinate (TOPROL-XL) 50 MG 24 hr tablet TAKE 1 TABLET BY MOUTH ONCE DAILY WITH  OR  IMMEDIATELY  FOLLOWING  A  MEAL. Please keep upcoming appt in December for future refills. Thank you   Multiple Vitamin (MULTIVITAMIN WITH MINERALS) TABS Take 1 tablet by mouth every other day.    omeprazole (PRILOSEC) 20 MG capsule Take 1 capsule (20 mg total) by mouth daily. For heartburn   SYMBICORT 160-4.5 MCG/ACT inhaler INHALE 2 PUFFS BY MOUTH TWICE DAILY .  USE  WITH  SPACER.   vitamin C (ASCORBIC ACID) 500 MG tablet Take 500 mg by mouth 3 (three) times a week.    vitamin E 1000 UNIT capsule Take 1,000 Units by mouth 3 (three) times a week.    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: Not on file   Food insecurity    Worry: Not on file    Inability: Not on file   Transportation needs    Medical: Not on file    Non-medical: Not on file  Tobacco Use   Smoking status: Former Smoker    Packs/day: 1.00    Years: 15.00    Pack years: 15.00    Types: Cigarettes    Quit  date: 07/21/1979    Years since quitting: 40.3   Smokeless tobacco: Never Used  Substance and Sexual Activity   Alcohol use: No   Drug use: No   Sexual activity: Not Currently    Comment: menarche age 45, 78 1, menopause age 66, HRT x 3-5 yrs, low dosage  Lifestyle   Physical activity    Days per week: Not on file    Minutes per session: Not on file   Stress: Not on file  Relationships   Social connections    Talks on phone: Not on file    Gets together: Not on file    Attends religious service: Not on  file    Active member of club or organization: Not on file    Attends meetings of clubs or organizations: Not on file    Relationship status: Not on file  Other Topics Concern   Not on file  Social History Narrative   Not on file     Family History: The patient's family history includes Allergic rhinitis in her father, sister, and son; Asthma in her father, sister, and son; Breast cancer (age of onset: 83) in her mother; Cancer in her paternal uncle; Diabetes in her mother; Heart disease in her maternal aunt, maternal grandmother, maternal uncle, and mother; Hyperlipidemia in her mother; Hypertension in her mother; Lung cancer (age of onset: 30) in her father. There is no history of Angioedema, Eczema, Immunodeficiency, or Urticaria.  ROS:   Please see the history of present illness.    No fevers chills nausea vomiting syncope bleeding all other systems reviewed and are negative.  EKGs/Labs/Other Studies Reviewed:    The following studies were reviewed today: ECHOS:  07/22/12 EF 60-65% lateral s' 10.5  10/2012 EF 60-65% lateral s' 10.7  01/19/13 EF 55-60% lateral S' 13.0 No evidence of RV strain.  03/21/13 EF 55-60% lateral s' 9.8 (remeasured by me today) Global Strain -16  06/20/13 EF 55-60% Lateral S' 9.3 Global Strain -24  09/21/13 EF 60% Lateral s' 9.9 Global strain -19%   EKG:  EKG is  ordered today.  The ekg ordered today demonstrates sinus rhythm 84 PAC possible  left atrial enlargement personally reviewed and interpreted1/20/2018-normal ECG 78 bpm no other changes personally reviewed and interpreted.  Recent Labs: 10/24/2019: Creatinine, Ser 0.90  Recent Lipid Panel    Component Value Date/Time   CHOL 214 (H) 05/20/2013 1526   TRIG 226 (H) 05/20/2013 1526   HDL 45 05/20/2013 1526   CHOLHDL 4.8 05/20/2013 1526   VLDL 45 (H) 05/20/2013 1526   LDLCALC 124 (H) 05/20/2013 1526    Physical Exam:    VS:  BP 140/70    Pulse 62    Ht 5\' 1"  (1.549 m)    Wt 148 lb 12.8 oz (67.5 kg)    LMP  (LMP Unknown)    BMI 28.12 kg/m     Wt Readings from Last 3 Encounters:  11/08/19 148 lb 12.8 oz (67.5 kg)  10/28/19 147 lb 3.2 oz (66.8 kg)  02/10/19 151 lb 10.8 oz (68.8 kg)     GEN: Well nourished, well developed, in no acute distress  HEENT: normal  Neck: no JVD, carotid bruits, or masses Cardiac: RRR; no murmurs, rubs, or gallops,no edema  Respiratory:  clear to auscultation bilaterally, normal work of breathing GI: soft, nontender, nondistended, + BS MS: no deformity or atrophy  Skin: warm and dry, no rash Neuro:  Alert and Oriented x 3, Strength and sensation are intact Psych: euthymic mood, full affect   ASSESSMENT:    1. PAT (paroxysmal atrial tachycardia) (HCC)    PLAN:    In order of problems listed above:  PSVT/paroxysmal atrial tachycardia - On Toprol 50 mg.  Doing well.  No need for EP at this point.  Told her that she could have an extra Toprol if she began to have rapid palpitations.  Enjoying hiking.  Chronic anticoagulation secondary to DVT, breast cancer - Had IVC filter placed.  Has positive prothrombin gene mutation heterozygous.  No bleeding issues.  DVT was in the setting of breast cancer.  Question previously posed to oncology is should she remain on  20 mg Xarelto or reduced to 15 mg.  Overall she is stable without any bleeding issues.  Breast cancer - History of Herceptin use.  Right-sided.  Doing well.  Recent scan.   Stable doing well.  History of PE/DVT/IVC filter 2013. - On chronic Xarelto.  No changes.  Hyperlipidemia -Low-dose atorvastatin.  Previous labs reviewed.  LDL 77  Alpha gal allergy - She was waking up at night with hives, almost anaphylactic type reaction.  This was discovered by Dr. Ernst Bowler.  No more hoof based meat.  1 year  Medication Adjustments/Labs and Tests Ordered: Current medicines are reviewed at length with the patient today.  Concerns regarding medicines are outlined above.  Orders Placed This Encounter  Procedures   EKG 12/Charge capture   No orders of the defined types were placed in this encounter.   Patient Instructions  Medication Instructions:  The current medical regimen is effective;  continue present plan and medications.  *If you need a refill on your cardiac medications before your next appointment, please call your pharmacy*  Follow-Up: At [facility], you and your health needs are our priority.  As part of our continuing mission to provide you with exceptional heart care, we have created designated Provider Care Teams.  These Care Teams include your primary Cardiologist (physician) and Advanced Practice Providers (APPs -  Physician Assistants and Nurse Practitioners) who all work together to provide you with the care you need, when you need it.  Your next appointment:   12 month(s)  The format for your next appointment:   In Person  Provider:   Candee Furbish, MD  Thank you for choosing [facility]!!        Signed, Candee Furbish, MD  11/08/2019 10:07 AM    [place]
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
## 2019-11-09 ENCOUNTER — Encounter: Payer: Self-pay | Admitting: Allergy & Immunology

## 2019-11-10 NOTE — Progress Notes (Signed)
I called and left a voicemail for the patient to schedule.

## 2019-11-28 NOTE — Telephone Encounter (Signed)
error 

## 2019-12-05 DIAGNOSIS — N898 Other specified noninflammatory disorders of vagina: Secondary | ICD-10-CM | POA: Diagnosis not present

## 2019-12-09 DIAGNOSIS — N898 Other specified noninflammatory disorders of vagina: Secondary | ICD-10-CM | POA: Diagnosis not present

## 2019-12-09 DIAGNOSIS — L989 Disorder of the skin and subcutaneous tissue, unspecified: Secondary | ICD-10-CM | POA: Diagnosis not present

## 2019-12-09 DIAGNOSIS — Z8619 Personal history of other infectious and parasitic diseases: Secondary | ICD-10-CM | POA: Diagnosis not present

## 2019-12-10 ENCOUNTER — Other Ambulatory Visit: Payer: Self-pay | Admitting: Hematology and Oncology

## 2019-12-10 DIAGNOSIS — C50111 Malignant neoplasm of central portion of right female breast: Secondary | ICD-10-CM

## 2020-01-05 DIAGNOSIS — Z853 Personal history of malignant neoplasm of breast: Secondary | ICD-10-CM | POA: Diagnosis not present

## 2020-01-05 DIAGNOSIS — E7800 Pure hypercholesterolemia, unspecified: Secondary | ICD-10-CM | POA: Diagnosis not present

## 2020-01-05 DIAGNOSIS — E119 Type 2 diabetes mellitus without complications: Secondary | ICD-10-CM | POA: Diagnosis not present

## 2020-01-05 DIAGNOSIS — M8580 Other specified disorders of bone density and structure, unspecified site: Secondary | ICD-10-CM | POA: Diagnosis not present

## 2020-01-05 DIAGNOSIS — J4540 Moderate persistent asthma, uncomplicated: Secondary | ICD-10-CM | POA: Diagnosis not present

## 2020-01-23 ENCOUNTER — Other Ambulatory Visit: Payer: Self-pay

## 2020-01-23 MED ORDER — METOPROLOL SUCCINATE ER 50 MG PO TB24: 50.0000 mg | ORAL_TABLET | Freq: Every day | ORAL | 3 refills | Status: DC

## 2020-01-26 ENCOUNTER — Telehealth: Payer: Self-pay

## 2020-01-26 NOTE — Telephone Encounter (Signed)
Call to patient to go over algorithm for covid 19 vaccine. Asked patient if she has ever had any reactions to any vaccines, pt states no, pt has never had any issues with colonoscopy bowel prep medications, pt does have medication and food allergies, pt has not had any dermal fillers.    Per our algorithm, pt is eligible to get the Covid 19 vaccine.   Pt verbalized understanding, call ended.

## 2020-01-28 NOTE — Telephone Encounter (Signed)
Thank you for reaching out to her, Lisabeth Pick!   Salvatore Marvel, MD [facility]

## 2020-01-29 ENCOUNTER — Other Ambulatory Visit: Payer: Self-pay

## 2020-01-29 ENCOUNTER — Ambulatory Visit: Payer: Medicare Other | Attending: Internal Medicine

## 2020-01-29 DIAGNOSIS — Z23 Encounter for immunization: Secondary | ICD-10-CM

## 2020-01-29 NOTE — Progress Notes (Signed)
   Covid-19 Vaccination Clinic  Name:  Minela Bridgewater    MRN: [id number] DOB: 02-05-1946  01/29/2020  Ms. Favor was observed post Covid-19 immunization for 30 minutes based on pre-vaccination screening without incidence. She was provided with Vaccine Information Sheet and instruction to access the V-Safe system.   Ms. Goza was instructed to call 911 with any severe reactions post vaccine: Marland Kitchen Difficulty breathing  . Swelling of your face and throat  . A fast heartbeat  . A bad rash all over your body  . Dizziness and weakness    Immunizations Administered    Name Date Dose VIS Date Route   Pfizer COVID-19 Vaccine 01/29/2020  4:01 PM 0.3 mL 11/11/2019 Intramuscular   Manufacturer: [place]   Lot: [id number]   [place]: [id number]

## 2020-02-14 DIAGNOSIS — E7800 Pure hypercholesterolemia, unspecified: Secondary | ICD-10-CM | POA: Diagnosis not present

## 2020-02-14 DIAGNOSIS — J4540 Moderate persistent asthma, uncomplicated: Secondary | ICD-10-CM | POA: Diagnosis not present

## 2020-02-14 DIAGNOSIS — M8580 Other specified disorders of bone density and structure, unspecified site: Secondary | ICD-10-CM | POA: Diagnosis not present

## 2020-02-14 DIAGNOSIS — E119 Type 2 diabetes mellitus without complications: Secondary | ICD-10-CM | POA: Diagnosis not present

## 2020-02-14 DIAGNOSIS — Z853 Personal history of malignant neoplasm of breast: Secondary | ICD-10-CM | POA: Diagnosis not present

## 2020-02-28 ENCOUNTER — Ambulatory Visit: Payer: Medicare Other | Attending: Internal Medicine

## 2020-02-28 DIAGNOSIS — Z23 Encounter for immunization: Secondary | ICD-10-CM

## 2020-02-28 NOTE — Progress Notes (Signed)
   Covid-19 Vaccination Clinic  Name:  Jessica Wells    MRN: [id number] DOB: November 21, 1946  02/28/2020  [name] was observed post Covid-19 immunization for 30 minutes based on pre-vaccination screening without incident. She was provided with Vaccine Information Sheet and instruction to access the V-Safe system.   [name] was instructed to call 911 with any severe reactions post vaccine: Marland Kitchen Difficulty breathing  . Swelling of face and throat  . A fast heartbeat  . A bad rash all over body  . Dizziness and weakness   Immunizations Administered    Name Date Dose VIS Date Route   Pfizer COVID-19 Vaccine 02/28/2020  8:42 AM 0.3 mL 11/11/2019 Intramuscular   Manufacturer: [place]   Lot: [id number]   [place]: [id number]

## 2020-03-09 ENCOUNTER — Other Ambulatory Visit: Payer: Self-pay | Admitting: Hematology and Oncology

## 2020-03-09 DIAGNOSIS — C50111 Malignant neoplasm of central portion of right female breast: Secondary | ICD-10-CM

## 2020-04-23 DIAGNOSIS — H2513 Age-related nuclear cataract, bilateral: Secondary | ICD-10-CM | POA: Diagnosis not present

## 2020-04-24 DIAGNOSIS — M8580 Other specified disorders of bone density and structure, unspecified site: Secondary | ICD-10-CM | POA: Diagnosis not present

## 2020-04-24 DIAGNOSIS — Z853 Personal history of malignant neoplasm of breast: Secondary | ICD-10-CM | POA: Diagnosis not present

## 2020-04-24 DIAGNOSIS — J4540 Moderate persistent asthma, uncomplicated: Secondary | ICD-10-CM | POA: Diagnosis not present

## 2020-04-24 DIAGNOSIS — E7800 Pure hypercholesterolemia, unspecified: Secondary | ICD-10-CM | POA: Diagnosis not present

## 2020-04-24 DIAGNOSIS — E119 Type 2 diabetes mellitus without complications: Secondary | ICD-10-CM | POA: Diagnosis not present

## 2020-06-04 ENCOUNTER — Other Ambulatory Visit: Payer: Self-pay | Admitting: Hematology and Oncology

## 2020-06-04 DIAGNOSIS — C50111 Malignant neoplasm of central portion of right female breast: Secondary | ICD-10-CM

## 2020-09-01 ENCOUNTER — Other Ambulatory Visit: Payer: Self-pay | Admitting: Hematology and Oncology

## 2020-09-01 DIAGNOSIS — C50111 Malignant neoplasm of central portion of right female breast: Secondary | ICD-10-CM

## 2020-09-20 DIAGNOSIS — Z91018 Allergy to other foods: Secondary | ICD-10-CM | POA: Diagnosis not present

## 2020-09-20 DIAGNOSIS — Z7901 Long term (current) use of anticoagulants: Secondary | ICD-10-CM | POA: Diagnosis not present

## 2020-09-20 DIAGNOSIS — Z853 Personal history of malignant neoplasm of breast: Secondary | ICD-10-CM | POA: Diagnosis not present

## 2020-09-20 DIAGNOSIS — Z9013 Acquired absence of bilateral breasts and nipples: Secondary | ICD-10-CM | POA: Diagnosis not present

## 2020-09-20 DIAGNOSIS — E119 Type 2 diabetes mellitus without complications: Secondary | ICD-10-CM | POA: Diagnosis not present

## 2020-09-20 DIAGNOSIS — J4540 Moderate persistent asthma, uncomplicated: Secondary | ICD-10-CM | POA: Diagnosis not present

## 2020-09-20 DIAGNOSIS — E7800 Pure hypercholesterolemia, unspecified: Secondary | ICD-10-CM | POA: Diagnosis not present

## 2020-09-20 DIAGNOSIS — Z86711 Personal history of pulmonary embolism: Secondary | ICD-10-CM | POA: Diagnosis not present

## 2020-09-20 DIAGNOSIS — I471 Supraventricular tachycardia: Secondary | ICD-10-CM | POA: Diagnosis not present

## 2020-09-20 DIAGNOSIS — J302 Other seasonal allergic rhinitis: Secondary | ICD-10-CM | POA: Diagnosis not present

## 2020-10-08 DIAGNOSIS — H00021 Hordeolum internum right upper eyelid: Secondary | ICD-10-CM | POA: Diagnosis not present

## 2020-10-09 ENCOUNTER — Other Ambulatory Visit (HOSPITAL_BASED_OUTPATIENT_CLINIC_OR_DEPARTMENT_OTHER): Payer: Self-pay | Admitting: Internal Medicine

## 2020-10-09 ENCOUNTER — Ambulatory Visit: Payer: Medicare Other | Attending: Internal Medicine

## 2020-10-09 DIAGNOSIS — Z23 Encounter for immunization: Secondary | ICD-10-CM

## 2020-10-12 MED FILL — PFIZER-BIONTECH COVID-19 VA: 30 | 1 days supply | Qty: 0 | Fill #0

## 2020-10-18 DIAGNOSIS — H2513 Age-related nuclear cataract, bilateral: Secondary | ICD-10-CM | POA: Diagnosis not present

## 2020-10-24 ENCOUNTER — Ambulatory Visit (HOSPITAL_COMMUNITY)
Admission: RE | Admit: 2020-10-24 | Discharge: 2020-10-24 | Disposition: A | Discharge status: Home / Self Care | Payer: Medicare Other | Source: Ambulatory Visit | Attending: Hematology and Oncology | Admitting: Hematology and Oncology

## 2020-10-24 ENCOUNTER — Other Ambulatory Visit: Payer: Self-pay

## 2020-10-24 ENCOUNTER — Encounter (HOSPITAL_COMMUNITY): Payer: Self-pay

## 2020-10-24 DIAGNOSIS — K449 Diaphragmatic hernia without obstruction or gangrene: Secondary | ICD-10-CM | POA: Diagnosis not present

## 2020-10-24 DIAGNOSIS — E7800 Pure hypercholesterolemia, unspecified: Secondary | ICD-10-CM | POA: Diagnosis not present

## 2020-10-24 DIAGNOSIS — C3490 Malignant neoplasm of unspecified part of unspecified bronchus or lung: Secondary | ICD-10-CM | POA: Diagnosis not present

## 2020-10-24 DIAGNOSIS — J4540 Moderate persistent asthma, uncomplicated: Secondary | ICD-10-CM | POA: Diagnosis not present

## 2020-10-24 DIAGNOSIS — M8580 Other specified disorders of bone density and structure, unspecified site: Secondary | ICD-10-CM | POA: Diagnosis not present

## 2020-10-24 DIAGNOSIS — Z853 Personal history of malignant neoplasm of breast: Secondary | ICD-10-CM | POA: Diagnosis not present

## 2020-10-24 DIAGNOSIS — I2510 Atherosclerotic heart disease of native coronary artery without angina pectoris: Secondary | ICD-10-CM | POA: Diagnosis not present

## 2020-10-24 DIAGNOSIS — E119 Type 2 diabetes mellitus without complications: Secondary | ICD-10-CM | POA: Diagnosis not present

## 2020-10-24 LAB — POCT I-STAT CREATININE: Creatinine, Ser: 1 mg/dL (ref 0.44–1.00)

## 2020-10-24 MED ORDER — IOHEXOL 300 MG/ML  SOLN
75.0000 mL | Freq: Once | INTRAMUSCULAR | Status: AC | PRN
  Administered 2020-10-24: 75 mL via INTRAVENOUS

## 2020-10-28 NOTE — Assessment & Plan Note (Addendum)
Right breast inflammation breast cancer diagnosed August 2013 treated with neoadjuvant chemotherapy completed 12/17/2012 hospitalized for bilateral DVT and respiratory failure, bronchoscopy showed adenocarcinoma, right mastectomy 02/08/2013 followed by Herceptin maintenance completed 10/14/2013, adjuvant radiation completed 05/17/2013.  Lung adenocarcinoma: Diagnosed in 2014 CT scans done11/24/21;negative for cancer, no significant change in the mixed solid and groundglass nodule right upper lobe 8 mm.  The solid component 5 mm.  Over the years it has slightly increased in size but it has not changed since last year.  CT chest 10/21/20: Continued mild increase in size of part solid nodule within the central right upper lobe. The solid component of this nodule remains stable at 5 mm. Given the gradual overall increase in size of this lesion this is concerning for low-grade indolent neoplasm such as pulmonary adenocarcinoma. Continue annual CT is recommended until 5 years of stability has been established.  RTC in 1 year

## 2020-10-28 NOTE — Progress Notes (Signed)
Patient Care Team: Leighton Ruff, MD as PCP - General (Family Medicine) Jerline Pain, MD as PCP - Cardiology (Cardiology)  DIAGNOSIS:    ICD-10-CM   1. Malignant neoplasm of central portion of right breast in female, estrogen receptor negative ([place])  C50.111    Z17.1     SUMMARY OF ONCOLOGIC HISTORY: Oncology History  Cancer of central portion of right female breast ([place])  07/14/2012 Mammogram   And invasive mammary carcinoma with lymphovascular invasion grade 3 ER negative PR negative HER-2 positive inflammatory breast cancer   07/19/2012 Breast MRI   Right breast neoplasm level I and right axillary lymph node compatible with lymph node metastases biopsy proven to be invasive mammary cancer   07/23/2012 - 12/17/2012 Neo-Adjuvant Chemotherapy   Neoadjuvant FEC 100 followed by Taxol Herceptin due to neuropathy Taxol was replaced by gemcitabine x5 cycles   12/07/2012 -  Hospital Admission   Bilateral lower extremity DVT, workup showed prothrombin gene mutation heterozygous and IVC filter, bilateral pulmonary emboli and acute respiratory failure requiring vent support bronchoscopy and BAL concerning for adenocarcinoma, PET/CT negative   02/08/2013 Surgery   Right mastectomy showed complete response to chemotherapy in the breast and 11 lymph nodes negative   02/25/2013 - 10/14/2013 Chemotherapy   Herceptin maintenance for one year   03/31/2013 - 05/17/2013 Radiation Therapy   Adjuvant radiation therapy    05/10/2014 Surgery   Left mastectomy   09/29/2018 PET scan   No significant PET uptake in the subcentimeter solid right upper lobe lung nodule measuring 6 mm on the current study     CHIEF COMPLIANT: Surveillance of right breast cancer  INTERVAL HISTORY: Jessica Wells is a 74 y.o. with above-mentioned history of right breast cancer treated with neoadjuvant chemotherapy, right mastectomy, and radiation who is currently on surveillance. Chest CT on 10/24/20 showed continued mild  increase in the right upper lobe nodule, 0.5cm, and no new or progressive findings. She presents to the clinic today for annual follow-up.    ALLERGIES:  is allergic to anesthetics, amide; bactrim [sulfamethoxazole-trimethoprim]; sulfa antibiotics; codeine; codeine phosphate; eggs or egg-derived products; fish allergy; milk-related compounds; and other.  MEDICATIONS:  Current Outpatient Medications  Medication Sig Dispense Refill  . albuterol (PROAIR HFA) 108 (90 Base) MCG/ACT inhaler 4 Puffs every 4-6 hours as needed for cough or wheezing.  Use with spacer. 18 g 1  . atorvastatin (LIPITOR) 10 MG tablet Take 10 mg by mouth daily.    . B Complex-C (SUPER B COMPLEX PO) Take 1 tablet by mouth at bedtime.     . calcium-vitamin D (OSCAL WITH D) 500-200 MG-UNIT per tablet Take 1 tablet by mouth every other day.     Marland Kitchen EPINEPHrine 0.3 mg/0.3 mL IJ SOAJ injection Use as directed for severe allergic reaction 2 each 1  . fish oil-omega-3 fatty acids 1000 MG capsule Take 1 g by mouth at bedtime.     . Flaxseed, Linseed, (FLAX SEEDS PO) Take 1 tablet by mouth daily.    . fluticasone (FLONASE) 50 MCG/ACT nasal spray Place 1 spray into both nostrils as needed for allergies or rhinitis. 16 g 5  . glucosamine-chondroitin 500-400 MG tablet Take 1 tablet by mouth at bedtime.     . Influenza vac split quadrivalent PF (FLUZONE HIGH-DOSE) 0.5 ML injection Bring to office 0.5 mL 0  . lisinopril (PRINIVIL,ZESTRIL) 2.5 MG tablet Take 2.5 mg by mouth every evening.     . metoprolol succinate (TOPROL-XL) 50 MG 24 hr tablet  Take 1 tablet (50 mg total) by mouth daily. 90 tablet 3  . Multiple Vitamin (MULTIVITAMIN WITH MINERALS) TABS Take 1 tablet by mouth every other day.     Marland Kitchen omeprazole (PRILOSEC) 20 MG capsule Take 1 capsule (20 mg total) by mouth daily. For heartburn 30 capsule 5  . SYMBICORT 160-4.5 MCG/ACT inhaler INHALE 2 PUFFS BY MOUTH TWICE DAILY .  USE  WITH  SPACER. 10.2 g 5  . vitamin C (ASCORBIC ACID) 500 MG  tablet Take 500 mg by mouth 3 (three) times a week.     . vitamin E 1000 UNIT capsule Take 1,000 Units by mouth 3 (three) times a week.     Alveda Reasons 20 MG TABS tablet TAKE 1 TABLET BY MOUTH ONCE DAILY WITH SUPPER 90 tablet 0   No current facility-administered medications for this visit.    PHYSICAL EXAMINATION: ECOG PERFORMANCE STATUS: 1 - Symptomatic but completely ambulatory  Vitals:   10/29/20 1016  BP: (!) 144/71  Pulse: 75  Resp: 17  Temp: (!) 97.5 F (36.4 C)  SpO2: 99%   Filed Weights   10/29/20 1016  Weight: 147 lb 14.4 oz (67.1 kg)      LABORATORY DATA:  I have reviewed the data as listed CMP Latest Ref Rng & Units 10/24/2020 10/24/2019 09/15/2018  Glucose 70 - 99 mg/dL - - 150(H)  BUN 8 - 23 mg/dL - - 34(H)  Creatinine 0.44 - 1.00 mg/dL 1.00 0.90 1.03(H)  Sodium 135 - 145 mmol/L - - 140  Potassium 3.5 - 5.1 mmol/L - - 4.1  Chloride 98 - 111 mmol/L - - 103  CO2 22 - 32 mmol/L - - 29  Calcium 8.9 - 10.3 mg/dL - - 9.5  Total Protein 6.4 - 8.3 g/dL - - -  Total Bilirubin 0.20 - 1.20 mg/dL - - -  Alkaline Phos 40 - 150 U/L - - -  AST 5 - 34 U/L - - -  ALT 0 - 55 U/L - - -    Lab Results  Component Value Date   WBC 9.4 09/15/2018   HGB 13.8 09/15/2018   HCT 42.0 09/15/2018   MCV 93.8 09/15/2018   PLT 168 09/15/2018   NEUTROABS 6.8 09/15/2018    ASSESSMENT & PLAN:  Cancer of central portion of right female breast Right breast inflammation breast cancer diagnosed August 2013 treated with neoadjuvant chemotherapy completed 12/17/2012 hospitalized for bilateral DVT and respiratory failure, bronchoscopy showed adenocarcinoma, right mastectomy 02/08/2013 followed by Herceptin maintenance completed 10/14/2013, adjuvant radiation completed 05/17/2013.  Lung adenocarcinoma: Diagnosed in 2014 CT scans done11/24/21;negative for cancer, no significant change in the mixed solid and groundglass nodule right upper lobe 8 mm.  The solid component 5 mm.  Over the years  it has slightly increased in size but it has not changed since last year.  CT chest 10/21/20: Continued mild increase in size of part solid nodule within the central right upper lobe. The solid component of this nodule remains stable at 5 mm. Given the gradual overall increase in size of this lesion this is concerning for low-grade indolent neoplasm such as pulmonary adenocarcinoma. Continue annual CT is recommended until 5 years of stability has been established.  I will discuss this with our colleagues at the thoracic tumor board and make further decisions.  RTC in 1 year    No orders of the defined types were placed in this encounter.  The patient has a good understanding of the overall plan. she  agrees with it. she will call with any problems that may develop before the next visit here.  Total time spent: 20 mins including face to face time and time spent for planning, charting and coordination of care  Nicholas Lose, MD 10/29/2020  I, Cloyde Reams Dorshimer, am acting as scribe for Dr. Nicholas Lose.  I have reviewed the above documentation for accuracy and completeness, and I agree with the above.

## 2020-10-29 ENCOUNTER — Telehealth: Payer: Self-pay | Admitting: Hematology and Oncology

## 2020-10-29 ENCOUNTER — Inpatient Hospital Stay: Payer: Medicare Other | Attending: Hematology and Oncology | Admitting: Hematology and Oncology

## 2020-10-29 ENCOUNTER — Other Ambulatory Visit: Payer: Self-pay

## 2020-10-29 DIAGNOSIS — Z9013 Acquired absence of bilateral breasts and nipples: Secondary | ICD-10-CM | POA: Insufficient documentation

## 2020-10-29 DIAGNOSIS — Z79899 Other long term (current) drug therapy: Secondary | ICD-10-CM | POA: Diagnosis not present

## 2020-10-29 DIAGNOSIS — Z7901 Long term (current) use of anticoagulants: Secondary | ICD-10-CM | POA: Diagnosis not present

## 2020-10-29 DIAGNOSIS — Z923 Personal history of irradiation: Secondary | ICD-10-CM | POA: Insufficient documentation

## 2020-10-29 DIAGNOSIS — Z171 Estrogen receptor negative status [ER-]: Secondary | ICD-10-CM | POA: Insufficient documentation

## 2020-10-29 DIAGNOSIS — C50111 Malignant neoplasm of central portion of right female breast: Secondary | ICD-10-CM | POA: Diagnosis not present

## 2020-10-29 DIAGNOSIS — C3490 Malignant neoplasm of unspecified part of unspecified bronchus or lung: Secondary | ICD-10-CM

## 2020-10-29 DIAGNOSIS — Z9221 Personal history of antineoplastic chemotherapy: Secondary | ICD-10-CM | POA: Diagnosis not present

## 2020-10-29 NOTE — Telephone Encounter (Signed)
Scheduled appts per 11/29 los. Gave pt a print out of AVS.

## 2020-11-01 ENCOUNTER — Telehealth: Payer: Self-pay | Admitting: *Deleted

## 2020-11-01 ENCOUNTER — Other Ambulatory Visit: Payer: Self-pay | Admitting: *Deleted

## 2020-11-01 ENCOUNTER — Encounter: Payer: Self-pay | Admitting: *Deleted

## 2020-11-01 DIAGNOSIS — R911 Solitary pulmonary nodule: Secondary | ICD-10-CM

## 2020-11-01 NOTE — Progress Notes (Signed)
See telephone message

## 2020-11-01 NOTE — Progress Notes (Signed)
The proposed treatment discussed in cancer conference 12/2 is for discussion purpose only and is not a binding recommendation.  The patient was not physically examined nor present for their treatment options.  Therefore, final treatment plans cannot be decided.

## 2020-11-01 NOTE — Telephone Encounter (Signed)
Per Dr. Lindi Adie, patient's case was discussed at cancer conference today.  I updated him on recommendations.  He asked that I call the patient and re-refer patient to be seen with Dr. Lamonte Sakai.  I called patient with an update.  I will make the re-referral and notify Dr. Agustina Caroli office.

## 2020-11-07 ENCOUNTER — Other Ambulatory Visit: Payer: Self-pay

## 2020-11-07 ENCOUNTER — Encounter: Payer: Self-pay | Admitting: Cardiology

## 2020-11-07 ENCOUNTER — Ambulatory Visit (INDEPENDENT_AMBULATORY_CARE_PROVIDER_SITE_OTHER): Payer: Medicare Other | Admitting: Cardiology

## 2020-11-07 VITALS — BP 120/70 | HR 71 | Ht 61.0 in | Wt 148.0 lb

## 2020-11-07 DIAGNOSIS — E119 Type 2 diabetes mellitus without complications: Secondary | ICD-10-CM | POA: Diagnosis not present

## 2020-11-07 DIAGNOSIS — D6859 Other primary thrombophilia: Secondary | ICD-10-CM | POA: Diagnosis not present

## 2020-11-07 DIAGNOSIS — I471 Supraventricular tachycardia: Secondary | ICD-10-CM

## 2020-11-07 NOTE — Patient Instructions (Signed)
Medication Instructions:  Your physician recommends that you continue on your current medications as directed. Please refer to the Current Medication list given to you today.  *If you need a refill on your cardiac medications before your next appointment, please call your pharmacy*   Lab Work: None Ordered If you have labs (blood work) drawn today and your tests are completely normal, you will receive your results only by: Marland Kitchen MyChart Message (if you have MyChart) OR . A paper copy in the mail If you have any lab test that is abnormal or we need to change your treatment, we will call you to review the results.    Testing/Procedures: None Ordered    Follow-Up: At [facility], you and your health needs are our priority.  As part of our continuing mission to provide you with exceptional heart care, we have created designated Provider Care Teams.  These Care Teams include your primary Cardiologist (physician) and Advanced Practice Providers (APPs -  Physician Assistants and Nurse Practitioners) who all work together to provide you with the care you need, when you need it.   Your next appointment:   1 year(s)  The format for your next appointment:   In Person  Provider:   You may see Candee Furbish, MD or one of the following Advanced Practice Providers on your designated Care Team:    Truitt Merle, NP  Cecilie Kicks, NP  Kathyrn Drown, NP

## 2020-11-07 NOTE — Progress Notes (Signed)
Cardiology Office Note:    Date:  11/07/2020   ID:  Jessica Wells, DOB July 16, 1946, MRN [id number]  PCP:  Leighton Ruff, MD  [facility] Cardiologist:  Candee Furbish, MD  [facility] Electrophysiologist:  None   Referring MD: Leighton Ruff, MD     History of Present Illness:    Jessica Wells is a 74 y.o. female here for the follow-up of PSVT, paroxysmal atrial tachycardia.  DVT previously. Right-sided inflammatory breast cancer Chronic anticoagulation.  Previously had been seen by Dr. Haroldine Laws.  Was on Herceptin.  Occasional right arm lymphedema, wraps arm Ace wraps.  Last year reviewed note and she had occasional fullness and rare palpitations but does not "take off ".  Enjoys her hiking. Not having symptoms.   Denies any fevers chills nausea vomiting syncope bleeding.  Past Medical History:  Diagnosis Date  . Allergy   . Anemia    "after chemo"  . Arthritis    "knees; fingers; occasionally" (05/10/2014)  . Asthma    Uses Inhalers Proventil as needed  . Breast cancer ([place]) 07/14/12   inflammatory right breast ca, ER/PR -  . Bursitis    "both shoulders"  . Clotting disorder ([place])    prothrombin gene mutation-heterozygous   . DVT (deep venous thrombosis) ([place]) 2014   BLE  . Dysrhythmia    PAT-sees dr Lina Sayre meds  . GERD (gastroesophageal reflux disease)   . H/O hiatal hernia   . History of blood transfusion 01-25-13   2 units 01-11-13 ("after chem")  . History of chemotherapy    Last dose to be 02-04-13.  Was rx'd with Herceptin & Gemzar  . Lymphedema    RUE  . Osteopenia    "lower spine only" (05/10/2014)  . PAT (paroxysmal atrial tachycardia) (HCC)    hx  . Pneumonia 01-25-13   hx. 12-27-12-hospital stay x 9 days, now resolved.  . Pneumonia    "episodic; all my life" (05/10/2014)  . PONV (postoperative nausea and vomiting) 09-13-12   severe, with Port-a-cath, was managed without PONV  . S/P radiation therapy 4-12 wks ago 03/31/13-05/17/13   right  breast/supraclavicular fossa/posterior axillary boost    Past Surgical History:  Procedure Laterality Date  . BREAST BIOPSY Right 2013 X 3  . CHALAZION EXCISION Right 1980's   right( MD office)  . COLONOSCOPY    . DILATION AND CURETTAGE OF UTERUS  1998 X 3  . INSERTION OF VENA CAVA FILTER  12/2012  . MASTECTOMY Left 05/10/2014   PROPHYLACTIC   . MASTECTOMY MODIFIED RADICAL Right 02/08/2013   Procedure: MASTECTOMY MODIFIED RADICAL;  Surgeon: Rolm Bookbinder, MD;  Location: [facility];  Service: General;  Laterality: Right;  . MASTECTOMY MODIFIED RADICAL Right   . PORT-A-CATH REMOVAL  05/10/2014  . PORT-A-CATH REMOVAL N/A 05/10/2014   Procedure: REMOVAL PORT-A-CATH;  Surgeon: Rolm Bookbinder, MD;  Location: [facility];  Service: General;  Laterality: N/A;  . PORTACATH PLACEMENT  07/21/2012   Procedure: INSERTION PORT-A-CATH;  Surgeon: Rolm Bookbinder, MD;  Location: [place];  Service: General;  Laterality: Left;/ Replacement done 10'13  . SIMPLE MASTECTOMY WITH AXILLARY SENTINEL NODE BIOPSY Left 05/10/2014   Procedure: PROPHYLACTIC LEFT MASTECTOMY;  Surgeon: Rolm Bookbinder, MD;  Location: [place];  Service: General;  Laterality: Left;  . TONSILLECTOMY    . TUBAL LIGATION  ~ 1974  . TUMOR EXCISION Right 1970   giant cell, off my forearm"    Current Medications: Current Meds  Medication Sig  . albuterol (PROAIR  HFA) 108 (90 Base) MCG/ACT inhaler 4 Puffs every 4-6 hours as needed for cough or wheezing.  Use with spacer.  Marland Kitchen atorvastatin (LIPITOR) 10 MG tablet Take 10 mg by mouth daily.  . B Complex-C (SUPER B COMPLEX PO) Take 1 tablet by mouth at bedtime.   . calcium-vitamin D (OSCAL WITH D) 500-200 MG-UNIT per tablet Take 1 tablet by mouth every other day.   Marland Kitchen EPINEPHrine 0.3 mg/0.3 mL IJ SOAJ injection Use as directed for severe allergic reaction  . Flaxseed, Linseed, (FLAX SEEDS PO) Take 1 tablet by mouth daily.  . fluticasone (FLONASE) 50 MCG/ACT nasal spray Place 1 spray  into both nostrils as needed for allergies or rhinitis.  Marland Kitchen glucosamine-chondroitin 500-400 MG tablet Take 1 tablet by mouth at bedtime.   . Influenza vac split quadrivalent PF (FLUZONE HIGH-DOSE) 0.5 ML injection Bring to office  . lisinopril (PRINIVIL,ZESTRIL) 2.5 MG tablet Take 2.5 mg by mouth every evening.   . metoprolol succinate (TOPROL-XL) 50 MG 24 hr tablet Take 1 tablet (50 mg total) by mouth daily.  . Multiple Vitamin (MULTIVITAMIN WITH MINERALS) TABS Take 1 tablet by mouth every other day.   Marland Kitchen omeprazole (PRILOSEC) 20 MG capsule Take 1 capsule (20 mg total) by mouth daily. For heartburn  . SYMBICORT 160-4.5 MCG/ACT inhaler INHALE 2 PUFFS BY MOUTH TWICE DAILY .  USE  WITH  SPACER.  Alveda Reasons 20 MG TABS tablet TAKE 1 TABLET BY MOUTH ONCE DAILY WITH SUPPER     Allergies:   Anesthetics, amide; Bactrim [sulfamethoxazole-trimethoprim]; Sulfa antibiotics; Codeine; Codeine phosphate; Eggs or egg-derived products; Fish allergy; Milk-related compounds; and Other   Social History   Socioeconomic History  . Marital status: Divorced    Spouse name: Not on file  . Number of children: 1  . Years of education: Not on file  . Highest education level: Not on file  Occupational History    Employer: RETIRED    Comment: retired Brewing technologist  Tobacco Use  . Smoking status: Former Smoker    Packs/day: 1.00    Years: 15.00    Pack years: 15.00    Types: Cigarettes    Quit date: 07/21/1979    Years since quitting: 41.3  . Smokeless tobacco: Never Used  Vaping Use  . Vaping Use: Never used  Substance and Sexual Activity  . Alcohol use: No  . Drug use: No  . Sexual activity: Not Currently    Comment: menarche age 44, P 1, menopause age 31, HRT x 3-5 yrs, low dosage  Other Topics Concern  . Not on file  Social History Narrative  . Not on file   Social Determinants of Health   Financial Resource Strain:   . Difficulty of Paying Living Expenses: Not on file  Food Insecurity:   . Worried  About Charity fundraiser in the Last Year: Not on file  . Ran Out of Food in the Last Year: Not on file  Transportation Needs:   . Lack of Transportation (Medical): Not on file  . Lack of Transportation (Non-Medical): Not on file  Physical Activity:   . Days of Exercise per Week: Not on file  . Minutes of Exercise per Session: Not on file  Stress:   . Feeling of Stress : Not on file  Social Connections:   . Frequency of Communication with Friends and Family: Not on file  . Frequency of Social Gatherings with Friends and Family: Not on file  . Attends Religious Services: Not  on file  . Active Member of Clubs or Organizations: Not on file  . Attends Archivist Meetings: Not on file  . Marital Status: Not on file     Family History: The patient's family history includes Allergic rhinitis in her father, sister, and son; Asthma in her father, sister, and son; Breast cancer (age of onset: 15) in her mother; Cancer in her paternal uncle; Diabetes in her mother; Heart disease in her maternal aunt, maternal grandmother, maternal uncle, and mother; Hyperlipidemia in her mother; Hypertension in her mother; Lung cancer (age of onset: 50) in her father. There is no history of Angioedema, Eczema, Immunodeficiency, or Urticaria.  ROS:   Please see the history of present illness.    No fevers chills nausea vomiting syncope bleeding all other systems reviewed and are negative.  EKGs/Labs/Other Studies Reviewed:    The following studies were reviewed today:  ECHO: 07/22/12 EF 60-65% lateral s' 10.5  10/2012 EF 60-65% lateral s' 10.7  01/19/13 EF 55-60% lateral S' 13.0 No evidence of RV strain.  03/21/13 EF 55-60% lateral s' 9.8 (remeasured by me today) Global Strain -16  06/20/13 EF 55-60% Lateral S' 9.3 Global Strain -24  09/21/13 EF 60% Lateral s' 9.9 Global strain -19%   EKG:  EKG is  ordered today.  The ekg ordered today demonstrates sinus rhythm 71 no changes  Recent  Labs: 10/24/2020: Creatinine, Ser 1.00  Recent Lipid Panel    Component Value Date/Time   CHOL 214 (H) 05/20/2013 1526   TRIG 226 (H) 05/20/2013 1526   HDL 45 05/20/2013 1526   CHOLHDL 4.8 05/20/2013 1526   VLDL 45 (H) 05/20/2013 1526   LDLCALC 124 (H) 05/20/2013 1526    Physical Exam:    VS:  BP 120/70 (BP Location: Left Arm, Patient Position: Sitting, Cuff Size: Normal)   Pulse 71   Ht 5\' 1"  (1.549 m)   Wt 148 lb (67.1 kg)   LMP  (LMP Unknown)   SpO2 98%   BMI 27.96 kg/m     Wt Readings from Last 3 Encounters:  11/07/20 148 lb (67.1 kg)  10/29/20 147 lb 14.4 oz (67.1 kg)  11/08/19 148 lb 12.8 oz (67.5 kg)     GEN:  Well nourished, well developed in no acute distress HEENT: Normal NECK: No JVD; No carotid bruits LYMPHATICS: No lymphadenopathy CARDIAC: RRR, no murmurs, rubs, gallops RESPIRATORY:  Clear to auscultation without rales, wheezing or rhonchi  ABDOMEN: Soft, non-tender, non-distended MUSCULOSKELETAL:  No edema; No deformity  SKIN: Warm and dry NEUROLOGIC:  Alert and oriented x 3 PSYCHIATRIC:  Normal affect   ASSESSMENT:    1. PSVT (paroxysmal supraventricular tachycardia) ([place])   2. Hypercoagulable state ([place])   3. Type 2 diabetes mellitus without complication, without long-term current use of insulin (HCC)    PLAN:    In order of problems listed above:  PSVT/atrial tachycardia -Continuing with Toprol 50 mg a day.  Doing very well. -If symptoms were uncontrolled, could consider EP evaluation for ablation.  Prior DVT/breast cancer/hypercoagulable state -On chronic anticoagulation.  Has IVC filter in place 2013. -Prothrombin gene mutation heterozygous. -Continue with Xarelto.  Stable without any bleeding issues. -Has been on Herceptin.  Echo is reviewed as above.  Stable.  Lung adenocarcinoma -Diagnosed in 2014, recent CT scans are negative for cancer.  8 mm nodule no significant change.  Reviewed Dr. Geralyn Flash note, reviewing with tumor  board.  Alpha gal allergy -Used to wake up at night with  hives, almost an anaphylactic type reaction.  She avoids any "hoof based meat "  Diabetes mellitus -Hemoglobin A1c 7.3 from outside labs.  Working on this with diet and exercise.  May need Metformin etc. in the future.  This was discovered/triggered in the past with steroid use during chemotherapy.   Medication Adjustments/Labs and Tests Ordered: Current medicines are reviewed at length with the patient today.  Concerns regarding medicines are outlined above.  Orders Placed This Encounter  Procedures  . EKG 12-Lead   No orders of the defined types were placed in this encounter.   Patient Instructions  Medication Instructions:  Your physician recommends that you continue on your current medications as directed. Please refer to the Current Medication list given to you today.  *If you need a refill on your cardiac medications before your next appointment, please call your pharmacy*   Lab Work: None Ordered If you have labs (blood work) drawn today and your tests are completely normal, you will receive your results only by: Marland Kitchen MyChart Message (if you have MyChart) OR . A paper copy in the mail If you have any lab test that is abnormal or we need to change your treatment, we will call you to review the results.    Testing/Procedures: None Ordered    Follow-Up: At [facility], you and your health needs are our priority.  As part of our continuing mission to provide you with exceptional heart care, we have created designated Provider Care Teams.  These Care Teams include your primary Cardiologist (physician) and Advanced Practice Providers (APPs -  Physician Assistants and Nurse Practitioners) who all work together to provide you with the care you need, when you need it.   Your next appointment:   1 year(s)  The format for your next appointment:   In Person  Provider:   You may see Candee Furbish, MD or one of the  following Advanced Practice Providers on your designated Care Team:    Truitt Merle, NP  Cecilie Kicks, NP  Kathyrn Drown, NP        Signed, Candee Furbish, MD  11/07/2020 11:20 AM    [place]

## 2020-11-13 ENCOUNTER — Ambulatory Visit (INDEPENDENT_AMBULATORY_CARE_PROVIDER_SITE_OTHER): Payer: Medicare Other | Admitting: Emergency Medicine

## 2020-11-13 ENCOUNTER — Encounter: Payer: Self-pay | Admitting: Emergency Medicine

## 2020-11-13 ENCOUNTER — Other Ambulatory Visit: Payer: Self-pay

## 2020-11-13 VITALS — BP 118/80 | HR 79 | Temp 97.8°F | Ht 61.0 in | Wt 149.6 lb

## 2020-11-13 DIAGNOSIS — R911 Solitary pulmonary nodule: Secondary | ICD-10-CM | POA: Diagnosis not present

## 2020-11-13 DIAGNOSIS — J449 Chronic obstructive pulmonary disease, unspecified: Secondary | ICD-10-CM

## 2020-11-13 NOTE — Progress Notes (Signed)
Subjective:    Patient ID: Jessica Wells, female    DOB: 24-Jul-1946, 74 y.o.   MRN: [id number]  HPI 74 year old former smoker (15 pack years) with a history of heterozygous prothrombin 2 mutation with a DVT and pulmonary embolism on anticoagulation.  Also with a history of right breast cancer (mastectomy, neoadjuvant chemo, XRT), atrial tachycardia, GERD with hiatal hernia. Also with possible hx adenoCA lung - seen on a BAL when she was admitted with B infiltrates in 2014.   She is followed by Dr. Lindi Adie and has been having serial CT scans of the chest to follow a small mixed density right upper lobe pulmonary nodule.  This has been enlarging on serial CT scans most recent done 10/24/2020.  It was not hypermetabolic when it was first noted October 2019.  Showed very slight interval growth between November 2020 and 2021, with possibly a larger solid component.  She feels well. No symptoms, very active. No CP.    Review of Systems As per HPI  Past Medical History:  Diagnosis Date   Allergic reaction to alpha-gal    Allergy    Anemia    "after chemo"   Arthritis    "knees; fingers; occasionally" (05/10/2014)   Asthma    Uses Inhalers Proventil as needed   Breast cancer ([place]) 07/14/12   inflammatory right breast ca, ER/PR -   Bursitis    "both shoulders"   Clotting disorder ([place])    prothrombin gene mutation-heterozygous    DVT (deep venous thrombosis) ([place]) 12/22/2012   BLE   Dysrhythmia    PAT-sees dr Lina Sayre meds   Family history of breast cancer 04/04/2021   Family history of lung cancer 04/04/2021   GERD (gastroesophageal reflux disease)    H/O hiatal hernia    History of blood transfusion 01-25-13   2 units 01-11-13 ("after chem")   History of chemotherapy    Last dose to be 02-04-13.  Was rx'd with Herceptin & Gemzar   Lymphedema    RUE   Osteopenia    "lower spine only" (05/10/2014)   PAT (paroxysmal atrial tachycardia) (HCC)    hx   PE (pulmonary thromboembolism) ([place])  12/27/2012   Pneumonia 01-25-13   hx. 12-27-12-hospital stay x 9 days, now resolved.   Pneumonia    "episodic; all my life" (05/10/2014)   PONV (postoperative nausea and vomiting) 09-13-12   severe, with Port-a-cath, was managed without PONV   S/P radiation therapy 4-12 wks ago 03/31/13-05/17/13   right breast/supraclavicular fossa/posterior axillary boost     Family History  Problem Relation Age of Onset   Diabetes Mother    Heart disease Mother    Hypertension Mother    Hyperlipidemia Mother    Breast cancer Mother 87       lobular breast cancer   Heart disease Maternal Grandmother        died in her 16s from heart disease   Cancer Maternal Grandmother        unknown type; d. before age 69   Lung cancer Father 11       adenocarcinoma   Allergic rhinitis Father    Asthma Father    Skin cancer Father        forehead, dx after 65   Cancer Paternal Uncle        dx in mid 73s with a cancer in the leg, died late 30s; grandpaternal half uncle   Allergic rhinitis Sister    Asthma Sister  Lung cancer Sister 109       adenocarcinoma   Heart disease Maternal Aunt    Heart disease Maternal Uncle    Allergic rhinitis Son    Asthma Son    Breast cancer Niece 71   Angioedema Neg Hx    Eczema Neg Hx    Immunodeficiency Neg Hx    Urticaria Neg Hx      Social History   Socioeconomic History   Marital status: Divorced    Spouse name: Not on file   Number of children: 1   Years of education: Not on file   Highest education level: Not on file  Occupational History    Employer: RETIRED    Comment: retired Brewing technologist  Tobacco Use   Smoking status: Former    Packs/day: 1.00    Years: 15.00    Pack years: 15.00    Types: Cigarettes    Quit date: 07/21/1979    Years since quitting: 42.1   Smokeless tobacco: Never  Vaping Use   Vaping Use: Never used  Substance and Sexual Activity   Alcohol use: No   Drug use: No   Sexual activity: Not Currently    Comment: menarche age 27, 57  1, menopause age 29, HRT x 3-5 yrs, low dosage  Other Topics Concern   Not on file  Social History Narrative   Not on file   Social Determinants of Health   Financial Resource Strain: Not on file  Food Insecurity: Not on file  Transportation Needs: Not on file  Physical Activity: Not on file  Stress: Not on file  Social Connections: Not on file  Intimate Partner Violence: Not on file     Allergies  Allergen Reactions   Alpha-Gal Hives, Itching, Rash and Shortness Of Breath   Anesthetics, Amide Nausea And Vomiting    Projectile vomitting-Nausea- with 24hrs of dry heaves. With any anesthetics   Bactrim [Sulfamethoxazole-Trimethoprim] Other (See Comments)    Massive diarrhea, nausea vomiting, platelets dropped, hemoglobin dropped, admitted to hospital   Sulfa Antibiotics Nausea And Vomiting and Other (See Comments)    Crazy feeling in head Life threatening reaction, decreased blood counts   Anesthesia S-I-40 [Propofol]     Other reaction(s): NV   Codeine Nausea And Vomiting   Codeine Phosphate Nausea And Vomiting   Eggs Or Egg-Derived Products Hives   Fish Allergy    Milk-Related Compounds      Upset stomach    Other     All tree nuts itching,sneezing Other reaction(s): stomach upset     Outpatient Medications Prior to Visit  Medication Sig Dispense Refill   atorvastatin (LIPITOR) 10 MG tablet Take 10 mg by mouth daily.     B Complex-C (SUPER B COMPLEX PO) Take 1 tablet by mouth at bedtime.      calcium-vitamin D (OSCAL WITH D) 500-200 MG-UNIT per tablet Take 1 tablet by mouth every other day.      fluticasone (FLONASE) 50 MCG/ACT nasal spray Place 1 spray into both nostrils as needed for allergies or rhinitis. 16 g 5   glucosamine-chondroitin 500-400 MG tablet Take 1 tablet by mouth at bedtime.      lisinopril (PRINIVIL,ZESTRIL) 2.5 MG tablet Take 2.5 mg by mouth every evening.      Multiple Vitamin (MULTIVITAMIN WITH MINERALS) TABS Take 1 tablet by mouth every other  day.      omeprazole (PRILOSEC) 20 MG capsule Take 1 capsule (20 mg total) by mouth daily. For heartburn 30  capsule 5   albuterol (PROAIR HFA) 108 (90 Base) MCG/ACT inhaler 4 Puffs every 4-6 hours as needed for cough or wheezing.  Use with spacer. 18 g 1   EPINEPHrine 0.3 mg/0.3 mL IJ SOAJ injection Use as directed for severe allergic reaction 2 each 1   Flaxseed, Linseed, (FLAX SEEDS PO) Take 1 tablet by mouth daily. (Patient not taking: Reported on 01/02/2021)     Influenza vac split quadrivalent PF (FLUZONE HIGH-DOSE) 0.5 ML injection Bring to office 0.5 mL 0   metoprolol succinate (TOPROL-XL) 50 MG 24 hr tablet Take 1 tablet (50 mg total) by mouth daily. 90 tablet 3   SYMBICORT 160-4.5 MCG/ACT inhaler INHALE 2 PUFFS BY MOUTH TWICE DAILY .  USE  WITH  SPACER. (Patient taking differently: Inhale 1 puff into the lungs daily. INHALE 2 PUFFS BY MOUTH TWICE DAILY .  USE  WITH  SPACER.) 10.2 g 5   XARELTO 20 MG TABS tablet TAKE 1 TABLET BY MOUTH ONCE DAILY WITH SUPPER 90 tablet 0   No facility-administered medications prior to visit.         Objective:   Physical Exam Today's Vitals   11/13/20 1421  BP: 118/80  Pulse: 79  Temp: 97.8 F (36.6 C)  SpO2: 98%  Weight: 149 lb 9.6 oz (67.9 kg)  Height: _0  (1.549 m)   Body mass index is 28.27 kg/m.;s Gen: Pleasant, well-nourished, in no distress,  normal affect  ENT: No lesions,  mouth clear,  oropharynx clear, no postnasal drip  Neck: No JVD, no stridor  Lungs: No use of accessory muscles, no crackles or wheezing on normal respiration, no wheeze on forced expiration  Cardiovascular: RRR, heart sounds normal, no murmur or gallops, no peripheral edema  Musculoskeletal: No deformities, no cyanosis or clubbing  Neuro: alert, awake, non focal  Skin: Warm, no lesions or rash     Assessment & Plan:  Pulmonary nodule We will arrange for a PET scan We will perform full pulmonary function testing Follow-up next available with PFT.   We will review your studies at that time and decide next steps in evaluation of your pulmonary nodule.  Asthma with COPD ([place]) Okay to continue Symbicort 1 puff once daily for now.  Rinse and gargle after you use it. Keep your albuterol available to use 2 puffs if needed for shortness of breath  Baltazar Apo, MD, PhD 08/25/2021, 10:46 AM [facility] [phone] or if no answer [phone]

## 2020-11-13 NOTE — Patient Instructions (Signed)
We will arrange for a PET scan We will perform full pulmonary function testing Okay to continue Symbicort 1 puff once daily for now.  Rinse and gargle after you use it. Keep your albuterol available to use 2 puffs if needed for shortness of breath Follow-up next available with PFT.  We will review your studies at that time and decide next steps in evaluation of your pulmonary nodule.

## 2020-11-27 ENCOUNTER — Other Ambulatory Visit: Payer: Self-pay

## 2020-11-27 ENCOUNTER — Encounter (HOSPITAL_COMMUNITY)
Admission: RE | Admit: 2020-11-27 | Discharge: 2020-11-27 | Disposition: A | Discharge status: Home / Self Care | Payer: Medicare Other | Source: Ambulatory Visit | Attending: Emergency Medicine | Admitting: Emergency Medicine

## 2020-11-27 DIAGNOSIS — R911 Solitary pulmonary nodule: Secondary | ICD-10-CM | POA: Diagnosis not present

## 2020-11-27 DIAGNOSIS — I708 Atherosclerosis of other arteries: Secondary | ICD-10-CM | POA: Diagnosis not present

## 2020-11-27 DIAGNOSIS — I2510 Atherosclerotic heart disease of native coronary artery without angina pectoris: Secondary | ICD-10-CM | POA: Diagnosis not present

## 2020-11-27 DIAGNOSIS — Z853 Personal history of malignant neoplasm of breast: Secondary | ICD-10-CM | POA: Diagnosis not present

## 2020-11-27 DIAGNOSIS — C3490 Malignant neoplasm of unspecified part of unspecified bronchus or lung: Secondary | ICD-10-CM | POA: Diagnosis not present

## 2020-11-27 LAB — GLUCOSE, CAPILLARY: Glucose-Capillary: 138 mg/dL — ABNORMAL HIGH (ref 70–99)

## 2020-11-27 MED ORDER — FLUDEOXYGLUCOSE F - 18 (FDG) INJECTION
7.4200 | Freq: Once | INTRAVENOUS | Status: AC | PRN
  Administered 2020-11-27: 09:00:00 7.42 via INTRAVENOUS

## 2020-12-03 ENCOUNTER — Other Ambulatory Visit: Payer: Self-pay | Admitting: Hematology and Oncology

## 2020-12-03 DIAGNOSIS — C50111 Malignant neoplasm of central portion of right female breast: Secondary | ICD-10-CM

## 2020-12-24 ENCOUNTER — Other Ambulatory Visit (HOSPITAL_COMMUNITY)
Admission: RE | Admit: 2020-12-24 | Discharge: 2020-12-24 | Disposition: A | Discharge status: Home / Self Care | Payer: Medicare Other | Source: Ambulatory Visit | Attending: Emergency Medicine | Admitting: Emergency Medicine

## 2020-12-24 DIAGNOSIS — Z20822 Contact with and (suspected) exposure to covid-19: Secondary | ICD-10-CM | POA: Insufficient documentation

## 2020-12-24 DIAGNOSIS — Z01812 Encounter for preprocedural laboratory examination: Secondary | ICD-10-CM | POA: Insufficient documentation

## 2020-12-24 LAB — SARS CORONAVIRUS 2 (TAT 6-24 HRS): SARS Coronavirus 2: NEGATIVE

## 2020-12-26 ENCOUNTER — Other Ambulatory Visit: Payer: Self-pay | Admitting: *Deleted

## 2020-12-26 ENCOUNTER — Other Ambulatory Visit: Payer: Self-pay

## 2020-12-26 ENCOUNTER — Ambulatory Visit: Payer: Medicare Other | Admitting: Emergency Medicine

## 2020-12-26 ENCOUNTER — Ambulatory Visit (INDEPENDENT_AMBULATORY_CARE_PROVIDER_SITE_OTHER): Payer: Medicare Other | Admitting: Emergency Medicine

## 2020-12-26 DIAGNOSIS — R911 Solitary pulmonary nodule: Secondary | ICD-10-CM

## 2020-12-26 LAB — PULMONARY FUNCTION TEST
DL/VA % pred: 127 %
DL/VA: 5.36 ml/min/mmHg/L
DLCO cor % pred: 128 %
DLCO cor: 22.1 ml/min/mmHg
DLCO unc % pred: 128 %
DLCO unc: 22.1 ml/min/mmHg
FEF 25-75 Post: 1.79 L/sec
FEF 25-75 Pre: 1.3 L/sec
FEF2575-%Change-Post: 37 %
FEF2575-%Pred-Post: 120 %
FEF2575-%Pred-Pre: 87 %
FEV1-%Change-Post: 9 %
FEV1-%Pred-Post: 97 %
FEV1-%Pred-Pre: 88 %
FEV1-Post: 1.78 L
FEV1-Pre: 1.63 L
FEV1FVC-%Change-Post: 0 %
FEV1FVC-%Pred-Pre: 103 %
FEV6-%Change-Post: 10 %
FEV6-%Pred-Post: 99 %
FEV6-%Pred-Pre: 90 %
FEV6-Post: 2.32 L
FEV6-Pre: 2.1 L
FEV6FVC-%Pred-Post: 105 %
FEV6FVC-%Pred-Pre: 105 %
FVC-%Change-Post: 10 %
FVC-%Pred-Post: 94 %
FVC-%Pred-Pre: 85 %
FVC-Post: 2.32 L
FVC-Pre: 2.1 L
Post FEV1/FVC ratio: 77 %
Post FEV6/FVC ratio: 100 %
Pre FEV1/FVC ratio: 78 %
Pre FEV6/FVC Ratio: 100 %
RV % pred: 58 %
RV: 1.25 L
TLC % pred: 74 %
TLC: 3.41 L

## 2020-12-26 NOTE — Progress Notes (Signed)
Full PFT performed today. °

## 2020-12-31 DIAGNOSIS — E7800 Pure hypercholesterolemia, unspecified: Secondary | ICD-10-CM | POA: Diagnosis not present

## 2020-12-31 DIAGNOSIS — J4540 Moderate persistent asthma, uncomplicated: Secondary | ICD-10-CM | POA: Diagnosis not present

## 2020-12-31 DIAGNOSIS — E119 Type 2 diabetes mellitus without complications: Secondary | ICD-10-CM | POA: Diagnosis not present

## 2020-12-31 DIAGNOSIS — Z853 Personal history of malignant neoplasm of breast: Secondary | ICD-10-CM | POA: Diagnosis not present

## 2020-12-31 DIAGNOSIS — M8580 Other specified disorders of bone density and structure, unspecified site: Secondary | ICD-10-CM | POA: Diagnosis not present

## 2021-01-02 ENCOUNTER — Encounter: Payer: Self-pay | Admitting: Emergency Medicine

## 2021-01-02 ENCOUNTER — Ambulatory Visit (INDEPENDENT_AMBULATORY_CARE_PROVIDER_SITE_OTHER): Payer: Medicare Other | Admitting: Emergency Medicine

## 2021-01-02 ENCOUNTER — Other Ambulatory Visit: Payer: Self-pay

## 2021-01-02 VITALS — BP 136/78 | HR 77 | Temp 97.2°F | Ht 61.0 in | Wt 146.2 lb

## 2021-01-02 DIAGNOSIS — R911 Solitary pulmonary nodule: Secondary | ICD-10-CM

## 2021-01-02 DIAGNOSIS — J4520 Mild intermittent asthma, uncomplicated: Secondary | ICD-10-CM

## 2021-01-02 NOTE — Progress Notes (Signed)
Subjective:    Patient ID: Jessica Wells, female    DOB: September 19, 1946, 75 y.o.   MRN: [id number]  HPI 75 year old former smoker (15 pack years) with a history of heterozygous prothrombin 2 mutation with a DVT and pulmonary embolism on anticoagulation.  Also with a history of right breast cancer (mastectomy, neoadjuvant chemo, XRT), atrial tachycardia, GERD with hiatal hernia. Also with possible hx adenoCA lung - seen on a BAL when she was admitted with B infiltrates in 2014.   She is followed by Dr. Lindi Adie and has been having serial CT scans of the chest to follow a small mixed density right upper lobe pulmonary nodule.  This has been enlarging on serial CT scans most recent done 10/24/2020.  It was not hypermetabolic when it was first noted October 2019.  Showed very slight interval growth between November 2020 and 2021, with possibly a larger solid component.  ROV 01/02/21 --Jessica Wells is 74 and follows up today for further evaluation of a slowly enlarging mixed density right upper lobe pulmonary nodule on serial CT scans.  She also has a history of prothrombin 2 mutation, DVT and PE, prior right breast cancer, GERD with hiatal hernia.  There are some question about whether she may have had adenocarcinoma on BAL when she was admitted with bilateral infiltrates in 2014, unclear whether this was a formal diagnosis. She is on Symbicort once a day, not sure whether she benefits from it. Use ventolin very rarely.   PET scan performed 11/27/2020 reviewed by me, shows the subsolid right upper lobe nodule is stable in size and has mild hypermetabolism (SUV max 1.47), there is no distant disease, no enlarged hypermetabolic mediastinal or hilar nodes.  No evidence of distant disease.  She underwent pulmonary function testing 12/26/2020 which I have reviewed, mild obstruction with a borderline bronchodilator response, curve her flow volume loop consistent with obstructive disease.  Coexisting restriction on lung  volumes.  Supranormal DLCO.  All consistent with mild obstructive lung disease.    Review of Systems As per HPI  Past Medical History:  Diagnosis Date  . Allergy   . Anemia    "after chemo"  . Arthritis    "knees; fingers; occasionally" (05/10/2014)  . Asthma    Uses Inhalers Proventil as needed  . Breast cancer ([place]) 07/14/12   inflammatory right breast ca, ER/PR -  . Bursitis    "both shoulders"  . Clotting disorder ([place])    prothrombin gene mutation-heterozygous   . DVT (deep venous thrombosis) ([place]) 2014   BLE  . Dysrhythmia    PAT-sees dr Lina Sayre meds  . GERD (gastroesophageal reflux disease)   . H/O hiatal hernia   . History of blood transfusion 01-25-13   2 units 01-11-13 ("after chem")  . History of chemotherapy    Last dose to be 02-04-13.  Was rx'd with Herceptin & Gemzar  . Lymphedema    RUE  . Osteopenia    "lower spine only" (05/10/2014)  . PAT (paroxysmal atrial tachycardia) ([facility])    hx  . Pneumonia 01-25-13   hx. 12-27-12-hospital stay x 9 days, now resolved.  . Pneumonia    "episodic; all my life" (05/10/2014)  . PONV (postoperative nausea and vomiting) 09-13-12   severe, with Port-a-cath, was managed without PONV  . S/P radiation therapy 4-12 wks ago 03/31/13-05/17/13   right breast/supraclavicular fossa/posterior axillary boost     Family History  Problem Relation Age of Onset  . Diabetes Mother   . Heart  disease Mother   . Hypertension Mother   . Hyperlipidemia Mother   . Breast cancer Mother 77       lobular breast cancer  . Heart disease Maternal Grandmother        died in her 31s from heart disease  . Lung cancer Father 4       adenocarcinoma  . Allergic rhinitis Father   . Asthma Father   . Cancer Paternal Uncle        dx in mid 64s with a cancer in the leg, died late 1s; grandpaternal half uncle  . Allergic rhinitis Sister   . Asthma Sister   . Heart disease Maternal Aunt   . Heart disease Maternal Uncle   . Allergic rhinitis Son   .  Asthma Son   . Angioedema Neg Hx   . Eczema Neg Hx   . Immunodeficiency Neg Hx   . Urticaria Neg Hx      Social History   Socioeconomic History  . Marital status: Divorced    Spouse name: Not on file  . Number of children: 1  . Years of education: Not on file  . Highest education level: Not on file  Occupational History    Employer: RETIRED    Comment: retired Brewing technologist  Tobacco Use  . Smoking status: Former Smoker    Packs/day: 1.00    Years: 15.00    Pack years: 15.00    Types: Cigarettes    Quit date: 07/21/1979    Years since quitting: 41.4  . Smokeless tobacco: Never Used  Vaping Use  . Vaping Use: Never used  Substance and Sexual Activity  . Alcohol use: No  . Drug use: No  . Sexual activity: Not Currently    Comment: menarche age 65, P 1, menopause age 73, HRT x 3-5 yrs, low dosage  Other Topics Concern  . Not on file  Social History Narrative  . Not on file   Social Determinants of Health   Financial Resource Strain: Not on file  Food Insecurity: Not on file  Transportation Needs: Not on file  Physical Activity: Not on file  Stress: Not on file  Social Connections: Not on file  Intimate Partner Violence: Not on file     Allergies  Allergen Reactions  . Anesthetics, Amide Nausea And Vomiting    Projectile vomitting-Nausea- with 24hrs of dry heaves. With any anesthetics  . Bactrim [Sulfamethoxazole-Trimethoprim] Other (See Comments)    Massive diarrhea, nausea vomiting, platelets dropped, hemoglobin dropped, admitted to hospital  . Sulfa Antibiotics Nausea And Vomiting and Other (See Comments)    Crazy feeling in head Life threatening reaction, decreased blood counts  . Codeine Nausea And Vomiting  . Codeine Phosphate Nausea And Vomiting  . Eggs Or Egg-Derived Products Hives  . Fish Allergy   . Milk-Related Compounds      Upset stomach   . Other     All tree nuts itching,sneezing     Outpatient Medications Prior to Visit  Medication Sig  Dispense Refill  . albuterol (PROAIR HFA) 108 (90 Base) MCG/ACT inhaler 4 Puffs every 4-6 hours as needed for cough or wheezing.  Use with spacer. 18 g 1  . atorvastatin (LIPITOR) 10 MG tablet Take 10 mg by mouth daily.    . B Complex-C (SUPER B COMPLEX PO) Take 1 tablet by mouth at bedtime.     . calcium-vitamin D (OSCAL WITH D) 500-200 MG-UNIT per tablet Take 1 tablet by mouth every other  day.     Marland Kitchen EPINEPHrine 0.3 mg/0.3 mL IJ SOAJ injection Use as directed for severe allergic reaction 2 each 1  . fluticasone (FLONASE) 50 MCG/ACT nasal spray Place 1 spray into both nostrils as needed for allergies or rhinitis. 16 g 5  . glucosamine-chondroitin 500-400 MG tablet Take 1 tablet by mouth at bedtime.     . Influenza vac split quadrivalent PF (FLUZONE HIGH-DOSE) 0.5 ML injection Bring to office 0.5 mL 0  . lisinopril (PRINIVIL,ZESTRIL) 2.5 MG tablet Take 2.5 mg by mouth every evening.     . metoprolol succinate (TOPROL-XL) 50 MG 24 hr tablet Take 1 tablet (50 mg total) by mouth daily. 90 tablet 3  . Multiple Vitamin (MULTIVITAMIN WITH MINERALS) TABS Take 1 tablet by mouth every other day.     Marland Kitchen omeprazole (PRILOSEC) 20 MG capsule Take 1 capsule (20 mg total) by mouth daily. For heartburn 30 capsule 5  . SYMBICORT 160-4.5 MCG/ACT inhaler INHALE 2 PUFFS BY MOUTH TWICE DAILY .  USE  WITH  SPACER. (Patient taking differently: Inhale 1 puff into the lungs daily. INHALE 2 PUFFS BY MOUTH TWICE DAILY .  USE  WITH  SPACER.) 10.2 g 5  . XARELTO 20 MG TABS tablet TAKE 1 TABLET BY MOUTH ONCE DAILY WITH SUPPER 90 tablet 0  . Flaxseed, Linseed, (FLAX SEEDS PO) Take 1 tablet by mouth daily. (Patient not taking: Reported on 01/02/2021)     No facility-administered medications prior to visit.         Objective:   Physical Exam Today's Vitals   01/02/21 1356  BP: 136/78  Pulse: 77  Temp: (!) 97.2 F (36.2 C)  SpO2: 99%  Weight: 146 lb 3.2 oz (66.3 kg)  Height: _0  (1.549 m)   Body mass index is 27.62  kg/m.;s Gen: Pleasant, well-nourished, in no distress,  normal affect  ENT: No lesions,  mouth clear,  oropharynx clear, no postnasal drip  Neck: No JVD, no stridor  Lungs: No use of accessory muscles, no crackles or wheezing on normal respiration, no wheeze on forced expiration  Cardiovascular: RRR, heart sounds normal, no murmur or gallops, no peripheral edema  Musculoskeletal: No deformities, no cyanosis or clubbing  Neuro: alert, awake, non focal  Skin: Warm, no lesions or rash     Assessment & Plan:  Asthma Mild obstruction confirmed on her pulmonary function testing 1/26.  She is using Symbicort, only once a day.  Appears to be benefiting.  Never needs albuterol.  Her PFT are such that she could tolerate a procedure if we decide to pursue resection of her nodule  Pulmonary nodule Mixed density small right upper lobe pulmonary nodule, very mild hypermetabolism on PET scan but based on mixed density status and size sensitivity may have been low.  Overall the course has been most consistent with suspected slow-growing adenocarcinoma.  As mentioned she has a BAL from 2014 showed adenocarcinoma (?  Primary lung,?  Inaccurate).  I suppose it is possible that if she did have adenocarcinoma in 2014 that it was treated by the chemotherapy that she received for her breast cancer.  The current nodule is isolated.  We talked today about possible primary resection.  I think she is a good candidate for this based on PFT.  She may need dye marking in order to localize the nodule.  I will refer her to thoracic surgery to discuss pros and cons  Baltazar Apo, MD, PhD 01/02/2021, 4:16 PM [facility] [phone] or if no answer [phone]

## 2021-01-02 NOTE — Assessment & Plan Note (Signed)
Mixed density small right upper lobe pulmonary nodule, very mild hypermetabolism on PET scan but based on mixed density status and size sensitivity may have been low.  Overall the course has been most consistent with suspected slow-growing adenocarcinoma.  As mentioned she has a BAL from 2014 showed adenocarcinoma (?  Primary lung,?  Inaccurate).  I suppose it is possible that if she did have adenocarcinoma in 2014 that it was treated by the chemotherapy that she received for her breast cancer.  The current nodule is isolated.  We talked today about possible primary resection.  I think she is a good candidate for this based on PFT.  She may need dye marking in order to localize the nodule.  I will refer her to thoracic surgery to discuss pros and cons

## 2021-01-02 NOTE — Assessment & Plan Note (Signed)
Mild obstruction confirmed on her pulmonary function testing 1/26.  She is using Symbicort, only once a day.  Appears to be benefiting.  Never needs albuterol.  Her PFT are such that she could tolerate a procedure if we decide to pursue resection of her nodule

## 2021-01-02 NOTE — Patient Instructions (Signed)
We will refer you to see thoracic surgery to discuss possible resection of your small right lower lobe pulmonary nodule.  We may decide to pursue this in combination with a procedure that would allow [place] to do dye marking on the nodule so that it may be easily isolated Continue your Symbicort once daily.  Rinse and gargle after using. Keep your albuterol available to use 2 puffs when you need if short of breath, chest tightness, wheezing. Follow Dr. Lamonte Sakai in 1 month or next available.

## 2021-01-07 ENCOUNTER — Other Ambulatory Visit: Payer: Self-pay

## 2021-01-07 ENCOUNTER — Encounter: Payer: Self-pay | Admitting: Thoracic Surgery (Cardiothoracic Vascular Surgery)

## 2021-01-07 ENCOUNTER — Institutional Professional Consult (permissible substitution) (INDEPENDENT_AMBULATORY_CARE_PROVIDER_SITE_OTHER): Payer: Medicare Other | Admitting: Thoracic Surgery (Cardiothoracic Vascular Surgery)

## 2021-01-07 VITALS — BP 170/74 | HR 59 | Temp 97.9°F | Resp 18 | Ht 61.0 in | Wt 147.0 lb

## 2021-01-07 DIAGNOSIS — R911 Solitary pulmonary nodule: Secondary | ICD-10-CM

## 2021-01-07 NOTE — H&P (View-Only) (Signed)
PCP is Leighton Ruff, MD Referring Provider is Collene Gobble, MD  Chief Complaint  Patient presents with  . Lung Lesion    New patient consultation, PFTs1/26/22, PET 11/27/20, Chest CT 10/24/20    HPI: Ms. Vinal is sent for consultation regarding a right upper lobe lung nodule.  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  She has a remote history of smoking about a pack a day for 15 years prior to quitting in 1980.  In 2013 she had inflammatory breast cancer.  She was treated with chemo radiation and then had mastectomy.  She had a left mastectomy later as well.  She presented with respiratory failure during chemotherapy.  She was found to have DVT and PE.  She had a filter placed and was anticoagulated.  She was intubated and apparently bronchial washings were sent for cytology.  That showed adenocarcinoma lung primary.  There was no lung nodule apparent at that time.  She completed treatment for her breast cancer.  She has been followed since then with CTs.  She was first noted to have a small subsolid nodule in the right upper lobe in 2017.  Over time it has gradually gotten larger.  There is a small solid component.  In November her CT showed an increase in size of the nodule.  On PET CT, it was mildly hypermetabolic with an SUV of 6.14.  She remains active although her exercise somewhat limited by arthritis in her knees.  She is not having any chest pain, pressure, tightness, or shortness of breath.  She uses her Symbicort inhaler once a day.  She rarely if ever uses her albuterol inhaler.  She denies any change in appetite or weight loss.  No unusual headaches or visual changes.  Zubrod Score: At the time of surgery this patient's most appropriate activity status/level should be described as: [x]     0    Normal  activity, no symptoms []     1    Restricted in physical strenuous activity but ambulatory, able to do out light work []     2    Ambulatory and capable of self care, unable to do work activities, up and about >50 % of waking hours                              []     3    Only limited self care, in bed greater than 50% of waking hours []     4    Completely disabled, no self care, confined to bed or chair []     5    Moribund  Past Medical History:  Diagnosis Date  . Allergy   . Anemia    "after chemo"  . Arthritis    "knees; fingers; occasionally" (05/10/2014)  . Asthma    Uses Inhalers Proventil as needed  . Breast cancer ([place]) 07/14/12   inflammatory right breast ca, ER/PR -  . Bursitis    "both shoulders"  . Clotting disorder ([place])    prothrombin gene mutation-heterozygous   . DVT (deep venous thrombosis) ([place]) 2014   BLE  . Dysrhythmia    PAT-sees dr Lina Sayre meds  . GERD (gastroesophageal reflux disease)   . H/O hiatal hernia   . History of blood transfusion 01-25-13   2 units 01-11-13 ("after  chem")  . History of chemotherapy    Last dose to be 02-04-13.  Was rx'd with Herceptin & Gemzar  . Lymphedema    RUE  . Osteopenia    "lower spine only" (05/10/2014)  . PAT (paroxysmal atrial tachycardia) ([facility])    hx  . Pneumonia 01-25-13   hx. 12-27-12-hospital stay x 9 days, now resolved.  . Pneumonia    "episodic; all my life" (05/10/2014)  . PONV (postoperative nausea and vomiting) 09-13-12   severe, with Port-a-cath, was managed without PONV  . S/P radiation therapy 4-12 wks ago 03/31/13-05/17/13   right breast/supraclavicular fossa/posterior axillary boost    Past Surgical History:  Procedure Laterality Date  . BREAST BIOPSY Right 2013 X 3  . CHALAZION EXCISION Right 1980's   right( MD office)  . COLONOSCOPY    . DILATION AND CURETTAGE OF UTERUS  1998 X 3  . INSERTION OF VENA CAVA FILTER  12/2012  . MASTECTOMY Left 05/10/2014   PROPHYLACTIC   . MASTECTOMY MODIFIED RADICAL  Right 02/08/2013   Procedure: MASTECTOMY MODIFIED RADICAL;  Surgeon: Rolm Bookbinder, MD;  Location: [facility];  Service: General;  Laterality: Right;  . MASTECTOMY MODIFIED RADICAL Right   . PORT-A-CATH REMOVAL  05/10/2014  . PORT-A-CATH REMOVAL N/A 05/10/2014   Procedure: REMOVAL PORT-A-CATH;  Surgeon: Rolm Bookbinder, MD;  Location: [facility];  Service: General;  Laterality: N/A;  . PORTACATH PLACEMENT  07/21/2012   Procedure: INSERTION PORT-A-CATH;  Surgeon: Rolm Bookbinder, MD;  Location: [facility];  Service: General;  Laterality: Left;/ Replacement done 10'13  . SIMPLE MASTECTOMY WITH AXILLARY SENTINEL NODE BIOPSY Left 05/10/2014   Procedure: PROPHYLACTIC LEFT MASTECTOMY;  Surgeon: Rolm Bookbinder, MD;  Location: [facility];  Service: General;  Laterality: Left;  . TONSILLECTOMY    . TUBAL LIGATION  ~ 1974  . TUMOR EXCISION Right 1970   giant cell, off my forearm"    Family History  Problem Relation Age of Onset  . Diabetes Mother   . Heart disease Mother   . Hypertension Mother   . Hyperlipidemia Mother   . Breast cancer Mother 5       lobular breast cancer  . Heart disease Maternal Grandmother        died in her 37s from heart disease  . Lung cancer Father 37       adenocarcinoma  . Allergic rhinitis Father   . Asthma Father   . Cancer Paternal Uncle        dx in mid 35s with a cancer in the leg, died late 56s; grandpaternal half uncle  . Allergic rhinitis Sister   . Asthma Sister   . Heart disease Maternal Aunt   . Heart disease Maternal Uncle   . Allergic rhinitis Son   . Asthma Son   . Angioedema Neg Hx   . Eczema Neg Hx   . Immunodeficiency Neg Hx   . Urticaria Neg Hx     Social History Social History   Tobacco Use  . Smoking status: Former Smoker    Packs/day: 1.00    Years: 15.00    Pack years: 15.00    Types: Cigarettes    Quit date: 07/21/1979    Years since quitting: 41.4  . Smokeless tobacco: Never Used  Vaping Use  . Vaping Use: Never  used  Substance Use Topics  . Alcohol use: No  . Drug use: No    Current Outpatient Medications  Medication Sig Dispense Refill  . albuterol (  PROAIR HFA) 108 (90 Base) MCG/ACT inhaler 4 Puffs every 4-6 hours as needed for cough or wheezing.  Use with spacer. 18 g 1  . atorvastatin (LIPITOR) 10 MG tablet Take 10 mg by mouth daily.    . B Complex-C (SUPER B COMPLEX PO) Take 1 tablet by mouth at bedtime.     . calcium-vitamin D (OSCAL WITH D) 500-200 MG-UNIT per tablet Take 1 tablet by mouth every other day.     Marland Kitchen EPINEPHrine 0.3 mg/0.3 mL IJ SOAJ injection Use as directed for severe allergic reaction 2 each 1  . fluticasone (FLONASE) 50 MCG/ACT nasal spray Place 1 spray into both nostrils as needed for allergies or rhinitis. 16 g 5  . glucosamine-chondroitin 500-400 MG tablet Take 1 tablet by mouth at bedtime.     . Influenza vac split quadrivalent PF (FLUZONE HIGH-DOSE) 0.5 ML injection Bring to office 0.5 mL 0  . lisinopril (PRINIVIL,ZESTRIL) 2.5 MG tablet Take 2.5 mg by mouth every evening.     . metoprolol succinate (TOPROL-XL) 50 MG 24 hr tablet Take 1 tablet (50 mg total) by mouth daily. 90 tablet 3  . Multiple Vitamin (MULTIVITAMIN WITH MINERALS) TABS Take 1 tablet by mouth every other day.     Marland Kitchen omeprazole (PRILOSEC) 20 MG capsule Take 1 capsule (20 mg total) by mouth daily. For heartburn 30 capsule 5  . SYMBICORT 160-4.5 MCG/ACT inhaler INHALE 2 PUFFS BY MOUTH TWICE DAILY .  USE  WITH  SPACER. (Patient taking differently: Inhale 1 puff into the lungs daily. INHALE 2 PUFFS BY MOUTH TWICE DAILY .  USE  WITH  SPACER.) 10.2 g 5  . XARELTO 20 MG TABS tablet TAKE 1 TABLET BY MOUTH ONCE DAILY WITH SUPPER 90 tablet 0  . Flaxseed, Linseed, (FLAX SEEDS PO) Take 1 tablet by mouth daily. (Patient not taking: Reported on 01/02/2021)     No current facility-administered medications for this visit.    Allergies  Allergen Reactions  . Anesthetics, Amide Nausea And Vomiting    Projectile  vomitting-Nausea- with 24hrs of dry heaves. With any anesthetics  . Bactrim [Sulfamethoxazole-Trimethoprim] Other (See Comments)    Massive diarrhea, nausea vomiting, platelets dropped, hemoglobin dropped, admitted to hospital  . Sulfa Antibiotics Nausea And Vomiting and Other (See Comments)    Crazy feeling in head Life threatening reaction, decreased blood counts  . Codeine Nausea And Vomiting  . Codeine Phosphate Nausea And Vomiting  . Eggs Or Egg-Derived Products Hives  . Fish Allergy   . Milk-Related Compounds      Upset stomach   . Other     All tree nuts itching,sneezing    Review of Systems  Constitutional: Negative for activity change and unexpected weight change.  HENT: Negative for trouble swallowing and voice change.   Respiratory: Negative for cough, shortness of breath and wheezing.   Cardiovascular: Negative for chest pain.  Gastrointestinal: Positive for abdominal pain (Reflux).  Genitourinary: Negative for difficulty urinating and dysuria.  Musculoskeletal: Positive for arthralgias and joint swelling.  Neurological: Negative for seizures, syncope and weakness.  Hematological: Negative for adenopathy. Bruises/bleeds easily.  All other systems reviewed and are negative.   BP (!) 170/74 (BP Location: Left Arm, Patient Position: Sitting, Cuff Size: Normal)   Pulse (!) 59   Temp 97.9 F (36.6 C) (Skin)   Resp 18   Ht 5\' 1"  (1.549 m)   Wt 147 lb (66.7 kg)   LMP  (LMP Unknown)   SpO2 98% Comment: RA  BMI 27.78 kg/m  Physical Exam Vitals reviewed.  Constitutional:      General: She is not in acute distress.    Appearance: Normal appearance.  HENT:     Head: Normocephalic and atraumatic.  Eyes:     General: No scleral icterus.    Extraocular Movements: Extraocular movements intact.  Cardiovascular:     Rate and Rhythm: Normal rate and regular rhythm.     Pulses: Normal pulses.     Heart sounds: Normal heart sounds. No murmur heard. No friction rub. No  gallop.   Pulmonary:     Effort: Pulmonary effort is normal. No respiratory distress.     Breath sounds: Normal breath sounds. No wheezing.  Abdominal:     General: There is no distension.     Palpations: Abdomen is soft.  Musculoskeletal:        General: No swelling.     Cervical back: Neck supple. No rigidity.     Comments: Status post bilateral mastectomy  Lymphadenopathy:     Cervical: No cervical adenopathy.  Skin:    General: Skin is warm and dry.  Neurological:     General: No focal deficit present.     Mental Status: She is alert and oriented to person, place, and time.     Cranial Nerves: No cranial nerve deficit.     Motor: No weakness.     Gait: Gait normal.    Diagnostic Tests: CT CHEST WITH CONTRAST  TECHNIQUE: Multidetector CT imaging of the chest was performed during intravenous contrast administration.  CONTRAST:  64mL OMNIPAQUE IOHEXOL 300 MG/ML  SOLN  COMPARISON:  10/24/2019  FINDINGS: Cardiovascular: The heart size is normal. No pericardial effusion identified. Mild coronary artery calcifications.  Mediastinum/Nodes: Normal appearance of the thyroid gland. The trachea appears patent and is midline. Normal appearance of the esophagus.  Previous right axillary node dissection. No enlarged axillary, supraclavicular, mediastinal, or hilar lymph nodes.  Lungs/Pleura: No pleural effusion. Subpleural fibrotic changes within the right upper lobe anteriorly most likely reflect changes secondary to external beam radiation.  The part solid nodule within the central right upper lobe is again noted. This measures 1 cm in maximum dimension, image 59/7. The solid component measures 5 mm, image 60/7. Previously this measured 8 mm with a solid component measuring 5 mm.  3 mm solid nodule within the lateral left upper lobe is unchanged, image 65/7.  No new lung nodules.  Upper Abdomen: Moderate size hiatal hernia. No acute findings identified  within the imaged portions of the upper abdomen.  Musculoskeletal: Status post right mastectomy. No acute or suspicious osseous findings.  IMPRESSION: 1. Continued mild increase in size of part solid nodule within the central right upper lobe. The solid component of this nodule remains stable at 5 mm. Given the gradual overall increase in size of this lesion this is concerning for low-grade indolent neoplasm such as pulmonary adenocarcinoma. Continue annual CT is recommended until 5 years of stability has been established. These nodules should be considered highly suspicious if the solid component of the nodule is 6 mm or greater in size and enlarging. This recommendation follows the consensus statement: Guidelines for Management of Incidental Pulmonary Nodules Detected on CT Images: From the Fleischner Society 2017; Radiology 2017; 284:228-243. 2. No new or progressive disease identified. 3. Hiatal hernia. 4. Coronary artery calcifications. 5. Aortic atherosclerosis.  Aortic Atherosclerosis (ICD10-I70.0).   Electronically Signed   By: Kerby Moors M.D.   On: 10/24/2020 13:08 NUCLEAR MEDICINE PET  SKULL BASE TO THIGH  TECHNIQUE: 7.42 mCi F-18 FDG was injected intravenously. Full-ring PET imaging was performed from the skull base to thigh after the radiotracer. CT data was obtained and used for attenuation correction and anatomic localization.  Fasting blood glucose: 138 mg/dl  COMPARISON:  PET-CT 09/29/2018 and chest CT 10/24/2020  FINDINGS: Mediastinal blood pool activity: SUV max 2.71  Liver activity: SUV max NA  NECK: No hypermetabolic lymph nodes in the neck.  Incidental CT findings: none  CHEST: The sub solid right upper lobe pulmonary nodule measures demonstrates mild hypermetabolism with SUV max of 1.47. Given its small size this is somewhat worrisome for a low-grade adenocarcinoma.  No enlarged or hypermetabolic mediastinal or hilar lymph  nodes.  No other hypermetabolic pulmonary lesions.  No breast masses, supraclavicular or axillary adenopathy. Stable surgical changes from bilateral mastectomies and stable radiation changes involving the anterior aspect of the right lung.  Incidental CT findings: Stable vascular calcifications and moderate-sized hiatal hernia.  ABDOMEN/PELVIS: No abnormal hypermetabolic activity within the liver, pancreas, adrenal glands, or spleen. No hypermetabolic lymph nodes in the abdomen or pelvis.  Incidental CT findings: An IVC filter is noted. Scattered aortic and iliac artery calcifications.  SKELETON: No focal hypermetabolic activity to suggest skeletal metastasis.  Incidental CT findings: none  IMPRESSION: 1. Low level hypermetabolism in the partly sub solid right upper lobe pulmonary nodule is somewhat concerning for a low-grade adenocarcinoma. Options include continued close CT follow-up versus biopsy versus surgical excision. 2. No enlarged or hypermetabolic mediastinal or hilar lymph nodes. 3. No significant findings in the abdomen/pelvis or bony structures.   Electronically Signed   By: Marijo Sanes M.D.   On: 11/27/2020 11:28 I personally reviewed the CT and PET/CT images.  Findings notable for a mixed density right upper lobe nodule.  Minimally active on PET/CT.  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  She has a remote history of smoking about a pack a day for 15 years prior to quitting in 1980.  She has been followed with CT scans for her breast cancer which was treated back in 2013 and 2014.  A few years back she was noted to have a small right upper lobe lung  nodule.  This has slowly grown over time.  Is a mixed density nodule that does have a solid component.  On PET CT there is some very mild metabolic activity.  Differential diagnosis includes low-grade adenocarcinoma as well as infectious and inflammatory nodules.  This is highly suspicious for a low-grade primary bronchogenic carcinoma.  She had bronchial washing cytology that was positive for adenocarcinoma in 2014.  It is hard to reconcile that with her low-grade lesion on CT.  I suspect that is unrelated.  In any event, she understands that does not necessarily mean this nodule is cancerous.  Given that has increased in size on serial scans it does need to be evaluated and treated.  We discussed potential treatment options including stereotactic radiation and surgical resection.  I do not know if she would be a candidate for radiation therapy given her prior radiation for her breast cancer.  I did offer to refer her to radiation oncology if she wants to seriously consider that option.  We discussed the relative advantages and  disadvantages of surgery versus radiation.  I described the proposed surgical procedure to her.  We will plan to do a navigational bronchoscopy to mark the nodule followed by a robotic right VATS for right upper lobe segmentectomy or lobectomy depending on intraoperative findings.  She has adequate pulmonary function to tolerate either procedure.  I informed her of the general nature of the procedure including the need for general anesthesia, the incisions to be used, the use of a drainage tube postoperatively, the expected hospital stay, and the overall recovery.  I informed her of the indications, risks, benefits, and alternatives.  She understands the risks include, but not limited to death, MI, DVT, PE, bleeding, possible need for transfusion, infection, prolonged air leak, cardiac arrhythmias, as well as possibility of other unstable complications.  She does have a history of  DVT and PE and has a documented hypercoagulable state.  She had an IVC filter placed back in 2014.  She is on Xarelto.  She would need to hold Xarelto for 48 hours prior to surgery.  We would then plan to restart that 2 or 3 days postoperatively.  She is at increased risk for cardiac arrhythmias given her history of paroxysmal atrial tachycardia.  We can manage that medically should it occur.  She wishes to think over her options.  She may want to wait until after the current increase in coronavirus settles down before having surgery.  She will let us know when she would like to proceed.  Plan: Navigational bronchoscopy for tumor marking, robotic right VATS for right upper lobe segmentectomy or lobectomy. Patient will call to schedule.  [name], MD [facility] [phone]

## 2021-01-07 NOTE — Progress Notes (Signed)
PCP is Leighton Ruff, MD Referring Provider is Collene Gobble, MD  Chief Complaint  Patient presents with  . Lung Lesion    New patient consultation, PFTs1/26/22, PET 11/27/20, Chest CT 10/24/20    HPI: [name] is sent for consultation regarding a right upper lobe lung nodule.  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  She has a remote history of smoking about a pack a day for 15 years prior to quitting in 1980.  In 2013 she had inflammatory breast cancer.  She was treated with chemo radiation and then had mastectomy.  She had a left mastectomy later as well.  She presented with respiratory failure during chemotherapy.  She was found to have DVT and PE.  She had a filter placed and was anticoagulated.  She was intubated and apparently bronchial washings were sent for cytology.  That showed adenocarcinoma lung primary.  There was no lung nodule apparent at that time.  She completed treatment for her breast cancer.  She has been followed since then with CTs.  She was first noted to have a small subsolid nodule in the right upper lobe in 2017.  Over time it has gradually gotten larger.  There is a small solid component.  In November her CT showed an increase in size of the nodule.  On PET CT, it was mildly hypermetabolic with an SUV of 8.03.  She remains active although her exercise somewhat limited by arthritis in her knees.  She is not having any chest pain, pressure, tightness, or shortness of breath.  She uses her Symbicort inhaler once a day.  She rarely if ever uses her albuterol inhaler.  She denies any change in appetite or weight loss.  No unusual headaches or visual changes.  Zubrod Score: At the time of surgery this patient's most appropriate activity status/level should be described as: [x]     0    Normal  activity, no symptoms []     1    Restricted in physical strenuous activity but ambulatory, able to do out light work []     2    Ambulatory and capable of self care, unable to do work activities, up and about >50 % of waking hours                              []     3    Only limited self care, in bed greater than 50% of waking hours []     4    Completely disabled, no self care, confined to bed or chair []     5    Moribund  Past Medical History:  Diagnosis Date  . Allergy   . Anemia    "after chemo"  . Arthritis    "knees; fingers; occasionally" (05/10/2014)  . Asthma    Uses Inhalers Proventil as needed  . Breast cancer ([place]) 07/14/12   inflammatory right breast ca, ER/PR -  . Bursitis    "both shoulders"  . Clotting disorder ([place])    prothrombin gene mutation-heterozygous   . DVT (deep venous thrombosis) ([facility]) 2014   BLE  . Dysrhythmia    PAT-sees dr Lina Sayre meds  . GERD (gastroesophageal reflux disease)   . H/O hiatal hernia   . History of blood transfusion 01-25-13   2 units 01-11-13 ("after  chem")  . History of chemotherapy    Last dose to be 02-04-13.  Was rx'd with Herceptin & Gemzar  . Lymphedema    RUE  . Osteopenia    "lower spine only" (05/10/2014)  . PAT (paroxysmal atrial tachycardia) ([facility])    hx  . Pneumonia 01-25-13   hx. 12-27-12-hospital stay x 9 days, now resolved.  . Pneumonia    "episodic; all my life" (05/10/2014)  . PONV (postoperative nausea and vomiting) 09-13-12   severe, with Port-a-cath, was managed without PONV  . S/P radiation therapy 4-12 wks ago 03/31/13-05/17/13   right breast/supraclavicular fossa/posterior axillary boost    Past Surgical History:  Procedure Laterality Date  . BREAST BIOPSY Right 2013 X 3  . CHALAZION EXCISION Right 1980's   right( MD office)  . COLONOSCOPY    . DILATION AND CURETTAGE OF UTERUS  1998 X 3  . INSERTION OF VENA CAVA FILTER  12/2012  . MASTECTOMY Left 05/10/2014   PROPHYLACTIC   . MASTECTOMY MODIFIED RADICAL  Right 02/08/2013   Procedure: MASTECTOMY MODIFIED RADICAL;  Surgeon: Rolm Bookbinder, MD;  Location: [facility];  Service: General;  Laterality: Right;  . MASTECTOMY MODIFIED RADICAL Right   . PORT-A-CATH REMOVAL  05/10/2014  . PORT-A-CATH REMOVAL N/A 05/10/2014   Procedure: REMOVAL PORT-A-CATH;  Surgeon: Rolm Bookbinder, MD;  Location: [facility];  Service: General;  Laterality: N/A;  . PORTACATH PLACEMENT  07/21/2012   Procedure: INSERTION PORT-A-CATH;  Surgeon: Rolm Bookbinder, MD;  Location: [facility];  Service: General;  Laterality: Left;/ Replacement done 10'13  . SIMPLE MASTECTOMY WITH AXILLARY SENTINEL NODE BIOPSY Left 05/10/2014   Procedure: PROPHYLACTIC LEFT MASTECTOMY;  Surgeon: Rolm Bookbinder, MD;  Location: [facility];  Service: General;  Laterality: Left;  . TONSILLECTOMY    . TUBAL LIGATION  ~ 1974  . TUMOR EXCISION Right 1970   giant cell, off my forearm"    Family History  Problem Relation Age of Onset  . Diabetes Mother   . Heart disease Mother   . Hypertension Mother   . Hyperlipidemia Mother   . Breast cancer Mother 52       lobular breast cancer  . Heart disease Maternal Grandmother        died in her 76s from heart disease  . Lung cancer Father 26       adenocarcinoma  . Allergic rhinitis Father   . Asthma Father   . Cancer Paternal Uncle        dx in mid 1s with a cancer in the leg, died late 34s; grandpaternal half uncle  . Allergic rhinitis Sister   . Asthma Sister   . Heart disease Maternal Aunt   . Heart disease Maternal Uncle   . Allergic rhinitis Son   . Asthma Son   . Angioedema Neg Hx   . Eczema Neg Hx   . Immunodeficiency Neg Hx   . Urticaria Neg Hx     Social History Social History   Tobacco Use  . Smoking status: Former Smoker    Packs/day: 1.00    Years: 15.00    Pack years: 15.00    Types: Cigarettes    Quit date: 07/21/1979    Years since quitting: 41.4  . Smokeless tobacco: Never Used  Vaping Use  . Vaping Use: Never  used  Substance Use Topics  . Alcohol use: No  . Drug use: No    Current Outpatient Medications  Medication Sig Dispense Refill  . albuterol (  PROAIR HFA) 108 (90 Base) MCG/ACT inhaler 4 Puffs every 4-6 hours as needed for cough or wheezing.  Use with spacer. 18 g 1  . atorvastatin (LIPITOR) 10 MG tablet Jessica 10 mg by mouth daily.    . B Complex-C (SUPER B COMPLEX PO) Jessica 1 tablet by mouth at bedtime.     . calcium-vitamin D (OSCAL WITH D) 500-200 MG-UNIT per tablet Jessica 1 tablet by mouth every other day.     Marland Kitchen EPINEPHrine 0.3 mg/0.3 mL IJ SOAJ injection Use as directed for severe allergic reaction 2 each 1  . fluticasone (FLONASE) 50 MCG/ACT nasal spray Place 1 spray into both nostrils as needed for allergies or rhinitis. 16 g 5  . glucosamine-chondroitin 500-400 MG tablet Jessica 1 tablet by mouth at bedtime.     . Influenza vac split quadrivalent PF (FLUZONE HIGH-DOSE) 0.5 ML injection Bring to office 0.5 mL 0  . lisinopril (PRINIVIL,ZESTRIL) 2.5 MG tablet Jessica 2.5 mg by mouth every evening.     . metoprolol succinate (TOPROL-XL) 50 MG 24 hr tablet Jessica 1 tablet (50 mg total) by mouth daily. 90 tablet 3  . Multiple Vitamin (MULTIVITAMIN WITH MINERALS) TABS Jessica 1 tablet by mouth every other day.     Marland Kitchen omeprazole (PRILOSEC) 20 MG capsule Jessica 1 capsule (20 mg total) by mouth daily. For heartburn 30 capsule 5  . SYMBICORT 160-4.5 MCG/ACT inhaler INHALE 2 PUFFS BY MOUTH TWICE DAILY .  USE  WITH  SPACER. (Patient taking differently: Inhale 1 puff into the lungs daily. INHALE 2 PUFFS BY MOUTH TWICE DAILY .  USE  WITH  SPACER.) 10.2 g 5  . XARELTO 20 MG TABS tablet Jessica 1 TABLET BY MOUTH ONCE DAILY WITH SUPPER 90 tablet 0  . Flaxseed, Linseed, (FLAX SEEDS PO) Jessica 1 tablet by mouth daily. (Patient not taking: Reported on 01/02/2021)     No current facility-administered medications for this visit.    Allergies  Allergen Reactions  . Anesthetics, Amide Nausea And Vomiting    Projectile  vomitting-Nausea- with 24hrs of dry heaves. With any anesthetics  . Bactrim [Sulfamethoxazole-Trimethoprim] Other (See Comments)    Massive diarrhea, nausea vomiting, platelets dropped, hemoglobin dropped, admitted to hospital  . Sulfa Antibiotics Nausea And Vomiting and Other (See Comments)    Crazy feeling in head Life threatening reaction, decreased blood counts  . Codeine Nausea And Vomiting  . Codeine Phosphate Nausea And Vomiting  . Eggs Or Egg-Derived Products Hives  . Fish Allergy   . Milk-Related Compounds      Upset stomach   . Other     All tree nuts itching,sneezing    Review of Systems  Constitutional: Negative for activity change and unexpected weight change.  HENT: Negative for trouble swallowing and voice change.   Respiratory: Negative for cough, shortness of breath and wheezing.   Cardiovascular: Negative for chest pain.  Gastrointestinal: Positive for abdominal pain (Reflux).  Genitourinary: Negative for difficulty urinating and dysuria.  Musculoskeletal: Positive for arthralgias and joint swelling.  Neurological: Negative for seizures, syncope and weakness.  Hematological: Negative for adenopathy. Bruises/bleeds easily.  All other systems reviewed and are negative.   BP (!) 170/74 (BP Location: Left Arm, Patient Position: Sitting, Cuff Size: Normal)   Pulse (!) 59   Temp 97.9 F (36.6 C) (Skin)   Resp 18   Ht 5\' 1"  (1.549 m)   Wt 147 lb (66.7 kg)   LMP  (LMP Unknown)   SpO2 98% Comment: RA  BMI 27.78 kg/m  Physical Exam Vitals reviewed.  Constitutional:      General: She is not in acute distress.    Appearance: Normal appearance.  HENT:     Head: Normocephalic and atraumatic.  Eyes:     General: No scleral icterus.    Extraocular Movements: Extraocular movements intact.  Cardiovascular:     Rate and Rhythm: Normal rate and regular rhythm.     Pulses: Normal pulses.     Heart sounds: Normal heart sounds. No murmur heard. No friction rub. No  gallop.   Pulmonary:     Effort: Pulmonary effort is normal. No respiratory distress.     Breath sounds: Normal breath sounds. No wheezing.  Abdominal:     General: There is no distension.     Palpations: Abdomen is soft.  Musculoskeletal:        General: No swelling.     Cervical back: Neck supple. No rigidity.     Comments: Status post bilateral mastectomy  Lymphadenopathy:     Cervical: No cervical adenopathy.  Skin:    General: Skin is warm and dry.  Neurological:     General: No focal deficit present.     Mental Status: She is alert and oriented to person, place, and time.     Cranial Nerves: No cranial nerve deficit.     Motor: No weakness.     Gait: Gait normal.    Diagnostic Tests: CT CHEST WITH CONTRAST  TECHNIQUE: Multidetector CT imaging of the chest was performed during intravenous contrast administration.  CONTRAST:  41mL OMNIPAQUE IOHEXOL 300 MG/ML  SOLN  COMPARISON:  10/24/2019  FINDINGS: Cardiovascular: The heart size is normal. No pericardial effusion identified. Mild coronary artery calcifications.  Mediastinum/Nodes: Normal appearance of the thyroid gland. The trachea appears patent and is midline. Normal appearance of the esophagus.  Previous right axillary node dissection. No enlarged axillary, supraclavicular, mediastinal, or hilar lymph nodes.  Lungs/Pleura: No pleural effusion. Subpleural fibrotic changes within the right upper lobe anteriorly most likely reflect changes secondary to external beam radiation.  The part solid nodule within the central right upper lobe is again noted. This measures 1 cm in maximum dimension, image 59/7. The solid component measures 5 mm, image 60/7. Previously this measured 8 mm with a solid component measuring 5 mm.  3 mm solid nodule within the lateral left upper lobe is unchanged, image 65/7.  No new lung nodules.  Upper Abdomen: Moderate size hiatal hernia. No acute findings identified  within the imaged portions of the upper abdomen.  Musculoskeletal: Status post right mastectomy. No acute or suspicious osseous findings.  IMPRESSION: 1. Continued mild increase in size of part solid nodule within the central right upper lobe. The solid component of this nodule remains stable at 5 mm. Given the gradual overall increase in size of this lesion this is concerning for low-grade indolent neoplasm such as pulmonary adenocarcinoma. Continue annual CT is recommended until 5 years of stability has been established. These nodules should be considered highly suspicious if the solid component of the nodule is 6 mm or greater in size and enlarging. This recommendation follows the consensus statement: Guidelines for Management of Incidental Pulmonary Nodules Detected on CT Images: From the Fleischner Society 2017; Radiology 2017; 284:228-243. 2. No new or progressive disease identified. 3. Hiatal hernia. 4. Coronary artery calcifications. 5. Aortic atherosclerosis.  Aortic Atherosclerosis (ICD10-I70.0).   Electronically Signed   By: Kerby Moors M.D.   On: 10/24/2020 13:08 NUCLEAR MEDICINE PET  SKULL BASE TO THIGH  TECHNIQUE: 7.42 mCi F-18 FDG was injected intravenously. Full-ring PET imaging was performed from the skull base to thigh after the radiotracer. CT data was obtained and used for attenuation correction and anatomic localization.  Fasting blood glucose: 138 mg/dl  COMPARISON:  PET-CT 09/29/2018 and chest CT 10/24/2020  FINDINGS: Mediastinal blood pool activity: SUV max 2.71  Liver activity: SUV max NA  NECK: No hypermetabolic lymph nodes in the neck.  Incidental CT findings: none  CHEST: The sub solid right upper lobe pulmonary nodule measures demonstrates mild hypermetabolism with SUV max of 1.47. Given its small size this is somewhat worrisome for a low-grade adenocarcinoma.  No enlarged or hypermetabolic mediastinal or hilar lymph  nodes.  No other hypermetabolic pulmonary lesions.  No breast masses, supraclavicular or axillary adenopathy. Stable surgical changes from bilateral mastectomies and stable radiation changes involving the anterior aspect of the right lung.  Incidental CT findings: Stable vascular calcifications and moderate-sized hiatal hernia.  ABDOMEN/PELVIS: No abnormal hypermetabolic activity within the liver, pancreas, adrenal glands, or spleen. No hypermetabolic lymph nodes in the abdomen or pelvis.  Incidental CT findings: An IVC filter is noted. Scattered aortic and iliac artery calcifications.  SKELETON: No focal hypermetabolic activity to suggest skeletal metastasis.  Incidental CT findings: none  IMPRESSION: 1. Low level hypermetabolism in the partly sub solid right upper lobe pulmonary nodule is somewhat concerning for a low-grade adenocarcinoma. Options include continued close CT follow-up versus biopsy versus surgical excision. 2. No enlarged or hypermetabolic mediastinal or hilar lymph nodes. 3. No significant findings in the abdomen/pelvis or bony structures.   Electronically Signed   By: Marijo Sanes M.D.   On: 11/27/2020 11:28 I personally reviewed the CT and PET/CT images.  Findings notable for a mixed density right upper lobe nodule.  Minimally active on PET/CT.  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  She has a remote history of smoking about a pack a day for 15 years prior to quitting in 1980.  She has been followed with CT scans for her breast cancer which was treated back in 2013 and 2014.  A few years back she was noted to have a small right upper lobe lung  nodule.  This has slowly grown over time.  Is a mixed density nodule that does have a solid component.  On PET CT there is some very mild metabolic activity.  Differential diagnosis includes low-grade adenocarcinoma as well as infectious and inflammatory nodules.  This is highly suspicious for a low-grade primary bronchogenic carcinoma.  She had bronchial washing cytology that was positive for adenocarcinoma in 2014.  It is hard to reconcile that with her low-grade lesion on CT.  I suspect that is unrelated.  In any event, she understands that does not necessarily mean this nodule is cancerous.  Given that has increased in size on serial scans it does need to be evaluated and treated.  We discussed potential treatment options including stereotactic radiation and surgical resection.  I do not know if she would be a candidate for radiation therapy given her prior radiation for her breast cancer.  I did offer to refer her to radiation oncology if she wants to seriously consider that option.  We discussed the relative advantages and  disadvantages of surgery versus radiation.  I described the proposed surgical procedure to her.  We will plan to do a navigational bronchoscopy to mark the nodule followed by a robotic right VATS for right upper lobe segmentectomy or lobectomy depending on intraoperative findings.  She has adequate pulmonary function to tolerate either procedure.  I informed her of the general nature of the procedure including the need for general anesthesia, the incisions to be used, the use of a drainage tube postoperatively, the expected hospital stay, and the overall recovery.  I informed her of the indications, risks, benefits, and alternatives.  She understands the risks include, but not limited to death, MI, DVT, PE, bleeding, possible need for transfusion, infection, prolonged air leak, cardiac arrhythmias, as well as possibility of other unstable complications.  She does have a history of  DVT and PE and has a documented hypercoagulable state.  She had an IVC filter placed back in 2014.  She is on Xarelto.  She would need to hold Xarelto for 48 hours prior to surgery.  We would then plan to restart that 2 or 3 days postoperatively.  She is at increased risk for cardiac arrhythmias given her history of paroxysmal atrial tachycardia.  We can manage that medically should it occur.  She wishes to think over her options.  She may want to wait until after the current increase in coronavirus settles down before having surgery.  She will let us know when she would like to proceed.  Plan: Navigational bronchoscopy for tumor marking, robotic right VATS for right upper lobe segmentectomy or lobectomy. Patient will call to schedule.  [name], MD [facility] [phone]

## 2021-01-08 ENCOUNTER — Other Ambulatory Visit: Payer: Self-pay | Admitting: Allergy & Immunology

## 2021-01-09 ENCOUNTER — Other Ambulatory Visit: Payer: Self-pay | Admitting: *Deleted

## 2021-01-09 DIAGNOSIS — R911 Solitary pulmonary nodule: Secondary | ICD-10-CM

## 2021-01-10 ENCOUNTER — Encounter: Payer: Self-pay | Admitting: *Deleted

## 2021-01-10 ENCOUNTER — Other Ambulatory Visit: Payer: Self-pay | Admitting: Thoracic Surgery (Cardiothoracic Vascular Surgery)

## 2021-01-10 ENCOUNTER — Telehealth: Payer: Self-pay | Admitting: Allergy & Immunology

## 2021-01-10 ENCOUNTER — Telehealth: Payer: Self-pay | Admitting: Emergency Medicine

## 2021-01-10 DIAGNOSIS — R911 Solitary pulmonary nodule: Secondary | ICD-10-CM

## 2021-01-10 MED ORDER — SYMBICORT 160-4.5 MCG/ACT IN AERO
1.0000 | INHALATION_SPRAY | Freq: Every day | RESPIRATORY_TRACT | 4 refills | Status: DC
Start: 2021-01-10 — End: 2021-01-16

## 2021-01-10 NOTE — Telephone Encounter (Signed)
Patient called and made appointment for Monday feb.14 at 3;45 and needs a refill on her symbicort. [facility] on [address] [phone]

## 2021-01-10 NOTE — Telephone Encounter (Signed)
Symbicort sent in to [facility] on [address].  Patient notified

## 2021-01-10 NOTE — Telephone Encounter (Signed)
Call returned to patient, verified DOB. Original appt was March 2. Patient was referred to Dr. Roxan Hockey and her surgery appt with him is scheduled for March 2. Rescheduled appt. Patient aware to call back if she needs anything else. Nothing further needed at this time.

## 2021-01-14 ENCOUNTER — Ambulatory Visit (INDEPENDENT_AMBULATORY_CARE_PROVIDER_SITE_OTHER): Payer: Medicare Other | Admitting: Allergy & Immunology

## 2021-01-14 ENCOUNTER — Encounter: Payer: Self-pay | Admitting: Allergy & Immunology

## 2021-01-14 ENCOUNTER — Other Ambulatory Visit: Payer: Self-pay

## 2021-01-14 VITALS — BP 138/72 | HR 71 | Temp 98.8°F | Resp 16 | Ht 61.0 in | Wt 146.0 lb

## 2021-01-14 DIAGNOSIS — J302 Other seasonal allergic rhinitis: Secondary | ICD-10-CM

## 2021-01-14 DIAGNOSIS — J4540 Moderate persistent asthma, uncomplicated: Secondary | ICD-10-CM | POA: Diagnosis not present

## 2021-01-14 DIAGNOSIS — T7800XD Anaphylactic reaction due to unspecified food, subsequent encounter: Secondary | ICD-10-CM | POA: Diagnosis not present

## 2021-01-14 DIAGNOSIS — J3089 Other allergic rhinitis: Secondary | ICD-10-CM | POA: Diagnosis not present

## 2021-01-14 NOTE — Patient Instructions (Addendum)
1. Mild persistent asthma, uncomplicated - Lung testing not done today.  - Daily controller medication(s): Symbicort 160/4.5 one puff once daily (increasing to two twice daily during flares) - Prior to physical activity: ProAir 2 puffs 10-15 minutes before physical activity. - Rescue medications: ProAir 4 puffs every 4-6 hours as needed - Asthma control goals:  * Full participation in all desired activities (may need albuterol before activity) * Albuterol use two time or less a week on average (not counting use with activity) * Cough interfering with sleep two time or less a month * Oral steroids no more than once a year * No hospitalizations  2. Chronic allergic rhinitis (grasses, weeds, molds, dust mite, cat, dog, feathers, horse, cockroach) - Continue with Flonase 1 spray per nostril daily. - Continue with antihistamines daily as needed.  3. Intense pruritis - with alpha gal sensitivity - Continue to avoid red meat as you are doing.  - Continue with avoidance of your other triggering foods.  - EpiPen refilled.  - Try emu [employer] is in [place], [place]): [email or web address]  4. Return in about 1 year (around 01/14/2022).  Please inform us of any Emergency Department visits, hospitalizations, or changes in symptoms. Call us before going to the ED for breathing or allergy symptoms since we might be able to fit you in for a sick visit. Feel free to contact us anytime with any questions, problems, or concerns.  It was a pleasure to see you again today!  Websites that have reliable patient information: 1. American Academy of Asthma, Allergy, and Immunology: www.aaaai.org 2. Food Allergy Research and Education (FARE): foodallergy.org 3. Mothers of Asthmatics: http://www.asthmacommunitynetwork.org 4. American College of Allergy, Asthma, and Immunology: www.acaai.org   COVID-19 Vaccine Information can be found at:  [email or web address].[email or web address].uk For questions related to vaccine distribution or appointments, please email [email or web address] or call [phone].   We realize that you might be concerned about having an allergic reaction to the COVID19 vaccines. To help with that concern, WE ARE OFFERING THE COVID19 VACCINES IN OUR OFFICE! Ask the front desk for dates!     "Like" [place] on Facebook and Instagram for our latest updates!       Make sure you are registered to vote! If you have moved or changed any of your contact information, you will need to get this updated before voting!  In some cases, you MAY be able to register to vote online: [email or web address]

## 2021-01-14 NOTE — Progress Notes (Unsigned)
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  Her life has really been changed since we figure out the alpha gal situation.  Avoidance of mammalian meats has helped tremendously and she attributes this to her being in such a good physical condition for her upcoming surgery.  We will continue with epinephrine availability and meat avoidance.  She is now interested in retesting.  Of note, she is likely a candidate for a fourth COVID-19 vaccine as a cancer survivor.  Although her immunosuppression at this point is probably fairly innocuous, she could certainly qualify.   Plan/Recommendations:   1. Mild persistent asthma, uncomplicated - Lung testing not done today.  - Daily controller medication(s): Symbicort 160/4.5 one puff once daily (increasing to two twice daily during flares) - Prior to physical activity: ProAir 2 puffs 10-15 minutes before physical activity. - Rescue medications: ProAir 4 puffs every 4-6 hours as needed - Asthma control goals:  * Full participation in all desired activities (may need albuterol before activity) * Albuterol use two time or less a week on average (not counting use with activity) * Cough interfering with sleep two time or less a month * Oral steroids no more than once a year * No hospitalizations  2. Chronic allergic rhinitis (grasses, weeds, molds, dust mite, cat, dog, feathers, horse, cockroach) - Continue with Flonase 1 spray per nostril daily. - Continue  with antihistamines daily as needed.  3. Intense pruritis - with alpha gal sensitivity - Continue to avoid red meat as you are doing.  - Continue with avoidance of your other triggering foods.  - EpiPen refilled.  - Try emu [employer] is in [place], [place]): [email or web address]  4. Return in about 1 year (around 01/14/2022).  Subjective:   Jessica Wells is a 75 y.o. female presenting today for follow up of  Chief Complaint  Patient presents with  . Food Intolerance    Pt is still avoiding mammal meat    Jessica Wells has a history of the following: Patient Active Problem List   Diagnosis Date Noted  . Seasonal and perennial allergic rhinitis 09/17/2017  . S/P mastectomy 05/10/2014  . Cancer of central portion of right female breast ([place]) 09/23/2013  . Pulmonary nodule 01/03/2013  . Acute respiratory failure with hypoxia ([place]) 12/29/2012  . Hypokalemia 12/29/2012  . Pulmonary embolism ([place]) 12/28/2012  . Healthcare-associated pneumonia 12/27/2012  . Acute respiratory distress 12/27/2012  . Asthma   . GERD (gastroesophageal reflux disease)   . Dysrhythmia   . Allergy   . Arthritis   . Clotting disorder ([place])   . History of chemotherapy   . DVT (deep venous thrombosis) ([place])   . DVT of lower extremity, bilateral ([place]) 12/22/2012  . PSVT (paroxysmal supraventricular tachycardia) ([place]) 10/14/2012  . PONV (postoperative nausea and vomiting) 09/13/2012  . PAT (paroxysmal atrial tachycardia) ([place])     History obtained from: chart review and patient.  Jessica Wells is a 75 y.o. female presenting for a follow up visit.  He was last seen in December 2020  via telephone visit.  At that time, we did not make changes.  Continue with Symbicort 160 mcg 1 puff daily, increasing to 2 puffs twice daily during flares.  Her allergic rhinitis, would continue with Flonase and antihistamines.  Her pruritus was under excellent control with avoidance of red meat.  Since last visit, she has done quite  well from an atopic perspective.  However, she has a history of longstanding lung adenocarcinoma that was discovered via BAL in 2014.  However, subsequent scan showed no lesions.  She has been checked regularly since then because of this and it finally showed up on a chest CT in 2021.  A PET scan showed very mild hypermetabolism.  She has seen Dr. Roxan Hockey, a cardiothoracic surgeon, who will actually be performing resection of the lung lesion.  Her pulmonologist is Dr. Lamonte Sakai.  Her oncologist is Dr. Lindi Adie.    Asthma/Respiratory Symptom History: She remains on the Symbicort 1 puff once daily.  She does go up to twice daily during flares. Jessica Wells's asthma has been well controlled. She has not required rescue medication, experienced nocturnal awakenings due to lower respiratory symptoms, nor have activities of daily living been limited. She has required no Emergency Department or Urgent Care visits for her asthma. She has required zero courses of systemic steroids for asthma exacerbations since the last visit. ACT score today is 25, indicating excellent asthma symptom control.    Allergic Rhinitis Symptom History: She remains on cetirizine and the nasal spray.  She does not use them on a routine basis.  Her symptoms are well controlled, especially during the summer months.  Food Allergy Symptom History: She continues to have issues with pork and beef. She has noticed that dairy gives her the same symptoms as the alpha gal. She can eat a little bit of Greek yogurt in the morning. She thinks that this is a dose dependent reaction. She will have some eczema outbreaks.   Otherwise, there have been no changes to her past medical history, surgical history, family history, or social history.    Review of Systems  Constitutional: Negative.  Negative for chills, fever, malaise/fatigue and weight loss.  HENT: Negative.  Negative for congestion, ear discharge, ear pain, sinus pain and sore throat.   Eyes:  Negative for pain, discharge and redness.  Respiratory: Negative for cough, sputum production, shortness of breath and wheezing.   Cardiovascular: Negative.  Negative for chest pain and palpitations.  Gastrointestinal: Negative for abdominal pain, constipation, diarrhea, heartburn, nausea and vomiting.  Skin: Negative.  Negative for itching and rash.  Neurological: Negative for dizziness and headaches.  Endo/Heme/Allergies: Negative for environmental allergies. Does not bruise/bleed easily.       Objective:   Blood pressure 138/72, pulse 71, temperature 98.8 F (37.1 C), temperature source Tympanic, resp. rate 16, height _0  (1.549 m), weight 146 lb (66.2 kg), SpO2 98 %. Body mass index is 27.59 kg/m.   Physical Exam:  Physical Exam Constitutional:      Appearance: She is well-developed.     Comments: Bubbly female.  HENT:     Head: Normocephalic and atraumatic.     Right Ear: Tympanic membrane, ear canal and external ear normal.     Left Ear: Tympanic membrane, ear canal and external ear normal.     Nose: No nasal deformity, septal deviation, mucosal edema, rhinorrhea or epistaxis.     Right Turbinates: Enlarged. Not swollen or pale.     Left Turbinates: Enlarged. Not swollen or pale.  Right Sinus: No maxillary sinus tenderness or frontal sinus tenderness.     Left Sinus: No maxillary sinus tenderness or frontal sinus tenderness.     Mouth/Throat:     Mouth: Oropharynx is clear and moist. Mucous membranes are not pale and not dry.     Pharynx: Uvula midline.  Eyes:     General:        Right eye: No discharge.        Left eye: No discharge.     Extraocular Movements: EOM normal.     Conjunctiva/sclera: Conjunctivae normal.     Right eye: Right conjunctiva is not injected. No chemosis.    Left eye: Left conjunctiva is not injected. No chemosis.    Pupils: Pupils are equal, round, and reactive to light.  Cardiovascular:     Rate and Rhythm: Normal rate and regular  rhythm.     Heart sounds: Normal heart sounds.  Pulmonary:     Effort: Pulmonary effort is normal. No tachypnea, accessory muscle usage or respiratory distress.     Breath sounds: Normal breath sounds. No wheezing, rhonchi or rales.     Comments: Moving air well in all lung fields. Chest:     Chest wall: No tenderness.  Lymphadenopathy:     Cervical: No cervical adenopathy.  Skin:    General: Skin is warm.     Capillary Refill: Capillary refill takes less than 2 seconds.     Coloration: Skin is not pale.     Findings: No abrasion, erythema, petechiae or rash. Rash is not papular, urticarial or vesicular.     Comments: No eczematous or urticarial lesions noted.   Neurological:     Mental Status: She is alert.  Psychiatric:        Mood and Affect: Mood and affect normal.        Behavior: Behavior is cooperative.      Diagnostic studies: none     Salvatore Marvel, MD  [facility] [place] [facility] [place]

## 2021-01-15 ENCOUNTER — Encounter: Payer: Self-pay | Admitting: Allergy & Immunology

## 2021-01-15 MED ORDER — EPINEPHRINE 0.3 MG/0.3ML IJ SOAJ: INTRAMUSCULAR | 1 refills | Status: AC

## 2021-01-16 ENCOUNTER — Other Ambulatory Visit: Payer: Self-pay | Admitting: Allergy & Immunology

## 2021-01-16 ENCOUNTER — Other Ambulatory Visit: Payer: Self-pay | Admitting: Cardiology

## 2021-01-17 ENCOUNTER — Other Ambulatory Visit: Payer: Self-pay | Admitting: Allergy & Immunology

## 2021-01-28 ENCOUNTER — Ambulatory Visit
Admission: RE | Admit: 2021-01-28 | Discharge: 2021-01-28 | Disposition: A | Discharge status: Home / Self Care | Payer: Medicare Other | Source: Ambulatory Visit | Attending: Thoracic Surgery (Cardiothoracic Vascular Surgery) | Admitting: Thoracic Surgery (Cardiothoracic Vascular Surgery)

## 2021-01-28 ENCOUNTER — Other Ambulatory Visit: Payer: Self-pay

## 2021-01-28 ENCOUNTER — Ambulatory Visit (HOSPITAL_COMMUNITY)
Admission: RE | Admit: 2021-01-28 | Discharge: 2021-01-28 | Disposition: A | Discharge status: Home / Self Care | Payer: Medicare Other | Source: Ambulatory Visit | Attending: Thoracic Surgery (Cardiothoracic Vascular Surgery) | Admitting: Thoracic Surgery (Cardiothoracic Vascular Surgery)

## 2021-01-28 ENCOUNTER — Encounter (HOSPITAL_COMMUNITY)
Admission: RE | Admit: 2021-01-28 | Discharge: 2021-01-28 | Disposition: A | Discharge status: Home / Self Care | Payer: Medicare Other | Source: Ambulatory Visit | Attending: Thoracic Surgery (Cardiothoracic Vascular Surgery) | Admitting: Thoracic Surgery (Cardiothoracic Vascular Surgery)

## 2021-01-28 ENCOUNTER — Encounter (HOSPITAL_COMMUNITY): Payer: Self-pay

## 2021-01-28 ENCOUNTER — Other Ambulatory Visit (HOSPITAL_COMMUNITY)
Admission: RE | Admit: 2021-01-28 | Discharge: 2021-01-28 | Disposition: A | Discharge status: Home / Self Care | Payer: Medicare Other | Source: Ambulatory Visit | Attending: Thoracic Surgery (Cardiothoracic Vascular Surgery) | Admitting: Thoracic Surgery (Cardiothoracic Vascular Surgery)

## 2021-01-28 DIAGNOSIS — I471 Supraventricular tachycardia: Secondary | ICD-10-CM | POA: Diagnosis not present

## 2021-01-28 DIAGNOSIS — Z923 Personal history of irradiation: Secondary | ICD-10-CM | POA: Diagnosis not present

## 2021-01-28 DIAGNOSIS — Z20822 Contact with and (suspected) exposure to covid-19: Secondary | ICD-10-CM | POA: Diagnosis not present

## 2021-01-28 DIAGNOSIS — E119 Type 2 diabetes mellitus without complications: Secondary | ICD-10-CM | POA: Diagnosis not present

## 2021-01-28 DIAGNOSIS — Z833 Family history of diabetes mellitus: Secondary | ICD-10-CM | POA: Diagnosis not present

## 2021-01-28 DIAGNOSIS — E876 Hypokalemia: Secondary | ICD-10-CM | POA: Diagnosis not present

## 2021-01-28 DIAGNOSIS — K219 Gastro-esophageal reflux disease without esophagitis: Secondary | ICD-10-CM | POA: Diagnosis present

## 2021-01-28 DIAGNOSIS — Z01818 Encounter for other preprocedural examination: Secondary | ICD-10-CM | POA: Insufficient documentation

## 2021-01-28 DIAGNOSIS — Z853 Personal history of malignant neoplasm of breast: Secondary | ICD-10-CM | POA: Diagnosis not present

## 2021-01-28 DIAGNOSIS — Z8249 Family history of ischemic heart disease and other diseases of the circulatory system: Secondary | ICD-10-CM | POA: Diagnosis not present

## 2021-01-28 DIAGNOSIS — R911 Solitary pulmonary nodule: Secondary | ICD-10-CM

## 2021-01-28 DIAGNOSIS — I2510 Atherosclerotic heart disease of native coronary artery without angina pectoris: Secondary | ICD-10-CM | POA: Insufficient documentation

## 2021-01-28 DIAGNOSIS — Z86711 Personal history of pulmonary embolism: Secondary | ICD-10-CM | POA: Diagnosis not present

## 2021-01-28 DIAGNOSIS — M8580 Other specified disorders of bone density and structure, unspecified site: Secondary | ICD-10-CM | POA: Diagnosis not present

## 2021-01-28 DIAGNOSIS — Z87891 Personal history of nicotine dependence: Secondary | ICD-10-CM | POA: Diagnosis not present

## 2021-01-28 DIAGNOSIS — C3411 Malignant neoplasm of upper lobe, right bronchus or lung: Secondary | ICD-10-CM | POA: Diagnosis not present

## 2021-01-28 DIAGNOSIS — K449 Diaphragmatic hernia without obstruction or gangrene: Secondary | ICD-10-CM | POA: Diagnosis not present

## 2021-01-28 DIAGNOSIS — J90 Pleural effusion, not elsewhere classified: Secondary | ICD-10-CM | POA: Diagnosis not present

## 2021-01-28 DIAGNOSIS — J449 Chronic obstructive pulmonary disease, unspecified: Secondary | ICD-10-CM | POA: Diagnosis present

## 2021-01-28 DIAGNOSIS — I700 Atherosclerosis of aorta: Secondary | ICD-10-CM | POA: Insufficient documentation

## 2021-01-28 DIAGNOSIS — I10 Essential (primary) hypertension: Secondary | ICD-10-CM | POA: Diagnosis present

## 2021-01-28 DIAGNOSIS — Z9221 Personal history of antineoplastic chemotherapy: Secondary | ICD-10-CM | POA: Diagnosis not present

## 2021-01-28 DIAGNOSIS — E7800 Pure hypercholesterolemia, unspecified: Secondary | ICD-10-CM | POA: Diagnosis not present

## 2021-01-28 DIAGNOSIS — Z9013 Acquired absence of bilateral breasts and nipples: Secondary | ICD-10-CM | POA: Diagnosis not present

## 2021-01-28 DIAGNOSIS — Z7901 Long term (current) use of anticoagulants: Secondary | ICD-10-CM | POA: Diagnosis not present

## 2021-01-28 DIAGNOSIS — E1169 Type 2 diabetes mellitus with other specified complication: Secondary | ICD-10-CM | POA: Diagnosis not present

## 2021-01-28 DIAGNOSIS — Z95828 Presence of other vascular implants and grafts: Secondary | ICD-10-CM | POA: Diagnosis not present

## 2021-01-28 DIAGNOSIS — J9601 Acute respiratory failure with hypoxia: Secondary | ICD-10-CM | POA: Diagnosis not present

## 2021-01-28 DIAGNOSIS — M47814 Spondylosis without myelopathy or radiculopathy, thoracic region: Secondary | ICD-10-CM | POA: Diagnosis not present

## 2021-01-28 DIAGNOSIS — J939 Pneumothorax, unspecified: Secondary | ICD-10-CM | POA: Diagnosis not present

## 2021-01-28 DIAGNOSIS — J9382 Other air leak: Secondary | ICD-10-CM | POA: Diagnosis not present

## 2021-01-28 DIAGNOSIS — J9811 Atelectasis: Secondary | ICD-10-CM | POA: Diagnosis not present

## 2021-01-28 DIAGNOSIS — J4540 Moderate persistent asthma, uncomplicated: Secondary | ICD-10-CM | POA: Diagnosis not present

## 2021-01-28 DIAGNOSIS — Z86718 Personal history of other venous thrombosis and embolism: Secondary | ICD-10-CM | POA: Diagnosis not present

## 2021-01-28 DIAGNOSIS — C3491 Malignant neoplasm of unspecified part of right bronchus or lung: Secondary | ICD-10-CM | POA: Diagnosis not present

## 2021-01-28 DIAGNOSIS — R001 Bradycardia, unspecified: Secondary | ICD-10-CM | POA: Diagnosis present

## 2021-01-28 DIAGNOSIS — Z9889 Other specified postprocedural states: Secondary | ICD-10-CM | POA: Diagnosis not present

## 2021-01-28 DIAGNOSIS — Z7951 Long term (current) use of inhaled steroids: Secondary | ICD-10-CM | POA: Diagnosis not present

## 2021-01-28 DIAGNOSIS — D62 Acute posthemorrhagic anemia: Secondary | ICD-10-CM | POA: Diagnosis not present

## 2021-01-28 DIAGNOSIS — Z79899 Other long term (current) drug therapy: Secondary | ICD-10-CM | POA: Diagnosis not present

## 2021-01-28 DIAGNOSIS — D6852 Prothrombin gene mutation: Secondary | ICD-10-CM | POA: Diagnosis not present

## 2021-01-28 HISTORY — DX: Other adverse food reactions, not elsewhere classified, initial encounter: T78.1XXA

## 2021-01-28 LAB — COMPREHENSIVE METABOLIC PANEL
ALT: 24 U/L (ref 0–44)
AST: 21 U/L (ref 15–41)
Albumin: 3.8 g/dL (ref 3.5–5.0)
Alkaline Phosphatase: 49 U/L (ref 38–126)
Anion gap: 10 (ref 5–15)
BUN: 23 mg/dL (ref 8–23)
CO2: 23 mmol/L (ref 22–32)
Calcium: 9.4 mg/dL (ref 8.9–10.3)
Chloride: 104 mmol/L (ref 98–111)
Creatinine, Ser: 0.82 mg/dL (ref 0.44–1.00)
GFR, Estimated: >60 mL/min (ref >60)
Glucose, Bld: 169 mg/dL — ABNORMAL HIGH (ref 70–99)
Potassium: 3.8 mmol/L (ref 3.5–5.1)
Sodium: 137 mmol/L (ref 135–145)
Total Bilirubin: 0.7 mg/dL (ref 0.3–1.2)
Total Protein: 6.4 g/dL — ABNORMAL LOW (ref 6.5–8.1)

## 2021-01-28 LAB — URINALYSIS, ROUTINE W REFLEX MICROSCOPIC
Bilirubin Urine: NEGATIVE
Glucose, UA: NEGATIVE mg/dL
Hgb urine dipstick: NEGATIVE
Ketones, ur: NEGATIVE mg/dL
Leukocytes,Ua: NEGATIVE
Nitrite: NEGATIVE
Protein, ur: NEGATIVE mg/dL
Specific Gravity, Urine: 1.013 (ref 1.005–1.030)
pH: 5 (ref 5.0–8.0)

## 2021-01-28 LAB — SURGICAL PCR SCREEN
MRSA, PCR: POSITIVE — AB
Staphylococcus aureus: POSITIVE — AB

## 2021-01-28 LAB — CBC
HCT: 37.3 % (ref 36.0–46.0)
Hemoglobin: 12.9 g/dL (ref 12.0–15.0)
MCH: 31.8 pg (ref 26.0–34.0)
MCHC: 34.6 g/dL (ref 30.0–36.0)
MCV: 91.9 fL (ref 80.0–100.0)
Platelets: 166 10*3/uL (ref 150–400)
RBC: 4.06 MIL/uL (ref 3.87–5.11)
RDW: 11.9 % (ref 11.5–15.5)
WBC: 5.7 10*3/uL (ref 4.0–10.5)
nRBC: 0 % (ref 0.0–0.2)

## 2021-01-28 LAB — APTT: aPTT: 27 seconds (ref 24–36)

## 2021-01-28 LAB — PROTIME-INR
INR: 1 (ref 0.8–1.2)
Prothrombin Time: 12.5 seconds (ref 11.4–15.2)

## 2021-01-28 LAB — GLUCOSE, CAPILLARY: Glucose-Capillary: 171 mg/dL — ABNORMAL HIGH (ref 70–99)

## 2021-01-28 NOTE — Progress Notes (Signed)
Surgical Instructions    Your procedure is scheduled on 01/30/21.  Report to [facility] Main Entrance "A" at 6:30 A.M., then check in with the Admitting office.  Call this number if you have problems the morning of surgery:  [phone]   If you have any questions prior to your surgery date call [phone]: Open Monday-Friday 8am-4pm    Remember:  Do not eat  Or drink after midnight the night before your surgery    Take these medicines the morning of surgery with A SIP OF WATER: atorvastatin (LIPITOR) metoprolol succinate (TOPROL-XL) omeprazole (PRILOSEC) SYMBICORT  As Needed: albuterol (VENTOLIN HFA) EPINEPHrine fluticasone (FLONASE)  Follow your surgeon's instructions on when to stop Xarelto.  If no instructions were given by your surgeon then you will need to call the office to get those instructions.    As of today, STOP taking any Aspirin (unless otherwise instructed by your surgeon) Aleve, Naproxen, Ibuprofen, Motrin, Advil, Goody's, BC's, all herbal medications, fish oil, and all vitamins.                     Do not wear jewelry, make up, or nail polish            Do not wear lotions, powders, perfumes or deodorant.            Do not shave 48 hours prior to surgery.              Do not bring valuables to the hospital.            [facility] is not responsible for any belongings or valuables.  Do NOT Smoke (Tobacco/Vaping) or drink Alcohol 24 hours prior to your procedure If you use a CPAP at night, you may bring all equipment for your overnight stay.   Contacts, glasses, dentures or bridgework may not be worn into surgery, please bring cases for these belongings   For patients admitted to the hospital, discharge time will be determined by your treatment team.   Patients discharged the day of surgery will not be allowed to drive home, and someone needs to stay with them for 24 hours.    Special instructions:   [place]- Preparing For Surgery  Before  surgery, you can play an important role. Because skin is not sterile, your skin needs to be as free of germs as possible. You can reduce the number of germs on your skin by washing with CHG (chlorahexidine gluconate) Soap before surgery.  CHG is an antiseptic cleaner which kills germs and bonds with the skin to continue killing germs even after washing.    Oral Hygiene is also important to reduce your risk of infection.  Remember - BRUSH YOUR TEETH THE MORNING OF SURGERY WITH YOUR REGULAR TOOTHPASTE  Please do not use if you have an allergy to CHG or antibacterial soaps. If your skin becomes reddened/irritated stop using the CHG.  Do not shave (including legs and underarms) for at least 48 hours prior to first CHG shower. It is OK to shave your face.  Please follow these instructions carefully.   1. Shower the NIGHT BEFORE SURGERY and the MORNING OF SURGERY  2. If you chose to wash your hair, wash your hair first as usual with your normal shampoo.  3. After you shampoo, rinse your hair and body thoroughly to remove the shampoo.  4. Wash Face and genitals (private parts) with your normal soap.   5.  Shower the [employer] BEFORE SURGERY  and the MORNING OF SURGERY with CHG Soap.   6. Use CHG Soap as you would any other liquid soap. You can apply CHG directly to the skin and wash gently with a scrungie or a clean washcloth.   7. Apply the CHG Soap to your body ONLY FROM THE NECK DOWN.  Do not use on open wounds or open sores. Avoid contact with your eyes, ears, mouth and genitals (private parts). Wash Face and genitals (private parts)  with your normal soap.   8. Wash thoroughly, paying special attention to the area where your surgery will be performed.  9. Thoroughly rinse your body with warm water from the neck down.  10. DO NOT shower/wash with your normal soap after using and rinsing off the CHG Soap.  11. Pat yourself dry with a CLEAN TOWEL.  12. Wear CLEAN PAJAMAS to bed the night before  surgery  13. Place CLEAN SHEETS on your bed the night before your surgery  14. DO NOT SLEEP WITH PETS.   Day of Surgery: Wear Clean/Comfortable clothing the morning of surgery Do not apply any deodorants/lotions.   Remember to brush your teeth WITH YOUR REGULAR TOOTHPASTE.   Please read over the following fact sheets that you were given.

## 2021-01-28 NOTE — Progress Notes (Addendum)
PCP:  Leighton Ruff, MD Cardiologist: Candee Furbish, MD  EKG: 01/28/21 CXR: 01/28/21 ECHO: 09/21/13 Stress Test: denies Cardiac Cath: denies  Fasting Blood Sugar- Does not check Checks Blood Sugar_0__ times a day  OSA/CPAP:  No  ASA: No Blood Thinner:  Xarelto last dose 01/26/21  Covid test 01/28/21  Anesthesia Review:  Yes, "irregular heartbeat".  Per [facility] note hx of proximal atrial tachycardia. Patient states that is why she has a cardiologist.  Xarelto is for DVT per patient.  Patient denies shortness of breath, fever, cough, and chest pain at PAT appointment.  Patient verbalized understanding of instructions provided today at the PAT appointment.  Patient asked to review instructions at home and day of surgery.

## 2021-01-29 ENCOUNTER — Encounter (HOSPITAL_COMMUNITY): Payer: Self-pay

## 2021-01-29 LAB — HEMOGLOBIN A1C
Hgb A1c MFr Bld: 7.2 % — ABNORMAL HIGH (ref 4.8–5.6)
Mean Plasma Glucose: 159.94 mg/dL

## 2021-01-29 LAB — SARS CORONAVIRUS 2 (TAT 6-24 HRS): SARS Coronavirus 2: NEGATIVE

## 2021-01-29 NOTE — Anesthesia Preprocedure Evaluation (Addendum)
Anesthesia Evaluation  Patient identified by MRN, date of birth, ID band Patient awake    Reviewed: Allergy & Precautions, NPO status , Patient's Chart, lab work & pertinent test results  History of Anesthesia Complications (+) PONV  Airway Mallampati: II  TM Distance: >3 FB     Dental  (+) Teeth Intact   Pulmonary asthma , COPD,  COPD inhaler, former smoker, PE (2014) RUL nodule   Pulmonary exam normal        Cardiovascular hypertension, Pt. on home beta blockers + DVT (IVC filter)   Rhythm:Regular Rate:Normal     Neuro/Psych negative neurological ROS  negative psych ROS   GI/Hepatic Neg liver ROS, hiatal hernia, GERD  Medicated,  Endo/Other  negative endocrine ROS  Renal/GU negative Renal ROS  negative genitourinary   Musculoskeletal  (+) Arthritis , Osteoarthritis,  Breast cancer   Abdominal (+)  Abdomen: soft. Bowel sounds: normal.  Peds  Hematology  (+) anemia ,   Anesthesia Other Findings   Reproductive/Obstetrics                           Anesthesia Physical Anesthesia Plan  ASA: III  Anesthesia Plan: General   Post-op Pain Management:    Induction: Intravenous  PONV Risk Score and Plan: 4 or greater and Ondansetron, Dexamethasone, Midazolam, Treatment may vary due to age or medical condition and Scopolamine patch - Pre-op  Airway Management Planned: Mask and Double Lumen EBT  Additional Equipment: Arterial line  Intra-op Plan:   Post-operative Plan: Extubation in OR  Informed Consent: I have reviewed the patients History and Physical, chart, labs and discussed the procedure including the risks, benefits and alternatives for the proposed anesthesia with the patient or authorized representative who has indicated his/her understanding and acceptance.     Dental advisory given  Plan Discussed with: CRNA  Anesthesia Plan Comments: (See PAT note written 01/29/2021  by Myra Gianotti, PA-C.  Lab Results      Component                Value               Date                      WBC                      5.7                 01/28/2021                HGB                      12.9                01/28/2021                HCT                      37.3                01/28/2021                MCV                      91.9                01/28/2021  PLT                      166                 01/28/2021           Lab Results      Component                Value               Date                      NA                       137                 01/28/2021                K                        3.8                 01/28/2021                CO2                      23                  01/28/2021                GLUCOSE                  169 (H)             01/28/2021                BUN                      23                  01/28/2021                CREATININE               0.82                01/28/2021                CALCIUM                  9.4                 01/28/2021                GFRNONAA                 >60                 01/28/2021                GFRAA                    >60                 09/15/2018          )      Anesthesia Quick Evaluation

## 2021-01-29 NOTE — Progress Notes (Addendum)
Anesthesia Chart Review:  Case: [id number] Date/Time: 01/30/21 0815   Procedures:      XI ROBOTIC ASSISTED THORACOSCOPY-RIGHT UPPER LOBE SEGMENTECTOMY OR LOBECTOMY (Right Chest)     VIDEO BRONCHOSCOPY WITH ENDOBRONCHIAL NAVIGATION FOR TUMOR MARKING (N/A )   Anesthesia type: General   Pre-op diagnosis: RUL NODULE   Location: [facility] OR ROOM 10 / [facility]   Surgeons: [name], MD      DISCUSSION: Patient is a 75 year old female scheduled for the above procedure.   History includes former smoker (quit 07/21/19), post-operative N/V, paroxysmal atral tachycardia, DVT (BLE DVT & superficial thrombus left GSV 12/22/12; work-up revealed + Prothrombin gene mutation, heterozygous, s/p IVC filter insertion 12/27/12), PE (bilteral, no CT evidence of right heart strain 12/27/12; had bronchoscopy/LUL bronchial lavage 12/30/12, cytology consistent concerning for adenocarcinoma, 02/28/13 PET/CT negative, underwent chemoradiation for breast cancer), asthma, breast cancer (right inflammatory breast cancer, s/p right modified radical mastectomy 02/08/13, left prophylactic total mastectomy 05/10/14; s/p chemoradiation, left Naplate PowerPort 07/21/12-05/10/14), lymphedema (RUE), GERD, hiatal hernia, alpha-gal allergy (+ alpha-gal panel 12/24/17).   Diabetes is note listed in her history, but she has had at least intermittently elevated A1c in the past, some in the setting of steroids. Last A1c noted was 7.3% 09/20/20 with [facility]. Will attempt to add A1c to PAT labs and if unable to to add then defer to surgeon if A1c desired during hospitalization. Order entered for CBG on the morning of surgery. She is not on any DM medications. (UPDATE: 01/30/21 9:24 AM: A1c resulted as 7.2% which is consistent with her last result at [facility]. I routed lab result to her PCP Leighton Ruff, MD.)  Dr. Roxan Hockey classified her Zubrod Score as 0 (normal activities, no symptoms). Last visit with Dr. Marlou Porch 11/07/20 with one year  follow-up recommended.   Last Xarelto 01/26/21.   01/28/21 presurgical COVID-19 test negative. Anesthesia team to evaluate on the day of surgery.    VS: BP (!) 154/66   Pulse 61   Temp 36.8 C (Oral)   Resp 17   Ht 5\' 1"  (1.549 m)   Wt 65.5 kg   LMP  (LMP Unknown)   SpO2 99%   BMI 27.30 kg/m     PROVIDERS: Leighton Ruff, MD is PCP - Candee Furbish, MD is cardiologist. Last visit 11/07/20 for PSVT/AT follow-up. Doing well on b-blocker. If symptoms become uncontrolled then would refer to EP for ablation consideration. On chronic anticoagulation for DVT/PE and hypercoagulable state. One year follow-up planned.  Baltazar Apo, MD is pulmonologist - Nicholas Lose, MD is HEM-ONC Salvatore Marvel, MD is allergist. Last visit 01/14/21 for asthma, allergic rhinitis, alpha-gal sensitivity with pruritis follow-up. Continue Symbicort, ProAir, Flonase, and avoidance of red meat. Epipen refilled.     LABS: Labs reviewed: Acceptable for surgery. A1c 7.3% on 10/24/20 ([place]). See DISCUSSION. (all labs ordered are listed, but only abnormal results are displayed)  Labs Reviewed  SURGICAL PCR SCREEN - Abnormal; Notable for the following components:      Result Value   MRSA, PCR POSITIVE (*)    Staphylococcus aureus POSITIVE (*)    All other components within normal limits  GLUCOSE, CAPILLARY - Abnormal; Notable for the following components:   Glucose-Capillary 171 (*)    All other components within normal limits  COMPREHENSIVE METABOLIC PANEL - Abnormal; Notable for the following components:   Glucose, Bld 169 (*)    Total Protein 6.4 (*)    All other  components within normal limits  URINALYSIS, ROUTINE W REFLEX MICROSCOPIC - Abnormal; Notable for the following components:   APPearance HAZY (*)    All other components within normal limits  APTT  CBC  PROTIME-INR  TYPE AND SCREEN    PFTs 12/26/20: FVC 2.10 (85%) FEV1 1.63 (88%) FEV1 1.78 (97%)  postbronchodilator DLCO 22.10 (128%)   IMAGES: CT Super D Chest 01/28/21: IMPRESSION: 1. No acute cardiopulmonary abnormalities. 2. Right upper lobe part-solid nodule measures 1 cm in longest dimension and has a solid component measures 4-5 mm. As mentioned previously findings are suspicious for low-grade pulmonary adenocarcinoma. 3. Additional solid nodules within the left lung are unchanged from previous exam. 4. Coronary artery calcification. 5. Small hiatal hernia. 6. Aortic atherosclerosis.  CXR 01/28/21: FINDINGS: - Small right upper lobe nodule not identified on chest x-ray. - Heart size and vascularity normal. Lungs are clear without infiltrate or effusion. - Surgical clips in the right axilla. Right mastectomy. Right apical pleural thickening consistent with scarring. IMPRESSION: No active cardiopulmonary disease.  PET Scan 11/27/20: IMPRESSION: 1. Low level hypermetabolism in the partly sub solid right upper lobe pulmonary nodule is somewhat concerning for a low-grade adenocarcinoma. Options include continued close CT follow-up versus biopsy versus surgical excision. 2. No enlarged or hypermetabolic mediastinal or hilar lymph nodes. 3. No significant findings in the abdomen/pelvis or bony structures.   EKG: 01/28/21: SB at 58 bpm   CV: Echo 09/21/13: Study Conclusions  - Left ventricle: The cavity size was normal. Wall thickness  was normal. The estimated ejection fraction was 60%. Wall  motion was normal; there were no regional wall motion  abnormalities.  - Right ventricle: The cavity size was normal. Systolic  function was normal.   Cardiac event monitor 10/24/12-11/06/12:  Summary: Sinus rhythm with occasional runs of PSVT (~ 150 bpm), Suspect A-flutter. Refer to EP. [Managed on b-blocker and would consider referral to EP if symptoms became uncontrolled]   Past Medical History:  Diagnosis Date  . Allergic reaction to alpha-gal   .  Allergy   . Anemia    "after chemo"  . Arthritis    "knees; fingers; occasionally" (05/10/2014)  . Asthma    Uses Inhalers Proventil as needed  . Breast cancer ([place]) 07/14/12   inflammatory right breast ca, ER/PR -  . Bursitis    "both shoulders"  . Clotting disorder ([place])    prothrombin gene mutation-heterozygous   . DVT (deep venous thrombosis) ([place]) 12/22/2012   BLE  . Dysrhythmia    PAT-sees dr Lina Sayre meds  . GERD (gastroesophageal reflux disease)   . H/O hiatal hernia   . History of blood transfusion 01-25-13   2 units 01-11-13 ("after chem")  . History of chemotherapy    Last dose to be 02-04-13.  Was rx'd with Herceptin & Gemzar  . Lymphedema    RUE  . Osteopenia    "lower spine only" (05/10/2014)  . PAT (paroxysmal atrial tachycardia) ([facility])    hx  . PE (pulmonary thromboembolism) ([place]) 12/27/2012  . Pneumonia 01-25-13   hx. 12-27-12-hospital stay x 9 days, now resolved.  . Pneumonia    "episodic; all my life" (05/10/2014)  . PONV (postoperative nausea and vomiting) 09-13-12   severe, with Port-a-cath, was managed without PONV  . S/P radiation therapy 4-12 wks ago 03/31/13-05/17/13   right breast/supraclavicular fossa/posterior axillary boost    Past Surgical History:  Procedure Laterality Date  . BREAST BIOPSY Right 2013 X 3  . CHALAZION EXCISION Right 1980's  right( MD office)  . COLONOSCOPY    . DILATION AND CURETTAGE OF UTERUS  1998 X 3  . INSERTION OF VENA CAVA FILTER  12/2012  . MASTECTOMY Left 05/10/2014   PROPHYLACTIC   . MASTECTOMY MODIFIED RADICAL Right 02/08/2013   Procedure: MASTECTOMY MODIFIED RADICAL;  Surgeon: Rolm Bookbinder, MD;  Location: [facility];  Service: General;  Laterality: Right;  . MASTECTOMY MODIFIED RADICAL Right   . PORT-A-CATH REMOVAL  05/10/2014  . PORT-A-CATH REMOVAL N/A 05/10/2014   Procedure: REMOVAL PORT-A-CATH;  Surgeon: Rolm Bookbinder, MD;  Location: [place];  Service: General;  Laterality: N/A;  . PORTACATH PLACEMENT  07/21/2012    Procedure: INSERTION PORT-A-CATH;  Surgeon: Rolm Bookbinder, MD;  Location: [place];  Service: General;  Laterality: Left;/ Replacement done 10'13  . SIMPLE MASTECTOMY WITH AXILLARY SENTINEL NODE BIOPSY Left 05/10/2014   Procedure: PROPHYLACTIC LEFT MASTECTOMY;  Surgeon: Rolm Bookbinder, MD;  Location: [place];  Service: General;  Laterality: Left;  . TONSILLECTOMY    . TUBAL LIGATION  ~ 1974  . 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 albuterol (VENTOLIN HFA) 108 (90 Base) MCG/ACT inhaler  . atorvastatin (LIPITOR) 10 MG tablet  . B Complex-C (SUPER B COMPLEX PO)  . calcium-vitamin D (OSCAL WITH D) 500-200 MG-UNIT per tablet  . EPINEPHrine 0.3 mg/0.3 mL IJ SOAJ injection  . fluticasone (FLONASE) 50 MCG/ACT nasal spray  . glucosamine-chondroitin 500-400 MG tablet  . lisinopril (PRINIVIL,ZESTRIL) 2.5 MG tablet  . Magnesium 250 MG TABS  . metoprolol succinate (TOPROL-XL) 50 MG 24 hr tablet  . Multiple Vitamin (MULTIVITAMIN WITH MINERALS) TABS  . omeprazole (PRILOSEC) 20 MG capsule  . SYMBICORT 160-4.5 MCG/ACT inhaler  . XARELTO 20 MG TABS tablet   No current facility-administered medications for this encounter.    Myra Gianotti, PA-C Surgical Short Stay/Anesthesiology [facility] Phone [phone] [facility] Phone [phone] 01/29/2021 3:56 PM

## 2021-01-30 ENCOUNTER — Encounter (HOSPITAL_COMMUNITY): Payer: Self-pay | Admitting: Thoracic Surgery (Cardiothoracic Vascular Surgery)

## 2021-01-30 ENCOUNTER — Inpatient Hospital Stay (HOSPITAL_COMMUNITY)
Admission: RE | Admit: 2021-01-30 | Discharge: 2021-02-02 | DRG: 164 | Disposition: A | Discharge status: Home / Self Care | Payer: Medicare Other | Attending: Thoracic Surgery (Cardiothoracic Vascular Surgery) | Admitting: Thoracic Surgery (Cardiothoracic Vascular Surgery)

## 2021-01-30 ENCOUNTER — Inpatient Hospital Stay (HOSPITAL_COMMUNITY): Payer: Medicare Other | Admitting: Vascular Surgery

## 2021-01-30 ENCOUNTER — Other Ambulatory Visit: Payer: Self-pay

## 2021-01-30 ENCOUNTER — Encounter (HOSPITAL_COMMUNITY)
Admission: RE | Disposition: A | Discharge status: Home / Self Care | Payer: Self-pay | Source: Home / Self Care | Attending: Thoracic Surgery (Cardiothoracic Vascular Surgery)

## 2021-01-30 ENCOUNTER — Ambulatory Visit: Payer: Medicare Other | Admitting: Emergency Medicine

## 2021-01-30 ENCOUNTER — Inpatient Hospital Stay (HOSPITAL_COMMUNITY): Payer: Medicare Other

## 2021-01-30 DIAGNOSIS — J9811 Atelectasis: Secondary | ICD-10-CM | POA: Diagnosis not present

## 2021-01-30 DIAGNOSIS — D62 Acute posthemorrhagic anemia: Secondary | ICD-10-CM | POA: Diagnosis not present

## 2021-01-30 DIAGNOSIS — Z20822 Contact with and (suspected) exposure to covid-19: Secondary | ICD-10-CM | POA: Diagnosis present

## 2021-01-30 DIAGNOSIS — Z79899 Other long term (current) drug therapy: Secondary | ICD-10-CM | POA: Diagnosis not present

## 2021-01-30 DIAGNOSIS — Z95828 Presence of other vascular implants and grafts: Secondary | ICD-10-CM

## 2021-01-30 DIAGNOSIS — M8580 Other specified disorders of bone density and structure, unspecified site: Secondary | ICD-10-CM | POA: Diagnosis present

## 2021-01-30 DIAGNOSIS — Z8249 Family history of ischemic heart disease and other diseases of the circulatory system: Secondary | ICD-10-CM | POA: Diagnosis not present

## 2021-01-30 DIAGNOSIS — Z7951 Long term (current) use of inhaled steroids: Secondary | ICD-10-CM | POA: Diagnosis not present

## 2021-01-30 DIAGNOSIS — J9382 Other air leak: Secondary | ICD-10-CM | POA: Diagnosis not present

## 2021-01-30 DIAGNOSIS — J449 Chronic obstructive pulmonary disease, unspecified: Secondary | ICD-10-CM | POA: Diagnosis present

## 2021-01-30 DIAGNOSIS — C50111 Malignant neoplasm of central portion of right female breast: Secondary | ICD-10-CM

## 2021-01-30 DIAGNOSIS — Z9221 Personal history of antineoplastic chemotherapy: Secondary | ICD-10-CM | POA: Diagnosis not present

## 2021-01-30 DIAGNOSIS — Z8349 Family history of other endocrine, nutritional and metabolic diseases: Secondary | ICD-10-CM

## 2021-01-30 DIAGNOSIS — J9601 Acute respiratory failure with hypoxia: Secondary | ICD-10-CM | POA: Diagnosis not present

## 2021-01-30 DIAGNOSIS — D6852 Prothrombin gene mutation: Secondary | ICD-10-CM | POA: Diagnosis present

## 2021-01-30 DIAGNOSIS — I10 Essential (primary) hypertension: Secondary | ICD-10-CM | POA: Diagnosis present

## 2021-01-30 DIAGNOSIS — R001 Bradycardia, unspecified: Secondary | ICD-10-CM | POA: Diagnosis present

## 2021-01-30 DIAGNOSIS — C3411 Malignant neoplasm of upper lobe, right bronchus or lung: Secondary | ICD-10-CM | POA: Diagnosis present

## 2021-01-30 DIAGNOSIS — J939 Pneumothorax, unspecified: Secondary | ICD-10-CM | POA: Diagnosis not present

## 2021-01-30 DIAGNOSIS — R918 Other nonspecific abnormal finding of lung field: Secondary | ICD-10-CM | POA: Diagnosis present

## 2021-01-30 DIAGNOSIS — Z86711 Personal history of pulmonary embolism: Secondary | ICD-10-CM

## 2021-01-30 DIAGNOSIS — Z801 Family history of malignant neoplasm of trachea, bronchus and lung: Secondary | ICD-10-CM

## 2021-01-30 DIAGNOSIS — C3491 Malignant neoplasm of unspecified part of right bronchus or lung: Secondary | ICD-10-CM

## 2021-01-30 DIAGNOSIS — Z853 Personal history of malignant neoplasm of breast: Secondary | ICD-10-CM

## 2021-01-30 DIAGNOSIS — Z7901 Long term (current) use of anticoagulants: Secondary | ICD-10-CM

## 2021-01-30 DIAGNOSIS — Z888 Allergy status to other drugs, medicaments and biological substances status: Secondary | ICD-10-CM

## 2021-01-30 DIAGNOSIS — K219 Gastro-esophageal reflux disease without esophagitis: Secondary | ICD-10-CM | POA: Diagnosis present

## 2021-01-30 DIAGNOSIS — Z923 Personal history of irradiation: Secondary | ICD-10-CM | POA: Diagnosis not present

## 2021-01-30 DIAGNOSIS — Z9889 Other specified postprocedural states: Secondary | ICD-10-CM | POA: Diagnosis not present

## 2021-01-30 DIAGNOSIS — Z86718 Personal history of other venous thrombosis and embolism: Secondary | ICD-10-CM | POA: Diagnosis not present

## 2021-01-30 DIAGNOSIS — Z87891 Personal history of nicotine dependence: Secondary | ICD-10-CM | POA: Diagnosis not present

## 2021-01-30 DIAGNOSIS — E7800 Pure hypercholesterolemia, unspecified: Secondary | ICD-10-CM | POA: Diagnosis not present

## 2021-01-30 DIAGNOSIS — Z833 Family history of diabetes mellitus: Secondary | ICD-10-CM | POA: Diagnosis not present

## 2021-01-30 DIAGNOSIS — I471 Supraventricular tachycardia: Secondary | ICD-10-CM | POA: Diagnosis not present

## 2021-01-30 DIAGNOSIS — Z882 Allergy status to sulfonamides status: Secondary | ICD-10-CM

## 2021-01-30 DIAGNOSIS — Z9013 Acquired absence of bilateral breasts and nipples: Secondary | ICD-10-CM | POA: Diagnosis not present

## 2021-01-30 DIAGNOSIS — Z91012 Allergy to eggs: Secondary | ICD-10-CM

## 2021-01-30 DIAGNOSIS — E876 Hypokalemia: Secondary | ICD-10-CM | POA: Diagnosis not present

## 2021-01-30 DIAGNOSIS — M170 Bilateral primary osteoarthritis of knee: Secondary | ICD-10-CM | POA: Diagnosis present

## 2021-01-30 DIAGNOSIS — R911 Solitary pulmonary nodule: Secondary | ICD-10-CM

## 2021-01-30 DIAGNOSIS — Z885 Allergy status to narcotic agent status: Secondary | ICD-10-CM

## 2021-01-30 DIAGNOSIS — Z09 Encounter for follow-up examination after completed treatment for conditions other than malignant neoplasm: Secondary | ICD-10-CM

## 2021-01-30 DIAGNOSIS — Z825 Family history of asthma and other chronic lower respiratory diseases: Secondary | ICD-10-CM

## 2021-01-30 DIAGNOSIS — Z4682 Encounter for fitting and adjustment of non-vascular catheter: Secondary | ICD-10-CM

## 2021-01-30 DIAGNOSIS — J45909 Unspecified asthma, uncomplicated: Secondary | ICD-10-CM | POA: Diagnosis present

## 2021-01-30 DIAGNOSIS — Z803 Family history of malignant neoplasm of breast: Secondary | ICD-10-CM

## 2021-01-30 DIAGNOSIS — J4540 Moderate persistent asthma, uncomplicated: Secondary | ICD-10-CM | POA: Diagnosis not present

## 2021-01-30 DIAGNOSIS — Z419 Encounter for procedure for purposes other than remedying health state, unspecified: Secondary | ICD-10-CM

## 2021-01-30 DIAGNOSIS — Z902 Acquired absence of lung [part of]: Secondary | ICD-10-CM

## 2021-01-30 DIAGNOSIS — E119 Type 2 diabetes mellitus without complications: Secondary | ICD-10-CM | POA: Diagnosis not present

## 2021-01-30 DIAGNOSIS — Z83438 Family history of other disorder of lipoprotein metabolism and other lipidemia: Secondary | ICD-10-CM

## 2021-01-30 DIAGNOSIS — E1169 Type 2 diabetes mellitus with other specified complication: Secondary | ICD-10-CM | POA: Diagnosis not present

## 2021-01-30 DIAGNOSIS — J90 Pleural effusion, not elsewhere classified: Secondary | ICD-10-CM | POA: Diagnosis not present

## 2021-01-30 HISTORY — PX: INTERCOSTAL NERVE BLOCK: SHX5021

## 2021-01-30 HISTORY — PX: XI ROBOTIC ASSISTED THORACOSCOPY- SEGMENTECTOMY: SHX6881

## 2021-01-30 HISTORY — PX: VIDEO BRONCHOSCOPY WITH ENDOBRONCHIAL NAVIGATION: SHX6175

## 2021-01-30 HISTORY — PX: NODE DISSECTION: SHX5269

## 2021-01-30 HISTORY — PX: OTHER SURGICAL HISTORY: SHX169

## 2021-01-30 LAB — POCT I-STAT 7, (LYTES, BLD GAS, ICA,H+H)
Acid-base deficit: 2 mmol/L (ref 0.0–2.0)
Bicarbonate: 24.1 mmol/L (ref 20.0–28.0)
Calcium, Ion: 1.18 mmol/L (ref 1.15–1.40)
HCT: 30 % — ABNORMAL LOW (ref 36.0–46.0)
Hemoglobin: 10.2 g/dL — ABNORMAL LOW (ref 12.0–15.0)
O2 Saturation: 94 %
Potassium: 3.5 mmol/L (ref 3.5–5.1)
Sodium: 140 mmol/L (ref 135–145)
TCO2: 26 mmol/L (ref 22–32)
pCO2 arterial: 45.7 mmHg (ref 32.0–48.0)
pH, Arterial: 7.331 — ABNORMAL LOW (ref 7.350–7.450)
pO2, Arterial: 74 mmHg — ABNORMAL LOW (ref 83.0–108.0)

## 2021-01-30 LAB — PREPARE RBC (CROSSMATCH)

## 2021-01-30 LAB — GLUCOSE, CAPILLARY: Glucose-Capillary: 138 mg/dL — ABNORMAL HIGH (ref 70–99)

## 2021-01-30 SURGERY — RESECTION, LUNG, SEGMENTAL, ROBOT-ASSISTED
Anesthesia: General | Site: Chest | Laterality: Right

## 2021-01-30 MED ORDER — MOMETASONE FURO-FORMOTEROL FUM 200-5 MCG/ACT IN AERO
2.0000 | INHALATION_SPRAY | Freq: Two times a day (BID) | RESPIRATORY_TRACT | Status: DC
  Administered 2021-01-31 – 2021-02-02 (×5): 2 via RESPIRATORY_TRACT
  Filled 2021-01-30: qty 8.8

## 2021-01-30 MED ORDER — 0.9 % SODIUM CHLORIDE (POUR BTL) OPTIME
TOPICAL | Status: DC | PRN
  Administered 2021-01-30 (×3): 1000 mL

## 2021-01-30 MED ORDER — BUPIVACAINE LIPOSOME 1.3 % IJ SUSP
20.0000 mL | Freq: Once | INTRAMUSCULAR | Status: DC
  Filled 2021-01-30: qty 20

## 2021-01-30 MED ORDER — FLUTICASONE PROPIONATE 50 MCG/ACT NA SUSP
1.0000 | Freq: Every day | NASAL | Status: DC | PRN
  Filled 2021-01-30: qty 16

## 2021-01-30 MED ORDER — ACETAMINOPHEN 10 MG/ML IV SOLN: 1000.0000 mg | Freq: Once | INTRAVENOUS | Status: DC | PRN

## 2021-01-30 MED ORDER — LACTATED RINGERS IV SOLN: INTRAVENOUS | Status: DC | PRN

## 2021-01-30 MED ORDER — BUPIVACAINE LIPOSOME 1.3 % IJ SUSP: INTRAMUSCULAR | Status: DC | PRN

## 2021-01-30 MED ORDER — SCOPOLAMINE 1 MG/3DAYS TD PT72
MEDICATED_PATCH | TRANSDERMAL | Status: DC | PRN
  Administered 2021-01-30: 1 via TRANSDERMAL

## 2021-01-30 MED ORDER — KETOROLAC TROMETHAMINE 15 MG/ML IJ SOLN
15.0000 mg | Freq: Four times a day (QID) | INTRAMUSCULAR | Status: AC
  Administered 2021-01-30 – 2021-02-01 (×8): 15 mg via INTRAVENOUS
  Filled 2021-01-30 (×8): qty 1

## 2021-01-30 MED ORDER — SUGAMMADEX SODIUM 200 MG/2ML IV SOLN
INTRAVENOUS | Status: DC | PRN
  Administered 2021-01-30: 200 mg via INTRAVENOUS

## 2021-01-30 MED ORDER — PROPOFOL 10 MG/ML IV BOLUS
INTRAVENOUS | Status: DC | PRN
  Administered 2021-01-30: 120 mg via INTRAVENOUS

## 2021-01-30 MED ORDER — ACETAMINOPHEN 160 MG/5ML PO SOLN
1000.0000 mg | Freq: Four times a day (QID) | ORAL | Status: DC
  Administered 2021-01-31: 1000 mg via ORAL
  Filled 2021-01-30: qty 40.6

## 2021-01-30 MED ORDER — METOPROLOL SUCCINATE ER 50 MG PO TB24
50.0000 mg | ORAL_TABLET | Freq: Every day | ORAL | Status: DC
  Administered 2021-01-31 – 2021-02-02 (×3): 50 mg via ORAL
  Filled 2021-01-30 (×3): qty 1

## 2021-01-30 MED ORDER — AMISULPRIDE (ANTIEMETIC) 5 MG/2ML IV SOLN: 10.0000 mg | Freq: Once | INTRAVENOUS | Status: DC | PRN

## 2021-01-30 MED ORDER — FENTANYL CITRATE (PF) 250 MCG/5ML IJ SOLN
INTRAMUSCULAR | Status: AC
  Filled 2021-01-30: qty 5

## 2021-01-30 MED ORDER — MUPIROCIN 2 % EX OINT
1.0000 "application " | TOPICAL_OINTMENT | Freq: Two times a day (BID) | CUTANEOUS | Status: DC
  Administered 2021-01-31 – 2021-02-01 (×4): 1 via NASAL
  Filled 2021-01-30 (×2): qty 22

## 2021-01-30 MED ORDER — BUPIVACAINE HCL (PF) 0.5 % IJ SOLN
INTRAMUSCULAR | Status: DC | PRN
  Administered 2021-01-30: 80 mL

## 2021-01-30 MED ORDER — ALBUTEROL SULFATE HFA 108 (90 BASE) MCG/ACT IN AERS
2.0000 | INHALATION_SPRAY | Freq: Four times a day (QID) | RESPIRATORY_TRACT | Status: DC | PRN
  Filled 2021-01-30: qty 6.7

## 2021-01-30 MED ORDER — SENNOSIDES-DOCUSATE SODIUM 8.6-50 MG PO TABS
1.0000 | ORAL_TABLET | Freq: Every day | ORAL | Status: DC
  Administered 2021-01-30 – 2021-02-01 (×3): 1 via ORAL
  Filled 2021-01-30 (×3): qty 1

## 2021-01-30 MED ORDER — MIDAZOLAM HCL 2 MG/2ML IJ SOLN
INTRAMUSCULAR | Status: DC | PRN
  Administered 2021-01-30: 1 mg via INTRAVENOUS

## 2021-01-30 MED ORDER — LISINOPRIL 2.5 MG PO TABS
2.5000 mg | ORAL_TABLET | Freq: Every evening | ORAL | Status: DC
  Administered 2021-01-30 – 2021-02-01 (×3): 2.5 mg via ORAL
  Filled 2021-01-30 (×3): qty 1

## 2021-01-30 MED ORDER — SODIUM CHLORIDE 0.9 % IV SOLN
INTRAVENOUS | Status: AC
  Filled 2021-01-30: qty 1.2

## 2021-01-30 MED ORDER — BUPIVACAINE HCL (PF) 0.5 % IJ SOLN
INTRAMUSCULAR | Status: AC
  Filled 2021-01-30: qty 30

## 2021-01-30 MED ORDER — CEFAZOLIN SODIUM-DEXTROSE 2-4 GM/100ML-% IV SOLN
2.0000 g | INTRAVENOUS | Status: AC
  Administered 2021-01-30: 2 g via INTRAVENOUS
  Filled 2021-01-30: qty 100

## 2021-01-30 MED ORDER — CEFAZOLIN SODIUM-DEXTROSE 2-4 GM/100ML-% IV SOLN
2.0000 g | Freq: Three times a day (TID) | INTRAVENOUS | Status: AC
  Administered 2021-01-30 – 2021-01-31 (×2): 2 g via INTRAVENOUS
  Filled 2021-01-30 (×2): qty 100

## 2021-01-30 MED ORDER — SODIUM CHLORIDE 0.9 % IR SOLN
Status: DC | PRN
  Administered 2021-01-30: 1000 mL

## 2021-01-30 MED ORDER — MIDAZOLAM HCL 2 MG/2ML IJ SOLN
INTRAMUSCULAR | Status: AC
  Filled 2021-01-30: qty 2

## 2021-01-30 MED ORDER — PANTOPRAZOLE SODIUM 40 MG PO TBEC
40.0000 mg | DELAYED_RELEASE_TABLET | Freq: Every day | ORAL | Status: DC
  Administered 2021-01-31 – 2021-02-02 (×3): 40 mg via ORAL
  Filled 2021-01-30 (×3): qty 1

## 2021-01-30 MED ORDER — BISACODYL 5 MG PO TBEC
10.0000 mg | DELAYED_RELEASE_TABLET | Freq: Every day | ORAL | Status: DC
  Administered 2021-01-30 – 2021-02-01 (×2): 10 mg via ORAL
  Filled 2021-01-30 (×4): qty 2

## 2021-01-30 MED ORDER — PROPOFOL 10 MG/ML IV BOLUS
INTRAVENOUS | Status: AC
  Filled 2021-01-30: qty 20

## 2021-01-30 MED ORDER — EPINEPHRINE PF 1 MG/ML IJ SOLN
INTRAMUSCULAR | Status: AC
  Filled 2021-01-30: qty 1

## 2021-01-30 MED ORDER — FENTANYL CITRATE (PF) 100 MCG/2ML IJ SOLN
25.0000 ug | INTRAMUSCULAR | Status: DC | PRN
  Administered 2021-01-30 (×2): 25 ug via INTRAVENOUS

## 2021-01-30 MED ORDER — ONDANSETRON HCL 4 MG/2ML IJ SOLN: 4.0000 mg | Freq: Once | INTRAMUSCULAR | Status: DC | PRN

## 2021-01-30 MED ORDER — APREPITANT 40 MG PO CAPS
40.0000 mg | ORAL_CAPSULE | ORAL | Status: AC
  Administered 2021-01-30: 40 mg via ORAL
  Filled 2021-01-30: qty 1

## 2021-01-30 MED ORDER — CHLORHEXIDINE GLUCONATE CLOTH 2 % EX PADS
6.0000 | MEDICATED_PAD | Freq: Every day | CUTANEOUS | Status: DC
  Administered 2021-01-31 – 2021-02-02 (×3): 6 via TOPICAL

## 2021-01-30 MED ORDER — ACETAMINOPHEN 500 MG PO TABS
1000.0000 mg | ORAL_TABLET | Freq: Four times a day (QID) | ORAL | Status: DC
  Administered 2021-01-30 – 2021-02-02 (×11): 1000 mg via ORAL
  Filled 2021-01-30 (×11): qty 2

## 2021-01-30 MED ORDER — HEMOSTATIC AGENTS (NO CHARGE) OPTIME
TOPICAL | Status: DC | PRN
  Administered 2021-01-30: 2 via TOPICAL
  Administered 2021-01-30: 1 via TOPICAL

## 2021-01-30 MED ORDER — DEXAMETHASONE SODIUM PHOSPHATE 10 MG/ML IJ SOLN
INTRAMUSCULAR | Status: DC | PRN
  Administered 2021-01-30: 10 mg via INTRAVENOUS

## 2021-01-30 MED ORDER — ROCURONIUM BROMIDE 10 MG/ML (PF) SYRINGE
PREFILLED_SYRINGE | INTRAVENOUS | Status: DC | PRN
  Administered 2021-01-30: 50 mg via INTRAVENOUS
  Administered 2021-01-30: 10 mg via INTRAVENOUS
  Administered 2021-01-30: 60 mg via INTRAVENOUS
  Administered 2021-01-30: 40 mg via INTRAVENOUS
  Administered 2021-01-30: 50 mg via INTRAVENOUS
  Administered 2021-01-30: 10 mg via INTRAVENOUS

## 2021-01-30 MED ORDER — PHENYLEPHRINE HCL-NACL 10-0.9 MG/250ML-% IV SOLN
INTRAVENOUS | Status: DC | PRN
  Administered 2021-01-30: 20 ug/min via INTRAVENOUS

## 2021-01-30 MED ORDER — RIVAROXABAN 10 MG PO TABS
10.0000 mg | ORAL_TABLET | Freq: Every day | ORAL | Status: DC
  Administered 2021-01-31 – 2021-02-01 (×2): 10 mg via ORAL
  Filled 2021-01-30 (×3): qty 1

## 2021-01-30 MED ORDER — DEXTROSE-NACL 5-0.9 % IV SOLN: INTRAVENOUS | Status: DC

## 2021-01-30 MED ORDER — FENTANYL CITRATE (PF) 100 MCG/2ML IJ SOLN
INTRAMUSCULAR | Status: AC
  Filled 2021-01-30: qty 2

## 2021-01-30 MED ORDER — ONDANSETRON HCL 4 MG/2ML IJ SOLN: 4.0000 mg | Freq: Four times a day (QID) | INTRAMUSCULAR | Status: DC | PRN

## 2021-01-30 MED ORDER — CHLORHEXIDINE GLUCONATE 0.12 % MT SOLN
OROMUCOSAL | Status: AC
  Administered 2021-01-30: 15 mL
  Filled 2021-01-30: qty 15

## 2021-01-30 MED ORDER — FENTANYL CITRATE (PF) 250 MCG/5ML IJ SOLN
INTRAMUSCULAR | Status: DC | PRN
  Administered 2021-01-30 (×2): 50 ug via INTRAVENOUS
  Administered 2021-01-30: 100 ug via INTRAVENOUS
  Administered 2021-01-30: 50 ug via INTRAVENOUS
  Administered 2021-01-30: 100 ug via INTRAVENOUS

## 2021-01-30 MED ORDER — CHLORHEXIDINE GLUCONATE CLOTH 2 % EX PADS
6.0000 | MEDICATED_PAD | Freq: Every day | CUTANEOUS | Status: DC
  Administered 2021-01-30: 6 via TOPICAL

## 2021-01-30 MED ORDER — TRAMADOL HCL 50 MG PO TABS
50.0000 mg | ORAL_TABLET | Freq: Four times a day (QID) | ORAL | Status: DC | PRN
  Administered 2021-01-31 (×2): 50 mg via ORAL
  Filled 2021-01-30: qty 2
  Filled 2021-01-30: qty 1

## 2021-01-30 MED ORDER — METHYLENE BLUE 0.5 % INJ SOLN
INTRAVENOUS | Status: DC | PRN
  Administered 2021-01-30: .5 mL

## 2021-01-30 MED ORDER — LIDOCAINE 2% (20 MG/ML) 5 ML SYRINGE
INTRAMUSCULAR | Status: DC | PRN
  Administered 2021-01-30: 60 mg via INTRAVENOUS

## 2021-01-30 MED ORDER — PROPOFOL 500 MG/50ML IV EMUL
INTRAVENOUS | Status: DC | PRN
  Administered 2021-01-30: 100 ug/kg/min via INTRAVENOUS
  Administered 2021-01-30: 150 ug/kg/min via INTRAVENOUS

## 2021-01-30 MED ORDER — ONDANSETRON HCL 4 MG/2ML IJ SOLN
INTRAMUSCULAR | Status: DC | PRN
  Administered 2021-01-30: 4 mg via INTRAVENOUS

## 2021-01-30 MED ORDER — METHYLENE BLUE 0.5 % INJ SOLN
INTRAVENOUS | Status: AC
  Filled 2021-01-30: qty 10

## 2021-01-30 MED ORDER — ADULT MULTIVITAMIN W/MINERALS CH
1.0000 | ORAL_TABLET | ORAL | Status: DC
  Administered 2021-01-30 – 2021-02-01 (×2): 1 via ORAL
  Filled 2021-01-30 (×3): qty 1

## 2021-01-30 SURGICAL SUPPLY — 155 items
ADAPTER BRONCHOSCOPE OLYMPUS (ADAPTER) ×4 IMPLANT
ADAPTER VALVE BIOPSY EBUS (MISCELLANEOUS) IMPLANT
ADPTR VALVE BIOPSY EBUS (MISCELLANEOUS)
APPLIER CLIP ROT 10 11.4 M/L (STAPLE)
BLADE CLIPPER SURG (BLADE) IMPLANT
BLADE SURG SZ11 CARB STEEL (BLADE) IMPLANT
BNDG COHESIVE 6X5 TAN STRL LF (GAUZE/BANDAGES/DRESSINGS) IMPLANT
BRUSH BIOPSY BRONCH 10 SDTNB (MISCELLANEOUS) IMPLANT
BRUSH SUPERTRAX BIOPSY (INSTRUMENTS) IMPLANT
BRUSH SUPERTRAX NDL-TIP CYTO (INSTRUMENTS) ×4 IMPLANT
CANISTER SUCT 3000ML PPV (MISCELLANEOUS) ×8 IMPLANT
CANNULA REDUC XI 12-8 STAPL (CANNULA) ×8
CANNULA REDUCER 12-8 DVNC XI (CANNULA) ×6 IMPLANT
CATH THORACIC 28FR (CATHETERS) IMPLANT
CATH THORACIC 28FR RT ANG (CATHETERS) IMPLANT
CATH THORACIC 36FR (CATHETERS) IMPLANT
CATH THORACIC 36FR RT ANG (CATHETERS) IMPLANT
CLIP APPLIE ROT 10 11.4 M/L (STAPLE) IMPLANT
CLIP LIGATING HEM O LOK PURPLE (MISCELLANEOUS) ×4 IMPLANT
CLIP LIGATING HEMO O LOK GREEN (MISCELLANEOUS) ×4 IMPLANT
CLIP TI WIDE RED SMALL 6 (CLIP) ×4 IMPLANT
CLIP VESOCCLUDE MED 6/CT (CLIP) IMPLANT
CNTNR URN SCR LID CUP LEK RST (MISCELLANEOUS) ×39 IMPLANT
CONN ST 1/4X3/8  BEN (MISCELLANEOUS)
CONN ST 1/4X3/8 BEN (MISCELLANEOUS) IMPLANT
CONN Y 3/8X3/8X3/8  BEN (MISCELLANEOUS)
CONN Y 3/8X3/8X3/8 BEN (MISCELLANEOUS) IMPLANT
CONT SPEC 4OZ STRL OR WHT (MISCELLANEOUS) ×39
COVER BACK TABLE 60X90IN (DRAPES) ×4 IMPLANT
DEFOGGER SCOPE WARMER CLEARIFY (MISCELLANEOUS) ×4 IMPLANT
DERMABOND ADVANCED (GAUZE/BANDAGES/DRESSINGS) ×1
DERMABOND ADVANCED .7 DNX12 (GAUZE/BANDAGES/DRESSINGS) ×3 IMPLANT
DRAIN CHANNEL 28F RND 3/8 FF (WOUND CARE) ×4 IMPLANT
DRAIN CHANNEL 32F RND 10.7 FF (WOUND CARE) IMPLANT
DRAPE ARM DVNC X/XI (DISPOSABLE) ×12 IMPLANT
DRAPE COLUMN DVNC XI (DISPOSABLE) ×3 IMPLANT
DRAPE CV SPLIT W-CLR ANES SCRN (DRAPES) ×4 IMPLANT
DRAPE DA VINCI XI ARM (DISPOSABLE) ×12
DRAPE DA VINCI XI COLUMN (DISPOSABLE) ×3
DRAPE INCISE IOBAN 66X45 STRL (DRAPES) IMPLANT
DRAPE ORTHO SPLIT 77X108 STRL (DRAPES) ×4
DRAPE SURG ORHT 6 SPLT 77X108 (DRAPES) ×3 IMPLANT
DRAPE WARM FLUID 44X44 (DRAPES) IMPLANT
ELECT BLADE 6.5 EXT (BLADE) ×4 IMPLANT
ELECT REM PT RETURN 9FT ADLT (ELECTROSURGICAL) ×4
ELECTRODE REM PT RTRN 9FT ADLT (ELECTROSURGICAL) ×3 IMPLANT
FILTER STRAW FLUID ASPIR (MISCELLANEOUS) IMPLANT
FORCEPS BIOP SUPERTRX PREMAR (INSTRUMENTS) IMPLANT
GAUZE KITTNER 4X5 RF (MISCELLANEOUS) ×12 IMPLANT
GAUZE SPONGE 4X4 12PLY STRL (GAUZE/BANDAGES/DRESSINGS) IMPLANT
GLOVE SURG SIGNA 7.5 PF LTX (GLOVE) ×16 IMPLANT
GLOVE SURG UNDER POLY LF SZ6.5 (GLOVE) ×16 IMPLANT
GLOVE SURG UNDER POLY LF SZ7.5 (GLOVE) ×4 IMPLANT
GLOVE TRIUMPH SURG SIZE 7.5 (KITS) ×8 IMPLANT
GOWN STRL REUS W/ TWL LRG LVL3 (GOWN DISPOSABLE) ×15 IMPLANT
GOWN STRL REUS W/ TWL XL LVL3 (GOWN DISPOSABLE) ×9 IMPLANT
GOWN STRL REUS W/TWL 2XL LVL3 (GOWN DISPOSABLE) ×4 IMPLANT
GOWN STRL REUS W/TWL LRG LVL3 (GOWN DISPOSABLE) ×15
GOWN STRL REUS W/TWL XL LVL3 (GOWN DISPOSABLE) ×6
HEMOSTAT SURGICEL 2X14 (HEMOSTASIS) ×8 IMPLANT
IRRIGATION STRYKERFLOW (MISCELLANEOUS) ×3 IMPLANT
IRRIGATOR STRYKERFLOW (MISCELLANEOUS) ×4
KIT BASIN OR (CUSTOM PROCEDURE TRAY) ×4 IMPLANT
KIT CLEAN ENDO COMPLIANCE (KITS) ×4 IMPLANT
KIT ILLUMISITE 180 PROCEDURE (KITS) IMPLANT
KIT ILLUMISITE 90 PROCEDURE (KITS) ×4 IMPLANT
KIT SUCTION CATH 14FR (SUCTIONS) IMPLANT
KIT TURNOVER KIT B (KITS) ×4 IMPLANT
LOOP VESSEL SUPERMAXI WHITE (MISCELLANEOUS) IMPLANT
MARKER SKIN DUAL TIP RULER LAB (MISCELLANEOUS) ×4 IMPLANT
NEEDLE HYPO 25GX1X1/2 BEV (NEEDLE) ×4 IMPLANT
NEEDLE SPNL 22GX3.5 QUINCKE BK (NEEDLE) ×4 IMPLANT
NEEDLE SUPERTRX PREMARK BIOPSY (NEEDLE) IMPLANT
NS IRRIG 1000ML POUR BTL (IV SOLUTION) ×12 IMPLANT
OIL SILICONE PENTAX (PARTS (SERVICE/REPAIRS)) ×4 IMPLANT
PACK CHEST (CUSTOM PROCEDURE TRAY) ×4 IMPLANT
PAD ARMBOARD 7.5X6 YLW CONV (MISCELLANEOUS) ×8 IMPLANT
PATCHES PATIENT (LABEL) ×12 IMPLANT
PORT ACCESS TROCAR AIRSEAL 12 (TROCAR) ×3 IMPLANT
PORT ACCESS TROCAR AIRSEAL 5M (TROCAR) ×1
PROGEL SPRAY TIP 11IN (MISCELLANEOUS) ×4
RELOAD STAPLE TA45 3.5 REG BLU (ENDOMECHANICALS) ×4 IMPLANT
RELOAD STAPLER 2.5X45 WHT DVNC (STAPLE) ×6 IMPLANT
RELOAD STAPLER 3.5X45 BLU DVNC (STAPLE) ×15 IMPLANT
RELOAD STAPLER 4.3X45 GRN DVNC (STAPLE) ×12 IMPLANT
RELOAD STAPLER 45 4.6 BLK DVNC (STAPLE) ×3 IMPLANT
SCISSORS LAP 5X35 DISP (ENDOMECHANICALS) IMPLANT
SEAL CANN UNIV 5-8 DVNC XI (MISCELLANEOUS) ×6 IMPLANT
SEAL XI 5MM-8MM UNIVERSAL (MISCELLANEOUS) ×6
SEALANT PROGEL (MISCELLANEOUS) ×4 IMPLANT
SEALANT SURG COSEAL 4ML (VASCULAR PRODUCTS) IMPLANT
SEALANT SURG COSEAL 8ML (VASCULAR PRODUCTS) IMPLANT
SEALER SYNCHRO 8 IS4000 DV (MISCELLANEOUS) ×4
SEALER SYNCHRO 8 IS4000 DVNC (MISCELLANEOUS) ×3 IMPLANT
SET TRI-LUMEN FLTR TB AIRSEAL (TUBING) ×4 IMPLANT
SHEARS HARMONIC HDI 20CM (ELECTROSURGICAL) IMPLANT
SHEET MEDIUM DRAPE 40X70 STRL (DRAPES) ×4 IMPLANT
SOLUTION ELECTROLUBE (MISCELLANEOUS) ×4 IMPLANT
SPECIMEN JAR MEDIUM (MISCELLANEOUS) IMPLANT
SPONGE INTESTINAL PEANUT (DISPOSABLE) IMPLANT
SPONGE TONSIL TAPE 1 RFD (DISPOSABLE) IMPLANT
STAPLE RELOAD 45 2.0 GRAY (STAPLE) ×4
STAPLE RELOAD 45 2.0 GRAY DVNC (STAPLE) ×3 IMPLANT
STAPLER 45 SUREFORM CVD (STAPLE) ×2
STAPLER 45 SUREFORM CVD DVNC (STAPLE) ×3 IMPLANT
STAPLER CANNULA SEAL DVNC XI (STAPLE) ×6 IMPLANT
STAPLER CANNULA SEAL XI (STAPLE) ×6
STAPLER ENDO NO KNIFE (STAPLE) ×4 IMPLANT
STAPLER RELOAD 2.5X45 WHITE (STAPLE) ×6
STAPLER RELOAD 2.5X45 WHT DVNC (STAPLE) ×6
STAPLER RELOAD 3.5X45 BLU DVNC (STAPLE) ×15
STAPLER RELOAD 3.5X45 BLUE (STAPLE) ×15
STAPLER RELOAD 4.3X45 GREEN (STAPLE) ×16
STAPLER RELOAD 4.3X45 GRN DVNC (STAPLE) ×12
STAPLER RELOAD 45 4.6 BLK (STAPLE) ×3
STAPLER RELOAD 45 4.6 BLK DVNC (STAPLE) ×3
SUT PDS AB 3-0 SH 27 (SUTURE) IMPLANT
SUT PROLENE 4 0 RB 1 (SUTURE)
SUT PROLENE 4-0 RB1 .5 CRCL 36 (SUTURE) IMPLANT
SUT SILK  1 MH (SUTURE) ×3
SUT SILK 1 MH (SUTURE) ×3 IMPLANT
SUT SILK 1 TIES 10X30 (SUTURE) IMPLANT
SUT SILK 2 0 SH (SUTURE) IMPLANT
SUT SILK 2 0SH CR/8 30 (SUTURE) ×4 IMPLANT
SUT SILK 3 0 SH 30 (SUTURE) IMPLANT
SUT SILK 3 0SH CR/8 30 (SUTURE) IMPLANT
SUT VIC AB 1 CTX 36 (SUTURE)
SUT VIC AB 1 CTX36XBRD ANBCTR (SUTURE) IMPLANT
SUT VIC AB 2-0 CTX 36 (SUTURE) IMPLANT
SUT VIC AB 3-0 MH 27 (SUTURE) IMPLANT
SUT VIC AB 3-0 X1 27 (SUTURE) ×8 IMPLANT
SUT VICRYL 0 TIES 12 18 (SUTURE) ×4 IMPLANT
SUT VICRYL 0 UR6 27IN ABS (SUTURE) ×8 IMPLANT
SUT VICRYL 2 TP 1 (SUTURE) IMPLANT
SYR 20ML ECCENTRIC (SYRINGE) ×4 IMPLANT
SYR 20ML LL LF (SYRINGE) ×12 IMPLANT
SYR 30ML LL (SYRINGE) IMPLANT
SYR 5ML LL (SYRINGE) IMPLANT
SYR TB 1ML LUER SLIP (SYRINGE) IMPLANT
SYSTEM RETRIEVAL ANCHOR 12 (MISCELLANEOUS) ×4 IMPLANT
SYSTEM SAHARA CHEST DRAIN ATS (WOUND CARE) ×4 IMPLANT
TAPE CLOTH 4X10 WHT NS (GAUZE/BANDAGES/DRESSINGS) ×4 IMPLANT
TIP APPLICATOR SPRAY EXTEND 16 (VASCULAR PRODUCTS) IMPLANT
TIP SPRAY PROGEL 11IN (MISCELLANEOUS) ×3 IMPLANT
TOWEL GREEN STERILE (TOWEL DISPOSABLE) ×4 IMPLANT
TOWEL GREEN STERILE FF (TOWEL DISPOSABLE) ×4 IMPLANT
TRAP SPECIMEN MUCUS 40CC (MISCELLANEOUS) IMPLANT
TRAY FOLEY MTR SLVR 16FR STAT (SET/KITS/TRAYS/PACK) ×4 IMPLANT
TROCAR BLADELESS 15MM (ENDOMECHANICALS) IMPLANT
TUBE CONNECTING 20X1/4 (TUBING) ×8 IMPLANT
UNDERPAD 30X36 HEAVY ABSORB (UNDERPADS AND DIAPERS) ×4 IMPLANT
VALVE BIOPSY  SINGLE USE (MISCELLANEOUS) ×3
VALVE BIOPSY SINGLE USE (MISCELLANEOUS) ×3 IMPLANT
VALVE SUCTION BRONCHIO DISP (MISCELLANEOUS) ×4 IMPLANT
WATER STERILE IRR 1000ML POUR (IV SOLUTION) ×8 IMPLANT

## 2021-01-30 NOTE — Anesthesia Procedure Notes (Signed)
Arterial Line Insertion Start/End3/01/2021 8:10 AM, 01/30/2021 8:15 AM Performed by: Darral Dash, DO, anesthesiologist  Patient location: Pre-op. Preanesthetic checklist: patient identified, IV checked, site marked, risks and benefits discussed, surgical consent, monitors and equipment checked, pre-op evaluation, timeout performed and anesthesia consent Lidocaine 1% used for infiltration Left, radial was placed Catheter size: 20 Fr Hand hygiene performed  and maximum sterile barriers used   Attempts: 1 Procedure performed using ultrasound guided technique. Following insertion, dressing applied and Biopatch. Post procedure assessment: normal and unchanged  Patient tolerated the procedure well with no immediate complications. Additional procedure comments: Initial attempt by CRNA team unsuccessful. U/S guidance single attempt sterile conditions by Aleza Pew. Marland Kitchen

## 2021-01-30 NOTE — Interval H&P Note (Signed)
History and Physical Interval Note:  01/30/2021 8:47 AM  Jessica Wells  has presented today for surgery, with the diagnosis of RUL NODULE.  The various methods of treatment have been discussed with the patient and family. After consideration of risks, benefits and other options for treatment, the patient has consented to  Procedure(s): XI ROBOTIC ASSISTED THORACOSCOPY-RIGHT UPPER LOBE SEGMENTECTOMY OR LOBECTOMY (Right) VIDEO BRONCHOSCOPY WITH ENDOBRONCHIAL NAVIGATION FOR TUMOR MARKING (N/A) as a surgical intervention.  The patient's history has been reviewed, patient examined, no change in status, stable for surgery.  I have reviewed the patient's chart and labs.  Questions were answered to the patient's satisfaction.     [name]

## 2021-01-30 NOTE — Progress Notes (Signed)
Admission from PACU by bed awake and alert. , pt is teary. Made comfortable on bed. Congested. Oral suction done , obtained small amount of thick secretions.

## 2021-01-30 NOTE — Anesthesia Procedure Notes (Addendum)
Procedure Name: Intubation Date/Time: 01/30/2021 9:12 AM Performed by: Darletta Moll, CRNA Pre-anesthesia Checklist: Patient identified, Emergency Drugs available, Suction available and Patient being monitored Patient Re-evaluated:Patient Re-evaluated prior to induction Oxygen Delivery Method: Circle system utilized Preoxygenation: Pre-oxygenation with 100% oxygen Induction Type: IV induction Ventilation: Mask ventilation without difficulty and Oral airway inserted - appropriate to patient size Laryngoscope Size: 4 and Glidescope Grade View: Grade II Tube type: Oral Tube size: 8.0 mm Number of attempts: 1 Airway Equipment and Method: Video-laryngoscopy and Rigid stylet Placement Confirmation: ETT inserted through vocal cords under direct vision,  positive ETCO2 and breath sounds checked- equal and bilateral Secured at: 21 cm Tube secured with: Tape Dental Injury: Teeth and Oropharynx as per pre-operative assessment  Difficulty Due To: Difficult Airway- due to anterior larynx Future Recommendations: Recommend- induction with short-acting agent, and alternative techniques readily available Comments: DL x1 attempt with Mac 3 Grade 4 view by S Zylen Wenig CRNA, DL x2 by Dr. Gloris Manchester with a Mac 3 and Miller 2, poor view. Decision to switch to glidescope for intubation.

## 2021-01-30 NOTE — Anesthesia Procedure Notes (Signed)
Procedure Name: Intubation Date/Time: 01/30/2021 10:53 AM Performed by: Darral Dash, DO Pre-anesthesia Checklist: Patient identified, Emergency Drugs available, Suction available and Patient being monitored Patient Re-evaluated:Patient Re-evaluated prior to induction Oxygen Delivery Method: Circle system utilized Preoxygenation: Pre-oxygenation with 100% oxygen Induction Type: IV induction Ventilation: Mask ventilation without difficulty Laryngoscope Size: 4 and Robertshaw Grade View: Grade I Tube type: Oral Endobronchial tube: Left, Double lumen EBT, EBT position confirmed by auscultation and EBT position confirmed by fiberoptic bronchoscope and 35 Fr Number of attempts: 1 Airway Equipment and Method: Video-laryngoscopy and Bougie stylet Placement Confirmation: ETT inserted through vocal cords under direct vision,  positive ETCO2 and breath sounds checked- equal and bilateral Secured at: 30 cm Tube secured with: Tape Dental Injury: Teeth and Oropharynx as per pre-operative assessment

## 2021-01-30 NOTE — Transfer of Care (Signed)
Immediate Anesthesia Transfer of Care Note  Patient: Jessica Wells  Procedure(s) Performed: XI ROBOTIC ASSISTED THORACOSCOPY-POSTERIOR SEGMENTECTOMY RIGHT UPPER LOBE (Right Chest) VIDEO BRONCHOSCOPY WITH ENDOBRONCHIAL NAVIGATION FOR TUMOR MARKING (N/A ) NODE DISSECTION (Right ) INTERCOSTAL NERVE BLOCK (Right )  Patient Location: PACU  Anesthesia Type:General  Level of Consciousness: drowsy and patient cooperative  Airway & Oxygen Therapy: Patient Spontanous Breathing and Patient connected to face mask oxygen  Post-op Assessment: Report given to RN, Post -op Vital signs reviewed and stable and Patient moving all extremities X 4  Post vital signs: Reviewed and stable  Last Vitals:  Vitals Value Taken Time  BP 145/83 01/30/21 1510  Temp    Pulse 68 01/30/21 1518  Resp 17 01/30/21 1518  SpO2 98 % 01/30/21 1518  Vitals shown include unvalidated device data.  Last Pain:  Vitals:   01/30/21 0712  TempSrc:   PainSc: 0-No pain         Complications: No complications documented.

## 2021-01-30 NOTE — Plan of Care (Signed)
  Problem: Education: Goal: Knowledge of General Education information will improve Description: Including pain rating scale, medication(s)/side effects and non-pharmacologic comfort measures Outcome: Progressing   Problem: Education: Goal: Knowledge of disease or condition will improve Outcome: Progressing Goal: Knowledge of the prescribed therapeutic regimen will improve Outcome: Progressing   Problem: Activity: Goal: Risk for activity intolerance will decrease Outcome: Progressing   Problem: Cardiac: Goal: Will achieve and/or maintain hemodynamic stability Outcome: Progressing   Problem: Clinical Measurements: Goal: Postoperative complications will be avoided or minimized Outcome: Progressing   Problem: Respiratory: Goal: Respiratory status will improve Outcome: Progressing   Problem: Pain Management: Goal: Pain level will decrease Outcome: Progressing   Problem: Skin Integrity: Goal: Wound healing without signs and symptoms infection will improve Outcome: Progressing

## 2021-01-30 NOTE — Anesthesia Postprocedure Evaluation (Signed)
Anesthesia Post Note  Patient: Jessica Wells  Procedure(s) Performed: XI ROBOTIC ASSISTED THORACOSCOPY-POSTERIOR SEGMENTECTOMY RIGHT UPPER LOBE (Right Chest) VIDEO BRONCHOSCOPY WITH ENDOBRONCHIAL NAVIGATION FOR TUMOR MARKING (N/A ) NODE DISSECTION (Right ) INTERCOSTAL NERVE BLOCK (Right )     Patient location during evaluation: PACU Anesthesia Type: General Level of consciousness: awake and alert Pain management: pain level controlled Vital Signs Assessment: post-procedure vital signs reviewed and stable Respiratory status: spontaneous breathing, nonlabored ventilation, respiratory function stable and patient connected to nasal cannula oxygen Cardiovascular status: blood pressure returned to baseline and stable Postop Assessment: no apparent nausea or vomiting Anesthetic complications: no   No complications documented.  Last Vitals:  Vitals:   01/30/21 1656 01/30/21 1732  BP: (!) 154/66 (!) 165/80  Pulse: 65 72  Resp: 13 17  Temp: 37 C 36.8 C  SpO2: 98% 97%    Last Pain:  Vitals:   01/30/21 1732  TempSrc: Oral  PainSc: 0-No pain                 Belenda Cruise P Chauntelle Azpeitia

## 2021-01-30 NOTE — Brief Op Note (Addendum)
01/30/2021  2:51 PM  PATIENT:  Jessica Wells  75 y.o. female  PRE-OPERATIVE DIAGNOSIS:  RIGHT UPPER LOBE LUNG NODULE  POST-OPERATIVE DIAGNOSIS:  ADENOCARCINOMA RIGHT UPPER LOBE- CLINICAL STAGE IA(T1N0)  PROCEDURE:  Procedure(s): XI ROBOTIC ASSISTED THORACOSCOPY-POSTERIOR SEGMENTECTOMY RIGHT UPPER LOBE (Right) VIDEO BRONCHOSCOPY WITH ENDOBRONCHIAL NAVIGATION FOR TUMOR MARKING (N/A) NODE DISSECTION (Right) INTERCOSTAL NERVE BLOCK (Right)  SURGEON:  Surgeon(s) and Role:    * [name], MD - Primary  PHYSICIAN ASSISTANT: WAYNE GOLD PA-C  ANESTHESIA:   general  EBL:  400 mL   BLOOD ADMINISTERED:none  DRAINS: (1 7 F) Blake drain(s) in the RIGHT HEMITHORAX   LOCAL MEDICATIONS USED:  BUPIVICAINE   SPECIMEN:  Source of Specimen:  RUL POST SEGMENTECTOMYM LN SAMPLES, WEDGE RESECTION SECOND MARGIN  DISPOSITION OF SPECIMEN:  PATHOLOGY  COUNTS:  YES  TOURNIQUET:  * No tourniquets in log *  DICTATION: .Other Dictation: Dictation Number [id number]  PLAN OF CARE: Admit to inpatient   PATIENT DISPOSITION:  PACU - hemodynamically stable.   Delay start of Pharmacological VTE agent (>24hrs) due to surgical blood loss or risk of bleeding: yes  COMPLICATIONS: NO KNOWN

## 2021-01-31 ENCOUNTER — Encounter (HOSPITAL_COMMUNITY): Payer: Self-pay | Admitting: Thoracic Surgery (Cardiothoracic Vascular Surgery)

## 2021-01-31 ENCOUNTER — Inpatient Hospital Stay (HOSPITAL_COMMUNITY): Payer: Medicare Other

## 2021-01-31 LAB — BASIC METABOLIC PANEL
Anion gap: 9 (ref 5–15)
BUN: 13 mg/dL (ref 8–23)
CO2: 25 mmol/L (ref 22–32)
Calcium: 9 mg/dL (ref 8.9–10.3)
Chloride: 103 mmol/L (ref 98–111)
Creatinine, Ser: 0.94 mg/dL (ref 0.44–1.00)
GFR, Estimated: >60 mL/min (ref >60)
Glucose, Bld: 232 mg/dL — ABNORMAL HIGH (ref 70–99)
Potassium: 4 mmol/L (ref 3.5–5.1)
Sodium: 137 mmol/L (ref 135–145)

## 2021-01-31 LAB — CBC
HCT: 34.7 % — ABNORMAL LOW (ref 36.0–46.0)
Hemoglobin: 11.8 g/dL — ABNORMAL LOW (ref 12.0–15.0)
MCH: 31.3 pg (ref 26.0–34.0)
MCHC: 34 g/dL (ref 30.0–36.0)
MCV: 92 fL (ref 80.0–100.0)
Platelets: 137 10*3/uL — ABNORMAL LOW (ref 150–400)
RBC: 3.77 MIL/uL — ABNORMAL LOW (ref 3.87–5.11)
RDW: 11.8 % (ref 11.5–15.5)
WBC: 9.5 10*3/uL (ref 4.0–10.5)
nRBC: 0 % (ref 0.0–0.2)

## 2021-01-31 MED ORDER — PHENOL 1.4 % MT LIQD
1.0000 | OROMUCOSAL | Status: DC | PRN
  Administered 2021-01-31: 1 via OROMUCOSAL
  Filled 2021-01-31: qty 177

## 2021-01-31 MED ORDER — GUAIFENESIN ER 600 MG PO TB12
600.0000 mg | ORAL_TABLET | Freq: Two times a day (BID) | ORAL | Status: DC
  Administered 2021-01-31 – 2021-02-01 (×4): 600 mg via ORAL
  Filled 2021-01-31 (×5): qty 1

## 2021-01-31 MED ORDER — SODIUM CHLORIDE 0.45 % IV SOLN: INTRAVENOUS | Status: DC

## 2021-01-31 MED ORDER — MAGNESIUM 250 MG PO TABS: 250.0000 mg | ORAL_TABLET | Freq: Every evening | ORAL | Status: DC | PRN

## 2021-01-31 NOTE — Plan of Care (Signed)
  Problem: Education: Goal: Knowledge of General Education information will improve Description: Including pain rating scale, medication(s)/side effects and non-pharmacologic comfort measures Outcome: Progressing   Problem: Education: Goal: Knowledge of disease or condition will improve Outcome: Progressing Goal: Knowledge of the prescribed therapeutic regimen will improve Outcome: Progressing   Problem: Activity: Goal: Risk for activity intolerance will decrease Outcome: Progressing   Problem: Cardiac: Goal: Will achieve and/or maintain hemodynamic stability Outcome: Progressing   Problem: Clinical Measurements: Goal: Postoperative complications will be avoided or minimized Outcome: Progressing   Problem: Respiratory: Goal: Respiratory status will improve Outcome: Progressing   Problem: Pain Management: Goal: Pain level will decrease Outcome: Progressing   Problem: Skin Integrity: Goal: Wound healing without signs and symptoms infection will improve Outcome: Progressing

## 2021-01-31 NOTE — Progress Notes (Signed)
Brief Nutrition Note  RD consulted to assist with diet advancement as pt has an alpha-gal allergy. Pt currently on a clear liquid diet with 75% meal completion charted. Discussed diet advancement with PA. Verbal order with readback for full liquid diet placed.  No nutrition interventions warranted at this time. If nutrition issues arise, please re-consult RD.    Gustavus Bryant, MS, RD, LDN Inpatient Clinical Dietitian Please see [facility] for contact information.

## 2021-01-31 NOTE — Op Note (Signed)
Jessica Wells, Jessica Wells MEDICAL RECORD NO: [id number] ACCOUNT NO: [id number] DATE OF BIRTH: 1946-11-12 FACILITY: [facility] LOCATION: [facility] PHYSICIAN: Revonda Standard. Roxan Hockey, MD  Operative Report   DATE OF PROCEDURE: 01/30/2021  PREOPERATIVE DIAGNOSIS:  Right upper lobe lung nodule.  POSTOPERATIVE DIAGNOSIS:  Non-small cell carcinoma, right upper lobe, clinical stage IA (T1, N0).  PROCEDURE:   Electromagnetic navigational bronchoscopy for tumor marking, Robotic right VATS, Right upper lobe posterior segmentectomy, Lymph node dissection, Intercostal nerve blocks levels 3 through 10.  SURGEON:  Revonda Standard. Roxan Hockey, MD  ASSISTANT:  Jadene Pierini, PA-C.  ANESTHESIA:  General.  FINDINGS:  Frozen section revealed adenocarcinoma.  Sites of adenomatous hyperplasia at initial margin.  Final margin benign.  CLINICAL NOTE:  Jessica Wells is a 75 year old woman with a history of breast cancer and a remote diagnosis of adenocarcinoma of the lung based on bronchial washings.  She has been followed with serial CTs and there was a small sub-solid nodule noted in the  right upper lobe in 2017.  Over time, it had gradually gotten larger and developed a small solid component.  On PET/CT, there was mild hypermetabolic activity with an SUV of 1.47.  She was offered the option of surgical resection for definitive  diagnosis and treatment.  The indications, risks, benefits, and alternatives were discussed in detail with the patient.  She understood and accepted the risks and agreed to proceed.  OPERATIVE NOTE:  Jessica Wells was brought to the preoperative holding area on 01/30/2021.  Anesthesia placed an arterial blood pressure monitoring line and a central venous catheter.  She was taken to the operating room and anesthetized and intubated.   Intravenous antibiotics were administered.  A Foley catheter was placed.  Sequential compression devices were placed on the calves for DVT prophylaxis.  Planning for the  navigational bronchoscopy was done on the console prior to induction.  A timeout was performed.  Flexible fiberoptic bronchoscopy was performed via the endotracheal tube.  It revealed normal endobronchial anatomy with no endobronchial lesions to the level of the subsegmental bronchi.  The locatable guide for navigation was  placed.  Registration was performed.  The bronchoscope was directed to the right upper lobe bronchus and the posterior segmental bronchus was cannulated.  The locatable guide was advanced towards the nodule. The closest approximation was approximately 1.5 cm.   Local registration was performed.  The catheter was manipulated to be within 1.5 cm of the nodule with good alignment.  An aspirating needle was inserted, was advanced while being visualized with fluoroscopy. 0.5 mL of a solution containing 50% ICG and 50% methylene blue  was injected.  The bronchoscope was removed.  The fluoroscopy time was less than a minute and the total dose was 9 milligrays.  Jessica Wells was then reintubated with a double lumen endotracheal tube.  This was difficult and took a prolonged period of time.  The patient's saturations did remain normal during this changeover.  Once the double-lumen endotracheal tube was placed, the  patient was placed in a left lateral decubitus position and the right chest was prepped and draped in the usual sterile fashion.  Single lung ventilation of the left lung was initiated and was tolerated well throughout the procedure.  A Bair Hugger was  placed for active warming and the right chest was prepped and draped in the usual sterile fashion.  A second timeout was performed.  Because of the patient's history of alpha-gal, a decision was made not to use liposomal bupivacaine. A solution  containing 30 mL of 0.5% bupivacaine and 50 mL of saline was prepared.  This was used for local at the  incision sites as well as the intercostal nerve blocks.  An incision was made in the  eighth interspace and an 8 mm port was placed and the thoracoscope was advanced into the chest.  There was good isolation of the right lung.  After confirming intrapleural  placement,  carbon dioxide was insufflated per protocol.  A 12 mm port was placed in the eighth interspace 5 cm anterior to the camera port.  Intercostal nerve blocks were performed from the 3rd to the 10th interspace, 8 mL of the solution was injected  into a subpleural plane at each level.  Two additional eighth interspace ports were placed, a 12-mm port was placed 5 cm posterior to the camera and an 8 mm port was placed 5 cm posterior to that.  A 12 mm AirSeal port was placed in the tenth interspace  posterolaterally.  The robotic instruments were inserted with thoracoscopic visualization.  The lung was retracted superiorly and the inferior pulmonary ligament was divided with bipolar cautery.  Level 8 and 9 nodes were removed.  All nodes that were encountered during the dissection were sent as separate specimens for permanent pathology.   The pleural reflection was divided at the hilum posteriorly and level 7 nodes were dissected out.  While dissecting out a large, but otherwise benign-appearing level 7 node, a large bronchial artery was divided and there was bleeding.  Pressure was used  to control the bleeding.  Ultimately, this vessel was cauterized with bipolar cautery and clipped multiple times. The first level 11 node was also dissected out at this point.  Next, the fissure was inspected and was relatively complete with just a thin  layer of pleura over the pulmonary artery at the confluence of the fissures.  The fissure was completed posteriorly with a single firing of the robotic stapler using a blue cartridge.  Additional level 11 nodes were removed.  Level 12 and 13 nodes were  also removed during the dissection.  The posterior vein branch had connecting veins from both the middle lobe and the superior segment of the lower  lobe.  These were divided with the SynchroSeal device.  The posterior segmental artery and vein then were  clearly visible.  The Firefly setting on the robotic console was used to identify the location of the nodule.  This was in the posterior segment, but directly adjacent to the anterior segment.  The decision was made to proceed with a posterior  segmentectomy with the possibility of needing to complete a lobectomy.  The posterior vein branch was encircled and divided with the robotic stapler using a gray cartridge.  Then, the posterior ascending pulmonary artery branch was dissected out and  divided with the white cartridge on the robotic stapler.  The right upper lobe bronchus now was clearly visible.  The dissection was carried out onto the bronchus past its bifurcation.  The anterior and apical segmental bronchi arose as a common trunk with  a posterior branch arising separately. The posterior division segmental branch was encircled.  The stapler was placed across this and a test inflation was performed again using Firefly.  The nodule was very near the demarcation of the anterior segment  from the posterior segment.  The stapler then was fired.  The segmentectomy was completed with sequential firings of the robotic stapler using both blue and green cartridges.  A 1  cm minimum gross  margin was maintained on the marked area as the segmentectomy was completed.  The specimen was placed into an endoscopic retrieval bag and removed through the posterior port incision.  The nodule was marked and then I accompanied the nodule to pathology for  cutting the specimen prior to the frozen section. The nodule was in fact present with a good 1 cm gross margin prior to the staples.  The frozen section returned showing an adenocarcinoma.  There was some adenomatous hyperplasia at the closest margin.   This was not known to be in continuity with the lesion, but a decision was made to resect additional margin.  This  was done performing additional wedge resections using the robotic stapler using both a blue cartridge and then a black cartridge, which was  brought across the parenchyma more centrally.  The specimen was removed, the new margin was marked and sent for frozen section.  It returned with no tumor seen.  The pleura was opened over the mediastinum superior to the azygos vein and posterior to  the superior vena cava, there was a 4R node that was removed and sent for permanent pathology.  The chest was copiously irrigated with warm saline.  A test inflation showed some air leakage from the parenchymal dissection.  There was no leakage from the  bronchial stump.  Progel was applied to the parenchyma.  There was a small area where the lung had torn with retraction and after the robot was undocked, this was stapled with a no-knife stapler.  The vessel loop and all sponges that had been placed were  removed.  Surgicel was left in the subcarinal space.  A 28-French Blake drain was placed through the anterior eighth interspace port incision and secured with a #1 silk suture.  Dual lung ventilation was resumed.  The remaining incisions were closed  with #1 Vicryl fascial sutures and 3-0 Vicryl subcuticular sutures.  Dermabond was applied.  Chest tube was placed to a Pleur-Evac on waterseal.  The patient was placed in the supine position.  She was extubated in the operating room and taken to the  [facility], extubated and in good condition.  All sponge, needle and instrument counts were correct at the end of the procedure.   PAA D: 01/30/2021 6:11:31 pm T: 01/31/2021 5:33:00 am  JOB: [id number]/ [id number]

## 2021-01-31 NOTE — Hospital Course (Addendum)
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  She has a remote history of smoking about a pack a day for 15 years prior to quitting in 1980.   In 2013 she had inflammatory breast cancer.  She was treated with chemo radiation and then had mastectomy.  She had a left mastectomy later as well.  She presented with respiratory failure during chemotherapy.  She was found to have DVT and PE.  She had a filter placed and was anticoagulated.  She was intubated and apparently bronchial washings were sent for cytology.  That showed adenocarcinoma lung primary.  There was no lung nodule apparent at that time.  She completed treatment for her breast cancer.   She has been followed since then with CTs.  She was first noted to have a small subsolid nodule in the right upper lobe in 2017.  Over time it has gradually gotten larger.  There is a small solid component.  In November her CT showed an increase in size of the nodule.  On PET CT, it was mildly hypermetabolic with an SUV of 1.61.   She remains active although her exercise somewhat limited by arthritis in her knees.  She is not having any chest pain, pressure, tightness, or shortness of breath.  She uses her Symbicort inhaler once a day.  She rarely if ever uses her albuterol inhaler.  She denies any change in appetite or weight loss.  No unusual headaches or visual changes.  Hospital Course:  On 01/30/2021 [name] underwent a robotic right VATs, right upper lobe posterior segmentectomy, and lymph node dissection with Dr. Roxan Hockey. She tolerated the procedure well and was transferred to the step down unit for continued care. She was doing well POD 1 and we encouraged use of incentive spirometer. She did have some atelectasis with a small space on CXR. We continued her chest tube  due to a small air leak. We encouraged ambulation around the unit.  The chest tube was able to be removed on postoperative day #2.  Vital signs are stable and she remains afebrile.  Renal function remains normal.  She does have an expected acute blood loss anemia which is stable.  Blood sugars have been under good control.  She does have a history of previous PE and DVT and is on Xarelto.

## 2021-01-31 NOTE — Progress Notes (Addendum)
[address]       [place] [address]             [phone]      1 Day Post-Op Procedure(s) (LRB): XI ROBOTIC ASSISTED THORACOSCOPY-POSTERIOR SEGMENTECTOMY RIGHT UPPER LOBE (Right) VIDEO BRONCHOSCOPY WITH ENDOBRONCHIAL NAVIGATION FOR TUMOR MARKING (N/A) NODE DISSECTION (Right) INTERCOSTAL NERVE BLOCK (Right) Subjective: Feels good this morning. Asking about her pathology.  Objective: Vital signs in last 24 hours: Temp:  [97.1 F (36.2 C)-98.6 F (37 C)] 97.9 F (36.6 C) (03/03 0400) Pulse Rate:  [48-76] 69 (03/03 0404) Cardiac Rhythm: Sinus bradycardia (03/03 0452) Resp:  [9-23] 17 (03/03 0454) BP: (106-165)/(54-90) 134/54 (03/03 0400) SpO2:  [85 %-100 %] 98 % (03/03 0404) Arterial Line BP: (162-176)/(65-114) 173/114 (03/02 1641)     Intake/Output from previous day: 03/02 0701 - 03/03 0700 In: 2998.2 [P.O.:120; I.V.:2628.2; IV Piggyback:100] Out: 3106 [Urine:2610; Blood:400; Chest Tube:96] Intake/Output this shift: No intake/output data recorded.  General appearance: alert, cooperative and no distress Heart: regular rate and rhythm, S1, S2 normal, no murmur, click, rub or gallop Lungs: rubs RLL Abdomen: soft, non-tender; bowel sounds normal; no masses,  no organomegaly Extremities: extremities normal, atraumatic, no cyanosis or edema Wound: clean and dry  Lab Results: Recent Labs    01/28/21 1318 01/30/21 1243 01/31/21 0110  WBC 5.7  --  9.5  HGB 12.9 10.2* 11.8*  HCT 37.3 30.0* 34.7*  PLT 166  --  137*   BMET:  Recent Labs    01/28/21 1318 01/30/21 1243 01/31/21 0110  NA 137 140 137  K 3.8 3.5 4.0  CL 104  --  103  CO2 23  --  25  GLUCOSE 169*  --  232*  BUN 23  --  13  CREATININE 0.82  --  0.94  CALCIUM 9.4  --  9.0    PT/INR:  Recent Labs    01/28/21 1318  LABPROT 12.5  INR 1.0   ABG    Component Value Date/Time   PHART 7.331 (L) 01/30/2021 1243   HCO3 24.1 01/30/2021 1243   TCO2 26 01/30/2021 1243    ACIDBASEDEF 2.0 01/30/2021 1243   O2SAT 94.0 01/30/2021 1243   CBG (last 3)  Recent Labs    01/28/21 1254 01/30/21 0642  GLUCAP 171* 138*    Assessment/Plan: S/P Procedure(s) (LRB): XI ROBOTIC ASSISTED THORACOSCOPY-POSTERIOR SEGMENTECTOMY RIGHT UPPER LOBE (Right) VIDEO BRONCHOSCOPY WITH ENDOBRONCHIAL NAVIGATION FOR TUMOR MARKING (N/A) NODE DISSECTION (Right) INTERCOSTAL NERVE BLOCK (Right)  1. Chest tube to water seal. 96cc/since surgery. CXR pending 2. Sinus bradycardia this morning, back in the 60s currently. BP well controlled, a little hypertensive yesterday evening, all home meds restarted. 3. Renal-creatinine 0.94, electrolytes okay 4. H and H stable 5. Endo- blood glucose well controlled,  6. Hx of PE and DVT, on xarelto  Plan: Will cut fluids in half as appetite slowly picks up. Encouraged to walk today in the hall. 1+ air leak on chest tube-keep today. Her pain has been well controlled-continue pain medication regimen. Weaning oxygen as tolerated.     LOS: 1 day    Jessica Wells 01/31/2021 Patient seen and examined, agree with above Denies nausea, pain well controlled Mobilize PACS system down currently, have not been able to review CXR yet  Jessica Lipps C. Roxan Hockey, MD [facility] [phone]  CXR reviewed. Small pneumo/ space. Some atelectasis in remaining RUL segments Mucinex + flutter  Jessica Lipps C. Roxan Hockey, MD  [facility] [phone]

## 2021-02-01 ENCOUNTER — Inpatient Hospital Stay (HOSPITAL_COMMUNITY): Payer: Medicare Other

## 2021-02-01 DIAGNOSIS — J4540 Moderate persistent asthma, uncomplicated: Secondary | ICD-10-CM | POA: Diagnosis not present

## 2021-02-01 DIAGNOSIS — Z853 Personal history of malignant neoplasm of breast: Secondary | ICD-10-CM | POA: Diagnosis not present

## 2021-02-01 DIAGNOSIS — M8580 Other specified disorders of bone density and structure, unspecified site: Secondary | ICD-10-CM | POA: Diagnosis not present

## 2021-02-01 DIAGNOSIS — E1169 Type 2 diabetes mellitus with other specified complication: Secondary | ICD-10-CM | POA: Diagnosis not present

## 2021-02-01 DIAGNOSIS — E7800 Pure hypercholesterolemia, unspecified: Secondary | ICD-10-CM | POA: Diagnosis not present

## 2021-02-01 DIAGNOSIS — C3491 Malignant neoplasm of unspecified part of right bronchus or lung: Secondary | ICD-10-CM | POA: Diagnosis not present

## 2021-02-01 DIAGNOSIS — E119 Type 2 diabetes mellitus without complications: Secondary | ICD-10-CM | POA: Diagnosis not present

## 2021-02-01 LAB — BASIC METABOLIC PANEL
Anion gap: 6 (ref 5–15)
BUN: 16 mg/dL (ref 8–23)
CO2: 25 mmol/L (ref 22–32)
Calcium: 8.6 mg/dL — ABNORMAL LOW (ref 8.9–10.3)
Chloride: 104 mmol/L (ref 98–111)
Creatinine, Ser: 0.87 mg/dL (ref 0.44–1.00)
GFR, Estimated: >60 mL/min (ref >60)
Glucose, Bld: 127 mg/dL — ABNORMAL HIGH (ref 70–99)
Potassium: 3.7 mmol/L (ref 3.5–5.1)
Sodium: 135 mmol/L (ref 135–145)

## 2021-02-01 LAB — CBC
HCT: 29.1 % — ABNORMAL LOW (ref 36.0–46.0)
Hemoglobin: 10.2 g/dL — ABNORMAL LOW (ref 12.0–15.0)
MCH: 32.1 pg (ref 26.0–34.0)
MCHC: 35.1 g/dL (ref 30.0–36.0)
MCV: 91.5 fL (ref 80.0–100.0)
Platelets: 125 10*3/uL — ABNORMAL LOW (ref 150–400)
RBC: 3.18 MIL/uL — ABNORMAL LOW (ref 3.87–5.11)
RDW: 12.1 % (ref 11.5–15.5)
WBC: 8.3 10*3/uL (ref 4.0–10.5)
nRBC: 0 % (ref 0.0–0.2)

## 2021-02-01 LAB — SURGICAL PATHOLOGY

## 2021-02-01 MED ORDER — MENTHOL 3 MG MT LOZG: 1.0000 | LOZENGE | OROMUCOSAL | Status: DC | PRN

## 2021-02-01 NOTE — Discharge Instructions (Signed)
[facility] office [phone]   Robot-Assisted Thoracic Surgery  Robot-assisted thoracic surgery is a procedure in which robotic arms and surgical instruments are used to perform complex procedures through small incisions. During surgery, the surgeon sits at a console inside of the operating room and uses controls at the console to move the robotic arms. Surgical instruments are attached to the ends of the robotic arms. Instruments include a tool with a light and camera on the end of it (thoracoscope). The camera sends images to a video monitor that your surgeon will use to view the inside of your thoracic area. The thoracic area is between the neck and abdomen. Robot-assisted thoracic surgery may be done to:  Remove a tissue sample to be tested in a lab (biopsy).  Remove a part of a lung (lobectomy) or the whole lung (pneumonectomy).  Remove tumors.  Treat other conditions that affect: ? Your esophagus. This is the part of your body that carries food and liquid from your mouth to your stomach. ? Your diaphragm. This is a muscle wall between your lungs and stomach area. It helps with breathing.  Remove a portion of the nerves (sympathectomy) that cause excessive sweating. Tell a health care provider about:  Any allergies you have. This is especially important if you have allergies to medicines or sedatives.  All medicines you are taking, including vitamins, herbs, eye drops, creams, and over-the-counter medicines.  Any problems you or family members have had with anesthetic medicines.  Any blood disorders you have.  Any surgeries you have had.  Any medical conditions you have.  Whether you are pregnant or may be pregnant. What are the risks? Generally, this is a safe procedure. However, problems may occur, including:  Infection, such as pneumonia.  Severe bleeding (hemorrhage).  Allergic reactions to medicines.  Damage to organs or structures such as nerves or blood  vessels. What happens before the procedure? Staying hydrated Follow instructions from your health care provider about hydration, which may include:  Up to 2 hours before the procedure - you may continue to drink clear liquids, such as water, clear fruit juice, black coffee, and plain tea. Eating and drinking restrictions Follow instructions from your health care provider about eating and drinking, which may include:  8 hours before the procedure - stop eating heavy meals or foods, such as meat, fried foods, or fatty foods.  6 hours before the procedure - stop eating light meals or foods, such as toast or cereal.  6 hours before the procedure - stop drinking milk or drinks that contain milk.  2 hours before the procedure - stop drinking clear liquids. Medicines  Ask your health care provider about: ? Changing or stopping your regular medicines. This is especially important if you are taking diabetes medicines or blood thinners. ? Taking medicines such as aspirin and ibuprofen. These medicines can thin your blood. Do not take these medicines unless your health care provider tells you to take them. ? Taking over-the-counter medicines, vitamins, herbs, and supplements.  Talk with your health care provider about safe and effective ways to manage pain before and after your procedure. Pain management should fit your specific health needs. Tests  You may have tests, such as: ? CT scan. ? Ultrasound. ? Chest X-ray. ? Electrocardiogram (ECG).  You may have a blood or urine sample taken. General instructions  Ask your health care provider: ? How your surgery site will be marked. ? What steps will be taken to help prevent  infection. These steps may include:  Removing hair at the surgery site.  Washing skin with a germ-killing soap.  Receiving antibiotic medicine.  Do not use any products that contain nicotine or tobacco for at least 4 weeks before the procedure. These products  include cigarettes, chewing tobacco, and vaping devices, such as e-cigarettes. If you need help quitting, ask your health care provider.  Plan to have a responsible adult take you home from the hospital or clinic.  Plan to have a responsible adult care for you for the time you are told after you leave the hospital or clinic. This is important. What happens during the procedure?  An IV will be inserted into one of your veins.  You will be given one or more of the following: ? A medicine to help you relax (sedative). ? A medicine to make you fall asleep (general anesthetic). ? A medicine that is injected into an area of your body to numb everything below the injection site (regional anesthetic).  One to four small incisions will be made. The number of incisions and the area where incisions are made will depend on the purpose of your procedure.  A surgical assistant will then place instruments that are attached to the robotic arms into your thoracic cavity through these small incisions. The robotic arms are equipped with different surgical instruments and a high-definition 3D camera.  The surgeon will sit at a control console and see a magnified, high-resolution 3D image of the surgical field on a monitor. The surgeon will use master controls from the console that respond to the surgeon's movements in real time.  The computer-assisted robot will translate the surgeon's hand, wrist, or finger movements into precise movements of the four robotic arms so that the necessary procedures can be performed.  A drainage tube may be placed to drain excess fluid from the chest cavity.  Your incisions will be closed with stitches (sutures) or staples and covered with a bandage (dressing). The procedure may vary among health care providers and hospitals. What happens after the procedure?  Your blood pressure, heart rate, breathing rate, and blood oxygen level will be monitored until you leave the hospital  or clinic.  You may have a drainage tube in place. It may stay in place for a few days after the procedure to monitor for signs of air or fluid buildup in the chest cavity. Summary  Robot-assisted thoracic surgery is a procedure in which robotic arms and surgical instruments are used to help perform complex procedures through small incisions. During surgery, the surgeon sits at a console in the operating room and uses controls at the console to move the robotic arms.  A drainage tube may be placed to drain excess fluid from the chest cavity. The tube will be closely monitored for fluid or air buildup in the chest cavity. This information is not intended to replace advice given to you by your health care provider. Make sure you discuss any questions you have with your health care provider. Document Revised: 08/13/2020 Document Reviewed: 08/13/2020 Elsevier Patient Education  2021 [employer].

## 2021-02-01 NOTE — Plan of Care (Signed)
  Problem: Education: Goal: Knowledge of General Education information will improve Description: Including pain rating scale, medication(s)/side effects and non-pharmacologic comfort measures Outcome: Progressing   Problem: Education: Goal: Knowledge of disease or condition will improve Outcome: Progressing Goal: Knowledge of the prescribed therapeutic regimen will improve Outcome: Progressing   Problem: Activity: Goal: Risk for activity intolerance will decrease Outcome: Progressing   Problem: Cardiac: Goal: Will achieve and/or maintain hemodynamic stability Outcome: Progressing   Problem: Clinical Measurements: Goal: Postoperative complications will be avoided or minimized Outcome: Progressing   Problem: Respiratory: Goal: Respiratory status will improve Outcome: Progressing   Problem: Pain Management: Goal: Pain level will decrease Outcome: Progressing   Problem: Skin Integrity: Goal: Wound healing without signs and symptoms infection will improve Outcome: Progressing

## 2021-02-01 NOTE — Progress Notes (Addendum)
      [address]       [place] [address]             [phone]      2 Days Post-Op Procedure(s) (LRB): XI ROBOTIC ASSISTED THORACOSCOPY-POSTERIOR SEGMENTECTOMY RIGHT UPPER LOBE (Right) VIDEO BRONCHOSCOPY WITH ENDOBRONCHIAL NAVIGATION FOR TUMOR MARKING (N/A) NODE DISSECTION (Right) INTERCOSTAL NERVE BLOCK (Right) Subjective: Feels good this morning. No issues overnight.   Objective: Vital signs in last 24 hours: Temp:  [97.7 F (36.5 C)-98.8 F (37.1 C)] 98.5 F (36.9 C) (03/04 0428) Pulse Rate:  [48-66] 66 (03/04 0428) Cardiac Rhythm: Normal sinus rhythm;Sinus bradycardia (03/04 0426) Resp:  [14-20] 18 (03/04 0428) BP: (97-126)/(52-62) 117/58 (03/04 0428) SpO2:  [94 %-99 %] 94 % (03/04 0428)     Intake/Output from previous day: 03/03 0701 - 03/04 0700 In: 958.5 [I.V.:958.5] Out: 6144 [Urine:1131; Chest Tube:222] Intake/Output this shift: Total I/O In: 446.9 [I.V.:446.9] Out: 110 [Chest Tube:110]  General appearance: alert, cooperative and no distress Heart: regular rate and rhythm, S1, S2 normal, no murmur, click, rub or gallop Lungs: clear to auscultation bilaterally Abdomen: soft, non-tender; bowel sounds normal; no masses,  no organomegaly Extremities: extremities normal, atraumatic, no cyanosis or edema Wound: clean and dry  Lab Results: Recent Labs    01/31/21 0110 02/01/21 0150  WBC 9.5 8.3  HGB 11.8* 10.2*  HCT 34.7* 29.1*  PLT 137* 125*   BMET:  Recent Labs    01/31/21 0110 02/01/21 0150  NA 137 135  K 4.0 3.7  CL 103 104  CO2 25 25  GLUCOSE 232* 127*  BUN 13 16  CREATININE 0.94 0.87  CALCIUM 9.0 8.6*    PT/INR: No results for input(s): LABPROT, INR in the last 72 hours. ABG    Component Value Date/Time   PHART 7.331 (L) 01/30/2021 1243   HCO3 24.1 01/30/2021 1243   TCO2 26 01/30/2021 1243   ACIDBASEDEF 2.0 01/30/2021 1243   O2SAT 94.0 01/30/2021 1243   CBG (last 3)  Recent Labs    01/30/21 0642  GLUCAP  138*    Assessment/Plan: S/P Procedure(s) (LRB): XI ROBOTIC ASSISTED THORACOSCOPY-POSTERIOR SEGMENTECTOMY RIGHT UPPER LOBE (Right) VIDEO BRONCHOSCOPY WITH ENDOBRONCHIAL NAVIGATION FOR TUMOR MARKING (N/A) NODE DISSECTION (Right) INTERCOSTAL NERVE BLOCK (Right)  1. Chest tube to water seal. 222cc/24 hours. CXR with atelectasis but overall improving.  2. Sinus bradycardia this morning, back in the 60s currently. BP well controlled, home meds re-started 3. Renal-creatinine 0.87, electrolytes okay 4. H and H stable 10.2/29.1 5. Endo- blood glucose well controlled,  6. Hx of PE and DVT, on xarelto  Plan: No air leak, + fluid wave. CXR improved today. She walked around the unit without issue. Can maybe remove the chest tube today, if so discharge tomorrow.    LOS: 2 days    Elgie Collard 02/01/2021 Patient seen and examined, agree with above Dc chest tube Advance diet Continue ambulation and pulmonary toilet Hopefully home tomorrow  Revonda Standard. Roxan Hockey, MD [facility] [phone]

## 2021-02-01 NOTE — Discharge Summary (Addendum)
Physician Discharge Summary  Patient ID: Jessica Wells MRN: [id number] DOB/AGE: 1945-12-09 75 y.o.  Admit date: 01/30/2021 Discharge date: 02/02/2021  Admission Diagnoses:  Patient Active Problem List   Diagnosis Date Noted  . Adenocarcinoma of lung, stage 1, right ([place]) 02/02/2021  . Seasonal and perennial allergic rhinitis 09/17/2017  . S/P mastectomy 05/10/2014  . Cancer of central portion of right female breast ([place]) 09/23/2013  . Pulmonary nodule 01/03/2013  . Acute respiratory failure with hypoxia ([place]) 12/29/2012  . Hypokalemia 12/29/2012  . Pulmonary embolism ([place]) 12/28/2012  . Healthcare-associated pneumonia 12/27/2012  . Acute respiratory distress 12/27/2012  . Asthma   . GERD (gastroesophageal reflux disease)   . Dysrhythmia   . Allergy   . Arthritis   . Clotting disorder ([place])   . History of chemotherapy   . DVT (deep venous thrombosis) ([place])   . DVT of lower extremity, bilateral ([place]) 12/22/2012  . PSVT (paroxysmal supraventricular tachycardia) ([place]) 10/14/2012  . PONV (postoperative nausea and vomiting) 09/13/2012  . 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  She has a remote history of smoking about a pack a day for 15 years prior to quitting in 1980.   In 2013 she had inflammatory breast cancer.  She was treated with chemo radiation and then had mastectomy.  She had a left mastectomy later as well.  She presented with respiratory failure during chemotherapy.  She was found to have DVT and PE.  She had a filter placed and was anticoagulated.  She was intubated and apparently bronchial washings were sent for cytology.  That  showed adenocarcinoma lung primary.  There was no lung nodule apparent at that time.  She completed treatment for her breast cancer.   She has been followed since then with CTs.  She was first noted to have a small subsolid nodule in the right upper lobe in 2017.  Over time it has gradually gotten larger.  There is a small solid component.  In November her CT showed an increase in size of the nodule.  On PET CT, it was mildly hypermetabolic with an SUV of 4.58.   She remains active although her exercise somewhat limited by arthritis in her knees.  She is not having any chest pain, pressure, tightness, or shortness of breath.  She uses her Symbicort inhaler once a day.  She rarely if ever uses her albuterol inhaler.  She denies any change in appetite or weight loss.  No unusual headaches or visual changes.  Hospital Course:  On 01/30/2021 [name] underwent a robotic right VATs, right upper lobe posterior segmentectomy, and lymph node dissection with Dr. Roxan Hockey. She tolerated the procedure well and was transferred to the step down unit for continued care. She was doing well POD 1 and we encouraged use of incentive spirometer. She did have some atelectasis with a small space on CXR. We continued her chest tube due to a small air leak. We encouraged ambulation around the unit. POD 2 she did not have an air leak with her chest tube to water seal. We discontinued her chest tube on 3/4, follow-up chest xray showed stable small right apical pneumothorax. . Today, she is  ambulating with limited assistance, her incisions are healing well, she is tolerating room air with good oxygen saturation and she is ready for discharge home. Her son lives next door to her and plans to assist during the post-op period.    Consults: None  Significant Diagnostic Studies:  Surgical pathology is pending.   Follow-up CXR showed:    IMPRESSION: Stable small right apical pneumothorax. Right-sided chest tube  has been removed. Stable left basilar subsegmental atelectasis. Persistent postoperative changes are noted in the right upper lobe.   Treatments:    NAME: Jessica Wells MEDICAL RECORD NO: [id number] ACCOUNT NO: [id number] DATE OF BIRTH: 15-May-1946 FACILITY: [facility] LOCATION: [facility] PHYSICIAN: Revonda Standard. Roxan Hockey, MD  Operative Report   DATE OF PROCEDURE: 01/30/2021  PREOPERATIVE DIAGNOSIS:  Right upper lobe lung nodule.  POSTOPERATIVE DIAGNOSIS:  Non-small cell carcinoma, right upper lobe, clinical stage IA (T1, N0).  PROCEDURE:  Electromagnetic navigational bronchoscopy for tumor marking, robotic right VATS, right upper lobe posterior segmentectomy, lymph node dissection, intercostal nerve blocks levels 3 through 10.  SURGEON:  Revonda Standard. Roxan Hockey, MD  ASSISTANT:  [place]:  General.  FINDINGS:  Frozen section revealed adenocarcinoma, sites of adenomatous hyperplasia at initial margin, final margin benign.   Discharge Exam: Blood pressure 131/67, pulse 74, temperature 98.2 F (36.8 C), temperature source Oral, resp. rate (!) 23, height 5\' 1"  (1.549 m), weight 64.9 kg, SpO2 98 %.    General appearance: alert, cooperative and no distress Lungs: clear to auscultation bilaterally Wound: clean and dry   2 d ago   SURGICAL PATHOLOGY SURGICAL PATHOLOGY  CASE: [id number]  PATIENT: Jessica Wells  Surgical Pathology Report      Clinical History: right upper lobe lung nodule (cm)      FINAL MICROSCOPIC DIAGNOSIS:   A. LUNG, RIGHT UPPER LOBE POSTERIOR SEGMENTECTOMY:  - Invasive well to moderately differentiated adenocarcinoma, 0.8 cm,  with lepidic and acinar patterns  - Visceral pleura is not involved  - Resection edge shows atypical pneumocytes in a fibrotic stroma,  suggestive but not definitive of focal involvement by adenocarcinoma,  see comment  - Negative for lymphovascular invasion  - See oncology table   B. LUNG, RIGHT  UPPER LOBE POSTERIOR SEGMENT, NEW MARGIN, WEDGE BIOPSY:  - Benign lung parenchyma, negative for carcinoma   C. LYMPH NODE, 8, EXCISION:  - Lymph node, negative for carcinoma (0/1)   D. LYMPH NODE, 9, EXCISION:  - Lymph node, negative for carcinoma (0/1)   E. LYMPH NODE, 9 #2, EXCISION:  - Lymph node, negative for carcinoma (0/1)   F. LYMPH NODE, 7, EXCISION:  - Lymph node, negative for carcinoma (0/1)   G. LYMPH NODE, 10, EXCISION:  - Lymph node, negative for carcinoma (0/1)   H. LYMPH NODE, 11, EXCISION:  - Lymph node, negative for carcinoma (0/1)   I. LYMPH NODE, 12, EXCISION:  - Lymph node, negative for carcinoma (0/1)   J. LYMPH NODE, 11 #2, EXCISION:  - Lymph node, negative for carcinoma (0/1)   K. LYMPH NODE, 13, EXCISION:  - Lymph node, negative for carcinoma (0/1)   L. LYMPH NODE, 13 #2, EXCISION:  - Lymph node, negative for carcinoma (0/1)   M. LYMPH NODE, 4R, EXCISION:  - Lymph node, negative for carcinoma (0/1)     COMMENT:   A. Most of the atypical lesion is present on the frozen section slide  with only focal residual atypical glands present in the permanent  section. Also, please note  that the final resection margin is  represented by part B which is negative for carcinoma.     ONCOLOGY TABLE:   LUNG: Resection   Synchronous Tumors: Not applicable  Total Number of Primary Tumors: 1  Procedure: Segmentectomy  Specimen Laterality: Right  Tumor Focality: Unifocal  Tumor Site: Upper lobe posterior segment  Tumor Size:    Total Tumor Size: 0.8 cm    Invasive Tumor Size (applies only to invasive nonmucinous  adenocarcinoma with a lepidic       component): 0.4 cm  Histologic Type: Adenocarcinoma  Visceral Pleura Invasion: Not identified  Direct Invasion of Adjacent Structures: No adjacent structures present  Lymphovascular Invasion: Not identified  Margins: All margins negative for invasive carcinoma    Closest Margin(s) to  Invasive Carcinoma: Stapled parenchymal margin    Margin(s) Involved by Invasive Carcinoma: Not applicable     Margin Status for Non-Invasive Tumor: Not applicable  Treatment Effect: No known presurgical therapy  Regional Lymph Nodes:    Number of Lymph Nodes Involved: 0    Number of Lymph Nodes Examined: 11            Nodal Sites Examined: 4R, 7, 8, 9, 10, 11, 12, 13  Distant Metastasis:    Distant Site(s) Involved: Not applicable  Pathologic Stage Classification (pTNM, AJCC 8th Edition): pT23mi, pN0  Ancillary Studies: Can be performed upon request  Representative Tumor Block: A4    (v4.2.0.1)      INTRAOPERATIVE DIAGNOSIS:   A1. LUNG, RIGHT UPPER LOBE POSTERIOR SEGMENT MARGIN, FROZEN SECTION:     Margin suspicious for at least atypical adenomatous hyperplasia.     Rapid intraoperative consult diagnosis rendered by Dr. Saralyn Pilar @  1346 01/30/2021.   A2. LUNG, RIGHT UPPER LOBE POSTERIOR SEGMENT MASS, FROZEN SECTION:     Adenocarcinoma.     Rapid intraoperative consult diagnosis rendered by Dr. Saralyn Pilar @  1346 01/30/2021.    B1. LUNG, RIGHT UPPER LOBE POSTERIOR SEGMENT, NEW MARGIN, FROZEN  SECTION:     Benign.     Rapid intraoperative consult diagnosis rendered by Dr. Saralyn Pilar @  1414 01/30/2021.       GROSS DESCRIPTION:   Specimen A: Lung, right upper lobe posterior segmentectomy, submitted  fresh for rapid intraoperative consult evaluation of margin and lesion  by frozen section.  Specimen integrity: Intact butfor a staple line along one aspect.  Size, weight: 57 g, 10.8 x 6.5 x 3.2 cm  Pleura: Pink to red-purple, smooth, with scattered anthracosis.  Lesion: 0.8 x 0.8 x 0.7 cm gray-white firm ill-defined mass which does  not grossly involve pleura.  Margin(s): The mass is 1.3 cm from the staple line.  Nonneoplastic parenchyma: Pink-red, slightly anthracotic, spongy.  Block Summary:  Block 1 = tissue adjacent to the area of  staple line nearest the mass  submitted for frozen section.  Block 2 = section of mass, submitted for frozen section.  Blocks 3, 4 = remainder of mass, submitted for routine histology.  Block 5 = random parenchyma away from mass   Specimen B: Received fresh for rapid intraoperative consult evaluation  of new margin by frozen section is a 3 g, 3.4 x 2.4 x 1.5 cm wedge of  lung, clinically right upper lobe posterior segment, with sutures on one  staple line, identifying the new margin. The pleura is pink to  red-purple, smooth, and cut surfaces have tan-pink to red-A. purple  spongy parenchyma with no discrete lesions or masses. Tissue adjacent  to  the staple line identified as new margin is submitted in 1 block for  frozen section.   Specimen C: Received fresh labeled level 8 node is a 0.9 x 0.6 x 0.25 cm  soft anthracotic nodule, in toto 1 block.   Specimen D: Received fresh labeled level 9 node is a 1 cm soft  anthracotic nodule, bisected and submitted in 1 block.   Specimen E: Received fresh labeled level 9 node #2 is a 0.3 cm soft  anthracotic nodule, in toto 1 block.   Specimen F: Received fresh labeled level 7 node is a 1.6 cm tan-pink to  anthracotic soft to rubbery nodule with adherent dark red soft material.  Sectioned and entirely submitted 1 block.   Specimen G: Received fresh labeled level 10 node is a 0.7 x 0.5 x 0.25  cm soft anthracotic nodule, in toto 1 block.   Specimen H: Received fresh labeled level 11 node is a 1.1 x 0.5 x 0.2 cm  yellow-red to anthracotic soft tissue, in toto 1 block.   Specimen I: Received fresh labeled level 12 node is a 0.8 x 0.6 x 0.1 cm  aggregate of anthracotic soft tissue/material, submitted 1 block.   Specimen J: Received fresh labeled level 11 node #2 is a 1 cm soft to  rubbery anthracotic nodule, bisected and submitted in 1 block.   Specimen K: Received fresh labeled level 13 node is a 1.4 x 0.7 x 0.2 cm  soft anthracotic  nodule, in toto 1 block.   Specimen L: Received fresh labeled level 13 node #2 is a 1.2 x 0.5 x  0.25 cm soft anthracotic nodule, in toto 1 block.   Specimen M: Received fresh labeled 4 R node is a 0.7 x 0.6 x 0.1 cm  pink-gray soft nodule, in toto 1 block.   SW 01/31/2021     Final Diagnosis performed by Jaquita Folds, MD        Disposition: Discharged to home in stable condition.   Allergies as of 02/02/2021      Reactions   Alpha-gal Hives, Itching, Rash, Shortness Of Breath   Anesthetics, Amide Nausea And Vomiting   Projectile vomitting-Nausea- with 24hrs of dry heaves. With any anesthetics   Bactrim [sulfamethoxazole-trimethoprim] Other (See Comments)   Massive diarrhea, nausea vomiting, platelets dropped, hemoglobin dropped, admitted to hospital   Sulfa Antibiotics Nausea And Vomiting, Other (See Comments)   Crazy feeling in head Life threatening reaction, decreased blood counts   Codeine Nausea And Vomiting   Codeine Phosphate Nausea And Vomiting   Eggs Or Egg-derived Products Hives   Fish Allergy    Milk-related Compounds     Upset stomach    Other    All tree nuts itching,sneezing      Medication List    TAKE these medications   albuterol 108 (90 Base) MCG/ACT inhaler Commonly known as: VENTOLIN HFA INHALE 4 PUFFS BY MOUTH EVERY 4 TO 6 HOURS AS NEEDED FOR COUGH AND FOR WHEEZING .USE  WITH  SPACER.   atorvastatin 10 MG tablet Commonly known as: LIPITOR Take 10 mg by mouth daily.   calcium-vitamin D 500-200 MG-UNIT tablet Commonly known as: OSCAL WITH D Take 1 tablet by mouth every other day.   EPINEPHrine 0.3 mg/0.3 mL Soaj injection Commonly known as: EPI-PEN Use as directed for severe allergic reaction   fluticasone 50 MCG/ACT nasal spray Commonly known as: FLONASE Place 1 spray into both nostrils as needed for allergies or rhinitis.   glucosamine-chondroitin 500-400  MG tablet Take 1 tablet by mouth at bedtime.   lisinopril 2.5 MG  tablet Commonly known as: ZESTRIL Take 2.5 mg by mouth every evening.   Magnesium 250 MG Tabs Take 250 mg by mouth at bedtime as needed (leg cramps).   metoprolol succinate 50 MG 24 hr tablet Commonly known as: TOPROL-XL Take 1 tablet (50 mg total) by mouth daily. Take with or immediately following a meal. What changed: additional instructions   multivitamin with minerals Tabs tablet Take 1 tablet by mouth every other day.   omeprazole 20 MG capsule Commonly known as: PRILOSEC Take 1 capsule (20 mg total) by mouth daily. For heartburn   SUPER B COMPLEX PO Take 1 tablet by mouth at bedtime.   Symbicort 160-4.5 MCG/ACT inhaler Generic drug: budesonide-formoterol INHALE 2 PUFFS BY MOUTH TWICE DAILY .USE  WITH  SPACER. What changed: See the new instructions.   traMADol 50 MG tablet Commonly known as: ULTRAM Take 1 tablet (50 mg total) by mouth every 6 (six) hours as needed for up to 7 days (mild pain).   Xarelto 20 MG Tabs tablet Generic drug: rivaroxaban TAKE 1 TABLET BY MOUTH ONCE DAILY WITH SUPPER What changed: how much to take       Follow-up Information    Leighton Ruff, MD. Call in 1 day(s).   Specialty: Family Medicine Contact information: [place] [address] [phone]        Jerline Pain, MD .   Specialty: Cardiology Contact information: [id number] [place] [address] [phone]        [name], MD Follow up.   Specialty: Cardiothoracic Surgery Why: Please see discharge paperwork for follow-up appointment with Dr. Roxan Hockey.  Obtain a chest x-ray at [facility].  IT IS LOCATED IN THE SAME OFFICE COMPLEX. Contact information: [address] [place] [address] [phone]               Signed: Antony Odea, PA-C 02/02/2021, 11:11 AM

## 2021-02-01 NOTE — Care Management Important Message (Signed)
Important Message  Patient Details  Name: Jessica Wells MRN: [id number] Date of Birth: 21-Apr-1946   Medicare Important Message Given:  Yes     Orbie Pyo 02/01/2021, 2:40 PM

## 2021-02-01 NOTE — Progress Notes (Signed)
Patient's chest tube was pulled at 1015, tolerated well. Steri-Strips applied to site. Gauze applied to dress.

## 2021-02-02 ENCOUNTER — Inpatient Hospital Stay (HOSPITAL_COMMUNITY): Payer: Medicare Other

## 2021-02-02 DIAGNOSIS — C3491 Malignant neoplasm of unspecified part of right bronchus or lung: Secondary | ICD-10-CM

## 2021-02-02 MED ORDER — TRAMADOL HCL 50 MG PO TABS: 50.0000 mg | ORAL_TABLET | Freq: Four times a day (QID) | ORAL | 0 refills | Status: AC | PRN

## 2021-02-02 MED ORDER — RIVAROXABAN 20 MG PO TABS: 20.0000 mg | ORAL_TABLET | Freq: Every day | ORAL | Status: DC

## 2021-02-02 MED ORDER — METOPROLOL SUCCINATE ER 50 MG PO TB24: 50.0000 mg | ORAL_TABLET | Freq: Every day | ORAL | Status: DC

## 2021-02-02 NOTE — Progress Notes (Signed)
3 Days Post-Op Procedure(s) (LRB): XI ROBOTIC ASSISTED THORACOSCOPY-POSTERIOR SEGMENTECTOMY RIGHT UPPER LOBE (Right) VIDEO BRONCHOSCOPY WITH ENDOBRONCHIAL NAVIGATION FOR TUMOR MARKING (N/A) NODE DISSECTION (Right) INTERCOSTAL NERVE BLOCK (Right) Subjective: No complaints  Objective: Vital signs in last 24 hours: Temp:  [97.9 F (36.6 C)-98.5 F (36.9 C)] 98 F (36.7 C) (03/05 0806) Pulse Rate:  [67-95] 80 (03/05 0806) Cardiac Rhythm: Normal sinus rhythm (03/05 0827) Resp:  [18-22] 18 (03/05 0806) BP: (108-133)/(52-67) 118/67 (03/05 0806) SpO2:  [94 %-98 %] 98 % (03/05 0806)  Hemodynamic parameters for last 24 hours:    Intake/Output from previous day: 03/04 0701 - 03/05 0700 In: 480 [P.O.:480] Out: 70 [Chest Tube:70] Intake/Output this shift: Total I/O In: 360 [P.O.:360] Out: -   General appearance: alert, cooperative and no distress Lungs: clear to auscultation bilaterally Wound: clean and dry  Lab Results: Recent Labs    01/31/21 0110 02/01/21 0150  WBC 9.5 8.3  HGB 11.8* 10.2*  HCT 34.7* 29.1*  PLT 137* 125*   BMET:  Recent Labs    01/31/21 0110 02/01/21 0150  NA 137 135  K 4.0 3.7  CL 103 104  CO2 25 25  GLUCOSE 232* 127*  BUN 13 16  CREATININE 0.94 0.87  CALCIUM 9.0 8.6*    PT/INR: No results for input(s): LABPROT, INR in the last 72 hours. ABG    Component Value Date/Time   PHART 7.331 (L) 01/30/2021 1243   HCO3 24.1 01/30/2021 1243   TCO2 26 01/30/2021 1243   ACIDBASEDEF 2.0 01/30/2021 1243   O2SAT 94.0 01/30/2021 1243   CBG (last 3)  No results for input(s): GLUCAP in the last 72 hours.  Assessment/Plan: S/P Procedure(s) (LRB): XI ROBOTIC ASSISTED THORACOSCOPY-POSTERIOR SEGMENTECTOMY RIGHT UPPER LOBE (Right) VIDEO BRONCHOSCOPY WITH ENDOBRONCHIAL NAVIGATION FOR TUMOR MARKING (N/A) NODE DISSECTION (Right) INTERCOSTAL NERVE BLOCK (Right) -Doing well Dc home today Path T1N0 stage IA adenocarcinoma Resume normal Xarelto  dose  Jessica Lipps C. Roxan Hockey, MD [facility] [phone]    LOS: 3 days    Jessica Wells 02/02/2021

## 2021-02-03 LAB — BPAM RBC
Blood Product Expiration Date: 202203252359
Blood Product Expiration Date: 202203252359
Blood Product Expiration Date: 202203252359
Blood Product Expiration Date: 202203252359
ISSUE DATE / TIME: 202203020905
ISSUE DATE / TIME: 202203020905
Unit Type and Rh: 6200
Unit Type and Rh: 6200
Unit Type and Rh: 6200
Unit Type and Rh: 6200

## 2021-02-03 LAB — TYPE AND SCREEN
ABO/RH(D): A POS
Antibody Screen: NEGATIVE
Unit division: 0
Unit division: 0
Unit division: 0
Unit division: 0

## 2021-02-07 ENCOUNTER — Other Ambulatory Visit: Payer: Self-pay | Admitting: *Deleted

## 2021-02-07 NOTE — Progress Notes (Signed)
The proposed treatment discussed in cancer conference is for discussion purpose only and is not a binding recommendation. The patient was not physically examined nor present for their treatment options. Therefore, final treatment plans cannot be decided.  ?

## 2021-02-18 ENCOUNTER — Other Ambulatory Visit: Payer: Self-pay | Admitting: Thoracic Surgery (Cardiothoracic Vascular Surgery)

## 2021-02-18 ENCOUNTER — Other Ambulatory Visit: Payer: Self-pay

## 2021-02-18 ENCOUNTER — Ambulatory Visit (INDEPENDENT_AMBULATORY_CARE_PROVIDER_SITE_OTHER): Payer: Medicare Other | Admitting: Emergency Medicine

## 2021-02-18 ENCOUNTER — Encounter: Payer: Self-pay | Admitting: Emergency Medicine

## 2021-02-18 DIAGNOSIS — R911 Solitary pulmonary nodule: Secondary | ICD-10-CM

## 2021-02-18 DIAGNOSIS — J302 Other seasonal allergic rhinitis: Secondary | ICD-10-CM

## 2021-02-18 DIAGNOSIS — J3089 Other allergic rhinitis: Secondary | ICD-10-CM | POA: Diagnosis not present

## 2021-02-18 DIAGNOSIS — C3491 Malignant neoplasm of unspecified part of right bronchus or lung: Secondary | ICD-10-CM

## 2021-02-18 DIAGNOSIS — J4520 Mild intermittent asthma, uncomplicated: Secondary | ICD-10-CM

## 2021-02-18 NOTE — Patient Instructions (Addendum)
You may want to try stopping your Symbicort to see if you miss it. Keep track of whether your breathing mises, whether you need to use your albuterol. Keep albuterol available to use 2 puffs up to every 4 hours if needed for shortness of breath, chest tightness, wheezing.  Keep your epi-pen available as well.  Take your flonase 1 spray each nostril daily during the allergy season.  Discuss with Dr Roxan Hockey the frequency with which we need to follow your CT chest.  Follow with Dr Lamonte Sakai in 6 months or sooner if you have any problems

## 2021-02-18 NOTE — Progress Notes (Signed)
Subjective:    Patient ID: Jessica Wells, female    DOB: 1946-04-22, 75 y.o.   MRN: [id number]  HPI 75 year old former smoker (15 pack years) with a history of heterozygous prothrombin 2 mutation with a DVT and pulmonary embolism on anticoagulation.  Also with a history of right breast cancer (mastectomy, neoadjuvant chemo, XRT), atrial tachycardia, GERD with hiatal hernia. Also with possible hx adenoCA lung - seen on a BAL when she was admitted with B infiltrates in 2014.   She is followed by Dr. Lindi Adie and has been having serial CT scans of the chest to follow a small mixed density right upper lobe pulmonary nodule.  This has been enlarging on serial CT scans most recent done 10/24/2020.  It was not hypermetabolic when it was first noted October 2019.  Showed very slight interval growth between November 2020 and 2021, with possibly a larger solid component.  ROV 01/02/21 --Jessica Wells is 75 and follows up today for further evaluation of a slowly enlarging mixed density right upper lobe pulmonary nodule on serial CT scans.  She also has a history of prothrombin 2 mutation, DVT and PE, prior right breast cancer, GERD with hiatal hernia.  There are some question about whether she may have had adenocarcinoma on BAL when she was admitted with bilateral infiltrates in 2014, unclear whether this was a formal diagnosis. She is on Symbicort once a day, not sure whether she benefits from it. Use ventolin very rarely.   PET scan performed 11/27/2020 reviewed by me, shows the subsolid right upper lobe nodule is stable in size and has mild hypermetabolism (SUV max 1.47), there is no distant disease, no enlarged hypermetabolic mediastinal or hilar nodes.  No evidence of distant disease.  She underwent pulmonary function testing 12/26/2020 which I have reviewed, mild obstruction with a borderline bronchodilator response, curve her flow volume loop consistent with obstructive disease.  Coexisting restriction on lung  volumes.  Supranormal DLCO.  All consistent with mild obstructive lung disease.  ROV 02/18/21 --follow-up visit for 75 year old woman, minimal tobacco history, prior DVT/PE on anticoagulation, prior breast cancer. Probable mild asthma.  We were following a subsolid right upper lobe nodule for which she underwent robotic surgery on 01/30/2021.  This showed an invasive moderately differentiated adenocarcinoma without lymphovascular invasion or visceral pleura involvement.  Discharged in stable condition with a small right apical pneumothorax.  She is using Symbicort but only 1 puff daily. She is on flonase for about 6 months out of the year. She believes that the symbicort used to be helpful especially when her allergies were worse, not so sure now. She rarely to never needs albuterol.   MDM: Reviewed hospital notes from 01/30/2021, discharged on 02/01/2021  Review of Systems As per HPI      Objective:   Physical Exam Today's Vitals   02/18/21 1045  BP: 140/80  Pulse: 89  Temp: 97.8 F (36.6 C)  TempSrc: Temporal  SpO2: 98%  Weight: 141 lb 6.4 oz (64.1 kg)  Height: _0  (1.549 m)   Body mass index is 26.72 kg/m.;s Gen: Pleasant, well-nourished, in no distress,  normal affect  ENT: No lesions,  mouth clear,  oropharynx clear, no postnasal drip  Neck: No JVD, no stridor  Lungs: No use of accessory muscles, no crackles or wheezing on normal respiration, no wheeze on forced expiration  Cardiovascular: RRR, heart sounds normal, no murmur or gallops, no peripheral edema  Musculoskeletal: No deformities, no cyanosis or clubbing  Neuro: alert,  awake, non focal  Skin: Warm, no lesions or rash     Assessment & Plan:  Asthma Mild asthma.  Has been much better managed since her allergy regimen has been optimized.  She is on minimal Symbicort and may be able to come off of it.  She will consider trying this but likely not going into the spring months, probably later.  Should keep albuterol  available if needed.  Continue her rhinitis regimen  Seasonal and perennial allergic rhinitis She is about to start Flonase, use it for about 6 months out of the year.  Adenocarcinoma of lung, stage 1, right (HCC) Tolerated resection.  Plans to follow-up with Dr. Roxan Hockey tomorrow.  She will need surveillance CT scans either with Dr. Koleen Nimrod, Dr. Lindi Adie or myself.  Do not see any evidence on the pathology or staging work-up that would suggest she needs adjuvant therapy.  Baltazar Apo, MD, PhD 02/18/2021, 11:08 AM [facility] [phone] or if no answer [phone]

## 2021-02-18 NOTE — Assessment & Plan Note (Signed)
Tolerated resection.  Plans to follow-up with Dr. Roxan Hockey tomorrow.  She will need surveillance CT scans either with Dr. Koleen Nimrod, Dr. Lindi Adie or myself.  Do not see any evidence on the pathology or staging work-up that would suggest she needs adjuvant therapy.

## 2021-02-18 NOTE — Assessment & Plan Note (Signed)
Mild asthma.  Has been much better managed since her allergy regimen has been optimized.  She is on minimal Symbicort and may be able to come off of it.  She will consider trying this but likely not going into the spring months, probably later.  Should keep albuterol available if needed.  Continue her rhinitis regimen

## 2021-02-18 NOTE — Assessment & Plan Note (Signed)
She is about to start Flonase, use it for about 6 months out of the year.

## 2021-02-19 ENCOUNTER — Encounter: Payer: Self-pay | Admitting: *Deleted

## 2021-02-19 ENCOUNTER — Ambulatory Visit (INDEPENDENT_AMBULATORY_CARE_PROVIDER_SITE_OTHER): Payer: Self-pay | Admitting: Thoracic Surgery (Cardiothoracic Vascular Surgery)

## 2021-02-19 ENCOUNTER — Ambulatory Visit
Admission: RE | Admit: 2021-02-19 | Discharge: 2021-02-19 | Disposition: A | Discharge status: Home / Self Care | Payer: Medicare Other | Source: Ambulatory Visit | Attending: Thoracic Surgery (Cardiothoracic Vascular Surgery) | Admitting: Thoracic Surgery (Cardiothoracic Vascular Surgery)

## 2021-02-19 VITALS — BP 160/78 | HR 86 | Resp 20 | Ht 61.0 in | Wt 141.0 lb

## 2021-02-19 DIAGNOSIS — Z9889 Other specified postprocedural states: Secondary | ICD-10-CM | POA: Diagnosis not present

## 2021-02-19 DIAGNOSIS — J984 Other disorders of lung: Secondary | ICD-10-CM | POA: Diagnosis not present

## 2021-02-19 DIAGNOSIS — R918 Other nonspecific abnormal finding of lung field: Secondary | ICD-10-CM | POA: Diagnosis not present

## 2021-02-19 DIAGNOSIS — R911 Solitary pulmonary nodule: Secondary | ICD-10-CM

## 2021-02-19 NOTE — Progress Notes (Signed)
I received a message from Dr. Leonarda Salon office that he would like Dr. Lindi Adie to see patient back.  I completed a scheduling message with an update for patient to be seen in the next few weeks with Dr. Lindi Adie.

## 2021-02-19 NOTE — Progress Notes (Signed)
[address]       [place] [address]             [phone]      HPI: [name] returns for follow-up after recent right upper lobe segmentectomy.  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  She had a remote 15-pack-year history of smoking before quitting in 1980.  She had inflammatory breast cancer in 2013.  Recently on a follow-up she was seen to have a slowly enlarging right upper lobe lung nodule.  I did a right upper lobe posterior segmentectomy on 01/30/2021.  The nodule was a stage Ia (T1, N0) adenocarcinoma.  She did well postoperatively and went home on day 3.  She does have some discomfort.  She been taking 1 Tylenol at night before she goes to bed.  She is not taking any narcotics.  She does have some numbness and bulging in the right upper quadrant.  No respiratory issues.  Past Medical History:  Diagnosis Date  . Allergic reaction to alpha-gal   . Allergy   . Anemia    "after chemo"  . Arthritis    "knees; fingers; occasionally" (05/10/2014)  . Asthma    Uses Inhalers Proventil as needed  . Breast cancer ([facility]) 07/14/12   inflammatory right breast ca, ER/PR -  . Bursitis    "both shoulders"  . Clotting disorder ([place])    prothrombin gene mutation-heterozygous   . DVT (deep venous thrombosis) ([place]) 12/22/2012   BLE  . Dysrhythmia    PAT-sees dr Lina Sayre meds  . GERD (gastroesophageal reflux disease)   . H/O hiatal hernia   . History of blood transfusion 01-25-13   2 units 01-11-13 ("after chem")  . History of chemotherapy    Last dose to be 02-04-13.  Was rx'd with Herceptin & Gemzar  . Lymphedema    RUE  . Osteopenia    "lower spine only" (05/10/2014)  . PAT (paroxysmal atrial tachycardia) (HCC)    hx  . PE (pulmonary thromboembolism) ([place]) 12/27/2012  . Pneumonia 01-25-13   hx.  12-27-12-hospital stay x 9 days, now resolved.  . Pneumonia    "episodic; all my life" (05/10/2014)  . PONV (postoperative nausea and vomiting) 09-13-12   severe, with Port-a-cath, was managed without PONV  . S/P radiation therapy 4-12 wks ago 03/31/13-05/17/13   right breast/supraclavicular fossa/posterior axillary boost    Current Outpatient Medications  Medication Sig Dispense Refill  . albuterol (VENTOLIN HFA) 108 (90 Base) MCG/ACT inhaler INHALE 4 PUFFS BY MOUTH EVERY 4 TO 6 HOURS AS NEEDED FOR COUGH AND FOR WHEEZING .USE  WITH  SPACER. 18 g 0  . atorvastatin (LIPITOR) 10 MG tablet Take 10 mg by mouth daily.    . B Complex-C (SUPER B COMPLEX PO) Take 1 tablet by mouth at bedtime.     . calcium-vitamin D (OSCAL WITH D) 500-200 MG-UNIT per tablet Take 1 tablet by mouth every other day.     Marland Kitchen EPINEPHrine 0.3 mg/0.3 mL IJ SOAJ injection Use as directed for severe allergic reaction 2 each 1  . fluticasone (FLONASE) 50 MCG/ACT nasal spray Place 1 spray into both nostrils as needed for allergies or rhinitis. 16 g 5  . glucosamine-chondroitin 500-400 MG tablet Take 1 tablet by mouth at bedtime.     Marland Kitchen lisinopril (PRINIVIL,ZESTRIL) 2.5 MG tablet Take 2.5  mg by mouth every evening.     . Magnesium 250 MG TABS Take 250 mg by mouth at bedtime as needed (leg cramps).    . metoprolol succinate (TOPROL-XL) 50 MG 24 hr tablet Take 1 tablet (50 mg total) by mouth daily. Take with or immediately following a meal.    . Multiple Vitamin (MULTIVITAMIN WITH MINERALS) TABS Take 1 tablet by mouth every other day.     Marland Kitchen omeprazole (PRILOSEC) 20 MG capsule Take 1 capsule (20 mg total) by mouth daily. For heartburn 30 capsule 5  . SYMBICORT 160-4.5 MCG/ACT inhaler INHALE 2 PUFFS BY MOUTH TWICE DAILY .USE  WITH  SPACER. (Patient taking differently: Inhale 1 puff into the lungs daily.) 11 g 0  . XARELTO 20 MG TABS tablet TAKE 1 TABLET BY MOUTH ONCE DAILY WITH SUPPER (Patient taking differently: Take 20 mg by mouth daily  with supper.) 90 tablet 0   No current facility-administered medications for this visit.    Physical Exam BP (!) 160/78   Pulse 86   Resp 20   Ht 5\' 1"  (1.549 m)   Wt 141 lb (64 kg)   LMP  (LMP Unknown) Comment: tubal ligation  SpO2 95% Comment: RA  BMI 26.65 kg/m  75 year old woman in no acute distress Alert and oriented x3 with no focal deficits Lungs clear bilaterally Cardiac regular rate and rhythm Incisions clean dry and intact  Diagnostic Tests: I reviewed her chest x-ray.  Shows postoperative changes.  Impression: Jessica Wells is a 75 year old woman with a history of inflammatory breast cancer, DVT, clotting disorder, paroxysmal atrial tachycardia, alpha gal, asthma, arthritis, reflux, and osteopenia.  She has a remote history of light tobacco use.  She had a slowly enlarging nodule in the right upper lobe.  We did a posterior segmentectomy.  That turned out to be a stage Ia adenocarcinoma.  She should not need any adjuvant therapy.  We will arrange for her to have a follow-up appointment with Dr. Lindi Adie.  I will defer follow-up to him.  She is not taking any narcotics.  She does have a little discomfort.  She is a little reticent to resume normal activities.  I recommended that she build into her normal activities gradually.  There are no restrictions on her activities.  She may drive.  Plan: Follow-up with Dr. Lindi Adie Return in 2 months with PA lateral chest x-ray to check on progress  [name], MD [facility] [phone]

## 2021-02-20 ENCOUNTER — Telehealth: Payer: Self-pay | Admitting: Hematology and Oncology

## 2021-02-20 NOTE — Telephone Encounter (Signed)
Scheduled appt per 3/22 sch msg. Pt aware.

## 2021-02-28 DIAGNOSIS — E7800 Pure hypercholesterolemia, unspecified: Secondary | ICD-10-CM | POA: Diagnosis not present

## 2021-02-28 DIAGNOSIS — E119 Type 2 diabetes mellitus without complications: Secondary | ICD-10-CM | POA: Diagnosis not present

## 2021-02-28 DIAGNOSIS — C3491 Malignant neoplasm of unspecified part of right bronchus or lung: Secondary | ICD-10-CM | POA: Diagnosis not present

## 2021-02-28 DIAGNOSIS — E1169 Type 2 diabetes mellitus with other specified complication: Secondary | ICD-10-CM | POA: Diagnosis not present

## 2021-02-28 DIAGNOSIS — Z853 Personal history of malignant neoplasm of breast: Secondary | ICD-10-CM | POA: Diagnosis not present

## 2021-02-28 DIAGNOSIS — M8580 Other specified disorders of bone density and structure, unspecified site: Secondary | ICD-10-CM | POA: Diagnosis not present

## 2021-02-28 DIAGNOSIS — J4540 Moderate persistent asthma, uncomplicated: Secondary | ICD-10-CM | POA: Diagnosis not present

## 2021-03-05 DIAGNOSIS — D6852 Prothrombin gene mutation: Secondary | ICD-10-CM | POA: Diagnosis not present

## 2021-03-05 DIAGNOSIS — Z853 Personal history of malignant neoplasm of breast: Secondary | ICD-10-CM | POA: Diagnosis not present

## 2021-03-05 DIAGNOSIS — C3491 Malignant neoplasm of unspecified part of right bronchus or lung: Secondary | ICD-10-CM | POA: Diagnosis not present

## 2021-03-05 DIAGNOSIS — M25561 Pain in right knee: Secondary | ICD-10-CM | POA: Diagnosis not present

## 2021-03-05 DIAGNOSIS — J302 Other seasonal allergic rhinitis: Secondary | ICD-10-CM | POA: Diagnosis not present

## 2021-03-05 DIAGNOSIS — J4540 Moderate persistent asthma, uncomplicated: Secondary | ICD-10-CM | POA: Diagnosis not present

## 2021-03-05 DIAGNOSIS — I471 Supraventricular tachycardia: Secondary | ICD-10-CM | POA: Diagnosis not present

## 2021-03-05 DIAGNOSIS — E7800 Pure hypercholesterolemia, unspecified: Secondary | ICD-10-CM | POA: Diagnosis not present

## 2021-03-05 DIAGNOSIS — E1169 Type 2 diabetes mellitus with other specified complication: Secondary | ICD-10-CM | POA: Diagnosis not present

## 2021-03-06 NOTE — Assessment & Plan Note (Signed)
Lung adenocarcinoma:Diagnosed in 2014 CT scans done11/24/21;negative for cancer,no significant change in the mixed solid and groundglass nodule right upper lobe 8 mm. The solid component 5 mm. Over the years it has slightly increased in size but it has not changed since last year.  CT chest 10/21/20: Continued mild increase in size of part solid nodule within the central right upper lobe. The solid component of this nodule remains stable at 5 mm. Given the gradual overall increase in size of this lesion this is concerning for low-grade indolent neoplasm such as pulmonary adenocarcinoma. Continue annual CT is recommended until 5 years of stability has been established.

## 2021-03-06 NOTE — Progress Notes (Signed)
Patient Care Team: Leighton Ruff, MD as PCP - General (Family Medicine) Jerline Pain, MD as PCP - Cardiology (Cardiology)  DIAGNOSIS:    ICD-10-CM   1. Malignant neoplasm of central portion of right breast in female, estrogen receptor negative ([place])  C50.111    Z17.1   2. Adenocarcinoma of lung, stage 1, right (HCC)  C34.91   3. Malignant neoplasm of unspecified part of unspecified bronchus or lung ([place])  C34.90 CT Chest W Contrast    SUMMARY OF ONCOLOGIC HISTORY: Oncology History  Cancer of central portion of right female breast ([facility] [place])  07/14/2012 Mammogram   And invasive mammary carcinoma with lymphovascular invasion grade 3 ER negative PR negative HER-2 positive inflammatory breast cancer   07/19/2012 Breast MRI   Right breast neoplasm level I and right axillary lymph node compatible with lymph node metastases biopsy proven to be invasive mammary cancer   07/23/2012 - 12/17/2012 Neo-Adjuvant Chemotherapy   Neoadjuvant FEC 100 followed by Taxol Herceptin due to neuropathy Taxol was replaced by gemcitabine x5 cycles   12/07/2012 -  Hospital Admission   Bilateral lower extremity DVT, workup showed prothrombin gene mutation heterozygous and IVC filter, bilateral pulmonary emboli and acute respiratory failure requiring vent support bronchoscopy and BAL concerning for adenocarcinoma, PET/CT negative   02/08/2013 Surgery   Right mastectomy showed complete response to chemotherapy in the breast and 11 lymph nodes negative   02/25/2013 - 10/14/2013 Chemotherapy   Herceptin maintenance for one year   03/31/2013 - 05/17/2013 Radiation Therapy   Adjuvant radiation therapy    05/10/2014 Surgery   Left mastectomy   09/29/2018 PET scan   No significant PET uptake in the subcentimeter solid right upper lobe lung nodule measuring 6 mm on the current study     CHIEF COMPLIANT: Surveillance of right breast cancer  INTERVAL HISTORY: Jessica Wells is a 75 y.o. with above-mentioned history of  right breast cancer treated with neoadjuvant chemotherapy,right mastectomy,and radiation who is currently on surveillance.She also has a history of non-small cell lung cancer for which she recently underwent a right upper lobe segmentectomy and is followed by Dr. Lamonte Sakai. She presents to the clinic today for follow-up.   ALLERGIES:  is allergic to alpha-gal; anesthetics, amide; bactrim [sulfamethoxazole-trimethoprim]; sulfa antibiotics; codeine; codeine phosphate; eggs or egg-derived products; fish allergy; milk-related compounds; and other.  MEDICATIONS:  Current Outpatient Medications  Medication Sig Dispense Refill  . albuterol (VENTOLIN HFA) 108 (90 Base) MCG/ACT inhaler INHALE 4 PUFFS BY MOUTH EVERY 4 TO 6 HOURS AS NEEDED FOR COUGH AND FOR WHEEZING .USE  WITH  SPACER. 18 g 0  . atorvastatin (LIPITOR) 10 MG tablet Take 10 mg by mouth daily.    . B Complex-C (SUPER B COMPLEX PO) Take 1 tablet by mouth at bedtime.     . calcium-vitamin D (OSCAL WITH D) 500-200 MG-UNIT per tablet Take 1 tablet by mouth every other day.     Marland Kitchen COVID-19 mRNA vaccine, Pfizer, 30 MCG/0.3ML injection INJECT AS DIRECTED .3 mL 0  . EPINEPHrine 0.3 mg/0.3 mL IJ SOAJ injection Use as directed for severe allergic reaction 2 each 1  . fluticasone (FLONASE) 50 MCG/ACT nasal spray Place 1 spray into both nostrils as needed for allergies or rhinitis. 16 g 5  . glucosamine-chondroitin 500-400 MG tablet Take 1 tablet by mouth at bedtime.     Marland Kitchen lisinopril (PRINIVIL,ZESTRIL) 2.5 MG tablet Take 2.5 mg by mouth every evening.     . Magnesium 250 MG TABS Take 250  mg by mouth at bedtime as needed (leg cramps).    . metoprolol succinate (TOPROL-XL) 50 MG 24 hr tablet Take 1 tablet (50 mg total) by mouth daily. Take with or immediately following a meal.    . Multiple Vitamin (MULTIVITAMIN WITH MINERALS) TABS Take 1 tablet by mouth every other day.     Marland Kitchen omeprazole (PRILOSEC) 20 MG capsule Take 1 capsule (20 mg total) by mouth daily. For  heartburn 30 capsule 5  . SYMBICORT 160-4.5 MCG/ACT inhaler INHALE 2 PUFFS BY MOUTH TWICE DAILY .USE  WITH  SPACER. (Patient taking differently: Inhale 1 puff into the lungs daily.) 11 g 0  . XARELTO 20 MG TABS tablet TAKE 1 TABLET BY MOUTH ONCE DAILY WITH SUPPER (Patient taking differently: Take 20 mg by mouth daily with supper.) 90 tablet 0   No current facility-administered medications for this visit.    PHYSICAL EXAMINATION: ECOG PERFORMANCE STATUS: 1 - Symptomatic but completely ambulatory  Vitals:   03/07/21 0834  BP: (!) 143/77  Pulse: 72  Resp: 20  Temp: 97.7 F (36.5 C)  SpO2: 98%   Filed Weights   03/07/21 0834  Weight: 139 lb 4.8 oz (63.2 kg)      LABORATORY DATA:  I have reviewed the data as listed CMP Latest Ref Rng & Units 02/01/2021 01/31/2021 01/30/2021  Glucose 70 - 99 mg/dL 127(H) 232(H) -  BUN 8 - 23 mg/dL 16 13 -  Creatinine 0.44 - 1.00 mg/dL 0.87 0.94 -  Sodium 135 - 145 mmol/L 135 137 140  Potassium 3.5 - 5.1 mmol/L 3.7 4.0 3.5  Chloride 98 - 111 mmol/L 104 103 -  CO2 22 - 32 mmol/L 25 25 -  Calcium 8.9 - 10.3 mg/dL 8.6(L) 9.0 -  Total Protein 6.5 - 8.1 g/dL - - -  Total Bilirubin 0.3 - 1.2 mg/dL - - -  Alkaline Phos 38 - 126 U/L - - -  AST 15 - 41 U/L - - -  ALT 0 - 44 U/L - - -    Lab Results  Component Value Date   WBC 8.3 02/01/2021   HGB 10.2 (L) 02/01/2021   HCT 29.1 (L) 02/01/2021   MCV 91.5 02/01/2021   PLT 125 (L) 02/01/2021   NEUTROABS 6.8 09/15/2018    ASSESSMENT & PLAN:  Cancer of central portion of right female breast Right breast inflammation breast cancer diagnosed August 2013 treated with neoadjuvant chemotherapy completed 12/17/2012 hospitalized for bilateral DVT and respiratory failure, bronchoscopy showed adenocarcinoma, right mastectomy 02/08/2013 followed by Herceptin maintenance completed 10/14/2013, adjuvant radiation completed 05/17/2013.   Adenocarcinoma of lung, stage 1, right ([place]) Lung adenocarcinoma:Diagnosed in  2014 CT scans done11/24/21;negative for cancer,no significant change in the mixed solid and groundglass nodule right upper lobe 8 mm. The solid component 5 mm. Over the years it has slightly increased in size but it has not changed since last year.  Right upper lobe lung posterior segmentectomy: Invasive well to moderately differentiated carcinoma 0.8 cm with lipidic and acinar patterns, negative for lymphovascular invasion, 0/11 lymph nodes negative  Plan: Surveillance with CT chest in 3 months and follow-up after that Patient is doing extremely well physically and functionally.    Orders Placed This Encounter  Procedures  . CT Chest W Contrast    Standing Status:   Future    Standing Expiration Date:   03/07/2022    Order Specific Question:   If indicated for the ordered procedure, I authorize the administration of contrast media  per Radiology protocol    Answer:   Yes    Order Specific Question:   Preferred imaging location?    Answer:   [facility]    Order Specific Question:   Release to patient    Answer:   Immediate   The patient has a good understanding of the overall plan. she agrees with it. she will call with any problems that may develop before the next visit here.  Total time spent: 20 mins including face to face time and time spent for planning, charting and coordination of care  Rulon Eisenmenger, MD, MPH 03/07/2021  I, Molly Dorshimer, am acting as scribe for Dr. Nicholas Lose.  I have reviewed the above documentation for accuracy and completeness, and I agree with the above.

## 2021-03-06 NOTE — Assessment & Plan Note (Signed)
Right breast inflammation breast cancer diagnosed August 2013 treated with neoadjuvant chemotherapy completed 12/17/2012 hospitalized for bilateral DVT and respiratory failure, bronchoscopy showed adenocarcinoma, right mastectomy 02/08/2013 followed by Herceptin maintenance completed 10/14/2013, adjuvant radiation completed 05/17/2013.

## 2021-03-07 ENCOUNTER — Other Ambulatory Visit: Payer: Self-pay

## 2021-03-07 ENCOUNTER — Inpatient Hospital Stay: Payer: Medicare Other | Attending: Hematology and Oncology | Admitting: Hematology and Oncology

## 2021-03-07 ENCOUNTER — Ambulatory Visit (INDEPENDENT_AMBULATORY_CARE_PROVIDER_SITE_OTHER): Payer: Self-pay

## 2021-03-07 DIAGNOSIS — Z9221 Personal history of antineoplastic chemotherapy: Secondary | ICD-10-CM | POA: Insufficient documentation

## 2021-03-07 DIAGNOSIS — Z9012 Acquired absence of left breast and nipple: Secondary | ICD-10-CM | POA: Insufficient documentation

## 2021-03-07 DIAGNOSIS — C3491 Malignant neoplasm of unspecified part of right bronchus or lung: Secondary | ICD-10-CM | POA: Diagnosis not present

## 2021-03-07 DIAGNOSIS — C3490 Malignant neoplasm of unspecified part of unspecified bronchus or lung: Secondary | ICD-10-CM

## 2021-03-07 DIAGNOSIS — C773 Secondary and unspecified malignant neoplasm of axilla and upper limb lymph nodes: Secondary | ICD-10-CM | POA: Diagnosis not present

## 2021-03-07 DIAGNOSIS — Z79899 Other long term (current) drug therapy: Secondary | ICD-10-CM | POA: Diagnosis not present

## 2021-03-07 DIAGNOSIS — Z171 Estrogen receptor negative status [ER-]: Secondary | ICD-10-CM | POA: Diagnosis not present

## 2021-03-07 DIAGNOSIS — Z4802 Encounter for removal of sutures: Secondary | ICD-10-CM

## 2021-03-07 DIAGNOSIS — C50111 Malignant neoplasm of central portion of right female breast: Secondary | ICD-10-CM | POA: Diagnosis not present

## 2021-03-07 DIAGNOSIS — Z923 Personal history of irradiation: Secondary | ICD-10-CM | POA: Diagnosis not present

## 2021-03-07 DIAGNOSIS — Z7901 Long term (current) use of anticoagulants: Secondary | ICD-10-CM | POA: Insufficient documentation

## 2021-03-07 DIAGNOSIS — Z9013 Acquired absence of bilateral breasts and nipples: Secondary | ICD-10-CM | POA: Insufficient documentation

## 2021-03-07 NOTE — Progress Notes (Signed)
Patient arrived for nurse visit to look at incisions as she was told she had sutures still in place.  Patient is s/p Right RATS RUL Segmentectomy with Dr. Roxan Hockey 01/30/21.  Upon observation, sutures were internal sutures that came to the surface, one being enbedded into a scab.  Advised that I would trim the sutures that are protruding.  Scab removed in the process and open wound apparent. Applied betadine to the sites and covered with a band-aid.    No signs of infection noted and advised to contact the office if there are any changes. Patient tolerated procedure well.  Patient/ family instructed to keep the incision sites clean and dry, shower with soap and water and pat dry.  Patient/ family acknowledged instructions given.

## 2021-03-11 ENCOUNTER — Other Ambulatory Visit: Payer: Self-pay | Admitting: Hematology and Oncology

## 2021-03-11 DIAGNOSIS — C50111 Malignant neoplasm of central portion of right female breast: Secondary | ICD-10-CM

## 2021-03-14 ENCOUNTER — Inpatient Hospital Stay: Payer: Medicare Other

## 2021-03-14 ENCOUNTER — Telehealth: Payer: Self-pay | Admitting: *Deleted

## 2021-03-14 NOTE — Telephone Encounter (Signed)
I received referral from Dr. Drucilla Schmidt for [name] to see Dr. Julien Nordmann. I called and spoke to her. I scheduled her to be seen on 03/17/21.  She verbalized understanding of appt.

## 2021-03-18 ENCOUNTER — Telehealth: Payer: Self-pay | Admitting: Oncology

## 2021-03-18 DIAGNOSIS — M25561 Pain in right knee: Secondary | ICD-10-CM | POA: Diagnosis not present

## 2021-03-18 NOTE — Telephone Encounter (Signed)
Scheduled per 4/7 LOS, Patient notified and confirmed appt.

## 2021-03-21 DIAGNOSIS — M25561 Pain in right knee: Secondary | ICD-10-CM | POA: Diagnosis not present

## 2021-03-25 DIAGNOSIS — M25561 Pain in right knee: Secondary | ICD-10-CM | POA: Diagnosis not present

## 2021-03-26 ENCOUNTER — Telehealth: Payer: Self-pay | Admitting: *Deleted

## 2021-03-26 DIAGNOSIS — C3491 Malignant neoplasm of unspecified part of right bronchus or lung: Secondary | ICD-10-CM

## 2021-03-26 NOTE — Telephone Encounter (Signed)
I called patient to remind her of the appt tomorrow. She seemed angry and states she has already checked in via mychart. I did update her that her appts were not scheduled. She again seemed angry and explained that whomever called her stated labs at 1:45.  I then again explained that the lab appt was not in the computer put I will put in.

## 2021-03-27 ENCOUNTER — Inpatient Hospital Stay: Payer: Medicare Other

## 2021-03-27 ENCOUNTER — Inpatient Hospital Stay (HOSPITAL_BASED_OUTPATIENT_CLINIC_OR_DEPARTMENT_OTHER): Payer: Medicare Other | Admitting: Internal Medicine

## 2021-03-27 ENCOUNTER — Telehealth: Payer: Self-pay | Admitting: Internal Medicine

## 2021-03-27 ENCOUNTER — Encounter: Payer: Self-pay | Admitting: Internal Medicine

## 2021-03-27 ENCOUNTER — Other Ambulatory Visit: Payer: Self-pay

## 2021-03-27 VITALS — BP 126/69 | HR 71 | Temp 97.9°F | Resp 18 | Ht 61.0 in | Wt 138.4 lb

## 2021-03-27 DIAGNOSIS — C3491 Malignant neoplasm of unspecified part of right bronchus or lung: Secondary | ICD-10-CM

## 2021-03-27 DIAGNOSIS — Z923 Personal history of irradiation: Secondary | ICD-10-CM | POA: Diagnosis not present

## 2021-03-27 DIAGNOSIS — Z171 Estrogen receptor negative status [ER-]: Secondary | ICD-10-CM | POA: Diagnosis not present

## 2021-03-27 DIAGNOSIS — C50111 Malignant neoplasm of central portion of right female breast: Secondary | ICD-10-CM | POA: Diagnosis not present

## 2021-03-27 DIAGNOSIS — C773 Secondary and unspecified malignant neoplasm of axilla and upper limb lymph nodes: Secondary | ICD-10-CM | POA: Diagnosis not present

## 2021-03-27 DIAGNOSIS — Z9221 Personal history of antineoplastic chemotherapy: Secondary | ICD-10-CM | POA: Diagnosis not present

## 2021-03-27 LAB — CBC WITH DIFFERENTIAL (CANCER CENTER ONLY)
Abs Immature Granulocytes: 0.01 10*3/uL (ref 0.00–0.07)
Basophils Absolute: 0.1 10*3/uL (ref 0.0–0.1)
Basophils Relative: 1 %
Eosinophils Absolute: 0.1 10*3/uL (ref 0.0–0.5)
Eosinophils Relative: 2 %
HCT: 37 % (ref 36.0–46.0)
Hemoglobin: 12.3 g/dL (ref 12.0–15.0)
Immature Granulocytes: 0 %
Lymphocytes Relative: 28 %
Lymphs Abs: 1.7 10*3/uL (ref 0.7–4.0)
MCH: 30.7 pg (ref 26.0–34.0)
MCHC: 33.2 g/dL (ref 30.0–36.0)
MCV: 92.3 fL (ref 80.0–100.0)
Monocytes Absolute: 0.5 10*3/uL (ref 0.1–1.0)
Monocytes Relative: 8 %
Neutro Abs: 3.8 10*3/uL (ref 1.7–7.7)
Neutrophils Relative %: 61 %
Platelet Count: 160 10*3/uL (ref 150–400)
RBC: 4.01 MIL/uL (ref 3.87–5.11)
RDW: 12 % (ref 11.5–15.5)
WBC Count: 6.1 10*3/uL (ref 4.0–10.5)
nRBC: 0 % (ref 0.0–0.2)

## 2021-03-27 LAB — BASIC METABOLIC PANEL - CANCER CENTER ONLY
Anion gap: 10 (ref 5–15)
BUN: 19 mg/dL (ref 8–23)
CO2: 27 mmol/L (ref 22–32)
Calcium: 9.3 mg/dL (ref 8.9–10.3)
Chloride: 104 mmol/L (ref 98–111)
Creatinine: 0.87 mg/dL (ref 0.44–1.00)
GFR, Estimated: >60 mL/min (ref >60)
Glucose, Bld: 130 mg/dL — ABNORMAL HIGH (ref 70–99)
Potassium: 3.7 mmol/L (ref 3.5–5.1)
Sodium: 141 mmol/L (ref 135–145)

## 2021-03-27 NOTE — Progress Notes (Signed)
[place] [phone]   [phone]  CONSULT NOTE  REFERRING PHYSICIAN: Dr. Modesto Charon  REASON FOR CONSULTATION:  75 years old white female recently diagnosed with lung cancer.  HPI Jessica Wells is a 75 y.o. female with past medical history significant for inflammatory right breast cancer with positive lymph nodes, hormone receptor negative and HER2 positive status post neoadjuvant chemotherapy with FEC followed by weekly gemcitabine and Herceptin followed by right mastectomy by Dr. Donne Hazel in addition to adjuvant Herceptin and adjuvant radiotherapy.  The patient also has a history of anemia, osteoarthritis, asthma, deep venous thrombosis, GERD, PE, pneumonia.  In 2017 the patient was found to have a small subsolid nodule in the right upper lobe that has been slowly growing with a small solid component.  CT scan of the chest on October 24, 2020 showed continued mild increase in the size of the part solid nodule within the central right upper lobe.  The solid component of this nodule remains stable at 5 mm and it was concerning for low-grade indolent neoplasm such as pulmonary adenocarcinoma.  The patient had a PET scan on November 27, 2020 and it showed low-level hypermetabolism in the partially subsolid right upper lobe pulmonary nodule somewhat concerning for a low-grade adenocarcinoma.  There was no enlarged or hypermetabolic mediastinal or hilar lymph nodes.  And no significant findings in the abdomen/pelvis or bony structures.  On January 30, 2021 the patient underwent electromagnetic navigation bronchoscopy followed by a right VATS and right upper lobe posterior segmentectomy with lymph node dissection under the care of Dr. Roxan Hockey.  The final pathology [id number]) showed invasive well to moderately differentiated adenocarcinoma measuring 0.8 cm with lipidic and acinar patterns.  The visceral pleura was not involved with and the resection  margins were negative for malignancy.  There was no evidence for lymphovascular invasion and the dissected lymph nodes were negative for malignancy. Dr. Roxan Hockey kindly referred the patient to me today for evaluation and recommendation regarding treatment of her condition. When seen today the patient is feeling fine today with no concerning complaints.  She denied having any chest pain, shortness of breath, cough or hemoptysis.  She denied having any fever or chills.  She has no nausea, vomiting, diarrhea or constipation.  She has no headache or visual changes. Family history significant for mother with diabetes mellitus, father had lung cancer and a sister also had lung cancer. The patient is single and has 1 son.  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  She has a history of smoking for around 15 years but quit in 1980.  She has no history of alcohol or drug abuse.  HPI  Past Medical History:  Diagnosis Date  . Allergic reaction to alpha-gal   . Allergy   . Anemia    "after chemo"  . Arthritis    "knees; fingers; occasionally" (05/10/2014)  . Asthma    Uses Inhalers Proventil as needed  . Breast cancer ([facility]) 07/14/12   inflammatory right breast ca, ER/PR -  . Bursitis    "both shoulders"  . Clotting disorder ([place])    prothrombin gene mutation-heterozygous   . DVT (deep venous thrombosis) ([facility]) 12/22/2012   BLE  . Dysrhythmia    PAT-sees dr Lina Sayre meds  . GERD (gastroesophageal reflux disease)   . H/O hiatal hernia   . History of blood transfusion 01-25-13   2 units 01-11-13 ("after chem")  . History of chemotherapy  Last dose to be 02-04-13.  Was rx'd with Herceptin & Gemzar  . Lymphedema    RUE  . Osteopenia    "lower spine only" (05/10/2014)  . PAT (paroxysmal atrial tachycardia) ([facility])    hx  . PE (pulmonary thromboembolism) ([facility]) 12/27/2012  . Pneumonia 01-25-13   hx. 12-27-12-hospital stay x 9 days, now resolved.  . Pneumonia    "episodic; all my life"  (05/10/2014)  . PONV (postoperative nausea and vomiting) 09-13-12   severe, with Port-a-cath, was managed without PONV  . S/P radiation therapy 4-12 wks ago 03/31/13-05/17/13   right breast/supraclavicular fossa/posterior axillary boost    Past Surgical History:  Procedure Laterality Date  . BREAST BIOPSY Right 2013 X 3  . CHALAZION EXCISION Right 1980's   right( MD office)  . COLONOSCOPY    . DILATION AND CURETTAGE OF UTERUS  1998 X 3  . INSERTION OF VENA CAVA FILTER  12/2012  . INTERCOSTAL NERVE BLOCK Right 01/30/2021   Procedure: INTERCOSTAL NERVE BLOCK;  Surgeon: [name], MD;  Location: [facility];  Service: Thoracic;  Laterality: Right;  . MASTECTOMY Left 05/10/2014   PROPHYLACTIC   . MASTECTOMY MODIFIED RADICAL Right 02/08/2013   Procedure: MASTECTOMY MODIFIED RADICAL;  Surgeon: Rolm Bookbinder, MD;  Location: [facility];  Service: General;  Laterality: Right;  . MASTECTOMY MODIFIED RADICAL Right   . NODE DISSECTION Right 01/30/2021   Procedure: NODE DISSECTION;  Surgeon: [name], MD;  Location: [facility];  Service: Thoracic;  Laterality: Right;  . NODE DISSECTION (Right)  01/30/2021  . PORT-A-CATH REMOVAL  05/10/2014  . PORT-A-CATH REMOVAL N/A 05/10/2014   Procedure: REMOVAL PORT-A-CATH;  Surgeon: Rolm Bookbinder, MD;  Location: [facility];  Service: General;  Laterality: N/A;  . PORTACATH PLACEMENT  07/21/2012   Procedure: INSERTION PORT-A-CATH;  Surgeon: Rolm Bookbinder, MD;  Location: [facility];  Service: General;  Laterality: Left;/ Replacement done 10'13  . SIMPLE MASTECTOMY WITH AXILLARY SENTINEL NODE BIOPSY Left 05/10/2014   Procedure: PROPHYLACTIC LEFT MASTECTOMY;  Surgeon: Rolm Bookbinder, MD;  Location: [facility];  Service: General;  Laterality: Left;  . TONSILLECTOMY    . TUBAL LIGATION  ~ 1974  . TUMOR EXCISION Right 1970   giant cell, off my forearm"  . VIDEO BRONCHOSCOPY WITH ENDOBRONCHIAL NAVIGATION N/A 01/30/2021   Procedure: VIDEO BRONCHOSCOPY  WITH ENDOBRONCHIAL NAVIGATION FOR TUMOR MARKING;  Surgeon: [name], MD;  Location: [facility];  Service: Thoracic;  Laterality: N/A;  . VIDEO BRONCHOSCOPY WITH ENDOBRONCHIAL NAVIGATION FOR TUMOR MARKING (N/A)  01/30/2021  . XI ROBOTIC ASSISTED THORACOSCOPY- SEGMENTECTOMY Right 01/30/2021   Procedure: XI ROBOTIC ASSISTED THORACOSCOPY-POSTERIOR SEGMENTECTOMY RIGHT UPPER LOBE;  Surgeon: [name], MD;  Location: [facility];  Service: Thoracic;  Laterality: Right;  . XI ROBOTIC ASSISTED THORACOSCOPY-POSTERIOR SEGMENTECTOMY RIGHT UPPER LOBE (Right)  01/30/2021    Family History  Problem Relation Age of Onset  . Diabetes Mother   . Heart disease Mother   . Hypertension Mother   . Hyperlipidemia Mother   . Breast cancer Mother 66       lobular breast cancer  . Heart disease Maternal Grandmother        died in her 31s from heart disease  . Lung cancer Father 30       adenocarcinoma  . Allergic rhinitis Father   . Asthma Father   . Cancer Paternal Uncle        dx in mid 68s with a cancer in the leg, died  late 51s; grandpaternal half uncle  . Allergic rhinitis Sister   . Asthma Sister   . Heart disease Maternal Aunt   . Heart disease Maternal Uncle   . Allergic rhinitis Son   . Asthma Son   . Angioedema Neg Hx   . Eczema Neg Hx   . Immunodeficiency Neg Hx   . Urticaria Neg Hx     Social History Social History   Tobacco Use  . Smoking status: Former Smoker    Packs/day: 1.00    Years: 15.00    Pack years: 15.00    Types: Cigarettes    Quit date: 07/21/1979    Years since quitting: 41.7  . Smokeless tobacco: Never Used  Vaping Use  . Vaping Use: Never used  Substance Use Topics  . Alcohol use: No  . Drug use: No    Allergies  Allergen Reactions  . Alpha-Gal Hives, Itching, Rash and Shortness Of Breath  . Anesthetics, Amide Nausea And Vomiting    Projectile vomitting-Nausea- with 24hrs of dry heaves. With any anesthetics  . Bactrim  [Sulfamethoxazole-Trimethoprim] Other (See Comments)    Massive diarrhea, nausea vomiting, platelets dropped, hemoglobin dropped, admitted to hospital  . Sulfa Antibiotics Nausea And Vomiting and Other (See Comments)    Crazy feeling in head Life threatening reaction, decreased blood counts  . Codeine Nausea And Vomiting  . Codeine Phosphate Nausea And Vomiting  . Eggs Or Egg-Derived Products Hives  . Fish Allergy   . Milk-Related Compounds      Upset stomach   . Other     All tree nuts itching,sneezing    Current Outpatient Medications  Medication Sig Dispense Refill  . albuterol (VENTOLIN HFA) 108 (90 Base) MCG/ACT inhaler INHALE 4 PUFFS BY MOUTH EVERY 4 TO 6 HOURS AS NEEDED FOR COUGH AND FOR WHEEZING .USE  WITH  SPACER. 18 g 0  . atorvastatin (LIPITOR) 10 MG tablet Take 10 mg by mouth daily.    . B Complex-C (SUPER B COMPLEX PO) Take 1 tablet by mouth at bedtime.     . calcium-vitamin D (OSCAL WITH D) 500-200 MG-UNIT per tablet Take 1 tablet by mouth every other day.     Marland Kitchen COVID-19 mRNA vaccine, Pfizer, 30 MCG/0.3ML injection INJECT AS DIRECTED .3 mL 0  . EPINEPHrine 0.3 mg/0.3 mL IJ SOAJ injection Use as directed for severe allergic reaction 2 each 1  . fluticasone (FLONASE) 50 MCG/ACT nasal spray Place 1 spray into both nostrils as needed for allergies or rhinitis. 16 g 5  . glucosamine-chondroitin 500-400 MG tablet Take 1 tablet by mouth at bedtime.     Marland Kitchen lisinopril (PRINIVIL,ZESTRIL) 2.5 MG tablet Take 2.5 mg by mouth every evening.     . Magnesium 250 MG TABS Take 250 mg by mouth at bedtime as needed (leg cramps).    . metoprolol succinate (TOPROL-XL) 50 MG 24 hr tablet Take 1 tablet (50 mg total) by mouth daily. Take with or immediately following a meal.    . Multiple Vitamin (MULTIVITAMIN WITH MINERALS) TABS Take 1 tablet by mouth every other day.     Marland Kitchen omeprazole (PRILOSEC) 20 MG capsule Take 1 capsule (20 mg total) by mouth daily. For heartburn 30 capsule 5  . SYMBICORT  160-4.5 MCG/ACT inhaler INHALE 2 PUFFS BY MOUTH TWICE DAILY .USE  WITH  SPACER. (Patient taking differently: Inhale 1 puff into the lungs daily.) 11 g 0  . XARELTO 20 MG TABS tablet TAKE 1 TABLET BY MOUTH ONCE DAILY  WITH SUPPER 90 tablet 0   No current facility-administered medications for this visit.    Review of Systems  Constitutional: positive for fatigue Eyes: negative Ears, nose, mouth, throat, and face: negative Respiratory: negative Cardiovascular: negative Gastrointestinal: negative Genitourinary:negative Integument/breast: negative Hematologic/lymphatic: negative Musculoskeletal:negative Neurological: negative Behavioral/Psych: negative Endocrine: negative Allergic/Immunologic: negative  Physical Exam  UTM:LYYTK, healthy, no distress, well nourished and well developed SKIN: skin color, texture, turgor are normal, no rashes or significant lesions HEAD: Normocephalic, No masses, lesions, tenderness or abnormalities EYES: normal, PERRLA, Conjunctiva are pink and non-injected EARS: External ears normal, Canals clear OROPHARYNX:no exudate, no erythema and lips, buccal mucosa, and tongue normal  NECK: supple, no adenopathy, no JVD LYMPH:  no palpable lymphadenopathy, no hepatosplenomegaly BREAST:not examined LUNGS: clear to auscultation , and palpation HEART: regular rate & rhythm, no murmurs and no gallops ABDOMEN:abdomen soft, non-tender, normal bowel sounds and no masses or organomegaly BACK: No CVA tenderness, Range of motion is normal EXTREMITIES:no joint deformities, effusion, or inflammation, no edema  NEURO: alert & oriented x 3 with fluent speech, no focal motor/sensory deficits  PERFORMANCE STATUS: ECOG 1  LABORATORY DATA: Lab Results  Component Value Date   WBC 8.3 02/01/2021   HGB 10.2 (L) 02/01/2021   HCT 29.1 (L) 02/01/2021   MCV 91.5 02/01/2021   PLT 125 (L) 02/01/2021      Chemistry      Component Value Date/Time   NA 135 02/01/2021 0150    NA 139 10/15/2017 0811   K 3.7 02/01/2021 0150   K 4.0 10/15/2017 0811   CL 104 02/01/2021 0150   CL 105 05/20/2013 1459   CO2 25 02/01/2021 0150   CO2 27 10/15/2017 0811   BUN 16 02/01/2021 0150   BUN 25.6 10/15/2017 0811   CREATININE 0.87 02/01/2021 0150   CREATININE 0.9 10/15/2017 0811      Component Value Date/Time   CALCIUM 8.6 (L) 02/01/2021 0150   CALCIUM 9.6 10/15/2017 0811   ALKPHOS 49 01/28/2021 1318   ALKPHOS 51 10/15/2017 0811   AST 21 01/28/2021 1318   AST 18 10/15/2017 0811   ALT 24 01/28/2021 1318   ALT 23 10/15/2017 0811   BILITOT 0.7 01/28/2021 1318   BILITOT 0.68 10/15/2017 0811       RADIOGRAPHIC STUDIES: No results found.  ASSESSMENT: This is a very pleasant 75 years old white female recently diagnosed with a stage Ia (T1 a, N0, M0) non-small cell lung cancer, adenocarcinoma presented with right upper lobe nodule status post right upper lobe superior segmentectomy with lymph node dissection under the care of Dr. Roxan Hockey on January 30, 2021. The patient also has a history of inflammatory breast cancer involving the right breast, HR negative and HER2 positive status post neoadjuvant chemotherapy followed by right mastectomy followed by adjuvant therapy and she was followed by Dr. Laurelyn Sickle and Dr. Lindi Adie.Marland Kitchen   PLAN: I had a lengthy discussion with the patient today about her current disease stage, prognosis and treatment options. I explained to the patient that she has a very early stage of lung cancer and she already received curative treatment with surgery. I also explained to the patient that there is no role for adjuvant systemic chemotherapy for patient with a stage Ia non-small cell lung cancer and the current standard of care is observation and close monitoring. I recommended for the patient to have repeat CT scan of the chest in 6 months for restaging of her disease. Regarding the multiple family members with history of  lung cancer, I advised the patient to  see genetic counseling for evaluation of her condition. The patient was advised to call immediately if she has any other concerning symptoms in the interval.  The patient voices understanding of current disease status and treatment options and is in agreement with the current care plan.  All questions were answered. The patient knows to call the clinic with any problems, questions or concerns. We can certainly see the patient much sooner if necessary.  Thank you so much for allowing me to participate in the care of [facility]. I will continue to follow up the patient with you and assist in her care.  The total time spent in the appointment was 60 minutes.  Disclaimer: This note was dictated with voice recognition software. Similar sounding words can inadvertently be transcribed and may not be corrected upon review.   Eilleen Kempf March 27, 2021, 1:59 PM

## 2021-03-27 NOTE — Telephone Encounter (Signed)
Scheduled follow-up appointments per 4/27 los. Patient is aware.

## 2021-03-28 ENCOUNTER — Encounter: Payer: Self-pay | Admitting: *Deleted

## 2021-03-28 ENCOUNTER — Telehealth: Payer: Self-pay | Admitting: Internal Medicine

## 2021-03-28 DIAGNOSIS — C3491 Malignant neoplasm of unspecified part of right bronchus or lung: Secondary | ICD-10-CM

## 2021-03-28 DIAGNOSIS — M25561 Pain in right knee: Secondary | ICD-10-CM | POA: Diagnosis not present

## 2021-03-28 NOTE — Telephone Encounter (Signed)
Scheduled appt per 4/28 sch msg. Pt aware.

## 2021-03-28 NOTE — Progress Notes (Signed)
Per Dr. Julien Nordmann referral to genetics completed.

## 2021-04-03 DIAGNOSIS — M25561 Pain in right knee: Secondary | ICD-10-CM | POA: Diagnosis not present

## 2021-04-04 ENCOUNTER — Inpatient Hospital Stay: Payer: Medicare Other | Attending: Hematology and Oncology | Admitting: Genetic Counselor

## 2021-04-04 ENCOUNTER — Inpatient Hospital Stay: Payer: Medicare Other

## 2021-04-04 ENCOUNTER — Other Ambulatory Visit: Payer: Self-pay | Admitting: Genetic Counselor

## 2021-04-04 ENCOUNTER — Encounter: Payer: Self-pay | Admitting: Genetic Counselor

## 2021-04-04 ENCOUNTER — Other Ambulatory Visit: Payer: Self-pay

## 2021-04-04 DIAGNOSIS — C50111 Malignant neoplasm of central portion of right female breast: Secondary | ICD-10-CM

## 2021-04-04 DIAGNOSIS — C3491 Malignant neoplasm of unspecified part of right bronchus or lung: Secondary | ICD-10-CM | POA: Diagnosis not present

## 2021-04-04 DIAGNOSIS — Z801 Family history of malignant neoplasm of trachea, bronchus and lung: Secondary | ICD-10-CM | POA: Diagnosis not present

## 2021-04-04 DIAGNOSIS — Z171 Estrogen receptor negative status [ER-]: Secondary | ICD-10-CM

## 2021-04-04 DIAGNOSIS — Z803 Family history of malignant neoplasm of breast: Secondary | ICD-10-CM

## 2021-04-04 HISTORY — DX: Family history of malignant neoplasm of breast: Z80.3

## 2021-04-04 HISTORY — DX: Family history of malignant neoplasm of trachea, bronchus and lung: Z80.1

## 2021-04-04 NOTE — Addendum Note (Signed)
Addended by: Ignacia Bayley on: 04/04/2021 02:08 PM   Modules accepted: Orders

## 2021-04-04 NOTE — Progress Notes (Signed)
REFERRING PROVIDER: Curt Bears, [place],  [place] [address]  PRIMARY PROVIDER:  Leighton Ruff, MD  PRIMARY REASON FOR VISIT:  1. Adenocarcinoma of lung, stage 1, right ([place])   2. Malignant neoplasm of central portion of right breast in female, estrogen receptor negative ([place])   3. Family history of breast cancer   4. Family history of lung cancer      HISTORY OF PRESENT ILLNESS:   Jessica Wells, a 75 y.o. female, was seen for a [facility] cancer genetics consultation at the request of Dr. Julien Nordmann due to a personal and family history of cancer.  Jessica Wells presents to clinic today to discuss the possibility of a hereditary predisposition to cancer, to discuss genetic testing, and to further clarify her future cancer risks, as well as potential cancer risks for family members.   In 2013, at the age of 87, Jessica Wells was diagnosed with inflammatory breast cancer.  At the age of 1, she was also diagnosed with adenocarcinoma of the lung.    CANCER HISTORY:  Oncology History  Cancer of central portion of right female breast ([place])  07/14/2012 Mammogram   And invasive mammary carcinoma with lymphovascular invasion grade 3 ER negative PR negative HER-2 positive inflammatory breast cancer   07/19/2012 Breast MRI   Right breast neoplasm level I and right axillary lymph node compatible with lymph node metastases biopsy proven to be invasive mammary cancer   07/23/2012 - 12/17/2012 Neo-Adjuvant Chemotherapy   Neoadjuvant FEC 100 followed by Taxol Herceptin due to neuropathy Taxol was replaced by gemcitabine x5 cycles   12/07/2012 -  Hospital Admission   Bilateral lower extremity DVT, workup showed prothrombin gene mutation heterozygous and IVC filter, bilateral pulmonary emboli and acute respiratory failure requiring vent support bronchoscopy and BAL concerning for adenocarcinoma, PET/CT negative   02/08/2013 Surgery   Right mastectomy showed complete response to  chemotherapy in the breast and 11 lymph nodes negative   02/25/2013 - 10/14/2013 Chemotherapy   Herceptin maintenance for one year   03/31/2013 - 05/17/2013 Radiation Therapy   Adjuvant radiation therapy    05/10/2014 Surgery   Left mastectomy   09/29/2018 PET scan   No significant PET uptake in the subcentimeter solid right upper lobe lung nodule measuring 6 mm on the current study     RISK FACTORS:  Menarche was at age 25 or 36.  First live birth at age 35.  OCP use for approximately 5 years.  Ovaries intact: yes.  Hysterectomy: no.  Menopausal status: postmenopausal.  HRT use: 3 years; low dose Colonoscopy: yes; approx 10 years ago per patient. Mammogram within the last year: no; see history noted above (prior right and left mastectomies) Any excessive radiation exposure in the past: no  Past Medical History:  Diagnosis Date  . Allergic reaction to alpha-gal   . Allergy   . Anemia    "after chemo"  . Arthritis    "knees; fingers; occasionally" (05/10/2014)  . Asthma    Uses Inhalers Proventil as needed  . Breast cancer ([facility]) 07/14/12   inflammatory right breast ca, ER/PR -  . Bursitis    "both shoulders"  . Clotting disorder ([facility])    prothrombin gene mutation-heterozygous   . DVT (deep venous thrombosis) ([facility]) 12/22/2012   BLE  . Dysrhythmia    PAT-sees dr Lina Sayre meds  . Family history of breast cancer 04/04/2021  . Family history of lung cancer 04/04/2021  . GERD (gastroesophageal reflux disease)   .  H/O hiatal hernia   . History of blood transfusion 01-25-13   2 units 01-11-13 ("after chem")  . History of chemotherapy    Last dose to be 02-04-13.  Was rx'd with Herceptin & Gemzar  . Lymphedema    RUE  . Osteopenia    "lower spine only" (05/10/2014)  . PAT (paroxysmal atrial tachycardia) (H[facility])    hx  . PE (pulmonary thromboembolism) ([place] [facility]) 12/27/2012  . Pneumonia 01-25-13   hx. 12-27-12-hospital stay x 9 days, now resolved.  . Pneumonia    "episodic; all my life"  (05/10/2014)  . PONV (postoperative nausea and vomiting) 09-13-12   severe, with Port-a-cath, was managed without PONV  . S/P radiation therapy 4-12 wks ago 03/31/13-05/17/13   right breast/supraclavicular fossa/posterior axillary boost    Past Surgical History:  Procedure Laterality Date  . BREAST BIOPSY Right 2013 X 3  . CHALAZION EXCISION Right 1980's   right( MD office)  . COLONOSCOPY    . DILATION AND CURETTAGE OF UTERUS  1998 X 3  . INSERTION OF VENA CAVA FILTER  12/2012  . INTERCOSTAL NERVE BLOCK Right 01/30/2021   Procedure: INTERCOSTAL NERVE BLOCK;  Surgeon: [name], MD;  Location: [facility];  Service: Thoracic;  Laterality: Right;  . MASTECTOMY Left 05/10/2014   PROPHYLACTIC   . MASTECTOMY MODIFIED RADICAL Right 02/08/2013   Procedure: MASTECTOMY MODIFIED RADICAL;  Surgeon: Rolm Bookbinder, MD;  Location: [facility];  Service: General;  Laterality: Right;  . MASTECTOMY MODIFIED RADICAL Right   . NODE DISSECTION Right 01/30/2021   Procedure: NODE DISSECTION;  Surgeon: [name], MD;  Location: [facility];  Service: Thoracic;  Laterality: Right;  . NODE DISSECTION (Right)  01/30/2021  . PORT-A-CATH REMOVAL  05/10/2014  . PORT-A-CATH REMOVAL N/A 05/10/2014   Procedure: REMOVAL PORT-A-CATH;  Surgeon: Rolm Bookbinder, MD;  Location: [facility];  Service: General;  Laterality: N/A;  . PORTACATH PLACEMENT  07/21/2012   Procedure: INSERTION PORT-A-CATH;  Surgeon: Rolm Bookbinder, MD;  Location: [facility];  Service: General;  Laterality: Left;/ Replacement done 10'13  . SIMPLE MASTECTOMY WITH AXILLARY SENTINEL NODE BIOPSY Left 05/10/2014   Procedure: PROPHYLACTIC LEFT MASTECTOMY;  Surgeon: Rolm Bookbinder, MD;  Location: [facility];  Service: General;  Laterality: Left;  . TONSILLECTOMY    . TUBAL LIGATION  ~ 1974  . TUMOR EXCISION Right 1970   giant cell, off my forearm"  . VIDEO BRONCHOSCOPY WITH ENDOBRONCHIAL NAVIGATION N/A 01/30/2021   Procedure: VIDEO BRONCHOSCOPY  WITH ENDOBRONCHIAL NAVIGATION FOR TUMOR MARKING;  Surgeon: [name], MD;  Location: [facility];  Service: Thoracic;  Laterality: N/A;  . VIDEO BRONCHOSCOPY WITH ENDOBRONCHIAL NAVIGATION FOR TUMOR MARKING (N/A)  01/30/2021  . XI ROBOTIC ASSISTED THORACOSCOPY- SEGMENTECTOMY Right 01/30/2021   Procedure: XI ROBOTIC ASSISTED THORACOSCOPY-POSTERIOR SEGMENTECTOMY RIGHT UPPER LOBE;  Surgeon: [name], MD;  [facility];  Service: Thoracic;  Laterality: Right;  . XI ROBOTIC ASSISTED THORACOSCOPY-POSTERIOR SEGMENTECTOMY RIGHT UPPER LOBE (Right)  01/30/2021    Social History   Socioeconomic History  . Marital status: Divorced    Spouse name: Not on file  . Number of children: 1  . Years of education: Not on file  . Highest education level: Not on file  Occupational History    Employer: RETIRED    Comment: retired Brewing technologist  Tobacco Use  . Smoking status: Former Smoker    Packs/day: 1.00    Years: 15.00    Pack years: 15.00    Types: Cigarettes  Quit date: 07/21/1979    Years since quitting: 41.7  . Smokeless tobacco: Never Used  Vaping Use  . Vaping Use: Never used  Substance and Sexual Activity  . Alcohol use: No  . Drug use: No  . Sexual activity: Not Currently    Comment: menarche age 44, P 1, menopause age 23, HRT x 3-5 yrs, low dosage  Other Topics Concern  . Not on file  Social History Narrative  . Not on file   Social Determinants of Health   Financial Resource Strain: Not on file  Food Insecurity: Not on file  Transportation Needs: Not on file  Physical Activity: Not on file  Stress: Not on file  Social Connections: Not on file     FAMILY HISTORY:  We obtained a detailed, 4-generation family history.  Significant diagnoses are listed below: Family History  Problem Relation Age of Onset  . Breast cancer Mother 39       lobular breast cancer  . Cancer Maternal Grandmother        unknown type; d. before age 12  . Lung cancer Father 13        adenocarcinoma  . Skin cancer Father        forehead, dx after 68  . Cancer Paternal Uncle        dx in mid 56s with a cancer in the leg, died late 31s; grandpaternal half uncle  . Lung cancer Sister 23       adenocarcinoma    Jessica Wells is unaware of previous family history of genetic testing for hereditary cancer risks. Patient's maternal ancestors are of [place] descent, and paternal ancestors are of [place] descent. There is no reported Ashkenazi Jewish ancestry. There is no known consanguinity.  GENETIC COUNSELING ASSESSMENT: Jessica Wells is a 75 y.o. female with a personal and family history of cancer which is somewhat suggestive of a hereditary cancer syndrome and predisposition to cancer given the presence of breast cancer in multiple generations of the family. We, therefore, discussed and recommended the following at today's visit.   DISCUSSION: We discussed that 5 - 10% of cancer is hereditary, with most cases of hereditary breast cancer associated with mutations in BRCA1/2.  There are other genes that can be associated with hereditary breast cancer syndromes.  These include but are not limited to CHEK2, ATM, and PALB2.  We discussed that testing is beneficial for several reasons including knowing how to follow individuals for their cancer risks and understanding if other family members could be at risk for cancer and allowing them to undergo genetic testing.   We reviewed the characteristics, features and inheritance patterns of hereditary cancer syndromes. We also discussed genetic testing, including the appropriate family members to test, the process of testing, insurance coverage and turn-around-time for results. We discussed the implications of a negative, positive, carrier and/or variant of uncertain significant result. We recommended Jessica Wells pursue genetic testing for a panel that includes genes associated with breast cancer.   Jessica Wells  was offered a common hereditary cancer  panel (47 genes) and an expanded pan-cancer panel (84 genes). Jessica Wells was informed of the benefits and limitations of each panel, including that expanded pan-cancer panels contain genes that do not have clear management guidelines at this point in time.  We also discussed that as the number of genes included on a panel increases, the chances of variants of uncertain significance increases.  After considering the benefits and limitations of each gene panel,  Jessica Wells  elected to have an expanded pan-cancer panel through [facility].  The Multi-Cancer + RNA Panel offered by [facility] includes sequencing and/or deletion/duplication analysis of the following 84 genes:  AIP*, ALK, APC*, ATM*, AXIN2*, BAP1*, BARD1*, BLM*, BMPR1A*, BRCA1*, BRCA2*, BRIP1*, CASR, CDC73*, CDH1*, CDK4, CDKN1B*, CDKN1C*, CDKN2A, CEBPA, CHEK2*, CTNNA1*, DICER1*, DIS3L2*, EGFR, EPCAM, FH*, FLCN*, GATA2*, GPC3, GREM1, HOXB13, HRAS, KIT, MAX*, MEN1*, MET, MITF, MLH1*, MSH2*, MSH3*, MSH6*, MUTYH*, NBN*, NF1*, NF2*, NTHL1*, PALB2*, PDGFRA, PHOX2B, PMS2*, POLD1*, POLE*, POT1*, PRKAR1A*, PTCH1*, PTEN*, RAD50*, RAD51C*, RAD51D*, RB1*, RECQL4, RET, RUNX1*, SDHA*, SDHAF2*, SDHB*, SDHC*, SDHD*, SMAD4*, SMARCA4*, SMARCB1*, SMARCE1*, STK11*, SUFU*, TERC, TERT, TMEM127*, Tp53*, TSC1*, TSC2*, VHL*, WRN*, and WT1.  RNA analysis is performed for * genes.  Based on Jessica Wells's personal and family history of breast cancer, she meets medical criteria for genetic testing. Despite that she meets criteria, she may still have an out of pocket cost.   PLAN: After considering the risks, benefits, and limitations, Jessica Wells provided informed consent to pursue genetic testing.  She wishes to add genetics labs onto her next lab scheduled at the [facility] in October.  Once collected in October, the blood sample was sent to [facility] for analysis of the Multi-Cancer +RNA Panel. Results should be available within approximately 3 weeks after sample  collection, at which point they will be disclosed by telephone to Jessica Wells, as will any additional recommendations warranted by these results. Jessica Wells will receive a summary of her genetic counseling visit and a copy of her results once available. This information will also be available in Epic.   Based on Jessica Wells's family history, we recommended her niece, who was diagnosed with breast cancer at age 15, have genetic counseling and testing. Jessica Wells will let us know if we can be of any assistance in coordinating genetic counseling and/or testing for this family member.   Lastly, we encouraged Jessica Wells to remain in contact with cancer genetics annually so that we can continuously update the family history and inform her of any changes in cancer genetics and testing that may be of benefit for this family.   Jessica Wells questions were answered to her satisfaction today. Our contact information was provided should additional questions or concerns arise. Thank you for the referral and allowing [place] to share in the care of your patient.   Gatlyn Lipari M. Joette Catching, [place], [facility] Genetic Counselor [email or web address] (P) [phone]  The patient was seen for a total of 40 minutes in face-to-face genetic counseling.  Drs. Magrinat, Lindi Adie and/or Burr Medico were available to discuss this case as needed.    _______________________________________________________________________ For Office Staff:  Number of people involved in session: 1 Was an Intern/ student involved with case: no

## 2021-04-05 DIAGNOSIS — M25561 Pain in right knee: Secondary | ICD-10-CM | POA: Diagnosis not present

## 2021-04-10 ENCOUNTER — Ambulatory Visit: Payer: Medicare Other | Attending: Internal Medicine

## 2021-04-10 DIAGNOSIS — M25561 Pain in right knee: Secondary | ICD-10-CM | POA: Diagnosis not present

## 2021-04-10 DIAGNOSIS — Z23 Encounter for immunization: Secondary | ICD-10-CM

## 2021-04-10 NOTE — Progress Notes (Signed)
   Covid-19 Vaccination Clinic  Name:  Jessica Wells    MRN: [id number] DOB: Aug 24, 1946  04/10/2021  Jessica Wells was observed post Covid-19 immunization for 15 minutes without incident. She was provided with Vaccine Information Sheet and instruction to access the V-Safe system.   Jessica Wells was instructed to call 911 with any severe reactions post vaccine: Marland Kitchen Difficulty breathing  . Swelling of face and throat  . A fast heartbeat  . A bad rash all over body  . Dizziness and weakness   Immunizations Administered    Name Date Dose VIS Date Route   PFIZER Comrnaty(Gray TOP) Covid-19 Vaccine 04/10/2021  2:15 PM 0.3 mL 11/08/2020 Intramuscular   Manufacturer: [employer]   Lot: [id number]   NDC: [phone]

## 2021-04-15 ENCOUNTER — Other Ambulatory Visit (HOSPITAL_BASED_OUTPATIENT_CLINIC_OR_DEPARTMENT_OTHER): Payer: Self-pay

## 2021-04-15 DIAGNOSIS — Z23 Encounter for immunization: Secondary | ICD-10-CM | POA: Diagnosis not present

## 2021-04-15 MED ORDER — PFIZER-BIONT COVID-19 VAC-TRIS 30 MCG/0.3ML IM SUSP
INTRAMUSCULAR | 0 refills | Status: DC
  Filled 2021-04-15: qty 0.3, 1d supply, fill #0

## 2021-04-16 ENCOUNTER — Other Ambulatory Visit: Payer: Self-pay | Admitting: Cardiology

## 2021-04-17 DIAGNOSIS — M25561 Pain in right knee: Secondary | ICD-10-CM | POA: Diagnosis not present

## 2021-04-18 DIAGNOSIS — E7800 Pure hypercholesterolemia, unspecified: Secondary | ICD-10-CM | POA: Diagnosis not present

## 2021-04-18 DIAGNOSIS — E1169 Type 2 diabetes mellitus with other specified complication: Secondary | ICD-10-CM | POA: Diagnosis not present

## 2021-04-18 DIAGNOSIS — C3491 Malignant neoplasm of unspecified part of right bronchus or lung: Secondary | ICD-10-CM | POA: Diagnosis not present

## 2021-04-18 DIAGNOSIS — J4540 Moderate persistent asthma, uncomplicated: Secondary | ICD-10-CM | POA: Diagnosis not present

## 2021-04-18 DIAGNOSIS — Z853 Personal history of malignant neoplasm of breast: Secondary | ICD-10-CM | POA: Diagnosis not present

## 2021-04-18 DIAGNOSIS — M8580 Other specified disorders of bone density and structure, unspecified site: Secondary | ICD-10-CM | POA: Diagnosis not present

## 2021-04-18 DIAGNOSIS — M1711 Unilateral primary osteoarthritis, right knee: Secondary | ICD-10-CM | POA: Diagnosis not present

## 2021-04-18 DIAGNOSIS — E119 Type 2 diabetes mellitus without complications: Secondary | ICD-10-CM | POA: Diagnosis not present

## 2021-04-23 ENCOUNTER — Ambulatory Visit (INDEPENDENT_AMBULATORY_CARE_PROVIDER_SITE_OTHER): Payer: Self-pay | Admitting: Thoracic Surgery (Cardiothoracic Vascular Surgery)

## 2021-04-23 ENCOUNTER — Ambulatory Visit
Admission: RE | Admit: 2021-04-23 | Discharge: 2021-04-23 | Disposition: A | Discharge status: Home / Self Care | Payer: Medicare Other | Source: Ambulatory Visit | Attending: Thoracic Surgery (Cardiothoracic Vascular Surgery) | Admitting: Thoracic Surgery (Cardiothoracic Vascular Surgery)

## 2021-04-23 ENCOUNTER — Encounter: Payer: Self-pay | Admitting: Thoracic Surgery (Cardiothoracic Vascular Surgery)

## 2021-04-23 ENCOUNTER — Other Ambulatory Visit: Payer: Self-pay

## 2021-04-23 ENCOUNTER — Other Ambulatory Visit: Payer: Self-pay | Admitting: Thoracic Surgery (Cardiothoracic Vascular Surgery)

## 2021-04-23 VITALS — BP 130/70 | HR 73 | Resp 20 | Ht 61.0 in | Wt 136.0 lb

## 2021-04-23 DIAGNOSIS — R911 Solitary pulmonary nodule: Secondary | ICD-10-CM

## 2021-04-23 DIAGNOSIS — Z9889 Other specified postprocedural states: Secondary | ICD-10-CM | POA: Diagnosis not present

## 2021-04-23 DIAGNOSIS — R918 Other nonspecific abnormal finding of lung field: Secondary | ICD-10-CM | POA: Diagnosis not present

## 2021-04-23 NOTE — Progress Notes (Signed)
[address]       [place] [address]             [phone]    HPI: Jessica Wells returns for a scheduled follow-up visit  Jessica Wells is a 75 year old woman with a history of breast cancer, DVT, prothrombin gene mutation, paroxysmal atrial tachycardia, alpha gal, asthma, arthritis, reflux, and osteopenia.  She had a remote light history of smoking before quitting in 1980.  She had inflammatory breast cancer in 2013.  On a follow-up scan for that she was found to have a slowly enlarging right upper lobe lung nodule.  I did a right upper lobe posterior segmentectomy on 01/30/2021.  The nodule was a stage Ia (T1, N0) adenocarcinoma.  She did well postoperatively and went home on day 3.  I last saw in the office on 02/19/2021.  She was doing well at that time.  She continues to do well.  She feels like she is about back to her baseline.  She does still have an occasional twinge of pain on that side but she is not taking any medication for it.  She has seen Dr. Lindi Adie and also saw Dr. Julien Nordmann.  She does not need any adjuvant therapy.  Past Medical History:  Diagnosis Date  . Allergic reaction to alpha-gal   . Allergy   . Anemia    "after chemo"  . Arthritis    "knees; fingers; occasionally" (05/10/2014)  . Asthma    Uses Inhalers Proventil as needed  . Breast cancer ([place]) 07/14/12   inflammatory right breast ca, ER/PR -  . Bursitis    "both shoulders"  . Clotting disorder ([place])    prothrombin gene mutation-heterozygous   . DVT (deep venous thrombosis) ([place]) 12/22/2012   BLE  . Dysrhythmia    PAT-sees dr Lina Sayre meds  . Family history of breast cancer 04/04/2021  . Family history of lung cancer 04/04/2021  . GERD (gastroesophageal reflux disease)   . H/O hiatal hernia   . History of blood transfusion 01-25-13   2 units 01-11-13 ("after chem")  . History of chemotherapy    Last dose to be 02-04-13.  Was rx'd with Herceptin & Gemzar  . Lymphedema    RUE  .  Osteopenia    "lower spine only" (05/10/2014)  . PAT (paroxysmal atrial tachycardia) (H[facility])    hx  . PE (pulmonary thromboembolism) ([place]) 12/27/2012  . Pneumonia 01-25-13   hx. 12-27-12-hospital stay x 9 days, now resolved.  . Pneumonia    "episodic; all my life" (05/10/2014)  . PONV (postoperative nausea and vomiting) 09-13-12   severe, with Port-a-cath, was managed without PONV  . S/P radiation therapy 4-12 wks ago 03/31/13-05/17/13   right breast/supraclavicular fossa/posterior axillary boost    Current Outpatient Medications  Medication Sig Dispense Refill  . albuterol (VENTOLIN HFA) 108 (90 Base) MCG/ACT inhaler INHALE 4 PUFFS BY MOUTH EVERY 4 TO 6 HOURS AS NEEDED FOR COUGH AND FOR WHEEZING .USE  WITH  SPACER. 18 g 0  . atorvastatin (LIPITOR) 10 MG tablet Take 10 mg by mouth daily.    . B Complex-C (SUPER B COMPLEX PO) Take 1 tablet by mouth at bedtime.     . calcium-vitamin D (OSCAL WITH D) 500-200 MG-UNIT per tablet Take 1 tablet by mouth every other day.     Marland Kitchen COVID-19 mRNA Vac-TriS, Pfizer, (PFIZER-BIONT COVID-19 VAC-TRIS) SUSP injection Inject into the muscle. 0.3 mL 0  . COVID-19 mRNA vaccine,  Pfizer, 30 MCG/0.3ML injection INJECT AS DIRECTED .3 mL 0  . EPINEPHrine 0.3 mg/0.3 mL IJ SOAJ injection Use as directed for severe allergic reaction 2 each 1  . fluticasone (FLONASE) 50 MCG/ACT nasal spray Place 1 spray into both nostrils as needed for allergies or rhinitis. 16 g 5  . glucosamine-chondroitin 500-400 MG tablet Take 1 tablet by mouth at bedtime.     Marland Kitchen lisinopril (PRINIVIL,ZESTRIL) 2.5 MG tablet Take 2.5 mg by mouth every evening.     . Magnesium 250 MG TABS Take 250 mg by mouth at bedtime as needed (leg cramps).    . metoprolol succinate (TOPROL-XL) 50 MG 24 hr tablet Take 1 tablet by mouth once daily 90 tablet 3  . Multiple Vitamin (MULTIVITAMIN WITH MINERALS) TABS Take 1 tablet by mouth every other day.     Marland Kitchen omeprazole (PRILOSEC) 20 MG capsule Take 1 capsule (20 mg total) by  mouth daily. For heartburn 30 capsule 5  . SYMBICORT 160-4.5 MCG/ACT inhaler INHALE 2 PUFFS BY MOUTH TWICE DAILY .USE  WITH  SPACER. (Patient taking differently: Inhale 1 puff into the lungs daily.) 11 g 0  . XARELTO 20 MG TABS tablet TAKE 1 TABLET BY MOUTH ONCE DAILY WITH SUPPER 90 tablet 0   No current facility-administered medications for this visit.    Physical Exam BP 130/70   Pulse 73   Resp 20   Ht 5\' 1"  (1.549 m)   Wt 136 lb (61.7 kg)   LMP  (LMP Unknown) Comment: tubal ligation  SpO2 97% Comment: RA  BMI 25.34 kg/m  75 year old woman in no acute distress Alert and oriented x3 with no focal deficits Lungs clear with equal breath sounds bilaterally Cardiac regular rate and rhythm Incisions well-healed  Diagnostic Tests: I personally reviewed her chest x-ray.  Shows postoperative changes.  No active disease.  Impression: Jessica Wells is a 75 year old woman with a history of breast cancer, DVT, prothrombin gene mutation, paroxysmal atrial tachycardia, alpha gal, asthma, arthritis, reflux, and osteopenia.  She had a remote light history of smoking before quitting in 1980.  She was found to have a lung nodule on a CT was done for follow-up of inflammatory breast cancer.  The nodule slowly enlarged over time.  I did a right upper lobe posterior segmentectomy on 01/30/2021.  The nodule turned out to be a stage Ia adenocarcinoma of the lung.  She did not require adjuvant therapy.  She is now about 3 months out from surgery.  She is doing well.  She has an occasional twinge of pain but overall is about back to her baseline.  She will be followed by oncology.  She currently has appointments with Dr. Jana Hakim, Dr. Lindi Adie, and Dr. Julien Nordmann over the next 3 to 4 months.  Plan: Follow-up with oncology (Magrinat, Thea Silversmith) I will be happy to see [name] back anytime in the future if I can be of any further assistance with her care  [name], MD [facility] [phone]

## 2021-04-30 DIAGNOSIS — M1711 Unilateral primary osteoarthritis, right knee: Secondary | ICD-10-CM | POA: Diagnosis not present

## 2021-05-02 DIAGNOSIS — M25561 Pain in right knee: Secondary | ICD-10-CM | POA: Diagnosis not present

## 2021-05-17 ENCOUNTER — Telehealth: Payer: Self-pay

## 2021-05-17 NOTE — Telephone Encounter (Signed)
Pt LM stating she was contacted to schedule a CT scan and understood that she was not to have the CT for 16mo and wants to confirm this.  I have reviewed pts orders and see that she has a CT from Dr. Lindi Adie, not Dr. Julien Nordmann.  I have attempted to call the pt back to advise of this but there was no answer and no voicemail picked up.

## 2021-05-17 NOTE — Telephone Encounter (Signed)
Pt called and asked what she would need to do going forward regarding a CT, as Dr Julien Nordmann recommended a CT in October 2022, and Dr Lindi Adie recommends CT in July 2022. Pt would like for Dr Lindi Adie and Dr Julien Nordmann to come to a general consensus regarding her scans. Informed pt I would relay message to Dr Lindi Adie and someone would be in touch to clarify. Please advise.

## 2021-05-20 ENCOUNTER — Telehealth: Payer: Self-pay | Admitting: *Deleted

## 2021-05-20 ENCOUNTER — Other Ambulatory Visit: Payer: Self-pay | Admitting: *Deleted

## 2021-05-20 NOTE — Telephone Encounter (Signed)
Spoke with patient. Dr Lindi Adie wants her to follow Dr Worthy Flank recommendation. Pt states Dr Julien Nordmann suggested CT in October before he see her.  RN will cancel CT ordered by Dr Lindi Adie

## 2021-05-27 ENCOUNTER — Other Ambulatory Visit: Payer: Self-pay | Admitting: Allergy & Immunology

## 2021-06-05 ENCOUNTER — Telehealth: Payer: Self-pay | Admitting: Oncology

## 2021-06-05 NOTE — Telephone Encounter (Signed)
Cancelled appt per 7/6 sch msg. Pt declined to r/s at this time. Pt has MD f/u with both Dr. Julien Nordmann and Dr. Lindi Adie later in the year. She said she will keep those appts.

## 2021-06-06 ENCOUNTER — Ambulatory Visit: Payer: Medicare Other | Admitting: Oncology

## 2021-06-07 ENCOUNTER — Other Ambulatory Visit: Payer: Self-pay | Admitting: Hematology and Oncology

## 2021-06-07 DIAGNOSIS — C50111 Malignant neoplasm of central portion of right female breast: Secondary | ICD-10-CM

## 2021-06-12 DIAGNOSIS — M8580 Other specified disorders of bone density and structure, unspecified site: Secondary | ICD-10-CM | POA: Diagnosis not present

## 2021-06-12 DIAGNOSIS — E119 Type 2 diabetes mellitus without complications: Secondary | ICD-10-CM | POA: Diagnosis not present

## 2021-06-12 DIAGNOSIS — E1169 Type 2 diabetes mellitus with other specified complication: Secondary | ICD-10-CM | POA: Diagnosis not present

## 2021-06-12 DIAGNOSIS — M1711 Unilateral primary osteoarthritis, right knee: Secondary | ICD-10-CM | POA: Diagnosis not present

## 2021-06-12 DIAGNOSIS — J4540 Moderate persistent asthma, uncomplicated: Secondary | ICD-10-CM | POA: Diagnosis not present

## 2021-06-12 DIAGNOSIS — E7800 Pure hypercholesterolemia, unspecified: Secondary | ICD-10-CM | POA: Diagnosis not present

## 2021-07-30 ENCOUNTER — Other Ambulatory Visit (HOSPITAL_BASED_OUTPATIENT_CLINIC_OR_DEPARTMENT_OTHER): Payer: Self-pay

## 2021-08-16 ENCOUNTER — Other Ambulatory Visit (HOSPITAL_COMMUNITY): Payer: Self-pay

## 2021-08-19 DIAGNOSIS — H903 Sensorineural hearing loss, bilateral: Secondary | ICD-10-CM | POA: Diagnosis not present

## 2021-08-25 NOTE — Assessment & Plan Note (Signed)
We will arrange for a PET scan We will perform full pulmonary function testing Follow-up next available with PFT.  We will review your studies at that time and decide next steps in evaluation of your pulmonary nodule.

## 2021-08-25 NOTE — Assessment & Plan Note (Signed)
Okay to continue Symbicort 1 puff once daily for now.  Rinse and gargle after you use it. Keep your albuterol available to use 2 puffs if needed for shortness of breath

## 2021-09-02 DIAGNOSIS — Z7901 Long term (current) use of anticoagulants: Secondary | ICD-10-CM | POA: Diagnosis not present

## 2021-09-02 DIAGNOSIS — E1169 Type 2 diabetes mellitus with other specified complication: Secondary | ICD-10-CM | POA: Diagnosis not present

## 2021-09-02 DIAGNOSIS — J45909 Unspecified asthma, uncomplicated: Secondary | ICD-10-CM | POA: Diagnosis not present

## 2021-09-02 DIAGNOSIS — C3491 Malignant neoplasm of unspecified part of right bronchus or lung: Secondary | ICD-10-CM | POA: Diagnosis not present

## 2021-09-02 DIAGNOSIS — I471 Supraventricular tachycardia: Secondary | ICD-10-CM | POA: Diagnosis not present

## 2021-09-02 DIAGNOSIS — E7800 Pure hypercholesterolemia, unspecified: Secondary | ICD-10-CM | POA: Diagnosis not present

## 2021-09-02 DIAGNOSIS — D6852 Prothrombin gene mutation: Secondary | ICD-10-CM | POA: Diagnosis not present

## 2021-09-02 DIAGNOSIS — Z86711 Personal history of pulmonary embolism: Secondary | ICD-10-CM | POA: Diagnosis not present

## 2021-09-06 ENCOUNTER — Other Ambulatory Visit: Payer: Self-pay | Admitting: Hematology and Oncology

## 2021-09-06 DIAGNOSIS — C50111 Malignant neoplasm of central portion of right female breast: Secondary | ICD-10-CM

## 2021-09-11 ENCOUNTER — Other Ambulatory Visit: Payer: Self-pay | Admitting: Allergy & Immunology

## 2021-09-11 DIAGNOSIS — E7800 Pure hypercholesterolemia, unspecified: Secondary | ICD-10-CM | POA: Diagnosis not present

## 2021-09-11 DIAGNOSIS — M1711 Unilateral primary osteoarthritis, right knee: Secondary | ICD-10-CM | POA: Diagnosis not present

## 2021-09-11 DIAGNOSIS — M8580 Other specified disorders of bone density and structure, unspecified site: Secondary | ICD-10-CM | POA: Diagnosis not present

## 2021-09-11 DIAGNOSIS — J45909 Unspecified asthma, uncomplicated: Secondary | ICD-10-CM | POA: Diagnosis not present

## 2021-09-11 DIAGNOSIS — E1169 Type 2 diabetes mellitus with other specified complication: Secondary | ICD-10-CM | POA: Diagnosis not present

## 2021-09-11 DIAGNOSIS — J4540 Moderate persistent asthma, uncomplicated: Secondary | ICD-10-CM | POA: Diagnosis not present

## 2021-09-11 DIAGNOSIS — E119 Type 2 diabetes mellitus without complications: Secondary | ICD-10-CM | POA: Diagnosis not present

## 2021-09-20 ENCOUNTER — Ambulatory Visit (HOSPITAL_COMMUNITY)
Admission: RE | Admit: 2021-09-20 | Discharge: 2021-09-20 | Disposition: A | Discharge status: Home / Self Care | Payer: Medicare Other | Source: Ambulatory Visit | Attending: Hematology and Oncology | Admitting: Hematology and Oncology

## 2021-09-20 ENCOUNTER — Other Ambulatory Visit: Payer: Self-pay

## 2021-09-20 ENCOUNTER — Inpatient Hospital Stay: Payer: Medicare Other | Attending: Internal Medicine

## 2021-09-20 DIAGNOSIS — C3490 Malignant neoplasm of unspecified part of unspecified bronchus or lung: Secondary | ICD-10-CM | POA: Insufficient documentation

## 2021-09-20 DIAGNOSIS — C3411 Malignant neoplasm of upper lobe, right bronchus or lung: Secondary | ICD-10-CM | POA: Insufficient documentation

## 2021-09-20 DIAGNOSIS — I700 Atherosclerosis of aorta: Secondary | ICD-10-CM | POA: Diagnosis not present

## 2021-09-20 DIAGNOSIS — R911 Solitary pulmonary nodule: Secondary | ICD-10-CM | POA: Diagnosis not present

## 2021-09-20 DIAGNOSIS — Z86718 Personal history of other venous thrombosis and embolism: Secondary | ICD-10-CM | POA: Insufficient documentation

## 2021-09-20 DIAGNOSIS — Z7901 Long term (current) use of anticoagulants: Secondary | ICD-10-CM | POA: Insufficient documentation

## 2021-09-20 DIAGNOSIS — Z803 Family history of malignant neoplasm of breast: Secondary | ICD-10-CM

## 2021-09-20 DIAGNOSIS — J45909 Unspecified asthma, uncomplicated: Secondary | ICD-10-CM | POA: Insufficient documentation

## 2021-09-20 DIAGNOSIS — C3491 Malignant neoplasm of unspecified part of right bronchus or lung: Secondary | ICD-10-CM

## 2021-09-20 DIAGNOSIS — C50111 Malignant neoplasm of central portion of right female breast: Secondary | ICD-10-CM | POA: Diagnosis not present

## 2021-09-20 DIAGNOSIS — R918 Other nonspecific abnormal finding of lung field: Secondary | ICD-10-CM | POA: Diagnosis not present

## 2021-09-20 DIAGNOSIS — Z171 Estrogen receptor negative status [ER-]: Secondary | ICD-10-CM

## 2021-09-20 DIAGNOSIS — M8580 Other specified disorders of bone density and structure, unspecified site: Secondary | ICD-10-CM | POA: Insufficient documentation

## 2021-09-20 DIAGNOSIS — K449 Diaphragmatic hernia without obstruction or gangrene: Secondary | ICD-10-CM | POA: Insufficient documentation

## 2021-09-20 DIAGNOSIS — Z801 Family history of malignant neoplasm of trachea, bronchus and lung: Secondary | ICD-10-CM | POA: Insufficient documentation

## 2021-09-20 DIAGNOSIS — K219 Gastro-esophageal reflux disease without esophagitis: Secondary | ICD-10-CM | POA: Insufficient documentation

## 2021-09-20 DIAGNOSIS — I471 Supraventricular tachycardia: Secondary | ICD-10-CM | POA: Insufficient documentation

## 2021-09-20 DIAGNOSIS — I2510 Atherosclerotic heart disease of native coronary artery without angina pectoris: Secondary | ICD-10-CM | POA: Insufficient documentation

## 2021-09-20 LAB — CBC WITH DIFFERENTIAL (CANCER CENTER ONLY)
Abs Immature Granulocytes: 0.01 10*3/uL (ref 0.00–0.07)
Basophils Absolute: 0 10*3/uL (ref 0.0–0.1)
Basophils Relative: 1 %
Eosinophils Absolute: 0.2 10*3/uL (ref 0.0–0.5)
Eosinophils Relative: 3 %
HCT: 37.7 % (ref 36.0–46.0)
Hemoglobin: 12.9 g/dL (ref 12.0–15.0)
Immature Granulocytes: 0 %
Lymphocytes Relative: 37 %
Lymphs Abs: 1.8 10*3/uL (ref 0.7–4.0)
MCH: 31 pg (ref 26.0–34.0)
MCHC: 34.2 g/dL (ref 30.0–36.0)
MCV: 90.6 fL (ref 80.0–100.0)
Monocytes Absolute: 0.5 10*3/uL (ref 0.1–1.0)
Monocytes Relative: 10 %
Neutro Abs: 2.4 10*3/uL (ref 1.7–7.7)
Neutrophils Relative %: 49 %
Platelet Count: 152 10*3/uL (ref 150–400)
RBC: 4.16 MIL/uL (ref 3.87–5.11)
RDW: 12.1 % (ref 11.5–15.5)
WBC Count: 4.9 10*3/uL (ref 4.0–10.5)
nRBC: 0 % (ref 0.0–0.2)

## 2021-09-20 LAB — CMP (CANCER CENTER ONLY)
ALT: 18 U/L (ref 0–44)
AST: 18 U/L (ref 15–41)
Albumin: 4.3 g/dL (ref 3.5–5.0)
Alkaline Phosphatase: 50 U/L (ref 38–126)
Anion gap: 7 (ref 5–15)
BUN: 25 mg/dL — ABNORMAL HIGH (ref 8–23)
CO2: 28 mmol/L (ref 22–32)
Calcium: 9.7 mg/dL (ref 8.9–10.3)
Chloride: 104 mmol/L (ref 98–111)
Creatinine: 0.93 mg/dL (ref 0.44–1.00)
GFR, Estimated: >60 mL/min (ref >60)
Glucose, Bld: 129 mg/dL — ABNORMAL HIGH (ref 70–99)
Potassium: 4.6 mmol/L (ref 3.5–5.1)
Sodium: 139 mmol/L (ref 135–145)
Total Bilirubin: 0.8 mg/dL (ref 0.3–1.2)
Total Protein: 7.1 g/dL (ref 6.5–8.1)

## 2021-09-20 LAB — GENETIC SCREENING ORDER

## 2021-09-20 MED ORDER — IOHEXOL 350 MG/ML SOLN
60.0000 mL | Freq: Once | INTRAVENOUS | Status: AC | PRN
  Administered 2021-09-20: 60 mL via INTRAVENOUS

## 2021-09-26 ENCOUNTER — Other Ambulatory Visit: Payer: Self-pay

## 2021-09-26 ENCOUNTER — Inpatient Hospital Stay (HOSPITAL_BASED_OUTPATIENT_CLINIC_OR_DEPARTMENT_OTHER): Payer: Medicare Other | Admitting: Internal Medicine

## 2021-09-26 VITALS — BP 139/65 | HR 57 | Temp 97.9°F | Resp 18 | Ht 61.0 in | Wt 136.4 lb

## 2021-09-26 DIAGNOSIS — Z801 Family history of malignant neoplasm of trachea, bronchus and lung: Secondary | ICD-10-CM | POA: Diagnosis not present

## 2021-09-26 DIAGNOSIS — Z7901 Long term (current) use of anticoagulants: Secondary | ICD-10-CM | POA: Diagnosis not present

## 2021-09-26 DIAGNOSIS — J45909 Unspecified asthma, uncomplicated: Secondary | ICD-10-CM | POA: Diagnosis not present

## 2021-09-26 DIAGNOSIS — K219 Gastro-esophageal reflux disease without esophagitis: Secondary | ICD-10-CM | POA: Diagnosis not present

## 2021-09-26 DIAGNOSIS — C3490 Malignant neoplasm of unspecified part of unspecified bronchus or lung: Secondary | ICD-10-CM

## 2021-09-26 DIAGNOSIS — C3491 Malignant neoplasm of unspecified part of right bronchus or lung: Secondary | ICD-10-CM | POA: Diagnosis not present

## 2021-09-26 DIAGNOSIS — Z86718 Personal history of other venous thrombosis and embolism: Secondary | ICD-10-CM | POA: Diagnosis not present

## 2021-09-26 DIAGNOSIS — M8580 Other specified disorders of bone density and structure, unspecified site: Secondary | ICD-10-CM | POA: Diagnosis not present

## 2021-09-26 DIAGNOSIS — I700 Atherosclerosis of aorta: Secondary | ICD-10-CM | POA: Diagnosis not present

## 2021-09-26 DIAGNOSIS — K449 Diaphragmatic hernia without obstruction or gangrene: Secondary | ICD-10-CM | POA: Diagnosis not present

## 2021-09-26 DIAGNOSIS — I2510 Atherosclerotic heart disease of native coronary artery without angina pectoris: Secondary | ICD-10-CM | POA: Diagnosis not present

## 2021-09-26 DIAGNOSIS — Z803 Family history of malignant neoplasm of breast: Secondary | ICD-10-CM | POA: Diagnosis not present

## 2021-09-26 DIAGNOSIS — I471 Supraventricular tachycardia: Secondary | ICD-10-CM | POA: Diagnosis not present

## 2021-09-26 DIAGNOSIS — C3411 Malignant neoplasm of upper lobe, right bronchus or lung: Secondary | ICD-10-CM | POA: Diagnosis not present

## 2021-09-26 NOTE — Progress Notes (Signed)
[facility] [phone]   [phone]  OFFICE PROGRESS NOTE  Rankins, Bill Salinas, MD [place] [address]  DIAGNOSIS:  1) Stage Ia (T1 a, N0, M0) non-small cell lung cancer, adenocarcinoma presented with right upper lobe nodule  2) A history of inflammatory breast cancer involving the right breast, HR negative and HER2 positive status post neoadjuvant chemotherapy followed by right mastectomy followed by adjuvant therapy and she was followed by Dr. Laurelyn Sickle and Dr. Lindi Adie.Marland Kitchen  PRIOR THERAPY: status post right upper lobe superior segmentectomy with lymph node dissection under the care of Dr. Roxan Hockey on January 30, 2021.  CURRENT THERAPY: Observation.   INTERVAL HISTORY: Jessica Wells 75 y.o. female returns to the clinic today for follow-up visit.  The patient is feeling fine today with no concerning complaints.  She denied having any chest pain, shortness of breath, cough or hemoptysis.  She denied having any fever or chills.  She has no nausea, vomiting, diarrhea or constipation.  She has no headache or visual changes.  She denied having any significant weight loss or night sweats.  She is here today for evaluation with repeat CT scan of the chest for restaging of her disease.  MEDICAL HISTORY: Past Medical History:  Diagnosis Date   Allergic reaction to alpha-gal    Allergy    Anemia    "after chemo"   Arthritis    "knees; fingers; occasionally" (05/10/2014)   Asthma    Uses Inhalers Proventil as needed   Breast cancer ([facility]) 07/14/12   inflammatory right breast ca, ER/PR -   Bursitis    "both shoulders"   Clotting disorder ([facility])    prothrombin gene mutation-heterozygous    DVT (deep venous thrombosis) ([facility]) 12/22/2012   BLE   Dysrhythmia    PAT-sees dr Lina Sayre meds   Family history of breast cancer 04/04/2021   Family history of lung cancer 04/04/2021   GERD (gastroesophageal reflux disease)    H/O hiatal hernia     History of blood transfusion 01-25-13   2 units 01-11-13 ("after chem")   History of chemotherapy    Last dose to be 02-04-13.  Was rx'd with Herceptin & Gemzar   Lymphedema    RUE   Osteopenia    "lower spine only" (05/10/2014)   PAT (paroxysmal atrial tachycardia) ([facility])    hx   PE (pulmonary thromboembolism) ([facility]) 12/27/2012   Pneumonia 01-25-13   hx. 12-27-12-hospital stay x 9 days, now resolved.   Pneumonia    "episodic; all my life" (05/10/2014)   PONV (postoperative nausea and vomiting) 09-13-12   severe, with Port-a-cath, was managed without PONV   S/P radiation therapy 4-12 wks ago 03/31/13-05/17/13   right breast/supraclavicular fossa/posterior axillary boost    ALLERGIES:  is allergic to alpha-gal; anesthetics, amide; bactrim [sulfamethoxazole-trimethoprim]; sulfa antibiotics; anesthesia s-i-40 [propofol]; codeine; codeine phosphate; eggs or egg-derived products; fish allergy; milk-related compounds; and other.  MEDICATIONS:  Current Outpatient Medications  Medication Sig Dispense Refill   SYMBICORT 160-4.5 MCG/ACT inhaler INHALE 2 PUFFS BY MOUTH TWICE DAILY USE WITH SPACER 10 g 2   albuterol (VENTOLIN HFA) 108 (90 Base) MCG/ACT inhaler INHALE 4 PUFFS BY MOUTH EVERY 4 TO 6 HOURS AS NEEDED FOR COUGH AND FOR WHEEZING .USE  WITH  SPACER. 18 g 0   atorvastatin (LIPITOR) 10 MG tablet Take 10 mg by mouth daily.     B Complex-C (SUPER B COMPLEX PO) Take 1 tablet by mouth at  bedtime.      calcium-vitamin D (OSCAL WITH D) 500-200 MG-UNIT per tablet Take 1 tablet by mouth every other day.      COVID-19 mRNA Vac-TriS, Pfizer, (PFIZER-BIONT COVID-19 VAC-TRIS) SUSP injection Inject into the muscle. 0.3 mL 0   COVID-19 mRNA vaccine, Pfizer, 30 MCG/0.3ML injection INJECT AS DIRECTED .3 mL 0   EPINEPHrine 0.3 mg/0.3 mL IJ SOAJ injection Use as directed for severe allergic reaction 2 each 1   fluticasone (FLONASE) 50 MCG/ACT nasal spray Place 1 spray into both nostrils as needed for allergies or  rhinitis. 16 g 5   glucosamine-chondroitin 500-400 MG tablet Take 1 tablet by mouth at bedtime.      lisinopril (PRINIVIL,ZESTRIL) 2.5 MG tablet Take 2.5 mg by mouth every evening.      Magnesium 250 MG TABS Take 250 mg by mouth at bedtime as needed (leg cramps).     metoprolol succinate (TOPROL-XL) 50 MG 24 hr tablet Take 1 tablet by mouth once daily 90 tablet 3   Multiple Vitamin (MULTIVITAMIN WITH MINERALS) TABS Take 1 tablet by mouth every other day.      omeprazole (PRILOSEC) 20 MG capsule Take 1 capsule (20 mg total) by mouth daily. For heartburn 30 capsule 5   XARELTO 20 MG TABS tablet TAKE 1 TABLET BY MOUTH ONCE DAILY WITH SUPPER 90 tablet 3   No current facility-administered medications for this visit.    SURGICAL HISTORY:  Past Surgical History:  Procedure Laterality Date   BREAST BIOPSY Right 2013 X 3   CHALAZION EXCISION Right 1980's   right( MD office)   COLONOSCOPY     DILATION AND CURETTAGE OF UTERUS  1998 X 3   INSERTION OF VENA CAVA FILTER  12/2012   INTERCOSTAL NERVE BLOCK Right 01/30/2021   Procedure: INTERCOSTAL NERVE BLOCK;  Surgeon: [name], MD;  Location: [facility];  Service: Thoracic;  Laterality: Right;   MASTECTOMY Left 05/10/2014   PROPHYLACTIC    MASTECTOMY MODIFIED RADICAL Right 02/08/2013   Procedure: MASTECTOMY MODIFIED RADICAL;  Surgeon: Rolm Bookbinder, MD;  Location: [facility];  Service: General;  Laterality: Right;   MASTECTOMY MODIFIED RADICAL Right    NODE DISSECTION Right 01/30/2021   Procedure: NODE DISSECTION;  Surgeon: [name], MD;  Location: [facility];  Service: Thoracic;  Laterality: Right;   NODE DISSECTION (Right)  01/30/2021   PORT-A-CATH REMOVAL  05/10/2014   PORT-A-CATH REMOVAL N/A 05/10/2014   Procedure: REMOVAL PORT-A-CATH;  Surgeon: Rolm Bookbinder, MD;  Location: [facility];  Service: General;  Laterality: N/A;   PORTACATH PLACEMENT  07/21/2012   Procedure: INSERTION PORT-A-CATH;  Surgeon: Rolm Bookbinder, MD;  Location:  [facility];  Service: General;  Laterality: Left;/ Replacement done 10'13   SIMPLE MASTECTOMY WITH AXILLARY SENTINEL NODE BIOPSY Left 05/10/2014   Procedure: PROPHYLACTIC LEFT MASTECTOMY;  Surgeon: Rolm Bookbinder, MD;  Location: [facility];  Service: General;  Laterality: Left;   TONSILLECTOMY     TUBAL LIGATION  ~ 1974   TUMOR EXCISION Right 1970   giant cell, off my forearm"   [facility] N/A 01/30/2021   Procedure: VIDEO BRONCHOSCOPY WITH ENDOBRONCHIAL NAVIGATION FOR TUMOR MARKING;  Surgeon: [name], MD;  Location: [facility];  Service: Thoracic;  Laterality: N/A;   [place] (N/A)  01/30/2021   XI ROBOTIC ASSISTED THORACOSCOPY- SEGMENTECTOMY Right 01/30/2021   Procedure: XI ROBOTIC ASSISTED THORACOSCOPY-POSTERIOR SEGMENTECTOMY RIGHT UPPER LOBE;  Surgeon: Modesto Charon  C, MD;  Location: [facility];  Service: Thoracic;  Laterality: Right;   XI ROBOTIC ASSISTED THORACOSCOPY-POSTERIOR SEGMENTECTOMY RIGHT UPPER LOBE (Right)  01/30/2021    REVIEW OF SYSTEMS:  A comprehensive review of systems was negative.   PHYSICAL EXAMINATION: General appearance: alert, cooperative, and no distress Head: Normocephalic, without obvious abnormality, atraumatic Neck: no adenopathy, no JVD, supple, symmetrical, trachea midline, and thyroid not enlarged, symmetric, no tenderness/mass/nodules Lymph nodes: Cervical, supraclavicular, and axillary nodes normal. Resp: clear to auscultation bilaterally Back: symmetric, no curvature. ROM normal. No CVA tenderness. Cardio: regular rate and rhythm, S1, S2 normal, no murmur, click, rub or gallop GI: soft, non-tender; bowel sounds normal; no masses,  no organomegaly Extremities: extremities normal, atraumatic, no cyanosis or edema  ECOG PERFORMANCE STATUS: 0 - Asymptomatic  Blood pressure 139/65, pulse (!) 57, temperature 97.9 F (36.6 C), temperature source  Tympanic, resp. rate 18, height 5' 1"  (1.549 m), weight 136 lb 6.4 oz (61.9 kg), SpO2 100 %.  LABORATORY DATA: Lab Results  Component Value Date   WBC 4.9 09/20/2021   HGB 12.9 09/20/2021   HCT 37.7 09/20/2021   MCV 90.6 09/20/2021   PLT 152 09/20/2021      Chemistry      Component Value Date/Time   NA 139 09/20/2021 0813   NA 139 10/15/2017 0811   K 4.6 09/20/2021 0813   K 4.0 10/15/2017 0811   CL 104 09/20/2021 0813   CL 105 05/20/2013 1459   CO2 28 09/20/2021 0813   CO2 27 10/15/2017 0811   BUN 25 (H) 09/20/2021 0813   BUN 25.6 10/15/2017 0811   CREATININE 0.93 09/20/2021 0813   CREATININE 0.9 10/15/2017 0811      Component Value Date/Time   CALCIUM 9.7 09/20/2021 0813   CALCIUM 9.6 10/15/2017 0811   ALKPHOS 50 09/20/2021 0813   ALKPHOS 51 10/15/2017 0811   AST 18 09/20/2021 0813   AST 18 10/15/2017 0811   ALT 18 09/20/2021 0813   ALT 23 10/15/2017 0811   BILITOT 0.8 09/20/2021 0813   BILITOT 0.68 10/15/2017 0811       RADIOGRAPHIC STUDIES: CT Chest W Contrast  Result Date: 09/22/2021 CLINICAL DATA:  75 year old female with history of non-small cell lung cancer (adenocarcinoma) status post surgical resection, radiation therapy and chemotherapy, now complete. Follow-up study. EXAM: CT CHEST WITH CONTRAST TECHNIQUE: Multidetector CT imaging of the chest was performed during intravenous contrast administration. CONTRAST:  28m OMNIPAQUE IOHEXOL 350 MG/ML SOLN COMPARISON:  Chest CT 01/28/2021. FINDINGS: Cardiovascular: Heart size is normal. There is no significant pericardial fluid, thickening or pericardial calcification. There is aortic atherosclerosis, as well as atherosclerosis of the great vessels of the mediastinum and the coronary arteries, including calcified atherosclerotic plaque in the left anterior descending coronary artery. Mediastinum/Nodes: No pathologically enlarged mediastinal, internal mammary or hilar lymph nodes. Small to moderate hiatal hernia. No  axillary lymphadenopathy. Surgical clips in the right axillary region from prior lymph node dissection. Lungs/Pleura: Compared to the prior examination there has been right upper lobe wedge resection. Compensatory hyperexpansion of the remaining portions of the right upper lobe, right middle and right lower lobes. Trace amount of pleural fluid loculated in the periphery of the right upper hemithorax. No acute consolidative airspace disease. No left-sided pleural effusion. Small pulmonary nodules in the left lower lobe measuring 3 mm on axial image 99 of series 7 and 3 mm ground-glass attenuation nodule in the left lower lobe on axial image 82 of series 7, stable compared to  the prior study. No larger more suspicious appearing pulmonary nodules or masses are noted. Upper Abdomen: Unremarkable. Musculoskeletal: Status post right modified radical mastectomy. There are no aggressive appearing lytic or blastic lesions noted in the visualized portions of the skeleton. IMPRESSION: 1. Postoperative changes related to prior right upper lobe superior segmentectomy, with no definitive findings to suggest residual or metastatic disease in the thorax. 2. Small left lower lobe pulmonary nodules, stable compared to the prior study, likely benign. Attention on follow-up imaging is recommended to ensure stability. 3. Aortic atherosclerosis, in addition to left anterior descending coronary artery disease. Please note that although the presence of coronary artery calcium documents the presence of coronary artery disease, the severity of this disease and any potential stenosis cannot be assessed on this non-gated CT examination. Assessment for potential risk factor modification, dietary therapy or pharmacologic therapy may be warranted, if clinically indicated. 4. Additional incidental findings, as above. Aortic Atherosclerosis (ICD10-I70.0). Electronically Signed   By: Vinnie Langton M.D.   On: 09/22/2021 08:18    ASSESSMENT AND  PLAN: This is a very pleasant 75 years old white female diagnosed with a stage Ia (T1 a, N0, M0) non-small cell lung cancer, adenocarcinoma presented with right upper lobe lung nodule status post right upper lobe superior segmentectomy with lymph node dissection under the care of Dr. Roxan Hockey on January 30, 2021. The patient also has a history of inflammatory breast cancer involving the right breast with ER/PR negative and HER2 positive status post neoadjuvant chemotherapy followed by right mastectomy and adjuvant chemotherapy under the care of Dr. Humphrey Rolls and Dr. Lindi Adie. The patient is currently on observation and she is feeling fine. She had repeat CT scan of the chest performed recently.  I personally and independently reviewed the scans and discussed the results with the patient today. Her scan showed no concerning findings for disease recurrence or metastasis. I recommended for her to continue on observation with repeat CT scan of the chest in 6 months. The patient was referred to genetic counseling and she had her blood work done earlier today. She was advised to call immediately if she has any other concerning symptoms in the interval. The patient voices understanding of current disease status and treatment options and is in agreement with the current care plan.  All questions were answered. The patient knows to call the clinic with any problems, questions or concerns. We can certainly see the patient much sooner if necessary.  The total time spent in the appointment was 20 minutes.  Disclaimer: This note was dictated with voice recognition software. Similar sounding words can inadvertently be transcribed and may not be corrected upon review.

## 2021-09-27 ENCOUNTER — Ambulatory Visit: Payer: Medicare Other | Attending: Internal Medicine

## 2021-09-27 DIAGNOSIS — Z23 Encounter for immunization: Secondary | ICD-10-CM

## 2021-09-27 NOTE — Progress Notes (Signed)
   Covid-19 Vaccination Clinic  Name:  Jessica Wells    MRN: [id number] DOB: 12-13-1945  09/27/2021  [name] was observed post Covid-19 immunization for 15 minutes without incident. She was provided with Vaccine Information Sheet and instruction to access the V-Safe system.   [name] was instructed to call 911 with any severe reactions post vaccine: Difficulty breathing  Swelling of face and throat  A fast heartbeat  A bad rash all over body  Dizziness and weakness   Immunizations Administered     Name Date Dose VIS Date Route   Pfizer Covid-19 Vaccine Bivalent Booster 09/27/2021  2:18 PM 0.3 mL 07/31/2021 Intramuscular   Manufacturer: [place]   Lot: [id number]   [place]: [phone]

## 2021-09-30 ENCOUNTER — Telehealth: Payer: Self-pay | Admitting: Internal Medicine

## 2021-09-30 NOTE — Telephone Encounter (Signed)
Scheduled follow-up appointments per 10/27 los. Patient is aware.

## 2021-10-08 ENCOUNTER — Ambulatory Visit: Payer: Self-pay | Admitting: Genetic Counselor

## 2021-10-08 ENCOUNTER — Encounter: Payer: Self-pay | Admitting: Genetic Counselor

## 2021-10-08 ENCOUNTER — Telehealth: Payer: Self-pay | Admitting: Genetic Counselor

## 2021-10-08 DIAGNOSIS — Z171 Estrogen receptor negative status [ER-]: Secondary | ICD-10-CM

## 2021-10-08 DIAGNOSIS — Z801 Family history of malignant neoplasm of trachea, bronchus and lung: Secondary | ICD-10-CM

## 2021-10-08 DIAGNOSIS — C3491 Malignant neoplasm of unspecified part of right bronchus or lung: Secondary | ICD-10-CM

## 2021-10-08 DIAGNOSIS — Z1379 Encounter for other screening for genetic and chromosomal anomalies: Secondary | ICD-10-CM | POA: Insufficient documentation

## 2021-10-08 DIAGNOSIS — Z803 Family history of malignant neoplasm of breast: Secondary | ICD-10-CM

## 2021-10-08 NOTE — Telephone Encounter (Signed)
Revealed negative genetic testing.  Discussed that we do not know why she had breast and lung cancer or why there is cancer in the family. It could be sporadic/familial, due to a different gene that we are not testing, or maybe our current technology may not be able to pick something up.  It will be important for her to keep in contact with genetics to keep up with whether additional testing may be needed.  Recommended genetic testing for niece with breast cancer history.

## 2021-10-08 NOTE — Progress Notes (Signed)
HPI:   Jessica Wells was previously seen in the [facility] due to a personal and family history of cancer and concerns regarding a hereditary predisposition to cancer. Please refer to our prior cancer genetics clinic note for more information regarding our discussion, assessment and recommendations, at the time. Jessica Wells recent genetic test results were disclosed to her, as were recommendations warranted by these results. These results and recommendations are discussed in more detail below.  CANCER HISTORY:    In 2013, at the age of 28, Jessica Wells was diagnosed with inflammatory breast cancer.  At the age of 28, she was also diagnosed with adenocarcinoma of the lung.     Oncology History  Cancer of central portion of right female breast ([place])  07/14/2012 Mammogram   And invasive mammary carcinoma with lymphovascular invasion grade 3 ER negative PR negative HER-2 positive inflammatory breast cancer   07/19/2012 Breast MRI   Right breast neoplasm level I and right axillary lymph node compatible with lymph node metastases biopsy proven to be invasive mammary cancer   07/23/2012 - 12/17/2012 Neo-Adjuvant Chemotherapy   Neoadjuvant FEC 100 followed by Taxol Herceptin due to neuropathy Taxol was replaced by gemcitabine x5 cycles   12/07/2012 -  Hospital Admission   Bilateral lower extremity DVT, workup showed prothrombin gene mutation heterozygous and IVC filter, bilateral pulmonary emboli and acute respiratory failure requiring vent support bronchoscopy and BAL concerning for adenocarcinoma, PET/CT negative   02/08/2013 Surgery   Right mastectomy showed complete response to chemotherapy in the breast and 11 lymph nodes negative   02/25/2013 - 10/14/2013 Chemotherapy   Herceptin maintenance for one year   03/31/2013 - 05/17/2013 Radiation Therapy   Adjuvant radiation therapy    05/10/2014 Surgery   Left mastectomy   09/29/2018 PET scan   No significant PET uptake in the  subcentimeter solid right upper lobe lung nodule measuring 6 mm on the current study   10/07/2021 Genetic Testing   Negative hereditary cancer genetic testing: no pathogenic variants detected in [facility] Multi-Cancer +RNA Panel.  The report date is October 07, 2021.   The Multi-Cancer + RNA Panel offered by [facility] includes sequencing and/or deletion/duplication analysis of the following 84 genes:  AIP*, ALK, APC*, ATM*, AXIN2*, BAP1*, BARD1*, BLM*, BMPR1A*, BRCA1*, BRCA2*, BRIP1*, CASR, CDC73*, CDH1*, CDK4, CDKN1B*, CDKN1C*, CDKN2A, CEBPA, CHEK2*, CTNNA1*, DICER1*, DIS3L2*, EGFR, EPCAM, FH*, FLCN*, GATA2*, GPC3, GREM1, HOXB13, HRAS, KIT, MAX*, MEN1*, MET, MITF, MLH1*, MSH2*, MSH3*, MSH6*, MUTYH*, NBN*, NF1*, NF2*, NTHL1*, PALB2*, PDGFRA, PHOX2B, PMS2*, POLD1*, POLE*, POT1*, PRKAR1A*, PTCH1*, PTEN*, RAD50*, RAD51C*, RAD51D*, RB1*, RECQL4, RET, RUNX1*, SDHA*, SDHAF2*, SDHB*, SDHC*, SDHD*, SMAD4*, SMARCA4*, SMARCB1*, SMARCE1*, STK11*, SUFU*, TERC, TERT, TMEM127*, Tp53*, TSC1*, TSC2*, VHL*, WRN*, and WT1.  RNA analysis is performed for * genes.   Adenocarcinoma of lung, stage 1, right ([place])  02/02/2021 Initial Diagnosis   Adenocarcinoma of lung, stage 1, right ([place])   09/26/2021 Cancer Staging   Staging form: Lung, AJCC 8th Edition - Clinical: Stage IA1 (cT1a, cN0, cM0) - Signed by Curt Bears, MD on 09/26/2021      FAMILY HISTORY:  We obtained a detailed, 4-generation family history.  Significant diagnoses are listed below:      Family History  Problem Relation Age of Onset   Breast cancer Mother 1        lobular breast cancer   Cancer Maternal Grandmother          unknown type; d. before age 35   Lung cancer  Father 49        adenocarcinoma   Skin cancer Father          forehead, dx after 26   Cancer Paternal Uncle          dx in mid 3s with a cancer in the leg, died late 49s; grandpaternal half uncle   Lung cancer Sister 69        adenocarcinoma    Jessica Wells is unaware of  previous family history of genetic testing for hereditary cancer risks. Patient's maternal ancestors are of [place] descent, and paternal ancestors are of [place] descent. There is no reported Ashkenazi Jewish ancestry. There is no known consanguinity.    GENETIC TEST RESULTS:  The [facility] Multi-Cancer +RNA Panel found no pathogenic mutations. The Multi-Cancer + RNA Panel offered by [facility] includes sequencing and/or deletion/duplication analysis of the following 84 genes:  AIP*, ALK, APC*, ATM*, AXIN2*, BAP1*, BARD1*, BLM*, BMPR1A*, BRCA1*, BRCA2*, BRIP1*, CASR, CDC73*, CDH1*, CDK4, CDKN1B*, CDKN1C*, CDKN2A, CEBPA, CHEK2*, CTNNA1*, DICER1*, DIS3L2*, EGFR, EPCAM, FH*, FLCN*, GATA2*, GPC3, GREM1, HOXB13, HRAS, KIT, MAX*, MEN1*, MET, MITF, MLH1*, MSH2*, MSH3*, MSH6*, MUTYH*, NBN*, NF1*, NF2*, NTHL1*, PALB2*, PDGFRA, PHOX2B, PMS2*, POLD1*, POLE*, POT1*, PRKAR1A*, PTCH1*, PTEN*, RAD50*, RAD51C*, RAD51D*, RB1*, RECQL4, RET, RUNX1*, SDHA*, SDHAF2*, SDHB*, SDHC*, SDHD*, SMAD4*, SMARCA4*, SMARCB1*, SMARCE1*, STK11*, SUFU*, TERC, TERT, TMEM127*, Tp53*, TSC1*, TSC2*, VHL*, WRN*, and WT1.  RNA analysis is performed for * genes.  The test report has been scanned into EPIC and is located under the Molecular Pathology section of the Results Review tab.  A portion of the result report is included below for reference. Genetic testing reported out on October 07, 2021.     Even though a pathogenic variant was not identified, possible explanations for the cancer in the family may include: There may be no hereditary risk for cancer in the family. The cancers in Jessica Wells and/or her family may be sporadic/familial or due to other genetic and environmental factors. There may be a gene mutation in one of these genes that current testing methods cannot detect but that chance is small. There could be another gene that has not yet been discovered, or that we have not yet tested, that is responsible for the cancer diagnoses in  the family.  It is also possible there is a hereditary cause for the cancer in the family that Jessica Wells did not inherit.   Therefore, it is important to remain in touch with cancer genetics in the future so that we can continue to offer Jessica Wells the most up to date genetic testing.   ADDITIONAL GENETIC TESTING:  We discussed with Jessica Wells that her genetic testing was fairly extensive.  If there are genes identified to increase cancer risk that can be analyzed in the future, we would be happy to discuss and coordinate this testing at that time.    CANCER SCREENING RECOMMENDATIONS:  Jessica Wells test result is considered negative (normal).  This means that we have not identified a hereditary cause for her personal history of cancer at this time.   An individual's cancer risk and medical management are not determined by genetic test results alone. Overall cancer risk assessment incorporates additional factors, including personal medical history, family history, and any available genetic information that may result in a personalized plan for cancer prevention and surveillance. Therefore, it is recommended she continue to follow the cancer management and screening guidelines provided by her oncology and primary healthcare provider.  RECOMMENDATIONS FOR FAMILY  MEMBERS:   Since she did not inherit a identifiable mutation in a cancer predisposition gene included on this panel, her children could not have inherited a known mutation from her in one of these genes. Individuals in this family might be at some increased risk of developing cancer, over the general population risk, due to the family history of cancer.  Individuals in the family should notify their providers of the family history of cancer. We recommend women in this family have a yearly mammogram beginning at age 39, or 72 years younger than the earliest onset of cancer, an annual clinical breast exam, and perform monthly breast self-exams.   Family members should have colonoscopies by at age 36, or earlier, as recommended by their providers. Family members with a smoking history may speak with their providers about lung cancer screening.  Other members of the family may still carry a pathogenic variant in one of these genes that Jessica Wells did not inherit. Based on the family history, we recommend her niece, who was diagnosed with breast cancer at age 55, have genetic counseling and testing. Jessica Wells can let us know if we can be of any assistance in coordinating genetic counseling and/or testing for this family member.    FOLLOW-UP:  Given her strong family history, Jessica Wells stated that she was interested in learning about research opportunities related to genetics and lung cancer.  She was sent information for the Lung Cancer Study through [facility], which aims to understand genetic factors that contribute to lung cancer.   Lastly, we discussed with Jessica Wells that cancer genetics is a rapidly advancing field and it is possible that new genetic tests will be appropriate for her and/or her family members in the future. We encouraged her to remain in contact with cancer genetics on an annual basis so we can update her personal and family histories and let her know of advances in cancer genetics that may benefit this family.   Our contact number was provided. Jessica Wells questions were answered to her satisfaction, and she knows she is welcome to call us at anytime with additional questions or concerns.   Jeramy Dimmick M. Joette Catching, [place], [facility] Genetic Counselor [email or web address] (P) [phone]

## 2021-10-21 ENCOUNTER — Other Ambulatory Visit (HOSPITAL_BASED_OUTPATIENT_CLINIC_OR_DEPARTMENT_OTHER): Payer: Self-pay

## 2021-10-21 DIAGNOSIS — Z23 Encounter for immunization: Secondary | ICD-10-CM | POA: Diagnosis not present

## 2021-10-21 MED ORDER — PFIZER COVID-19 VAC BIVALENT 30 MCG/0.3ML IM SUSP
INTRAMUSCULAR | 0 refills | Status: DC
  Filled 2021-10-21: qty 0.3, 1d supply, fill #0

## 2021-10-23 DIAGNOSIS — Z23 Encounter for immunization: Secondary | ICD-10-CM | POA: Diagnosis not present

## 2021-10-28 NOTE — Progress Notes (Signed)
Patient Care Team: Rankins, Bill Salinas, MD as PCP - General (Family Medicine) Jerline Pain, MD as PCP - Cardiology (Cardiology)  DIAGNOSIS:    ICD-10-CM   1. Malignant neoplasm of central portion of right breast in female, estrogen receptor negative ([place])  C50.111    Z17.1     2. Adenocarcinoma of lung, stage 1, right ([place])  C34.91       SUMMARY OF ONCOLOGIC HISTORY: Oncology History  Cancer of central portion of right female breast ([place])  07/14/2012 Mammogram   And invasive mammary carcinoma with lymphovascular invasion grade 3 ER negative PR negative HER-2 positive inflammatory breast cancer   07/19/2012 Breast MRI   Right breast neoplasm level I and right axillary lymph node compatible with lymph node metastases biopsy proven to be invasive mammary cancer   07/23/2012 - 12/17/2012 Neo-Adjuvant Chemotherapy   Neoadjuvant FEC 100 followed by Taxol Herceptin due to neuropathy Taxol was replaced by gemcitabine x5 cycles   12/07/2012 -  Hospital Admission   Bilateral lower extremity DVT, workup showed prothrombin gene mutation heterozygous and IVC filter, bilateral pulmonary emboli and acute respiratory failure requiring vent support bronchoscopy and BAL concerning for adenocarcinoma, PET/CT negative   02/08/2013 Surgery   Right mastectomy showed complete response to chemotherapy in the breast and 11 lymph nodes negative   02/25/2013 - 10/14/2013 Chemotherapy   Herceptin maintenance for one year   03/31/2013 - 05/17/2013 Radiation Therapy   Adjuvant radiation therapy    05/10/2014 Surgery   Left mastectomy   09/29/2018 PET scan   No significant PET uptake in the subcentimeter solid right upper lobe lung nodule measuring 6 mm on the current study   10/07/2021 Genetic Testing   Negative hereditary cancer genetic testing: no pathogenic variants detected in [facility] Multi-Cancer +RNA Panel.  The report date is October 07, 2021.   The Multi-Cancer + RNA Panel offered by [facility]  includes sequencing and/or deletion/duplication analysis of the following 84 genes:  AIP*, ALK, APC*, ATM*, AXIN2*, BAP1*, BARD1*, BLM*, BMPR1A*, BRCA1*, BRCA2*, BRIP1*, CASR, CDC73*, CDH1*, CDK4, CDKN1B*, CDKN1C*, CDKN2A, CEBPA, CHEK2*, CTNNA1*, DICER1*, DIS3L2*, EGFR, EPCAM, FH*, FLCN*, GATA2*, GPC3, GREM1, HOXB13, HRAS, KIT, MAX*, MEN1*, MET, MITF, MLH1*, MSH2*, MSH3*, MSH6*, MUTYH*, NBN*, NF1*, NF2*, NTHL1*, PALB2*, PDGFRA, PHOX2B, PMS2*, POLD1*, POLE*, POT1*, PRKAR1A*, PTCH1*, PTEN*, RAD50*, RAD51C*, RAD51D*, RB1*, RECQL4, RET, RUNX1*, SDHA*, SDHAF2*, SDHB*, SDHC*, SDHD*, SMAD4*, SMARCA4*, SMARCB1*, SMARCE1*, STK11*, SUFU*, TERC, TERT, TMEM127*, Tp53*, TSC1*, TSC2*, VHL*, WRN*, and WT1.  RNA analysis is performed for * genes.   Adenocarcinoma of lung, stage 1, right ([place])  02/02/2021 Initial Diagnosis   Adenocarcinoma of lung, stage 1, right ([place])   09/26/2021 Cancer Staging   Staging form: Lung, AJCC 8th Edition - Clinical: Stage IA1 (cT1a, cN0, cM0) - Signed by Curt Bears, MD on 09/26/2021      CHIEF COMPLIANT: Surveillance of right breast cancer  INTERVAL HISTORY: Jessica Wells is a 75 y.o. with above-mentioned history of right breast cancer treated with neoadjuvant chemotherapy, right mastectomy, and radiation who is currently on surveillance. She also has a history of non-small cell lung cancer for which she recently underwent a right upper lobe segmentectomy and is followed by Dr. Lamonte Sakai. She presents to the clinic today for follow-up.  She has done amazingly well from the lung resection standpoint.  She denies any lumps or nodules in the chest or axilla.   ALLERGIES:  is allergic to alpha-gal; anesthetics, amide; bactrim [sulfamethoxazole-trimethoprim]; sulfa antibiotics; anesthesia [id number]-i-[id number] [propofol]; codeine; codeine phosphate;  eggs or egg-derived products; fish allergy; milk-related compounds; and other.  MEDICATIONS:  Current Outpatient Medications  Medication Sig Dispense  Refill   SYMBICORT 160-4.5 MCG/ACT inhaler INHALE 2 PUFFS BY MOUTH TWICE DAILY USE WITH SPACER 10 g 2   albuterol (VENTOLIN HFA) 108 (90 Base) MCG/ACT inhaler INHALE 4 PUFFS BY MOUTH EVERY 4 TO 6 HOURS AS NEEDED FOR COUGH AND FOR WHEEZING .USE  WITH  SPACER. 18 g 0   atorvastatin (LIPITOR) 10 MG tablet Take 10 mg by mouth daily.     B Complex-C (SUPER B COMPLEX PO) Take 1 tablet by mouth at bedtime.      calcium-vitamin D (OSCAL WITH D) 500-200 MG-UNIT per tablet Take 1 tablet by mouth every other day.      COVID-19 mRNA bivalent vaccine, Pfizer, (PFIZER COVID-19 VAC BIVALENT) injection Inject into the muscle. 0.3 mL 0   COVID-19 mRNA Vac-TriS, Pfizer, (PFIZER-BIONT COVID-19 VAC-TRIS) SUSP injection Inject into the muscle. 0.3 mL 0   EPINEPHrine 0.3 mg/0.3 mL IJ SOAJ injection Use as directed for severe allergic reaction 2 each 1   fluticasone (FLONASE) 50 MCG/ACT nasal spray Place 1 spray into both nostrils as needed for allergies or rhinitis. 16 g 5   glucosamine-chondroitin 500-400 MG tablet Take 1 tablet by mouth at bedtime.      lisinopril (PRINIVIL,ZESTRIL) 2.5 MG tablet Take 2.5 mg by mouth every evening.      Magnesium 250 MG TABS Take 250 mg by mouth at bedtime as needed (leg cramps).     metoprolol succinate (TOPROL-XL) 50 MG 24 hr tablet Take 1 tablet by mouth once daily 90 tablet 3   Multiple Vitamin (MULTIVITAMIN WITH MINERALS) TABS Take 1 tablet by mouth every other day.      omeprazole (PRILOSEC) 20 MG capsule Take 1 capsule (20 mg total) by mouth daily. For heartburn 30 capsule 5   XARELTO 20 MG TABS tablet TAKE 1 TABLET BY MOUTH ONCE DAILY WITH SUPPER 90 tablet 3   No current facility-administered medications for this visit.    PHYSICAL EXAMINATION: ECOG PERFORMANCE STATUS: 1 - Symptomatic but completely ambulatory  Vitals:   10/29/21 0935  BP: (!) 149/70  Pulse: 65  Resp: 18  Temp: (!) 97.5 F (36.4 C)  SpO2: 99%   Filed Weights   10/29/21 0935  Weight: 137 lb 4.8  oz (62.3 kg)    BREAST: No palpable masses or nodules (exam performed in the presence of a chaperone)  LABORATORY DATA:  I have reviewed the data as listed CMP Latest Ref Rng & Units 09/20/2021 03/27/2021 02/01/2021  Glucose 70 - 99 mg/dL 129(H) 130(H) 127(H)  BUN 8 - 23 mg/dL 25(H) 19 16  Creatinine 0.44 - 1.00 mg/dL 0.93 0.87 0.87  Sodium 135 - 145 mmol/L 139 141 135  Potassium 3.5 - 5.1 mmol/L 4.6 3.7 3.7  Chloride 98 - 111 mmol/L 104 104 104  CO2 22 - 32 mmol/L _0 Calcium 8.9 - 10.3 mg/dL 9.7 9.3 8.6(L)  Total Protein 6.5 - 8.1 g/dL 7.1 - -  Total Bilirubin 0.3 - 1.2 mg/dL 0.8 - -  Alkaline Phos 38 - 126 U/L 50 - -  AST 15 - 41 U/L 18 - -  ALT 0 - 44 U/L 18 - -    Lab Results  Component Value Date   WBC 4.9 09/20/2021   HGB 12.9 09/20/2021   HCT 37.7 09/20/2021   MCV 90.6 09/20/2021   PLT 152 09/20/2021  NEUTROABS 2.4 09/20/2021    ASSESSMENT & PLAN:  Cancer of central portion of right female breast Right breast inflammation breast cancer diagnosed August 2013 treated with neoadjuvant chemotherapy completed 12/17/2012 hospitalized for bilateral DVT and respiratory failure, bronchoscopy showed adenocarcinoma, right mastectomy 02/08/2013 followed by Herceptin maintenance completed 10/14/2013, adjuvant radiation completed 05/17/2013.  Breast Cancer Surveillance: 1. Breast Exam 10/29/21: Benign 2. Mammograms: Rt Mastectomy  History of DVTs: Lifelong Xarelto.  Adenocarcinoma of lung, stage 1, right ([facility]) 1) Stage Ia (T1 a, N0, M0) non-small cell lung cancer, adenocarcinoma presented with right upper lobe nodule  2) A history of inflammatory breast cancer involving the right breast, HR negative and HER2 positive status post neoadjuvant chemotherapy followed by right mastectomy followed by adjuvant therapy and she was followed by Dr. Laurelyn Sickle and Dr. Lindi Adie.Marland Kitchen   PRIOR THERAPY: status post right upper lobe superior segmentectomy with lymph node dissection under the care  of Dr. Roxan Hockey on January 30, 2021.   CURRENT THERAPY: Observation. (follows with Dr.Mohamed)  Patient will be seen on an as-needed basis.  If she needs about a prescription we will continue to prescribe it for her.  However she will check with her primary care physician if they would be willing to take over the prescription for Xarelto.  No orders of the defined types were placed in this encounter.  The patient has a good understanding of the overall plan. she agrees with it. she will call with any problems that may develop before the next visit here.  Total time spent: 20 mins including face to face time and time spent for planning, charting and coordination of care  Rulon Eisenmenger, MD, MPH 10/29/2021  I, Thana Ates, am acting as scribe for Dr. Nicholas Lose.  I have reviewed the above documentation for accuracy and completeness, and I agree with the above.

## 2021-10-28 NOTE — Assessment & Plan Note (Signed)
Right breast inflammation breast cancer diagnosed August 2013 treated with neoadjuvant chemotherapy completed 12/17/2012 hospitalized for bilateral DVT and respiratory failure, bronchoscopy showed adenocarcinoma, right mastectomy 02/08/2013 followed by Herceptin maintenance completed 10/14/2013, adjuvant radiation completed 05/17/2013.  Breast Cancer Surveillance: 1. Breast Exam 10/29/21: Benign 2. Mammograms: Rt Mastectomy

## 2021-10-28 NOTE — Assessment & Plan Note (Signed)
1) Stage Ia (T1 a, N0, M0) non-small cell lung cancer, adenocarcinoma presented with right upper lobe nodule  2) A history of inflammatory breast cancer involving the right breast, HR negative and HER2 positive status post neoadjuvant chemotherapy followed by right mastectomy followed by adjuvant therapy and she was followed by Dr. Laurelyn Sickle and Dr. Lindi Adie.Jessica Wells  PRIOR THERAPY: status post right upper lobe superior segmentectomy with lymph node dissection under the care of Dr. Roxan Hockey on January 30, 2021.  CURRENT THERAPY: Observation. (follows with Dr.Mohamed)

## 2021-10-29 ENCOUNTER — Other Ambulatory Visit: Payer: Self-pay

## 2021-10-29 ENCOUNTER — Inpatient Hospital Stay: Payer: Medicare Other | Attending: Internal Medicine | Admitting: Hematology and Oncology

## 2021-10-29 DIAGNOSIS — C50111 Malignant neoplasm of central portion of right female breast: Secondary | ICD-10-CM | POA: Diagnosis not present

## 2021-10-29 DIAGNOSIS — Z9221 Personal history of antineoplastic chemotherapy: Secondary | ICD-10-CM | POA: Insufficient documentation

## 2021-10-29 DIAGNOSIS — C3411 Malignant neoplasm of upper lobe, right bronchus or lung: Secondary | ICD-10-CM | POA: Diagnosis not present

## 2021-10-29 DIAGNOSIS — Z79899 Other long term (current) drug therapy: Secondary | ICD-10-CM | POA: Diagnosis not present

## 2021-10-29 DIAGNOSIS — Z923 Personal history of irradiation: Secondary | ICD-10-CM | POA: Insufficient documentation

## 2021-10-29 DIAGNOSIS — Z9012 Acquired absence of left breast and nipple: Secondary | ICD-10-CM | POA: Insufficient documentation

## 2021-10-29 DIAGNOSIS — C3491 Malignant neoplasm of unspecified part of right bronchus or lung: Secondary | ICD-10-CM | POA: Diagnosis not present

## 2021-10-29 DIAGNOSIS — Z171 Estrogen receptor negative status [ER-]: Secondary | ICD-10-CM | POA: Diagnosis not present

## 2021-12-27 DIAGNOSIS — Z23 Encounter for immunization: Secondary | ICD-10-CM | POA: Diagnosis not present

## 2022-03-02 IMAGING — CT CT CHEST W/ CM
2 of 4 series · 14 of 36 positions shown, 17 images · IV contrast (OMNIPAQUE)
Comparison: Chest CT 01/28/2021.

CLINICAL DATA: 75-year-old female with history of non-small cell
lung cancer (adenocarcinoma) status post surgical resection,
radiation therapy and chemotherapy, now complete. Follow-up study.

EXAM:
CT CHEST WITH CONTRAST
TECHNIQUE: Multidetector CT imaging of the chest was performed during
intravenous contrast administration.
CONTRAST:  60mL OMNIPAQUE IOHEXOL 350 MG/ML SOLN

[Series 2: axial st · axial · 0.64mm/px · z∈[-275,-19]mm · 11 of 152 slices shown, 14 images]
[im 12/152  mediastinal]
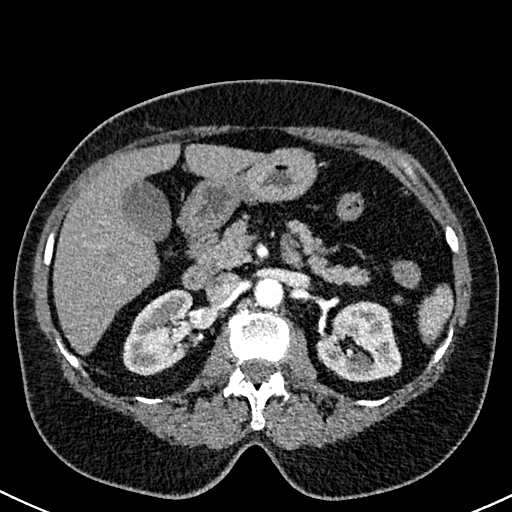
[im 12/152  lung]
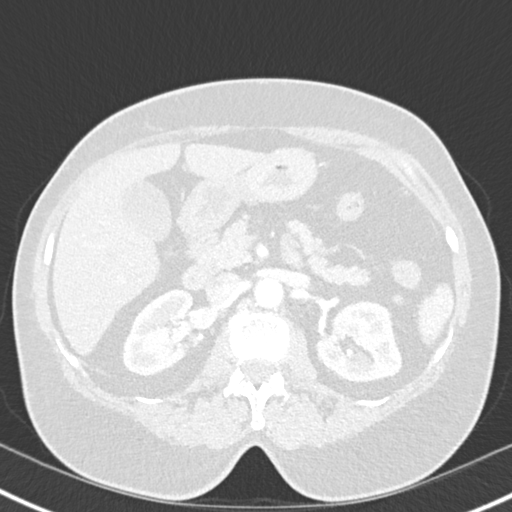
[im 24/152  lung]
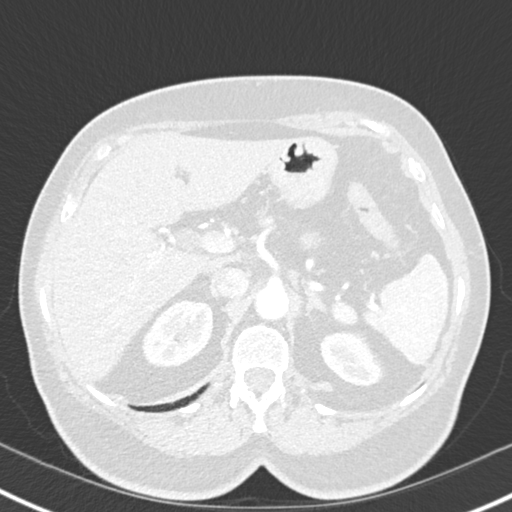
[im 35/152  lung]
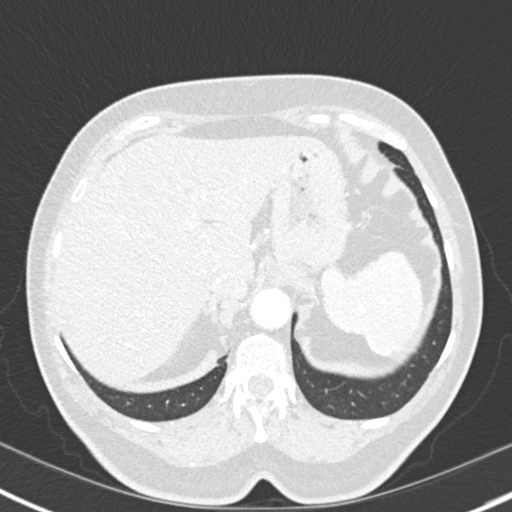
[im 47/152  lung]
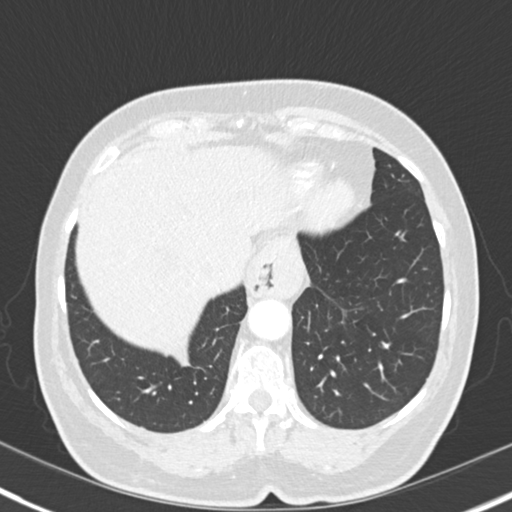
[im 59/152  mediastinal]
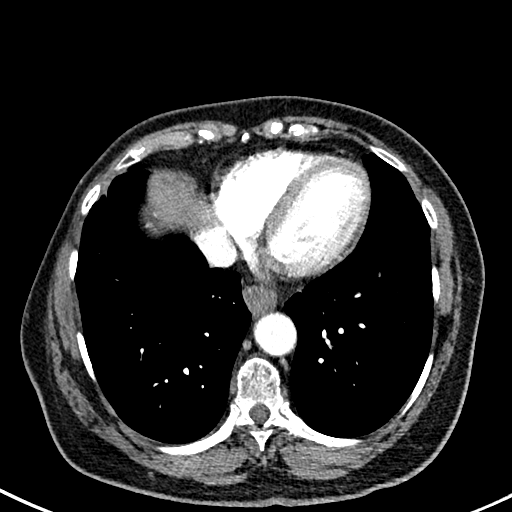
[im 59/152  lung]
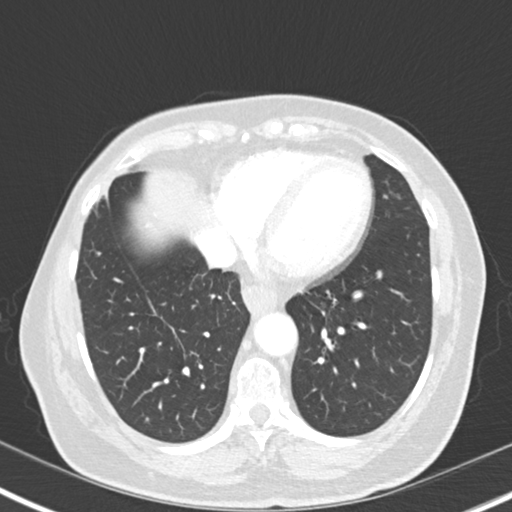
[im 82/152  lung]
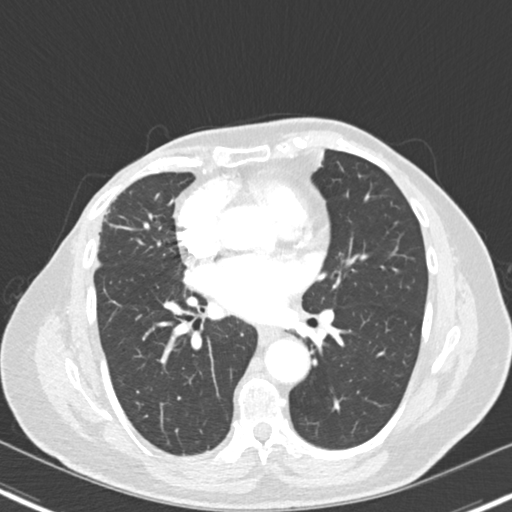
[im 93/152  lung]
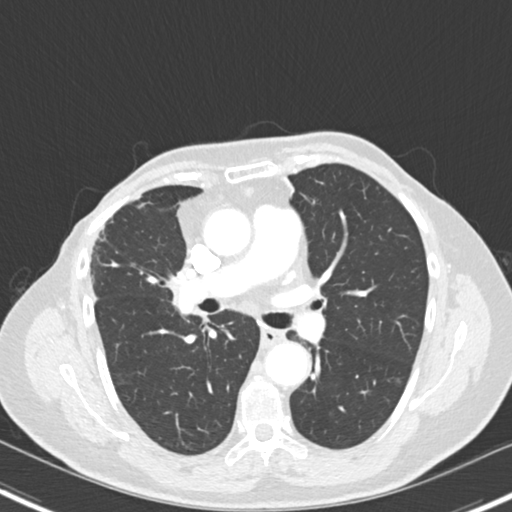
[im 105/152  lung]
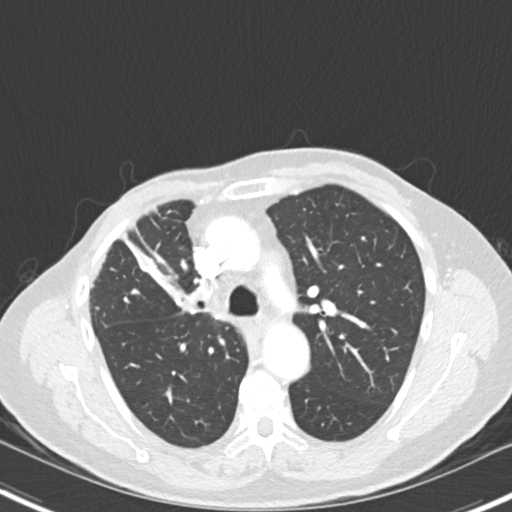
[im 117/152  mediastinal]
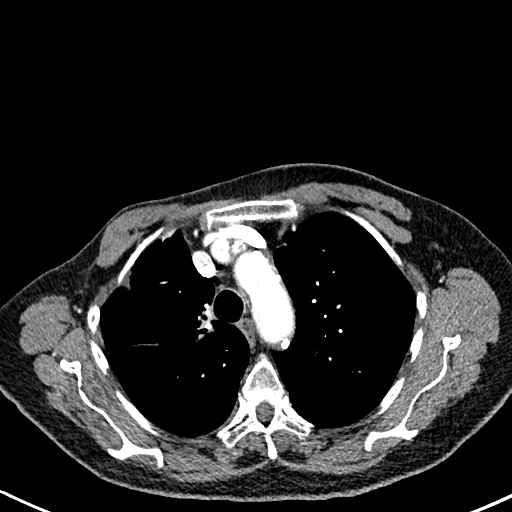
[im 117/152  lung]
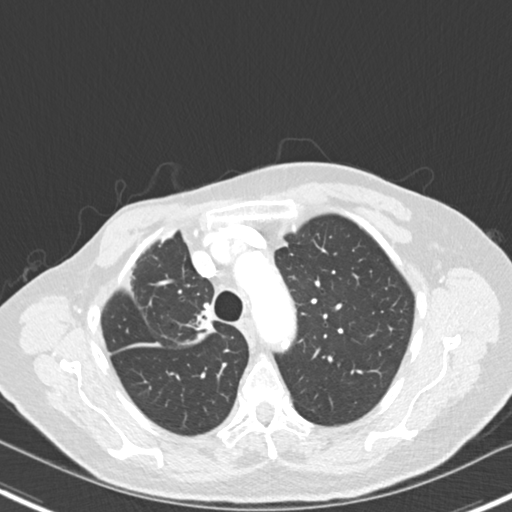
[im 128/152  lung]
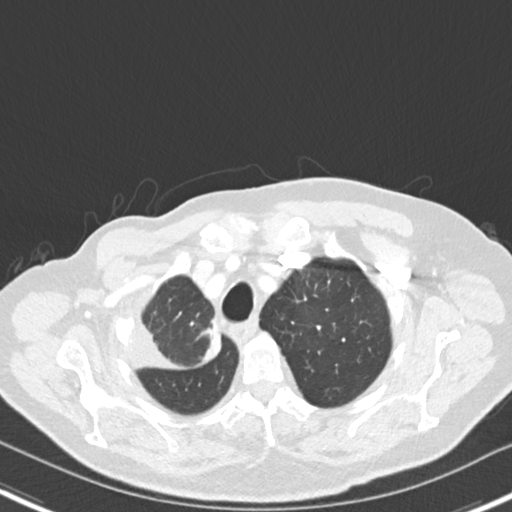
[im 140/152  lung]
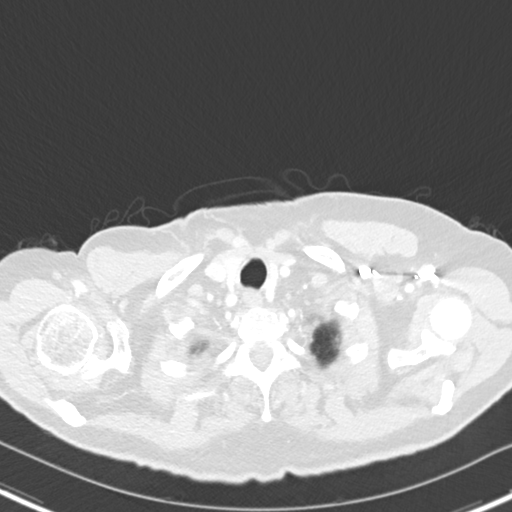

[Series 5: coronal · coronal · 0.73mm/px · 3 of 127 slices shown]
[im 26/127  lung]
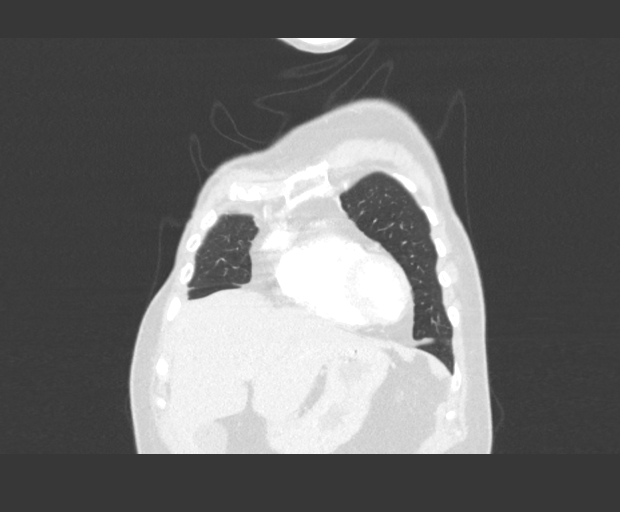
[im 51/127  lung]
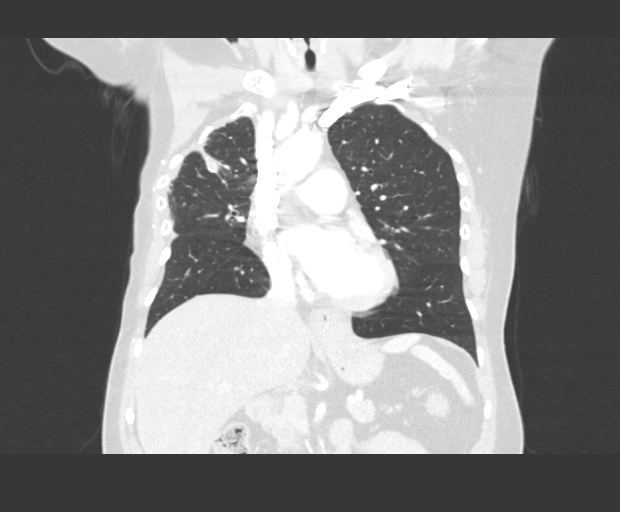
[im 76/127  lung]
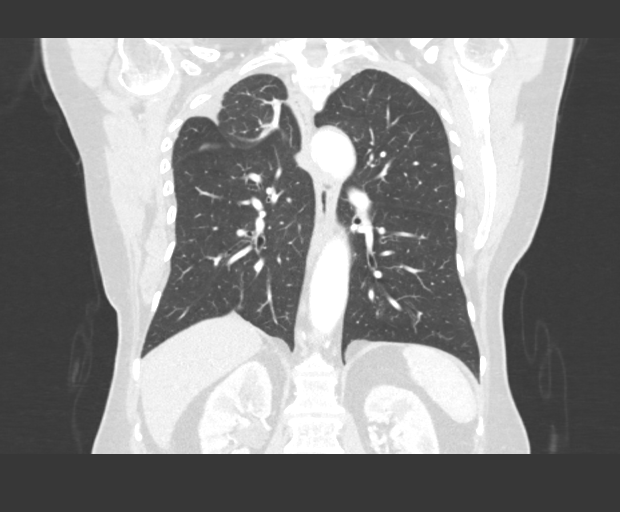

[14 of 36 positions shown; findings below may reference images not displayed]

FINDINGS: Cardiovascular: Heart size is normal. There is no significant
pericardial fluid, thickening or pericardial calcification. There is
aortic atherosclerosis, as well as atherosclerosis of the great
vessels of the mediastinum and the coronary arteries, including
calcified atherosclerotic plaque in the left anterior descending
coronary artery.

Mediastinum/Nodes: No pathologically enlarged mediastinal, internal
mammary or hilar lymph nodes. Small to moderate hiatal hernia. No
axillary lymphadenopathy. Surgical clips in the right axillary
region from prior lymph node dissection.

Lungs/Pleura: Compared to the prior examination there has been right
upper lobe wedge resection. Compensatory hyperexpansion of the
remaining portions of the right upper lobe, right middle and right
lower lobes. Trace amount of pleural fluid loculated in the
periphery of the right upper hemithorax. No acute consolidative
airspace disease. No left-sided pleural effusion. Small pulmonary
nodules in the left lower lobe measuring 3 mm on axial image 99 of
series 7 and 3 mm ground-glass attenuation nodule in the left lower
lobe on axial image 82 of series 7, stable compared to the prior
study. No larger more suspicious appearing pulmonary nodules or
masses are noted.

Upper Abdomen: Unremarkable.

Musculoskeletal: Status post right modified radical mastectomy.
There are no aggressive appearing lytic or blastic lesions noted in
the visualized portions of the skeleton.
IMPRESSION: 1. Postoperative changes related to prior right upper lobe superior
segmentectomy, with no definitive findings to suggest residual or
metastatic disease in the thorax.
2. Small left lower lobe pulmonary nodules, stable compared to the
prior study, likely benign. Attention on follow-up imaging is
recommended to ensure stability.
3. Aortic atherosclerosis, in addition to left anterior descending
coronary artery disease. Please note that although the presence of
coronary artery calcium documents the presence of coronary artery
disease, the severity of this disease and any potential stenosis
cannot be assessed on this non-gated CT examination. Assessment for
potential risk factor modification, dietary therapy or pharmacologic
therapy may be warranted, if clinically indicated.
4. Additional incidental findings, as above.

Aortic Atherosclerosis ([id number].P).

## 2022-03-24 ENCOUNTER — Ambulatory Visit (HOSPITAL_COMMUNITY)
Admission: RE | Admit: 2022-03-24 | Discharge: 2022-03-24 | Disposition: A | Discharge status: Home / Self Care | Payer: Medicare Other | Source: Ambulatory Visit | Attending: Internal Medicine | Admitting: Internal Medicine

## 2022-03-24 ENCOUNTER — Other Ambulatory Visit: Payer: Self-pay

## 2022-03-24 ENCOUNTER — Inpatient Hospital Stay: Payer: Medicare Other | Attending: Internal Medicine

## 2022-03-24 DIAGNOSIS — Z801 Family history of malignant neoplasm of trachea, bronchus and lung: Secondary | ICD-10-CM | POA: Insufficient documentation

## 2022-03-24 DIAGNOSIS — Z7901 Long term (current) use of anticoagulants: Secondary | ICD-10-CM | POA: Diagnosis not present

## 2022-03-24 DIAGNOSIS — Z86718 Personal history of other venous thrombosis and embolism: Secondary | ICD-10-CM | POA: Insufficient documentation

## 2022-03-24 DIAGNOSIS — Z9221 Personal history of antineoplastic chemotherapy: Secondary | ICD-10-CM | POA: Diagnosis not present

## 2022-03-24 DIAGNOSIS — C3490 Malignant neoplasm of unspecified part of unspecified bronchus or lung: Secondary | ICD-10-CM

## 2022-03-24 DIAGNOSIS — I700 Atherosclerosis of aorta: Secondary | ICD-10-CM | POA: Diagnosis not present

## 2022-03-24 DIAGNOSIS — R918 Other nonspecific abnormal finding of lung field: Secondary | ICD-10-CM | POA: Diagnosis not present

## 2022-03-24 DIAGNOSIS — Z803 Family history of malignant neoplasm of breast: Secondary | ICD-10-CM | POA: Diagnosis not present

## 2022-03-24 DIAGNOSIS — K219 Gastro-esophageal reflux disease without esophagitis: Secondary | ICD-10-CM | POA: Diagnosis not present

## 2022-03-24 DIAGNOSIS — K760 Fatty (change of) liver, not elsewhere classified: Secondary | ICD-10-CM | POA: Diagnosis not present

## 2022-03-24 DIAGNOSIS — C3411 Malignant neoplasm of upper lobe, right bronchus or lung: Secondary | ICD-10-CM | POA: Diagnosis not present

## 2022-03-24 DIAGNOSIS — Z79899 Other long term (current) drug therapy: Secondary | ICD-10-CM | POA: Diagnosis not present

## 2022-03-24 DIAGNOSIS — I2510 Atherosclerotic heart disease of native coronary artery without angina pectoris: Secondary | ICD-10-CM | POA: Insufficient documentation

## 2022-03-24 DIAGNOSIS — Z853 Personal history of malignant neoplasm of breast: Secondary | ICD-10-CM | POA: Insufficient documentation

## 2022-03-24 LAB — CMP (CANCER CENTER ONLY)
ALT: 17 U/L (ref 0–44)
AST: 16 U/L (ref 15–41)
Albumin: 4.3 g/dL (ref 3.5–5.0)
Alkaline Phosphatase: 56 U/L (ref 38–126)
Anion gap: 6 (ref 5–15)
BUN: 20 mg/dL (ref 8–23)
CO2: 29 mmol/L (ref 22–32)
Calcium: 9.3 mg/dL (ref 8.9–10.3)
Chloride: 103 mmol/L (ref 98–111)
Creatinine: 0.91 mg/dL (ref 0.44–1.00)
GFR, Estimated: >60 mL/min (ref >60)
Glucose, Bld: 132 mg/dL — ABNORMAL HIGH (ref 70–99)
Potassium: 4.2 mmol/L (ref 3.5–5.1)
Sodium: 138 mmol/L (ref 135–145)
Total Bilirubin: 0.5 mg/dL (ref 0.3–1.2)
Total Protein: 6.9 g/dL (ref 6.5–8.1)

## 2022-03-24 LAB — CBC WITH DIFFERENTIAL (CANCER CENTER ONLY)
Abs Immature Granulocytes: 0.01 10*3/uL (ref 0.00–0.07)
Basophils Absolute: 0 10*3/uL (ref 0.0–0.1)
Basophils Relative: 1 %
Eosinophils Absolute: 0.2 10*3/uL (ref 0.0–0.5)
Eosinophils Relative: 3 %
HCT: 38.1 % (ref 36.0–46.0)
Hemoglobin: 12.9 g/dL (ref 12.0–15.0)
Immature Granulocytes: 0 %
Lymphocytes Relative: 34 %
Lymphs Abs: 1.7 10*3/uL (ref 0.7–4.0)
MCH: 30.9 pg (ref 26.0–34.0)
MCHC: 33.9 g/dL (ref 30.0–36.0)
MCV: 91.1 fL (ref 80.0–100.0)
Monocytes Absolute: 0.5 10*3/uL (ref 0.1–1.0)
Monocytes Relative: 10 %
Neutro Abs: 2.6 10*3/uL (ref 1.7–7.7)
Neutrophils Relative %: 52 %
Platelet Count: 162 10*3/uL (ref 150–400)
RBC: 4.18 MIL/uL (ref 3.87–5.11)
RDW: 12.4 % (ref 11.5–15.5)
WBC Count: 5.1 10*3/uL (ref 4.0–10.5)
nRBC: 0 % (ref 0.0–0.2)

## 2022-03-24 MED ORDER — SODIUM CHLORIDE (PF) 0.9 % IJ SOLN
INTRAMUSCULAR | Status: AC
Start: 2022-03-24 — End: 2022-03-24
  Filled 2022-03-24: qty 50

## 2022-03-24 MED ORDER — IOHEXOL 300 MG/ML  SOLN
75.0000 mL | Freq: Once | INTRAMUSCULAR | Status: AC | PRN
  Administered 2022-03-24: 75 mL via INTRAVENOUS

## 2022-03-26 ENCOUNTER — Other Ambulatory Visit: Payer: Self-pay

## 2022-03-26 ENCOUNTER — Inpatient Hospital Stay (HOSPITAL_BASED_OUTPATIENT_CLINIC_OR_DEPARTMENT_OTHER): Payer: Medicare Other | Admitting: Internal Medicine

## 2022-03-26 VITALS — BP 167/75 | HR 60 | Temp 97.8°F | Resp 15 | Wt 138.6 lb

## 2022-03-26 DIAGNOSIS — C3411 Malignant neoplasm of upper lobe, right bronchus or lung: Secondary | ICD-10-CM | POA: Diagnosis not present

## 2022-03-26 DIAGNOSIS — C3490 Malignant neoplasm of unspecified part of unspecified bronchus or lung: Secondary | ICD-10-CM

## 2022-03-26 DIAGNOSIS — I700 Atherosclerosis of aorta: Secondary | ICD-10-CM | POA: Diagnosis not present

## 2022-03-26 DIAGNOSIS — K760 Fatty (change of) liver, not elsewhere classified: Secondary | ICD-10-CM | POA: Diagnosis not present

## 2022-03-26 DIAGNOSIS — I2510 Atherosclerotic heart disease of native coronary artery without angina pectoris: Secondary | ICD-10-CM | POA: Diagnosis not present

## 2022-03-26 DIAGNOSIS — K219 Gastro-esophageal reflux disease without esophagitis: Secondary | ICD-10-CM | POA: Diagnosis not present

## 2022-03-26 DIAGNOSIS — Z9221 Personal history of antineoplastic chemotherapy: Secondary | ICD-10-CM | POA: Diagnosis not present

## 2022-03-26 NOTE — Progress Notes (Signed)
?    [facility] ?[phone]   [phone] ? ?OFFICE PROGRESS NOTE ? ?Rankins, Bill Salinas, MD ?[place] ?[place] [address] ? ?DIAGNOSIS:  ?1) Stage IA (T1 a, N0, M0) non-small cell lung cancer, adenocarcinoma presented with right upper lobe nodule  ?2) A history of inflammatory breast cancer involving the right breast, HR negative and HER2 positive status post neoadjuvant chemotherapy followed by right mastectomy followed by adjuvant therapy and she was followed by Dr. Humphrey Rolls and Dr. Lindi Adie.. ? ?PRIOR THERAPY: status post right upper lobe superior segmentectomy with lymph node dissection under the care of Dr. Roxan Hockey on January 30, 2021. ? ?CURRENT THERAPY: Observation.  ? ?INTERVAL HISTORY: ?Jessica Wells 76 y.o. female returns to the clinic today for 23-monthfollow-up visit.  The patient is feeling fine today with no concerning complaints.  She denied having any chest pain, shortness of breath, cough or hemoptysis.  She has no nausea, vomiting, diarrhea or constipation.  She has no headache or visual changes.  She has no fatigue or weakness.  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  She is here today for evaluation and repeat blood work as well as CT scan of the chest for restaging of her disease. ? ?MEDICAL HISTORY: ?Past Medical History:  ?Diagnosis Date  ? Allergic reaction to alpha-gal   ? Allergy   ? Anemia   ? "after chemo"  ? Arthritis   ? "knees; fingers; occasionally" (05/10/2014)  ? Asthma   ? Uses Inhalers Proventil as needed  ? Breast [place] 07/14[place]12  ? inflammatory right breast ca, ER/PR -  ? Bursitis   ? "both shoulders"  ? [place]otting disorder ([facility]   ? prothrombin gene mutation-heterozygous   ? DVT (deep venous thrombosis) ([facility] 12/22/2012  ? BLE  ? Dysrhythmia   ? PAT-sees dr sLina Sayremeds  ? Family history of breast cancer 04/04/2021  ? Family history of lung cancer 04/04/2021  ? GERD (gastroesophageal reflux disease)   ?  H/O hiatal hernia   ? History of blood transfusion 01-25-13  ? 2 units 01-11-13 ("after chem")  ? History of chemotherapy   ? Last dose to be 02-04-13.  Was rx'd with Herceptin & Gemzar  ? Lymphedema   ? RUE  ? Osteopenia   ? "lower spine only" (05/10/2014)  ? PAT (paroxysmal atrial tachycardia) (HCC)   ? hx  ? PE (pulmonary thromboembolism) ([facility] 12/27/2012  ? Pneumonia 01-25-13  ? hx. 12-27-12-hospital stay x 9 days, now resolved.  ? Pneumonia   ? "episodic; all my life" (05/10/2014)  ? PONV (postoperative nausea and vomiting) 09-13-12  ? severe, with Port-a-cath, was managed without PONV  ? S/P radiation therapy 4-12 wks ago 03/31/13-05/17/13  ? right breast/supraclavicular fossa/posterior axillary boost  ? ? ?ALLERGIES:  is allergic to alpha-gal; anesthetics, amide; bactrim [sulfamethoxazole-trimethoprim]; sulfa antibiotics; anesthesia s-i-40 [propofol]; codeine; codeine phosphate; eggs or egg-derived products; fish allergy; milk-related compounds; and other. ? ?MEDICATIONS:  ?Current Outpatient Medications  ?Medication Sig Dispense Refill  ? SYMBICORT 160-4.5 MCG/ACT inhaler INHALE 2 PUFFS BY MOUTH TWICE DAILY USE WITH SPACER 10 g 2  ? albuterol (VENTOLIN HFA) 108 (90 Base) MCG/ACT inhaler INHALE 4 PUFFS BY MOUTH EVERY 4 TO 6 HOURS AS NEEDED FOR COUGH AND FOR WHEEZING .USE  WITH  SPACER. 18 g 0  ? atorvastatin (LIPITOR) 10 MG tablet Take 10 mg by mouth daily.    ? B Complex-C (SUPER B  COMPLEX PO) Take 1 tablet by mouth at bedtime.     ? calcium-vitamin D (OSCAL WITH D) 500-200 MG-UNIT per tablet Take 1 tablet by mouth every other day.     ? COVID-19 mRNA bivalent vaccine, Pfizer, (PFIZER COVID-19 VAC BIVALENT) injection Inject into the muscle. 0.3 mL 0  ? COVID-19 mRNA Vac-TriS, Pfizer, (PFIZER-BIONT COVID-19 VAC-TRIS) SUSP injection Inject into the muscle. 0.3 mL 0  ? EPINEPHrine 0.3 mg/0.3 mL IJ SOAJ injection Use as directed for severe allergic reaction 2 each 1  ? fluticasone (FLONASE) 50 MCG/ACT nasal spray Place 1  spray into both nostrils as needed for allergies or rhinitis. 16 g 5  ? glucosamine-chondroitin 500-400 MG tablet Take 1 tablet by mouth at bedtime.     ? lisinopril (PRINIVIL,ZESTRIL) 2.5 MG tablet Take 2.5 mg by mouth every evening.     ? Magnesium 250 MG TABS Take 250 mg by mouth at bedtime as needed (leg cramps).    ? metoprolol succinate (TOPROL-XL) 50 MG 24 hr tablet Take 1 tablet by mouth once daily 90 tablet 3  ? Multiple Vitamin (MULTIVITAMIN WITH MINERALS) TABS Take 1 tablet by mouth every other day.     ? omeprazole (PRILOSEC) 20 MG capsule Take 1 capsule (20 mg total) by mouth daily. For heartburn 30 capsule 5  ? XARELTO 20 MG TABS tablet TAKE 1 TABLET BY MOUTH ONCE DAILY WITH SUPPER 90 tablet 3  ? ?No current facility-administered medications for this visit.  ? ? ?SURGICAL HISTORY:  ?Past Surgical History:  ?Procedure Laterality Date  ? BREAST BIOPSY Right 2013 X 3  ? CHALAZION EXCISION Right 1980's  ? right( MD office)  ? COLONOSCOPY    ? [place] OF UTERUS  1998 X 3  ? INSERTION OF VENA CAVA FILTER  12/2012  ? INTERCOSTAL NERVE BLOCK Right 01/30/2021  ? Procedure: INTERCOSTAL NERVE BLOCK;  Surgeon: [name], MD;  Location: [facility];  Service: Thoracic;  Laterality: Right;  ? MASTECTOMY Left 05/10/2014  ? PROPHYLACTIC   ? MASTECTOMY MODIFIED RADICAL Right 02/08/2013  ? Procedure: MASTECTOMY MODIFIED RADICAL;  Surgeon: Rolm Bookbinder, MD;  [facility];  Service: General;  Laterality: Right;  ? MASTECTOMY MODIFIED RADICAL Right   ? NODE DISSECTION Right 01/30/2021  ? Procedure: NODE DISSECTION;  Surgeon: [name], MD;  Location: [facility];  Service: Thoracic;  Laterality: Right;  ? NODE DISSECTION (Right)  01/30/2021  ? PORT-A-CATH REMOVAL  05/10/2014  ? PORT-A-CATH REMOVAL N/A 05/10/2014  ? Procedure: REMOVAL PORT-A-CATH;  Surgeon: Rolm Bookbinder, MD;  Location: [facility];  Service: General;  Laterality: N/A;  ? PORTACATH PLACEMENT  07/21/2012  ? Procedure: INSERTION  PORT-A-CATH;  Surgeon: Rolm Bookbinder, MD;  Location: [facility];  Service: General;  Laterality: Left;/ Replacement done 10'13  ? SIMPLE MASTECTOMY WITH AXILLARY SENTINEL NODE BIOPSY Left 05/10/2014  ? Procedure: PROPHYLACTIC LEFT MASTECTOMY;  Surgeon: Rolm Bookbinder, MD;  Location: [facility];  Service: General;  Laterality: Left;  ? TONSILLECTOMY    ? TUBAL LIGATION  ~ 1974  ? TUMOR EXCISION Right 1970  ? giant cell, off my forearm"  ? VIDEO BRONCHOSCOPY WITH ENDOBRONCHIAL NAVIGATION N/A 01/30/2021  ? Procedure: VIDEO BRONCHOSCOPY WITH ENDOBRONCHIAL NAVIGATION FOR TUMOR MARKING;  Surgeon: [name], MD;  [facility];  Service: Thoracic;  Laterality: N/A;  ? VIDEO BRONCHOSCOPY WITH ENDOBRONCHIAL NAVIGATION FOR TUMOR MARKING (N/A)  01/30/2021  ? XI ROBOTIC ASSISTED THORACOSCOPY- SEGMENTECTOMY Right 01/30/2021  ? Procedure:  XI ROBOTIC ASSISTED THORACOSCOPY-POSTERIOR SEGMENTECTOMY RIGHT UPPER LOBE;  Surgeon: [name], MD;  Location: [facility] OR;  Service: Thoracic;  Laterality: Right;  ? XI ROBOTIC ASSISTED THORACOSCOPY-POSTERIOR SEGMENTECTOMY RIGHT UPPER LOBE (Right)  01/30/2021  ? ? ?REVIEW OF SYSTEMS:  A comprehensive review of systems was negative.  ? ?PHYSICAL EXAMINATION: General appearance: alert, cooperative, and no distress ?Head: Normocephalic, without obvious abnormality, atraumatic ?Neck: no adenopathy, no JVD, supple, symmetrical, trachea midline, and thyroid not enlarged, symmetric, no tenderness/mass/nodules ?Lymph nodes: Cervical, supraclavicular, and axillary nodes normal. ?Resp: clear to auscultation bilaterally ?Back: symmetric, no curvature. ROM normal. No CVA tenderness. ?Cardio: regular rate and rhythm, S1, S2 normal, no murmur, click, rub or gallop ?GI: soft, non-tender; bowel sounds normal; no masses,  no organomegaly ?Extremities: extremities normal, atraumatic, no cyanosis or edema ? ?ECOG PERFORMANCE STATUS: 0 - Asymptomatic ? ?Blood pressure (!) 167/75,  pulse 60, temperature 97.8 ?F (36.6 ?C), temperature source Tympanic, resp. rate 15, weight 138 lb 9.6 oz (62.9 kg), SpO2 100 %. ? ?LABORATORY DATA: ?Lab Results  ?Component Value Date  ? WBC 5.1 03/24/2022  ? H

## 2022-04-01 DIAGNOSIS — J45909 Unspecified asthma, uncomplicated: Secondary | ICD-10-CM | POA: Diagnosis not present

## 2022-04-01 DIAGNOSIS — C3491 Malignant neoplasm of unspecified part of right bronchus or lung: Secondary | ICD-10-CM | POA: Diagnosis not present

## 2022-04-01 DIAGNOSIS — E7800 Pure hypercholesterolemia, unspecified: Secondary | ICD-10-CM | POA: Diagnosis not present

## 2022-04-01 DIAGNOSIS — J302 Other seasonal allergic rhinitis: Secondary | ICD-10-CM | POA: Diagnosis not present

## 2022-04-01 DIAGNOSIS — M8580 Other specified disorders of bone density and structure, unspecified site: Secondary | ICD-10-CM | POA: Diagnosis not present

## 2022-04-01 DIAGNOSIS — E1169 Type 2 diabetes mellitus with other specified complication: Secondary | ICD-10-CM | POA: Diagnosis not present

## 2022-04-01 DIAGNOSIS — Z853 Personal history of malignant neoplasm of breast: Secondary | ICD-10-CM | POA: Diagnosis not present

## 2022-04-02 ENCOUNTER — Ambulatory Visit (INDEPENDENT_AMBULATORY_CARE_PROVIDER_SITE_OTHER): Payer: Medicare Other | Admitting: Cardiology

## 2022-04-02 ENCOUNTER — Encounter: Payer: Self-pay | Admitting: Cardiology

## 2022-04-02 VITALS — BP 130/62 | HR 70 | Ht 61.0 in | Wt 137.0 lb

## 2022-04-02 DIAGNOSIS — D689 Coagulation defect, unspecified: Secondary | ICD-10-CM | POA: Diagnosis not present

## 2022-04-02 DIAGNOSIS — I471 Supraventricular tachycardia: Secondary | ICD-10-CM

## 2022-04-02 NOTE — Assessment & Plan Note (Signed)
Continue with Toprol-XL.  Occasionally she will have a fullness like sensation but she has not had any racing heartbeat in quite some time.  Toprol seems to be doing a good job at 50 mg for medical management.  Continue to refill. ?

## 2022-04-02 NOTE — Assessment & Plan Note (Signed)
Prior PE DVT in 2014.  She was positive for prothrombin gene mutation heterozygous.  Continue with Xarelto long-term.  No bleeding problems normal hemoglobin normal creatinine 12.9, 0.9 ?

## 2022-04-02 NOTE — Progress Notes (Signed)
?Cardiology Office Note:   ? ?Date:  04/02/2022  ? ?ID:  Jessica Wells, DOB [birth year], MRN [id number] ? ?PCP:  Aretta Nip, MD ?  ?[facility] Providers ?Cardiologist:  Candee Furbish, MD    ? ?Referring MD: Aretta Nip, MD  ? ? ?History of Present Illness:   ? ?Jessica Wells is a 76 y.o. female here for follow-up, PSVT, paroxysmal atrial tachycardia with prior DVT right-sided inflammatory breast cancer chronic anticoagulation previously seen by Dr. Haroldine Laws on Herceptin previously with right arm lymphedema Ace wraps, stage Ia non-small cell lung cancer adenocarcinoma right upper lobe nodule followed by Dr. Julien Nordmann. ? ? ? ?Past Medical History:  ?Diagnosis Date  ? Allergic reaction to alpha-gal   ? Allergy   ? Anemia   ? "after chemo"  ? Arthritis   ? "knees; fingers; occasionally" (05/10/2014)  ? Asthma   ? Uses Inhalers Proventil as needed  ? Breast cancer ([place]) 07/14/12  ? inflammatory right breast ca, ER/PR -  ? Bursitis   ? "both shoulders"  ? Clotting disorder ([place])   ? prothrombin gene mutation-heterozygous   ? DVT (deep venous thrombosis) ([place]) 12/22/2012  ? BLE  ? Dysrhythmia   ? PAT-sees dr Lina Sayre meds  ? Family history of breast cancer 04/04/2021  ? Family history of lung cancer 04/04/2021  ? GERD (gastroesophageal reflux disease)   ? H/O hiatal hernia   ? History of blood transfusion 01-25-13  ? 2 units 01-11-13 ("after chem")  ? History of chemotherapy   ? Last dose to be 02-04-13.  Was rx'd with Herceptin & Gemzar  ? Lymphedema   ? RUE  ? Osteopenia   ? "lower spine only" (05/10/2014)  ? PAT (paroxysmal atrial tachycardia) (HCC)   ? hx  ? PE (pulmonary thromboembolism) ([place]) 12/27/2012  ? Pneumonia 01-25-13  ? hx. 12-27-12-hospital stay x 9 days, now resolved.  ? Pneumonia   ? "episodic; all my life" (05/10/2014)  ? PONV (postoperative nausea and vomiting) 09-13-12  ? severe, with Port-a-cath, was managed without PONV  ? S/P radiation therapy 4-12 wks ago 03/31/13-05/17/13  ? right breast/supraclavicular  fossa/posterior axillary boost  ? ? ?Past Surgical History:  ?Procedure Laterality Date  ? BREAST BIOPSY Right 2013 X 3  ? CHALAZION EXCISION Right 1980's  ? right( MD office)  ? COLONOSCOPY    ? [place] OF UTERUS  1998 X 3  ? INSERTION OF VENA CAVA FILTER  12/2012  ? INTERCOSTAL NERVE BLOCK Right 01/30/2021  ? Procedure: INTERCOSTAL NERVE BLOCK;  Surgeon: [name], MD;  Location: [place];  Service: Thoracic;  Laterality: Right;  ? MASTECTOMY Left 05/10/2014  ? PROPHYLACTIC   ? MASTECTOMY MODIFIED RADICAL Right 02/08/2013  ? Procedure: MASTECTOMY MODIFIED RADICAL;  Surgeon: Rolm Bookbinder, MD;  [facility];  Service: General;  Laterality: Right;  ? MASTECTOMY MODIFIED RADICAL Right   ? NODE DISSECTION Right 01/30/2021  ? Procedure: NODE DISSECTION;  Surgeon: [name], MD;  Location: [facility];  Service: Thoracic;  Laterality: Right;  ? NODE DISSECTION (Right)  01/30/2021  ? PORT-A-CATH REMOVAL  05/10/2014  ? PORT-A-CATH REMOVAL N/A 05/10/2014  ? Procedure: REMOVAL PORT-A-CATH;  Surgeon: Rolm Bookbinder, MD;  Location: [place];  Service: General;  Laterality: N/A;  ? PORTACATH PLACEMENT  07/21/2012  ? Procedure: INSERTION PORT-A-CATH;  Surgeon: Rolm Bookbinder, MD;  Location: [place];  Service: General;  Laterality: Left;/ Replacement done 10'13  ? SIMPLE MASTECTOMY WITH AXILLARY SENTINEL  NODE BIOPSY Left 05/10/2014  ? Procedure: PROPHYLACTIC LEFT MASTECTOMY;  Surgeon: Rolm Bookbinder, MD;  Location: [place];  Service: General;  Laterality: Left;  ? TONSILLECTOMY    ? TUBAL LIGATION  ~ 1974  ? TUMOR EXCISION Right 1970  ? giant cell, off my forearm"  ? VIDEO BRONCHOSCOPY WITH ENDOBRONCHIAL NAVIGATION N/A 01/30/2021  ? Procedure: VIDEO BRONCHOSCOPY WITH ENDOBRONCHIAL NAVIGATION FOR TUMOR MARKING;  Surgeon: [name], MD;  Location: [facility];  Service: Thoracic;  Laterality: N/A;  ? VIDEO BRONCHOSCOPY WITH ENDOBRONCHIAL NAVIGATION FOR TUMOR MARKING (N/A)   01/30/2021  ? XI ROBOTIC ASSISTED THORACOSCOPY- SEGMENTECTOMY Right 01/30/2021  ? Procedure: XI ROBOTIC ASSISTED THORACOSCOPY-POSTERIOR SEGMENTECTOMY RIGHT UPPER LOBE;  Surgeon: [name], MD;  Location: [facility] OR;  Service: Thoracic;  Laterality: Right;  ? XI ROBOTIC ASSISTED THORACOSCOPY-POSTERIOR SEGMENTECTOMY RIGHT UPPER LOBE (Right)  01/30/2021  ? ? ?Current Medications: ?Current Meds  ?Medication Sig  ? albuterol (VENTOLIN HFA) 108 (90 Base) MCG/ACT inhaler INHALE 4 PUFFS BY MOUTH EVERY 4 TO 6 HOURS AS NEEDED FOR COUGH AND FOR WHEEZING .USE  WITH  SPACER.  ? atorvastatin (LIPITOR) 10 MG tablet Take 10 mg by mouth daily.  ? B Complex-C (SUPER B COMPLEX PO) Take 1 tablet by mouth at bedtime.   ? calcium-vitamin D (OSCAL WITH D) 500-200 MG-UNIT per tablet Take 1 tablet by mouth every other day.   ? COVID-19 mRNA bivalent vaccine, Pfizer, (PFIZER COVID-19 VAC BIVALENT) injection Inject into the muscle.  ? COVID-19 mRNA Vac-TriS, Pfizer, (PFIZER-BIONT COVID-19 VAC-TRIS) SUSP injection Inject into the muscle.  ? EPINEPHrine 0.3 mg/0.3 mL IJ SOAJ injection Use as directed for severe allergic reaction  ? fluticasone (FLONASE) 50 MCG/ACT nasal spray Place 1 spray into both nostrils as needed for allergies or rhinitis.  ? glucosamine-chondroitin 500-400 MG tablet Take 1 tablet by mouth at bedtime.   ? lisinopril (PRINIVIL,ZESTRIL) 2.5 MG tablet Take 2.5 mg by mouth every evening.   ? Magnesium 250 MG TABS Take 250 mg by mouth at bedtime as needed (leg cramps).  ? metoprolol succinate (TOPROL-XL) 50 MG 24 hr tablet Take 1 tablet by mouth once daily  ? Multiple Vitamin (MULTIVITAMIN WITH MINERALS) TABS Take 1 tablet by mouth every other day.   ? omeprazole (PRILOSEC) 20 MG capsule Take 1 capsule (20 mg total) by mouth daily. For heartburn  ? SYMBICORT 160-4.5 MCG/ACT inhaler INHALE 2 PUFFS BY MOUTH TWICE DAILY USE WITH SPACER  ? XARELTO 20 MG TABS tablet TAKE 1 TABLET BY MOUTH ONCE DAILY WITH SUPPER  ?   ? ?Allergies:   Alpha-gal; Anesthetics, amide; Sulfa antibiotics; Sulfamethoxazole-trimethoprim; Anesthesia s-i-40 [propofol]; Codeine; Codeine phosphate; Eggs or egg-derived products; Fish allergy; Milk-related compounds; and Other  ? ?Social History  ? ?Socioeconomic History  ? Marital status: Divorced  ?  Spouse name: Not on file  ? Number of children: 1  ? Years of education: Not on file  ? Highest education level: Not on file  ?Occupational History  ?  Employer: RETIRED  ?  Comment: retired Brewing technologist  ?Tobacco Use  ? Smoking status: Former  ?  Packs/day: 1.00  ?  Years: 15.00  ?  Pack years: 15.00  ?  Types: Cigarettes  ?  Quit date: 07/21/1979  ?  Years since quitting: 42.7  ? Smokeless tobacco: Never  ?Vaping Use  ? Vaping Use: Never used  ?Substance and Sexual Activity  ? Alcohol use: No  ? Drug use: No  ? Sexual activity: Not  Currently  ?  Comment: menarche age 52, P 1, menopause age 40, HRT x 3-5 yrs, low dosage  ?Other Topics Concern  ? Not on file  ?Social History Narrative  ? Not on file  ? ?Social Determinants of Health  ? ?Financial Resource Strain: Not on file  ?Food Insecurity: Not on file  ?Transportation Needs: Not on file  ?Physical Activity: Not on file  ?Stress: Not on file  ?Social Connections: Not on file  ?  ? ?Family History: ?The patient's family history includes Allergic rhinitis in her father, sister, and son; Asthma in her father, sister, and son; Breast cancer (age of onset: 63) in her niece; Breast cancer (age of onset: 7) in her mother; Cancer in her maternal grandmother and paternal uncle; Diabetes in her mother; Heart disease in her maternal aunt, maternal grandmother, maternal uncle, and mother; Hyperlipidemia in her mother; Hypertension in her mother; Lung cancer (age of onset: 43) in her father and sister; Skin cancer in her father. There is no history of Angioedema, Eczema, Immunodeficiency, or Urticaria. ? ?ROS:   ?Please see the history of present illness.    ?No major  lymphedema of left arm.  She has had this issue in the past.  Uses Ace wraps for control.  All other systems reviewed and are negative. ? ?EKGs/Labs/Other Studies Reviewed:   ? ?The following studies were reviewed today: ?

## 2022-04-02 NOTE — Patient Instructions (Signed)
Medication Instructions:  ?Your physician recommends that you continue on your current medications as directed. Please refer to the Current Medication list given to you today. ?*If you need a refill on your cardiac medications before your next appointment, please call your pharmacy* ? ? ?Lab Work: ?None Ordered ? ? ?Testing/Procedures: ?None Ordered ? ? ?Follow-Up: ?At [facility], you and your health needs are our priority.  As part of our continuing mission to provide you with exceptional heart care, we have created designated Provider Care Teams.  These Care Teams include your primary Cardiologist (physician) and Advanced Practice Providers (APPs -  Physician Assistants and Nurse Practitioners) who all work together to provide you with the care you need, when you need it. ? ?We recommend signing up for the patient portal called "MyChart".  Sign up information is provided on this After Visit Summary.  MyChart is used to connect with patients for Virtual Visits (Telemedicine).  Patients are able to view lab/test results, encounter notes, upcoming appointments, etc.  Non-urgent messages can be sent to your provider as well.   ?To learn more about what you can do with MyChart, go to [email or web address].   ? ?Your next appointment:   ?12 month(s) ? ?The format for your next appointment:   ?In Person ? ?Provider:   ?Candee Furbish, MD   ? ? ?Other Instructions ? ? ?Important Information About Sugar ? ? ? ? ?  ?

## 2022-04-09 DIAGNOSIS — E1169 Type 2 diabetes mellitus with other specified complication: Secondary | ICD-10-CM | POA: Diagnosis not present

## 2022-04-09 DIAGNOSIS — E7800 Pure hypercholesterolemia, unspecified: Secondary | ICD-10-CM | POA: Diagnosis not present

## 2022-04-15 ENCOUNTER — Other Ambulatory Visit: Payer: Self-pay | Admitting: Cardiology

## 2022-04-18 DIAGNOSIS — M8589 Other specified disorders of bone density and structure, multiple sites: Secondary | ICD-10-CM | POA: Diagnosis not present

## 2022-05-01 ENCOUNTER — Encounter: Payer: Self-pay | Admitting: Allergy & Immunology

## 2022-05-01 ENCOUNTER — Ambulatory Visit (INDEPENDENT_AMBULATORY_CARE_PROVIDER_SITE_OTHER): Payer: Medicare Other | Admitting: Allergy & Immunology

## 2022-05-01 VITALS — BP 124/62 | HR 71 | Temp 98.4°F | Resp 18

## 2022-05-01 DIAGNOSIS — T7800XD Anaphylactic reaction due to unspecified food, subsequent encounter: Secondary | ICD-10-CM | POA: Diagnosis not present

## 2022-05-01 DIAGNOSIS — J4540 Moderate persistent asthma, uncomplicated: Secondary | ICD-10-CM

## 2022-05-01 DIAGNOSIS — J3089 Other allergic rhinitis: Secondary | ICD-10-CM

## 2022-05-01 MED ORDER — ALBUTEROL SULFATE HFA 108 (90 BASE) MCG/ACT IN AERS: INHALATION_SPRAY | RESPIRATORY_TRACT | 1 refills | Status: DC

## 2022-05-01 MED ORDER — FLUTICASONE FUROATE-VILANTEROL 200-25 MCG/ACT IN AEPB: 1.0000 | INHALATION_SPRAY | Freq: Every day | RESPIRATORY_TRACT | 5 refills | Status: DC | PRN

## 2022-05-01 NOTE — Progress Notes (Unsigned)
FOLLOW UP  Date of Service/Encounter:  05/01/22   Assessment:   Moderate persistent asthma, uncomplicated   Seasonal and perennial allergic rhinitis   Alpha-gal sensitivity - with an IgE 16.40 and sensitivity to mammalian products including milk   History of breast cancer (nearly 7 years clean)    Adenocardinoma of the lungs - slowly growing with a lobectomy schedule for March 2nd   Fully vaccinated to COVID-19, including one booster (qualifies for a 4th dose)  Plan/Recommendations:    There are no Patient Instructions on file for this visit.   Subjective:   Jessica Wells is a 76 y.o. female presenting today for follow up of No chief complaint on file.   Jessica Wells has a history of the following: Patient Active Problem List   Diagnosis Date Noted   Genetic testing 10/08/2021   Family history of breast cancer 04/04/2021   Family history of lung cancer 04/04/2021   Adenocarcinoma of lung, stage 1, right ([place]) 02/02/2021   Seasonal and perennial allergic rhinitis 09/17/2017   S/P mastectomy 05/10/2014   Cancer of central portion of right female breast ([place]) 09/23/2013   Pulmonary nodule 01/03/2013   Acute respiratory failure with hypoxia ([place]) 12/29/2012   Hypokalemia 12/29/2012   Pulmonary embolism ([place]) 12/28/2012   Healthcare-associated pneumonia 12/27/2012   Acute respiratory distress 12/27/2012   Asthma with COPD ([place])    GERD (gastroesophageal reflux disease)    Dysrhythmia    Allergy    Arthritis    Clotting disorder ([place])    History of chemotherapy    DVT (deep venous thrombosis) ([place])    DVT of lower extremity, bilateral ([place]) 12/22/2012   PSVT (paroxysmal supraventricular tachycardia) ([place]) 10/14/2012   PONV (postoperative nausea and vomiting) 09/13/2012   PAT (paroxysmal atrial tachycardia) ([place])     History obtained from: chart review and {Persons; PED relatives w/patient:19415::"patient"}.  Jessica Wells is a 76 y.o. female presenting for {Blank  single:19197::"a food challenge","a drug challenge","skin testing","a sick visit","an evaluation of ***","a follow up visit"}.  Since the last visit, she has done well. She did have the surgery  for her lnug cancer at the last visit. She has had two chest CTs and everything is looking great. THis has been discovered from her screening for her history of breast cancer.   Asthma/Respiratory Symptom History: She does not seem Pulmonology. She is doing Symnbicort one puff once daily. She thinks that she does not need it now, but she reports that she definitely needs it when she gets a cold. She has not had any cold that has triggered her asthma in quite some time. She has not had any SOB or antything that is definitely related to her SOB.   Allergic Rhinitis Symptom History: She stopped using her Flonase in October. She has only had to use it a couple of times for the most part.   Food Allergy Symptom History: She continues to avoid red meat. She has not had any reactions as long as she avoids it.  She actually can tolerate a bit of the of the pork in hot dogs. She has decreased her milk intake as well.   She reports that she is watching everything on the diet to replace wheat. She thinks that she reacted to a cookie that she had containing chicory or chickpea flour. She ate a cookie and had a reaction through the night and overnight. She treated with antihistamines for one week. She did not go to the hospital for thee symptoms.  {  Blank single:19197::"Skin Symptom History: ***"," "}  {Blank single:19197::"GERD Symptom History: ***"," "}  Otherwise, there have been no changes to her past medical history, surgical history, family history, or social history.    ROS     Objective:   There were no vitals taken for this visit. There is no height or weight on file to calculate BMI.    Physical Exam   Diagnostic studies: {Blank single:19197::"none","deferred due to recent antihistamine  use","labs sent instead"," "}  Spirometry: {Blank single:19197::"results normal (FEV1: ***%, FVC: ***%, FEV1/FVC: ***%)","results abnormal (FEV1: ***%, FVC: ***%, FEV1/FVC: ***%)"}.    {Blank single:19197::"Spirometry consistent with mild obstructive disease","Spirometry consistent with moderate obstructive disease","Spirometry consistent with severe obstructive disease","Spirometry consistent with possible restrictive disease","Spirometry consistent with mixed obstructive and restrictive disease","Spirometry uninterpretable due to technique","Spirometry consistent with normal pattern"}. {Blank single:19197::"Albuterol/Atrovent nebulizer","Xopenex/Atrovent nebulizer","Albuterol nebulizer","Albuterol four puffs via MDI","Xopenex four puffs via MDI"} treatment given in clinic with {Blank single:19197::"significant improvement in FEV1 per ATS criteria","significant improvement in FVC per ATS criteria","significant improvement in FEV1 and FVC per ATS criteria","improvement in FEV1, but not significant per ATS criteria","improvement in FVC, but not significant per ATS criteria","improvement in FEV1 and FVC, but not significant per ATS criteria","no improvement"}.  Allergy Studies: {Blank single:19197::"none","labs sent instead"," "}    {Blank single:19197::"Allergy testing results were read and interpreted by myself, documented by clinical staff."," "}      Salvatore Marvel, MD  Allergy and [place] [facility]

## 2022-05-01 NOTE — Patient Instructions (Addendum)
1. Mild persistent asthma, uncomplicated - Lung testing not done today.  - Let's stop and your controller medication and just use it when you are sick.  - We will change you from Symbicort to [facility] (it says that it is preferred, so we will see). - Daily controller medication(s): NOTHING - Prior to physical activity: ProAir 2 puffs 10-15 minutes before physical activity. - Rescue medications: ProAir 4 puffs every 4-6 hours as needed - During respiratory flares: START Breo 233mcg one puff once daily for 1-2 weeks - Asthma control goals:  * Full participation in all desired activities (may need albuterol before activity) * Albuterol use two time or less a week on average (not counting use with activity) * Cough interfering with sleep two time or less a month * Oral steroids no more than once a year * No hospitalizations  2. Chronic allergic rhinitis (grasses, weeds, molds, dust mite, cat, dog, feathers, horse, cockroach) - Continue with Flonase 1 spray per nostril daily. - Continue with antihistamines daily as needed.  3. Intense pruritis - with alpha gal sensitivity as well as wheat, milk, tree nuts, egg, chickpea  - Continue to avoid red meat as you are doing.  - Continue with avoidance of your other triggering foods including milk and chickpea. - We are going to get a chickpea allergy level and a sunflower allergy level.  - We will call you in 1-2 weeks with the results of the testing.   4. Return in about 6 months (around 10/31/2022).  Please inform us of any Emergency Department visits, hospitalizations, or changes in symptoms. Call us before going to the ED for breathing or allergy symptoms since we might be able to fit you in for a sick visit. Feel free to contact us anytime with any questions, problems, or concerns.  It was a pleasure to see you again today!  Websites that have reliable patient information: 1. American Academy of Asthma, Allergy, and Immunology: www.aaaai.org 2.  Food Allergy Research and Education (FARE): foodallergy.org 3. Mothers of Asthmatics: http://www.asthmacommunitynetwork.org 4. American College of Allergy, Asthma, and Immunology: www.acaai.org   COVID-19 Vaccine Information can be found at: ShippingScam.co.uk For questions related to vaccine distribution or appointments, please email [email or web address] or call [phone].   We realize that you might be concerned about having an allergic reaction to the COVID19 vaccines. To help with that concern, WE ARE OFFERING THE COVID19 VACCINES IN OUR OFFICE! Ask the front desk for dates!     "Like" [place] on Facebook and Instagram for our latest updates!       Make sure you are registered to vote! If you have moved or changed any of your contact information, you will need to get this updated before voting!  In some cases, you MAY be able to register to vote online: [email or web address]

## 2022-05-02 ENCOUNTER — Telehealth: Payer: Self-pay

## 2022-05-02 MED ORDER — FLUTICASONE FUROATE-VILANTEROL 100-25 MCG/ACT IN AEPB: 1.0000 | INHALATION_SPRAY | Freq: Every day | RESPIRATORY_TRACT | 1 refills | Status: DC

## 2022-05-02 MED ORDER — FLUTICASONE FUROATE-VILANTEROL 200-25 MCG/ACT IN AEPB: 1.0000 | INHALATION_SPRAY | Freq: Every day | RESPIRATORY_TRACT | 5 refills | Status: DC | PRN

## 2022-05-02 NOTE — Addendum Note (Signed)
Addended by: Eloy End D on: 05/02/2022 01:59 PM   Modules accepted: Orders

## 2022-05-02 NOTE — Telephone Encounter (Signed)
We just changed to the Breo 100 mcg dose since that is an alternative.

## 2022-05-02 NOTE — Telephone Encounter (Signed)
PA information: Key: [id number] - PA Case ID: [id number] - Rx #: [id number]

## 2022-05-02 NOTE — Telephone Encounter (Signed)
Office received a fax stating that Fluticasone Furoate- Vilanterol 200-25 mcg Aerosol powder has been rejected and requires prior authorization. Went to cover my meds to start process. Preferred alternatives are Brand name Breo Ellipta 100, Airmonair Digihaler, Fluticasone Propionate/ Salmeterol, and Pulmicort Flexhaler. Will addend medication to brand name Breo 200 and route back to Dr. Ernst Bowler.

## 2022-05-02 NOTE — Addendum Note (Signed)
Addended by: Tommas Olp B on: 05/02/2022 05:03 PM   Modules accepted: Orders

## 2022-05-04 LAB — ALLERGEN, CHICK PEA, RF309, IGE: F309-IgE Chick Pea: 0.53 kU/L — AB

## 2022-05-04 LAB — ALLERGEN SUNFLOWER SEED K84: Sunflower Seed k84: 4.34 kU/L — AB

## 2022-08-19 DIAGNOSIS — E1169 Type 2 diabetes mellitus with other specified complication: Secondary | ICD-10-CM | POA: Diagnosis not present

## 2022-08-19 DIAGNOSIS — J4540 Moderate persistent asthma, uncomplicated: Secondary | ICD-10-CM | POA: Diagnosis not present

## 2022-08-19 DIAGNOSIS — M8580 Other specified disorders of bone density and structure, unspecified site: Secondary | ICD-10-CM | POA: Diagnosis not present

## 2022-08-19 DIAGNOSIS — E7800 Pure hypercholesterolemia, unspecified: Secondary | ICD-10-CM | POA: Diagnosis not present

## 2022-09-01 ENCOUNTER — Other Ambulatory Visit: Payer: Self-pay | Admitting: Hematology and Oncology

## 2022-09-01 DIAGNOSIS — Z23 Encounter for immunization: Secondary | ICD-10-CM | POA: Diagnosis not present

## 2022-09-01 DIAGNOSIS — C50111 Malignant neoplasm of central portion of right female breast: Secondary | ICD-10-CM

## 2022-09-04 ENCOUNTER — Other Ambulatory Visit: Payer: Self-pay | Admitting: *Deleted

## 2022-09-04 DIAGNOSIS — C50111 Malignant neoplasm of central portion of right female breast: Secondary | ICD-10-CM

## 2022-09-04 MED ORDER — RIVAROXABAN 20 MG PO TABS: 20.0000 mg | ORAL_TABLET | Freq: Every day | ORAL | 0 refills | Status: DC

## 2022-09-22 ENCOUNTER — Inpatient Hospital Stay: Payer: Medicare Other | Attending: Internal Medicine

## 2022-09-22 ENCOUNTER — Other Ambulatory Visit: Payer: Self-pay | Admitting: Lab

## 2022-09-22 DIAGNOSIS — Z7901 Long term (current) use of anticoagulants: Secondary | ICD-10-CM | POA: Diagnosis not present

## 2022-09-22 DIAGNOSIS — Z801 Family history of malignant neoplasm of trachea, bronchus and lung: Secondary | ICD-10-CM | POA: Insufficient documentation

## 2022-09-22 DIAGNOSIS — Z803 Family history of malignant neoplasm of breast: Secondary | ICD-10-CM | POA: Insufficient documentation

## 2022-09-22 DIAGNOSIS — Z923 Personal history of irradiation: Secondary | ICD-10-CM | POA: Insufficient documentation

## 2022-09-22 DIAGNOSIS — C3490 Malignant neoplasm of unspecified part of unspecified bronchus or lung: Secondary | ICD-10-CM

## 2022-09-22 DIAGNOSIS — Z86718 Personal history of other venous thrombosis and embolism: Secondary | ICD-10-CM | POA: Diagnosis not present

## 2022-09-22 DIAGNOSIS — Z853 Personal history of malignant neoplasm of breast: Secondary | ICD-10-CM | POA: Insufficient documentation

## 2022-09-22 DIAGNOSIS — I10 Essential (primary) hypertension: Secondary | ICD-10-CM | POA: Insufficient documentation

## 2022-09-22 DIAGNOSIS — C3411 Malignant neoplasm of upper lobe, right bronchus or lung: Secondary | ICD-10-CM | POA: Diagnosis not present

## 2022-09-22 LAB — CBC WITH DIFFERENTIAL (CANCER CENTER ONLY)
Abs Immature Granulocytes: 0 10*3/uL (ref 0.00–0.07)
Basophils Absolute: 0.1 10*3/uL (ref 0.0–0.1)
Basophils Relative: 1 %
Eosinophils Absolute: 0.3 10*3/uL (ref 0.0–0.5)
Eosinophils Relative: 6 %
HCT: 36.6 % (ref 36.0–46.0)
Hemoglobin: 12.4 g/dL (ref 12.0–15.0)
Immature Granulocytes: 0 %
Lymphocytes Relative: 35 %
Lymphs Abs: 1.7 10*3/uL (ref 0.7–4.0)
MCH: 30.8 pg (ref 26.0–34.0)
MCHC: 33.9 g/dL (ref 30.0–36.0)
MCV: 91 fL (ref 80.0–100.0)
Monocytes Absolute: 0.5 10*3/uL (ref 0.1–1.0)
Monocytes Relative: 10 %
Neutro Abs: 2.3 10*3/uL (ref 1.7–7.7)
Neutrophils Relative %: 48 %
Platelet Count: 154 10*3/uL (ref 150–400)
RBC: 4.02 MIL/uL (ref 3.87–5.11)
RDW: 12.3 % (ref 11.5–15.5)
WBC Count: 4.8 10*3/uL (ref 4.0–10.5)
nRBC: 0 % (ref 0.0–0.2)

## 2022-09-22 LAB — CMP (CANCER CENTER ONLY)
ALT: 16 U/L (ref 0–44)
AST: 16 U/L (ref 15–41)
Albumin: 4.1 g/dL (ref 3.5–5.0)
Alkaline Phosphatase: 48 U/L (ref 38–126)
Anion gap: 4 — ABNORMAL LOW (ref 5–15)
BUN: 20 mg/dL (ref 8–23)
CO2: 29 mmol/L (ref 22–32)
Calcium: 9.4 mg/dL (ref 8.9–10.3)
Chloride: 105 mmol/L (ref 98–111)
Creatinine: 0.92 mg/dL (ref 0.44–1.00)
GFR, Estimated: >60 mL/min (ref >60)
Glucose, Bld: 116 mg/dL — ABNORMAL HIGH (ref 70–99)
Potassium: 4.1 mmol/L (ref 3.5–5.1)
Sodium: 138 mmol/L (ref 135–145)
Total Bilirubin: 0.6 mg/dL (ref 0.3–1.2)
Total Protein: 6.8 g/dL (ref 6.5–8.1)

## 2022-09-23 ENCOUNTER — Encounter (HOSPITAL_COMMUNITY): Payer: Self-pay

## 2022-09-23 ENCOUNTER — Ambulatory Visit (HOSPITAL_COMMUNITY)
Admission: RE | Admit: 2022-09-23 | Discharge: 2022-09-23 | Disposition: A | Discharge status: Home / Self Care | Payer: Medicare Other | Source: Ambulatory Visit | Attending: Internal Medicine | Admitting: Internal Medicine

## 2022-09-23 DIAGNOSIS — C3490 Malignant neoplasm of unspecified part of unspecified bronchus or lung: Secondary | ICD-10-CM | POA: Insufficient documentation

## 2022-09-23 DIAGNOSIS — R918 Other nonspecific abnormal finding of lung field: Secondary | ICD-10-CM | POA: Diagnosis not present

## 2022-09-23 MED ORDER — SODIUM CHLORIDE (PF) 0.9 % IJ SOLN
INTRAMUSCULAR | Status: AC
  Filled 2022-09-23: qty 50

## 2022-09-23 MED ORDER — IOHEXOL 300 MG/ML  SOLN
75.0000 mL | Freq: Once | INTRAMUSCULAR | Status: AC | PRN
  Administered 2022-09-23: 75 mL via INTRAVENOUS

## 2022-09-25 ENCOUNTER — Inpatient Hospital Stay (HOSPITAL_BASED_OUTPATIENT_CLINIC_OR_DEPARTMENT_OTHER): Payer: Medicare Other | Admitting: Internal Medicine

## 2022-09-25 VITALS — BP 171/77 | HR 64 | Temp 97.9°F | Resp 18 | Ht 61.0 in

## 2022-09-25 DIAGNOSIS — C3490 Malignant neoplasm of unspecified part of unspecified bronchus or lung: Secondary | ICD-10-CM | POA: Diagnosis not present

## 2022-09-25 DIAGNOSIS — Z86718 Personal history of other venous thrombosis and embolism: Secondary | ICD-10-CM | POA: Diagnosis not present

## 2022-09-25 DIAGNOSIS — Z853 Personal history of malignant neoplasm of breast: Secondary | ICD-10-CM | POA: Diagnosis not present

## 2022-09-25 DIAGNOSIS — Z7901 Long term (current) use of anticoagulants: Secondary | ICD-10-CM | POA: Diagnosis not present

## 2022-09-25 DIAGNOSIS — C3411 Malignant neoplasm of upper lobe, right bronchus or lung: Secondary | ICD-10-CM | POA: Diagnosis not present

## 2022-09-25 DIAGNOSIS — Z803 Family history of malignant neoplasm of breast: Secondary | ICD-10-CM | POA: Diagnosis not present

## 2022-09-25 DIAGNOSIS — I10 Essential (primary) hypertension: Secondary | ICD-10-CM | POA: Diagnosis not present

## 2022-09-25 NOTE — Progress Notes (Signed)
[place] [phone]   [phone]  OFFICE PROGRESS NOTE  Rankins, Bill Salinas, MD [place] [address]  DIAGNOSIS:  1) Stage IA (T1 a, N0, M0) non-small cell lung cancer, adenocarcinoma presented with right upper lobe nodule  2) A history of inflammatory breast cancer involving the right breast, HR negative and HER2 positive status post neoadjuvant chemotherapy followed by right mastectomy followed by adjuvant therapy and she was followed by Dr. Humphrey Rolls and Dr. Lindi Adie.Marland Kitchen  PRIOR THERAPY: status post right upper lobe superior segmentectomy with lymph node dissection under the care of Dr. Roxan Hockey on January 30, 2021.  CURRENT THERAPY: Observation.   INTERVAL HISTORY: Jessica Wells 76 y.o. female returns to the clinic today for 6 months follow-up visit.  The patient is feeling fine today with no concerning complaints.  She denied having any chest pain, shortness of breath, cough or hemoptysis.  She has no nausea, vomiting, diarrhea or constipation.  She has no headache or visual changes.  She denied having any recent weight loss or night sweats.  She is here today for evaluation and repeat CT scan of the chest for restaging of her disease.  She has a history of deep venous thrombosis and heterozygous prothrombin gene mutation and she is currently on lifelong Xarelto.  She is establishing care with new primary care physician soon.   MEDICAL HISTORY: Past Medical History:  Diagnosis Date   Allergic reaction to alpha-gal    Allergy    Anemia    "after chemo"   Arthritis    "knees; fingers; occasionally" (05/10/2014)   Asthma    Uses Inhalers Proventil as needed   Breast cancer ([place]) 07/14/12   inflammatory right breast ca, ER/PR -   Bursitis    "both shoulders"   Clotting disorder ([place])    prothrombin gene mutation-heterozygous    DVT (deep venous thrombosis) ([place]) 12/22/2012   BLE   Dysrhythmia    PAT-sees dr Lina Sayre meds    Family history of breast cancer 04/04/2021   Family history of lung cancer 04/04/2021   GERD (gastroesophageal reflux disease)    H/O hiatal hernia    History of blood transfusion 01-25-13   2 units 01-11-13 ("after chem")   History of chemotherapy    Last dose to be 02-04-13.  Was rx'd with Herceptin & Gemzar   Lymphedema    RUE   Osteopenia    "lower spine only" (05/10/2014)   PAT (paroxysmal atrial tachycardia)    hx   PE (pulmonary thromboembolism) ([place]) 12/27/2012   Pneumonia 01-25-13   hx. 12-27-12-hospital stay x 9 days, now resolved.   Pneumonia    "episodic; all my life" (05/10/2014)   PONV (postoperative nausea and vomiting) 09-13-12   severe, with Port-a-cath, was managed without PONV   S/P radiation therapy 4-12 wks ago 03/31/13-05/17/13   right breast/supraclavicular fossa/posterior axillary boost    ALLERGIES:  is allergic to alpha-gal; anesthetics, amide; sulfa antibiotics; sulfamethoxazole-trimethoprim; anesthesia s-i-40 [propofol]; codeine; codeine phosphate; eggs or egg-derived products; fish allergy; milk-related compounds; and other.  MEDICATIONS:  Current Outpatient Medications  Medication Sig Dispense Refill   albuterol (VENTOLIN HFA) 108 (90 Base) MCG/ACT inhaler INHALE 4 PUFFS BY MOUTH EVERY 4 TO 6 HOURS AS NEEDED FOR COUGH AND FOR WHEEZING .USE  WITH  SPACER. 18 g 1   atorvastatin (LIPITOR) 10 MG tablet Take 10 mg by mouth daily.     B Complex-C (SUPER B COMPLEX PO)  Take 1 tablet by mouth at bedtime.      calcium-vitamin D (OSCAL WITH D) 500-200 MG-UNIT per tablet Take 1 tablet by mouth every other day.      COVID-19 mRNA bivalent vaccine, Pfizer, (PFIZER COVID-19 VAC BIVALENT) injection Inject into the muscle. 0.3 mL 0   COVID-19 mRNA Vac-TriS, Pfizer, (PFIZER-BIONT COVID-19 VAC-TRIS) SUSP injection Inject into the muscle. 0.3 mL 0   EPINEPHrine 0.3 mg/0.3 mL IJ SOAJ injection Use as directed for severe allergic reaction 2 each 1   fluticasone (FLONASE) 50 MCG/ACT  nasal spray Place 1 spray into both nostrils as needed for allergies or rhinitis. 16 g 5   fluticasone furoate-vilanterol (BREO ELLIPTA) 100-25 MCG/ACT AEPB Inhale 1 puff into the lungs daily. 60 each 1   glucosamine-chondroitin 500-400 MG tablet Take 1 tablet by mouth at bedtime.      lisinopril (PRINIVIL,ZESTRIL) 2.5 MG tablet Take 2.5 mg by mouth every evening.      Magnesium 250 MG TABS Take 250 mg by mouth at bedtime as needed (leg cramps).     metoprolol succinate (TOPROL-XL) 50 MG 24 hr tablet Take 1 tablet by mouth once daily 90 tablet 3   Multiple Vitamin (MULTIVITAMIN WITH MINERALS) TABS Take 1 tablet by mouth every other day.      omeprazole (PRILOSEC) 20 MG capsule Take 1 capsule (20 mg total) by mouth daily. For heartburn 30 capsule 5   rivaroxaban (XARELTO) 20 MG TABS tablet Take 1 tablet (20 mg total) by mouth daily with supper. 90 tablet 0   No current facility-administered medications for this visit.    SURGICAL HISTORY:  Past Surgical History:  Procedure Laterality Date   BREAST BIOPSY Right 2013 X 3   CHALAZION EXCISION Right 1980's   right( MD office)   COLONOSCOPY     DILATION AND CURETTAGE OF UTERUS  1998 X 3   INSERTION OF VENA CAVA FILTER  12/2012   INTERCOSTAL NERVE BLOCK Right 01/30/2021   Procedure: INTERCOSTAL NERVE BLOCK;  Surgeon: [name], MD;  Location: [facility];  Service: Thoracic;  Laterality: Right;   MASTECTOMY Left 05/10/2014   PROPHYLACTIC    MASTECTOMY MODIFIED RADICAL Right 02/08/2013   Procedure: MASTECTOMY MODIFIED RADICAL;  Surgeon: Rolm Bookbinder, MD;  Location: [facility];  Service: General;  Laterality: Right;   MASTECTOMY MODIFIED RADICAL Right    NODE DISSECTION Right 01/30/2021   Procedure: NODE DISSECTION;  Surgeon: [name], MD;  Location: [facility];  Service: Thoracic;  Laterality: Right;   NODE DISSECTION (Right)  01/30/2021   PORT-A-CATH REMOVAL  05/10/2014   PORT-A-CATH REMOVAL N/A 05/10/2014   Procedure: REMOVAL  PORT-A-CATH;  Surgeon: Rolm Bookbinder, MD;  Location: [facility];  Service: General;  Laterality: N/A;   PORTACATH PLACEMENT  07/21/2012   Procedure: INSERTION PORT-A-CATH;  Surgeon: Rolm Bookbinder, MD;  Location: [facility];  Service: General;  Laterality: Left;/ Replacement done 10'13   SIMPLE MASTECTOMY WITH AXILLARY SENTINEL NODE BIOPSY Left 05/10/2014   Procedure: PROPHYLACTIC LEFT MASTECTOMY;  Surgeon: Rolm Bookbinder, MD;  Location: [facility];  Service: General;  Laterality: Left;   TONSILLECTOMY     TUBAL LIGATION  ~ 1974   TUMOR EXCISION Right 1970   giant cell, off my forearm"   [place] N/A 01/30/2021   Procedure: VIDEO BRONCHOSCOPY WITH ENDOBRONCHIAL NAVIGATION FOR TUMOR MARKING;  Surgeon: [name], MD;  Location: [facility];  Service: Thoracic;  Laterality: N/A;   VIDEO BRONCHOSCOPY WITH  ENDOBRONCHIAL NAVIGATION FOR TUMOR MARKING (N/A)  01/30/2021   XI ROBOTIC ASSISTED THORACOSCOPY- SEGMENTECTOMY Right 01/30/2021   Procedure: XI ROBOTIC ASSISTED THORACOSCOPY-POSTERIOR SEGMENTECTOMY RIGHT UPPER LOBE;  Surgeon: [name], MD;  Location: [facility];  Service: Thoracic;  Laterality: Right;   XI ROBOTIC ASSISTED THORACOSCOPY-POSTERIOR SEGMENTECTOMY RIGHT UPPER LOBE (Right)  01/30/2021    REVIEW OF SYSTEMS:  A comprehensive review of systems was negative.   PHYSICAL EXAMINATION: General appearance: alert, cooperative, and no distress Head: Normocephalic, without obvious abnormality, atraumatic Neck: no adenopathy, no JVD, supple, symmetrical, trachea midline, and thyroid not enlarged, symmetric, no tenderness/mass/nodules Lymph nodes: Cervical, supraclavicular, and axillary nodes normal. Resp: clear to auscultation bilaterally Back: symmetric, no curvature. ROM normal. No CVA tenderness. Cardio: regular rate and rhythm, S1, S2 normal, no murmur, click, rub or gallop GI: soft, non-tender; bowel sounds normal; no masses,   no organomegaly Extremities: extremities normal, atraumatic, no cyanosis or edema  ECOG PERFORMANCE STATUS: 0 - Asymptomatic  Blood pressure (!) 171/77, pulse 64, temperature 97.9 F (36.6 C), temperature source Oral, resp. rate 18, height 5' 1"  (1.549 m), SpO2 100 %.  LABORATORY DATA: Lab Results  Component Value Date   WBC 4.8 09/22/2022   HGB 12.4 09/22/2022   HCT 36.6 09/22/2022   MCV 91.0 09/22/2022   PLT 154 09/22/2022      Chemistry      Component Value Date/Time   NA 138 09/22/2022 0806   NA 139 10/15/2017 0811   K 4.1 09/22/2022 0806   K 4.0 10/15/2017 0811   CL 105 09/22/2022 0806   CL 105 05/20/2013 1459   CO2 29 09/22/2022 0806   CO2 27 10/15/2017 0811   BUN 20 09/22/2022 0806   BUN 25.6 10/15/2017 0811   CREATININE 0.92 09/22/2022 0806   CREATININE 0.9 10/15/2017 0811      Component Value Date/Time   CALCIUM 9.4 09/22/2022 0806   CALCIUM 9.6 10/15/2017 0811   ALKPHOS 48 09/22/2022 0806   ALKPHOS 51 10/15/2017 0811   AST 16 09/22/2022 0806   AST 18 10/15/2017 0811   ALT 16 09/22/2022 0806   ALT 23 10/15/2017 0811   BILITOT 0.6 09/22/2022 0806   BILITOT 0.68 10/15/2017 0811       RADIOGRAPHIC STUDIES: CT Chest W Contrast  Result Date: 09/23/2022 CLINICAL DATA:  Non-small cell lung cancer, chemotherapy and radiation therapy complete. History of breast cancer. * Tracking Code: BO * EXAM: CT CHEST WITH CONTRAST TECHNIQUE: Multidetector CT imaging of the chest was performed during intravenous contrast administration. RADIATION DOSE REDUCTION: This exam was performed according to the departmental dose-optimization program which includes automated exposure control, adjustment of the mA and/or kV according to patient size and/or use of iterative reconstruction technique. CONTRAST:  61m OMNIPAQUE IOHEXOL 300 MG/ML  SOLN COMPARISON:  03/24/2022. FINDINGS: Cardiovascular: Atherosclerotic calcification of the aorta. Heart size normal. No pericardial effusion.  Mediastinum/Nodes: Mediastinal lymph nodes are not enlarged by CT size criteria. Minimal bihilar lymphoid tissue. No axillary adenopathy. Surgical clips in the right axilla. Esophagus is grossly unremarkable. Moderate hiatal hernia. Lungs/Pleura: Postoperative and post treatment scarring and volume loss in the right upper lobe. Subpleural radiation scarring in the anterior right upper lobe and right middle lobe. New 3 mm subpleural medial right lower lobe nodule (5/109). Additional scattered small pulmonary nodules measuring up to 5 mm in the left upper lobe, stable. No pleural fluid. Airway is otherwise unremarkable. Upper Abdomen: Visualized portions of the liver, gallbladder and adrenal glands are unremarkable.  Subcentimeter low-attenuation lesions in the kidneys are too small to characterize. No specific follow-up necessary. Visualized portions of the spleen, pancreas, stomach and bowel are unremarkable with the exception of a moderate hiatal hernia. No upper abdominal adenopathy. Musculoskeletal: Degenerative changes in the spine. No worrisome lytic or sclerotic lesions. IMPRESSION: 1. New 3 mm subpleural medial right lower lobe nodule is nonspecific. Recommend continued attention on follow-up. 2. Additional scattered small pulmonary nodules are unchanged. Recommend attention on follow-up. 3. Moderate hiatal hernia. 4.  Aortic atherosclerosis (ICD10-I70.0). Electronically Signed   By: Lorin Picket M.D.   On: 09/23/2022 13:50    ASSESSMENT AND PLAN: This is a very pleasant 76 years old white female diagnosed with stage IA (T1 a, N0, M0) non-small cell lung cancer, adenocarcinoma presented with right upper lobe lung nodule status post right upper lobe superior segmentectomy with lymph node dissection under the care of Dr. Roxan Hockey on January 30, 2021. The patient also has a history of inflammatory breast cancer involving the right breast with ER/PR negative and HER2 positive status post neoadjuvant  chemotherapy followed by right mastectomy and adjuvant chemotherapy under the care of Dr. Humphrey Rolls and Dr. Lindi Adie. The patient is currently on observation and she is feeling fine with no concerning complaints. She had repeat CT scan of the chest performed recently.  I personally and independently reviewed the scan and discussed the result with the patient today. Her scan showed no concerning findings for disease recurrence or metastasis but there was new 0.3 cm subpleural medial right lower lobe nodule that is nonspecific but require attention on follow-up imaging studies. I recommended for the patient to continue on observation with repeat CT scan of the chest in 6 months. For the hypertension she was advised to monitor her blood pressure medication closely at home and to discuss with her primary care physician for any adjustment of her medication. For the history of deep venous thrombosis and heterozygous prothrombin gene mutation, she will continue on Xarelto and this will be monitored by her primary care physician. The patient was advised to call immediately if she has any concerning symptoms in the interval. The patient voices understanding of current disease status and treatment options and is in agreement with the current care plan.  All questions were answered. The patient knows to call the clinic with any problems, questions or concerns. We can certainly see the patient much sooner if necessary.  The total time spent in the appointment was 20 minutes.  Disclaimer: This note was dictated with voice recognition software. Similar sounding words can inadvertently be transcribed and may not be corrected upon review.

## 2022-10-01 DIAGNOSIS — Z23 Encounter for immunization: Secondary | ICD-10-CM | POA: Diagnosis not present

## 2022-10-17 DIAGNOSIS — D6852 Prothrombin gene mutation: Secondary | ICD-10-CM | POA: Diagnosis not present

## 2022-10-17 DIAGNOSIS — Z7901 Long term (current) use of anticoagulants: Secondary | ICD-10-CM | POA: Diagnosis not present

## 2022-10-17 DIAGNOSIS — Z6824 Body mass index (BMI) 24.0-24.9, adult: Secondary | ICD-10-CM | POA: Diagnosis not present

## 2022-10-17 DIAGNOSIS — M8580 Other specified disorders of bone density and structure, unspecified site: Secondary | ICD-10-CM | POA: Diagnosis not present

## 2022-10-17 DIAGNOSIS — E7800 Pure hypercholesterolemia, unspecified: Secondary | ICD-10-CM | POA: Diagnosis not present

## 2022-10-17 DIAGNOSIS — E1169 Type 2 diabetes mellitus with other specified complication: Secondary | ICD-10-CM | POA: Diagnosis not present

## 2022-11-04 ENCOUNTER — Encounter: Payer: Self-pay | Admitting: Allergy & Immunology

## 2022-11-04 ENCOUNTER — Ambulatory Visit (INDEPENDENT_AMBULATORY_CARE_PROVIDER_SITE_OTHER): Payer: Medicare Other | Admitting: Allergy & Immunology

## 2022-11-04 VITALS — BP 110/60 | HR 73 | Temp 98.0°F | Resp 16 | Ht 61.0 in | Wt 136.9 lb

## 2022-11-04 DIAGNOSIS — J3089 Other allergic rhinitis: Secondary | ICD-10-CM

## 2022-11-04 DIAGNOSIS — J4540 Moderate persistent asthma, uncomplicated: Secondary | ICD-10-CM | POA: Diagnosis not present

## 2022-11-04 DIAGNOSIS — T7800XD Anaphylactic reaction due to unspecified food, subsequent encounter: Secondary | ICD-10-CM

## 2022-11-04 DIAGNOSIS — J302 Other seasonal allergic rhinitis: Secondary | ICD-10-CM

## 2022-11-04 NOTE — Patient Instructions (Addendum)
1. Mild persistent asthma, uncomplicated - Lung testing looks great today.  - I think your breathing is under good control.  - We will change you from Symbicort to [facility] (it says that it is preferred, so we will see). - Daily controller medication(s): NOTHING - Prior to physical activity: ProAir 2 puffs 10-15 minutes before physical activity. - Rescue medications: ProAir 4 puffs every 4-6 hours as needed - During respiratory flares: START Breo 270mcg one puff once daily for 1-2 weeks - Asthma control goals:  * Full participation in all desired activities (may need albuterol before activity) * Albuterol use two time or less a week on average (not counting use with activity) * Cough interfering with sleep two time or less a month * Oral steroids no more than once a year * No hospitalizations  2. Chronic allergic rhinitis (grasses, weeds, molds, dust mite, cat, dog, feathers, horse, cockroach) - Continue with Flonase 1 spray per nostril daily. - Continue with antihistamines daily as needed.  3. Intense pruritis - with alpha gal sensitivity as well as wheat, milk, tree nuts, egg, chickpea, sunflower, barley - Continue to avoid red meat as you are doing.  - Continue with avoidance of your other triggering foods including milk and chickpea. - We could consider adding on the Xolair monthly when it gets to the point where you are having to avoid these reactions (it might allow you to tolerate the lower IgE foods).   4. Return in about 6 months (around 05/06/2023).  Please inform us of any Emergency Department visits, hospitalizations, or changes in symptoms. Call us before going to the ED for breathing or allergy symptoms since we might be able to fit you in for a sick visit. Feel free to contact us anytime with any questions, problems, or concerns.  It was a pleasure to see you again today!  Websites that have reliable patient information: 1. American Academy of Asthma, Allergy, and Immunology:  www.aaaai.org 2. Food Allergy Research and Education (FARE): foodallergy.org 3. Mothers of Asthmatics: http://www.asthmacommunitynetwork.org 4. American College of Allergy, Asthma, and Immunology: www.acaai.org   COVID-19 Vaccine Information can be found at: ShippingScam.co.uk For questions related to vaccine distribution or appointments, please email [email or web address] or call [phone].   We realize that you might be concerned about having an allergic reaction to the COVID19 vaccines. To help with that concern, WE ARE OFFERING THE COVID19 VACCINES IN OUR OFFICE! Ask the front desk for dates!     "Like" [place] on Facebook and Instagram for our latest updates!       Make sure you are registered to vote! If you have moved or changed any of your contact information, you will need to get this updated before voting!  In some cases, you MAY be able to register to vote online: [email or web address]

## 2022-11-04 NOTE — Progress Notes (Unsigned)
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 Mild persistent asthma, uncomplicated - Lung testing looks great today.  - I think your breathing is under good control.  - We will change you from Symbicort to [facility] (it says that it is preferred, so we will see). - Daily controller medication(s): NOTHING - Prior to physical activity: ProAir 2 puffs 10-15 minutes before physical activity. - Rescue medications: ProAir 4 puffs every 4-6 hours as needed - During respiratory flares: START Breo 244mcg one puff once daily for 1-2 weeks - Asthma control goals:  * Full participation in all desired activities (may need albuterol before activity) * Albuterol use two time or less a week on average (not counting use with activity) * Cough interfering with sleep two time or less a month * Oral steroids no more than once a year * No hospitalizations  2. Chronic allergic rhinitis (grasses, weeds, molds, dust mite, cat, dog, feathers, horse, cockroach) - Continue with Flonase 1 spray per nostril daily. - Continue with antihistamines daily as needed.  3. Intense pruritis - with alpha gal sensitivity as well as wheat, milk, tree nuts, egg, chickpea, sunflower, barley - Continue to avoid red meat as you are doing.  - Continue with avoidance of your other triggering foods including milk and chickpea. - We could consider adding on the Xolair monthly when it gets to the point where you are having to avoid these reactions (it might allow you to tolerate the  lower IgE foods).   4. Return in about 6 months (around 05/06/2023).   Subjective:   Jessica Wells is a 76 y.o. female presenting today for follow up of  Chief Complaint  Patient presents with   Follow-up    Some allergies    Jessica Wells has a history of the following: Patient Active Problem List   Diagnosis Date Noted   Genetic testing 10/08/2021   Family history of breast cancer 04/04/2021   Family history of lung cancer 04/04/2021   Adenocarcinoma of lung, stage 1, right ([place]) 02/02/2021   Seasonal and perennial allergic rhinitis 09/17/2017   S/P mastectomy 05/10/2014   Cancer of central portion of right female breast ([place]) 09/23/2013   Pulmonary nodule 01/03/2013   Acute respiratory failure with hypoxia ([place]) 12/29/2012   Hypokalemia 12/29/2012   Pulmonary embolism ([place]) 12/28/2012   Healthcare-associated pneumonia 12/27/2012   Acute respiratory distress 12/27/2012   Asthma with COPD    GERD (gastroesophageal reflux disease)    Dysrhythmia    Allergy    Arthritis    Clotting disorder ([place])    History of chemotherapy    DVT (deep venous thrombosis) ([place])    DVT of lower extremity, bilateral ([place]) 12/22/2012   PSVT (paroxysmal supraventricular tachycardia) 10/14/2012   PONV (postoperative nausea and vomiting) 09/13/2012   PAT (paroxysmal atrial tachycardia)     History obtained from: chart review and patient.  Jessica Wells is a 76 y.o. female presenting for a follow up visit. She was last seen in June 2023. At that  time, we did not do lung testing. We changed her from Symbicort to [facility] since this seemed to be covered better than the Symbicort. We continued with albuterol as needed and Breo added during flares. For her rhinitis, we continued with fluticasone as well as an antihistamine as needed. She was experiencing pruritus and we recommended continued avoidance of red meat as well as her other triggering foods. We obtained a chickpea IgE level as well as a sunflower IgE  level; both were elevated, although the sunflower was much higher than the chickpea. We encouraged avoidance of these items.   Since the last visit, she has done well.  Asthma/Respiratory Symptom History: She is not using anything routinely. She had a CT for lung cancer screening and this was normal. She had he [employer] which is affordable; she has only used it a couple of times tops. She used her Ventolin once. She is worried about what would happen with influenza or a cold.   Allergic Rhinitis Symptom History: Rhinitis symptoms are well controlled with the use of fluticasone daily. She does not use this every single day, but rather focuses on those days which are more bothersome to her.   Food Allergy Symptom History: She does still have some breakouts. She is doing good with reading labels. She has some breakouts and everything is caught relatively early. She tends to eliminate the high level foods and just see how much she can tolerate the small amounts. She has been told that she should not eat barley.  Her food allergies have generally gotten worse over the years. She is on veggies and chicken and fish. She does rice and corn without a problem. Sunflower and sesame are the worst ones.   She is now taking all of her vaccines. She took her Prevnar in January 2023. She is looking into the RSV vaccine and wants to know what I think about it.   Otherwise, there have been no changes to her past medical history, surgical history, family history, or social history.    Review of Systems  Constitutional: Negative.  Negative for chills, fever, malaise/fatigue and weight loss.  HENT: Negative.  Negative for congestion, ear discharge, ear pain, sinus pain and sore throat.   Eyes:  Negative for pain, discharge and redness.  Respiratory:  Negative for cough, sputum production, shortness of breath and wheezing.   Cardiovascular: Negative.  Negative for chest pain and palpitations.  Gastrointestinal:   Negative for abdominal pain, constipation, diarrhea, heartburn, nausea and vomiting.  Skin: Negative.  Negative for itching and rash.  Neurological:  Negative for dizziness and headaches.  Endo/Heme/Allergies:  Negative for environmental allergies. Does not bruise/bleed easily.       Objective:   Blood pressure 110/60, pulse 73, temperature 98 F (36.7 C), temperature source Temporal, resp. rate 16, height 5\' 1"  (1.549 m), weight 136 lb 14.4 oz (62.1 kg), SpO2 95 %. Body mass index is 25.87 kg/m.    Physical Exam Vitals reviewed.  Constitutional:      Appearance: She is well-developed.     Comments: Bubbly female. Very lovely.   HENT:     Head: Normocephalic and atraumatic.     Right Ear: Tympanic membrane, ear canal and external ear normal.     Left Ear: Tympanic membrane, ear canal and external ear normal.     Nose: No nasal deformity, septal deviation, mucosal edema or rhinorrhea.     Right Turbinates: Enlarged. Not swollen or pale.     Left  Turbinates: Enlarged. Not swollen or pale.     Right Sinus: No maxillary sinus tenderness or frontal sinus tenderness.     Left Sinus: No maxillary sinus tenderness or frontal sinus tenderness.     Mouth/Throat:     Mouth: Mucous membranes are not pale and not dry.     Pharynx: Uvula midline.  Eyes:     General:        Right eye: No discharge.        Left eye: No discharge.     Conjunctiva/sclera: Conjunctivae normal.     Right eye: Right conjunctiva is not injected. No chemosis.    Left eye: Left conjunctiva is not injected. No chemosis.    Pupils: Pupils are equal, round, and reactive to light.  Cardiovascular:     Rate and Rhythm: Normal rate and regular rhythm.     Heart sounds: Normal heart sounds.  Pulmonary:     Effort: Pulmonary effort is normal. No tachypnea, accessory muscle usage or respiratory distress.     Breath sounds: Normal breath sounds. No wheezing, rhonchi or rales.     Comments: Moving air well in all lung  fields. Chest:     Chest wall: No tenderness.  Lymphadenopathy:     Cervical: No cervical adenopathy.  Skin:    General: Skin is warm.     Capillary Refill: Capillary refill takes less than 2 seconds.     Coloration: Skin is not pale.     Findings: No abrasion, erythema, petechiae or rash. Rash is not papular, urticarial or vesicular.     Comments: No eczematous or urticarial lesions noted.   Neurological:     Mental Status: She is alert.  Psychiatric:        Behavior: Behavior is cooperative.      Diagnostic studies:    Spirometry: results normal (FEV1: 1.40/78%, FVC: 1.75/75%, FEV1/FVC: 80%).    Spirometry consistent with normal pattern. Xopenex four puffs via MDI treatment given in clinic with no improvement.  Allergy Studies:  none        Salvatore Marvel, MD  [facility] [place] [facility]

## 2022-11-06 ENCOUNTER — Encounter: Payer: Self-pay | Admitting: Allergy & Immunology

## 2022-11-13 DIAGNOSIS — E7800 Pure hypercholesterolemia, unspecified: Secondary | ICD-10-CM | POA: Diagnosis not present

## 2022-11-13 DIAGNOSIS — E1169 Type 2 diabetes mellitus with other specified complication: Secondary | ICD-10-CM | POA: Diagnosis not present

## 2022-11-13 DIAGNOSIS — M8580 Other specified disorders of bone density and structure, unspecified site: Secondary | ICD-10-CM | POA: Diagnosis not present

## 2022-12-30 DIAGNOSIS — H2513 Age-related nuclear cataract, bilateral: Secondary | ICD-10-CM | POA: Diagnosis not present

## 2023-02-27 ENCOUNTER — Other Ambulatory Visit: Payer: Self-pay | Admitting: Physician Assistant

## 2023-02-27 DIAGNOSIS — C50111 Malignant neoplasm of central portion of right female breast: Secondary | ICD-10-CM

## 2023-02-27 MED ORDER — RIVAROXABAN 20 MG PO TABS: 20.0000 mg | ORAL_TABLET | Freq: Every day | ORAL | 0 refills | Status: AC

## 2023-02-27 NOTE — Telephone Encounter (Signed)
Already sent.

## 2023-03-23 ENCOUNTER — Inpatient Hospital Stay: Payer: Medicare Other | Attending: Internal Medicine

## 2023-03-23 ENCOUNTER — Other Ambulatory Visit: Payer: Self-pay

## 2023-03-23 ENCOUNTER — Other Ambulatory Visit: Payer: Self-pay | Admitting: Internal Medicine

## 2023-03-23 ENCOUNTER — Ambulatory Visit (HOSPITAL_COMMUNITY)
Admission: RE | Admit: 2023-03-23 | Discharge: 2023-03-23 | Disposition: A | Discharge status: Home / Self Care | Payer: Medicare Other | Source: Ambulatory Visit | Attending: Internal Medicine | Admitting: Internal Medicine

## 2023-03-23 DIAGNOSIS — Z9221 Personal history of antineoplastic chemotherapy: Secondary | ICD-10-CM | POA: Insufficient documentation

## 2023-03-23 DIAGNOSIS — Z853 Personal history of malignant neoplasm of breast: Secondary | ICD-10-CM | POA: Insufficient documentation

## 2023-03-23 DIAGNOSIS — C3411 Malignant neoplasm of upper lobe, right bronchus or lung: Secondary | ICD-10-CM | POA: Diagnosis not present

## 2023-03-23 DIAGNOSIS — C3490 Malignant neoplasm of unspecified part of unspecified bronchus or lung: Secondary | ICD-10-CM | POA: Diagnosis not present

## 2023-03-23 DIAGNOSIS — Z85118 Personal history of other malignant neoplasm of bronchus and lung: Secondary | ICD-10-CM | POA: Insufficient documentation

## 2023-03-23 DIAGNOSIS — Z9011 Acquired absence of right breast and nipple: Secondary | ICD-10-CM | POA: Insufficient documentation

## 2023-03-23 LAB — CBC WITH DIFFERENTIAL (CANCER CENTER ONLY)
Abs Immature Granulocytes: 0.01 10*3/uL (ref 0.00–0.07)
Basophils Absolute: 0.1 10*3/uL (ref 0.0–0.1)
Basophils Relative: 1 %
Eosinophils Absolute: 0.3 10*3/uL (ref 0.0–0.5)
Eosinophils Relative: 5 %
HCT: 37.9 % (ref 36.0–46.0)
Hemoglobin: 13.3 g/dL (ref 12.0–15.0)
Immature Granulocytes: 0 %
Lymphocytes Relative: 32 %
Lymphs Abs: 1.7 10*3/uL (ref 0.7–4.0)
MCH: 32.2 pg (ref 26.0–34.0)
MCHC: 35.1 g/dL (ref 30.0–36.0)
MCV: 91.8 fL (ref 80.0–100.0)
Monocytes Absolute: 0.5 10*3/uL (ref 0.1–1.0)
Monocytes Relative: 9 %
Neutro Abs: 2.9 10*3/uL (ref 1.7–7.7)
Neutrophils Relative %: 53 %
Platelet Count: 153 10*3/uL (ref 150–400)
RBC: 4.13 MIL/uL (ref 3.87–5.11)
RDW: 12 % (ref 11.5–15.5)
WBC Count: 5.5 10*3/uL (ref 4.0–10.5)
nRBC: 0 % (ref 0.0–0.2)

## 2023-03-23 LAB — CMP (CANCER CENTER ONLY)
ALT: 15 U/L (ref 0–44)
AST: 17 U/L (ref 15–41)
Albumin: 4.4 g/dL (ref 3.5–5.0)
Alkaline Phosphatase: 49 U/L (ref 38–126)
Anion gap: 7 (ref 5–15)
BUN: 24 mg/dL — ABNORMAL HIGH (ref 8–23)
CO2: 28 mmol/L (ref 22–32)
Calcium: 9.6 mg/dL (ref 8.9–10.3)
Chloride: 104 mmol/L (ref 98–111)
Creatinine: 1.05 mg/dL — ABNORMAL HIGH (ref 0.44–1.00)
GFR, Estimated: 54 mL/min — ABNORMAL LOW (ref >60)
Glucose, Bld: 118 mg/dL — ABNORMAL HIGH (ref 70–99)
Potassium: 4.6 mmol/L (ref 3.5–5.1)
Sodium: 139 mmol/L (ref 135–145)
Total Bilirubin: 0.5 mg/dL (ref 0.3–1.2)
Total Protein: 7.1 g/dL (ref 6.5–8.1)

## 2023-03-23 MED ORDER — SODIUM CHLORIDE (PF) 0.9 % IJ SOLN
INTRAMUSCULAR | Status: AC
  Filled 2023-03-23: qty 50

## 2023-03-23 MED ORDER — IOHEXOL 300 MG/ML  SOLN
75.0000 mL | Freq: Once | INTRAMUSCULAR | Status: AC | PRN
  Administered 2023-03-23: 75 mL via INTRAVENOUS

## 2023-03-26 ENCOUNTER — Inpatient Hospital Stay (HOSPITAL_BASED_OUTPATIENT_CLINIC_OR_DEPARTMENT_OTHER): Payer: Medicare Other | Admitting: Internal Medicine

## 2023-03-26 VITALS — BP 155/81 | HR 60 | Temp 97.8°F | Resp 18 | Ht 61.0 in | Wt 131.2 lb

## 2023-03-26 DIAGNOSIS — C3490 Malignant neoplasm of unspecified part of unspecified bronchus or lung: Secondary | ICD-10-CM

## 2023-03-26 DIAGNOSIS — Z85118 Personal history of other malignant neoplasm of bronchus and lung: Secondary | ICD-10-CM | POA: Diagnosis not present

## 2023-03-26 DIAGNOSIS — Z9221 Personal history of antineoplastic chemotherapy: Secondary | ICD-10-CM | POA: Diagnosis not present

## 2023-03-26 DIAGNOSIS — Z853 Personal history of malignant neoplasm of breast: Secondary | ICD-10-CM | POA: Diagnosis not present

## 2023-03-26 DIAGNOSIS — Z9011 Acquired absence of right breast and nipple: Secondary | ICD-10-CM | POA: Diagnosis not present

## 2023-03-26 NOTE — Progress Notes (Signed)
[facility] [phone]   [phone]  OFFICE PROGRESS NOTE  [name], DO [address] [place] [address]  DIAGNOSIS:  1) Stage IA (T1 a, N0, M0) non-small cell lung cancer, adenocarcinoma presented with right upper lobe nodule  2) A history of inflammatory breast cancer involving the right breast, HR negative and HER2 positive status post neoadjuvant chemotherapy followed by right mastectomy followed by adjuvant therapy and she was followed by Dr. Welton Flakes and Dr. Pamelia Hoit.Marland Kitchen  PRIOR THERAPY: status post right upper lobe superior segmentectomy with lymph node dissection under the care of Dr. Dorris Fetch on January 30, 2021.  CURRENT THERAPY: Observation.   INTERVAL HISTORY: Jessica Wells 77 y.o. female returns to the clinic today for follow-up visit.  The patient is feeling fine today with no concerning complaints.  She denied having any current chest pain, shortness of breath, cough or hemoptysis.  She has no nausea, vomiting, diarrhea or constipation.  She has no headache or visual changes.  She has no recent weight loss or night sweats.  The patient is here today for evaluation with repeat CT scan of the chest for restaging of her disease.  MEDICAL HISTORY: Past Medical History:  Diagnosis Date   Allergic reaction to alpha-gal    Allergy    Anemia    "after chemo"   Arthritis    "knees; fingers; occasionally" (05/10/2014)   Asthma    Uses Inhalers Proventil as needed   Breast cancer (HCC) 07/14/12   inflammatory right breast ca, ER/PR -   Bursitis    "both shoulders"   Clotting disorder (HCC)    prothrombin gene mutation-heterozygous    DVT (deep venous thrombosis) (HCC) 12/22/2012   BLE   Dysrhythmia    PAT-sees dr Laurence Compton meds   Family history of breast cancer 04/04/2021   Family history of lung cancer 04/04/2021   GERD (gastroesophageal reflux disease)    H/O hiatal hernia    History of blood transfusion 01-25-13   2 units  01-11-13 ("after chem")   History of chemotherapy    Last dose to be 02-04-13.  Was rx'd with Herceptin & Gemzar   Lymphedema    RUE   Osteopenia    "lower spine only" (05/10/2014)   PAT (paroxysmal atrial tachycardia)    hx   PE (pulmonary thromboembolism) (HCC) 12/27/2012   Pneumonia 01-25-13   hx. 12-27-12-hospital stay x 9 days, now resolved.   Pneumonia    "episodic; all my life" (05/10/2014)   PONV (postoperative nausea and vomiting) 09-13-12   severe, with Port-a-cath, was managed without PONV   S/P radiation therapy 4-12 wks ago 03/31/13-05/17/13   right breast/supraclavicular fossa/posterior axillary boost    ALLERGIES:  is allergic to alpha-gal; anesthetics, amide; sulfa antibiotics; sulfamethoxazole-trimethoprim; anesthesia s-i-40 [propofol]; codeine; codeine phosphate; egg-derived products; fish allergy; milk-related compounds; and other.  MEDICATIONS:  Current Outpatient Medications  Medication Sig Dispense Refill   albuterol (VENTOLIN HFA) 108 (90 Base) MCG/ACT inhaler INHALE 4 PUFFS BY MOUTH EVERY 4 TO 6 HOURS AS NEEDED FOR COUGH AND FOR WHEEZING .USE  WITH  SPACER. (Patient not taking: Reported on 11/04/2022) 18 g 1   atorvastatin (LIPITOR) 10 MG tablet Take 10 mg by mouth daily.     B Complex-C (SUPER B COMPLEX PO) Take 1 tablet by mouth at bedtime.      calcium-vitamin D (OSCAL WITH D) 500-200 MG-UNIT per tablet Take 1 tablet by mouth every other day.  COVID-19 mRNA bivalent vaccine, Pfizer, (PFIZER COVID-19 VAC BIVALENT) injection Inject into the muscle. 0.3 mL 0   COVID-19 mRNA Vac-TriS, Pfizer, (PFIZER-BIONT COVID-19 VAC-TRIS) SUSP injection Inject into the muscle. 0.3 mL 0   EPINEPHrine 0.3 mg/0.3 mL IJ SOAJ injection Use as directed for severe allergic reaction 2 each 1   famotidine (PEPCID) 20 MG tablet Take 20 mg by mouth 2 (two) times daily. 20 am and 10 pm     fluticasone (FLONASE) 50 MCG/ACT nasal spray Place 1 spray into both nostrils as needed for allergies or  rhinitis. 16 g 5   fluticasone furoate-vilanterol (BREO ELLIPTA) 100-25 MCG/ACT AEPB Inhale 1 puff into the lungs daily. (Patient not taking: Reported on 11/04/2022) 60 each 1   glucosamine-chondroitin 500-400 MG tablet Take 1 tablet by mouth at bedtime.      lisinopril (PRINIVIL,ZESTRIL) 2.5 MG tablet Take 2.5 mg by mouth every evening.      Magnesium 250 MG TABS Take 250 mg by mouth at bedtime as needed (leg cramps).     metoprolol succinate (TOPROL-XL) 50 MG 24 hr tablet Take 1 tablet by mouth once daily 90 tablet 3   Multiple Vitamin (MULTIVITAMIN WITH MINERALS) TABS Take 1 tablet by mouth every other day.      omeprazole (PRILOSEC) 20 MG capsule Take 1 capsule (20 mg total) by mouth daily. For heartburn 30 capsule 5   rivaroxaban (XARELTO) 20 MG TABS tablet Take 1 tablet (20 mg total) by mouth daily with supper. 90 tablet 0   No current facility-administered medications for this visit.    SURGICAL HISTORY:  Past Surgical History:  Procedure Laterality Date   BREAST BIOPSY Right 2013 X 3   CHALAZION EXCISION Right 1980's   right( MD office)   COLONOSCOPY     DILATION AND CURETTAGE OF UTERUS  1998 X 3   INSERTION OF VENA CAVA FILTER  12/2012   INTERCOSTAL NERVE BLOCK Right 01/30/2021   Procedure: INTERCOSTAL NERVE BLOCK;  Surgeon: Loreli Slot, MD;  Location: [facility] OR;  Service: Thoracic;  Laterality: Right;   MASTECTOMY Left 05/10/2014   PROPHYLACTIC    MASTECTOMY MODIFIED RADICAL Right 02/08/2013   Procedure: MASTECTOMY MODIFIED RADICAL;  Surgeon: Emelia Loron, MD;  Location: [facility] ORS;  Service: General;  Laterality: Right;   MASTECTOMY MODIFIED RADICAL Right    NODE DISSECTION Right 01/30/2021   Procedure: NODE DISSECTION;  Surgeon: Loreli Slot, MD;  Location: [facility] OR;  Service: Thoracic;  Laterality: Right;   NODE DISSECTION (Right)  01/30/2021   PORT-A-CATH REMOVAL  05/10/2014   PORT-A-CATH REMOVAL N/A 05/10/2014   Procedure: REMOVAL PORT-A-CATH;  Surgeon: Emelia Loron, MD;  Location: [facility] OR;  Service: General;  Laterality: N/A;   PORTACATH PLACEMENT  07/21/2012   Procedure: INSERTION PORT-A-CATH;  Surgeon: Emelia Loron, MD;  Location: [facility];  Service: General;  Laterality: Left;/ Replacement done 10'13   SIMPLE MASTECTOMY WITH AXILLARY SENTINEL NODE BIOPSY Left 05/10/2014   Procedure: PROPHYLACTIC LEFT MASTECTOMY;  Surgeon: Emelia Loron, MD;  Location: [facility] OR;  Service: General;  Laterality: Left;   TONSILLECTOMY     TUBAL LIGATION  ~ 1974   TUMOR EXCISION Right 1970   giant cell, off my forearm"   VIDEO BRONCHOSCOPY WITH ENDOBRONCHIAL NAVIGATION N/A 01/30/2021   Procedure: VIDEO BRONCHOSCOPY WITH ENDOBRONCHIAL NAVIGATION FOR TUMOR MARKING;  Surgeon: Loreli Slot, MD;  Location: [facility] OR;  Service: Thoracic;  Laterality: N/A;   VIDEO BRONCHOSCOPY WITH ENDOBRONCHIAL NAVIGATION FOR TUMOR MARKING (  N/A)  01/30/2021   XI ROBOTIC ASSISTED THORACOSCOPY- SEGMENTECTOMY Right 01/30/2021   Procedure: XI ROBOTIC ASSISTED THORACOSCOPY-POSTERIOR SEGMENTECTOMY RIGHT UPPER LOBE;  Surgeon: Loreli Slot, MD;  Location: [facility] OR;  Service: Thoracic;  Laterality: Right;   XI ROBOTIC ASSISTED THORACOSCOPY-POSTERIOR SEGMENTECTOMY RIGHT UPPER LOBE (Right)  01/30/2021    REVIEW OF SYSTEMS:  A comprehensive review of systems was negative.   PHYSICAL EXAMINATION: General appearance: alert, cooperative, and no distress Head: Normocephalic, without obvious abnormality, atraumatic Neck: no adenopathy, no JVD, supple, symmetrical, trachea midline, and thyroid not enlarged, symmetric, no tenderness/mass/nodules Lymph nodes: Cervical, supraclavicular, and axillary nodes normal. Resp: clear to auscultation bilaterally Back: symmetric, no curvature. ROM normal. No CVA tenderness. Cardio: regular rate and rhythm, S1, S2 normal, no murmur, click, rub or gallop GI: soft, non-tender; bowel sounds normal; no masses,  no organomegaly Extremities:  extremities normal, atraumatic, no cyanosis or edema  ECOG PERFORMANCE STATUS: 0 - Asymptomatic  Blood pressure (!) 155/81, pulse 60, temperature 97.8 F (36.6 C), temperature source Temporal, resp. rate 18, height  (1.549 m), weight 131 lb 3.2 oz (59.5 kg), SpO2 99 %.  LABORATORY DATA: Lab Results  Component Value Date   WBC 5.5 03/23/2023   HGB 13.3 03/23/2023   HCT 37.9 03/23/2023   MCV 91.8 03/23/2023   PLT 153 03/23/2023      Chemistry      Component Value Date/Time   NA 139 03/23/2023 0757   NA 139 10/15/2017 0811   K 4.6 03/23/2023 0757   K 4.0 10/15/2017 0811   CL 104 03/23/2023 0757   CL 105 05/20/2013 1459   CO2 28 03/23/2023 0757   CO2 27 10/15/2017 0811   BUN 24 (H) 03/23/2023 0757   BUN 25.6 10/15/2017 0811   CREATININE 1.05 (H) 03/23/2023 0757   CREATININE 0.9 10/15/2017 0811      Component Value Date/Time   CALCIUM 9.6 03/23/2023 0757   CALCIUM 9.6 10/15/2017 0811   ALKPHOS 49 03/23/2023 0757   ALKPHOS 51 10/15/2017 0811   AST 17 03/23/2023 0757   AST 18 10/15/2017 0811   ALT 15 03/23/2023 0757   ALT 23 10/15/2017 0811   BILITOT 0.5 03/23/2023 0757   BILITOT 0.68 10/15/2017 0811       RADIOGRAPHIC STUDIES: CT CHEST WO CONTRAST  Result Date: 03/25/2023 CLINICAL DATA:  Right upper lobe lung cancer, history of right breast cancer EXAM: CT CHEST WITHOUT CONTRAST TECHNIQUE: Multidetector CT imaging of the chest was performed following the standard protocol without IV contrast. RADIATION DOSE REDUCTION: This exam was performed according to the departmental dose-optimization program which includes automated exposure control, adjustment of the mA and/or kV according to patient size and/or use of iterative reconstruction technique. COMPARISON:  09/23/2022 FINDINGS: Cardiovascular: Heart is normal in size.  No pericardial effusion. No evidence of thoracic aortic aneurysm. Atherosclerotic calcifications of the aortic arch. Mild coronary atherosclerosis of  the LAD. Mediastinum/Nodes: Small mediastinal lymph nodes which do not meet pathologic CT size criteria, including a 7 mm short axis right paratracheal node (series 2/image 53). Visualized thyroid is unremarkable. Lungs/Pleura: Status post right upper lobe wedge resection with associated scarring and volume loss. Prior medial right lower lobe nodule is no longer visualized, presumably infectious/inflammatory. Additional stable 3-5 mm nodules in the left lung (series 7/images 30, 51, 79, 96, and 114), favored to be benign. No new/suspicious pulmonary nodules. No focal consolidation. No pleural effusion or pneumothorax. Upper Abdomen: Visualized upper abdomen is grossly unremarkable. Musculoskeletal: Mild  degenerative changes of the visualized thoracolumbar spine. IMPRESSION: Status post right upper lobe wedge resection with associated scarring and volume loss. Prior medial right lower lobe nodule is no longer visualized, presumably infectious/inflammatory. Additional stable 3-5 mm nodules in the left lung, favored to be benign. No findings suspicious for recurrent or metastatic disease. Aortic Atherosclerosis (ICD10-I70.0). Electronically Signed   By: Charline Bills M.D.   On: 03/25/2023 04:11    ASSESSMENT AND PLAN: This is a very pleasant 77 years old white female diagnosed with stage IA (T1 a, N0, M0) non-small cell lung cancer, adenocarcinoma presented with right upper lobe lung nodule status post right upper lobe superior segmentectomy with lymph node dissection under the care of Dr. Dorris Fetch on January 30, 2021. The patient also has a history of inflammatory breast cancer involving the right breast with ER/PR negative and HER2 positive status post neoadjuvant chemotherapy followed by right mastectomy and adjuvant chemotherapy under the care of Dr. Welton Flakes and Dr. Pamelia Hoit. The patient is currently on observation. She had repeat CT scan of the chest performed recently.  I personally reviewed her scan and  discussed the result with the patient today. Her scan showed no concerning findings for disease recurrence or metastasis. I recommended for her to continue on observation with repeat CT scan of the chest in 1 year. The patient was advised to call immediately if she has any other concerning symptoms in the interval. The patient voices understanding of current disease status and treatment options and is in agreement with the current care plan.  All questions were answered. The patient knows to call the clinic with any problems, questions or concerns. We can certainly see the patient much sooner if necessary.  The total time spent in the appointment was 20 minutes.  Disclaimer: This note was dictated with voice recognition software. Similar sounding words can inadvertently be transcribed and may not be corrected upon review.

## 2023-04-08 ENCOUNTER — Other Ambulatory Visit: Payer: Self-pay | Admitting: Cardiology

## 2023-04-16 ENCOUNTER — Encounter: Payer: Self-pay | Admitting: Cardiology

## 2023-04-16 ENCOUNTER — Ambulatory Visit: Payer: Medicare Other | Attending: Cardiology | Admitting: Cardiology

## 2023-04-16 VITALS — BP 130/80 | HR 59 | Ht 61.0 in | Wt 129.0 lb

## 2023-04-16 DIAGNOSIS — I4719 Other supraventricular tachycardia: Secondary | ICD-10-CM | POA: Diagnosis not present

## 2023-04-16 DIAGNOSIS — I471 Supraventricular tachycardia, unspecified: Secondary | ICD-10-CM | POA: Diagnosis not present

## 2023-04-16 MED ORDER — METOPROLOL SUCCINATE ER 50 MG PO TB24: 50.0000 mg | ORAL_TABLET | Freq: Every day | ORAL | 3 refills | Status: DC

## 2023-04-16 MED ORDER — ATORVASTATIN CALCIUM 20 MG PO TABS: 20.0000 mg | ORAL_TABLET | Freq: Every day | ORAL | 3 refills | Status: DC

## 2023-04-16 NOTE — Progress Notes (Signed)
Cardiology Office Note:    Date:  04/16/2023   ID:  Jessica Wells, DOB 02/28/1946, MRN [id number]  PCP:  [name], DO   [facility] Providers Cardiologist:  Donato Schultz, MD     Referring MD: [name], DO    History of Present Illness:    Jessica Wells is a 77 y.o. female here for follow-up, PSVT, paroxysmal atrial tachycardia with prior DVT right-sided inflammatory breast cancer chronic anticoagulation previously seen by Dr. Gala Romney on Herceptin previously with right arm lymphedema Ace wraps, stage Ia non-small cell lung cancer adenocarcinoma right upper lobe nodule followed by Dr. Arbutus Ped.  CT looks normal post lung surgery.  She does however have LAD calcification present.  Family CAD history.   Toprol helps. No fullness. Walks her [address] [place].  Denies any chest pain shortness of breath.  Overall doing quite well.  Past Medical History:  Diagnosis Date   Allergic reaction to alpha-gal    Allergy    Anemia    "after chemo"   Arthritis    "knees; fingers; occasionally" (05/10/2014)   Asthma    Uses Inhalers Proventil as needed   Breast cancer (HCC) 07/14/12   inflammatory right breast ca, ER/PR -   Bursitis    "both shoulders"   Clotting disorder (HCC)    prothrombin gene mutation-heterozygous    DVT (deep venous thrombosis) (HCC) 12/22/2012   BLE   Dysrhythmia    PAT-sees dr Laurence Compton meds   Family history of breast cancer 04/04/2021   Family history of lung cancer 04/04/2021   GERD (gastroesophageal reflux disease)    H/O hiatal hernia    History of blood transfusion 01-25-13   2 units 01-11-13 ("after chem")   History of chemotherapy    Last dose to be 02-04-13.  Was rx'd with Herceptin & Gemzar   Lymphedema    RUE   Osteopenia    "lower spine only" (05/10/2014)   PAT (paroxysmal atrial tachycardia)    hx   PE (pulmonary thromboembolism) (HCC) 12/27/2012   Pneumonia 01-25-13   hx. 12-27-12-hospital stay x 9 days, now resolved.   Pneumonia    "episodic; all  my life" (05/10/2014)   PONV (postoperative nausea and vomiting) 09-13-12   severe, with Port-a-cath, was managed without PONV   S/P radiation therapy 4-12 wks ago 03/31/13-05/17/13   right breast/supraclavicular fossa/posterior axillary boost    Past Surgical History:  Procedure Laterality Date   BREAST BIOPSY Right 2013 X 3   CHALAZION EXCISION Right 1980's   right( MD office)   COLONOSCOPY     DILATION AND CURETTAGE OF UTERUS  1998 X 3   INSERTION OF VENA CAVA FILTER  12/2012   INTERCOSTAL NERVE BLOCK Right 01/30/2021   Procedure: INTERCOSTAL NERVE BLOCK;  Surgeon: Loreli Slot, MD;  Location: [facility] OR;  Service: Thoracic;  Laterality: Right;   MASTECTOMY Left 05/10/2014   PROPHYLACTIC    MASTECTOMY MODIFIED RADICAL Right 02/08/2013   Procedure: MASTECTOMY MODIFIED RADICAL;  Surgeon: Emelia Loron, MD;  Location: [facility] ORS;  Service: General;  Laterality: Right;   MASTECTOMY MODIFIED RADICAL Right    NODE DISSECTION Right 01/30/2021   Procedure: NODE DISSECTION;  Surgeon: Loreli Slot, MD;  Location: [facility] OR;  Service: Thoracic;  Laterality: Right;   NODE DISSECTION (Right)  01/30/2021   PORT-A-CATH REMOVAL  05/10/2014   PORT-A-CATH REMOVAL N/A 05/10/2014   Procedure: REMOVAL PORT-A-CATH;  Surgeon: Emelia Loron, MD;  Location: [facility] OR;  Service: General;  Laterality: N/A;  PORTACATH PLACEMENT  07/21/2012   Procedure: INSERTION PORT-A-CATH;  Surgeon: Emelia Loron, MD;  Location:  SURGERY CENTER;  Service: General;  Laterality: Left;/ Replacement done 10'13   SIMPLE MASTECTOMY WITH AXILLARY SENTINEL NODE BIOPSY Left 05/10/2014   Procedure: PROPHYLACTIC LEFT MASTECTOMY;  Surgeon: Emelia Loron, MD;  Location: [facility] OR;  Service: General;  Laterality: Left;   TONSILLECTOMY     TUBAL LIGATION  ~ 1974   TUMOR EXCISION Right 1970   giant cell, off my forearm"   VIDEO BRONCHOSCOPY WITH ENDOBRONCHIAL NAVIGATION N/A 01/30/2021   Procedure: VIDEO BRONCHOSCOPY WITH  ENDOBRONCHIAL NAVIGATION FOR TUMOR MARKING;  Surgeon: Loreli Slot, MD;  Location: [facility] OR;  Service: Thoracic;  Laterality: N/A;   VIDEO BRONCHOSCOPY WITH ENDOBRONCHIAL NAVIGATION FOR TUMOR MARKING (N/A)  01/30/2021   XI ROBOTIC ASSISTED THORACOSCOPY- SEGMENTECTOMY Right 01/30/2021   Procedure: XI ROBOTIC ASSISTED THORACOSCOPY-POSTERIOR SEGMENTECTOMY RIGHT UPPER LOBE;  Surgeon: Loreli Slot, MD;  Location: [facility] OR;  Service: Thoracic;  Laterality: Right;   XI ROBOTIC ASSISTED THORACOSCOPY-POSTERIOR SEGMENTECTOMY RIGHT UPPER LOBE (Right)  01/30/2021    Current Medications: Current Meds  Medication Sig   albuterol (VENTOLIN HFA) 108 (90 Base) MCG/ACT inhaler INHALE 4 PUFFS BY MOUTH EVERY 4 TO 6 HOURS AS NEEDED FOR COUGH AND FOR WHEEZING .USE  WITH  SPACER.   atorvastatin (LIPITOR) 20 MG tablet Take 1 tablet (20 mg total) by mouth daily.   B Complex-C (SUPER B COMPLEX PO) Take 1 tablet by mouth at bedtime.    calcium-vitamin D (OSCAL WITH D) 500-200 MG-UNIT per tablet Take 1 tablet by mouth every other day.    COVID-19 mRNA bivalent vaccine, Pfizer, (PFIZER COVID-19 VAC BIVALENT) injection Inject into the muscle.   COVID-19 mRNA Vac-TriS, Pfizer, (PFIZER-BIONT COVID-19 VAC-TRIS) SUSP injection Inject into the muscle.   EPINEPHrine 0.3 mg/0.3 mL IJ SOAJ injection Use as directed for severe allergic reaction   famotidine (PEPCID) 20 MG tablet Take 20 mg by mouth 2 (two) times daily. 20 am and 10 pm   fluticasone (FLONASE) 50 MCG/ACT nasal spray Place 1 spray into both nostrils as needed for allergies or rhinitis.   fluticasone furoate-vilanterol (BREO ELLIPTA) 100-25 MCG/ACT AEPB Inhale 1 puff into the lungs daily.   glucosamine-chondroitin 500-400 MG tablet Take 1 tablet by mouth at bedtime.    lisinopril (PRINIVIL,ZESTRIL) 2.5 MG tablet Take 2.5 mg by mouth every evening.    Magnesium 250 MG TABS Take 250 mg by mouth at bedtime as needed (leg cramps).   Multiple Vitamin (MULTIVITAMIN  WITH MINERALS) TABS Take 1 tablet by mouth every other day.    rivaroxaban (XARELTO) 20 MG TABS tablet Take 1 tablet (20 mg total) by mouth daily with supper.   [DISCONTINUED] atorvastatin (LIPITOR) 10 MG tablet Take 10 mg by mouth daily.   [DISCONTINUED] metoprolol succinate (TOPROL-XL) 50 MG 24 hr tablet Take 1 tablet by mouth once daily     Allergies:   Alpha-gal; Anesthetics, amide; Sulfa antibiotics; Sulfamethoxazole-trimethoprim; Anesthesia s-i-40 [propofol]; Codeine; Codeine phosphate; Egg-derived products; Fish allergy; Milk-related compounds; and Other   Social History   Socioeconomic History   Marital status: Divorced    Spouse name: Not on file   Number of children: 1   Years of education: Not on file   Highest education level: Not on file  Occupational History    Employer: RETIRED    Comment: retired Hydrographic surveyor  Tobacco Use   Smoking status: Former    Packs/day: 1.00    Years: 15.00  Additional pack years: 0.00    Total pack years: 15.00    Types: Cigarettes    Quit date: 07/21/1979    Years since quitting: 43.7   Smokeless tobacco: Never  Vaping Use   Vaping Use: Never used  Substance and Sexual Activity   Alcohol use: No   Drug use: No   Sexual activity: Not Currently    Comment: menarche age 18, 8 1, menopause age 51, HRT x 3-5 yrs, low dosage  Other Topics Concern   Not on file  Social History Narrative   Not on file   Social Determinants of Health   Financial Resource Strain: Not on file  Food Insecurity: Not on file  Transportation Needs: Not on file  Physical Activity: Not on file  Stress: Not on file  Social Connections: Not on file     Family History: The patient's family history includes Allergic rhinitis in her father, sister, and son; Asthma in her father, sister, and son; Breast cancer (age of onset: 27) in her niece; Breast cancer (age of onset: 75) in her mother; Cancer in her maternal grandmother and paternal uncle; Diabetes in her  mother; Heart disease in her maternal aunt, maternal grandmother, maternal uncle, and mother; Hyperlipidemia in her mother; Hypertension in her mother; Lung cancer (age of onset: 35) in her father and sister; Skin cancer in her father. There is no history of Angioedema, Eczema, Immunodeficiency, or Urticaria.  ROS:   Please see the history of present illness.    No major lymphedema of left arm.  She has had this issue in the past.  Uses Ace wraps for control.  All other systems reviewed and are negative.  EKGs/Labs/Other Studies Reviewed:    The following studies were reviewed today: ECHO:  07/22/12 EF 60-65% lateral s' 10.5  10/2012 EF 60-65% lateral s' 10.7  01/19/13 EF 55-60% lateral S' 13.0 No evidence of RV strain.  03/21/13 EF 55-60% lateral s' 9.8 (remeasured by me today) Global Strain -16  06/20/13 EF 55-60% Lateral S' 9.3 Global Strain -24  09/21/13 EF 60% Lateral s' 9.9 Global strain -19%  CT of chest 03/24/2022-personally reviewed and interpreted coronary atherosclerosis in the LAD.  Small area.  She also has aortic arch atherosclerosis and mild descending aortic atherosclerosis.  Showed her the images.  EKG:  04/16/2023-sinus bradycardia 59 no other changes. Prior sinus rhythm 67  Recent Labs: 03/23/2023: ALT 15; BUN 24; Creatinine 1.05; Hemoglobin 13.3; Platelet Count 153; Potassium 4.6; Sodium 139  Recent Lipid Panel    Component Value Date/Time   CHOL 214 (H) 05/20/2013 1526   TRIG 226 (H) 05/20/2013 1526   HDL 45 05/20/2013 1526   CHOLHDL 4.8 05/20/2013 1526   VLDL 45 (H) 05/20/2013 1526   LDLCALC 124 (H) 05/20/2013 1526     Risk Assessment/Calculations:              Physical Exam:    VS:  BP 130/80   Pulse (!) 59   Ht 5\' 1"  (1.549 m)   Wt 129 lb (58.5 kg)   LMP  (LMP Unknown) Comment: tubal ligation  SpO2 97%   BMI 24.37 kg/m     Wt Readings from Last 3 Encounters:  04/16/23 129 lb (58.5 kg)  03/26/23 131 lb 3.2 oz (59.5 kg)  11/04/22 136 lb 14.4 oz  (62.1 kg)     GEN: Well nourished, well developed, in no acute distress HEENT: normal Neck: no JVD, carotid bruits, or masses Cardiac: RRR; no  murmurs, rubs, or gallops,no edema  Respiratory:  clear to auscultation bilaterally, normal work of breathing GI: soft, nontender, nondistended, + BS MS: no deformity or atrophy Skin: warm and dry, no rash Neuro:  Alert and Oriented x 3, Strength and sensation are intact Psych: euthymic mood, full affect   ASSESSMENT:    1. PAT (paroxysmal atrial tachycardia)   2. PSVT (paroxysmal supraventricular tachycardia)     PLAN:    In order of problems listed above:   PAT (paroxysmal atrial tachycardia) Continue with Toprol-XL.  Will change it to 90 days at her request.  She is not having any fullness or prewarning sensations.  Doing very well.  Toprol seems to be doing a good job at 50 mg for medical management.  Continue to refill.  No changes made.  Clotting disorder (HCC) Prior PE DVT in 2014.  She was positive for prothrombin gene mutation heterozygous.  Continue with Xarelto long-term.  No bleeding problems normal hemoglobin normal creatinine 13.3, 1.05  Coronary artery calcification - Personally reviewed and interpreted her latest CT scan on 03/23/2023.  There is LAD coronary calcification present.  We are going to increase her atorvastatin.  Hyperlipidemia - Increase atorvastatin from 10 mg up to 20 mg, LDL 79.  I would like her LDL to be less than 70.  Dr. Vincente Poli will be checking cholesterol panel in the future.   1 year with APP      Medication Adjustments/Labs and Tests Ordered: Current medicines are reviewed at length with the patient today.  Concerns regarding medicines are outlined above.  Orders Placed This Encounter  Procedures   EKG 12-Lead   Meds ordered this encounter  Medications   metoprolol succinate (TOPROL-XL) 50 MG 24 hr tablet    Sig: Take 1 tablet (50 mg total) by mouth daily. Take with or immediately  following a meal.    Dispense:  90 tablet    Refill:  3   atorvastatin (LIPITOR) 20 MG tablet    Sig: Take 1 tablet (20 mg total) by mouth daily.    Dispense:  90 tablet    Refill:  3    Patient Instructions  Medication Instructions:  Please increase your Atorvastatin to 20 mg a day. Continue all other medications as listed.  *If you need a refill on your cardiac medications before your next appointment, please call your pharmacy*  Follow-Up: At [facility], you and your health needs are our priority.  As part of our continuing mission to provide you with exceptional heart care, we have created designated Provider Care Teams.  These Care Teams include your primary Cardiologist (physician) and Advanced Practice Providers (APPs -  Physician Assistants and Nurse Practitioners) who all work together to provide you with the care you need, when you need it.  We recommend signing up for the patient portal called "MyChart".  Sign up information is provided on this After Visit Summary.  MyChart is used to connect with patients for Virtual Visits (Telemedicine).  Patients are able to view lab/test results, encounter notes, upcoming appointments, etc.  Non-urgent messages can be sent to your provider as well.   To learn more about what you can do with MyChart, go to [email or web address].    Your next appointment:   1 year(s)  Provider:   Jari Favre, PA-C, Robin Searing, NP, Jacolyn Reedy, PA-C, Eligha Bridegroom, NP, or Tereso Newcomer, PA-C            Signed, Donato Schultz, MD  04/16/2023  9:45 AM    [facility]

## 2023-04-16 NOTE — Patient Instructions (Addendum)
Medication Instructions:  Please increase your Atorvastatin to 20 mg a day. Continue all other medications as listed.  *If you need a refill on your cardiac medications before your next appointment, please call your pharmacy*  Follow-Up: At [facility], you and your health needs are our priority.  As part of our continuing mission to provide you with exceptional heart care, we have created designated Provider Care Teams.  These Care Teams include your primary Cardiologist (physician) and Advanced Practice Providers (APPs -  Physician Assistants and Nurse Practitioners) who all work together to provide you with the care you need, when you need it.  We recommend signing up for the patient portal called "MyChart".  Sign up information is provided on this After Visit Summary.  MyChart is used to connect with patients for Virtual Visits (Telemedicine).  Patients are able to view lab/test results, encounter notes, upcoming appointments, etc.  Non-urgent messages can be sent to your provider as well.   To learn more about what you can do with MyChart, go to [email or web address].    Your next appointment:   1 year(s)  Provider:   Jari Favre, PA-C, Robin Searing, NP, Jacolyn Reedy, PA-C, Eligha Bridegroom, NP, or Tereso Newcomer, PA-C

## 2023-04-23 DIAGNOSIS — E1169 Type 2 diabetes mellitus with other specified complication: Secondary | ICD-10-CM | POA: Diagnosis not present

## 2023-04-23 DIAGNOSIS — C3411 Malignant neoplasm of upper lobe, right bronchus or lung: Secondary | ICD-10-CM | POA: Diagnosis not present

## 2023-04-23 DIAGNOSIS — Z7901 Long term (current) use of anticoagulants: Secondary | ICD-10-CM | POA: Diagnosis not present

## 2023-04-23 DIAGNOSIS — D6852 Prothrombin gene mutation: Secondary | ICD-10-CM | POA: Diagnosis not present

## 2023-04-23 DIAGNOSIS — M654 Radial styloid tenosynovitis [de Quervain]: Secondary | ICD-10-CM | POA: Diagnosis not present

## 2023-04-23 DIAGNOSIS — Z1211 Encounter for screening for malignant neoplasm of colon: Secondary | ICD-10-CM | POA: Diagnosis not present

## 2023-04-23 DIAGNOSIS — Z0000 Encounter for general adult medical examination without abnormal findings: Secondary | ICD-10-CM | POA: Diagnosis not present

## 2023-04-23 DIAGNOSIS — E7800 Pure hypercholesterolemia, unspecified: Secondary | ICD-10-CM | POA: Diagnosis not present

## 2023-04-23 DIAGNOSIS — Z8619 Personal history of other infectious and parasitic diseases: Secondary | ICD-10-CM | POA: Diagnosis not present

## 2023-04-23 DIAGNOSIS — I4719 Other supraventricular tachycardia: Secondary | ICD-10-CM | POA: Diagnosis not present

## 2023-05-07 ENCOUNTER — Encounter: Payer: Self-pay | Admitting: Allergy & Immunology

## 2023-05-07 ENCOUNTER — Other Ambulatory Visit: Payer: Self-pay

## 2023-05-07 ENCOUNTER — Ambulatory Visit (INDEPENDENT_AMBULATORY_CARE_PROVIDER_SITE_OTHER): Payer: Medicare Other | Admitting: Allergy & Immunology

## 2023-05-07 VITALS — BP 138/76 | HR 59 | Temp 97.8°F | Resp 14 | Ht 61.0 in | Wt 127.4 lb

## 2023-05-07 DIAGNOSIS — T7800XD Anaphylactic reaction due to unspecified food, subsequent encounter: Secondary | ICD-10-CM | POA: Diagnosis not present

## 2023-05-07 DIAGNOSIS — J302 Other seasonal allergic rhinitis: Secondary | ICD-10-CM | POA: Diagnosis not present

## 2023-05-07 DIAGNOSIS — J3089 Other allergic rhinitis: Secondary | ICD-10-CM

## 2023-05-07 DIAGNOSIS — Z1211 Encounter for screening for malignant neoplasm of colon: Secondary | ICD-10-CM | POA: Diagnosis not present

## 2023-05-07 DIAGNOSIS — J4540 Moderate persistent asthma, uncomplicated: Secondary | ICD-10-CM

## 2023-05-07 DIAGNOSIS — Z1212 Encounter for screening for malignant neoplasm of rectum: Secondary | ICD-10-CM | POA: Diagnosis not present

## 2023-05-07 NOTE — Progress Notes (Signed)
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 Mild persistent asthma, uncomplicated - Lung testing looks great today.  - I think your breathing is under good control.  - Daily controller medication(s): NOTHING - Prior to physical activity: albuterol 2 puffs 10-15 minutes before physical activity. - Rescue medications: albuterol 4 puffs every 4-6 hours as needed - During respiratory flares: START Breo one puff once daily for 1-2 weeks - Asthma control goals:  * Full participation in all desired activities (may need albuterol before activity) * Albuterol use two time or less a week on average (not counting use with activity) * Cough interfering with sleep two time or less a month * Oral steroids no more than once a year * No hospitalizations  2. Chronic allergic rhinitis (grasses, weeds, molds, dust mite, cat, dog, feathers, horse, cockroach) - Continue with Flonase 1 spray per nostril daily. - Continue with antihistamines daily as needed.  3. Intense pruritis - with alpha gal sensitivity as well as wheat, milk, tree nuts, egg, chickpea, sunflower, barley - Continue to avoid red meat as you are doing.  - Continue with avoidance of your other triggering foods including milk and chickpea. - We could consider adding on the Xolair monthly when it gets to the point where you are having to avoid these reactions (it might allow you to tolerate the lower IgE foods).   4. Return in about 6 months (around 11/06/2023).  Subjective:    Jessica Wells is a 77 y.o. female presenting today for follow up of  Chief Complaint  Patient presents with   Follow-up    Jessica Wells has a history of the following: Patient Active Problem List   Diagnosis Date Noted   Genetic testing 10/08/2021   Family history of breast cancer 04/04/2021   Family history of lung cancer 04/04/2021   Adenocarcinoma of lung, stage 1, right (HCC) 02/02/2021   Seasonal and perennial allergic rhinitis 09/17/2017   S/P mastectomy 05/10/2014   Cancer of central portion of right female breast (HCC) 09/23/2013   Pulmonary nodule 01/03/2013   Acute respiratory failure with hypoxia (HCC) 12/29/2012   Hypokalemia 12/29/2012   Pulmonary embolism (HCC) 12/28/2012   Healthcare-associated pneumonia 12/27/2012   Acute respiratory distress 12/27/2012   Asthma with COPD    GERD (gastroesophageal reflux disease)    Dysrhythmia    Allergy    Arthritis    Clotting disorder (HCC)    History of chemotherapy    DVT (deep venous thrombosis) (HCC)    DVT of lower extremity, bilateral (HCC) 12/22/2012   PSVT (paroxysmal supraventricular tachycardia) 10/14/2012   PONV (postoperative nausea and vomiting) 09/13/2012   PAT (paroxysmal atrial tachycardia)     History obtained from: chart review and patient.  Jessica Wells is a 77 y.o. female presenting for a follow up visit. She was last seen in December 2023. At that time, lung testing looked good. We continued with the use of her controller medication on a PRN basis to be used only during flares.  We changed her to [facility] and continued with the albuterol. For her rhinitis, we continued with fluticasone one spray per nostril daily as well as antihistamines as needed. For her history of pruritus, we continued with her dietary avoidance measures including alpha gal as well as wheat, milk, tree nuts, egg, chickpea, sunflower, and barley.   Since the last visit, she has done remarkably well.   Asthma/Respiratory Symptom History:  She is doing well with her asthma.  She has used the [employer] 18 times from the original container. She has used albuterol 1-2 times since the last visit. Now she has been eliminating the schools and she is not doing the itching and the breathing problems. She has been off of prednisone.   She did have a follow up chest CT following her breast cancer surgery and everything looked stable. Full CT shown below:   IMPRESSION: Status post right upper lobe wedge resection with associated scarring and volume loss.   Prior medial right lower lobe nodule is no longer visualized, presumably infectious/inflammatory. Additional stable 3-5 mm nodules in the left lung, favored to be benign.   No findings suspicious for recurrent or metastatic disease.   Allergic Rhinitis Symptom History: She has been doing a good job of managing her allergies.  She seldom takes Flonase and never takes an antihistamine. She uses a rare Benadryl. She reports that she has issues with mold exposure causing a lot of shortness of breath. She has not had a cold in years.   Food Allergy Symptom History: She  has not been eating barley. They are putting it into just about everything now. She does a lot of label reading. She does not eat red meats. She has never liked milk on its own. She rarely has milk with her cereal. She is good with the [place] yogurt and some cheese. She has never liked humus and she is not missing the chickpea.   Otherwise she is doing great. 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 She is actually planning on giving all of her homestead to the nature conservancy when she does go into a retirement home. She is on the list for a [employer] apartment.  Otherwise, there have been no changes to her past medical history, surgical history, family history, or social history.    Review of Systems  Constitutional: Negative.  Negative for chills, fever, malaise/fatigue and weight loss.  HENT: Negative.   Negative for congestion, ear discharge, ear pain, sinus pain and sore throat.   Eyes:  Negative for pain, discharge and redness.  Respiratory:  Negative for cough, sputum production, shortness of breath and wheezing.   Cardiovascular: Negative.  Negative for chest pain and palpitations.  Gastrointestinal:  Negative for abdominal pain, constipation, diarrhea, heartburn, nausea and vomiting.  Skin: Negative.  Negative for itching and rash.  Neurological:  Negative for dizziness and headaches.  Endo/Heme/Allergies:  Negative for environmental allergies. Does not bruise/bleed easily.       Objective:   Blood pressure 138/76, pulse (!) 59, temperature 97.8 F (36.6 C), resp. rate 14, height 5\' 1"  (1.549 m), weight 127 lb 6 oz (57.8 kg), SpO2 97 %. Body mass index is 24.07 kg/m.    Physical Exam Vitals reviewed.  Constitutional:      Appearance: She is well-developed.     Comments: Bubbly female. Very lovely.   HENT:     Head: Normocephalic and atraumatic.     Right Ear: Tympanic membrane, ear canal and external  ear normal.     Left Ear: Tympanic membrane, ear canal and external ear normal.     Nose: No nasal deformity, septal deviation, mucosal edema or rhinorrhea.     Right Turbinates: Enlarged. Not swollen or pale.     Left Turbinates: Enlarged. Not swollen or pale.     Right Sinus: No maxillary sinus tenderness or frontal sinus tenderness.     Left Sinus: No maxillary sinus tenderness or frontal sinus tenderness.     Mouth/Throat:     Mouth: Mucous membranes are not pale and not dry.     Pharynx: Uvula midline.  Eyes:     General:        Right eye: No discharge.        Left eye: No discharge.     Conjunctiva/sclera: Conjunctivae normal.     Right eye: Right conjunctiva is not injected. No chemosis.    Left eye: Left conjunctiva is not injected. No chemosis.    Pupils: Pupils are equal, round, and reactive to light.  Cardiovascular:     Rate and Rhythm: Normal rate and  regular rhythm.     Heart sounds: Normal heart sounds.  Pulmonary:     Effort: Pulmonary effort is normal. No tachypnea, accessory muscle usage or respiratory distress.     Breath sounds: Normal breath sounds. No wheezing, rhonchi or rales.     Comments: Moving air well in all lung fields. Chest:     Chest wall: No tenderness.  Lymphadenopathy:     Cervical: No cervical adenopathy.  Skin:    General: Skin is warm.     Capillary Refill: Capillary refill takes less than 2 seconds.     Coloration: Skin is not pale.     Findings: No abrasion, erythema, petechiae or rash. Rash is not papular, urticarial or vesicular.     Comments: No eczematous or urticarial lesions noted.   Neurological:     Mental Status: She is alert.  Psychiatric:        Behavior: Behavior is cooperative.      Diagnostic studies:    Spirometry: results normal (FEV1: 1.60/89%, FVC: 2.23/96%, FEV1/FVC: 72%).    Spirometry consistent with normal pattern.    Allergy Studies: none        Malachi Bonds, MD  [facility]

## 2023-05-07 NOTE — Patient Instructions (Addendum)
1. Mild persistent asthma, uncomplicated - Lung testing looks great today.  - I think your breathing is under good control.  - Daily controller medication(s): NOTHING - Prior to physical activity: albuterol 2 puffs 10-15 minutes before physical activity. - Rescue medications: albuterol 4 puffs every 4-6 hours as needed - During respiratory flares: START Breo one puff once daily for 1-2 weeks - Asthma control goals:  * Full participation in all desired activities (may need albuterol before activity) * Albuterol use two time or less a week on average (not counting use with activity) * Cough interfering with sleep two time or less a month * Oral steroids no more than once a year * No hospitalizations  2. Chronic allergic rhinitis (grasses, weeds, molds, dust mite, cat, dog, feathers, horse, cockroach) - Continue with Flonase 1 spray per nostril daily. - Continue with antihistamines daily as needed.  3. Intense pruritis - with alpha gal sensitivity as well as wheat, milk, tree nuts, egg, chickpea, sunflower, barley - Continue to avoid red meat as you are doing.  - Continue with avoidance of your other triggering foods including milk and chickpea. - We could consider adding on the Xolair monthly when it gets to the point where you are having to avoid these reactions (it might allow you to tolerate the lower IgE foods).   4. Return in about 6 months (around 11/06/2023).  Please inform us of any Emergency Department visits, hospitalizations, or changes in symptoms. Call us before going to the ED for breathing or allergy symptoms since we might be able to fit you in for a sick visit. Feel free to contact us anytime with any questions, problems, or concerns.  It was a pleasure to see you again today!  Websites that have reliable patient information: 1. American Academy of Asthma, Allergy, and Immunology: www.aaaai.org 2. Food Allergy Research and Education (FARE): foodallergy.org 3.  Mothers of Asthmatics: http://www.asthmacommunitynetwork.org 4. American College of Allergy, Asthma, and Immunology: www.acaai.org   COVID-19 Vaccine Information can be found at: [email or web address] For questions related to vaccine distribution or appointments, please email [email or web address] or call [phone].   We realize that you might be concerned about having an allergic reaction to the COVID19 vaccines. To help with that concern, WE ARE OFFERING THE COVID19 VACCINES IN OUR OFFICE! Ask the front desk for dates!     "Like" [place] on Facebook and Instagram for our latest updates!       Make sure you are registered to vote! If you have moved or changed any of your contact information, you will need to get this updated before voting!  In some cases, you MAY be able to register to vote online: [email or web address]

## 2023-05-11 ENCOUNTER — Encounter: Payer: Self-pay | Admitting: Allergy & Immunology

## 2023-05-11 MED ORDER — FLUTICASONE FUROATE-VILANTEROL 100-25 MCG/ACT IN AEPB: 1.0000 | INHALATION_SPRAY | Freq: Every day | RESPIRATORY_TRACT | 1 refills | Status: DC

## 2023-05-11 MED ORDER — ALBUTEROL SULFATE HFA 108 (90 BASE) MCG/ACT IN AERS: INHALATION_SPRAY | RESPIRATORY_TRACT | 1 refills | Status: DC

## 2023-05-12 ENCOUNTER — Other Ambulatory Visit: Payer: Self-pay | Admitting: Allergy & Immunology

## 2023-05-28 DIAGNOSIS — E1169 Type 2 diabetes mellitus with other specified complication: Secondary | ICD-10-CM | POA: Diagnosis not present

## 2023-05-28 DIAGNOSIS — E7800 Pure hypercholesterolemia, unspecified: Secondary | ICD-10-CM | POA: Diagnosis not present

## 2023-08-27 DIAGNOSIS — N1831 Chronic kidney disease, stage 3a: Secondary | ICD-10-CM | POA: Diagnosis not present

## 2023-08-27 DIAGNOSIS — E7800 Pure hypercholesterolemia, unspecified: Secondary | ICD-10-CM | POA: Diagnosis not present

## 2023-08-27 DIAGNOSIS — I890 Lymphedema, not elsewhere classified: Secondary | ICD-10-CM | POA: Diagnosis not present

## 2023-09-10 ENCOUNTER — Other Ambulatory Visit: Payer: Self-pay | Admitting: Medical Genetics

## 2023-09-10 DIAGNOSIS — Z006 Encounter for examination for normal comparison and control in clinical research program: Secondary | ICD-10-CM

## 2023-09-18 DIAGNOSIS — Z23 Encounter for immunization: Secondary | ICD-10-CM | POA: Diagnosis not present

## 2023-10-23 DIAGNOSIS — Z23 Encounter for immunization: Secondary | ICD-10-CM | POA: Diagnosis not present

## 2023-11-10 ENCOUNTER — Other Ambulatory Visit: Payer: Self-pay

## 2023-11-10 ENCOUNTER — Ambulatory Visit (INDEPENDENT_AMBULATORY_CARE_PROVIDER_SITE_OTHER): Payer: Medicare Other | Admitting: Allergy & Immunology

## 2023-11-10 ENCOUNTER — Encounter: Payer: Self-pay | Admitting: Allergy & Immunology

## 2023-11-10 VITALS — BP 110/70 | HR 62 | Temp 96.7°F | Resp 18

## 2023-11-10 DIAGNOSIS — J3089 Other allergic rhinitis: Secondary | ICD-10-CM | POA: Diagnosis not present

## 2023-11-10 DIAGNOSIS — J302 Other seasonal allergic rhinitis: Secondary | ICD-10-CM

## 2023-11-10 DIAGNOSIS — J4540 Moderate persistent asthma, uncomplicated: Secondary | ICD-10-CM | POA: Diagnosis not present

## 2023-11-10 DIAGNOSIS — T7800XD Anaphylactic reaction due to unspecified food, subsequent encounter: Secondary | ICD-10-CM

## 2023-11-10 MED ORDER — ALBUTEROL SULFATE HFA 108 (90 BASE) MCG/ACT IN AERS: INHALATION_SPRAY | RESPIRATORY_TRACT | 0 refills | Status: DC

## 2023-11-10 MED ORDER — FLUTICASONE FUROATE-VILANTEROL 100-25 MCG/ACT IN AEPB: 1.0000 | INHALATION_SPRAY | Freq: Every day | RESPIRATORY_TRACT | 1 refills | Status: DC

## 2023-11-10 NOTE — Progress Notes (Signed)
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 Mild persistent asthma, uncomplicated - Lung testing looks great today.  - I think your breathing is under good control.  - Daily controller medication(s): NOTHING - Prior to physical activity: albuterol 2 puffs 10-15 minutes before physical activity. - Rescue medications: albuterol 4 puffs every 4-6 hours as needed - During respiratory flares: START Breo one puff once daily for 1-2 weeks - Asthma control goals:  * Full participation in all desired activities (may need albuterol before activity) * Albuterol use two time or less a week on average (not counting use with activity) * Cough interfering with sleep two time or less a month * Oral steroids no more than once a year * No hospitalizations  2. Chronic allergic rhinitis (grasses, weeds, molds, dust mite, cat, dog, feathers, horse, cockroach) - Continue with Flonase 1 spray per nostril daily. - Continue with antihistamines daily as needed.  3. Intense pruritis - with alpha gal sensitivity as well as wheat, milk, tree nuts, egg, chickpea, sunflower, barley - Continue to avoid red meat as you are doing.  - Continue with avoidance of your other triggering foods including milk and chickpea. - We could consider adding on the Xolair monthly when it gets to the point where you are having to avoid these reactions (it might allow you to tolerate the lower IgE foods).  - You seem to have an excellent handle on your symptoms.   4.  Return in about 6 months (around 05/10/2024).   Subjective:   Jessica Wells is a 77 y.o. female presenting today for follow up of  Chief Complaint  Patient presents with   Follow-up    No questions or concerns, patient states everything is going well.    Asthma    [name] has a history of the following: Patient Active Problem List   Diagnosis Date Noted   Genetic testing 10/08/2021   Family history of breast cancer 04/04/2021   Family history of lung cancer 04/04/2021   Adenocarcinoma of lung, stage 1, right (HCC) 02/02/2021   Seasonal and perennial allergic rhinitis 09/17/2017   S/P mastectomy 05/10/2014   Cancer of central portion of right female breast (HCC) 09/23/2013   Pulmonary nodule 01/03/2013   Acute respiratory failure with hypoxia (HCC) 12/29/2012   Hypokalemia 12/29/2012   Pulmonary embolism (HCC) 12/28/2012   Healthcare-associated pneumonia 12/27/2012   Acute respiratory distress 12/27/2012   Asthma with COPD (HCC)    GERD (gastroesophageal reflux disease)    Dysrhythmia    Allergy    Arthritis    Clotting disorder (HCC)    History of chemotherapy    DVT (deep venous thrombosis) (HCC)    DVT of lower extremity, bilateral (HCC) 12/22/2012   PSVT (paroxysmal supraventricular tachycardia) (HCC) 10/14/2012   PONV (postoperative nausea and vomiting) 09/13/2012   PAT (paroxysmal atrial tachycardia) (HCC)     History obtained from: chart review and patient.  Discussed the use of AI scribe software for clinical note transcription with the  patient and/or guardian, who gave verbal consent to proceed.  Jessica Wells is a 77 y.o. female presenting for a follow up visit.  We last saw her in June 2024.  At that time, we continue with albuterol as needed and started her on Breo 200 mcg 1 puff once daily for 1 to 2 weeks.  For her rhinitis, we continue with Flonase as well as antihistamines.  She was experiencing intense pruritus with alpha gal sensitivity as well as reactions  to wheat, milk, tree nuts, egg, chickpea, sunflower, and barley.  We did talk about doing Xolair monthly to help her introduce more foods, but she wanted to hold off since she was doing very well with the current plan.  Since the last visit, she has done very well.   Asthma/Respiratory Symptom History: She had her first cold in September or so. She caught it from her son. She came through it and did fine with it, thankfully. During this period, the patient used Breo and Ventolin to manage her symptoms. She notes that she has not had to use Breo since recovering from the cold.   Allergic Rhinitis Symptom History: Allergic rhinitis is under good control with the Flonase and an antihistamine as needed.  She has not been on antibiotics at all since last time we saw her.  Food Allergy Symptom History: The patient also discusses her allergies, which she manages by reading food labels and avoiding certain foods. She reports that she can tolerate small quantities of certain foods, such as ham, without experiencing significant allergic reactions.  All of her symptoms seem related to a dose dependent manner.   The patient also mentions a history of two-time cancers, which she has managed with regular healthcare and preventative measures. She reports that her oncologist has reduced the frequency of her CT scans from twice a year to once a year, indicating a stable condition.  Otherwise she is doing great. 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 She is actually planning on giving all of her homestead to the nature conservancy when she does go into a retirement home. She is on the list for a [employer] apartment.  The patient reports that she has had regular nature walks and participation in citizen science projects, such as identifying butterflies and studying bees. She expresses a desire to remain independent and continue her current activities.   She is going to travel to the [place] [facility] on a cruise with a friend in the next year. She seems very excited by this.  Otherwise, there have been no changes to her past medical history, surgical history, family history, or social history.    Review of systems otherwise negative other than that mentioned in the HPI.    Objective:   Blood pressure 110/70, pulse 62, temperature (!) 96.7 F (35.9 C), temperature source Temporal, resp. rate 18, SpO2 98%. There is no height or weight on file to calculate BMI.    Physical Exam Vitals reviewed.  Constitutional:      Appearance: She is well-developed.     Comments: Bubbly female. Very lovely. Cooperative with the exam.   HENT:     Head: Normocephalic and atraumatic.     Right Ear: Tympanic membrane, ear canal and external ear normal.     Left Ear: Tympanic membrane, ear canal and external ear normal.     Nose: No nasal deformity, septal deviation, mucosal edema or rhinorrhea.     Right Turbinates: Enlarged and swollen.  Not pale.     Left Turbinates: Enlarged and swollen. Not pale.     Right Sinus: No maxillary sinus tenderness or frontal sinus tenderness.     Left Sinus: No maxillary sinus tenderness or frontal sinus tenderness.     Mouth/Throat:     Mouth: Mucous membranes are not pale and not dry.     Pharynx: Uvula midline.  Eyes:     General:        Right eye: No discharge.        Left eye: No discharge.     Conjunctiva/sclera: Conjunctivae normal.     Right eye: Right conjunctiva is not injected. No chemosis.    Left eye: Left conjunctiva is not injected. No chemosis.    Pupils: Pupils are equal, round, and reactive to light.  Cardiovascular:     Rate and Rhythm: Normal rate and regular rhythm.     Heart sounds: Normal heart sounds.  Pulmonary:     Effort: Pulmonary effort is normal. No tachypnea, accessory muscle usage or respiratory distress.     Breath sounds: Normal breath sounds. No wheezing, rhonchi or rales.     Comments: Moving air well in all lung  fields. Chest:     Chest wall: No tenderness.  Lymphadenopathy:     Cervical: No cervical adenopathy.  Skin:    General: Skin is warm.     Capillary Refill: Capillary refill takes less than 2 seconds.     Coloration: Skin is not pale.     Findings: No abrasion, erythema, petechiae or rash. Rash is not papular, urticarial or vesicular.     Comments: No eczematous or urticarial lesions noted.   Neurological:     Mental Status: She is alert.  Psychiatric:        Behavior: Behavior is cooperative.      Diagnostic studies:    Spirometry: results normal (FEV1: 1.54/87%, FVC: 2.10/91%, FEV1/FVC: 73%).   Spirometry consistent with normal pattern.    Allergy Studies: none       Malachi Bonds, MD  [facility]

## 2023-11-10 NOTE — Patient Instructions (Addendum)
1. Mild persistent asthma, uncomplicated - Lung testing looks great today.  - I think your breathing is under good control.  - Daily controller medication(s): NOTHING - Prior to physical activity: albuterol 2 puffs 10-15 minutes before physical activity. - Rescue medications: albuterol 4 puffs every 4-6 hours as needed - During respiratory flares: START Breo one puff once daily for 1-2 weeks - Asthma control goals:  * Full participation in all desired activities (may need albuterol before activity) * Albuterol use two time or less a week on average (not counting use with activity) * Cough interfering with sleep two time or less a month * Oral steroids no more than once a year * No hospitalizations  2. Chronic allergic rhinitis (grasses, weeds, molds, dust mite, cat, dog, feathers, horse, cockroach) - Continue with Flonase 1 spray per nostril daily. - Continue with antihistamines daily as needed.  3. Intense pruritis - with alpha gal sensitivity as well as wheat, milk, tree nuts, egg, chickpea, sunflower, barley - Continue to avoid red meat as you are doing.  - Continue with avoidance of your other triggering foods including milk and chickpea. - We could consider adding on the Xolair monthly when it gets to the point where you are having to avoid these reactions (it might allow you to tolerate the lower IgE foods).  - You seem to have an excellent handle on your symptoms.   4. Return in about 6 months (around 05/10/2024).  Please inform us of any Emergency Department visits, hospitalizations, or changes in symptoms. Call us before going to the ED for breathing or allergy symptoms since we might be able to fit you in for a sick visit. Feel free to contact us anytime with any questions, problems, or concerns.  It was a pleasure to see you again today!  Websites that have reliable patient information: 1. American Academy of Asthma, Allergy, and Immunology: www.aaaai.org 2. Food  Allergy Research and Education (FARE): foodallergy.org 3. Mothers of Asthmatics: http://www.asthmacommunitynetwork.org 4. American College of Allergy, Asthma, and Immunology: www.acaai.org   COVID-19 Vaccine Information can be found at: [email or web address] For questions related to vaccine distribution or appointments, please email [email or web address] or call [phone].   We realize that you might be concerned about having an allergic reaction to the COVID19 vaccines. To help with that concern, WE ARE OFFERING THE COVID19 VACCINES IN OUR OFFICE! Ask the front desk for dates!     "Like" [place] on Facebook and Instagram for our latest updates!       Make sure you are registered to vote! If you have moved or changed any of your contact information, you will need to get this updated before voting!  In some cases, you MAY be able to register to vote online: [email or web address]

## 2023-12-10 DIAGNOSIS — H2513 Age-related nuclear cataract, bilateral: Secondary | ICD-10-CM | POA: Diagnosis not present

## 2024-03-02 ENCOUNTER — Telehealth: Payer: Self-pay | Admitting: Internal Medicine

## 2024-03-02 NOTE — Telephone Encounter (Signed)
 Rescheduled appointments around  CT scan. Left the patient a voicemail with the appointment changes and she will be mailed an appointment reminder.

## 2024-03-15 ENCOUNTER — Inpatient Hospital Stay

## 2024-03-15 ENCOUNTER — Inpatient Hospital Stay: Attending: Internal Medicine

## 2024-03-15 DIAGNOSIS — Z85118 Personal history of other malignant neoplasm of bronchus and lung: Secondary | ICD-10-CM | POA: Diagnosis not present

## 2024-03-15 DIAGNOSIS — Z853 Personal history of malignant neoplasm of breast: Secondary | ICD-10-CM | POA: Diagnosis not present

## 2024-03-15 DIAGNOSIS — C3490 Malignant neoplasm of unspecified part of unspecified bronchus or lung: Secondary | ICD-10-CM

## 2024-03-15 LAB — CMP (CANCER CENTER ONLY)
ALT: 17 U/L (ref 0–44)
AST: 18 U/L (ref 15–41)
Albumin: 4.2 g/dL (ref 3.5–5.0)
Alkaline Phosphatase: 54 U/L (ref 38–126)
Anion gap: 5 (ref 5–15)
BUN: 20 mg/dL (ref 8–23)
CO2: 30 mmol/L (ref 22–32)
Calcium: 9.4 mg/dL (ref 8.9–10.3)
Chloride: 104 mmol/L (ref 98–111)
Creatinine: 0.91 mg/dL (ref 0.44–1.00)
GFR, Estimated: >60 mL/min (ref >60)
Glucose, Bld: 125 mg/dL — ABNORMAL HIGH (ref 70–99)
Potassium: 4.3 mmol/L (ref 3.5–5.1)
Sodium: 139 mmol/L (ref 135–145)
Total Bilirubin: 0.6 mg/dL (ref 0.0–1.2)
Total Protein: 6.7 g/dL (ref 6.5–8.1)

## 2024-03-15 LAB — CBC WITH DIFFERENTIAL (CANCER CENTER ONLY)
Abs Immature Granulocytes: 0.01 10*3/uL (ref 0.00–0.07)
Basophils Absolute: 0.1 10*3/uL (ref 0.0–0.1)
Basophils Relative: 1 %
Eosinophils Absolute: 0.4 10*3/uL (ref 0.0–0.5)
Eosinophils Relative: 7 %
HCT: 37.6 % (ref 36.0–46.0)
Hemoglobin: 12.8 g/dL (ref 12.0–15.0)
Immature Granulocytes: 0 %
Lymphocytes Relative: 38 %
Lymphs Abs: 2 10*3/uL (ref 0.7–4.0)
MCH: 31.1 pg (ref 26.0–34.0)
MCHC: 34 g/dL (ref 30.0–36.0)
MCV: 91.5 fL (ref 80.0–100.0)
Monocytes Absolute: 0.5 10*3/uL (ref 0.1–1.0)
Monocytes Relative: 9 %
Neutro Abs: 2.4 10*3/uL (ref 1.7–7.7)
Neutrophils Relative %: 45 %
Platelet Count: 155 10*3/uL (ref 150–400)
RBC: 4.11 MIL/uL (ref 3.87–5.11)
RDW: 12 % (ref 11.5–15.5)
WBC Count: 5.4 10*3/uL (ref 4.0–10.5)
nRBC: 0 % (ref 0.0–0.2)

## 2024-03-22 ENCOUNTER — Telehealth: Payer: Self-pay | Admitting: *Deleted

## 2024-03-22 ENCOUNTER — Other Ambulatory Visit

## 2024-03-22 ENCOUNTER — Other Ambulatory Visit: Payer: Medicare Other

## 2024-03-22 ENCOUNTER — Other Ambulatory Visit: Payer: Self-pay | Admitting: Internal Medicine

## 2024-03-22 ENCOUNTER — Ambulatory Visit (HOSPITAL_COMMUNITY)
Admission: RE | Admit: 2024-03-22 | Discharge: 2024-03-22 | Disposition: A | Discharge status: Home / Self Care | Source: Ambulatory Visit | Attending: Internal Medicine | Admitting: Internal Medicine

## 2024-03-22 ENCOUNTER — Encounter (HOSPITAL_COMMUNITY): Payer: Self-pay

## 2024-03-22 DIAGNOSIS — C3490 Malignant neoplasm of unspecified part of unspecified bronchus or lung: Secondary | ICD-10-CM | POA: Diagnosis not present

## 2024-03-22 DIAGNOSIS — I700 Atherosclerosis of aorta: Secondary | ICD-10-CM | POA: Diagnosis not present

## 2024-03-22 MED ORDER — SODIUM CHLORIDE (PF) 0.9 % IJ SOLN
INTRAMUSCULAR | Status: AC
  Filled 2024-03-22: qty 50

## 2024-03-22 NOTE — Telephone Encounter (Signed)
 Radiology called to say that they were not able to access patient for CT with contrast. Pt only lets them stick one time. Will have to do CT without contrast.

## 2024-03-24 ENCOUNTER — Ambulatory Visit: Payer: Medicare Other | Admitting: Internal Medicine

## 2024-03-31 ENCOUNTER — Inpatient Hospital Stay: Attending: Internal Medicine | Admitting: Internal Medicine

## 2024-03-31 VITALS — BP 149/76 | HR 58 | Temp 97.7°F | Resp 15 | Ht 61.0 in | Wt 130.2 lb

## 2024-03-31 DIAGNOSIS — Z801 Family history of malignant neoplasm of trachea, bronchus and lung: Secondary | ICD-10-CM | POA: Diagnosis not present

## 2024-03-31 DIAGNOSIS — M8580 Other specified disorders of bone density and structure, unspecified site: Secondary | ICD-10-CM | POA: Diagnosis not present

## 2024-03-31 DIAGNOSIS — Z9221 Personal history of antineoplastic chemotherapy: Secondary | ICD-10-CM | POA: Insufficient documentation

## 2024-03-31 DIAGNOSIS — I700 Atherosclerosis of aorta: Secondary | ICD-10-CM | POA: Diagnosis not present

## 2024-03-31 DIAGNOSIS — C3490 Malignant neoplasm of unspecified part of unspecified bronchus or lung: Secondary | ICD-10-CM

## 2024-03-31 DIAGNOSIS — Z79899 Other long term (current) drug therapy: Secondary | ICD-10-CM | POA: Insufficient documentation

## 2024-03-31 DIAGNOSIS — Z86718 Personal history of other venous thrombosis and embolism: Secondary | ICD-10-CM | POA: Insufficient documentation

## 2024-03-31 DIAGNOSIS — Z7951 Long term (current) use of inhaled steroids: Secondary | ICD-10-CM | POA: Diagnosis not present

## 2024-03-31 DIAGNOSIS — Z853 Personal history of malignant neoplasm of breast: Secondary | ICD-10-CM | POA: Insufficient documentation

## 2024-03-31 DIAGNOSIS — Z803 Family history of malignant neoplasm of breast: Secondary | ICD-10-CM | POA: Diagnosis not present

## 2024-03-31 DIAGNOSIS — K219 Gastro-esophageal reflux disease without esophagitis: Secondary | ICD-10-CM | POA: Insufficient documentation

## 2024-03-31 DIAGNOSIS — K449 Diaphragmatic hernia without obstruction or gangrene: Secondary | ICD-10-CM | POA: Insufficient documentation

## 2024-03-31 DIAGNOSIS — Z86711 Personal history of pulmonary embolism: Secondary | ICD-10-CM | POA: Insufficient documentation

## 2024-03-31 DIAGNOSIS — Z7901 Long term (current) use of anticoagulants: Secondary | ICD-10-CM | POA: Insufficient documentation

## 2024-03-31 DIAGNOSIS — I890 Lymphedema, not elsewhere classified: Secondary | ICD-10-CM | POA: Insufficient documentation

## 2024-03-31 DIAGNOSIS — Z923 Personal history of irradiation: Secondary | ICD-10-CM | POA: Diagnosis not present

## 2024-03-31 DIAGNOSIS — D6852 Prothrombin gene mutation: Secondary | ICD-10-CM | POA: Diagnosis not present

## 2024-03-31 DIAGNOSIS — Z85118 Personal history of other malignant neoplasm of bronchus and lung: Secondary | ICD-10-CM | POA: Diagnosis not present

## 2024-03-31 DIAGNOSIS — Z8701 Personal history of pneumonia (recurrent): Secondary | ICD-10-CM | POA: Insufficient documentation

## 2024-03-31 NOTE — Progress Notes (Signed)
 [facility] [phone]   [phone]  OFFICE PROGRESS NOTE  Mordechai April, DO [address] [place] [address]  DIAGNOSIS:  1) Stage IA (T1 a, N0, M0) non-small cell lung cancer, adenocarcinoma presented with right upper lobe nodule  2) A history of inflammatory breast cancer involving the right breast, HR negative and HER2 positive status post neoadjuvant chemotherapy followed by right mastectomy followed by adjuvant therapy and she was followed by Dr. Meredeth Stallion and Dr. Lee Public.Aaron Aas  PRIOR THERAPY: status post right upper lobe superior segmentectomy with lymph node dissection under the care of Dr. Luna Salinas on January 30, 2021.  CURRENT THERAPY: Observation.   INTERVAL HISTORY: Kairah Brinkmeyer 78 y.o. female returns to the clinic today for annual follow-up visit.Discussed the use of AI scribe software for clinical note transcription with the patient, who gave verbal consent to proceed.  History of Present Illness   Tayra Steil is a 78 year old female with stage 1A non-small cell lung cancer who presents for evaluation with a repeat CT scan of the chest for restaging of her disease.  She has a history of stage 1A non-small cell lung cancer, adenocarcinoma, and underwent a right upper lobe superior segmentectomy with lymph node dissection in March 2022. Since then, she has been on observation and is here for a repeat CT scan to restage her disease. She feels good, having regained most of her stamina, and is active every day. No chest pain, breathing issues, or coughing. She maintains a stable weight of 130 pounds without recent weight loss.  She also has a history of inflammatory breast cancer involving the right breast. She inquired about lymphedema management due to issues with IV access and bruising during blood draws and CT scans. She cannot have an IV in either arm due to lymphedema on one side and difficulty with IV access on the other.  During the  review of symptoms, she reported no new symptoms or concerns. Her blood pressure was noted to be slightly elevated today, which she attributes to rushing to the appointment, but she states it is normally well-controlled at home.       MEDICAL HISTORY: Past Medical History:  Diagnosis Date   Allergic reaction to alpha-gal    Allergy     Anemia    "after chemo"   Arthritis    "knees; fingers; occasionally" (05/10/2014)   Asthma    Uses Inhalers Proventil  as needed   Breast cancer (HCC) 07/14/12   inflammatory right breast ca, ER/PR -   Bursitis    "both shoulders"   Clotting disorder (HCC)    prothrombin gene mutation-heterozygous    DVT (deep venous thrombosis) (HCC) 12/22/2012   BLE   Dysrhythmia    PAT-sees dr Wadie Guile meds   Family history of breast cancer 04/04/2021   Family history of lung cancer 04/04/2021   GERD (gastroesophageal reflux disease)    H/O hiatal hernia    History of blood transfusion 01-25-13   2 units 01-11-13 ("after chem")   History of chemotherapy    Last dose to be 02-04-13.  Was rx'd with Herceptin  & Gemzar    Lymphedema    RUE   Osteopenia    "lower spine only" (05/10/2014)   PAT (paroxysmal atrial tachycardia) (HCC)    hx   PE (pulmonary thromboembolism) (HCC) 12/27/2012   Pneumonia 01-25-13   hx. 12-27-12-hospital stay x 9 days, now resolved.   Pneumonia    "episodic; all my life" (  05/10/2014)   PONV (postoperative nausea and vomiting) 09-13-12   severe, with Port-a-cath, was managed without PONV   S/P radiation therapy 4-12 wks ago 03/31/13-05/17/13   right breast/supraclavicular fossa/posterior axillary boost    ALLERGIES:  is allergic to alpha-gal; anesthetics, amide; sulfa antibiotics; sulfamethoxazole-trimethoprim; anesthesia s-i-40 [propofol ]; codeine; codeine phosphate; egg-derived products; fish allergy ; milk-related compounds; and other.  MEDICATIONS:  Current Outpatient Medications  Medication Sig Dispense Refill   albuterol  (VENTOLIN  HFA)  108 (90 Base) MCG/ACT inhaler INHALE 4 PUFFS BY MOUTH EVERY 4 TO 6 HOURS AS NEEDED FOR COUGH AND FOR WHEEZING .USE  WITH  SPACER. 18 g 0   atorvastatin  (LIPITOR) 20 MG tablet Take 1 tablet (20 mg total) by mouth daily. 90 tablet 3   B Complex-C (SUPER B COMPLEX PO) Take 1 tablet by mouth at bedtime.      calcium -vitamin D (OSCAL WITH D) 500-200 MG-UNIT per tablet Take 1 tablet by mouth every other day.      diphenhydrAMINE  (BENADRYL  ALLERGY ) 25 MG tablet 1/2 tablet Orally at bedtime as needed     EPINEPHrine  0.3 mg/0.3 mL IJ SOAJ injection Use as directed for severe allergic reaction 2 each 1   famotidine  (PEPCID ) 20 MG tablet Take 20 mg by mouth 2 (two) times daily. 20 am and 10 pm     fluticasone  (FLONASE ) 50 MCG/ACT nasal spray Place 1 spray into both nostrils as needed for allergies or rhinitis. 16 g 5   fluticasone  furoate-vilanterol (BREO ELLIPTA ) 100-25 MCG/ACT AEPB Inhale 1 puff into the lungs daily. 60 each 1   glucosamine-chondroitin 500-400 MG tablet Take 1 tablet by mouth at bedtime.      lisinopril  (PRINIVIL ,ZESTRIL ) 2.5 MG tablet Take 2.5 mg by mouth every evening.      Magnesium  250 MG TABS Take 250 mg by mouth at bedtime as needed (leg cramps).     metoprolol  succinate (TOPROL -XL) 50 MG 24 hr tablet Take 1 tablet (50 mg total) by mouth daily. Take with or immediately following a meal. 90 tablet 3   Multiple Vitamin (MULTIVITAMIN ADULT) TABS 1 tablet Orally once a day     Multiple Vitamin (MULTIVITAMIN WITH MINERALS) TABS Take 1 tablet by mouth every other day.      rivaroxaban  (XARELTO ) 20 MG TABS tablet Take 1 tablet (20 mg total) by mouth daily with supper. 90 tablet 0   valACYclovir  (VALTREX ) 1000 MG tablet Take 1,000 mg by mouth 3 (three) times daily.     No current facility-administered medications for this visit.    SURGICAL HISTORY:  Past Surgical History:  Procedure Laterality Date   BREAST BIOPSY Right 2013 X 3   CHALAZION EXCISION Right 1980's   right( MD office)    COLONOSCOPY     DILATION AND CURETTAGE OF UTERUS  1998 X 3   INSERTION OF VENA CAVA FILTER  12/2012   INTERCOSTAL NERVE BLOCK Right 01/30/2021   Procedure: INTERCOSTAL NERVE BLOCK;  Surgeon: Zelphia Higashi, MD;  Location: [facility] OR;  Service: Thoracic;  Laterality: Right;   MASTECTOMY Left 05/10/2014   PROPHYLACTIC    MASTECTOMY MODIFIED RADICAL Right 02/08/2013   Procedure: MASTECTOMY MODIFIED RADICAL;  Surgeon: Enid Harry, MD;  Location: [facility] ORS;  Service: General;  Laterality: Right;   MASTECTOMY MODIFIED RADICAL Right    NODE DISSECTION Right 01/30/2021   Procedure: NODE DISSECTION;  Surgeon: Zelphia Higashi, MD;  Location: [facility] OR;  Service: Thoracic;  Laterality: Right;   NODE DISSECTION (Right)  01/30/2021  PORT-A-CATH REMOVAL  05/10/2014   PORT-A-CATH REMOVAL N/A 05/10/2014   Procedure: REMOVAL PORT-A-CATH;  Surgeon: Enid Harry, MD;  Location: [facility] OR;  Service: General;  Laterality: N/A;   PORTACATH PLACEMENT  07/21/2012   Procedure: INSERTION PORT-A-CATH;  Surgeon: Enid Harry, MD;  Location: [facility];  Service: General;  Laterality: Left;/ Replacement done 10'13   SIMPLE MASTECTOMY WITH AXILLARY SENTINEL NODE BIOPSY Left 05/10/2014   Procedure: PROPHYLACTIC LEFT MASTECTOMY;  Surgeon: Enid Harry, MD;  Location: [facility] OR;  Service: General;  Laterality: Left;   TONSILLECTOMY     TUBAL LIGATION  ~ 1974   TUMOR EXCISION Right 1970   giant cell, off my forearm"   VIDEO BRONCHOSCOPY WITH ENDOBRONCHIAL NAVIGATION N/A 01/30/2021   Procedure: VIDEO BRONCHOSCOPY WITH ENDOBRONCHIAL NAVIGATION FOR TUMOR MARKING;  Surgeon: Zelphia Higashi, MD;  Location: [facility] OR;  Service: Thoracic;  Laterality: N/A;   VIDEO BRONCHOSCOPY WITH ENDOBRONCHIAL NAVIGATION FOR TUMOR MARKING (N/A)  01/30/2021   XI ROBOTIC ASSISTED THORACOSCOPY- SEGMENTECTOMY Right 01/30/2021   Procedure: XI ROBOTIC ASSISTED THORACOSCOPY-POSTERIOR SEGMENTECTOMY RIGHT UPPER LOBE;  Surgeon:  Zelphia Higashi, MD;  Location: [facility] OR;  Service: Thoracic;  Laterality: Right;   XI ROBOTIC ASSISTED THORACOSCOPY-POSTERIOR SEGMENTECTOMY RIGHT UPPER LOBE (Right)  01/30/2021    REVIEW OF SYSTEMS:  A comprehensive review of systems was negative.   PHYSICAL EXAMINATION: General appearance: alert, cooperative, and no distress Head: Normocephalic, without obvious abnormality, atraumatic Neck: no adenopathy, no JVD, supple, symmetrical, trachea midline, and thyroid  not enlarged, symmetric, no tenderness/mass/nodules Lymph nodes: Cervical, supraclavicular, and axillary nodes normal. Resp: clear to auscultation bilaterally Back: symmetric, no curvature. ROM normal. No CVA tenderness. Cardio: regular rate and rhythm, S1, S2 normal, no murmur, click, rub or gallop GI: soft, non-tender; bowel sounds normal; no masses,  no organomegaly Extremities: extremities normal, atraumatic, no cyanosis or edema  ECOG PERFORMANCE STATUS: 0 - Asymptomatic  Blood pressure (!) 149/76, pulse (!) 58, temperature 97.7 F (36.5 C), temperature source Temporal, resp. rate 15, height 5\' 1"  (1.549 m), weight 130 lb 3.2 oz (59.1 kg), SpO2 99%.  LABORATORY DATA: Lab Results  Component Value Date   WBC 5.4 03/15/2024   HGB 12.8 03/15/2024   HCT 37.6 03/15/2024   MCV 91.5 03/15/2024   PLT 155 03/15/2024      Chemistry      Component Value Date/Time   NA 139 03/15/2024 0751   NA 139 10/15/2017 0811   K 4.3 03/15/2024 0751   K 4.0 10/15/2017 0811   CL 104 03/15/2024 0751   CL 105 05/20/2013 1459   CO2 30 03/15/2024 0751   CO2 27 10/15/2017 0811   BUN 20 03/15/2024 0751   BUN 25.6 10/15/2017 0811   CREATININE 0.91 03/15/2024 0751   CREATININE 0.9 10/15/2017 0811      Component Value Date/Time   CALCIUM  9.4 03/15/2024 0751   CALCIUM  9.6 10/15/2017 0811   ALKPHOS 54 03/15/2024 0751   ALKPHOS 51 10/15/2017 0811   AST 18 03/15/2024 0751   AST 18 10/15/2017 0811   ALT 17 03/15/2024 0751   ALT 23  10/15/2017 0811   BILITOT 0.6 03/15/2024 0751   BILITOT 0.68 10/15/2017 0811       RADIOGRAPHIC STUDIES: CT CHEST WO CONTRAST Result Date: 03/28/2024 CLINICAL DATA:  Non-small cell lung cancer, breast cancer. Follow-up examination. * Tracking Code: BO * EXAM: CT CHEST WITHOUT CONTRAST TECHNIQUE: Multidetector CT imaging of the chest was performed following the standard protocol without IV contrast.  RADIATION DOSE REDUCTION: This exam was performed according to the departmental dose-optimization program which includes automated exposure control, adjustment of the mA and/or kV according to patient size and/or use of iterative reconstruction technique. COMPARISON:  03/23/2023 FINDINGS: Cardiovascular: Mild coronary artery calcification. Global cardiac size within normal limits. No pericardial effusion. The central pulmonary arteries are of normal caliber. Mild atherosclerotic calcification within thoracic aorta. No aortic aneurysm. Mediastinum/Nodes: Visualized thyroid  is unremarkable. No pathologic thoracic adenopathy. The esophagus is unremarkable. Moderate hiatal hernia. Lungs/Pleura: Surgical changes of partial right upper lobectomy are again identified with mild associated parenchymal scarring. Subpleural fibrotic change within anterior right upper and right middle lobes are again noted in keeping with post radiation change. Scattered solid and subsolid 3-6 mm pulmonary nodules are again identified scattered throughout the left upper and left lower lobes, with the dominant nodule seen at axial image # 86/7, stable since remote prior examination of 01/28/2021 and safely considered. The lungs are otherwise clear. No pneumothorax or pleural effusion. Upper Abdomen: No acute abnormality. Musculoskeletal: Status post bilateral mastectomy. No acute bone abnormality. No lytic or blastic bone lesion. Osseous structures are age appropriate. IMPRESSION: 1. Status post partial right upper lobectomy without evidence  of residual or recurrent disease within the thorax. 2. Scattered solid and subsolid 3-6 mm pulmonary nodules within the left upper and left lower lobes, stable since remote prior examination of 01/28/2021 and safely considered benign. 3. Mild coronary artery calcification. 4. Moderate hiatal hernia. 5. Status post bilateral mastectomy. Aortic Atherosclerosis (ICD10-I70.0). Electronically Signed   By: Worthy Heads M.D.   On: 03/28/2024 01:30    ASSESSMENT AND PLAN: This is a very pleasant 78 years old white female diagnosed with stage IA (T1 a, N0, M0) non-small cell lung cancer, adenocarcinoma presented with right upper lobe lung nodule status post right upper lobe superior segmentectomy with lymph node dissection under the care of Dr. Luna Salinas on January 30, 2021. The patient also has a history of inflammatory breast cancer involving the right breast with ER/PR negative and HER2 positive status post neoadjuvant chemotherapy followed by right mastectomy and adjuvant chemotherapy under the care of Dr. Meredeth Stallion and Dr. Lee Public. The patient is currently on observation. She had repeat CT scan of the chest performed recently that showed no concerning findings for disease recurrence or progression.     Stage 1A non-small cell lung cancer, adenocarcinoma Status post right upper lobe superior segmentectomy with lymph node dissection in March 2022. Currently asymptomatic with no chest pain, dyspnea, or cough. Recent CT scan shows unchanged subsolid nodules, suggesting benign nature. No weight loss; weight stable at 130 pounds. Blood pressure slightly elevated today but usually well-controlled at home. Plan to continue observation and perform future scans without contrast due to good health status and absence of significant changes. - Continue observation - Schedule repeat CT scan in one year without contrast  Inflammatory breast cancer Previously involving the right breast, with no current symptoms or issues.  Advised to consult Dr. Godina or her assistant for lymphedema management. - Refer to Dr. Godina or her assistant for lymphedema management  Lymphedema Likely related to previous breast cancer treatment. Reports difficulty with IV access due to lymphedema and bruising from previous blood draws and CT scans. Advised against using the affected arm for IV access. No immediate concerns as scans can be performed without contrast, and alternative access can be found in emergencies. - Avoid using the affected arm for IV access - Perform scans without contrast when possible  The patient was advised to call immediately if she has any other concerning symptoms in the interval. The patient voices understanding of current disease status and treatment options and is in agreement with the current care plan.  All questions were answered. The patient knows to call the clinic with any problems, questions or concerns. We can certainly see the patient much sooner if necessary.  The total time spent in the appointment was 20 minutes.  Disclaimer: This note was dictated with voice recognition software. Similar sounding words can inadvertently be transcribed and may not be corrected upon review.

## 2024-04-22 ENCOUNTER — Other Ambulatory Visit: Payer: Self-pay | Admitting: Cardiology

## 2024-04-28 DIAGNOSIS — E7800 Pure hypercholesterolemia, unspecified: Secondary | ICD-10-CM | POA: Diagnosis not present

## 2024-04-28 DIAGNOSIS — Z85118 Personal history of other malignant neoplasm of bronchus and lung: Secondary | ICD-10-CM | POA: Diagnosis not present

## 2024-04-28 DIAGNOSIS — E1169 Type 2 diabetes mellitus with other specified complication: Secondary | ICD-10-CM | POA: Diagnosis not present

## 2024-04-28 DIAGNOSIS — Z7901 Long term (current) use of anticoagulants: Secondary | ICD-10-CM | POA: Diagnosis not present

## 2024-04-28 DIAGNOSIS — M8580 Other specified disorders of bone density and structure, unspecified site: Secondary | ICD-10-CM | POA: Diagnosis not present

## 2024-04-28 DIAGNOSIS — Z0000 Encounter for general adult medical examination without abnormal findings: Secondary | ICD-10-CM | POA: Diagnosis not present

## 2024-05-03 ENCOUNTER — Other Ambulatory Visit (HOSPITAL_BASED_OUTPATIENT_CLINIC_OR_DEPARTMENT_OTHER): Payer: Self-pay | Admitting: Family Medicine

## 2024-05-03 DIAGNOSIS — M8580 Other specified disorders of bone density and structure, unspecified site: Secondary | ICD-10-CM

## 2024-05-03 DIAGNOSIS — M8588 Other specified disorders of bone density and structure, other site: Secondary | ICD-10-CM

## 2024-05-10 ENCOUNTER — Encounter: Payer: Self-pay | Admitting: Allergy & Immunology

## 2024-05-10 ENCOUNTER — Ambulatory Visit: Payer: Medicare Other | Admitting: Allergy & Immunology

## 2024-05-10 ENCOUNTER — Other Ambulatory Visit: Payer: Self-pay

## 2024-05-10 VITALS — BP 104/60 | HR 70 | Temp 97.9°F | Resp 16 | Ht 60.0 in | Wt 128.6 lb

## 2024-05-10 DIAGNOSIS — J302 Other seasonal allergic rhinitis: Secondary | ICD-10-CM | POA: Diagnosis not present

## 2024-05-10 DIAGNOSIS — T7800XD Anaphylactic reaction due to unspecified food, subsequent encounter: Secondary | ICD-10-CM

## 2024-05-10 DIAGNOSIS — J3089 Other allergic rhinitis: Secondary | ICD-10-CM | POA: Diagnosis not present

## 2024-05-10 DIAGNOSIS — J4540 Moderate persistent asthma, uncomplicated: Secondary | ICD-10-CM | POA: Diagnosis not present

## 2024-05-10 NOTE — Patient Instructions (Addendum)
 1. Mild persistent asthma, uncomplicated - Lung testing looks great today.  - I think your breathing is under good control.  - Daily controller medication(s): NOTHING - Prior to physical activity: albuterol  2 puffs 10-15 minutes before physical activity. - Rescue medications: albuterol  4 puffs every 4-6 hours as needed - During respiratory flares: START Breo 200mcg one puff once daily for 1-2 weeks - Asthma control goals:  * Full participation in all desired activities (may need albuterol  before activity) * Albuterol  use two time or less a week on average (not counting use with activity) * Cough interfering with sleep two time or less a month * Oral steroids no more than once a year * No hospitalizations  2. Chronic allergic rhinitis (grasses, weeds, molds, dust mite, cat, dog, feathers, horse, cockroach) - Continue with Flonase  1 spray per nostril daily. - Continue with antihistamines daily as needed.  3. Intense pruritis - with alpha gal sensitivity as well as wheat, milk, tree nuts, egg, chickpea, sunflower, barley - Continue to avoid red meat as you are doing.  - Continue with avoidance of your other triggering foods including milk and chickpea. - We could consider adding on the Xolair monthly when it gets to the point where you are having to avoid these reactions (it might allow you to tolerate the lower IgE foods).  - You seem to have an excellent handle on your symptoms.   4. Return in about 6 months (around 11/09/2024). You can have the follow up appointment with Dr. Idolina Maker or a Nurse Practicioner (our Nurse Practitioners are excellent and always have Physician oversight!).    Please inform us  of any Emergency Department visits, hospitalizations, or changes in symptoms. Call us  before going to the ED for breathing or allergy  symptoms since we might be able to fit you in for a sick visit. Feel free to contact us  anytime with any questions, problems, or concerns.  It was a  pleasure to see you again today!  Websites that have reliable patient information: 1. American Academy of Asthma, Allergy , and Immunology: www.aaaai.org 2. Food Allergy  Research and Education (FARE): foodallergy.org 3. Mothers of Asthmatics: http://www.asthmacommunitynetwork.org 4. American College of Allergy , Asthma, and Immunology: www.acaai.org      "Like" us  on Facebook and Instagram for our latest updates!      A healthy democracy works best when [employer] participate! Make sure you are registered to vote! If you have moved or changed any of your contact information, you will need to get this updated before voting! Scan the QR codes below to learn more!

## 2024-05-10 NOTE — Progress Notes (Signed)
 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 Mild persistent asthma, uncomplicated - Lung testing looks great today.  - I think your breathing is under good control.  - Daily controller medication(s): NOTHING - Prior to physical activity: albuterol  2 puffs 10-15 minutes before physical activity. - Rescue medications: albuterol  4 puffs every 4-6 hours as needed - During respiratory flares: START Breo 200mcg one puff once daily for 1-2 weeks - Asthma control goals:  * Full participation in all desired activities (may need albuterol  before activity) * Albuterol  use two time or less a week on average (not counting use with activity) * Cough interfering with sleep two time or less a month * Oral steroids no more than once a year * No hospitalizations  2. Chronic allergic rhinitis (grasses, weeds, molds, dust mite, cat, dog, feathers, horse, cockroach) - Continue with Flonase  1 spray per nostril daily. - Continue with antihistamines daily as needed.  3. Intense pruritis - with alpha gal sensitivity as well as wheat, milk, tree nuts, egg, chickpea, sunflower, barley - Continue to avoid red meat as you are doing.  - Continue with avoidance of your other triggering foods including milk and chickpea. - We could consider adding on the Xolair monthly when it gets to the point where you are having to avoid these reactions (it might allow you to tolerate the lower IgE foods).  - You seem to have an excellent handle on your symptoms.   4.  Return in about 6 months (around 11/09/2024). You can have the follow up appointment with Dr. Idolina Maker or a Nurse Practicioner (our Nurse Practitioners are excellent and always have Physician oversight!).   Subjective:   Jessica Wells is a 78 y.o. female presenting today for follow up of  Chief Complaint  Patient presents with   Allergic Rhinitis     No compliants.   Asthma    Reports controlled. Does not use daily, only has a rescue inhaler PRN. ACT: 34    Jessica Wells has a history of the following: Patient Active Problem List   Diagnosis Date Noted   Genetic testing 10/08/2021   Family history of breast cancer 04/04/2021   Family history of lung cancer 04/04/2021   Adenocarcinoma of lung, stage 1, right (HCC) 02/02/2021   Seasonal and perennial allergic rhinitis 09/17/2017   S/P mastectomy 05/10/2014   Cancer of central portion of right female breast (HCC) 09/23/2013   Pulmonary nodule 01/03/2013   Acute respiratory failure with hypoxia (HCC) 12/29/2012   Hypokalemia 12/29/2012   Pulmonary embolism (HCC) 12/28/2012   Healthcare-associated pneumonia 12/27/2012   Acute respiratory distress 12/27/2012   Asthma with COPD (HCC)    GERD (gastroesophageal reflux disease)    Dysrhythmia    Allergy     Arthritis    Clotting disorder (HCC)    History of chemotherapy    DVT (deep venous thrombosis) (HCC)    DVT of lower extremity, bilateral (HCC) 12/22/2012   PSVT (paroxysmal supraventricular tachycardia) (HCC) 10/14/2012   PONV (postoperative nausea and  vomiting) 09/13/2012   PAT (paroxysmal atrial tachycardia) (HCC)     History obtained from: chart review and patient.  Discussed the use of AI scribe software for clinical note transcription with the patient and/or guardian, who gave verbal consent to proceed.  Jessica Wells is a 78 y.o. female presenting for a follow up visit.  She was last seen in December 2024.  At that time, when testing was great.  We continued with no controller  medication.  Does have Breo that she adds during flares.  For her rhinitis, we continue with Flonase  as well as antihistamines.  She also continue to avoid certain foods, which resulted in intense pruritus.  Since last visit, she has done well.  Asthma/Respiratory Symptom History: She has a history of lung cancer, currently in remission, with her latest CT scan being clear. She is now on an annual follow-up schedule. She feels more winded when walking uphill, which she attributes to her age and past lung issues.  She uses Breo very infrequently.  She has been using it around 2-3 times in total since I last saw her. She mentions using Breo only once or twice, with no frequent use needed.  Allergic Rhinitis Symptom History: She has been experiencing severe allergy  symptoms this year, including significant pruritus and watering of the eyes, sneezing, and rhinorrhea. She has been using Flonase  intermittently, taking it for a week at a time before stopping, and has recently been off it.   Food Allergy  Symptom History: She manages her allergies by avoiding certain foods and using topical creams. She can consume a small amount of red meat without adverse effects but avoids foods with high levels of sunflower oil.  He has not needed her EpiPen .  She seems to be able to manage her exposures and modulate her wrist.  Her social history includes being active in environmental causes, though she has stepped back since her lung cancer diagnosis. She engages in activities like nature walks and limits her exposure to stressors such as excessive news consumption.   CT (April 2025)  IMPRESSION: 1. Status post partial right upper lobectomy without evidence of residual or recurrent disease within the thorax. 2. Scattered solid and subsolid 3-6 mm pulmonary nodules within the left upper and left lower lobes, stable since remote prior examination of 01/28/2021 and safely considered benign. 3. Mild coronary artery  calcification. 4. Moderate hiatal hernia. 5. Status post bilateral mastectomy.  Otherwise, there have been no changes to her past medical history, surgical history, family history, or social history.    Review of systems otherwise negative other than that mentioned in the HPI.    Objective:   Blood pressure 104/60, pulse 70, temperature 97.9 F (36.6 C), temperature source Temporal, resp. rate 16, height 5' (1.524 m), weight 128 lb 9.6 oz (58.3 kg), SpO2 96%. Body mass index is 25.12 kg/m.    Physical Exam Vitals reviewed.  Constitutional:      Appearance: She is well-developed.     Comments: Bubbly female. Very lovely. Cooperative with the exam.   HENT:     Head: Normocephalic and atraumatic.     Right Ear: Tympanic membrane, ear canal and external ear normal.     Left Ear: Tympanic membrane, ear canal and external ear normal.     Nose: No nasal deformity, septal deviation, mucosal edema or rhinorrhea.     Right Turbinates: Enlarged, swollen and pale.     Left Turbinates: Enlarged, swollen and pale.     Right Sinus:  No maxillary sinus tenderness or frontal sinus tenderness.     Left Sinus: No maxillary sinus tenderness or frontal sinus tenderness.     Mouth/Throat:     Mouth: Mucous membranes are not pale and not dry.     Pharynx: Uvula midline.  Eyes:     General:        Right eye: No discharge.        Left eye: No discharge.     Conjunctiva/sclera: Conjunctivae normal.     Right eye: Right conjunctiva is not injected. No chemosis.    Left eye: Left conjunctiva is not injected. No chemosis.    Pupils: Pupils are equal, round, and reactive to light.  Cardiovascular:     Rate and Rhythm: Normal rate and regular rhythm.     Heart sounds: Normal heart sounds.  Pulmonary:     Effort: Pulmonary effort is normal. No tachypnea, accessory muscle usage or respiratory distress.     Breath sounds: Normal breath sounds. No wheezing, rhonchi or rales.     Comments: Moving air  well in all lung fields. Chest:     Chest wall: No tenderness.  Lymphadenopathy:     Cervical: No cervical adenopathy.  Skin:    General: Skin is warm.     Capillary Refill: Capillary refill takes less than 2 seconds.     Coloration: Skin is not pale.     Findings: No abrasion, erythema, petechiae or rash. Rash is not papular, urticarial or vesicular.     Comments: No eczematous or urticarial lesions noted.   Neurological:     Mental Status: She is alert.  Psychiatric:        Behavior: Behavior is cooperative.      Diagnostic studies:    Spirometry: results normal (FEV1: 1.51/89%, FVC: 2.07/93%, FEV1/FVC: 73%).    Spirometry consistent with normal pattern.   Allergy  Studies: none       Drexel Gentles, MD  [facility] 

## 2024-05-12 DIAGNOSIS — E7800 Pure hypercholesterolemia, unspecified: Secondary | ICD-10-CM | POA: Diagnosis not present

## 2024-05-12 DIAGNOSIS — M8580 Other specified disorders of bone density and structure, unspecified site: Secondary | ICD-10-CM | POA: Diagnosis not present

## 2024-05-12 DIAGNOSIS — E1169 Type 2 diabetes mellitus with other specified complication: Secondary | ICD-10-CM | POA: Diagnosis not present

## 2024-05-21 ENCOUNTER — Other Ambulatory Visit: Payer: Self-pay | Admitting: Cardiology

## 2024-05-25 ENCOUNTER — Other Ambulatory Visit: Payer: Self-pay | Admitting: Cardiology

## 2024-06-16 ENCOUNTER — Ambulatory Visit (HOSPITAL_BASED_OUTPATIENT_CLINIC_OR_DEPARTMENT_OTHER)
Admission: RE | Admit: 2024-06-16 | Discharge: 2024-06-16 | Disposition: A | Discharge status: Home / Self Care | Source: Ambulatory Visit | Attending: Family Medicine | Admitting: Family Medicine

## 2024-06-16 DIAGNOSIS — M8580 Other specified disorders of bone density and structure, unspecified site: Secondary | ICD-10-CM | POA: Insufficient documentation

## 2024-06-16 DIAGNOSIS — M8588 Other specified disorders of bone density and structure, other site: Secondary | ICD-10-CM | POA: Insufficient documentation

## 2024-06-16 DIAGNOSIS — Z780 Asymptomatic menopausal state: Secondary | ICD-10-CM | POA: Diagnosis not present

## 2024-06-16 DIAGNOSIS — M8589 Other specified disorders of bone density and structure, multiple sites: Secondary | ICD-10-CM | POA: Diagnosis not present

## 2024-06-21 ENCOUNTER — Ambulatory Visit: Admitting: Cardiology

## 2024-06-21 ENCOUNTER — Encounter: Payer: Self-pay | Admitting: Cardiology

## 2024-06-21 VITALS — BP 108/56 | HR 69 | Ht 60.5 in | Wt 129.2 lb

## 2024-06-21 DIAGNOSIS — I4719 Other supraventricular tachycardia: Secondary | ICD-10-CM | POA: Diagnosis not present

## 2024-06-21 DIAGNOSIS — I471 Supraventricular tachycardia, unspecified: Secondary | ICD-10-CM | POA: Diagnosis not present

## 2024-06-21 NOTE — Patient Instructions (Signed)

## 2024-06-21 NOTE — Progress Notes (Signed)
 Cardiology Office Note:  .   Date:  06/21/2024  ID:  Jessica Wells, DOB 25-May-1946, MRN [id number] PCP: Dayna Motto, DO   [facility] Providers Cardiologist:  Oneil Parchment, MD     History of Present Illness: Jessica Wells is a 78 y.o. female Discussed the use of AI scribe software for clinical note transcription with the patient, who gave verbal consent to proceed.  History of Present Illness Jessica Wells is a 78 year old female who presents for follow-up and oxygen  recertification.  She has a history of paroxysmal supraventricular tachycardia (PSVT), paroxysmal atrial tachycardia, and a prior deep vein thrombosis (DVT). She has been previously treated for right-sided inflammatory breast cancer and is on chronic anticoagulation therapy. She was previously seen while on Herceptin  and has experienced right arm lymphedema, for which she uses ACE wraps.  She reports no current arrhythmias or sensations of arrhythmias. She states, 'I have absolutely no arrhythmia or even the sensation anymore of one coming on.' She has been taking Toprol  (metoprolol ) 50 mg once daily, which she has been on for a while. She also takes a low dose of lisinopril  and Xarelto  20 mg for her prior DVT.  Her past medical history includes non-small cell lung cancer, and she has undergone surgery with a normal post-surgery CT scan. She had LAD calcification present, for which she was started on a statin. Her LDL is 70, hemoglobin is 12.8, potassium is 4.3, and creatinine is 0.9. Her last echocardiogram showed an ejection fraction of 65%.  She maintains an active lifestyle, working outside every day, growing flowers, and engaging in activities such as butterfly walks and native plant walks. She uses a golf cart and a [employer] to assist with her outdoor activities. She expresses a passion for her outdoor work, stating, 'I literally work outside eight hours every day.'      Studies Reviewed: Jessica   EKG  Interpretation Date/Time:  Tuesday June 21 2024 15:39:31 EDT Ventricular Rate:  69 PR Interval:  182 QRS Duration:  70 QT Interval:  396 QTC Calculation: 424 R Axis:   23  Text Interpretation: Sinus rhythm with occasional Premature ventricular complexes When compared with ECG of 28-Jan-2021 13:34, Premature ventricular complexes are now Present Confirmed by Parchment Oneil ([id number]) on 06/21/2024 3:46:24 PM    Results LABS LDL: 70 Hb: 12.8 K: 4.3 Cr: 0.9  RADIOLOGY CT: normal post surgery  DIAGNOSTIC EKG: Sinus rhythm with rare PVCs Echocardiogram: EF 65% Risk Assessment/Calculations:            Physical Exam:   VS:  BP (!) 108/56   Pulse 69   Ht 5' 0.5 (1.537 m)   Wt 129 lb 3.2 oz (58.6 kg)   LMP  (LMP Unknown) Comment: tubal ligation  SpO2 98%   BMI 24.82 kg/m    Wt Readings from Last 3 Encounters:  06/21/24 129 lb 3.2 oz (58.6 kg)  05/10/24 128 lb 9.6 oz (58.3 kg)  03/31/24 130 lb 3.2 oz (59.1 kg)    GEN: Well nourished, well developed in no acute distress NECK: No JVD; No carotid bruits CARDIAC: RRR, no murmurs, no rubs, no gallops RESPIRATORY:  Clear to auscultation without rales, wheezing or rhonchi  ABDOMEN: Soft, non-tender, non-distended EXTREMITIES:  No edema; No deformity   ASSESSMENT AND PLAN: .    Assessment and Plan Assessment & Plan Paroxysmal supraventricular tachycardia No current symptoms of arrhythmia or sensation of palpitations. EKG shows normal rhythm with rare PVCs. Metoprolol  50  mg once daily is effectively managing the condition. - Continue metoprolol  50 mg once daily.  Paroxysmal atrial tachycardia No current symptoms or sensations of arrhythmia. Metoprolol  is effectively managing the condition. - Continue metoprolol  50 mg once daily.  Coronary artery calcification LAD calcification present. LDL is well-controlled at 70 mg/dL. Atorvastatin  is effectively managing hyperlipidemia. - Continue atorvastatin  20 mg daily. - Annual cardiac  review due to family history of CAD.  Deep vein thrombosis Currently on chronic anticoagulation with rivaroxaban  20 mg daily. - Continue rivaroxaban  20 mg daily maintenance therapy.  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 Previously managed with Herceptin  and ACE wraps.  Non-small cell lung cancer Stage Ia non-small cell lung cancer adenocarcinoma. Post-surgery CT appears normal.         Dispo: 1 yr  Signed, Oneil Parchment, MD

## 2024-06-26 ENCOUNTER — Other Ambulatory Visit: Payer: Self-pay | Admitting: Cardiology

## 2024-08-19 ENCOUNTER — Other Ambulatory Visit: Payer: Self-pay | Admitting: Cardiology

## 2024-08-22 ENCOUNTER — Other Ambulatory Visit: Payer: Self-pay

## 2024-08-22 MED ORDER — ATORVASTATIN CALCIUM 20 MG PO TABS: 20.0000 mg | ORAL_TABLET | Freq: Every day | ORAL | 3 refills | Status: AC

## 2024-08-22 NOTE — Telephone Encounter (Signed)
 Can we please send in enough medication with refills to get pt to next year appt. Thanks

## 2024-08-26 DIAGNOSIS — Z23 Encounter for immunization: Secondary | ICD-10-CM | POA: Diagnosis not present

## 2024-10-03 ENCOUNTER — Other Ambulatory Visit: Payer: Self-pay | Admitting: Medical Genetics

## 2024-10-03 DIAGNOSIS — Z006 Encounter for examination for normal comparison and control in clinical research program: Secondary | ICD-10-CM

## 2024-11-08 ENCOUNTER — Ambulatory Visit: Admitting: Allergy & Immunology

## 2024-11-09 LAB — GENECONNECT MOLECULAR SCREEN: Genetic Analysis Overall Interpretation: NEGATIVE

## 2024-11-22 ENCOUNTER — Ambulatory Visit: Admitting: Allergy & Immunology

## 2024-11-22 ENCOUNTER — Encounter: Payer: Self-pay | Admitting: Allergy & Immunology

## 2024-11-22 ENCOUNTER — Other Ambulatory Visit: Payer: Self-pay

## 2024-11-22 VITALS — BP 110/60 | HR 75 | Temp 98.1°F | Resp 18 | Ht 61.0 in | Wt 133.9 lb

## 2024-11-22 DIAGNOSIS — J3089 Other allergic rhinitis: Secondary | ICD-10-CM | POA: Diagnosis not present

## 2024-11-22 DIAGNOSIS — T7800XD Anaphylactic reaction due to unspecified food, subsequent encounter: Secondary | ICD-10-CM

## 2024-11-22 DIAGNOSIS — J4540 Moderate persistent asthma, uncomplicated: Secondary | ICD-10-CM | POA: Diagnosis not present

## 2024-11-22 DIAGNOSIS — J302 Other seasonal allergic rhinitis: Secondary | ICD-10-CM

## 2024-11-22 MED ORDER — FLUTICASONE FUROATE-VILANTEROL 100-25 MCG/ACT IN AEPB: 1.0000 | INHALATION_SPRAY | Freq: Every day | RESPIRATORY_TRACT | 2 refills | Status: AC

## 2024-11-22 MED ORDER — ALBUTEROL SULFATE HFA 108 (90 BASE) MCG/ACT IN AERS: INHALATION_SPRAY | RESPIRATORY_TRACT | 1 refills | Status: AC

## 2024-11-22 NOTE — Patient Instructions (Addendum)
 1. Mild persistent asthma, uncomplicated - Lung testing looks great today.  - I think your breathing is under good control.  - You have a good handle on your symptoms.  - Daily controller medication(s): NOTHING - Prior to physical activity: albuterol  2 puffs 10-15 minutes before physical activity. - Rescue medications: albuterol  4 puffs every 4-6 hours as needed - During respiratory flares: START Breo 200mcg one puff once daily for 1-2 weeks - Asthma control goals:  * Full participation in all desired activities (may need albuterol  before activity) * Albuterol  use two time or less a week on average (not counting use with activity) * Cough interfering with sleep two time or less a month * Oral steroids no more than once a year * No hospitalizations  2. Chronic allergic rhinitis (grasses, weeds, molds, dust mite, cat, dog, feathers, horse, cockroach) - Continue with Flonase  1 spray per nostril daily. - Continue with antihistamines daily as needed.  3. Intense pruritis - with alpha gal sensitivity as well as wheat, milk, tree nuts, egg, chickpea, sunflower, barley - Continue to avoid red meat as you are doing.  - Continue with avoidance of your other triggering foods including milk and chickpea. - We could consider adding on the Xolair monthly when it gets to the point where you are having to avoid these reactions (it might allow you to tolerate the lower IgE foods).  - You seem to have an excellent handle on your symptoms.  - I would love to get repeat levels at some point.   4. Return in about 6 months (around 05/23/2025). You can have the follow up appointment with Dr. Iva or a Nurse Practicioner (our Nurse Practitioners are excellent and always have Physician oversight!).     Please inform us  of any Emergency Department visits, hospitalizations, or changes in symptoms. Call us  before going to the ED for breathing or allergy  symptoms since we might be able to fit you in for a sick  visit. Feel free to contact us  anytime with any questions, problems, or concerns.  It was a pleasure to see you again today!  Your [place] story made me think of [place]:     Websites that have reliable patient information: 1. American Academy of Asthma, Allergy , and Immunology: www.aaaai.org 2. Food Allergy  Research and Education (FARE): foodallergy.org 3. Mothers of Asthmatics: http://www.asthmacommunitynetwork.org 4. [employer] of Allergy , Asthma, and Immunology: www.acaai.org      Like us  on [employer] and Instagram for our latest updates!      A healthy democracy works best when [employer] participate! Make sure you are registered to vote! If you have moved or changed any of your contact information, you will need to get this updated before voting! Scan the QR codes below to learn more!

## 2024-11-22 NOTE — Progress Notes (Signed)
 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 Mild persistent asthma, uncomplicated - Lung testing looks great today.  - I think your breathing is under good control.  - You have a good handle on your symptoms.  - Daily controller medication(s): NOTHING - Prior to physical activity: albuterol  2 puffs 10-15 minutes before physical activity. - Rescue medications: albuterol  4 puffs every 4-6 hours as needed - During respiratory flares: START Breo 200mcg one puff once daily for 1-2 weeks - Asthma control goals:  * Full participation in all desired activities (may need albuterol  before activity) * Albuterol  use two time or less a week on average (not counting use with activity) * Cough interfering with sleep two time or less a month * Oral steroids no more than once a year * No hospitalizations  2. Chronic allergic rhinitis (grasses, weeds, molds, dust mite, cat, dog, feathers, horse, cockroach) - Continue with Flonase  1 spray per nostril daily. - Continue with antihistamines daily as needed.  3. Intense pruritis - with alpha gal sensitivity as well as wheat, milk, tree nuts, egg, chickpea, sunflower, barley - Continue to avoid red meat as you are doing.  - Continue with avoidance of your other triggering foods including milk and chickpea. - We could consider adding on the Xolair monthly when it gets to the point where you are having to avoid these reactions (it might allow you to tolerate the lower IgE foods).  - You seem to have  an excellent handle on your symptoms.  - I would love to get repeat levels at some point.   4. Return in about 6 months (around 05/23/2025). You can have the follow up appointment with Dr. Iva or a Nurse Practicioner (our Nurse Practitioners are excellent and always have Physician oversight!).     Subjective:   Jessica Wells is a 78 y.o. female presenting today for follow up of  Chief Complaint  Patient presents with   Follow-up    No complaints     Jessica Wells has a history of the following: Patient Active Problem List   Diagnosis Date Noted   Genetic testing 10/08/2021   Family history of breast cancer 04/04/2021   Family history of lung cancer 04/04/2021   Adenocarcinoma of lung, stage 1, right (HCC) 02/02/2021   Seasonal and perennial allergic rhinitis 09/17/2017   S/P mastectomy 05/10/2014   Cancer of central portion of right female breast (HCC) 09/23/2013   Pulmonary nodule 01/03/2013   Acute respiratory failure with hypoxia (HCC) 12/29/2012   Hypokalemia 12/29/2012   Pulmonary embolism (HCC) 12/28/2012   Healthcare-associated pneumonia 12/27/2012   Acute respiratory distress 12/27/2012   Asthma with COPD (HCC)    GERD (gastroesophageal reflux disease)    Dysrhythmia    Allergy     Arthritis    Clotting disorder    History of chemotherapy    DVT (deep venous thrombosis) (HCC)    DVT of lower extremity, bilateral (HCC) 12/22/2012   PSVT (paroxysmal supraventricular tachycardia) 10/14/2012   PONV (postoperative  nausea and vomiting) 09/13/2012   PAT (paroxysmal atrial tachycardia)     History obtained from: chart review and patient.  Discussed the use of AI scribe software for clinical note transcription with the patient and/or guardian, who gave verbal consent to proceed.  Jessica Wells is a 78 y.o. female presenting for a follow up visit.  Patient was last seen in January 2025.  At that time, like testing looked excellent.  We continue with albuterol  as needed with  Breo added during flares.  For her rhinitis, she continued Flonase  as well as an antihistamine.  For her itching, she continue to avoid all of her triggering foods.  We did talk about Xolair to help with her itching.  Since last visit, she has done very well.   Asthma/Respiratory Symptom History: She recently returned from a cruise from [place] to [place], during which she developed a cold in the last couple of days. She managed her symptoms with Breo and Ventolin , which she finds essential for recovery from a cold. She experienced queasiness in the upper tracheal larynx but was able to taper off the medications after about ten days.  Allergic Rhinitis Symptom History: Allergic rhinitis is controlled with the use of Flonase  as well as an antihistamine. This seems to be working to control her symptoms.   Food Allergy  Symptom History: She is cautious about her diet due to her allergies and has found a bread from [employer] that does not contain barley. She is careful to avoid combining foods that she is allergic to, to prevent reactions. She continues to avoid all of her multiple allergens, including food allergies to nuts, barley, and sunflower seeds. She mentions a specific allergy  to macadamia nuts, which she can tolerate in small amounts, and a wheat allergy , which she can consume in moderation. She has experienced full body hives in the past, which she attributes to tick bites, and is aware of the alpha-gal syndrome, noting that she has had at least two tick bites in her life.  Her history of breast cancer is stable. She is stable and undergoes a CT scan once a year. She does not take tamoxifen or similar medications, as her treatment involved only resection.  She has a history of being stranded in [place] during 9/11, which has made her cautious about international travel. She was on business at the time and had to stay in [place] for seven days before being able to return home.   Otherwise, there have  been no changes to her past medical history, surgical history, family history, or social history.    Review of systems otherwise negative other than that mentioned in the HPI.    Objective:   Blood pressure 110/60, pulse 75, temperature 98.1 F (36.7 C), temperature source Temporal, resp. rate 18, height 5' 1 (1.549 m), weight 133 lb 14.4 oz (60.7 kg), SpO2 100%. Body mass index is 25.3 kg/m.    Physical Exam Vitals reviewed.  Constitutional:      Appearance: She is well-developed.     Comments: Bubbly female. Very lovely. Cooperative with the exam.   HENT:     Head: Normocephalic and atraumatic.     Right Ear: Tympanic membrane, ear canal and external ear normal.     Left Ear: Tympanic membrane, ear canal and external ear normal.     Nose: No nasal deformity, septal deviation, mucosal edema or rhinorrhea.     Right Turbinates: Enlarged, swollen and pale.     Left Turbinates: Enlarged, swollen and  pale.     Right Sinus: No maxillary sinus tenderness or frontal sinus tenderness.     Left Sinus: No maxillary sinus tenderness or frontal sinus tenderness.     Comments: No polyps noted.     Mouth/Throat:     Mouth: Mucous membranes are not pale and not dry.     Pharynx: Uvula midline.  Eyes:     General: Lids are normal. Allergic shiner present.        Right eye: No discharge.        Left eye: No discharge.     Conjunctiva/sclera: Conjunctivae normal.     Right eye: Right conjunctiva is not injected. No chemosis.    Left eye: Left conjunctiva is not injected. No chemosis.    Pupils: Pupils are equal, round, and reactive to light.  Cardiovascular:     Rate and Rhythm: Normal rate and regular rhythm.     Heart sounds: Normal heart sounds.  Pulmonary:     Effort: Pulmonary effort is normal. No tachypnea, accessory muscle usage or respiratory distress.     Breath sounds: Normal breath sounds. No wheezing, rhonchi or rales.     Comments: Moving air well in all lung  fields. Chest:     Chest wall: No tenderness.  Lymphadenopathy:     Cervical: No cervical adenopathy.  Skin:    General: Skin is warm.     Capillary Refill: Capillary refill takes less than 2 seconds.     Coloration: Skin is not pale.     Findings: No abrasion, erythema, petechiae or rash. Rash is not papular, urticarial or vesicular.     Comments: No eczematous or urticarial lesions noted.   Neurological:     Mental Status: She is alert.  Psychiatric:        Behavior: Behavior is cooperative.      Diagnostic studies:    Spirometry: results normal (FEV1: 1.51/89%, FVC: 2.01/91%, FEV1/FVC: 75%).    Spirometry consistent with normal pattern.    Allergy  Studies: none       Marty Shaggy, MD  [facility]        "

## 2025-03-21 ENCOUNTER — Other Ambulatory Visit

## 2025-04-04 ENCOUNTER — Ambulatory Visit: Admitting: Internal Medicine

## 2025-05-23 ENCOUNTER — Ambulatory Visit: Admitting: Allergy & Immunology
